# Patient Record
Sex: Female | Born: 1945
Health system: Southern US, Community
[De-identification: ages and names within clinical notes are randomized; demographics above are authoritative.]

## PROBLEM LIST (undated history)

## (undated) DIAGNOSIS — M1990 Unspecified osteoarthritis, unspecified site: Secondary | ICD-10-CM

## (undated) DIAGNOSIS — C4490 Unspecified malignant neoplasm of skin, unspecified: Secondary | ICD-10-CM

## (undated) DIAGNOSIS — Z8719 Personal history of other diseases of the digestive system: Secondary | ICD-10-CM

## (undated) DIAGNOSIS — I2510 Atherosclerotic heart disease of native coronary artery without angina pectoris: Secondary | ICD-10-CM

## (undated) DIAGNOSIS — E785 Hyperlipidemia, unspecified: Secondary | ICD-10-CM

## (undated) DIAGNOSIS — Z720 Tobacco use: Secondary | ICD-10-CM

## (undated) DIAGNOSIS — K8020 Calculus of gallbladder without cholecystitis without obstruction: Secondary | ICD-10-CM

## (undated) DIAGNOSIS — F419 Anxiety disorder, unspecified: Secondary | ICD-10-CM

## (undated) DIAGNOSIS — C801 Malignant (primary) neoplasm, unspecified: Secondary | ICD-10-CM

## (undated) DIAGNOSIS — T7840XA Allergy, unspecified, initial encounter: Secondary | ICD-10-CM

## (undated) DIAGNOSIS — E042 Nontoxic multinodular goiter: Secondary | ICD-10-CM

## (undated) DIAGNOSIS — N183 Chronic kidney disease, stage 3 unspecified: Secondary | ICD-10-CM

## (undated) DIAGNOSIS — K219 Gastro-esophageal reflux disease without esophagitis: Secondary | ICD-10-CM

## (undated) DIAGNOSIS — K449 Diaphragmatic hernia without obstruction or gangrene: Secondary | ICD-10-CM

## (undated) DIAGNOSIS — M419 Scoliosis, unspecified: Secondary | ICD-10-CM

## (undated) DIAGNOSIS — F329 Major depressive disorder, single episode, unspecified: Secondary | ICD-10-CM

## (undated) DIAGNOSIS — M810 Age-related osteoporosis without current pathological fracture: Secondary | ICD-10-CM

## (undated) DIAGNOSIS — I6523 Occlusion and stenosis of bilateral carotid arteries: Secondary | ICD-10-CM

## (undated) DIAGNOSIS — F32A Depression, unspecified: Secondary | ICD-10-CM

## (undated) DIAGNOSIS — C50919 Malignant neoplasm of unspecified site of unspecified female breast: Secondary | ICD-10-CM

## (undated) DIAGNOSIS — Z85828 Personal history of other malignant neoplasm of skin: Secondary | ICD-10-CM

## (undated) DIAGNOSIS — I10 Essential (primary) hypertension: Secondary | ICD-10-CM

## (undated) DIAGNOSIS — Z972 Presence of dental prosthetic device (complete) (partial): Secondary | ICD-10-CM

## (undated) HISTORY — PX: EYE SURGERY: SHX253

## (undated) HISTORY — DX: Malignant (primary) neoplasm, unspecified: C80.1

## (undated) HISTORY — DX: Nontoxic multinodular goiter: E04.2

## (undated) HISTORY — DX: Gastro-esophageal reflux disease without esophagitis: K21.9

## (undated) HISTORY — DX: Unspecified malignant neoplasm of skin, unspecified: C44.90

## (undated) HISTORY — PX: HAND SURGERY: SHX662

## (undated) HISTORY — DX: Chronic kidney disease, stage 3 unspecified: N18.30

## (undated) HISTORY — PX: COLONOSCOPY: SHX174

## (undated) HISTORY — DX: Atherosclerotic heart disease of native coronary artery without angina pectoris: I25.10

## (undated) HISTORY — DX: Age-related osteoporosis without current pathological fracture: M81.0

## (undated) HISTORY — DX: Unspecified osteoarthritis, unspecified site: M19.90

## (undated) HISTORY — DX: Hyperlipidemia, unspecified: E78.5

## (undated) HISTORY — DX: Occlusion and stenosis of bilateral carotid arteries: I65.23

## (undated) HISTORY — DX: Depression, unspecified: F32.A

## (undated) HISTORY — PX: TUBAL LIGATION: SHX77

## (undated) HISTORY — DX: Allergy, unspecified, initial encounter: T78.40XA

## (undated) HISTORY — DX: Personal history of other malignant neoplasm of skin: Z85.828

## (undated) HISTORY — PX: TONSILECTOMY, ADENOIDECTOMY, BILATERAL MYRINGOTOMY AND TUBES: SHX2538

## (undated) HISTORY — DX: Tobacco use: Z72.0

## (undated) HISTORY — DX: Essential (primary) hypertension: I10

## (undated) HISTORY — DX: Major depressive disorder, single episode, unspecified: F32.9

---

## 2002-10-27 ENCOUNTER — Other Ambulatory Visit: Admission: RE | Admit: 2002-10-27 | Discharge: 2002-10-27 | Payer: Self-pay | Admitting: Obstetrics and Gynecology

## 2003-12-03 ENCOUNTER — Ambulatory Visit (HOSPITAL_COMMUNITY): Admission: RE | Admit: 2003-12-03 | Discharge: 2003-12-03 | Payer: Self-pay | Admitting: Endocrinology

## 2004-12-15 ENCOUNTER — Other Ambulatory Visit: Admission: RE | Admit: 2004-12-15 | Discharge: 2004-12-15 | Payer: Self-pay | Admitting: Obstetrics and Gynecology

## 2005-11-21 ENCOUNTER — Ambulatory Visit: Payer: Self-pay

## 2005-11-27 ENCOUNTER — Ambulatory Visit: Payer: Self-pay

## 2007-01-02 ENCOUNTER — Ambulatory Visit (HOSPITAL_COMMUNITY): Admission: RE | Admit: 2007-01-02 | Discharge: 2007-01-02 | Payer: Self-pay | Admitting: Endocrinology

## 2007-01-02 ENCOUNTER — Ambulatory Visit: Payer: Self-pay | Admitting: Vascular Surgery

## 2008-09-06 DIAGNOSIS — Z85828 Personal history of other malignant neoplasm of skin: Secondary | ICD-10-CM | POA: Insufficient documentation

## 2008-09-06 DIAGNOSIS — I10 Essential (primary) hypertension: Secondary | ICD-10-CM | POA: Insufficient documentation

## 2008-09-07 ENCOUNTER — Ambulatory Visit: Payer: Self-pay | Admitting: Critical Care Medicine

## 2008-09-07 DIAGNOSIS — J449 Chronic obstructive pulmonary disease, unspecified: Secondary | ICD-10-CM | POA: Insufficient documentation

## 2008-09-09 DIAGNOSIS — J310 Chronic rhinitis: Secondary | ICD-10-CM | POA: Insufficient documentation

## 2008-09-14 ENCOUNTER — Encounter: Payer: Self-pay | Admitting: Cardiology

## 2008-09-14 ENCOUNTER — Ambulatory Visit: Payer: Self-pay | Admitting: Cardiology

## 2008-09-16 ENCOUNTER — Ambulatory Visit: Payer: Self-pay | Admitting: Cardiology

## 2008-09-16 LAB — CONVERTED CEMR LAB
BUN: 13 mg/dL (ref 6–23)
Basophils Absolute: 0 10*3/uL (ref 0.0–0.1)
Basophils Relative: 0.2 % (ref 0.0–3.0)
CO2: 30 meq/L (ref 19–32)
Calcium: 9.2 mg/dL (ref 8.4–10.5)
Chloride: 106 meq/L (ref 96–112)
Creatinine, Ser: 0.8 mg/dL (ref 0.4–1.2)
Eosinophils Absolute: 0.1 10*3/uL (ref 0.0–0.7)
Eosinophils Relative: 1.3 % (ref 0.0–5.0)
GFR calc non Af Amer: 76.57 mL/min (ref >60)
Glucose, Bld: 99 mg/dL (ref 70–99)
HCT: 42.3 % (ref 36.0–46.0)
Hemoglobin: 14.3 g/dL (ref 12.0–15.0)
INR: 1 (ref 0.8–1.0)
Lymphocytes Relative: 37.1 % (ref 12.0–46.0)
Lymphs Abs: 2.9 10*3/uL (ref 0.7–4.0)
MCHC: 33.8 g/dL (ref 30.0–36.0)
MCV: 96.8 fL (ref 78.0–100.0)
Monocytes Absolute: 0.5 10*3/uL (ref 0.1–1.0)
Monocytes Relative: 5.8 % (ref 3.0–12.0)
Neutro Abs: 4.4 10*3/uL (ref 1.4–7.7)
Neutrophils Relative %: 55.6 % (ref 43.0–77.0)
Platelets: 247 10*3/uL (ref 150.0–400.0)
Potassium: 4.4 meq/L (ref 3.5–5.1)
Prothrombin Time: 11.1 s (ref 10.9–13.3)
RBC: 4.37 M/uL (ref 3.87–5.11)
RDW: 13.6 % (ref 11.5–14.6)
Sodium: 143 meq/L (ref 135–145)
WBC: 7.9 10*3/uL (ref 4.5–10.5)

## 2008-09-20 ENCOUNTER — Ambulatory Visit: Payer: Self-pay | Admitting: Cardiology

## 2008-09-20 ENCOUNTER — Inpatient Hospital Stay (HOSPITAL_BASED_OUTPATIENT_CLINIC_OR_DEPARTMENT_OTHER): Admission: RE | Admit: 2008-09-20 | Discharge: 2008-09-20 | Payer: Self-pay | Admitting: Cardiology

## 2008-09-21 ENCOUNTER — Telehealth: Payer: Self-pay | Admitting: Cardiology

## 2008-09-28 ENCOUNTER — Encounter: Payer: Self-pay | Admitting: Cardiology

## 2008-09-28 ENCOUNTER — Ambulatory Visit: Payer: Self-pay | Admitting: Cardiology

## 2008-09-28 DIAGNOSIS — E785 Hyperlipidemia, unspecified: Secondary | ICD-10-CM | POA: Insufficient documentation

## 2008-09-28 DIAGNOSIS — I2510 Atherosclerotic heart disease of native coronary artery without angina pectoris: Secondary | ICD-10-CM | POA: Insufficient documentation

## 2008-10-07 ENCOUNTER — Ambulatory Visit: Payer: Self-pay | Admitting: Critical Care Medicine

## 2008-11-09 ENCOUNTER — Ambulatory Visit: Payer: Self-pay | Admitting: Cardiology

## 2008-11-16 LAB — CONVERTED CEMR LAB
ALT: 11 units/L (ref 0–35)
AST: 19 units/L (ref 0–37)
Albumin: 3.8 g/dL (ref 3.5–5.2)
Alkaline Phosphatase: 72 units/L (ref 39–117)
Bilirubin, Direct: 0.1 mg/dL (ref 0.0–0.3)
Cholesterol: 132 mg/dL (ref 0–200)
HDL: 47.6 mg/dL (ref >39.00)
LDL Cholesterol: 66 mg/dL (ref 0–99)
Total Bilirubin: 1 mg/dL (ref 0.3–1.2)
Total CHOL/HDL Ratio: 3
Total Protein: 6.9 g/dL (ref 6.0–8.3)
Triglycerides: 91 mg/dL (ref 0.0–149.0)
VLDL: 18.2 mg/dL (ref 0.0–40.0)

## 2009-01-14 ENCOUNTER — Ambulatory Visit: Payer: Self-pay | Admitting: Critical Care Medicine

## 2009-05-31 ENCOUNTER — Encounter: Admission: RE | Admit: 2009-05-31 | Discharge: 2009-05-31 | Payer: Self-pay | Admitting: Endocrinology

## 2009-07-12 ENCOUNTER — Ambulatory Visit: Payer: Self-pay | Admitting: Critical Care Medicine

## 2009-07-12 DIAGNOSIS — S2239XA Fracture of one rib, unspecified side, initial encounter for closed fracture: Secondary | ICD-10-CM | POA: Insufficient documentation

## 2009-10-04 ENCOUNTER — Encounter: Admission: RE | Admit: 2009-10-04 | Discharge: 2009-10-04 | Payer: Self-pay | Admitting: Endocrinology

## 2009-10-04 ENCOUNTER — Ambulatory Visit: Payer: Self-pay | Admitting: Cardiology

## 2009-10-04 DIAGNOSIS — F17200 Nicotine dependence, unspecified, uncomplicated: Secondary | ICD-10-CM | POA: Insufficient documentation

## 2010-04-19 ENCOUNTER — Encounter: Admission: RE | Admit: 2010-04-19 | Discharge: 2010-04-19 | Payer: Self-pay | Admitting: Endocrinology

## 2010-07-11 NOTE — Assessment & Plan Note (Signed)
Summary: Pulmonary OV   Copy to:  Dr. Marcelle Overlie Primary Ryan Palermo/Referring Zai Chmiel:  Dr. Corrin Parker  CC:  3 month followup.  Pt c/o dry cough in the am and drainage at bedtime x 1 wk.  She states that breathing is a little better.  Marland Kitchen  History of Present Illness: Pulmonary OV       This is a 65 year old woman with Golds stage III COPD   January 14, 2009 4:38 PM Doing fair except notes pn drip.  Has a dry cough but no edema in feet.  Overall is at baseline. Pt denies any significant sore throat, nasal congestion or excess secretions, fever, chills, sweats, unintended weight loss, pleurtic or exertional chest pain, orthopnea PND, or leg swelling Pt denies any increase in rescue therapy over baseline, denies waking up needing it or having any early am or nocturnal exacerbations of coughing/wheezing/or dyspnea.  October 07, 2008 3:47 PM We started spiriva at last ov.  Pt had heart cath.  No stents placed.  Now exercising more.  No new issues. less dyspnea.     Preventive Screening-Counseling & Management  Alcohol-Tobacco     Smoking Status: quit > 6 months  Current Medications (verified): 1)  Crestor 20 Mg Tabs (Rosuvastatin Calcium) .... Take One Tablet By Mouth Daily. 2)  Inderal La 80 Mg Xr24h-Cap (Propranolol Hcl) .... 1/2 By Mouth Daily 3)  Maxzide-25 37.5-25 Mg Tabs (Triamterene-Hctz) .... 1/2 Tab Every Other Day 4)  Clonazepam 0.5 Mg Tabs (Clonazepam) .Marland Kitchen.. 1 By Mouth Daily 5)  Prilosec Otc 20 Mg Tbec (Omeprazole Magnesium) .Marland Kitchen.. 1 Once Daily 6)  Spiriva Handihaler 18 Mcg  Caps (Tiotropium Bromide Monohydrate) .... Two Puffs in Handihaler Daily 7)  Lexapro 10 Mg Tabs (Escitalopram Oxalate) .Marland Kitchen.. 1 Tab Once Daily 8)  Amlodipine Besylate 5 Mg Tabs (Amlodipine Besylate) .... 1/2 Tab Once Daily 9)  Multivitamins   Tabs (Multiple Vitamin) .Marland Kitchen.. 1 Tab Once Daily  Allergies (verified): 1)  ! Pcn 2)  ! Sulfa 3)  ! Flagyl 4)  ! Ceclor 5)  ! Naprosyn  Past  History:  Past medical, surgical, family and social histories (including risk factors) reviewed, and no changes noted (except as noted below).  Past Medical History: Reviewed history from 09/24/2008 and no changes required. HYPERTENSION (ICD-401.9) EMPHYSEMA (ICD-492.8) PAINFUL RESPIRATION (ICD-786.52) SKIN CANCER, HX OF (ICD-V10.83) ALLERGIC RHINITIS (ICD-477.9)    Past Surgical History: Reviewed history from 09/07/2008 and no changes required. Tubal Ligation--1974 Tonsillectomy  Family History: Reviewed history from 09/07/2008 and no changes required. Heart Disease--5 brothers and 2 sisters Arthritis--mother Diverticulosis--sister Pancreatic cancer  Social History: Reviewed history from 09/07/2008 and no changes required. Married Patient states former smoker.  retired Merchandiser, retail trucking [employer]  Review of Systems       The patient complains of shortness of breath with activity, non-productive cough, and nasal congestion/difficulty breathing through nose.  The patient denies shortness of breath at rest, productive cough, coughing up blood, chest pain, irregular heartbeats, acid heartburn, indigestion, loss of appetite, weight change, abdominal pain, difficulty swallowing, sore throat, tooth/dental problems, headaches, sneezing, itching, ear ache, anxiety, depression, hand/feet swelling, joint stiffness or pain, rash, change in color of mucus, and fever.    Vital Signs:  Patient profile:   65 year old female Weight:      175.25 pounds O2 Sat:      100 % on Room air Temp:     98.2 degrees F oral Pulse rate:   60 / minute  BP sitting:   122 / 70  (left arm)  Vitals Entered By: Vernie Murders (January 14, 2009 4:16 PM)  O2 Flow:  Room air  Physical Exam  Additional Exam:  Gen: Pleasant, well-nourished, in no distress,  normal affect ENT: No lesions,  mouth clear,  oropharynx clear, no postnasal drip Neck: No JVD, no TMG, no carotid bruits Lungs: No use of accessory muscles,  tender to palpation L chest wall,  distant BS,  prolonged expiratory phase,  no wheeze or rales Cardiovascular: RRR, heart sounds normal, no murmur or gallops, no peripheral edema Abdomen: soft and NT, no HSM,  BS normal Musculoskeletal: No deformities, no cyanosis or clubbing Neuro: alert, non focal Skin: Warm, no lesions or rashes    Impression & Recommendations:  Problem # 1:  EMPHYSEMA (ICD-492.8) Assessment Unchanged Copd Golds stage III, associated chronic rhinitis  plan cont current inhaled medications  trial veramyst two sprays ea nostril daily  no need for supp oxygen at this time   Medications Added to Medication List This Visit: 1)  Prilosec Otc 20 Mg Tbec (Omeprazole magnesium) .Marland Kitchen.. 1 once daily  Complete Medication List: 1)  Crestor 20 Mg Tabs (Rosuvastatin calcium) .... Take one tablet by mouth daily. 2)  Inderal La 80 Mg Xr24h-cap (Propranolol hcl) .... 1/2 by mouth daily 3)  Maxzide-25 37.5-25 Mg Tabs (Triamterene-hctz) .... 1/2 tab every other day 4)  Clonazepam 0.5 Mg Tabs (Clonazepam) .Marland Kitchen.. 1 by mouth daily 5)  Prilosec Otc 20 Mg Tbec (Omeprazole magnesium) .Marland Kitchen.. 1 once daily 6)  Spiriva Handihaler 18 Mcg Caps (Tiotropium bromide monohydrate) .... Two puffs in handihaler daily 7)  Lexapro 10 Mg Tabs (Escitalopram oxalate) .Marland Kitchen.. 1 tab once daily 8)  Amlodipine Besylate 5 Mg Tabs (Amlodipine besylate) .... 1/2 tab once daily 9)  Multivitamins Tabs (Multiple vitamin) .Marland Kitchen.. 1 tab once daily  Other Orders: Est. Patient Level IV (16109)  Patient Instructions: 1)  Return 4 months 2)  Trial veramyst two puff daily each nostril 3)  No change in medications Prescriptions: SPIRIVA HANDIHALER 18 MCG  CAPS (TIOTROPIUM BROMIDE MONOHYDRATE) Two puffs in handihaler daily Brand medically necessary #90 x 4   Entered and Authorized by:   Storm Frisk MD   Signed by:   Storm Frisk MD on 01/14/2009   Method used:   Faxed to ...       [facility]* (mail-order)              ,          Ph: [phone]       Fax: [phone]   RxID:   [id number]   Appended Document: Pulmonary OV fax [place] Pert

## 2010-07-11 NOTE — Assessment & Plan Note (Signed)
Summary: Pulmonary OV   Copy to:  Dr. Marcelle Overlie Primary Provider/Referring Provider:  Dr. Corrin Parker  CC:  1 month follow-up with PFT's and .  Pt still c/o sob with exertion.Marland Kitchen  History of Present Illness: Pulmonary Consultation       This is a 65 year old woman with Golds stage III COPD This pt originally fell out of her bed 7/09 hitting L side of chest wall.  Had severe pain and rib fx seen on xray.  Spirometry then was abn.  No meds given.  Pt then stopped smoking 10/09.  Over time has developed more dyspnea with exertion not at rest.  Worse up hill or steps.  Denies any cough.  Legs are heavy.  Just got over a spell of sinusitis and bronchitis rx with zpak.    Currently:  dyspneic up steps, if plays wiht grandchildren, walking level ground > 49yrds.  Still pain L side of chest if cough, twist or turn.  Cannot sleep with L side down.  No mucous now.    October 07, 2008 3:47 PM We started spiriva at last ov.  Pt had heart cath.  No stents placed.  Now exercising more.  No new issues. less dyspnea.    Current Medications (verified): 1)  Crestor 20 Mg Tabs (Rosuvastatin Calcium) .... Take One Tablet By Mouth Daily. 2)  Inderal La 80 Mg Xr24h-Cap (Propranolol Hcl) .... 1/2 By Mouth Daily 3)  Maxzide-25 37.5-25 Mg Tabs (Triamterene-Hctz) .... 1/2 Tab Every Other Day 4)  Clonazepam 0.5 Mg Tabs (Clonazepam) .Marland Kitchen.. 1 By Mouth Daily 5)  Zegerid 40-1100 Mg Caps (Omeprazole-Sodium Bicarbonate) .Marland Kitchen.. 1 By Mouth Daily 6)  Spiriva Handihaler 18 Mcg  Caps (Tiotropium Bromide Monohydrate) .... Two Puffs in Handihaler Daily 7)  Lexapro 10 Mg Tabs (Escitalopram Oxalate) .Marland Kitchen.. 1 Tab Once Daily 8)  Amlodipine Besylate 5 Mg Tabs (Amlodipine Besylate) .... 1/2 Tab Once Daily 9)  Multivitamins   Tabs (Multiple Vitamin) .Marland Kitchen.. 1 Tab Once Daily  Allergies (verified): 1)  ! Pcn 2)  ! Sulfa 3)  ! Flagyl 4)  ! Ceclor 5)  ! Naprosyn  Past History:  Past Medical History:    Reviewed history  from 09/24/2008 and no changes required:    HYPERTENSION (ICD-401.9)    EMPHYSEMA (ICD-492.8)    PAINFUL RESPIRATION (ICD-786.52)    SKIN CANCER, HX OF (ICD-V10.83)    ALLERGIC RHINITIS (ICD-477.9)       Review of Systems       The patient complains of shortness of breath with activity and shortness of breath at rest.  The patient denies productive cough, non-productive cough, coughing up blood, chest pain, irregular heartbeats, acid heartburn, indigestion, loss of appetite, weight change, abdominal pain, difficulty swallowing, sore throat, tooth/dental problems, headaches, nasal congestion/difficulty breathing through nose, sneezing, itching, ear ache, anxiety, depression, hand/feet swelling, joint stiffness or pain, rash, change in color of mucus, and fever.    Vital Signs:  Patient profile:   65 year old female Height:      62 inches (157.48 cm) Weight:      170 pounds (77.27 kg) BMI:     31.21 O2 Sat:      95 % Temp:     98.0 degrees F (36.67 degrees C) oral Pulse rate:   61 / minute BP sitting:   130 / 80  (left arm) Cuff size:   regular  Vitals Entered By: Michel Bickers CMA (October 07, 2008 3:43 PM)  O2 Sat at  Rest %:  95 O2 Flow:  room air  Physical Exam  Additional Exam:  Gen: Pleasant, well-nourished, in no distress,  normal affect ENT: No lesions,  mouth clear,  oropharynx clear, no postnasal drip Neck: No JVD, no TMG, no carotid bruits Lungs: No use of accessory muscles, tender to palpation L chest wall,  distant BS,  prolonged expiratory phase,  no wheeze or rales Cardiovascular: RRR, heart sounds normal, no murmur or gallops, no peripheral edema Abdomen: soft and NT, no HSM,  BS normal Musculoskeletal: No deformities, no cyanosis or clubbing Neuro: alert, non focal Skin: Warm, no lesions or rashes    Pulmonary Function Test Date: 10/07/2008 Gender: Female  Pre-Spirometry FVC    Value: 2.32 L/min   Pred: 2.74 L/min     % Pred: 85 % FEV1    Value: 1.72 L      Pred: 1.99 L     % Pred: 87 % FEV1/FVC  Value: 74 %     Pred: 73 %    FEF 25-75  Value: 1.26 L/min   Pred: 2.35 L/min     % Pred: 53 %  Post-Spirometry FVC    Value: 2.27 L/min   Pred: 2.74 L/min     % Pred: 83 % FEV1    Value: 1.67 L     Pred: 1.99 L     % Pred: 84 % FEV1/FVC  Value: 73 %     Pred: 73 %    FEF 25-75  Value: 1.18 L/min   Pred: 2.35 L/min     % Pred: 50 %  Lung Volumes TLC    Value: 4.08 L   % Pred: 92 % RV    Value: 1.76 L   % Pred: 103 % DLCO    Value: 15.2 %   % Pred: 74 % DLCO/VA  Value: 4.13 %   % Pred: 113 %  Comments: moderate peripheral airflow obstruction,  mild reduction in DLCO  Impression & Recommendations:  Problem # 1:  EMPHYSEMA (ICD-492.8) Assessment Unchanged Copd Golds stage III  plan cont current inhaled medications  no need for supp oxygen at this time   Complete Medication List: 1)  Crestor 20 Mg Tabs (Rosuvastatin calcium) .... Take one tablet by mouth daily. 2)  Inderal La 80 Mg Xr24h-cap (Propranolol hcl) .... 1/2 by mouth daily 3)  Maxzide-25 37.5-25 Mg Tabs (Triamterene-hctz) .... 1/2 tab every other day 4)  Clonazepam 0.5 Mg Tabs (Clonazepam) .Marland Kitchen.. 1 by mouth daily 5)  Zegerid 40-1100 Mg Caps (Omeprazole-sodium bicarbonate) .Marland Kitchen.. 1 by mouth daily 6)  Spiriva Handihaler 18 Mcg Caps (Tiotropium bromide monohydrate) .... Two puffs in handihaler daily 7)  Lexapro 10 Mg Tabs (Escitalopram oxalate) .Marland Kitchen.. 1 tab once daily 8)  Amlodipine Besylate 5 Mg Tabs (Amlodipine besylate) .... 1/2 tab once daily 9)  Multivitamins Tabs (Multiple vitamin) .Marland Kitchen.. 1 tab once daily  Other Orders: Est. Patient Level III (42595)  Patient Instructions: 1)  No change in medications 2)  Return 4 months  Appended Document: Pulmonary OV fax Corrin Parker and Marcelle Overlie

## 2010-07-11 NOTE — Assessment & Plan Note (Signed)
Summary: Review Paper Chart for Dictation   CC:  Occasion Chest Pain/Pressure: DOE.  Current Medications (verified): 1)  Crestor 5 Mg Tabs (Rosuvastatin Calcium) .Marland Kitchen.. 1 By Mouth Daily 2)  Inderal La 80 Mg Xr24h-Cap (Propranolol Hcl) .... 1/2 By Mouth Daily 3)  Maxzide-25 37.5-25 Mg Tabs (Triamterene-Hctz) .... 1/2 By Mouth Daily 4)  Clonazepam 0.5 Mg Tabs (Clonazepam) .Marland Kitchen.. 1 By Mouth Daily 5)  Zegerid 40-1100 Mg Caps (Omeprazole-Sodium Bicarbonate) .Marland Kitchen.. 1 By Mouth Daily 6)  Spiriva Handihaler 18 Mcg  Caps (Tiotropium Bromide Monohydrate) .... Two Puffs in Handihaler Daily  Allergies: 1)  ! Pcn 2)  ! Sulfa 3)  ! Flagyl 4)  ! Ceclor 5)  ! Naprosyn  Vital Signs:  Patient profile:   65 year old female Height:      61 inches Weight:      172 pounds Pulse rate:   56 / minute BP sitting:   149 / 72  (left arm)  Vitals Entered By: Stanton Kidney, EMT-P (September 14, 2008 4:13 PM)

## 2010-07-11 NOTE — Assessment & Plan Note (Signed)
Summary: Pulmonary Consultation   Copy to:  Dr. Marcelle Overlie Primary Provider/Referring Provider:  Dr. Corrin Parker  CC:  Pulmonary consult for dyspnea/emphysema. and COPD initial evaluation.  History of Present Illness: Pulmonary Consultation       This is a 65 year old woman who presents for COPD initial evaluation.  The patient complains of shortness of breath, chest tightness, chest pain worse with breathing and coughing, and mucous production, but denies wheezing, cough, nocturnal awakening, exercise induced symptoms, and congestion.  Prior evaluation and testing has included Cxr and simple PFT.  The dyspnea is described as with walking one or two blocks, with walking stairs, with walking from the car to building, with walking to the mailbox and back, and during the day.  Associated disease(s) include(s) indigestion, sneezing, nasal congestion, chest pain, and chest tightness.  Oxygen evaluation is described as not on supplemental O2.   This pt originally fell out of her bed 7/09 hitting L side of chest wall.  Had severe pain and rib fx seen on xray.  Spirometry then was abn.  No meds given.  Pt then stopped smoking 10/09.  Over time has developed more dyspnea with exertion not at rest.  Worse up hill or steps.  Denies any cough.  Legs are heavy.  Just got over a spell of sinusitis and bronchitis rx with zpak.    Currently:  dyspneic up steps, if plays wiht grandchildren, walking level ground > 85yrds.  Still pain L side of chest if cough, twist or turn.  Cannot sleep with L side down.  No mucous now.  Not on any inhalers now.  Here for pulm consult.    Preventive Screening-Counseling & Management     Smoking Status: quit > 6 months     Year Started: age 11     Year Quit: 2009     Pack years: 21  Current Medications (verified): 1)  Crestor 5 Mg Tabs (Rosuvastatin Calcium) .Marland Kitchen.. 1 By Mouth Daily 2)  Inderal La 80 Mg Xr24h-Cap (Propranolol Hcl) .... 1/2 By Mouth Daily 3)   Maxzide-25 37.5-25 Mg Tabs (Triamterene-Hctz) .... 1/2 By Mouth Daily 4)  Clonazepam 0.5 Mg Tabs (Clonazepam) .Marland Kitchen.. 1 By Mouth Daily 5)  Zegerid 40-1100 Mg Caps (Omeprazole-Sodium Bicarbonate) .Marland Kitchen.. 1 By Mouth Daily  Allergies (verified): 1)  ! Pcn 2)  ! Sulfa 3)  ! Flagyl 4)  ! Ceclor  Past History:  Past Medical History:    Current Problems:     EMPHYSEMA (ICD-492.8)    SKIN CANCER, HX OF (ICD-V10.83)    HYPERTENSION (ICD-401.9)    Allergic Rhinitis  Past Surgical History:    Tubal Ligation--1974    Tonsillectomy  Family History:    Reviewed history from 09/06/2008 and no changes required:       Heart Disease--5 brothers and 2 sisters       Arthritis--mother       Diverticulosis--sister       Pancreatic cancer  Social History:    Reviewed history from 09/06/2008 and no changes required:       Married       Patient states former smoker.        retired Merchandiser, retail trucking [employer]    Smoking Status:  quit > 6 months    Pack years:  21  Review of Systems       The patient complains of shortness of breath with activity, chest pain, acid heartburn, nasal congestion/difficulty breathing through nose, sneezing, anxiety, and hand/feet  swelling.  The patient denies shortness of breath at rest, productive cough, non-productive cough, coughing up blood, irregular heartbeats, indigestion, loss of appetite, weight change, abdominal pain, difficulty swallowing, sore throat, tooth/dental problems, headaches, itching, ear ache, depression, joint stiffness or pain, rash, change in color of mucus, and fever.        See HPI for Pulmonary, Cardiac, General, and ENT review of systems.  Vital Signs:  Patient profile:   65 year old female Height:      62 inches (157.48 cm) Weight:      173.50 pounds (78.86 kg) BMI:     31.85 O2 Sat:      98 % Temp:     98.1 degrees F (36.72 degrees C) oral Pulse rate:   63 / minute BP sitting:   140 / 80  (left arm) Cuff size:   regular  Vitals Entered By:  Michel Bickers CMA (September 07, 2008 8:50 AM)  O2 Sat at Rest %:  98 O2 Flow:  room air CC: Pulmonary consult for dyspnea/emphysema., COPD initial evaluation Is Patient Diabetic? No   Physical Exam  Additional Exam:  Gen: Pleasant, well-nourished, in no distress,  normal affect ENT: No lesions,  mouth clear,  oropharynx clear, no postnasal drip Neck: No JVD, no TMG, no carotid bruits Lungs: No use of accessory muscles, tender to palpation L chest wall,  distant BS,  prolonged expiratory phase,  no wheeze or rales Cardiovascular: RRR, heart sounds normal, no murmur or gallops, no peripheral edema Abdomen: soft and NT, no HSM,  BS normal Musculoskeletal: No deformities, no cyanosis or clubbing Neuro: alert, non focal Skin: Warm, no lesions or rashes    CXR  Procedure date:  09/07/2008  Findings:        LEFT RIBS - 2 VIEW   Comparison: Chest 09/07/2008.   Findings: There is no rib fracture.  Degenerative disease of the left acromioclavicular joint noted.   IMPRESSION: Negative for fracture.  CHEST - 2 VIEW   Comparison: None   Findings: Heart and mediastinal contours are within normal limits. No focal opacities or effusions.  No acute bony abnormality.   IMPRESSION: No active disease.  Impression & Recommendations:  Problem # 1:  EMPHYSEMA (ICD-492.8) Assessment Deteriorated Severe COPD with primary emphysematous component,  CXR without acute process.  No pfts for eval.    plan: full pfts and 6 minute walk start spiriva  Problem # 2:  PAINFUL RESPIRATION (ZOX-096.04) Assessment: Unchanged Tender chest wall s/p fall,  no overt abn on cxr or rib details, suspect chronic pleurisy/ chest wall inflammation  plan: trial aleve/motrin on schedule  Medications Added to Medication List This Visit: 1)  Zegerid 40-1100 Mg Caps (Omeprazole-sodium bicarbonate) .Marland Kitchen.. 1 by mouth daily 2)  Spiriva Handihaler 18 Mcg Caps (Tiotropium bromide monohydrate) .... Two puffs in  handihaler daily 3)  Neurontin 300 Mg Caps (Gabapentin) .... One by mouth two times a day  Complete Medication List: 1)  Crestor 5 Mg Tabs (Rosuvastatin calcium) .Marland Kitchen.. 1 by mouth daily 2)  Inderal La 80 Mg Xr24h-cap (Propranolol hcl) .... 1/2 by mouth daily 3)  Maxzide-25 37.5-25 Mg Tabs (Triamterene-hctz) .... 1/2 by mouth daily 4)  Clonazepam 0.5 Mg Tabs (Clonazepam) .Marland Kitchen.. 1 by mouth daily 5)  Zegerid 40-1100 Mg Caps (Omeprazole-sodium bicarbonate) .Marland Kitchen.. 1 by mouth daily 6)  Spiriva Handihaler 18 Mcg Caps (Tiotropium bromide monohydrate) .... Two puffs in handihaler daily 7)  Neurontin 300 Mg Caps (Gabapentin) .... One by mouth two times  a day  Other Orders: New Patient Level V [phone]) Prescription Created Electronically [phone]) HFA Instruction [phone]) Pulmonary Referral (Pulmonary) T-Ribs Unilateral 2 Views (71100TC) T-2 View CXR, Same Day (71020.5TC)  Patient Instructions: 1)  Get pulmonary function studies and 6 minute walk test 2)  Chest xray and rib films today 3)  Start Spiriva daily one capsule two puffs daily 4)  Return one month Prescriptions: NEURONTIN 300 MG CAPS (GABAPENTIN) one by mouth two times a day Brand medically necessary #60 x 1   Entered and Authorized by:   Storm Frisk MD   Signed by:   Storm Frisk MD on 09/08/2008   Method used:   Electronically to        [facility]  [address] [phone]* (retail)       [address]       [place], [place]  [address]       Ph: [phone] or [phone]       Fax: [phone]   RxID:   [id number] SPIRIVA HANDIHALER 18 MCG  CAPS (TIOTROPIUM BROMIDE MONOHYDRATE) Two puffs in handihaler daily Brand medically necessary #30 x 6   Entered and Authorized by:   Storm Frisk MD   Signed by:   Storm Frisk MD on 09/07/2008   Method used:   Electronically to        [facility]  [address] [phone]* (retail)       [address]       [place], [place]  [address]       Ph:  [phone] or [phone]       Fax: [phone]   RxID:   [id number]   Appended Document: Pulmonary Consultation fax Marcelle Overlie and Corrin Parker

## 2010-07-11 NOTE — Assessment & Plan Note (Signed)
Summary: Pulmonary OV   Copy to:  Dr. Marcelle Overlie Primary Provider/Referring Provider:  Dr. Corrin Parker  CC:  6 mo follow up.  states she fell approx 1-1/2 mo ago.  having increased SOB with activity since then and still having sharp right sided rib pain when breathing deep or when raising right arm. states she has not been able to use symbicort properly since the fall.Marland Kitchen  History of Present Illness: Pulmonary OV       This is a 65 year old woman with Golds stage III COPD   January 14, 2009 4:38 PM Doing fair except notes pn drip.  Has a dry cough but no edema in feet.  Overall is at baseline. Pt denies any significant sore throat, nasal congestion or excess secretions, fever, chills, sweats, unintended weight loss, pleurtic or exertional chest pain, orthopnea PND, or leg swelling Pt denies any increase in rescue therapy over baseline, denies waking up needing it or having any early am or nocturnal exacerbations of coughing/wheezing/or dyspnea.  July 12, 2009 11:37 AM 12/21:  fell against metal cabinet and fx ribs.  Was in rib splint.  Rib films actually read as neg.   Still an issue in the chest  If takes a deep breath is painful.  If sneeze,  will hurt.   May have had three weeks ago an excess sneeze attack with more pain. If opens door will hurt. Pt notes  any turning of steering wheel to left it hurts. Pain is better but still present .  Hydrocodone helped.  Was ok pulm wise until the injury .  October 07, 2008 3:47 PM We started spiriva at last ov.  Pt had heart cath.  No stents placed.  Now exercising more.  No new issues. less dyspnea.    Preventive Screening-Counseling & Management  Alcohol-Tobacco     Smoking Status: quit > 6 months  Current Medications (verified): 1)  Crestor 20 Mg Tabs (Rosuvastatin Calcium) .... Take One Tablet By Mouth Daily. 2)  Inderal La 80 Mg Xr24h-Cap (Propranolol Hcl) .... 1/2 By Mouth Daily 3)  Maxzide-25 37.5-25 Mg Tabs  (Triamterene-Hctz) .... 1/2 Tab Every Other Day 4)  Clonazepam 0.5 Mg Tabs (Clonazepam) .Marland Kitchen.. 1 By Mouth Two Times A Day 5)  Prilosec Otc 20 Mg Tbec (Omeprazole Magnesium) .Marland Kitchen.. 1 Once Daily 6)  Spiriva Handihaler 18 Mcg  Caps (Tiotropium Bromide Monohydrate) .... Two Puffs in Handihaler Daily 7)  Lexapro 10 Mg Tabs (Escitalopram Oxalate) .Marland Kitchen.. 1 Tab Once Daily 8)  Amlodipine Besylate 5 Mg Tabs (Amlodipine Besylate) .... 1/2 Tab Once Daily 9)  Multivitamins   Tabs (Multiple Vitamin) .Marland Kitchen.. 1 Tab Once Daily  Allergies (verified): 1)  ! Pcn 2)  ! Sulfa 3)  ! Flagyl 4)  ! Ceclor 5)  ! Naprosyn  Past History:  Past medical, surgical, family and social histories (including risk factors) reviewed, and no changes noted (except as noted below).  Past Medical History: Reviewed history from 09/24/2008 and no changes required. HYPERTENSION (ICD-401.9) EMPHYSEMA (ICD-492.8) PAINFUL RESPIRATION (ICD-786.52) SKIN CANCER, HX OF (ICD-V10.83) ALLERGIC RHINITIS (ICD-477.9)    Past Surgical History: Reviewed history from 09/07/2008 and no changes required. Tubal Ligation--1974 Tonsillectomy  Family History: Reviewed history from 09/07/2008 and no changes required. Heart Disease--5 brothers and 2 sisters Arthritis--mother Diverticulosis--sister Pancreatic cancer  Social History: Reviewed history from 09/07/2008 and no changes required. Married Patient states former smoker.  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  The patient denies shortness of breath at rest, productive cough, non-productive cough, coughing up blood, irregular heartbeats, acid heartburn, indigestion, loss of appetite, weight change, abdominal pain, difficulty swallowing, sore throat, tooth/dental problems, headaches, nasal congestion/difficulty breathing through nose, sneezing, itching, ear ache, anxiety, depression, hand/feet  swelling, joint stiffness or pain, rash, change in color of mucus, and fever.    Vital Signs:  Patient profile:   65 year old female Height:      62 inches Weight:      177 pounds BMI:     32.49 O2 Sat:      100 % on Room air Temp:     97.8 degrees F oral Pulse rate:   66 / minute BP sitting:   128 / 72  (left arm) Cuff size:   regular  Vitals Entered By: Gweneth Dimitri RN (July 12, 2009 11:24 AM)  O2 Flow:  Room air CC: 6 mo follow up.  states she fell approx 1-1/2 mo ago.  having increased SOB with activity since then and still having sharp right sided rib pain when breathing deep or when raising right arm. states she has not been able to use symbicort properly since the fall. Comments Medications reviewed with patient Daytime contact number verified with patient. Gweneth Dimitri RN  July 12, 2009 11:26 AM    Physical Exam  Additional Exam:  Gen: Pleasant, well-nourished, in no distress,  normal affect ENT: No lesions,  mouth clear,  oropharynx clear, no postnasal drip Neck: No JVD, no TMG, no carotid bruits Lungs: No use of accessory muscles, tender to palpation R  chest wall,  distant BS,  prolonged expiratory phase,  no wheeze or rales Cardiovascular: RRR, heart sounds normal, no murmur or gallops, no peripheral edema Abdomen: soft and NT, no HSM,  BS normal Musculoskeletal: No deformities, no cyanosis or clubbing Neuro: alert, non focal Skin: Warm, no lesions or rashes    CXR  Procedure date:  07/12/2009  Findings:      Findings: COPD/emphysema.  Mild chronic bronchitic markings.  No acute pulmonary findings.  Thoracic spine unremarkable.   IMPRESSION: COPD  Findings: Fracture anterior aspect of the right seventh rib. Probable remote fracture of the right sixth rib.   IMPRESSION: Recent fracture right 7th rib.  .  Impression & Recommendations:  Problem # 1:  CLOSED FRACTURE OF ONE RIB (ICD-807.01) Assessment Deteriorated New rib fracture  identified in R 7th rib.  Old healing fracture of R 6th rib. plan cont rib splint refill hydrocodone 10days of scheduled ibuprofen 400mg  qid  Problem # 2:  EMPHYSEMA (ICD-492.8) Assessment: Unchanged Stable COPD but will be affected with rib fracture plan cont spiriva daily  Orders: Est. Patient Level IV (81191) Prescription Created Electronically [phone])  Medications Added to Medication List This Visit: 1)  Clonazepam 0.5 Mg Tabs (Clonazepam) .Marland Kitchen.. 1 by mouth two times a day 2)  Motrin Ib 200 Mg Tabs (Ibuprofen) .... Take two three times daily for 10days then stop 3)  Acetaminophen 650 Mg Supp (Acetaminophen) .... Take one 4 times daily for 7 days then stop 4)  Hydrocodone-acetaminophen 10-325 Mg Tabs (Hydrocodone-acetaminophen) .... Take one every 4-6 hours as needed pain  Complete Medication List: 1)  Crestor 20 Mg Tabs (Rosuvastatin calcium) .... Take one tablet by mouth daily. 2)  Inderal La 80 Mg Xr24h-cap (Propranolol hcl) .... 1/2 by mouth daily 3)  Maxzide-25 37.5-25 Mg Tabs (  Triamterene-hctz) .... 1/2 tab every other day 4)  Clonazepam 0.5 Mg Tabs (Clonazepam) .Marland Kitchen.. 1 by mouth two times a day 5)  Prilosec Otc 20 Mg Tbec (Omeprazole magnesium) .Marland Kitchen.. 1 once daily 6)  Spiriva Handihaler 18 Mcg Caps (Tiotropium bromide monohydrate) .... Two puffs in handihaler daily 7)  Lexapro 10 Mg Tabs (Escitalopram oxalate) .Marland Kitchen.. 1 tab once daily 8)  Amlodipine Besylate 5 Mg Tabs (Amlodipine besylate) .... 1/2 tab once daily 9)  Multivitamins Tabs (Multiple vitamin) .Marland Kitchen.. 1 tab once daily 10)  Motrin Ib 200 Mg Tabs (Ibuprofen) .... Take two three times daily for 10days then stop 11)  Hydrocodone-acetaminophen 10-325 Mg Tabs (Hydrocodone-acetaminophen) .... Take one every 4-6 hours as needed pain  Other Orders: T-2 View CXR (71020TC) T-Ribs Unilateral 2 Views (71100TC)  Patient Instructions: 1)  Use Ibuprofent 400mg  three times daily for 10days on schedule 2)  Use  hydrocodone/acetaminophen 10/325 every 4 hours as needed 3)  Refills on Spiriva sent to [facility] 4)  Obtain rib films today, I will call with results 5)  Return 6 months Prescriptions: HYDROCODONE-ACETAMINOPHEN 10-325 MG TABS (HYDROCODONE-ACETAMINOPHEN) take one every 4-6 hours as needed pain  #30 x 1   Entered and Authorized by:   Storm Frisk MD   Signed by:   Storm Frisk MD on 07/12/2009   Method used:   Print then Give to Patient   RxID:   [id number] SPIRIVA HANDIHALER 18 MCG  CAPS (TIOTROPIUM BROMIDE MONOHYDRATE) Two puffs in handihaler daily Brand medically necessary #90 x 4   Entered and Authorized by:   Storm Frisk MD   Signed by:   Storm Frisk MD on 07/12/2009   Method used:   Electronically to        [facility]* (mail-order)             ,          Ph: [phone]       Fax: [phone]   RxID:   [id number]   Appended Document: Pulmonary OV fax Corrin Parker

## 2010-07-11 NOTE — Progress Notes (Signed)
Summary: question re cath  instructions  Phone Note Call from Patient Call back at Home Phone [phone]   Caller: Patient Reason for Call: Talk to Nurse Summary of Call: cath yesterday,wants to know when can she lay on her side Initial call taken by: ammie del villar  Follow-up for Phone Call        Phone Call Completed Pt is okay to sleep on her side. Follow-up by: Julieta Gutting, RN, BSN,  September 21, 2008 12:31 PM

## 2010-07-11 NOTE — Assessment & Plan Note (Signed)
Summary: six min walk  Nurse Visit   Vital Signs:  Patient profile:   65 year old female Pulse rate:   58 / minute BP sitting:   130 / 70    Medications Prior to Update: 1)  Crestor 20 Mg Tabs (Rosuvastatin Calcium) .... Take One Tablet By Mouth Daily. 2)  Inderal La 80 Mg Xr24h-Cap (Propranolol Hcl) .... 1/2 By Mouth Daily 3)  Maxzide-25 37.5-25 Mg Tabs (Triamterene-Hctz) .... 1/2 Tab Every Other Day 4)  Clonazepam 0.5 Mg Tabs (Clonazepam) .Marland Kitchen.. 1 By Mouth Daily 5)  Zegerid 40-1100 Mg Caps (Omeprazole-Sodium Bicarbonate) .Marland Kitchen.. 1 By Mouth Daily 6)  Spiriva Handihaler 18 Mcg  Caps (Tiotropium Bromide Monohydrate) .... Two Puffs in Handihaler Daily 7)  Lexapro 10 Mg Tabs (Escitalopram Oxalate) .Marland Kitchen.. 1 Tab Once Daily 8)  Amlodipine Besylate 5 Mg Tabs (Amlodipine Besylate) .... 1/2 Tab Once Daily 9)  Multivitamins   Tabs (Multiple Vitamin) .Marland Kitchen.. 1 Tab Once Daily  Allergies: 1)  ! Pcn 2)  ! Sulfa 3)  ! Flagyl 4)  ! Ceclor 5)  ! Naprosyn     Orders Added: 1)  Pulmonary Stress (6 min walk) [94620] 2)  Pulmonary Stress (6 min walk) [94620]      Six Minute Walk Test Medications taken before test(dose and time): Inderal La 80 Mg Xr24h-Cap (Propranolol Hcl) .... 1/2 By Mouth Daily @0730  Maxzide-25 37.5-25 Mg Tabs (Triamterene-Hctz) .... 1/2 Tab Every Other Day @0730  Lexapro 10 Mg Tabs (Escitalopram Oxalate) .Marland Kitchen.. 1 Tab Once Daily @1130  Amlodipine Besylate 5 Mg Tabs (Amlodipine Besylate) .... 1/2 Tab Once Daily @0730  Supplemental oxygen during the test: No  Lap counter(place a tick mark inside a square for each lap completed) lap 1 complete  lap 2 complete   lap 3 complete   lap 4 complete  lap 5 complete  lap 6 complete  lap 7 complete    Baseline  BP sitting: 130/ 70 Heart rate: 58 Dyspnea ( Borg scale) 2 Fatigue (Borg scale) 0 SPO2 99  End Of Test  BP sitting: 132/ 72 Heart rate: 69 Dyspnea ( Borg scale) 3 Fatigue (Borg scale) 0 SPO2 99  2 Minutes post  BP  sitting: 132/ 70 Heart rate: 61 SPO2 100  Stopped or paused before six minutes? No Other symptoms at end of exercise: Leg pain  Interpretation: Number of laps  7 X 48 meters =   336 meters+ final partial lap: 30 meters =    366 meters   Total distance walked in six minutes: 366 meters  Tech ID: Tivis Ringer (October 07, 2008 3:55 PM) Tech Comments pt completed test w/ 0 rest breaks and 1 complaint: leg pain which pt states is normal when walking.

## 2010-07-11 NOTE — Miscellaneous (Signed)
Summary: Orders Update pft charges  Clinical Lists Changes  Orders: Added new Service order of Carbon Monoxide diffusing w/capacity (94720) - Signed Added new Service order of Lung Volumes (94240) - Signed Added new Service order of Spirometry (Pre & Post) (94060) - Signed 

## 2010-07-11 NOTE — Assessment & Plan Note (Signed)
Summary: yearly/sl   Visit Type:  Follow-up Referring Provider:  Dr. Marcelle Overlie Primary Provider:  Dr. Corrin Parker  CC:  no cardiac complaints.  History of Present Illness: Brittany Kirk returns today for evaluation and management of her coronary artery disease. She was catheterized in April of 2000 and showed high-grade disease in a trifurcation lesion of the LAD diagonal and ramus. It was felt to be only amenable surgery or medical therapy. Her she done remarkably well on medical therapy. She is having no angina or ischemic symptoms.  She seems to comply with her medications. Unfortunately, she still smokes 3 cigarettes a day.  She denies any symptoms of TIAs or mini strokes. She's had no symptoms of claudication.  Her blood work is checked by her primary care doctor  Dr Dagoberto Ligas.  Clinical Reports Reviewed:  Cardiac Cath:  09/20/2008: Cardiac Cath Findings:   CONCLUSIONS:   1. Preserved left ventricular function.   2. Moderate calcification of the coronary arteries.   3. High-grade ostial ramus intermedius at the trifurcation location       involving the LAD, ramus, and AV circumflex.      DISPOSITION:   1. We will have the patient follow up with Dr. Daleen Squibb in the office.   2. We will initiate medical therapy.  The lesion is not ideal for       percutaneous intervention given its ostial location, and its       relationship to the LAD circumflex interface.  Compression of both       of these vessels is likely with percutaneous intervention.  The       disease does not appear to be extensive enough to recommend the       revascularization surgery at this point.  The initiation of medical       therapy would seem to be the optimal approach.  She is on beta       blockade, and also on cholesterol-lowering agents.  She has stopped       smoking.  Low-dose Imdur would also be considered.               Brittany Kirk. Brittany Kill, MD, [facility]   Electronically Signed    Current  Medications (verified): 1)  Crestor 20 Mg Tabs (Rosuvastatin Calcium) .... Take One Tablet By Mouth Daily. 2)  Inderal La 80 Mg Xr24h-Cap (Propranolol Hcl) .... 1/2 By Mouth Daily 3)  Maxzide-25 37.5-25 Mg Tabs (Triamterene-Hctz) .... 1/2 Tab Every Other Day 4)  Clonazepam 0.5 Mg Tabs (Clonazepam) .Marland Kitchen.. 1 By Mouth in The Morning and 2 At Bedtime 5)  Prilosec Otc 20 Mg Tbec (Omeprazole Magnesium) .Marland Kitchen.. 1 Tab Two Times A Day 6)  Lexapro 10 Mg Tabs (Escitalopram Oxalate) .Marland Kitchen.. 1 Tab Once Daily 7)  Amlodipine Besylate 5 Mg Tabs (Amlodipine Besylate) .... 1/2 Tab Once Daily 8)  Multivitamins   Tabs (Multiple Vitamin) .Marland Kitchen.. 1 Tab Once Daily 9)  Hydrocodone-Acetaminophen 10-325 Mg Tabs (Hydrocodone-Acetaminophen) .... Take One Every 4-6 Hours As Needed Pain  Allergies (verified): 1)  ! Pcn 2)  ! Sulfa 3)  ! Flagyl 4)  ! Ceclor 5)  ! Naprosyn  Past History:  Past Medical History: Last updated: 09/24/2008 HYPERTENSION (ICD-401.9) EMPHYSEMA (ICD-492.8) PAINFUL RESPIRATION (ICD-786.52) SKIN CANCER, HX OF (ICD-V10.83) ALLERGIC RHINITIS (ICD-477.9)    Past Surgical History: Last updated: 09/07/2008 Tubal Ligation--1974 Tonsillectomy  Family History: Last updated: 09/07/2008 Heart Disease--5 brothers and 2 sisters Arthritis--mother Diverticulosis--sister Pancreatic cancer  Social History: Last  updated: 09/07/2008 Married Patient states former smoker.  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   Head:  normocephalic and atraumatic Eyes:  PERRLA/EOM intact;  conjunctiva and lids normal. Mouth:  Teeth, gums and palate normal. Oral mucosa normal. Neck:  Neck supple, no JVD. No masses, thyromegaly or abnormal cervical nodes. Chest Johnanna Bakke:  no deformities or breast masses noted Lungs:  decreased breath sounds throughout Heart:  Non-displaced PMI, chest non-tender; regular rate and rhythm, S1, S2 without murmurs, rubs or gallops. Carotid upstroke normal, no bruit. Normal abdominal aortic size, no bruits. Femorals normal pulses, no bruits. Pedals normal pulses. No edema, no varicosities. Abdomen:  Bowel sounds positive; abdomen soft and non-tender without masses, organomegaly, or hernias noted. No hepatosplenomegaly. Msk:  Back normal, normal gait. Muscle strength and tone normal. Pulses:  diminished but present in both dorsalis pedis and posterior tibial Extremities:  dependent rubor, toes are cool with decreased capillary refill Neurologic:  Alert and oriented x 3. Skin:  Intact without lesions or rashes. Psych:  Normal affect.   EKG  Procedure date:  10/04/2009  Findings:      sinus bradycardia, new T-wave inversion in aVL, otherwise unchanged  Impression & Recommendations:  Problem # 1:  CAD, NATIVE VESSEL (ICD-414.01) Assessment Unchanged  Her updated medication list for this problem includes:    Inderal La 80 Mg Xr24h-cap (Propranolol hcl) .Marland Kitchen... 1/2 by mouth daily    Amlodipine Besylate 5 Mg Tabs (Amlodipine besylate) .Marland Kitchen... 1/2 tab once daily  Orders: EKG w/ Interpretation (93000)  Problem # 2:  HYPERTENSION (ICD-401.9) Assessment: Unchanged  Her updated medication list for this problem includes:    Inderal La 80 Mg Xr24h-cap (Propranolol hcl) .Marland Kitchen... 1/2 by mouth daily    Maxzide-25 37.5-25 Mg Tabs (Triamterene-hctz) .Marland Kitchen... 1/2 tab every other day    Amlodipine Besylate 5 Mg Tabs (Amlodipine besylate) .Marland Kitchen... 1/2 tab once daily  Problem # 3:  HYPERLIPIDEMIA TYPE IIB / III (ICD-272.2)  Her updated medication list for this problem  includes:    Crestor 20 Mg Tabs (Rosuvastatin calcium) .Marland Kitchen... Take one tablet by mouth daily.  Problem # 4:  TOBACCO USER (ICD-305.1) I have advised to quit.  Patient Instructions: 1)  Your physician recommends that you schedule a follow-up appointment in: year with dr Jordy Hewins 2)  Your physician recommends that you continue on your current medications as directed. Please refer to the Current Medication list given to you today.

## 2010-10-24 NOTE — H&P (Signed)
Brittany Kirk, Brittany Kirk              ACCOUNT NO.:  [id number]   MEDICAL RECORD NO.:  [id number]          PATIENT TYPE:  OIB   LOCATION:  1966                         FACILITY:  [facility]   PHYSICIAN:  Arturo Morton. Riley Kill, MD, FACCDATE OF BIRTH:  15-Feb-1946   DATE OF ADMISSION:  09/20/2008  DATE OF DISCHARGE:  09/20/2008                              HISTORY & PHYSICAL   PRIMARY CARDIOLOGIST:  Maisie Fus C. Daleen Squibb, MD, [facility]   PRIMARY CARE PHYSICIAN:  Alfonse Alpers. Dagoberto Ligas, MD   PULMONOLOGIST:  Charlcie Cradle. Delford Field, MD, FCCP   HISTORY OF PRESENT ILLNESS:  This is a 65 year old Caucasian female who  was seen by Dr. Daleen Squibb on September 14, 2008, with complaints of shortness of  breath, chest pain, and evidence of angina-like symptoms.  The patient  had been seen by Dr. Delford Field in March 2010 with complaints of shortness  of breath and dyspnea on exertion with followup PFTs, which revealed  some emphysema.  The patient is complaining of substernal chest pain  radiating to her left armpit, left breast, to left rib.  The patient has  occasional substernal chest discomfort as well at rest, feeling like a  balloon being inflated.  The patient was seen by Dr. Daleen Squibb, who  suggested that the patient have a cardiac catheterization and it was  scheduled for today.  The patient's EKG was completed in the office.  EKG revealing normal sinus rhythm, sinus bradycardia without evidence of  ischemia.  The patient has a strong family history of CAD and the  patient was advised by Dr. Daleen Squibb to proceed with catheterization.   PAST MEDICAL HISTORY:  1. Hypertension.  2. 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   SOCIAL:  The patient has a 30-pack year smoking, but quit in October  2009, occasional alcohol.  The patient is married, with children.   PAST SURGICAL HISTORY:  Tonsillectomy with bilateral tubal ligation and  oral surgery.   FAMILY HISTORY:  Significant for CAD.  Brother at  age 69 died of an MI.  Brother at age 22 died of an MI.  Brother at age 71 died of an MI.  Sister at age 63 died of an MI.  Sister at age 20 died of an MI.  Has 1  brother who had a stent placement.  All deaths were sudden.   CURRENT MEDICATIONS:  1. Crestor 5 mg daily.  2. Inderal 80 mg one-half tablet daily.  3. Maxzide 25/37.5 one-half tablet daily.  4. Clonazepam 0.5 mg daily.  5. Zegerid 40/1100 mg daily.   ALLERGIES:  NAPROSYN, PENICILLIN, SULFA, CECLOR, and FLAGYL.   CURRENT LABORATORIES:  Dated September 14, 2008, sodium 143, potassium 4.4,  chloride 106, CO2 of 30, glucose 99, BUN 13, and creatinine 0.8.  PT  11.1 and INR 1.0.  Hemoglobin 14.3, hematocrit 42.3, white blood cell  7.9, and platelets 247.   PHYSICAL EXAMINATION:  VITAL SIGNS:  Blood pressure 170/74, pulse 60,  respirations 16, O2 sat 100% on 2 L, and weight 172 pounds.  HEENT:  Head is  normocephalic and atraumatic.  Eyes, PERRLA.  Mucous  membranes of mouth pink and moist.  Tongue is midline.  NECK:  Supple without JVD or carotid bruits appreciated.  CARDIOVASCULAR:  Regular rate and rhythm without murmurs, rubs, or  gallops.  LUNGS:  Clear to auscultation without wheezes, rales, or rhonchi.  ABDOMEN:  Soft and nontender.  2+ bowel sounds.  EXTREMITIES:  Without clubbing, cyanosis, or edema.  Femoral pulses are  1+ bilaterally without bruits.  SKIN:  Warm and dry.  NEUROLOGIC:  Intact.   IMPRESSION:  1. Chest pain, rule out coronary artery disease.  2. Hypertension.  3. Lewy body disease.   This is a 65 year old Caucasian female we are seeing for cardiac  catheterization secondary to history of chest pain with strong family  history of coronary artery disease.  The risks and benefits of the  catheterization along with procedure have been discussed with the  patient, who verbalizes understanding and is willing to proceed.  The  patient will have more recommendations after cardiac catheterization per  Dr.  Riley Kill with intervention as necessary based upon test results.       Bettey Mare. Lyman Bishop, NP      Arturo Morton. Riley Kill, MD, [facility]  Electronically Signed    KML/MEDQ  D:  09/20/2008  T:  09/21/2008  Job:  [id number]   cc:   Alfonse Alpers. Dagoberto Ligas, M.D.

## 2010-10-24 NOTE — Cardiovascular Report (Signed)
Brittany Kirk, Brittany Kirk              ACCOUNT NO.:  [id number]   MEDICAL RECORD NO.:  [id number]          PATIENT TYPE:  OIB   LOCATION:  1966                         FACILITY:  [facility]   PHYSICIAN:  Arturo Morton. Riley Kill, MD, FACCDATE OF BIRTH:  1946-03-28   DATE OF PROCEDURE:  DATE OF DISCHARGE:                            CARDIAC CATHETERIZATION   INDICATIONS:  [name] is a 65 year old woman who has been followed  by Dr. Dagoberto Ligas for a long period of time.  She has extensively strong  family history of coronary artery disease.  She has developed some  recent chest discomfort.  The current study was done to assess coronary  anatomy after she saw Dr. Daleen Squibb in consultation.   PROCEDURES:  1. Left heart catheterization.  2. Selective coronary arteriography.  3. Selective left ventriculography.   DESCRIPTION OF PROCEDURE:  The patient was brought to the  catheterization laboratory, prepped, and draped in the usual fashion.  Through an anterior puncture, the right femoral artery was easily  entered and a 4-French sheath was placed.  Views of the left and right  coronary arteries were obtained.  We did perform left coronary  arteriography before and after the administration of intracoronary  nitroglycerin.  Central aortic and left ventricular pressure were then  measured with a pigtail and ventriculography was performed in the RAO  projection.  Following the pressure pullback, the pigtail catheter was  removed.  There were no complications.  She was taken to the holding  area in satisfactory clinical condition.   HEMODYNAMIC DATA:  The initial central aortic pressure was elevated.  It  was 196/71 with a mean of 116.  Intravenous labetalol was then  administered to try to bring the blood pressure under control.  Intracoronary nitroglycerin was also given to better identify the  coronary vessels.  LV pressure was 179/22.  There was no gradient on  pullback across the aortic valve.   ANGIOGRAPHIC DATA:  1. On plain fluoroscopy, there was moderate calcification of the      coronary vessels.  They are relatively small in caliber.  2. Ventriculography was done in the RAO projection.  Overall systolic      function appeared to be well preserved.  No segmental abnormalities      or contraction were identified.  Ejection fraction will be      estimated in the 55 __________.  Aortic leaflets appeared to move      well.  The proximal aortic root appeared to be relatively smooth.  3. As previously noted, there is calcification predominantly noted at      the distal left main trifurcation interface.  4. There does not appear to be significant narrowing of the left main      vessel.  It is at least moderate in caliber.  5. The left anterior descending artery courses to the apex.      Throughout the LAD, there are luminal irregularities.  There is a      30% proximal stenosis overlapping the takeoff of the septal      perforator.  There is probably 30-40% narrowing  more distally best      noted in the RAO caudal views.  This does not appear to be      critical.  6. There is a significant ramus intermedius vessel.  It is moderate in      size.  On the initial shots, there is ostial stenosis of probably      70.  After the administration of intracoronary nitroglycerin and      repeat angiography, this stenosis appears to be more in the range      of about 80-90%, in part related to a maximal expansion of the      normal artery.  The vessel distally bifurcates.  7. The AV circumflex has about 30% narrowing just at this location      where the calcified is, it then provides a moderate-sized marginal      branch that is free of critical narrowing.  There are luminal      irregularities throughout this branch and the AV circumflex also      supplies several smaller marginal branches all of which are without      critical narrowing.  8. The right coronary artery demonstrates some  irregularities with 30-      40% narrowing in the midvessel.  There is mild plaquing near the      crux providing a posterior descending and posterolateral branch and      one of the posterolateral branches comes off as a somewhat of an      acute marginal type branch that goes posteriorly into the      posterolateral segment that is free of critical disease.   CONCLUSIONS:  1. Preserved left ventricular function.  2. Moderate calcification of the coronary arteries.  3. 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   DISPOSITION:  1. We will have the patient follow up with Dr. Daleen Squibb in the office.  2. We will initiate medical therapy.  The lesion is not ideal for      percutaneous intervention given its ostial location, and its      relationship to the LAD circumflex interface.  Compression of both      of these vessels is likely with percutaneous intervention.  The      disease does not appear to be extensive enough to recommend the      revascularization surgery at this point.  The initiation of medical      therapy would seem to be the optimal approach.  She is on beta      blockade, and also on cholesterol-lowering agents.  She has stopped      smoking.  Low-dose Imdur would also be considered.      Arturo Morton. Riley Kill, MD, [facility]  Electronically Signed     TDS/MEDQ  D:  09/20/2008  T:  09/21/2008  Job:  [id number]   cc:   Thomas C. Wall, MD, [facility]  CV Laboratory  Alfonse Alpers. Dagoberto Ligas, M.D.

## 2010-11-09 ENCOUNTER — Other Ambulatory Visit: Payer: Self-pay | Admitting: Family Medicine

## 2010-11-09 DIAGNOSIS — R911 Solitary pulmonary nodule: Secondary | ICD-10-CM

## 2010-11-14 ENCOUNTER — Ambulatory Visit
Admission: RE | Admit: 2010-11-14 | Discharge: 2010-11-14 | Disposition: A | Discharge status: Home / Self Care | Payer: 59 | Source: Ambulatory Visit | Attending: Family Medicine | Admitting: Family Medicine

## 2010-11-14 DIAGNOSIS — R911 Solitary pulmonary nodule: Secondary | ICD-10-CM

## 2010-12-26 ENCOUNTER — Encounter: Payer: Self-pay | Admitting: Family Medicine

## 2011-02-14 ENCOUNTER — Ambulatory Visit
Admission: RE | Admit: 2011-02-14 | Discharge: 2011-02-14 | Disposition: A | Discharge status: Home / Self Care | Payer: 59 | Source: Ambulatory Visit | Attending: Family Medicine | Admitting: Family Medicine

## 2011-02-14 ENCOUNTER — Other Ambulatory Visit: Payer: Self-pay | Admitting: Family Medicine

## 2011-02-14 DIAGNOSIS — W19XXXA Unspecified fall, initial encounter: Secondary | ICD-10-CM

## 2011-03-05 ENCOUNTER — Encounter: Payer: Self-pay | Admitting: *Deleted

## 2011-03-06 ENCOUNTER — Encounter: Payer: Self-pay | Admitting: Critical Care Medicine

## 2011-03-06 ENCOUNTER — Ambulatory Visit (INDEPENDENT_AMBULATORY_CARE_PROVIDER_SITE_OTHER): Payer: Medicare Other | Admitting: Critical Care Medicine

## 2011-03-06 VITALS — BP 132/88 | HR 55 | Temp 98.3°F | Ht 62.0 in | Wt 172.2 lb

## 2011-03-06 DIAGNOSIS — J438 Other emphysema: Secondary | ICD-10-CM

## 2011-03-06 DIAGNOSIS — J449 Chronic obstructive pulmonary disease, unspecified: Secondary | ICD-10-CM

## 2011-03-06 MED ORDER — TIOTROPIUM BROMIDE MONOHYDRATE 18 MCG IN CAPS: 18.0000 ug | ORAL_CAPSULE | Freq: Every day | RESPIRATORY_TRACT | Status: DC

## 2011-03-06 NOTE — Progress Notes (Signed)
Subjective:    Patient ID: Brittany Kirk, female    DOB: September 03, 1945, 65 y.o.   MRN: [id number]  HPI This is a 65 year old woman with Golds stage III COPD  January 14, 2009 4:38 PM  Doing fair except notes pn drip. Has a dry cough but no edema in feet. Overall is at baseline.  Pt denies any significant sore throat, nasal congestion or excess secretions, fever, chills, sweats, unintended weight loss, pleurtic or exertional chest pain, orthopnea PND, or leg swelling  Pt denies any increase in rescue therapy over baseline, denies waking up needing it or having any early am or nocturnal exacerbations of coughing/wheezing/or dyspnea.   October 07, 2008 3:47 PM  We started spiriva at last ov. Pt had heart cath. No stents placed. Now exercising more. No new issues.  less dyspnea.   03/06/2011 Not seen since 2010, recent fall T5 Vert Fx.  Injured face.  02/05/2011. Still with back pain. Hx COPD . Has been to [place] [facility].   Dyspnea is worse with QHS snoring and wheeze. Not active since 4/12.    Past Medical History  Diagnosis Date  . Hyperlipidemia   . Hypertension   . Depression   . Tobacco user   . History of skin cancer   . Emphysema   . Allergic rhinitis   . Cancer   . CAD (coronary artery disease)      Family History  Problem Relation Age of Onset  . Heart disease      5 brothers and 2 sister  . Arthritis Mother   . Diverticulitis Sister      History   Social History  . Marital Status: Married    Spouse Name: N/A    Number of Children: N/A  . Years of Education: N/A   Occupational History  . retired truck Network engineer    Social History Main Topics  . Smoking status: Former Smoker -- 0.5 packs/day for 40 years  . Smokeless tobacco: Never Used  . Alcohol Use: No  . Drug Use: No  . Sexually Active: Not on file   Other Topics Concern  . Not on file   Social History Narrative  . No narrative on file     Allergies  Allergen Reactions  . Cefaclor   . Metronidazole     . Naproxen   . Nsaids   . Penicillins   . Septra (Bactrim)   . Sulfonamide Derivatives      Outpatient Prescriptions Prior to Visit  Medication Sig Dispense Refill  . clonazePAM (KLONOPIN) 0.5 MG tablet Take 0.5 mg by mouth at bedtime as needed. Patient takes 2 at night      . escitalopram (LEXAPRO) 10 MG tablet Take 10 mg by mouth daily.        Marland Kitchen esomeprazole (NEXIUM) 40 MG capsule Take 40 mg by mouth daily before breakfast.        . propranolol (INDERAL) 80 MG tablet Take 80 mg by mouth daily.        . ramipril (ALTACE) 5 MG tablet Take 5 mg by mouth daily.        Marland Kitchen triamterene-hydrochlorothiazide (MAXZIDE-25) 37.5-25 MG per tablet Take 1 tablet by mouth daily.        . rosuvastatin (CRESTOR) 5 MG tablet Take 5 mg by mouth daily.            Review of Systems Constitutional:   No  weight loss, night sweats,  Fevers, chills, fatigue, lassitude.  HEENT:   No headaches,  Difficulty swallowing,  Tooth/dental problems,  Sore throat,                No sneezing, itching, ear ache, nasal congestion, post nasal drip,   CV:  No chest pain,  Orthopnea, PND, swelling in lower extremities, anasarca, dizziness, palpitations  GI  No heartburn, indigestion, abdominal pain, nausea, vomiting, diarrhea, change in bowel habits, loss of appetite  Resp: Notes  shortness of breath with exertion not  at rest.  No excess mucus, no productive cough,  No non-productive cough,  No coughing up of blood.  No change in color of mucus.  No wheezing.  No chest wall deformity  Skin: no rash or lesions.  GU: no dysuria, change in color of urine, no urgency or frequency.  No flank pain.  MS:  No joint pain or swelling.  No decreased range of motion.  No back pain.  Psych:  No change in mood or affect. No depression or anxiety.  No memory loss.     Objective:   Physical Exam Filed Vitals:   03/06/11 1149  BP: 132/88  Pulse: 55  Temp: 98.3 F (36.8 C)  TempSrc: Oral  Height: 5\' 2"  (1.575 m)  Weight:  172 lb 3.2 oz (78.109 kg)  SpO2: 97%    Gen: Pleasant, well-nourished, in no distress,  normal affect  ENT: No lesions,  mouth clear,  oropharynx clear, no postnasal drip  Neck: No JVD, no TMG, no carotid bruits  Lungs: No use of accessory muscles, no dullness to percussion, distant BS  Cardiovascular: RRR, heart sounds normal, no murmur or gallops, no peripheral edema  Abdomen: soft and NT, no HSM,  BS normal  Musculoskeletal: No deformities, no cyanosis or clubbing  Neuro: alert, non focal  Skin: Warm, no lesions or rashes  PFT Conversion 10/07/2008  FVC PREDICT 2.74  FVC  % Predicted 85  FEV1 1.72  FEV1 PREDICT 1.99  FEV % Predicted 87  FEV1/FVC 74.1  FEV1/FVC PRE 73  FeF 25-75 1.26  FeF 25-75 % Predicted 2.35  FEF % EXPEC 53  POST FVC 2.27  FVCPRDPST 2.74  POST FVC%EXP 83  POST FEV1 1.67  FEV1PRDPST 1.99  POSTFEV1%PRD 84  PSTFEV1/FVC 73  FEV1FVCPRDPS 73  PSTFEF25/75% 1.18  PSTFEF25/75P 2.35  FEF2575%EXPS 50  Residual Volume (ml) 1.76  RV % EXPECT 103  DLCO (ml/mmHg sec) 15.2  DLCO % EXPEC 74  DLCO/VA 4.13  DLCO/VA%EXP 113      Assessment & Plan:   EMPHYSEMA Chronic obstructive lung disease with primary emphysematous component. The patient seems to be somewhat worse since being off inhaled medications. Plan Overnight sleep oximetry will be obtained Restart spiriva daily  flu vaccine this week Return 4 months      Updated Medication List Outpatient Encounter Prescriptions as of 03/06/2011  Medication Sig Dispense Refill  . clonazePAM (KLONOPIN) 0.5 MG tablet Take 0.5 mg by mouth at bedtime as needed. Patient takes 2 at night      . escitalopram (LEXAPRO) 10 MG tablet Take 10 mg by mouth daily.        Marland Kitchen esomeprazole (NEXIUM) 40 MG capsule Take 40 mg by mouth daily before breakfast.        . propranolol (INDERAL) 80 MG tablet Take 80 mg by mouth daily.        . ramipril (ALTACE) 5 MG tablet Take 5 mg by mouth daily.        Marland Kitchen  triamterene-hydrochlorothiazide (  MAXZIDE-25) 37.5-25 MG per tablet Take 1 tablet by mouth daily.        Marland Kitchen tiotropium (SPIRIVA HANDIHALER) 18 MCG inhalation capsule Place 1 capsule (18 mcg total) into inhaler and inhale daily.  30 capsule  6  . DISCONTD: rosuvastatin (CRESTOR) 5 MG tablet Take 5 mg by mouth daily.

## 2011-03-06 NOTE — Patient Instructions (Signed)
Overnight sleep oximetry will be obtained Restart spiriva daily Get your flu vaccine this week Return 4 months

## 2011-03-07 NOTE — Assessment & Plan Note (Signed)
Chronic obstructive lung disease with primary emphysematous component. The patient seems to be somewhat worse since being off inhaled medications. Plan Overnight sleep oximetry will be obtained Restart spiriva daily  flu vaccine this week Return 4 months

## 2011-03-13 ENCOUNTER — Telehealth: Payer: Self-pay | Admitting: Critical Care Medicine

## 2011-03-13 DIAGNOSIS — J449 Chronic obstructive pulmonary disease, unspecified: Secondary | ICD-10-CM

## 2011-03-13 MED ORDER — TIOTROPIUM BROMIDE MONOHYDRATE 18 MCG IN CAPS: 18.0000 ug | ORAL_CAPSULE | Freq: Every day | RESPIRATORY_TRACT | Status: DC

## 2011-03-13 NOTE — Telephone Encounter (Signed)
I spoke with pt and she states states her [facility]. Pt states she needs a 90 day supply sent to [facility]. Rx has been resent

## 2011-08-06 DIAGNOSIS — F329 Major depressive disorder, single episode, unspecified: Secondary | ICD-10-CM | POA: Diagnosis not present

## 2011-12-14 DIAGNOSIS — K219 Gastro-esophageal reflux disease without esophagitis: Secondary | ICD-10-CM | POA: Diagnosis not present

## 2011-12-14 DIAGNOSIS — I10 Essential (primary) hypertension: Secondary | ICD-10-CM | POA: Diagnosis not present

## 2012-01-10 DIAGNOSIS — Z113 Encounter for screening for infections with a predominantly sexual mode of transmission: Secondary | ICD-10-CM | POA: Diagnosis not present

## 2012-01-10 DIAGNOSIS — N949 Unspecified condition associated with female genital organs and menstrual cycle: Secondary | ICD-10-CM | POA: Diagnosis not present

## 2012-01-10 DIAGNOSIS — R1031 Right lower quadrant pain: Secondary | ICD-10-CM | POA: Diagnosis not present

## 2012-01-10 DIAGNOSIS — N809 Endometriosis, unspecified: Secondary | ICD-10-CM | POA: Diagnosis not present

## 2012-03-10 DIAGNOSIS — Z23 Encounter for immunization: Secondary | ICD-10-CM | POA: Diagnosis not present

## 2012-03-10 DIAGNOSIS — R110 Nausea: Secondary | ICD-10-CM | POA: Diagnosis not present

## 2012-03-10 DIAGNOSIS — F411 Generalized anxiety disorder: Secondary | ICD-10-CM | POA: Diagnosis not present

## 2012-04-01 DIAGNOSIS — R110 Nausea: Secondary | ICD-10-CM | POA: Diagnosis not present

## 2012-04-07 DIAGNOSIS — Z124 Encounter for screening for malignant neoplasm of cervix: Secondary | ICD-10-CM | POA: Diagnosis not present

## 2012-04-07 DIAGNOSIS — Z1231 Encounter for screening mammogram for malignant neoplasm of breast: Secondary | ICD-10-CM | POA: Diagnosis not present

## 2012-04-07 DIAGNOSIS — M810 Age-related osteoporosis without current pathological fracture: Secondary | ICD-10-CM | POA: Diagnosis not present

## 2012-04-07 DIAGNOSIS — Z13820 Encounter for screening for osteoporosis: Secondary | ICD-10-CM | POA: Diagnosis not present

## 2012-04-10 DIAGNOSIS — Z86010 Personal history of colonic polyps: Secondary | ICD-10-CM | POA: Diagnosis not present

## 2012-04-10 DIAGNOSIS — A048 Other specified bacterial intestinal infections: Secondary | ICD-10-CM | POA: Diagnosis not present

## 2012-04-10 DIAGNOSIS — R110 Nausea: Secondary | ICD-10-CM | POA: Diagnosis not present

## 2012-05-06 DIAGNOSIS — R110 Nausea: Secondary | ICD-10-CM | POA: Diagnosis not present

## 2012-05-29 DIAGNOSIS — K2960 Other gastritis without bleeding: Secondary | ICD-10-CM | POA: Diagnosis not present

## 2012-05-29 DIAGNOSIS — D131 Benign neoplasm of stomach: Secondary | ICD-10-CM | POA: Diagnosis not present

## 2012-05-29 DIAGNOSIS — R110 Nausea: Secondary | ICD-10-CM | POA: Diagnosis not present

## 2012-05-29 DIAGNOSIS — K319 Disease of stomach and duodenum, unspecified: Secondary | ICD-10-CM | POA: Diagnosis not present

## 2012-05-29 DIAGNOSIS — K449 Diaphragmatic hernia without obstruction or gangrene: Secondary | ICD-10-CM | POA: Diagnosis not present

## 2012-05-29 DIAGNOSIS — D1330 Benign neoplasm of unspecified part of small intestine: Secondary | ICD-10-CM | POA: Diagnosis not present

## 2012-05-29 DIAGNOSIS — A048 Other specified bacterial intestinal infections: Secondary | ICD-10-CM | POA: Diagnosis not present

## 2012-05-30 ENCOUNTER — Other Ambulatory Visit (HOSPITAL_COMMUNITY): Payer: Self-pay | Admitting: Gastroenterology

## 2012-05-30 DIAGNOSIS — R110 Nausea: Secondary | ICD-10-CM

## 2012-06-05 ENCOUNTER — Encounter (HOSPITAL_COMMUNITY)
Admission: RE | Admit: 2012-06-05 | Discharge: 2012-06-05 | Disposition: A | Discharge status: Home / Self Care | Payer: Medicare Other | Source: Ambulatory Visit | Attending: Gastroenterology | Admitting: Gastroenterology

## 2012-06-05 DIAGNOSIS — R634 Abnormal weight loss: Secondary | ICD-10-CM | POA: Diagnosis not present

## 2012-06-05 DIAGNOSIS — R110 Nausea: Secondary | ICD-10-CM | POA: Insufficient documentation

## 2012-06-05 MED ORDER — TECHNETIUM TC 99M SULFUR COLLOID
2.0000 | Freq: Once | INTRAVENOUS | Status: AC | PRN
  Administered 2012-06-05: 2 via INTRAVENOUS

## 2012-06-17 ENCOUNTER — Other Ambulatory Visit: Payer: Self-pay | Admitting: Gastroenterology

## 2012-06-17 DIAGNOSIS — R110 Nausea: Secondary | ICD-10-CM

## 2012-06-18 DIAGNOSIS — R110 Nausea: Secondary | ICD-10-CM | POA: Diagnosis not present

## 2012-06-20 ENCOUNTER — Ambulatory Visit
Admission: RE | Admit: 2012-06-20 | Discharge: 2012-06-20 | Disposition: A | Discharge status: Home / Self Care | Payer: Medicare Other | Source: Ambulatory Visit | Attending: Gastroenterology | Admitting: Gastroenterology

## 2012-06-20 DIAGNOSIS — R110 Nausea: Secondary | ICD-10-CM

## 2012-06-20 DIAGNOSIS — K8020 Calculus of gallbladder without cholecystitis without obstruction: Secondary | ICD-10-CM | POA: Diagnosis not present

## 2012-06-20 MED ORDER — IOHEXOL 300 MG/ML  SOLN
100.0000 mL | Freq: Once | INTRAMUSCULAR | Status: AC | PRN
  Administered 2012-06-20: 100 mL via INTRAVENOUS

## 2012-07-03 ENCOUNTER — Ambulatory Visit (INDEPENDENT_AMBULATORY_CARE_PROVIDER_SITE_OTHER): Payer: Medicare Other | Admitting: General Surgery

## 2012-07-03 ENCOUNTER — Encounter (INDEPENDENT_AMBULATORY_CARE_PROVIDER_SITE_OTHER): Payer: Self-pay | Admitting: General Surgery

## 2012-07-03 VITALS — BP 146/82 | HR 64 | Resp 16 | Ht 61.0 in | Wt 165.0 lb

## 2012-07-03 DIAGNOSIS — K8020 Calculus of gallbladder without cholecystitis without obstruction: Secondary | ICD-10-CM | POA: Diagnosis not present

## 2012-07-03 DIAGNOSIS — E875 Hyperkalemia: Secondary | ICD-10-CM | POA: Diagnosis not present

## 2012-07-03 NOTE — Patient Instructions (Signed)

## 2012-07-03 NOTE — Progress Notes (Signed)
Patient ID: Brittany Kirk, female   DOB: 16-Nov-1945, 67 y.o.   MRN: [id number]  Chief Complaint  Patient presents with  . Other    Eval gallbladder    HPI Brittany Kirk is a 67 y.o. female.   HPI 67 yo WF referred by Dr Ewing Schlein Evaluation of nausea, abdominal pain. The patient states that her symptoms began with nausea several months ago. She states that she was more nauseous in the morning. She cannot relate her nauseousness to any particular activity or food. She states that she's also had some intermittent pain on her right side. She describes it as sharp pain. It will last for short time. It does not radiate. It can occur randomly. She does have some baseline heartburn and reflux that she's had for many years. She states that she's probably lost about 11 pounds over the past 6 months because of her nausea. She was tried on Phenergan however she stopped taking it because it made her lethargic. She has had a CT scan, an upper endoscopy, a gastric emptying study. She denies any jaundice. She denies any stool caliber changes. She denies any diarrhea or constipation. She has noticed some occasional bloating. She rarely takes NSAIDs. She denies any acholic stools.  She states that her labs were checked at the beginning of the month and she has to get her labs rechecked because her potassium was elevated. Past Medical History  Diagnosis Date  . Hyperlipidemia   . Hypertension   . Depression   . Tobacco user   . History of skin cancer   . Emphysema   . Allergic rhinitis   . Cancer   . CAD (coronary artery disease)     Past Surgical History  Procedure Date  . Tubal ligation   . Tonsilectomy, adenoidectomy, bilateral myringotomy and tubes     Family History  Problem Relation Age of Onset  . Heart disease      5 brothers and 2 sister  . Arthritis Mother   . Diverticulitis Sister     Social History History  Substance Use Topics  . Smoking status: Current Every Day Smoker -- 0.5  packs/day for 40 years    Types: Cigarettes  . Smokeless tobacco: Never Used  . Alcohol Use: No    Allergies  Allergen Reactions  . Cefaclor   . Metronidazole   . Naproxen   . Nsaids   . Penicillins   . Septra (Bactrim)   . Sulfonamide Derivatives     Current Outpatient Prescriptions  Medication Sig Dispense Refill  . escitalopram (LEXAPRO) 10 MG tablet Take 10 mg by mouth daily.        Marland Kitchen omeprazole (PRILOSEC) 20 MG capsule Take 20 mg by mouth daily.      . propranolol (INDERAL) 80 MG tablet Take 80 mg by mouth daily.        Marland Kitchen tiotropium (SPIRIVA HANDIHALER) 18 MCG inhalation capsule Place 1 capsule (18 mcg total) into inhaler and inhale daily.  90 capsule  3    Review of Systems Review of Systems  Constitutional: Negative for fever, chills and unexpected weight change.  HENT: Negative for hearing loss, congestion, sore throat, trouble swallowing and voice change.   Eyes: Negative for visual disturbance.  Respiratory: Negative for cough, shortness of breath and wheezing.   Cardiovascular: Negative for chest pain, palpitations and leg swelling.       Denies orthopnea, PND, SOB  Gastrointestinal: Positive for nausea and abdominal pain. Negative for  vomiting, diarrhea, constipation, blood in stool, abdominal distention and anal bleeding.  Genitourinary: Negative for hematuria, vaginal bleeding and difficulty urinating.  Musculoskeletal: Negative for arthralgias.  Skin: Negative for rash and wound.  Neurological: Negative for seizures, syncope and headaches.       Denies amaurosis fugax  Hematological: Negative for adenopathy. Does not bruise/bleed easily.  Psychiatric/Behavioral: Negative for confusion. The patient is nervous/anxious.     Blood pressure 146/82, pulse 64, resp. rate 16, height 5\' 1"  (1.549 m), weight 165 lb (74.844 kg).  Physical Exam Physical Exam  Vitals reviewed. Constitutional: She is oriented to person, place, and time. She appears well-developed and  well-nourished. No distress.  HENT:  Head: Normocephalic and atraumatic.  Right Ear: External ear normal.  Left Ear: External ear normal.  Eyes: Conjunctivae normal are normal. No scleral icterus.  Neck: Normal range of motion. Neck supple. No tracheal deviation present. No thyromegaly present.  Cardiovascular: Normal rate, regular rhythm and normal heart sounds.   Pulmonary/Chest: Effort normal and breath sounds normal. No respiratory distress. She has no wheezes.  Abdominal: Soft. Bowel sounds are normal. She exhibits no distension. There is no tenderness. There is no rebound and no guarding.  Musculoskeletal: She exhibits no edema and no tenderness.  Neurological: She is alert and oriented to person, place, and time. She exhibits normal muscle tone.  Skin: Skin is warm and dry. No rash noted. She is not diaphoretic. No erythema. No pallor.  Psychiatric: She has a normal mood and affect. Her behavior is normal. Judgment and thought content normal.    Data Reviewed CT abd pelvis - gallstones Gastric emptying study - normal EGD 05/29/12 - small hiatal hernia, gastritis, gastric polyp - path: benign; mild inflammation, neg h pylori, malignancy Dr Marlane Hatcher note Labs from 06/18/12 - nml cbc; nml cmet except for potassium 5.9  Assessment    Symptomatic cholelithiasis Nausea HTN Tobacco use    Plan    I believe most of the patient's symptoms are consistent with gallbladder disease. Since she has had an upper endoscopy which only revealed some mild inflammation and normal gastric emptying study, I believe her symptoms can be attributed to her gallbladder.   We discussed gallbladder disease. The patient was given Agricultural engineer. We discussed non-operative and operative management. We discussed the signs & symptoms of acute cholecystitis  I discussed laparoscopic cholecystectomy with IOC in detail.  The patient was given educational material as well as diagrams detailing the  procedure.  We discussed the risks and benefits of a laparoscopic cholecystectomy including, but not limited to bleeding, infection, injury to surrounding structures such as the intestine or liver, bile leak, retained gallstones, need to convert to an open procedure, prolonged diarrhea, blood clots such as  DVT, common bile duct injury, anesthesia risks, and possible need for additional procedures.  We discussed the typical post-operative recovery course. I explained that the likelihood of improvement of their symptoms is good. However I did explain that I cannot guarantee that all of her nausea would be ameliorated with a cholecystectomy. The patient has elected to proceed with laparoscopic cholecystectomy  We'll obtain a copy of her labs that she is going to drawn today. I explained that if her potassium is still elevated then we may have to postpone surgery until her hyperkalemia is resolved  Mary Sella. Andrey Campanile, MD, FACS General, Bariatric, & Minimally Invasive Surgery [facility], [place]         [facility] M 07/03/2012, 10:22 AM

## 2012-07-04 ENCOUNTER — Other Ambulatory Visit (HOSPITAL_COMMUNITY): Payer: Self-pay | Admitting: General Surgery

## 2012-07-04 ENCOUNTER — Encounter (HOSPITAL_COMMUNITY): Payer: Self-pay | Admitting: Pharmacy Technician

## 2012-07-04 NOTE — Patient Instructions (Addendum)
20 Brittany Kirk  07/04/2012   Your procedure is scheduled on: 07-08-12  Report to [facility] at 0530 AM.  Call this number if you have problems the morning of surgery [phone]   Remember:   Do not eat food or drink liquids :After Midnight.     Take these medicines the morning of surgery with A SIP OF WATER:   ES CITALOPRAM   PRILOSEC   PROPRANOLOL                                SEE [place] PREPARING FOR SURGERY SHEET   Do not wear jewelry, make-up or nail polish.  Do not wear lotions, powders, or perfumes. You may wear deodorant.   Men may shave face and neck.  Do not bring valuables to the hospital.  Contacts, dentures or bridgework may not be worn into surgery.  Leave suitcase in the car. After surgery it may be brought to your room.  For patients admitted to the hospital, checkout time is 11:00 AM the day of discharge.   Patients discharged the day of surgery will not be allowed to drive home.  Name and phone number of your driver:  Special Instructions: N/A   Please read over the following fact sheets that you were given: MRSA Information.      FAILURE TO FOLLOW THESE INSTRUCTIONS MAY RESULT IN THE CANCELLATION OF YOUR SURGERY. PATIENT SIGNATURE___________________________________________

## 2012-07-07 ENCOUNTER — Ambulatory Visit (HOSPITAL_COMMUNITY)
Admission: RE | Admit: 2012-07-07 | Discharge: 2012-07-07 | Disposition: A | Discharge status: Home / Self Care | Payer: Medicare Other | Source: Ambulatory Visit | Attending: General Surgery | Admitting: General Surgery

## 2012-07-07 ENCOUNTER — Encounter (HOSPITAL_COMMUNITY)
Admission: RE | Admit: 2012-07-07 | Discharge: 2012-07-07 | Disposition: A | Discharge status: Home / Self Care | Payer: Medicare Other | Source: Ambulatory Visit | Attending: General Surgery | Admitting: General Surgery

## 2012-07-07 ENCOUNTER — Encounter (HOSPITAL_COMMUNITY): Payer: Self-pay

## 2012-07-07 DIAGNOSIS — I517 Cardiomegaly: Secondary | ICD-10-CM | POA: Insufficient documentation

## 2012-07-07 DIAGNOSIS — E785 Hyperlipidemia, unspecified: Secondary | ICD-10-CM | POA: Diagnosis not present

## 2012-07-07 DIAGNOSIS — F17200 Nicotine dependence, unspecified, uncomplicated: Secondary | ICD-10-CM | POA: Diagnosis not present

## 2012-07-07 DIAGNOSIS — K8020 Calculus of gallbladder without cholecystitis without obstruction: Secondary | ICD-10-CM | POA: Insufficient documentation

## 2012-07-07 DIAGNOSIS — Z01818 Encounter for other preprocedural examination: Secondary | ICD-10-CM | POA: Diagnosis not present

## 2012-07-07 DIAGNOSIS — K219 Gastro-esophageal reflux disease without esophagitis: Secondary | ICD-10-CM | POA: Insufficient documentation

## 2012-07-07 DIAGNOSIS — Z79899 Other long term (current) drug therapy: Secondary | ICD-10-CM | POA: Insufficient documentation

## 2012-07-07 DIAGNOSIS — J449 Chronic obstructive pulmonary disease, unspecified: Secondary | ICD-10-CM | POA: Insufficient documentation

## 2012-07-07 DIAGNOSIS — Z85828 Personal history of other malignant neoplasm of skin: Secondary | ICD-10-CM | POA: Diagnosis not present

## 2012-07-07 DIAGNOSIS — K8010 Calculus of gallbladder with chronic cholecystitis without obstruction: Secondary | ICD-10-CM | POA: Diagnosis not present

## 2012-07-07 DIAGNOSIS — J4489 Other specified chronic obstructive pulmonary disease: Secondary | ICD-10-CM | POA: Insufficient documentation

## 2012-07-07 DIAGNOSIS — I2510 Atherosclerotic heart disease of native coronary artery without angina pectoris: Secondary | ICD-10-CM | POA: Insufficient documentation

## 2012-07-07 DIAGNOSIS — K449 Diaphragmatic hernia without obstruction or gangrene: Secondary | ICD-10-CM | POA: Insufficient documentation

## 2012-07-07 DIAGNOSIS — Z01810 Encounter for preprocedural cardiovascular examination: Secondary | ICD-10-CM | POA: Insufficient documentation

## 2012-07-07 DIAGNOSIS — R110 Nausea: Secondary | ICD-10-CM | POA: Diagnosis not present

## 2012-07-07 DIAGNOSIS — I10 Essential (primary) hypertension: Secondary | ICD-10-CM | POA: Insufficient documentation

## 2012-07-07 DIAGNOSIS — Z01812 Encounter for preprocedural laboratory examination: Secondary | ICD-10-CM | POA: Insufficient documentation

## 2012-07-07 HISTORY — DX: Personal history of other diseases of the digestive system: Z87.19

## 2012-07-07 HISTORY — DX: Gastro-esophageal reflux disease without esophagitis: K21.9

## 2012-07-07 HISTORY — DX: Calculus of gallbladder without cholecystitis without obstruction: K80.20

## 2012-07-07 LAB — BASIC METABOLIC PANEL
BUN: 16 mg/dL (ref 6–23)
CO2: 30 mEq/L (ref 19–32)
Calcium: 9.1 mg/dL (ref 8.4–10.5)
Chloride: 106 mEq/L (ref 96–112)
Creatinine, Ser: 0.88 mg/dL (ref 0.50–1.10)
GFR calc Af Amer: 76 mL/min — ABNORMAL LOW (ref >90)
GFR calc non Af Amer: 66 mL/min — ABNORMAL LOW (ref >90)
Glucose, Bld: 94 mg/dL (ref 70–99)
Potassium: 4.2 mEq/L (ref 3.5–5.1)
Sodium: 143 mEq/L (ref 135–145)

## 2012-07-07 LAB — CBC
HCT: 44.1 % (ref 36.0–46.0)
Hemoglobin: 14.6 g/dL (ref 12.0–15.0)
MCH: 32.4 pg (ref 26.0–34.0)
MCHC: 33.1 g/dL (ref 30.0–36.0)
MCV: 97.8 fL (ref 78.0–100.0)
Platelets: 216 10*3/uL (ref 150–400)
RBC: 4.51 MIL/uL (ref 3.87–5.11)
RDW: 13.7 % (ref 11.5–15.5)
WBC: 7.8 10*3/uL (ref 4.0–10.5)

## 2012-07-07 LAB — SURGICAL PCR SCREEN
MRSA, PCR: NEGATIVE
Staphylococcus aureus: NEGATIVE

## 2012-07-07 NOTE — Pre-Procedure Instructions (Signed)
PREOP CBC, BMET, EKG, CXR WERE DONE TODAY AT [facility] AS PER ANESTHESIOLOGIST'S GUIDELINES.

## 2012-07-08 ENCOUNTER — Encounter (HOSPITAL_COMMUNITY): Payer: Self-pay | Admitting: *Deleted

## 2012-07-08 ENCOUNTER — Ambulatory Visit (HOSPITAL_COMMUNITY): Payer: Medicare Other

## 2012-07-08 ENCOUNTER — Encounter (HOSPITAL_COMMUNITY)
Admission: RE | Disposition: A | Discharge status: Home / Self Care | Payer: Self-pay | Source: Ambulatory Visit | Attending: General Surgery

## 2012-07-08 ENCOUNTER — Ambulatory Visit (HOSPITAL_COMMUNITY)
Admission: RE | Admit: 2012-07-08 | Discharge: 2012-07-08 | Disposition: A | Discharge status: Home / Self Care | Payer: Medicare Other | Source: Ambulatory Visit | Attending: General Surgery | Admitting: General Surgery

## 2012-07-08 ENCOUNTER — Inpatient Hospital Stay (HOSPITAL_COMMUNITY): Admission: RE | Admit: 2012-07-08 | Payer: Medicare Other | Source: Ambulatory Visit

## 2012-07-08 ENCOUNTER — Encounter (HOSPITAL_COMMUNITY): Payer: Self-pay | Admitting: Anesthesiology

## 2012-07-08 ENCOUNTER — Ambulatory Visit (HOSPITAL_COMMUNITY): Payer: Medicare Other | Admitting: Anesthesiology

## 2012-07-08 DIAGNOSIS — E785 Hyperlipidemia, unspecified: Secondary | ICD-10-CM | POA: Insufficient documentation

## 2012-07-08 DIAGNOSIS — R0602 Shortness of breath: Secondary | ICD-10-CM | POA: Diagnosis not present

## 2012-07-08 DIAGNOSIS — I2510 Atherosclerotic heart disease of native coronary artery without angina pectoris: Secondary | ICD-10-CM | POA: Diagnosis not present

## 2012-07-08 DIAGNOSIS — F17200 Nicotine dependence, unspecified, uncomplicated: Secondary | ICD-10-CM | POA: Insufficient documentation

## 2012-07-08 DIAGNOSIS — I10 Essential (primary) hypertension: Secondary | ICD-10-CM | POA: Diagnosis not present

## 2012-07-08 DIAGNOSIS — Z85828 Personal history of other malignant neoplasm of skin: Secondary | ICD-10-CM | POA: Insufficient documentation

## 2012-07-08 DIAGNOSIS — Z01810 Encounter for preprocedural cardiovascular examination: Secondary | ICD-10-CM | POA: Insufficient documentation

## 2012-07-08 DIAGNOSIS — K8010 Calculus of gallbladder with chronic cholecystitis without obstruction: Secondary | ICD-10-CM | POA: Diagnosis not present

## 2012-07-08 DIAGNOSIS — K8020 Calculus of gallbladder without cholecystitis without obstruction: Secondary | ICD-10-CM | POA: Diagnosis not present

## 2012-07-08 DIAGNOSIS — Z79899 Other long term (current) drug therapy: Secondary | ICD-10-CM | POA: Insufficient documentation

## 2012-07-08 DIAGNOSIS — Z01812 Encounter for preprocedural laboratory examination: Secondary | ICD-10-CM | POA: Insufficient documentation

## 2012-07-08 DIAGNOSIS — K219 Gastro-esophageal reflux disease without esophagitis: Secondary | ICD-10-CM | POA: Diagnosis not present

## 2012-07-08 DIAGNOSIS — R110 Nausea: Secondary | ICD-10-CM | POA: Insufficient documentation

## 2012-07-08 HISTORY — PX: CHOLECYSTECTOMY: SHX55

## 2012-07-08 SURGERY — LAPAROSCOPIC CHOLECYSTECTOMY WITH INTRAOPERATIVE CHOLANGIOGRAM
Anesthesia: General | Site: Abdomen | Wound class: Contaminated

## 2012-07-08 MED ORDER — IOHEXOL 300 MG/ML  SOLN
INTRAMUSCULAR | Status: DC | PRN
  Administered 2012-07-08: 10 mL via INTRAVENOUS

## 2012-07-08 MED ORDER — HYDRALAZINE HCL 20 MG/ML IJ SOLN
5.0000 mg | Freq: Once | INTRAMUSCULAR | Status: AC
  Administered 2012-07-08: 5 mg via INTRAVENOUS

## 2012-07-08 MED ORDER — PROMETHAZINE HCL 25 MG/ML IJ SOLN
INTRAMUSCULAR | Status: AC
  Filled 2012-07-08: qty 1

## 2012-07-08 MED ORDER — FENTANYL CITRATE 0.05 MG/ML IJ SOLN
INTRAMUSCULAR | Status: DC | PRN
  Administered 2012-07-08: 100 ug via INTRAVENOUS
  Administered 2012-07-08: 50 ug via INTRAVENOUS

## 2012-07-08 MED ORDER — MEPERIDINE HCL 50 MG/ML IJ SOLN: 6.2500 mg | INTRAMUSCULAR | Status: DC | PRN

## 2012-07-08 MED ORDER — PROPOFOL 10 MG/ML IV BOLUS
INTRAVENOUS | Status: DC | PRN
  Administered 2012-07-08: 150 mg via INTRAVENOUS

## 2012-07-08 MED ORDER — HYDRALAZINE HCL 20 MG/ML IJ SOLN
INTRAMUSCULAR | Status: DC | PRN
  Administered 2012-07-08: 2 mg via INTRAVENOUS
  Administered 2012-07-08: 4 mg via INTRAVENOUS

## 2012-07-08 MED ORDER — OXYCODONE HCL 5 MG PO TABS: 5.0000 mg | ORAL_TABLET | ORAL | Status: DC | PRN

## 2012-07-08 MED ORDER — CIPROFLOXACIN IN D5W 400 MG/200ML IV SOLN
400.0000 mg | INTRAVENOUS | Status: AC
  Administered 2012-07-08: 400 mg via INTRAVENOUS

## 2012-07-08 MED ORDER — 0.9 % SODIUM CHLORIDE (POUR BTL) OPTIME
TOPICAL | Status: DC | PRN
  Administered 2012-07-08: 1000 mL

## 2012-07-08 MED ORDER — OXYCODONE HCL 5 MG/5ML PO SOLN
5.0000 mg | Freq: Once | ORAL | Status: AC | PRN
  Filled 2012-07-08: qty 5

## 2012-07-08 MED ORDER — BUPIVACAINE-EPINEPHRINE PF 0.25-1:200000 % IJ SOLN
INTRAMUSCULAR | Status: AC
  Filled 2012-07-08: qty 30

## 2012-07-08 MED ORDER — HYDROMORPHONE HCL PF 1 MG/ML IJ SOLN
0.2500 mg | INTRAMUSCULAR | Status: DC | PRN
  Administered 2012-07-08 (×2): 0.5 mg via INTRAVENOUS

## 2012-07-08 MED ORDER — GLYCOPYRROLATE 0.2 MG/ML IJ SOLN
INTRAMUSCULAR | Status: DC | PRN
  Administered 2012-07-08: 0.4 mg via INTRAVENOUS
  Administered 2012-07-08: 0.3 mg via INTRAVENOUS

## 2012-07-08 MED ORDER — BUPIVACAINE-EPINEPHRINE 0.25% -1:200000 IJ SOLN
INTRAMUSCULAR | Status: DC | PRN
  Administered 2012-07-08: 30 mL

## 2012-07-08 MED ORDER — OXYCODONE-ACETAMINOPHEN 5-325 MG PO TABS: 1.0000 | ORAL_TABLET | ORAL | Status: DC | PRN

## 2012-07-08 MED ORDER — HYDROMORPHONE HCL PF 1 MG/ML IJ SOLN
INTRAMUSCULAR | Status: AC
  Filled 2012-07-08: qty 1

## 2012-07-08 MED ORDER — LACTATED RINGERS IV SOLN
INTRAVENOUS | Status: DC | PRN
  Administered 2012-07-08 (×2): via INTRAVENOUS

## 2012-07-08 MED ORDER — ACETAMINOPHEN 10 MG/ML IV SOLN
INTRAVENOUS | Status: DC | PRN
  Administered 2012-07-08: 1000 mg via INTRAVENOUS

## 2012-07-08 MED ORDER — ONDANSETRON HCL 4 MG/2ML IJ SOLN
INTRAMUSCULAR | Status: DC | PRN
  Administered 2012-07-08: 4 mg via INTRAVENOUS

## 2012-07-08 MED ORDER — MIDAZOLAM HCL 5 MG/5ML IJ SOLN
INTRAMUSCULAR | Status: DC | PRN
  Administered 2012-07-08: 2 mg via INTRAVENOUS

## 2012-07-08 MED ORDER — EPHEDRINE SULFATE 50 MG/ML IJ SOLN
INTRAMUSCULAR | Status: DC | PRN
  Administered 2012-07-08: 7.5 mg via INTRAVENOUS

## 2012-07-08 MED ORDER — PROMETHAZINE HCL 25 MG/ML IJ SOLN
6.2500 mg | INTRAMUSCULAR | Status: DC | PRN
  Administered 2012-07-08: 6.25 mg via INTRAVENOUS

## 2012-07-08 MED ORDER — CISATRACURIUM BESYLATE (PF) 10 MG/5ML IV SOLN
INTRAVENOUS | Status: DC | PRN
  Administered 2012-07-08: 9 mg via INTRAVENOUS

## 2012-07-08 MED ORDER — HYDRALAZINE HCL 20 MG/ML IJ SOLN
INTRAMUSCULAR | Status: AC
  Filled 2012-07-08: qty 1

## 2012-07-08 MED ORDER — ACETAMINOPHEN 10 MG/ML IV SOLN: 1000.0000 mg | Freq: Once | INTRAVENOUS | Status: DC | PRN

## 2012-07-08 MED ORDER — IOHEXOL 300 MG/ML  SOLN
INTRAMUSCULAR | Status: AC
  Filled 2012-07-08: qty 1

## 2012-07-08 MED ORDER — NEOSTIGMINE METHYLSULFATE 1 MG/ML IJ SOLN
INTRAMUSCULAR | Status: DC | PRN
  Administered 2012-07-08: 3 mg via INTRAVENOUS

## 2012-07-08 MED ORDER — ACETAMINOPHEN 10 MG/ML IV SOLN
INTRAVENOUS | Status: AC
  Filled 2012-07-08: qty 100

## 2012-07-08 MED ORDER — OXYCODONE HCL 5 MG PO TABS
5.0000 mg | ORAL_TABLET | Freq: Once | ORAL | Status: AC | PRN
  Administered 2012-07-08: 5 mg via ORAL
  Filled 2012-07-08: qty 1

## 2012-07-08 MED ORDER — CIPROFLOXACIN IN D5W 400 MG/200ML IV SOLN
INTRAVENOUS | Status: AC
  Filled 2012-07-08: qty 200

## 2012-07-08 SURGICAL SUPPLY — 43 items
APL SKNCLS STERI-STRIP NONHPOA (GAUZE/BANDAGES/DRESSINGS) ×1
APPLIER CLIP 5 13 M/L LIGAMAX5 (MISCELLANEOUS) ×2
APPLIER CLIP ROT 10 11.4 M/L (STAPLE)
BANDAGE ADHESIVE 1X3 (GAUZE/BANDAGES/DRESSINGS) ×6 IMPLANT
BENZOIN TINCTURE PRP APPL 2/3 (GAUZE/BANDAGES/DRESSINGS) ×2 IMPLANT
CANISTER SUCTION 2500CC (MISCELLANEOUS) ×2 IMPLANT
CHLORAPREP W/TINT 26ML (MISCELLANEOUS) ×2 IMPLANT
CLIP APPLIE 5 13 M/L LIGAMAX5 (MISCELLANEOUS) ×1 IMPLANT
CLIP APPLIE ROT 10 11.4 M/L (STAPLE) IMPLANT
CLOTH BEACON ORANGE TIMEOUT ST (SAFETY) ×2 IMPLANT
COVER MAYO STAND STRL (DRAPES) ×2 IMPLANT
COVER SURGICAL LIGHT HANDLE (MISCELLANEOUS) ×2 IMPLANT
DECANTER SPIKE VIAL GLASS SM (MISCELLANEOUS) ×2 IMPLANT
DERMABOND ADVANCED (GAUZE/BANDAGES/DRESSINGS) ×1
DERMABOND ADVANCED .7 DNX12 (GAUZE/BANDAGES/DRESSINGS) ×1 IMPLANT
DRAPE C-ARM 42X72 X-RAY (DRAPES) ×2 IMPLANT
DRAPE LAPAROSCOPIC ABDOMINAL (DRAPES) ×2 IMPLANT
DRAPE UTILITY XL STRL (DRAPES) ×2 IMPLANT
DRSG TEGADERM 2-3/8X2-3/4 SM (GAUZE/BANDAGES/DRESSINGS) ×2 IMPLANT
ELECT REM PT RETURN 9FT ADLT (ELECTROSURGICAL) ×2
ELECTRODE REM PT RTRN 9FT ADLT (ELECTROSURGICAL) ×1 IMPLANT
GLOVE BIO SURGEON STRL SZ7.5 (GLOVE) ×2 IMPLANT
GLOVE BIOGEL M STRL SZ7.5 (GLOVE) ×2 IMPLANT
GLOVE BIOGEL PI IND STRL 7.0 (GLOVE) ×1 IMPLANT
GLOVE BIOGEL PI INDICATOR 7.0 (GLOVE) ×1
GLOVE INDICATOR 8.0 STRL GRN (GLOVE) ×2 IMPLANT
GOWN STRL NON-REIN LRG LVL3 (GOWN DISPOSABLE) ×2 IMPLANT
GOWN STRL REIN XL XLG (GOWN DISPOSABLE) ×4 IMPLANT
HEMOSTAT SNOW SURGICEL 2X4 (HEMOSTASIS) ×2 IMPLANT
KIT BASIN OR (CUSTOM PROCEDURE TRAY) ×2 IMPLANT
NS IRRIG 1000ML POUR BTL (IV SOLUTION) ×2 IMPLANT
POUCH SPECIMEN RETRIEVAL 10MM (ENDOMECHANICALS) ×2 IMPLANT
SET CHOLANGIOGRAPH MIX (MISCELLANEOUS) ×2 IMPLANT
SET IRRIG TUBING LAPAROSCOPIC (IRRIGATION / IRRIGATOR) ×2 IMPLANT
SOLUTION ANTI FOG 6CC (MISCELLANEOUS) ×2 IMPLANT
STRIP CLOSURE SKIN 1/2X4 (GAUZE/BANDAGES/DRESSINGS) ×2 IMPLANT
SUT MNCRL AB 4-0 PS2 18 (SUTURE) ×2 IMPLANT
SUT VICRYL 0 UR6 27IN ABS (SUTURE) ×2 IMPLANT
TOWEL OR 17X26 10 PK STRL BLUE (TOWEL DISPOSABLE) ×4 IMPLANT
TRAY LAP CHOLE (CUSTOM PROCEDURE TRAY) ×2 IMPLANT
TROCAR BLADELESS OPT 5 75 (ENDOMECHANICALS) ×6 IMPLANT
TROCAR XCEL BLUNT TIP 100MML (ENDOMECHANICALS) ×2 IMPLANT
TUBING INSUFFLATION 10FT LAP (TUBING) ×2 IMPLANT

## 2012-07-08 NOTE — Progress Notes (Signed)
Medication given for elevated blood pressures after talking with Dr. Renold Don.

## 2012-07-08 NOTE — H&P (View-Only) (Signed)
Patient ID: Brittany Kirk, female   DOB: 07/13/1945, 67 y.o.   MRN: [id number]  Chief Complaint  Patient presents with  . Other    Eval gallbladder    HPI Brittany Kirk is a 67 y.o. female.   HPI 67 yo WF referred by Dr Magod Evaluation of nausea, abdominal pain. The patient states that her symptoms began with nausea several months ago. She states that she was more nauseous in the morning. She cannot relate her nauseousness to any particular activity or food. She states that she's also had some intermittent pain on her right side. She describes it as sharp pain. It will last for short time. It does not radiate. It can occur randomly. She does have some baseline heartburn and reflux that she's had for many years. She states that she's probably lost about 11 pounds over the past 6 months because of her nausea. She was tried on Phenergan however she stopped taking it because it made her lethargic. She has had a CT scan, an upper endoscopy, a gastric emptying study. She denies any jaundice. She denies any stool caliber changes. She denies any diarrhea or constipation. She has noticed some occasional bloating. She rarely takes NSAIDs. She denies any acholic stools.  She states that her labs were checked at the beginning of the month and she has to get her labs rechecked because her potassium was elevated. Past Medical History  Diagnosis Date  . Hyperlipidemia   . Hypertension   . Depression   . Tobacco user   . History of skin cancer   . Emphysema   . Allergic rhinitis   . Cancer   . CAD (coronary artery disease)     Past Surgical History  Procedure Date  . Tubal ligation   . Tonsilectomy, adenoidectomy, bilateral myringotomy and tubes     Family History  Problem Relation Age of Onset  . Heart disease      5 brothers and 2 sister  . Arthritis Mother   . Diverticulitis Sister     Social History History  Substance Use Topics  . Smoking status: Current Every Day Smoker -- 0.5  packs/day for 40 years    Types: Cigarettes  . Smokeless tobacco: Never Used  . Alcohol Use: No    Allergies  Allergen Reactions  . Cefaclor   . Metronidazole   . Naproxen   . Nsaids   . Penicillins   . Septra (Bactrim)   . Sulfonamide Derivatives     Current Outpatient Prescriptions  Medication Sig Dispense Refill  . escitalopram (LEXAPRO) 10 MG tablet Take 10 mg by mouth daily.        . omeprazole (PRILOSEC) 20 MG capsule Take 20 mg by mouth daily.      . propranolol (INDERAL) 80 MG tablet Take 80 mg by mouth daily.        . tiotropium (SPIRIVA HANDIHALER) 18 MCG inhalation capsule Place 1 capsule (18 mcg total) into inhaler and inhale daily.  90 capsule  3    Review of Systems Review of Systems  Constitutional: Negative for fever, chills and unexpected weight change.  HENT: Negative for hearing loss, congestion, sore throat, trouble swallowing and voice change.   Eyes: Negative for visual disturbance.  Respiratory: Negative for cough, shortness of breath and wheezing.   Cardiovascular: Negative for chest pain, palpitations and leg swelling.       Denies orthopnea, PND, SOB  Gastrointestinal: Positive for nausea and abdominal pain. Negative for   vomiting, diarrhea, constipation, blood in stool, abdominal distention and anal bleeding.  Genitourinary: Negative for hematuria, vaginal bleeding and difficulty urinating.  Musculoskeletal: Negative for arthralgias.  Skin: Negative for rash and wound.  Neurological: Negative for seizures, syncope and headaches.       Denies amaurosis fugax  Hematological: Negative for adenopathy. Does not bruise/bleed easily.  Psychiatric/Behavioral: Negative for confusion. The patient is nervous/anxious.     Blood pressure 146/82, pulse 64, resp. rate 16, height 5' 1" (1.549 m), weight 165 lb (74.844 kg).  Physical Exam Physical Exam  Vitals reviewed. Constitutional: She is oriented to person, place, and time. She appears well-developed and  well-nourished. No distress.  HENT:  Head: Normocephalic and atraumatic.  Right Ear: External ear normal.  Left Ear: External ear normal.  Eyes: Conjunctivae normal are normal. No scleral icterus.  Neck: Normal range of motion. Neck supple. No tracheal deviation present. No thyromegaly present.  Cardiovascular: Normal rate, regular rhythm and normal heart sounds.   Pulmonary/Chest: Effort normal and breath sounds normal. No respiratory distress. She has no wheezes.  Abdominal: Soft. Bowel sounds are normal. She exhibits no distension. There is no tenderness. There is no rebound and no guarding.  Musculoskeletal: She exhibits no edema and no tenderness.  Neurological: She is alert and oriented to person, place, and time. She exhibits normal muscle tone.  Skin: Skin is warm and dry. No rash noted. She is not diaphoretic. No erythema. No pallor.  Psychiatric: She has a normal mood and affect. Her behavior is normal. Judgment and thought content normal.    Data Reviewed CT abd pelvis - gallstones Gastric emptying study - normal EGD 05/29/12 - small hiatal hernia, gastritis, gastric polyp - path: benign; mild inflammation, neg h pylori, malignancy Dr Magod's note Labs from 06/18/12 - nml cbc; nml cmet except for potassium 5.9  Assessment    Symptomatic cholelithiasis Nausea HTN Tobacco use    Plan    I believe most of the patient's symptoms are consistent with gallbladder disease. Since she has had an upper endoscopy which only revealed some mild inflammation and normal gastric emptying study, I believe her symptoms can be attributed to her gallbladder.   We discussed gallbladder disease. The patient was given educational material. We discussed non-operative and operative management. We discussed the signs & symptoms of acute cholecystitis  I discussed laparoscopic cholecystectomy with IOC in detail.  The patient was given educational material as well as diagrams detailing the  procedure.  We discussed the risks and benefits of a laparoscopic cholecystectomy including, but not limited to bleeding, infection, injury to surrounding structures such as the intestine or liver, bile leak, retained gallstones, need to convert to an open procedure, prolonged diarrhea, blood clots such as  DVT, common bile duct injury, anesthesia risks, and possible need for additional procedures.  We discussed the typical post-operative recovery course. I explained that the likelihood of improvement of their symptoms is good. However I did explain that I cannot guarantee that all of her nausea would be ameliorated with a cholecystectomy. The patient has elected to proceed with laparoscopic cholecystectomy  We'll obtain a copy of her labs that she is going to drawn today. I explained that if her potassium is still elevated then we may have to postpone surgery until her hyperkalemia is resolved  Jeslynn Hollander M. Latosha Gaylord, MD, FACS General, Bariatric, & Minimally Invasive Surgery [facility]         Kylie Gros M 07/03/2012, 10:22 AM    

## 2012-07-08 NOTE — Progress Notes (Signed)
Blood pressure now 141/62.

## 2012-07-08 NOTE — Interval H&P Note (Signed)
History and Physical Interval Note:  07/08/2012 7:20 AM  Brittany Kirk  has presented today for surgery, with the diagnosis of symptomatic cholelithiasis  The various methods of treatment have been discussed with the patient and family. After consideration of risks, benefits and other options for treatment, the patient has consented to  Procedure(s) (LRB) with comments: LAPAROSCOPIC CHOLECYSTECTOMY WITH INTRAOPERATIVE CHOLANGIOGRAM (N/A) - Laparoscopic Cholecystectomy with Intraoperative Cholangiogram as a surgical intervention .  The patient's history has been reviewed, patient examined, no change in status, stable for surgery.  I have reviewed the patient's chart and labs.  Questions were answered to the patient's satisfaction.    Mary Sella. Andrey Campanile, MD, FACS General, Bariatric, & Minimally Invasive Surgery [facility], [place]   [facility] M

## 2012-07-08 NOTE — Op Note (Signed)
Laparoscopic Cholecystectomy with IOC Procedure Note  Indications: This patient presents with symptomatic gallbladder disease and will undergo laparoscopic cholecystectomy.  Pre-operative Diagnosis: nausea; symptomatic cholelithiasis  Post-operative Diagnosis: Same  Surgeon: Atilano Ina   Assistants: none  Anesthesia: General endotracheal anesthesia  ASA Class: 3  Procedure Details  The patient was seen again in the Holding Room. The risks, benefits, complications, treatment options, and expected outcomes were discussed with the patient. The possibilities of reaction to medication, pulmonary aspiration, perforation of viscus, bleeding, recurrent infection, finding a normal gallbladder, the need for additional procedures, failure to diagnose a condition, the possible need to convert to an open procedure, and creating a complication requiring transfusion or operation were discussed with the patient. The likelihood of improving the patient's symptoms with return to their baseline status is good.  The patient and/or family concurred with the proposed plan, giving informed consent. The site of surgery properly noted. The patient was taken to Operating Room, identified as Brittany Kirk and the procedure verified as Laparoscopic Cholecystectomy with Intraoperative Cholangiogram. A Time Out was held and the above information confirmed.  Prior to the induction of general anesthesia, antibiotic prophylaxis was administered. General endotracheal anesthesia was then administered and tolerated well. After the induction, the abdomen was prepped with Chloraprep and draped in the sterile fashion. The patient was positioned in the supine position.  Local anesthetic agent was injected into the skin near the umbilicus and an incision made. We dissected down to the abdominal fascia with blunt dissection.  The fascia was incised vertically and we entered the peritoneal cavity bluntly.  A pursestring suture of  0-Vicryl was placed around the fascial opening.  The Hasson cannula was inserted and secured with the stay suture.  Pneumoperitoneum was then created with CO2 and tolerated well without any adverse changes in the patient's vital signs. An 5-mm port was placed in the subxiphoid position.  Two 5-mm ports were placed in the right upper quadrant. All skin incisions were infiltrated with a local anesthetic agent before making the incision and placing the trocars.   We positioned the patient in reverse Trendelenburg, tilted slightly to the patient's left.  The gallbladder was identified, the fundus grasped and retracted cephalad. Adhesions were lysed bluntly and with the electrocautery where indicated, taking care not to injure any adjacent organs or viscus. The infundibulum was grasped and retracted laterally, exposing the peritoneum overlying the triangle of Calot. This was then divided and exposed in a blunt fashion. A critical view of the cystic duct and cystic artery was obtained.  The cystic duct was clearly identified and bluntly dissected circumferentially. The cystic duct was ligated with a clip distally.   An incision was made in the cystic duct and the [facility] cholangiogram catheter introduced. The catheter was secured using a clip. A cholangiogram was then obtained which showed good visualization of the distal and proximal biliary tree with no sign of filling defects or obstruction.  Contrast flowed easily into the duodenum. The catheter was then removed.   The cystic duct was then ligated with clips and divided. The cystic artery was identified, dissected free, ligated with clips and divided as well.   The gallbladder was dissected from the liver bed in retrograde fashion with the electrocautery. The gallbladder was removed and placed in an Endocatch sac.  The gallbladder and Endocatch sac were then removed through the umbilical port site. The liver bed was irrigated and inspected. Hemostasis was achieved  with the electrocautery. Copious irrigation was  utilized and was repeatedly aspirated until clear.  The pursestring suture was used to close the umbilical fascia.    We again inspected the right upper quadrant for hemostasis.  The umbilical closure was inspected and there was no air leak and nothing trapped within the closure. Pneumoperitoneum was released as we removed the trocars.  4-0 Monocryl was used to close the skin.   Dermabond was applied. The patient was then extubated and brought to the recovery room in stable condition. Instrument, sponge, and needle counts were correct at closure and at the conclusion of the case.   Findings: Cholelithiasis; +critical view  Estimated Blood Loss: Minimal         Drains: none         Specimens: Gallbladder           Complications: None; patient tolerated the procedure well.         Disposition: PACU - hemodynamically stable.         Condition: stable  Brittany Kirk. Andrey Campanile, MD, FACS General, Bariatric, & Minimally Invasive Surgery [facility], [place]'

## 2012-07-08 NOTE — Anesthesia Postprocedure Evaluation (Signed)
Anesthesia Post Note  Patient: Brittany Kirk  Procedure(s) Performed: Procedure(s) (LRB): LAPAROSCOPIC CHOLECYSTECTOMY WITH INTRAOPERATIVE CHOLANGIOGRAM (N/A)  Anesthesia type: General  Patient location: PACU  Post pain: Pain level controlled  Post assessment: Post-op Vital signs reviewed  Last Vitals: BP 141/62  Pulse 61  Temp 36.4 C  Resp 14  SpO2 92%  Post vital signs: Reviewed  Level of consciousness: sedated  Complications: No apparent anesthesia complications

## 2012-07-08 NOTE — Transfer of Care (Signed)
Immediate Anesthesia Transfer of Care Note  Patient: Brittany Kirk  Procedure(s) Performed: Procedure(s) (LRB) with comments: LAPAROSCOPIC CHOLECYSTECTOMY WITH INTRAOPERATIVE CHOLANGIOGRAM (N/A) - Laparoscopic Cholecystectomy with Intraoperative Cholangiogram  Patient Location: PACU  Anesthesia Type:General  Level of Consciousness: awake, sedated and patient cooperative  Airway & Oxygen Therapy: Patient Spontanous Breathing and Patient connected to face mask oxygen  Post-op Assessment: Report given to PACU RN and Post -op Vital signs reviewed and stable  Post vital signs: Reviewed and stable  Complications: No apparent anesthesia complications

## 2012-07-08 NOTE — Anesthesia Preprocedure Evaluation (Addendum)
Anesthesia Evaluation  Patient identified by MRN, date of birth, ID band Patient awake    Reviewed: Allergy & Precautions, H&P , NPO status , Patient's Chart, lab work & pertinent test results, reviewed documented beta blocker date and time   Airway Mallampati: I TM Distance: >3 FB Neck ROM: Full    Dental  (+) Dental Advisory Given, Edentulous Upper and Partial Lower   Pulmonary shortness of breath and with exertion, COPD COPD inhaler, Current Smoker,  breath sounds clear to auscultation  Pulmonary exam normal       Cardiovascular hypertension, Pt. on medications and Pt. on home beta blockers + CAD Rhythm:Regular Rate:Normal     Neuro/Psych PSYCHIATRIC DISORDERS Depression negative neurological ROS     GI/Hepatic Neg liver ROS, hiatal hernia, GERD-  Medicated,  Endo/Other  negative endocrine ROS  Renal/GU negative Renal ROS     Musculoskeletal negative musculoskeletal ROS (+)   Abdominal (+) + obese,   Peds  Hematology negative hematology ROS (+)   Anesthesia Other Findings   Reproductive/Obstetrics                          Anesthesia Physical Anesthesia Plan  ASA: III  Anesthesia Plan: General   Post-op Pain Management:    Induction: Intravenous  Airway Management Planned: Oral ETT  Additional Equipment:   Intra-op Plan:   Post-operative Plan: Extubation in OR  Informed Consent: I have reviewed the patients History and Physical, chart, labs and discussed the procedure including the risks, benefits and alternatives for the proposed anesthesia with the patient or authorized representative who has indicated his/her understanding and acceptance.   Dental advisory given  Plan Discussed with: CRNA  Anesthesia Plan Comments:         Anesthesia Quick Evaluation

## 2012-07-09 ENCOUNTER — Encounter (HOSPITAL_COMMUNITY): Payer: Self-pay | Admitting: General Surgery

## 2012-07-18 ENCOUNTER — Encounter (INDEPENDENT_AMBULATORY_CARE_PROVIDER_SITE_OTHER): Payer: Self-pay

## 2012-07-23 ENCOUNTER — Encounter (INDEPENDENT_AMBULATORY_CARE_PROVIDER_SITE_OTHER): Payer: Self-pay

## 2012-07-24 ENCOUNTER — Encounter (INDEPENDENT_AMBULATORY_CARE_PROVIDER_SITE_OTHER): Payer: Medicare Other | Admitting: General Surgery

## 2012-07-25 ENCOUNTER — Encounter (INDEPENDENT_AMBULATORY_CARE_PROVIDER_SITE_OTHER): Payer: Self-pay

## 2012-07-26 ENCOUNTER — Other Ambulatory Visit: Payer: Self-pay

## 2012-07-29 ENCOUNTER — Encounter (INDEPENDENT_AMBULATORY_CARE_PROVIDER_SITE_OTHER): Payer: Self-pay | Admitting: General Surgery

## 2012-07-29 ENCOUNTER — Ambulatory Visit (INDEPENDENT_AMBULATORY_CARE_PROVIDER_SITE_OTHER): Payer: Medicare Other | Admitting: General Surgery

## 2012-07-29 VITALS — BP 110/58 | HR 56 | Temp 98.4°F | Resp 16 | Ht 61.0 in | Wt 163.6 lb

## 2012-07-29 DIAGNOSIS — Z09 Encounter for follow-up examination after completed treatment for conditions other than malignant neoplasm: Secondary | ICD-10-CM

## 2012-07-29 NOTE — Progress Notes (Signed)
Subjective:     Patient ID: Brittany Kirk, female   DOB: [birth year], 67 y.o.   MRN: [id number]  HPI 67 year old Caucasian female comes in for followup after going laparoscopic cholecystectomy with cholangiogram on January 31. She states that she is doing well. She has some mild soreness intermittently in her umbilicus. She also has an occasional loose and watery stools mainly in the morning. It is not related to any particular types of food. She denies any fever, chills, nausea, vomiting, or constipation. She states that she is not smoking. She states that she has not had a cigarette since 2 days before surgery. She states the symptoms she was having preoperatively have resolved.  Review of Systems     Objective:   Physical Exam BP 110/58  Pulse 56  Temp(Src) 98.4 F (36.9 C) (Temporal)  Resp 16  Ht 5\' 1"  (1.549 m)  Wt 163 lb 9.6 oz (74.208 kg)  BMI 30.93 kg/m2  Gen: alert, NAD, non-toxic appearing Pupils: equal, no scleral icterus Abd: soft, nontender, nondistended. Well-healed trocar sites. No cellulitis. No incisional hernia Ext: no edema, Skin: no rash, no jaundice     Assessment:     Status post laparoscopic cholecystectomy with cholangiogram for chronic cholecystitis and cholelithiasis     Plan:     Overall I think she is doing well. We reviewed her pathology report showed chronic cholecystitis and cholelithiasis. She was given a copy of her pathology report. With respect to the intermittent loose and watery stools I encouraged her to keep an eye on it for now. I think we should give it a little bit more time before consideration of cholestyramine. I encouraged her to increase the fiber in her diet. Congratulated her on her smoking cessation. She was instructed to call the office if she had ongoing intermittent loose watery stool after another 4 weeks. Otherwise f/u PRN  Mary Sella. Andrey Campanile, MD, FACS General, Bariatric, & Minimally Invasive Surgery [facility],  [place]

## 2012-07-29 NOTE — Patient Instructions (Signed)
Keep up the good work with the smoking

## 2012-09-01 ENCOUNTER — Encounter: Payer: Self-pay | Admitting: Physician Assistant

## 2012-09-01 ENCOUNTER — Ambulatory Visit (INDEPENDENT_AMBULATORY_CARE_PROVIDER_SITE_OTHER): Payer: Medicare Other | Admitting: Physician Assistant

## 2012-09-01 VITALS — BP 138/72 | HR 52 | Temp 98.6°F | Resp 18 | Ht 60.0 in | Wt 163.0 lb

## 2012-09-01 DIAGNOSIS — J322 Chronic ethmoidal sinusitis: Secondary | ICD-10-CM | POA: Diagnosis not present

## 2012-09-01 MED ORDER — AZITHROMYCIN 250 MG PO TABS: ORAL_TABLET | ORAL | Status: DC

## 2012-09-01 MED ORDER — PREDNISONE 20 MG PO TABS: ORAL_TABLET | ORAL | Status: DC

## 2012-09-01 NOTE — Progress Notes (Signed)
Patient ID: Brittany Kirk MRN: [id number], DOB: 11-19-1945, 67 y.o. Date of Encounter: 09/01/2012, 2:06 PM    Chief Complaint:  Chief Complaint  Patient presents with  . c/o alot sinus drainage, frontal facial pain, discolored dra     HPI: 66 y.o. year old female states that she started feeling sick 5 days ago. At that time, developed sneezing. Then, develpoed thick dark mucus. Has pain in both cheeks and "tooth ache even though I dont have any teeth!" Has drainage down throat causing irritation but no real sore throat. Checked temp twice-got 99.7 and 99.2. No chest congestion.      Home Meds: Current Outpatient Prescriptions on File Prior to Visit  Medication Sig Dispense Refill  . escitalopram (LEXAPRO) 10 MG tablet Take 10 mg by mouth daily with lunch.       Marland Kitchen omeprazole (PRILOSEC OTC) 20 MG tablet Take 20 mg by mouth daily.      . propranolol (INDERAL) 80 MG tablet Take 80 mg by mouth daily before breakfast.       . zinc gluconate 50 MG tablet Take 50 mg by mouth daily.       No current facility-administered medications on file prior to visit.    Allergies:  Allergies  Allergen Reactions  . Cefaclor Itching, Swelling and Rash  . Metronidazole Itching, Swelling and Rash  . Naproxen Itching, Swelling and Rash  . Nsaids Itching, Swelling and Rash  . Penicillins Itching, Swelling and Rash  . Septra (Bactrim) Itching, Swelling and Rash  . Sulfonamide Derivatives Itching, Swelling and Rash      Review of Systems: Constitutional: negative for chills, fever, night sweats, weight changes, or fatigue  HEENT: negative for vision changes, hearing loss, congestion, rhinorrhea, ST, epistaxis. See HPI  Cardiovascular: negative for chest pain or palpitations Respiratory: negative for hemoptysis, wheezing, shortness of breath, or cough Abdominal: negative for abdominal pain, nausea, vomiting, diarrhea, or constipation Dermatological: negative for rash Neurologic: negative for  headache, dizziness, or syncope    Physical Exam: Blood pressure 138/72, pulse 52, temperature 98.6 F (37 C), resp. rate 18, height 5' (1.524 m), weight 163 lb (73.936 kg)., Body mass index is 31.83 kg/(m^2). General: Well developed, well nourished,WF. in no acute distress. HEENT: Normocephalic, atraumatic, eyes without discharge, sclera non-icteric, nares are without discharge. Bilateral auditory canals clear, TM's bilaterally have areas of opaque white scar.  Oral cavity moist, posterior pharynx without exudate, erythema, peritonsillar abscess. Bilateral maxillary sinuses very tender with percussion.   Neck: Supple. No thyromegaly. Full ROM. No lymphadenopathy. Lungs: Clear bilaterally to auscultation without wheezes, rales, or rhonchi. Breathing is unlabored. Heart: RRR with S1 S2. No murmurs, rubs, or gallops appreciated. Msk:  Strength and tone normal for age. Extremities/Skin: Warm and dry. No clubbing or cyanosis. No edema. No rashes or suspicious lesions. Neuro: Alert and oriented X 3. Moves all extremities spontaneously. Gait is normal. CNII-XII grossly in tact. Psych:  Responds to questions appropriately with a normal affect.    ASSESSMENT AND PLAN:  66 y.o. year old female with  1. Ethmoid sinusitis Allery to Penicillins - azithromycin (ZITHROMAX) 250 MG tablet; 2 daily on day one then 1 daily on days 2-5  Dispense: 6 tablet; Refill: 0 - predniSONE (DELTASONE) 20 MG tablet; Take 3 daily for 2 days, then 2 daily for 2 days, then 1 daily for 2 days.  Dispense: 12 tablet; Refill: 0 If symptoms do not resolve, f/u.  [address] [place], [place], [place] 09/01/2012 2:06  PM   

## 2012-09-19 DIAGNOSIS — L565 Disseminated superficial actinic porokeratosis (DSAP): Secondary | ICD-10-CM | POA: Diagnosis not present

## 2012-09-19 DIAGNOSIS — D045 Carcinoma in situ of skin of trunk: Secondary | ICD-10-CM | POA: Diagnosis not present

## 2012-09-19 DIAGNOSIS — Z85828 Personal history of other malignant neoplasm of skin: Secondary | ICD-10-CM | POA: Diagnosis not present

## 2012-09-19 DIAGNOSIS — L570 Actinic keratosis: Secondary | ICD-10-CM | POA: Diagnosis not present

## 2012-09-19 DIAGNOSIS — L723 Sebaceous cyst: Secondary | ICD-10-CM | POA: Diagnosis not present

## 2012-09-19 DIAGNOSIS — F411 Generalized anxiety disorder: Secondary | ICD-10-CM | POA: Diagnosis not present

## 2012-09-19 DIAGNOSIS — F4321 Adjustment disorder with depressed mood: Secondary | ICD-10-CM | POA: Diagnosis not present

## 2012-09-19 DIAGNOSIS — L821 Other seborrheic keratosis: Secondary | ICD-10-CM | POA: Diagnosis not present

## 2012-10-01 DIAGNOSIS — L723 Sebaceous cyst: Secondary | ICD-10-CM | POA: Diagnosis not present

## 2012-10-01 DIAGNOSIS — D045 Carcinoma in situ of skin of trunk: Secondary | ICD-10-CM | POA: Diagnosis not present

## 2012-10-02 ENCOUNTER — Encounter: Payer: Self-pay | Admitting: Family Medicine

## 2012-10-02 ENCOUNTER — Ambulatory Visit (INDEPENDENT_AMBULATORY_CARE_PROVIDER_SITE_OTHER): Payer: Medicare Other | Admitting: Family Medicine

## 2012-10-02 VITALS — BP 166/80 | HR 48 | Resp 16 | Wt 161.0 lb

## 2012-10-02 DIAGNOSIS — I10 Essential (primary) hypertension: Secondary | ICD-10-CM

## 2012-10-02 DIAGNOSIS — I498 Other specified cardiac arrhythmias: Secondary | ICD-10-CM | POA: Diagnosis not present

## 2012-10-02 DIAGNOSIS — R001 Bradycardia, unspecified: Secondary | ICD-10-CM

## 2012-10-02 MED ORDER — LOSARTAN POTASSIUM 50 MG PO TABS: 50.0000 mg | ORAL_TABLET | Freq: Every day | ORAL | Status: DC

## 2012-10-02 NOTE — Progress Notes (Signed)
Subjective:    Patient ID: Brittany Kirk, female    DOB: 07-28-1945, 67 y.o.   MRN: [id number]  HPI  Patient reports fatigue, dizziness, lightheadedness, and elevated blood pressures. At home she's been checking blood pressures were 160-180/80 to 90.  However, her heart rate has been extremely low 40s to 50s.  She denies any chest pain, shortness of breath, or dyspnea on exertion. She does report unusual paresthesias in her arms chest and legs at night when she lies down. She also is having increasing anxiety. Past Medical History  Diagnosis Date  . Hyperlipidemia   . Hypertension   . Depression   . Tobacco user   . History of skin cancer   . Allergic rhinitis   . Cancer   . CAD (coronary artery disease)   . Emphysema     no longer uses inhalers  . Shortness of breath     sob if walks up flight of stairs  . Gallstones     nausea, pain upper right abdomen  . H/O hiatal hernia   . GERD (gastroesophageal reflux disease)   . Complication of anesthesia     sore throat after a surgery   Current Outpatient Prescriptions on File Prior to Visit  Medication Sig Dispense Refill  . Calcium-Vitamin D-Vitamin K (CALCIUM SOFT CHEWS PO) Take 1 each by mouth daily. Takes 1-2 daily when remembers      . cyanocobalamin 2000 MCG tablet Take 2,000 mcg by mouth daily. B12 slow release tablet daily      . omeprazole (PRILOSEC OTC) 20 MG tablet Take 20 mg by mouth daily.      . propranolol (INDERAL) 80 MG tablet Take 80 mg by mouth daily before breakfast.       . azithromycin (ZITHROMAX) 250 MG tablet 2 daily on day one then 1 daily on days 2-5  6 tablet  0  . predniSONE (DELTASONE) 20 MG tablet Take 3 daily for 2 days, then 2 daily for 2 days, then 1 daily for 2 days.  12 tablet  0  . zinc gluconate 50 MG tablet Take 50 mg by mouth daily.       No current facility-administered medications on file prior to visit.   History   Social History  . Marital Status: Married    Spouse Name: N/A   Number of Children: N/A  . Years of Education: N/A   Occupational History  . retired truck Network engineer    Social History Main Topics  . Smoking status: Former Smoker -- 0.50 packs/day for 40 years    Types: Cigarettes  . Smokeless tobacco: Never Used  . Alcohol Use: No  . Drug Use: No  . Sexually Active: Not on file   Other Topics Concern  . Not on file   Social History Narrative  . No narrative on file     Review of Systems     Objective:   Physical Exam  Constitutional: She appears well-developed and well-nourished.  HENT:  Head: Normocephalic.  Eyes: Conjunctivae are normal. Pupils are equal, round, and reactive to light.  Neck: Normal range of motion. Neck supple. No JVD present. No thyromegaly present.  Cardiovascular: Regular rhythm.  Bradycardia present.   Pulmonary/Chest: Effort normal and breath sounds normal. No respiratory distress. She has no wheezes. She has no rales. She exhibits no tenderness.  Lymphadenopathy:    She has no cervical adenopathy.          Assessment & Plan:  1. Bradycardia Past patient to discontinue her Propranolol. She was taking this for essential tremor, but she is getting symptomatic bradycardia due to the medicine.  2. HTN (hypertension) Past patient to start losartan 50 mg by mouth daily. I will recheck her blood pressure and her heart rate here in one week.  She is returns immediately if symptoms worsen.

## 2012-10-08 ENCOUNTER — Ambulatory Visit: Payer: Medicare Other | Admitting: Physician Assistant

## 2012-10-09 ENCOUNTER — Encounter: Payer: Self-pay | Admitting: Family Medicine

## 2012-10-09 ENCOUNTER — Ambulatory Visit (INDEPENDENT_AMBULATORY_CARE_PROVIDER_SITE_OTHER): Payer: Medicare Other | Admitting: Family Medicine

## 2012-10-09 VITALS — BP 120/60 | HR 76 | Temp 98.6°F | Resp 16 | Wt 159.0 lb

## 2012-10-09 DIAGNOSIS — I498 Other specified cardiac arrhythmias: Secondary | ICD-10-CM

## 2012-10-09 DIAGNOSIS — F411 Generalized anxiety disorder: Secondary | ICD-10-CM | POA: Diagnosis not present

## 2012-10-09 DIAGNOSIS — J019 Acute sinusitis, unspecified: Secondary | ICD-10-CM

## 2012-10-09 DIAGNOSIS — I10 Essential (primary) hypertension: Secondary | ICD-10-CM | POA: Diagnosis not present

## 2012-10-09 DIAGNOSIS — R001 Bradycardia, unspecified: Secondary | ICD-10-CM

## 2012-10-09 MED ORDER — AZITHROMYCIN 250 MG PO TABS: ORAL_TABLET | ORAL | Status: DC

## 2012-10-09 NOTE — Progress Notes (Signed)
Subjective:    Patient ID: Brittany Kirk, female    DOB: March 30, 1946, 67 y.o.   MRN: [id number]  HPI  10/02/12 Patient reports fatigue, dizziness, lightheadedness, and elevated blood pressures. At home she's been checking blood pressures were 160-180/80 to 90.  However, her heart rate has been extremely low 40s to 50s.  She denies any chest pain, shortness of breath, or dyspnea on exertion. She does report unusual paresthesias in her arms chest and legs at night when she lies down. She also is having increasing anxiety.  Therefore, at last office visit, we held her propranolol due to the bradycardia and started low side and to control her blood pressure.   10/09/12 Since making these changes her heart rates are ranging 60-70. She feels much better. Her blood pressure is stable 07/11/1938 over 80s. She denies chest pain or shortness of breath.  She does feel more tremulous since stopping the propranolol. She also feels a little more anxious. She may also be having a little more trouble with insomnia. She also has been having symptoms of a sinus infection. She has low-grade fever. She has bilateral maxillary sinus pain. Chest purulent postnasal drip. She has a productive cough. Past Medical History  Diagnosis Date  . Hyperlipidemia   . Hypertension   . Depression   . Tobacco user   . History of skin cancer   . Allergic rhinitis   . Cancer   . CAD (coronary artery disease)   . Emphysema     no longer uses inhalers  . Shortness of breath     sob if walks up flight of stairs  . Gallstones     nausea, pain upper right abdomen  . H/O hiatal hernia   . GERD (gastroesophageal reflux disease)   . Complication of anesthesia     sore throat after a surgery   Current Outpatient Prescriptions on File Prior to Visit  Medication Sig Dispense Refill  . Calcium-Vitamin D-Vitamin K (CALCIUM SOFT CHEWS PO) Take 1 each by mouth daily. Takes 1-2 daily when remembers      . cyanocobalamin 2000 MCG tablet  Take 2,000 mcg by mouth daily. B12 slow release tablet daily      . escitalopram (LEXAPRO) 20 MG tablet Take 20 mg by mouth daily.      Marland Kitchen losartan (COZAAR) 50 MG tablet Take 1 tablet (50 mg total) by mouth daily.  30 tablet  3  . omeprazole (PRILOSEC OTC) 20 MG tablet Take 20 mg by mouth daily.      Marland Kitchen zinc gluconate 50 MG tablet Take 50 mg by mouth daily.       No current facility-administered medications on file prior to visit.   History   Social History  . Marital Status: Married    Spouse Name: N/A    Number of Children: N/A  . Years of Education: N/A   Occupational History  . retired truck Network engineer    Social History Main Topics  . Smoking status: Former Smoker -- 0.50 packs/day for 40 years    Types: Cigarettes  . Smokeless tobacco: Never Used  . Alcohol Use: No  . Drug Use: No  . Sexually Active: Not on file   Other Topics Concern  . Not on file   Social History Narrative  . No narrative on file     Review of Systems      Objective:   Physical Exam  Constitutional: She appears well-developed and well-nourished.  HENT:  Head: Normocephalic.  Eyes: Conjunctivae are normal. Pupils are equal, round, and reactive to light.  Neck: Normal range of motion. Neck supple. No JVD present. No thyromegaly present.  Cardiovascular: Regular rhythm.  Bradycardia present.   Pulmonary/Chest: Effort normal and breath sounds normal. No respiratory distress. She has no wheezes. She has no rales. She exhibits no tenderness.  Lymphadenopathy:    She has no cervical adenopathy.    Bradycardias no longer present. However now today on exam she is having rhinorrhea. She also has bimaxillary sinus tenderness palpation. Chest decreased breath sounds. There is faint expiratory wheezes.      Assessment & Plan:  1. Acute rhinosinusitis - azithromycin (ZITHROMAX) 250 MG tablet; 2 tabs on day 1 1 tab on day 2-5  Dispense: 6 tablet; Refill: 0  2. Bradycardia Now resolved, continue  to hold the propranolol.  3. Anxiety state, unspecified Asked the patient to give some time to let her body adjust to changes. She continues to feel anxious and experienced the essential tremor, we could consider starting a low-dose of Klonopin for both.  4. HTN (hypertension) Adequately controlled continue losartan. Followup in one week if no better

## 2012-10-22 ENCOUNTER — Other Ambulatory Visit: Payer: Self-pay | Admitting: Gastroenterology

## 2012-10-22 DIAGNOSIS — Z86010 Personal history of colonic polyps: Secondary | ICD-10-CM | POA: Diagnosis not present

## 2012-10-22 DIAGNOSIS — Z09 Encounter for follow-up examination after completed treatment for conditions other than malignant neoplasm: Secondary | ICD-10-CM | POA: Diagnosis not present

## 2012-10-22 DIAGNOSIS — D126 Benign neoplasm of colon, unspecified: Secondary | ICD-10-CM | POA: Diagnosis not present

## 2012-10-27 DIAGNOSIS — I498 Other specified cardiac arrhythmias: Secondary | ICD-10-CM | POA: Diagnosis not present

## 2012-10-27 DIAGNOSIS — F4321 Adjustment disorder with depressed mood: Secondary | ICD-10-CM | POA: Diagnosis not present

## 2012-10-27 DIAGNOSIS — R209 Unspecified disturbances of skin sensation: Secondary | ICD-10-CM | POA: Diagnosis not present

## 2012-10-28 ENCOUNTER — Encounter: Payer: Self-pay | Admitting: Physician Assistant

## 2012-11-27 ENCOUNTER — Ambulatory Visit (INDEPENDENT_AMBULATORY_CARE_PROVIDER_SITE_OTHER): Payer: Medicare Other | Admitting: Diagnostic Neuroimaging

## 2012-11-27 ENCOUNTER — Encounter: Payer: Self-pay | Admitting: Diagnostic Neuroimaging

## 2012-11-27 VITALS — BP 137/59 | HR 64 | Ht 61.0 in | Wt 162.0 lb

## 2012-11-27 DIAGNOSIS — G250 Essential tremor: Secondary | ICD-10-CM

## 2012-11-27 DIAGNOSIS — R200 Anesthesia of skin: Secondary | ICD-10-CM

## 2012-11-27 DIAGNOSIS — R209 Unspecified disturbances of skin sensation: Secondary | ICD-10-CM | POA: Diagnosis not present

## 2012-11-27 DIAGNOSIS — R42 Dizziness and giddiness: Secondary | ICD-10-CM | POA: Diagnosis not present

## 2012-11-27 DIAGNOSIS — G252 Other specified forms of tremor: Secondary | ICD-10-CM | POA: Diagnosis not present

## 2012-11-27 DIAGNOSIS — Z79899 Other long term (current) drug therapy: Secondary | ICD-10-CM | POA: Diagnosis not present

## 2012-11-27 DIAGNOSIS — Z5189 Encounter for other specified aftercare: Secondary | ICD-10-CM | POA: Diagnosis not present

## 2012-11-27 DIAGNOSIS — R6889 Other general symptoms and signs: Secondary | ICD-10-CM | POA: Diagnosis not present

## 2012-11-27 NOTE — Progress Notes (Signed)
[facility]  PATIENT: Brittany Kirk DOB: 24-Feb-1946  REFERRING CLINICIAN: Grewal HISTORY FROM: patient  REASON FOR VISIT: new consult   HISTORICAL  CHIEF COMPLAINT:  Chief Complaint  Patient presents with  . Neurologic Problem    NP#6    HISTORY OF PRESENT ILLNESS:   67 year old right-handed female here for evaluation of numbness and tingling, balance difficulty dizziness.  Since jittery 2014, when patient quit smoking, patient has had increasing problems with dizziness, balance, tingling sensation in head and scalp, including an episode of tingling and burning sensation in her chest and arms. Symptoms fluctuate. Dizziness seems to occur when she tilts her head or to the side. Her balance is getting worse over time.  Patient also has remote history of REM behavior sleep disorder, with violent themes, acting them out in her sleep, previously treated with Klonopin. This is possibly 5 or 6 years ago. She is no longer and Klonopin.  Patient also has history of postural tremor in her hands and head, diagnosed with essential tremor. She was treated with propranolol the past, this had to be stopped the to bradycardia. She has not tried primidone.  Patient doesn't endorse some anxiety-type symptoms, currently on Lexapro.  REVIEW OF SYSTEMS: Full 14 system review of systems performed and notable only for fatigue swelling and leg spinning sensation allergy feeling cold easy bruising headache weakness slurred speech dizziness tremor restless legs anxiety.  ALLERGIES: Allergies  Allergen Reactions  . Cefaclor Itching, Swelling and Rash  . Metronidazole Itching, Swelling and Rash  . Naproxen Itching, Swelling and Rash  . Nsaids Itching, Swelling and Rash  . Penicillins Itching, Swelling and Rash  . Septra (Bactrim) Itching, Swelling and Rash  . Sulfonamide Derivatives Itching, Swelling and Rash    HOME MEDICATIONS: Outpatient Prescriptions Prior to Visit    Medication Sig Dispense Refill  . Calcium-Vitamin D-Vitamin K (CALCIUM SOFT CHEWS PO) Take 1 each by mouth daily. Takes 1-2 daily when remembers      . cyanocobalamin 2000 MCG tablet Take 2,000 mcg by mouth daily. B12 slow release tablet daily      . escitalopram (LEXAPRO) 20 MG tablet Take 20 mg by mouth daily.      Marland Kitchen losartan (COZAAR) 50 MG tablet Take 1 tablet (50 mg total) by mouth daily.  30 tablet  3  . omeprazole (PRILOSEC OTC) 20 MG tablet Take 20 mg by mouth daily.      Marland Kitchen zinc gluconate 50 MG tablet Take 50 mg by mouth daily.      Marland Kitchen azithromycin (ZITHROMAX) 250 MG tablet 2 tabs on day 1 1 tab on day 2-5  6 tablet  0   No facility-administered medications prior to visit.    PAST MEDICAL HISTORY: Past Medical History  Diagnosis Date  . Hyperlipidemia   . Hypertension   . Depression   . Tobacco user   . History of skin cancer   . Allergic rhinitis   . Cancer   . CAD (coronary artery disease)   . Emphysema     no longer uses inhalers  . Shortness of breath     sob if walks up flight of stairs  . Gallstones     nausea, pain upper right abdomen  . H/O hiatal hernia   . GERD (gastroesophageal reflux disease)   . Complication of anesthesia     sore throat after a surgery    PAST SURGICAL HISTORY: Past Surgical History  Procedure Laterality Date  . Tubal  ligation    . Tonsilectomy, adenoidectomy, bilateral myringotomy and tubes    . Cholecystectomy  07/08/2012    Procedure: LAPAROSCOPIC CHOLECYSTECTOMY WITH INTRAOPERATIVE CHOLANGIOGRAM;  Surgeon: Atilano Ina, MD,FACS;  Location: [facility];  Service: General;  Laterality: N/A;  Laparoscopic Cholecystectomy with Intraoperative Cholangiogram    FAMILY HISTORY: Family History  Problem Relation Age of Onset  . Heart disease      5 brothers and 2 sister  . Arthritis Mother   . Diverticulitis Sister     SOCIAL HISTORY:  History   Social History  . Marital Status: Married    Spouse Name: N/A    Number of Children:  N/A  . Years of Education: N/A   Occupational History  . retired truck Network engineer    Social History Main Topics  . Smoking status: Former Smoker -- 0.50 packs/day for 40 years    Types: Cigarettes  . Smokeless tobacco: Never Used  . Alcohol Use: No  . Drug Use: No  . Sexually Active: Not on file   Other Topics Concern  . Not on file   Social History Narrative  . No narrative on file     PHYSICAL EXAM  Filed Vitals:   11/27/12 1411  BP: 137/59  Pulse: 64  Height: 5\' 1"  (1.549 m)  Weight: 162 lb (73.483 kg)    Not recorded    Body mass index is 30.63 kg/(m^2).  GENERAL EXAM: Patient is in no distress  CARDIOVASCULAR: Regular rate and rhythm, no murmurs, no carotid bruits  NEUROLOGIC: MENTAL STATUS: awake, alert, language fluent, comprehension intact, naming intact CRANIAL NERVE: no papilledema on fundoscopic exam, pupils equal and reactive to light, visual fields full to confrontation, extraocular muscles intact, no nystagmus, facial sensation and strength symmetric, uvula midline, shoulder shrug symmetric, tongue midline. MOTOR: POSTURAL TREMOR OF HEAD AND HANDS. normal bulk and tone, full strength in the BUE, BLE SENSORY: VIBRATION 7 SEC AT TOES. COORDINATION: finger-nose-finger, fine finger movements normal REFLEXES: BUE TRACE, BLE ABSENT. GAIT/STATION: narrow based gait; DIFF WITH TOE, HEEL AND TANDEM GAIT. Romberg is negative   DIAGNOSTIC DATA (LABS, IMAGING, TESTING) - I reviewed patient records, labs, notes, testing and imaging myself where available.  Lab Results  Component Value Date   WBC 7.8 07/07/2012   HGB 14.6 07/07/2012   HCT 44.1 07/07/2012   MCV 97.8 07/07/2012   PLT 216 07/07/2012      Component Value Date/Time   NA 143 07/07/2012 0930   K 4.2 07/07/2012 0930   CL 106 07/07/2012 0930   CO2 30 07/07/2012 0930   GLUCOSE 94 07/07/2012 0930   BUN 16 07/07/2012 0930   CREATININE 0.88 07/07/2012 0930   CALCIUM 9.1 07/07/2012 0930   PROT 6.9  11/09/2008 0859   ALBUMIN 3.8 11/09/2008 0859   AST 19 11/09/2008 0859   ALT 11 11/09/2008 0859   ALKPHOS 72 11/09/2008 0859   BILITOT 1.0 11/09/2008 0859   GFRNONAA 66* 07/07/2012 0930   GFRAA 76* 07/07/2012 0930   Lab Results  Component Value Date   CHOL 132 11/09/2008   HDL 47.60 11/09/2008   LDLCALC 66 11/09/2008   TRIG 91.0 11/09/2008   CHOLHDL 3 11/09/2008   No results found for this basename: HGBA1C   No results found for this basename: VITAMINB12   No results found for this basename: TSH    12/04/03 MRI brain - No acute infarct or abnormal intracranial enhancing lesions. Minimal nonspecific white matter-type changes.   ASSESSMENT AND  PLAN  67 y.o. year old female  has a past medical history of Hyperlipidemia; Hypertension; Depression; Tobacco user; History of skin cancer; Allergic rhinitis; Cancer; CAD (coronary artery disease); Emphysema; Shortness of breath; Gallstones; H/O hiatal hernia; GERD (gastroesophageal reflux disease); and Complication of anesthesia. here with numbness, postural dizziness, balance difficulty, areflexia in the lower extremities, postural tremor, anxiety. I will check blood testing for causes for neuropathy. Regarding tremor, symptoms are mild and not bothering her that much. We will hold off on additional medications for tremor control at this time. Agree with treatment for anxiety as currently being managed.   Orders Placed This Encounter  Procedures  . Vitamin B12  . TSH  . Hemoglobin A1c    Suanne Marker, MD 11/27/2012, 3:20 PM Certified in Neurology, Neurophysiology and Neuroimaging  [facility] [address] [place], [place] [address] [phone]

## 2012-11-27 NOTE — Patient Instructions (Signed)
I will check lab testing and refer to PT.

## 2012-11-28 LAB — HEMOGLOBIN A1C
Est. average glucose Bld gHb Est-mCnc: 114 mg/dL
Hgb A1c MFr Bld: 5.6 % (ref 4.8–5.6)

## 2012-11-28 LAB — VITAMIN B12: Vitamin B-12: 1381 pg/mL — ABNORMAL HIGH (ref 211–946)

## 2012-11-28 LAB — TSH: TSH: 0.745 u[IU]/mL (ref 0.450–4.500)

## 2012-12-01 ENCOUNTER — Telehealth: Payer: Self-pay

## 2012-12-01 NOTE — Telephone Encounter (Signed)
Spoke to pt, gave normal lab results.

## 2012-12-08 ENCOUNTER — Encounter: Payer: Self-pay | Admitting: Physician Assistant

## 2012-12-08 ENCOUNTER — Ambulatory Visit (INDEPENDENT_AMBULATORY_CARE_PROVIDER_SITE_OTHER): Payer: Medicare Other | Admitting: Physician Assistant

## 2012-12-08 VITALS — BP 110/68 | HR 72 | Temp 98.3°F | Resp 16 | Wt 162.0 lb

## 2012-12-08 DIAGNOSIS — H6691 Otitis media, unspecified, right ear: Secondary | ICD-10-CM

## 2012-12-08 DIAGNOSIS — H6690 Otitis media, unspecified, unspecified ear: Secondary | ICD-10-CM

## 2012-12-08 MED ORDER — AZITHROMYCIN 250 MG PO TABS: ORAL_TABLET | ORAL | Status: DC

## 2012-12-08 NOTE — Progress Notes (Signed)
Patient ID: Brittany Kirk MRN: [id number], DOB: 15-Sep-1945, 66 y.o. Date of Encounter: 12/08/2012, 12:28 PM    Chief Complaint:  Chief Complaint  Patient presents with  . Otalgia    Right     HPI: 67 y.o. year old white female here for evaluation of her ear. Says right ear "feels like there's fluid behind the ear." Has felt this for well over one week and for past one week has now also developed pain in the ear.  Report that "she had a lot of ear infections as a child and always has problems with her ears." Has had no nasal mucus. No sore throat, no chest congestion, no fever/chills.   Home Meds: See attached medication section for any medications that were entered at today's visit. The computer does not put those onto this list.The following list is a list of meds entered prior to today's visit.   Current Outpatient Prescriptions on File Prior to Visit  Medication Sig Dispense Refill  . Calcium-Vitamin D-Vitamin K (CALCIUM SOFT CHEWS PO) Take 1 each by mouth daily. Takes 1-2 daily when remembers      . cyanocobalamin 2000 MCG tablet Take 2,000 mcg by mouth daily. B12 slow release tablet daily      . escitalopram (LEXAPRO) 20 MG tablet Take 20 mg by mouth daily.      Marland Kitchen losartan (COZAAR) 50 MG tablet Take 1 tablet (50 mg total) by mouth daily.  30 tablet  3  . omeprazole (PRILOSEC OTC) 20 MG tablet Take 20 mg by mouth daily.      Marland Kitchen VITAMIN E PO Take 3,000 mcg by mouth daily.       No current facility-administered medications on file prior to visit.    Allergies:  Allergies  Allergen Reactions  . Cefaclor Itching, Swelling and Rash  . Metronidazole Itching, Swelling and Rash  . Naproxen Itching, Swelling and Rash  . Nsaids Itching, Swelling and Rash  . Penicillins Itching, Swelling and Rash  . Septra (Bactrim) Itching, Swelling and Rash  . Sulfonamide Derivatives Itching, Swelling and Rash      Review of Systems: See HPI for pertinent ROS. All other ROS negative.     Physical Exam: Blood pressure 110/68, pulse 72, temperature 98.3 F (36.8 C), temperature source Oral, resp. rate 16, weight 162 lb (73.483 kg)., Body mass index is 30.63 kg/(m^2). General: WNWD WF.  Appears in no acute distress. HEENT: Normocephalic, atraumatic, eyes without discharge, sclera non-icteric, nares are without discharge. Bilateral auditory canals clear, TM's are without perforation. Both TMs have a lot of opaque white scar which makes exam difficult. I am unable to see any erythema or effusion.  Oral cavity moist, posterior pharynx without exudate, erythema, peritonsillar abscess, or post nasal drip. No tenderness with percussion of sinuses. Neck: Supple. No thyromegaly. No lymphadenopathy. Lungs: Clear bilaterally to auscultation without wheezes, rales, or rhonchi. Breathing is unlabored. Heart: Regular rhythm. No murmurs, rubs, or gallops. Msk:  Strength and tone normal for age. Neuro: Alert and oriented X 3. Moves all extremities spontaneously. Gait is normal. CNII-XII grossly in tact. Psych:  Responds to questions appropriately with a normal affect.     ASSESSMENT AND PLAN:  67 y.o. year old female with  1. Right otitis media She has h/o allergic reactions to Penicillins. She says sh know she cannot take Augmentin.  Also allergic to Sulfa drugs. Also allergic to Cephalosporins.  So, cannot use Augmentin or Omnicef. She says she knows she can take -  mycins. Wil use this. If symptoms do not resolve, f/u. ( could use Levaquin if have to) - azithromycin (ZITHROMAX) 250 MG tablet; Day 1: Take 2 daily.  Days 2-5; Take 1 daily  Dispense: 6 tablet; Refill: 0   Signed, [address] [place], [place], [facility] 12/08/2012 12:28 PM

## 2012-12-11 ENCOUNTER — Telehealth: Payer: Self-pay | Admitting: Family Medicine

## 2012-12-11 MED ORDER — PROPRANOLOL HCL 80 MG PO TABS: 80.0000 mg | ORAL_TABLET | Freq: Every day | ORAL | Status: DC

## 2012-12-11 NOTE — Telephone Encounter (Signed)
Rx Refilled  

## 2012-12-29 ENCOUNTER — Ambulatory Visit
Payer: Medicare Other | Attending: Diagnostic Neuroimaging | Admitting: Rehabilitative and Restorative Service Providers"

## 2012-12-29 DIAGNOSIS — R269 Unspecified abnormalities of gait and mobility: Secondary | ICD-10-CM | POA: Diagnosis not present

## 2012-12-29 DIAGNOSIS — IMO0001 Reserved for inherently not codable concepts without codable children: Secondary | ICD-10-CM | POA: Insufficient documentation

## 2013-01-05 ENCOUNTER — Ambulatory Visit (INDEPENDENT_AMBULATORY_CARE_PROVIDER_SITE_OTHER): Payer: Medicare Other | Admitting: Family Medicine

## 2013-01-05 ENCOUNTER — Encounter: Payer: Self-pay | Admitting: Family Medicine

## 2013-01-05 VITALS — BP 132/78 | HR 76 | Temp 98.0°F | Resp 18 | Wt 164.0 lb

## 2013-01-05 DIAGNOSIS — R0989 Other specified symptoms and signs involving the circulatory and respiratory systems: Secondary | ICD-10-CM

## 2013-01-05 DIAGNOSIS — H8110 Benign paroxysmal vertigo, unspecified ear: Secondary | ICD-10-CM

## 2013-01-05 NOTE — Progress Notes (Signed)
Subjective:    Patient ID: Brittany Kirk, female    DOB: [birth year], 67 y.o.   MRN: [id number]  HPI Patient presents with 6 months of episodic dizziness. She describes vertigo-like symptoms whenever she turns her head to the right or to the left looks up or looks down. His symptoms do not occur every time that she does this, however the dizziness is always associated with movement of the head.  She denies any syncope or presyncope. She denies any tinnitus. She denies any hearing loss. She denies any ear pain or sinus pain.  She denies any fevers, chills, headaches, or vision changes. Past Medical History  Diagnosis Date  . Hyperlipidemia   . Hypertension   . Depression   . Tobacco user   . History of skin cancer   . Allergic rhinitis   . Cancer   . CAD (coronary artery disease)   . Emphysema     no longer uses inhalers  . Shortness of breath     sob if walks up flight of stairs  . Gallstones     nausea, pain upper right abdomen  . H/O hiatal hernia   . GERD (gastroesophageal reflux disease)   . Complication of anesthesia     sore throat after a surgery   Current Outpatient Prescriptions on File Prior to Visit  Medication Sig Dispense Refill  . Calcium-Vitamin D-Vitamin K (CALCIUM SOFT CHEWS PO) Take 1 each by mouth daily. Takes 1-2 daily when remembers      . Cholecalciferol (VITAMIN D3) 5000 UNITS CAPS Take 5,000 Units by mouth daily.      . cyanocobalamin 2000 MCG tablet Take 2,000 mcg by mouth daily. B12 slow release tablet daily      . escitalopram (LEXAPRO) 20 MG tablet Take 20 mg by mouth daily.      Marland Kitchen losartan (COZAAR) 50 MG tablet Take 1 tablet (50 mg total) by mouth daily.  30 tablet  3  . omeprazole (PRILOSEC OTC) 20 MG tablet Take 20 mg by mouth daily.      Marland Kitchen VITAMIN E PO Take 3,000 mcg by mouth daily.       No current facility-administered medications on file prior to visit.   Allergies  Allergen Reactions  . Cefaclor Itching, Swelling and Rash  . Metronidazole  Itching, Swelling and Rash  . Naproxen Itching, Swelling and Rash  . Nsaids Itching, Swelling and Rash  . Penicillins Itching, Swelling and Rash  . Septra (Bactrim) Itching, Swelling and Rash  . Sulfonamide Derivatives Itching, Swelling and Rash   History   Social History  . Marital Status: Married    Spouse Name: N/A    Number of Children: N/A  . Years of Education: N/A   Occupational History  . retired truck Network engineer    Social History Main Topics  . Smoking status: Former Smoker -- 0.50 packs/day for 40 years    Types: Cigarettes  . Smokeless tobacco: Never Used  . Alcohol Use: No  . Drug Use: No  . Sexually Active: Not on file   Other Topics Concern  . Not on file   Social History Narrative  . No narrative on file      Review of Systems  All other systems reviewed and are negative.       Objective:   Physical Exam  Constitutional: She is oriented to person, place, and time.  Neck: Neck supple. No JVD present.  Cardiovascular: Normal rate, regular rhythm, normal heart sounds  and intact distal pulses.  Exam reveals no gallop.   No murmur heard. Pulmonary/Chest: Effort normal and breath sounds normal. No respiratory distress. She has no wheezes. She has no rales.  Lymphadenopathy:    She has no cervical adenopathy.  Neurological: She is alert and oriented to person, place, and time. She has normal reflexes. She displays normal reflexes. No cranial nerve deficit. She exhibits normal muscle tone. Coordination normal.   patient has a right carotid bruit. She also has a positive Dix-Hallpike test to the right.        Assessment & Plan:  1. BPPV (benign paroxysmal positional vertigo) Spent 10 minutes explaining the diagnosis. I gave the patient a handout on Epley maneuvers to perform at home. If the symptoms worsen she can use meclizine as needed.  Anticipate self limited resolution  2. Carotid bruit Incidental finding on today's exam. Schedule patient  for carotid Dopplers. - US Carotid Duplex Bilateral; Future

## 2013-01-08 ENCOUNTER — Ambulatory Visit
Admission: RE | Admit: 2013-01-08 | Discharge: 2013-01-08 | Disposition: A | Discharge status: Home / Self Care | Payer: Medicare Other | Source: Ambulatory Visit | Attending: Family Medicine | Admitting: Family Medicine

## 2013-01-08 DIAGNOSIS — R0989 Other specified symptoms and signs involving the circulatory and respiratory systems: Secondary | ICD-10-CM

## 2013-01-08 DIAGNOSIS — I658 Occlusion and stenosis of other precerebral arteries: Secondary | ICD-10-CM | POA: Diagnosis not present

## 2013-01-09 ENCOUNTER — Encounter: Payer: Self-pay | Admitting: Family Medicine

## 2013-01-09 ENCOUNTER — Ambulatory Visit
Payer: Medicare Other | Attending: Diagnostic Neuroimaging | Admitting: Rehabilitative and Restorative Service Providers"

## 2013-01-09 DIAGNOSIS — R269 Unspecified abnormalities of gait and mobility: Secondary | ICD-10-CM | POA: Diagnosis not present

## 2013-01-09 DIAGNOSIS — IMO0001 Reserved for inherently not codable concepts without codable children: Secondary | ICD-10-CM | POA: Diagnosis not present

## 2013-01-12 ENCOUNTER — Ambulatory Visit: Payer: Medicare Other | Admitting: Rehabilitative and Restorative Service Providers"

## 2013-01-12 DIAGNOSIS — IMO0001 Reserved for inherently not codable concepts without codable children: Secondary | ICD-10-CM | POA: Diagnosis not present

## 2013-01-12 DIAGNOSIS — R269 Unspecified abnormalities of gait and mobility: Secondary | ICD-10-CM | POA: Diagnosis not present

## 2013-01-14 ENCOUNTER — Other Ambulatory Visit: Payer: Self-pay

## 2013-01-19 ENCOUNTER — Ambulatory Visit: Payer: Medicare Other | Admitting: Physical Therapy

## 2013-01-19 DIAGNOSIS — R269 Unspecified abnormalities of gait and mobility: Secondary | ICD-10-CM | POA: Diagnosis not present

## 2013-01-19 DIAGNOSIS — IMO0001 Reserved for inherently not codable concepts without codable children: Secondary | ICD-10-CM | POA: Diagnosis not present

## 2013-01-22 ENCOUNTER — Other Ambulatory Visit: Payer: Medicare Other

## 2013-01-22 ENCOUNTER — Other Ambulatory Visit: Payer: Self-pay | Admitting: Family Medicine

## 2013-01-22 DIAGNOSIS — E785 Hyperlipidemia, unspecified: Secondary | ICD-10-CM

## 2013-01-22 DIAGNOSIS — I2510 Atherosclerotic heart disease of native coronary artery without angina pectoris: Secondary | ICD-10-CM

## 2013-01-22 DIAGNOSIS — I25810 Atherosclerosis of coronary artery bypass graft(s) without angina pectoris: Secondary | ICD-10-CM

## 2013-01-22 LAB — COMPREHENSIVE METABOLIC PANEL
ALT: 12 U/L (ref 0–35)
AST: 15 U/L (ref 0–37)
Albumin: 3.9 g/dL (ref 3.5–5.2)
Alkaline Phosphatase: 76 U/L (ref 39–117)
BUN: 18 mg/dL (ref 6–23)
CO2: 28 mEq/L (ref 19–32)
Calcium: 8.9 mg/dL (ref 8.4–10.5)
Chloride: 103 mEq/L (ref 96–112)
Creat: 0.94 mg/dL (ref 0.50–1.10)
Glucose, Bld: 94 mg/dL (ref 70–99)
Potassium: 4.3 mEq/L (ref 3.5–5.3)
Sodium: 140 mEq/L (ref 135–145)
Total Bilirubin: 0.7 mg/dL (ref 0.3–1.2)
Total Protein: 6.4 g/dL (ref 6.0–8.3)

## 2013-01-22 LAB — CBC WITH DIFFERENTIAL/PLATELET
Basophils Absolute: 0 10*3/uL (ref 0.0–0.1)
Basophils Relative: 0 % (ref 0–1)
Eosinophils Absolute: 0 10*3/uL (ref 0.0–0.7)
Eosinophils Relative: 1 % (ref 0–5)
HCT: 37.6 % (ref 36.0–46.0)
Hemoglobin: 12.8 g/dL (ref 12.0–15.0)
Lymphocytes Relative: 31 % (ref 12–46)
Lymphs Abs: 2.1 10*3/uL (ref 0.7–4.0)
MCH: 31.8 pg (ref 26.0–34.0)
MCHC: 34 g/dL (ref 30.0–36.0)
MCV: 93.3 fL (ref 78.0–100.0)
Monocytes Absolute: 0.3 10*3/uL (ref 0.1–1.0)
Monocytes Relative: 5 % (ref 3–12)
Neutro Abs: 4.3 10*3/uL (ref 1.7–7.7)
Neutrophils Relative %: 63 % (ref 43–77)
Platelets: 257 10*3/uL (ref 150–400)
RBC: 4.03 MIL/uL (ref 3.87–5.11)
RDW: 14.8 % (ref 11.5–15.5)
WBC: 6.7 10*3/uL (ref 4.0–10.5)

## 2013-01-22 LAB — LIPID PANEL
Cholesterol: 196 mg/dL (ref 0–200)
HDL: 56 mg/dL (ref >39)
LDL Cholesterol: 117 mg/dL — ABNORMAL HIGH (ref 0–99)
Total CHOL/HDL Ratio: 3.5 Ratio
Triglycerides: 115 mg/dL (ref <150)
VLDL: 23 mg/dL (ref 0–40)

## 2013-01-23 ENCOUNTER — Other Ambulatory Visit: Payer: Self-pay | Admitting: Family Medicine

## 2013-01-23 MED ORDER — ATORVASTATIN CALCIUM 20 MG PO TABS: 20.0000 mg | ORAL_TABLET | Freq: Every day | ORAL | Status: DC

## 2013-01-23 NOTE — Telephone Encounter (Signed)
Med sent to pharm 

## 2013-01-26 ENCOUNTER — Ambulatory Visit: Payer: Medicare Other | Admitting: Rehabilitative and Restorative Service Providers"

## 2013-01-26 DIAGNOSIS — R269 Unspecified abnormalities of gait and mobility: Secondary | ICD-10-CM | POA: Diagnosis not present

## 2013-01-26 DIAGNOSIS — IMO0001 Reserved for inherently not codable concepts without codable children: Secondary | ICD-10-CM | POA: Diagnosis not present

## 2013-01-28 ENCOUNTER — Other Ambulatory Visit: Payer: Self-pay | Admitting: Family Medicine

## 2013-02-02 ENCOUNTER — Encounter: Payer: Medicare Other | Admitting: Rehabilitative and Restorative Service Providers"

## 2013-02-23 DIAGNOSIS — L570 Actinic keratosis: Secondary | ICD-10-CM | POA: Diagnosis not present

## 2013-02-23 DIAGNOSIS — L821 Other seborrheic keratosis: Secondary | ICD-10-CM | POA: Diagnosis not present

## 2013-02-23 DIAGNOSIS — Z85828 Personal history of other malignant neoplasm of skin: Secondary | ICD-10-CM | POA: Diagnosis not present

## 2013-02-23 DIAGNOSIS — D239 Other benign neoplasm of skin, unspecified: Secondary | ICD-10-CM | POA: Diagnosis not present

## 2013-03-30 ENCOUNTER — Ambulatory Visit: Payer: Medicare Other | Admitting: Diagnostic Neuroimaging

## 2013-04-16 ENCOUNTER — Other Ambulatory Visit: Payer: Self-pay

## 2013-04-30 ENCOUNTER — Telehealth: Payer: Self-pay | Admitting: Diagnostic Neuroimaging

## 2013-04-30 NOTE — Telephone Encounter (Signed)
Called [facility] to give them a Dx code for the patient. The code that was given per Andrey Campanile, [place] was V58.69. The person that I spoke with name was Pane and she stated that one of the codes that they had did go through which was V58.89. The representative Pane entered both Dx codes. I advise Pane if there were any other questions or concerns to call the office.

## 2013-05-04 DIAGNOSIS — Z23 Encounter for immunization: Secondary | ICD-10-CM | POA: Diagnosis not present

## 2013-05-27 ENCOUNTER — Other Ambulatory Visit: Payer: Self-pay | Admitting: Family Medicine

## 2013-06-11 DIAGNOSIS — C50919 Malignant neoplasm of unspecified site of unspecified female breast: Secondary | ICD-10-CM

## 2013-06-11 HISTORY — DX: Malignant neoplasm of unspecified site of unspecified female breast: C50.919

## 2013-06-11 HISTORY — PX: BREAST LUMPECTOMY: SHX2

## 2013-06-19 ENCOUNTER — Other Ambulatory Visit: Payer: Self-pay | Admitting: Dermatology

## 2013-06-19 DIAGNOSIS — D1801 Hemangioma of skin and subcutaneous tissue: Secondary | ICD-10-CM | POA: Diagnosis not present

## 2013-06-19 DIAGNOSIS — L739 Follicular disorder, unspecified: Secondary | ICD-10-CM | POA: Diagnosis not present

## 2013-06-19 DIAGNOSIS — L821 Other seborrheic keratosis: Secondary | ICD-10-CM | POA: Diagnosis not present

## 2013-06-19 DIAGNOSIS — L570 Actinic keratosis: Secondary | ICD-10-CM | POA: Diagnosis not present

## 2013-06-19 DIAGNOSIS — L820 Inflamed seborrheic keratosis: Secondary | ICD-10-CM | POA: Diagnosis not present

## 2013-06-19 DIAGNOSIS — D485 Neoplasm of uncertain behavior of skin: Secondary | ICD-10-CM | POA: Diagnosis not present

## 2013-06-30 ENCOUNTER — Telehealth: Payer: Self-pay | Admitting: Family Medicine

## 2013-06-30 ENCOUNTER — Encounter: Payer: Self-pay | Admitting: Family Medicine

## 2013-06-30 MED ORDER — LOSARTAN POTASSIUM 50 MG PO TABS: ORAL_TABLET | ORAL | Status: DC

## 2013-06-30 NOTE — Telephone Encounter (Signed)
Refilled med and pt will NTBS before further refills - will send letter.

## 2013-06-30 NOTE — Telephone Encounter (Signed)
Pt is needing a refill on her Losartan 50 Mg she is wanting a 90 day supply Pharmacy is [facility]  Call back number is [phone]

## 2013-07-09 ENCOUNTER — Other Ambulatory Visit: Payer: Medicare Other

## 2013-07-09 DIAGNOSIS — E782 Mixed hyperlipidemia: Secondary | ICD-10-CM | POA: Diagnosis not present

## 2013-07-09 DIAGNOSIS — I10 Essential (primary) hypertension: Secondary | ICD-10-CM

## 2013-07-09 DIAGNOSIS — Z79899 Other long term (current) drug therapy: Secondary | ICD-10-CM | POA: Diagnosis not present

## 2013-07-09 LAB — COMPREHENSIVE METABOLIC PANEL
ALT: 12 U/L (ref 0–35)
AST: 17 U/L (ref 0–37)
Albumin: 3.9 g/dL (ref 3.5–5.2)
Alkaline Phosphatase: 76 U/L (ref 39–117)
BUN: 21 mg/dL (ref 6–23)
CO2: 25 mEq/L (ref 19–32)
Calcium: 9 mg/dL (ref 8.4–10.5)
Chloride: 105 mEq/L (ref 96–112)
Creat: 0.9 mg/dL (ref 0.50–1.10)
Glucose, Bld: 99 mg/dL (ref 70–99)
Potassium: 4.4 mEq/L (ref 3.5–5.3)
Sodium: 139 mEq/L (ref 135–145)
Total Bilirubin: 0.6 mg/dL (ref 0.2–1.2)
Total Protein: 6.4 g/dL (ref 6.0–8.3)

## 2013-07-09 LAB — CBC WITH DIFFERENTIAL/PLATELET
Basophils Absolute: 0 10*3/uL (ref 0.0–0.1)
Basophils Relative: 0 % (ref 0–1)
Eosinophils Absolute: 0 10*3/uL (ref 0.0–0.7)
Eosinophils Relative: 1 % (ref 0–5)
HCT: 38.3 % (ref 36.0–46.0)
Hemoglobin: 13 g/dL (ref 12.0–15.0)
Lymphocytes Relative: 37 % (ref 12–46)
Lymphs Abs: 2.4 10*3/uL (ref 0.7–4.0)
MCH: 31.7 pg (ref 26.0–34.0)
MCHC: 33.9 g/dL (ref 30.0–36.0)
MCV: 93.4 fL (ref 78.0–100.0)
Monocytes Absolute: 0.4 10*3/uL (ref 0.1–1.0)
Monocytes Relative: 6 % (ref 3–12)
Neutro Abs: 3.5 10*3/uL (ref 1.7–7.7)
Neutrophils Relative %: 56 % (ref 43–77)
Platelets: 259 10*3/uL (ref 150–400)
RBC: 4.1 MIL/uL (ref 3.87–5.11)
RDW: 14.1 % (ref 11.5–15.5)
WBC: 6.3 10*3/uL (ref 4.0–10.5)

## 2013-07-09 LAB — LIPID PANEL
Cholesterol: 141 mg/dL (ref 0–200)
HDL: 51 mg/dL (ref >39)
LDL Cholesterol: 72 mg/dL (ref 0–99)
Total CHOL/HDL Ratio: 2.8 Ratio
Triglycerides: 90 mg/dL (ref <150)
VLDL: 18 mg/dL (ref 0–40)

## 2013-07-14 ENCOUNTER — Ambulatory Visit (INDEPENDENT_AMBULATORY_CARE_PROVIDER_SITE_OTHER): Payer: Medicare Other | Admitting: Family Medicine

## 2013-07-14 ENCOUNTER — Encounter: Payer: Self-pay | Admitting: Family Medicine

## 2013-07-14 ENCOUNTER — Other Ambulatory Visit: Payer: Self-pay | Admitting: Family Medicine

## 2013-07-14 VITALS — BP 110/66 | HR 76 | Temp 97.3°F | Resp 18 | Ht 61.0 in | Wt 167.0 lb

## 2013-07-14 DIAGNOSIS — Z0000 Encounter for general adult medical examination without abnormal findings: Secondary | ICD-10-CM | POA: Diagnosis not present

## 2013-07-14 DIAGNOSIS — Z23 Encounter for immunization: Secondary | ICD-10-CM | POA: Diagnosis not present

## 2013-07-14 MED ORDER — LOSARTAN POTASSIUM 50 MG PO TABS: ORAL_TABLET | ORAL | Status: DC

## 2013-07-14 NOTE — Addendum Note (Signed)
Addended by: Shary Decamp B on: 07/14/2013 03:54 PM   Modules accepted: Orders

## 2013-07-14 NOTE — Progress Notes (Signed)
Subjective:    Patient ID: Brittany Kirk, female    DOB: 01-24-1946, 68 y.o.   MRN: [id number]  HPI Patient seen today for complete physical exam. She has had her flu shot. She is due for Zostavax as well as Pneumovax 23. Defer Prevnar 13 until next year. Her last bone density test was 3 years ago. Her mammograms performed last year. Perhaps her was performed last year. All these are up to date. Her colonoscopy is also up-to-date. Her most recent lab work is listed below: Lab on 07/09/2013  Component Date Value Range Status  . WBC 07/09/2013 6.3  4.0 - 10.5 K/uL Final  . RBC 07/09/2013 4.10  3.87 - 5.11 MIL/uL Final  . Hemoglobin 07/09/2013 13.0  12.0 - 15.0 g/dL Final  . HCT 07/09/2013 38.3  36.0 - 46.0 % Final  . MCV 07/09/2013 93.4  78.0 - 100.0 fL Final  . MCH 07/09/2013 31.7  26.0 - 34.0 pg Final  . MCHC 07/09/2013 33.9  30.0 - 36.0 g/dL Final  . RDW 07/09/2013 14.1  11.5 - 15.5 % Final  . Platelets 07/09/2013 259  150 - 400 K/uL Final  . Neutrophils Relative % 07/09/2013 56  43 - 77 % Final  . Neutro Abs 07/09/2013 3.5  1.7 - 7.7 K/uL Final  . Lymphocytes Relative 07/09/2013 37  12 - 46 % Final  . Lymphs Abs 07/09/2013 2.4  0.7 - 4.0 K/uL Final  . Monocytes Relative 07/09/2013 6  3 - 12 % Final  . Monocytes Absolute 07/09/2013 0.4  0.1 - 1.0 K/uL Final  . Eosinophils Relative 07/09/2013 1  0 - 5 % Final  . Eosinophils Absolute 07/09/2013 0.0  0.0 - 0.7 K/uL Final  . Basophils Relative 07/09/2013 0  0 - 1 % Final  . Basophils Absolute 07/09/2013 0.0  0.0 - 0.1 K/uL Final  . Smear Review 07/09/2013 Criteria for review not met   Final  . Sodium 07/09/2013 139  135 - 145 mEq/L Final  . Potassium 07/09/2013 4.4  3.5 - 5.3 mEq/L Final  . Chloride 07/09/2013 105  96 - 112 mEq/L Final  . CO2 07/09/2013 25  19 - 32 mEq/L Final  . Glucose, Bld 07/09/2013 99  70 - 99 mg/dL Final  . BUN 07/09/2013 21  6 - 23 mg/dL Final  . Creat 07/09/2013 0.90  0.50 - 1.10 mg/dL Final  . Total  Bilirubin 07/09/2013 0.6  0.2 - 1.2 mg/dL Final   ** Please note change in reference range(s). **  . Alkaline Phosphatase 07/09/2013 76  39 - 117 U/L Final  . AST 07/09/2013 17  0 - 37 U/L Final  . ALT 07/09/2013 12  0 - 35 U/L Final  . Total Protein 07/09/2013 6.4  6.0 - 8.3 g/dL Final  . Albumin 07/09/2013 3.9  3.5 - 5.2 g/dL Final  . Calcium 07/09/2013 9.0  8.4 - 10.5 mg/dL Final  . Cholesterol 07/09/2013 141  0 - 200 mg/dL Final   Comment: ATP III Classification:                                < 200        mg/dL        Desirable                               200 -  239     mg/dL        Borderline High                               >= 240        mg/dL        High                             . Triglycerides 07/09/2013 90  <150 mg/dL Final  . HDL 07/09/2013 51  >39 mg/dL Final  . Total CHOL/HDL Ratio 07/09/2013 2.8   Final  . VLDL 07/09/2013 18  0 - 40 mg/dL Final  . LDL Cholesterol 07/09/2013 72  0 - 99 mg/dL Final   Comment:                            Total Cholesterol/HDL Ratio:CHD Risk                                                 Coronary Heart Disease Risk Table                                                                 Men       Women                                   1/2 Average Risk              3.4        3.3                                       Average Risk              5.0        4.4                                    2X Average Risk              9.6        7.1                                    3X Average Risk             23.4       11.0                          Use the calculated Patient Ratio above and the CHD Risk table  to determine the patient's CHD Risk.                          ATP III Classification (LDL):                                < 100        mg/dL         Optimal                               100 - 129     mg/dL         Near or Above Optimal                               130 - 159     mg/dL         Borderline High                                160 - 189     mg/dL         High                                > 190        mg/dL         Very High                              Past Medical History  Diagnosis Date  . Hyperlipidemia   . Hypertension   . Depression   . Tobacco user   . History of skin cancer   . Allergic rhinitis   . Cancer   . CAD (coronary artery disease)   . Emphysema     no longer uses inhalers  . Shortness of breath     sob if walks up flight of stairs  . Gallstones     nausea, pain upper right abdomen  . H/O hiatal hernia   . GERD (gastroesophageal reflux disease)   . Complication of anesthesia     sore throat after a surgery  . Asymptomatic bilateral carotid artery stenosis    Past Surgical History  Procedure Laterality Date  . Tubal ligation    . Tonsilectomy, adenoidectomy, bilateral myringotomy and tubes    . Cholecystectomy  07/08/2012    Procedure: LAPAROSCOPIC CHOLECYSTECTOMY WITH INTRAOPERATIVE CHOLANGIOGRAM;  Surgeon: Gayland Curry, MD,FACS;  Location: [facility];  Service: General;  Laterality: N/A;  Laparoscopic Cholecystectomy with Intraoperative Cholangiogram   Current Outpatient Prescriptions on File Prior to Visit  Medication Sig Dispense Refill  . atorvastatin (LIPITOR) 20 MG tablet Take 1 tablet (20 mg total) by mouth daily.  90 tablet  1  . Calcium-Vitamin D-Vitamin K (CALCIUM SOFT CHEWS PO) Take 1 each by mouth daily. Takes 1-2 daily when remembers      . Cholecalciferol (VITAMIN D3) 5000 UNITS CAPS Take 5,000 Units by mouth daily.      . cyanocobalamin 2000 MCG tablet Take 2,000 mcg by mouth daily. B12 slow release tablet daily      . escitalopram (LEXAPRO) 20 MG tablet Take 20  mg by mouth daily.      Marland Kitchen VITAMIN E PO Take 3,000 mcg by mouth daily.       No current facility-administered medications on file prior to visit.   Allergies  Allergen Reactions  . Cefaclor Itching, Swelling and Rash  . Metronidazole Itching, Swelling and Rash  . Naproxen Itching,  Swelling and Rash  . Nsaids Itching, Swelling and Rash  . Penicillins Itching, Swelling and Rash  . Septra [Bactrim] Itching, Swelling and Rash  . Sulfonamide Derivatives Itching, Swelling and Rash   History   Social History  . Marital Status: Married    Spouse Name: N/A    Number of Children: N/A  . Years of Education: N/A   Occupational History  . 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 Smoking status: Former Smoker -- 0.50 packs/day for 40 years    Types: Cigarettes  . Smokeless tobacco: Never Used  . Alcohol Use: No  . Drug Use: No  . Sexual Activity: Not on file   Other Topics Concern  . Not on file   Social History Narrative  . No narrative on file      Review of Systems  All other systems reviewed and are negative.       Objective:   Physical Exam  Vitals reviewed. Constitutional: She is oriented to person, place, and time. She appears well-developed and well-nourished. No distress.  HENT:  Head: Normocephalic and atraumatic.  Right Ear: External ear normal.  Left Ear: External ear normal.  Nose: Nose normal.  Mouth/Throat: Oropharynx is clear and moist. No oropharyngeal exudate.  Eyes: Conjunctivae and EOM are normal. Pupils are equal, round, and reactive to light. Right eye exhibits no discharge. Left eye exhibits no discharge. No scleral icterus.  Neck: Normal range of motion. Neck supple. No JVD present. No tracheal deviation present. No thyromegaly present.  Cardiovascular: Normal rate, regular rhythm, normal heart sounds and intact distal pulses.  Exam reveals no gallop and no friction rub.   No murmur heard. Pulmonary/Chest: Effort normal and breath sounds normal. No stridor. No respiratory distress. She has no wheezes. She has no rales. She exhibits no tenderness.  Abdominal: Soft. Bowel sounds are normal. She exhibits no distension and no mass. There is no tenderness. There is no rebound and no guarding.  Musculoskeletal:  Normal range of motion. She exhibits no edema and no tenderness.  Lymphadenopathy:    She has no cervical adenopathy.  Neurological: She is alert and oriented to person, place, and time. She displays normal reflexes. No cranial nerve deficit. She exhibits normal muscle tone. Coordination normal.  Skin: Skin is warm. No rash noted. She is not diaphoretic. No erythema. No pallor.  Psychiatric: She has a normal mood and affect. Her behavior is normal. Judgment and thought content normal.          Assessment & Plan:  1. Routine general medical examination at a health care facility Patient received Pneumovax 23 today in the office. I gave her a prescription for Zostavax and she'll get that her local pharmacy. I will give the patient Prevnar 13 next year. Her lab work is excellent. We will temporarily discontinue Lipitor and recheck a CMP and a fasting lipid panel in 3 months. Her goal LDL is less than 130. If she remains below goal off the Lipitor we can discontinue the medication permanently. The remainder of her preventive care is up to date including her immunizations and cancer screenings.

## 2013-08-24 ENCOUNTER — Telehealth: Payer: Self-pay | Admitting: Family Medicine

## 2013-08-24 NOTE — Telephone Encounter (Signed)
Call back number is [phone] Pt thinks she has a sinus infection and she is wanting to know if she could have something called in. She had fever of 99.2 this morning and she took something for that, and she has blew out some thick yellow mucus

## 2013-08-25 NOTE — Telephone Encounter (Signed)
Pt went to [facility] and was told to take Allegra to help dry up her sinuses but nothing else was given. Informed pt of other medications for different symptoms as per [facility]'s note and pt will f/u if she gets worse.

## 2013-08-25 NOTE — Telephone Encounter (Signed)
Recommend tincture of time, 90% of sinus infections are viral and take 7 days to resolve with or without abx.  Unless symptoms worsen, I would treat with tylenol and motrin for fever and pain, nasal saline qid, and coricedan for congestion.  Notify me if syptoms have been present for more than 7 days, phone note gives no duration.  Also, patient needs to notify me if symptoms worsen.

## 2013-08-27 DIAGNOSIS — R0982 Postnasal drip: Secondary | ICD-10-CM | POA: Diagnosis not present

## 2013-08-27 DIAGNOSIS — J019 Acute sinusitis, unspecified: Secondary | ICD-10-CM | POA: Diagnosis not present

## 2013-12-07 ENCOUNTER — Ambulatory Visit (INDEPENDENT_AMBULATORY_CARE_PROVIDER_SITE_OTHER): Payer: Medicare Other | Admitting: Physician Assistant

## 2013-12-07 ENCOUNTER — Encounter: Payer: Self-pay | Admitting: Physician Assistant

## 2013-12-07 VITALS — BP 134/70 | HR 60 | Temp 98.4°F | Resp 18 | Wt 166.0 lb

## 2013-12-07 DIAGNOSIS — J0100 Acute maxillary sinusitis, unspecified: Secondary | ICD-10-CM | POA: Diagnosis not present

## 2013-12-07 DIAGNOSIS — B9689 Other specified bacterial agents as the cause of diseases classified elsewhere: Secondary | ICD-10-CM | POA: Diagnosis not present

## 2013-12-07 DIAGNOSIS — A499 Bacterial infection, unspecified: Secondary | ICD-10-CM | POA: Diagnosis not present

## 2013-12-07 DIAGNOSIS — J988 Other specified respiratory disorders: Secondary | ICD-10-CM

## 2013-12-07 MED ORDER — AZITHROMYCIN 250 MG PO TABS: ORAL_TABLET | ORAL | Status: DC

## 2013-12-07 NOTE — Progress Notes (Signed)
Patient ID: FRUMA AFRICA MRN: [id number], DOB: 1945/12/22, 68 y.o. Date of Encounter: 12/07/2013, 5:13 PM    Chief Complaint:  Chief Complaint  Patient presents with  . c/o sinus inf x 1 week    pink nasal drainage, facial pain, now going into chest, green secretions     HPI: 68 y.o. year old white female says that she's been sick for over a week. Has been blowing thick yellow-green mucus from her nose. Has sinus pain especially in the right cheek and also has had some earache in the right ear. Just today started to develop some cough and is concerned that it is "moving into her chest". Has had no known fevers or chills.     Home Meds:   Outpatient Prescriptions Prior to Visit  Medication Sig Dispense Refill  . atorvastatin (LIPITOR) 20 MG tablet Take 1 tablet (20 mg total) by mouth daily.  90 tablet  1  . Calcium-Vitamin D-Vitamin K (CALCIUM SOFT CHEWS PO) Take 1 each by mouth daily. Takes 1-2 daily when remembers      . Cholecalciferol (VITAMIN D3) 5000 UNITS CAPS Take 5,000 Units by mouth daily.      . Coenzyme Q10 (CO Q 10) 100 MG CAPS Take by mouth.      . cyanocobalamin 2000 MCG tablet Take 2,000 mcg by mouth daily. B12 slow release tablet daily      . escitalopram (LEXAPRO) 20 MG tablet Take 20 mg by mouth daily.      Marland Kitchen losartan (COZAAR) 50 MG tablet TAKE 1 TABLET (50 MG TOTAL) BY MOUTH DAILY.  90 tablet  3  . VITAMIN E PO Take 3,000 mcg by mouth daily.      Marland Kitchen losartan (COZAAR) 50 MG tablet TAKE 1 TABLET (50 MG TOTAL) BY MOUTH DAILY.  30 tablet  11   No facility-administered medications prior to visit.    Allergies:  Allergies  Allergen Reactions  . Cefaclor Itching, Swelling and Rash  . Metronidazole Itching, Swelling and Rash  . Naproxen Itching, Swelling and Rash  . Nsaids Itching, Swelling and Rash  . Penicillins Itching, Swelling and Rash  . Septra [Bactrim] Itching, Swelling and Rash  . Sulfonamide Derivatives Itching, Swelling and Rash      Review of  Systems: See HPI for pertinent ROS. All other ROS negative.    Physical Exam: Blood pressure 134/70, pulse 60, temperature 98.4 F (36.9 C), temperature source Oral, resp. rate 18, weight 166 lb (75.297 kg)., Body mass index is 31.38 kg/(m^2). General: WNWD WF.  Appears in no acute distress. HEENT: Normocephalic, atraumatic, eyes without discharge, sclera non-icteric, nares are without discharge. Bilateral auditory canals clear. Right TM dull compared to Left TM. Oral cavity moist, posterior pharynx without exudate, erythema, peritonsillar abscess. Significant tenderness with percussion of right maxillary sinus. Minimal tenderness with percussion of left maxillary sinus.  Neck: Supple. No thyromegaly. No lymphadenopathy. Lungs: Clear bilaterally to auscultation without wheezes, rales, or rhonchi. Breathing is unlabored. Heart: Regular rhythm. No murmurs, rubs, or gallops. Msk:  Strength and tone normal for age. Extremities/Skin: Warm and dry. Neuro: Alert and oriented X 3. Moves all extremities spontaneously. Gait is normal. CNII-XII grossly in tact. Psych:  Responds to questions appropriately with a normal affect.     ASSESSMENT AND PLAN:  68 y.o. year old female with  1. Acute maxillary sinusitis, recurrence not specified - azithromycin (ZITHROMAX) 250 MG tablet; Day 1: Take 2 daily.  Days 2-5: Take 1 daily.  Dispense: 6 tablet; Refill: 0  2. Bacterial respiratory infection - azithromycin (ZITHROMAX) 250 MG tablet; Day 1: Take 2 daily.  Days 2-5: Take 1 daily.  Dispense: 6 tablet; Refill: 0  She is allergic to multiple antibiotics including penicillin, sulfa, Cefaclor. Says that she cannot take amoxicillin or Augmentin. Says she does tolerate azithromycin well and this usually works well for her. She is to complete the azithromycin as directed. Followup if symptoms do not resolve within one week after completion of antibiotics. May continue over-the-counter indications as needed for  decongestant and cough suppressant.  [address] [place], [place], [facility] 12/07/2013 5:13 PM

## 2014-01-07 DIAGNOSIS — Z01419 Encounter for gynecological examination (general) (routine) without abnormal findings: Secondary | ICD-10-CM | POA: Diagnosis not present

## 2014-01-07 DIAGNOSIS — Z1231 Encounter for screening mammogram for malignant neoplasm of breast: Secondary | ICD-10-CM | POA: Diagnosis not present

## 2014-01-07 DIAGNOSIS — N760 Acute vaginitis: Secondary | ICD-10-CM | POA: Diagnosis not present

## 2014-01-14 ENCOUNTER — Other Ambulatory Visit: Payer: Self-pay | Admitting: Obstetrics and Gynecology

## 2014-01-14 DIAGNOSIS — R928 Other abnormal and inconclusive findings on diagnostic imaging of breast: Secondary | ICD-10-CM

## 2014-01-19 ENCOUNTER — Ambulatory Visit
Admission: RE | Admit: 2014-01-19 | Discharge: 2014-01-19 | Disposition: A | Discharge status: Home / Self Care | Payer: Medicare Other | Source: Ambulatory Visit | Attending: Obstetrics and Gynecology | Admitting: Obstetrics and Gynecology

## 2014-01-19 ENCOUNTER — Other Ambulatory Visit: Payer: Self-pay | Admitting: Obstetrics and Gynecology

## 2014-01-19 DIAGNOSIS — R928 Other abnormal and inconclusive findings on diagnostic imaging of breast: Secondary | ICD-10-CM

## 2014-01-19 DIAGNOSIS — N6459 Other signs and symptoms in breast: Secondary | ICD-10-CM | POA: Diagnosis not present

## 2014-01-25 ENCOUNTER — Other Ambulatory Visit: Payer: Self-pay | Admitting: Obstetrics and Gynecology

## 2014-01-25 DIAGNOSIS — R928 Other abnormal and inconclusive findings on diagnostic imaging of breast: Secondary | ICD-10-CM

## 2014-01-26 ENCOUNTER — Ambulatory Visit
Admission: RE | Admit: 2014-01-26 | Discharge: 2014-01-26 | Disposition: A | Discharge status: Home / Self Care | Payer: Medicare Other | Source: Ambulatory Visit | Attending: Obstetrics and Gynecology | Admitting: Obstetrics and Gynecology

## 2014-01-26 DIAGNOSIS — R928 Other abnormal and inconclusive findings on diagnostic imaging of breast: Secondary | ICD-10-CM

## 2014-01-26 DIAGNOSIS — N63 Unspecified lump in unspecified breast: Secondary | ICD-10-CM | POA: Diagnosis not present

## 2014-01-26 DIAGNOSIS — C50919 Malignant neoplasm of unspecified site of unspecified female breast: Secondary | ICD-10-CM | POA: Diagnosis not present

## 2014-02-03 ENCOUNTER — Encounter (INDEPENDENT_AMBULATORY_CARE_PROVIDER_SITE_OTHER): Payer: Self-pay | Admitting: General Surgery

## 2014-02-03 ENCOUNTER — Ambulatory Visit (INDEPENDENT_AMBULATORY_CARE_PROVIDER_SITE_OTHER): Payer: Medicare Other | Admitting: General Surgery

## 2014-02-03 VITALS — BP 156/80 | HR 67 | Temp 98.1°F | Ht 61.0 in | Wt 167.0 lb

## 2014-02-03 DIAGNOSIS — C50919 Malignant neoplasm of unspecified site of unspecified female breast: Secondary | ICD-10-CM

## 2014-02-03 DIAGNOSIS — C50911 Malignant neoplasm of unspecified site of right female breast: Secondary | ICD-10-CM

## 2014-02-03 NOTE — Patient Instructions (Addendum)
You have invasive right breast cancer.  The planned procedure if radioactive seed localized (RSL) right breast lumpectomy and right axillary sentinel lymph node biopsy.  The office will call you to schedule this.    [place] Office Phone Number [phone]  BREAST BIOPSY/ PARTIAL MASTECTOMY: POST OP INSTRUCTIONS  Always review your discharge instruction sheet given to you by the facility where your surgery was performed.  IF YOU HAVE DISABILITY OR FAMILY LEAVE FORMS, YOU MUST BRING THEM TO THE OFFICE FOR PROCESSING.  DO NOT GIVE THEM TO YOUR DOCTOR.  1. A prescription for pain medication may be given to you upon discharge.  Take your pain medication as prescribed, if needed.  If narcotic pain medicine is not needed, then you may take acetaminophen (Tylenol) or ibuprofen (Advil) as needed. 2. Take your usually prescribed medications unless otherwise directed 3. If you need a refill on your pain medication, please contact your pharmacy.  They will contact our office to request authorization.  Prescriptions will not be filled after 5pm or on week-ends. 4. You should eat very light the first 24 hours after surgery, such as soup, crackers, pudding, etc.  Resume your normal diet the day after surgery. 5. Most patients will experience some swelling and bruising in the breast.  Ice packs and a good support bra will help.  Swelling and bruising can take several days to resolve.  6. It is common to experience some constipation if taking pain medication after surgery.  Increasing fluid intake and taking a stool softener will usually help or prevent this problem from occurring.  A mild laxative (Milk of Magnesia or Miralax) should be taken according to package directions if there are no bowel movements after 48 hours. 7. Unless discharge instructions indicate otherwise, you may remove your bandages 24-48 hours after surgery, and you may shower at that time.  You may have steri-strips  (small skin tapes) in place directly over the incision.  These strips should be left on the skin for 7-10 days.  If your surgeon used skin glue on the incision, you may shower in 24 hours.  The glue will flake off over the next 2-3 weeks.  Any sutures or staples will be removed at the office during your follow-up visit. 8. ACTIVITIES:  You may resume regular daily activities (gradually increasing) beginning the next day.  Wearing a good support bra or sports bra minimizes pain and swelling.  You may have sexual intercourse when it is comfortable. a. You may drive when you no longer are taking prescription pain medication, you can comfortably wear a seatbelt, and you can safely maneuver your car and apply brakes. b. RETURN TO WORK:  ______________________________________________________________________________________ 9. You should see your doctor in the office for a follow-up appointment approximately two weeks after your surgery.  Your doctor's nurse will typically make your follow-up appointment when she calls you with your pathology report.  Expect your pathology report 2-3 business days after your surgery.  You may call to check if you do not hear from [place] after three days. 10. OTHER INSTRUCTIONS: _______________________________________________________________________________________________ _____________________________________________________________________________________________________________________________________ _____________________________________________________________________________________________________________________________________ _____________________________________________________________________________________________________________________________________  WHEN TO CALL YOUR DOCTOR: 1. Fever over 101.0 2. Nausea and/or vomiting. 3. Extreme swelling or bruising. 4. Continued bleeding from incision. 5. Increased pain, redness, or drainage from the incision.  The clinic  staff is available to answer your questions during regular business hours.  Please don't hesitate to call and ask to speak to one of the nurses for clinical concerns.  If you have a medical emergency, go to the nearest emergency room or call 911.  A surgeon from [facility] is always on call at the hospital.  For further questions, please visit [email or web address]

## 2014-02-03 NOTE — Progress Notes (Signed)
Patient ID: Brittany Kirk, female   DOB: [birth year], 68 y.o.   MRN: [id number]  Chief Complaint  Patient presents with  . New Evaluation    Breast Cancer    HPI Brittany Kirk is a 68 y.o. female.   HPI  She is referred by Dr. Joneen Caraway because of a new diagnosis of invasive right breast cancer. She had a screening detected abnormality on her mammogram on the right side at the 10:00 position. Image guided biopsy demonstrated invasive ductal carcinoma.  The lesion was 7.5 mm in size. No enlarged right axillary lymph nodes were noted. The cancer is ER/PR positive, HER-2/neu negative, proliferation rate is 12%.  She has a first cousin with breast cancer. Her situation was discussed at the multidisciplinary breast cancer, this morning and she is felt to be a good candidate for breast conservation therapy. It was also felt that an  Oncotype should be sent.  Past Medical History  Diagnosis Date  . Hyperlipidemia   . Hypertension   . Depression   . Tobacco user   . History of skin cancer   . Allergic rhinitis   . Cancer   . CAD (coronary artery disease)   . Emphysema     no longer uses inhalers  . Shortness of breath     sob if walks up flight of stairs  . Gallstones     nausea, pain upper right abdomen  . H/O hiatal hernia   . GERD (gastroesophageal reflux disease)   . Complication of anesthesia     sore throat after a surgery  . Asymptomatic bilateral carotid artery stenosis     Past Surgical History  Procedure Laterality Date  . Tubal ligation    . Tonsilectomy, adenoidectomy, bilateral myringotomy and tubes    . Cholecystectomy  07/08/2012    Procedure: LAPAROSCOPIC CHOLECYSTECTOMY WITH INTRAOPERATIVE CHOLANGIOGRAM;  Surgeon: Gayland Curry, MD,FACS;  Location: [facility];  Service: General;  Laterality: N/A;  Laparoscopic Cholecystectomy with Intraoperative Cholangiogram    Family History  Problem Relation Age of Onset  . Heart disease      5 brothers and 2 sister  . Arthritis  Mother   . Diverticulitis Sister     Social History History  Substance Use Topics  . Smoking status: Former Smoker -- 0.50 packs/day for 40 years    Types: Cigarettes  . Smokeless tobacco: Never Used  . Alcohol Use: No    Allergies  Allergen Reactions  . Latex   . Cefaclor Itching, Swelling and Rash  . Metronidazole Itching, Swelling and Rash  . Naproxen Itching, Swelling and Rash  . Nsaids Itching, Swelling and Rash  . Penicillins Itching, Swelling and Rash  . Septra [Bactrim] Itching, Swelling and Rash  . Sulfonamide Derivatives Itching, Swelling and Rash    Current Outpatient Prescriptions  Medication Sig Dispense Refill  . aspirin 81 MG tablet Take 81 mg by mouth daily.      . Calcium-Vitamin D-Vitamin K (CALCIUM SOFT CHEWS PO) Take 1 each by mouth daily. Takes 1-2 daily when remembers      . Cholecalciferol (VITAMIN D3) 5000 UNITS CAPS Take 5,000 Units by mouth daily.      . Coenzyme Q10 (CO Q 10) 100 MG CAPS Take by mouth.      . cyanocobalamin 2000 MCG tablet Take 2,000 mcg by mouth daily. B12 slow release tablet daily      . escitalopram (LEXAPRO) 20 MG tablet Take 20 mg by mouth daily.      Marland Kitchen  losartan (COZAAR) 50 MG tablet TAKE 1 TABLET (50 MG TOTAL) BY MOUTH DAILY.  90 tablet  3  . Multiple Vitamin (MULTIVITAMIN) tablet Take 1 tablet by mouth daily.      Marland Kitchen atorvastatin (LIPITOR) 20 MG tablet Take 1 tablet (20 mg total) by mouth daily.  90 tablet  1  . azithromycin (ZITHROMAX) 250 MG tablet Day 1: Take 2 daily.  Days 2-5: Take 1 daily.  6 tablet  0  . VITAMIN E PO Take 3,000 mcg by mouth daily.       No current facility-administered medications for this visit.    Review of Systems Review of Systems  Constitutional: Negative.   HENT: Negative.   Respiratory: Negative.   Cardiovascular: Negative.   Gastrointestinal: Negative.   Genitourinary: Negative.   Neurological: Negative.   Hematological: Bruises/bleeds easily.    Blood pressure 156/80, pulse 67,  temperature 98.1 F (36.7 C), temperature source Oral, height _0  (1.549 m), weight 167 lb (75.751 kg).  Physical Exam Physical Exam  Constitutional: She appears well-developed and well-nourished. No distress.  HENT:  Head: Normocephalic and atraumatic.  Eyes: EOM are normal. No scleral icterus.  Cardiovascular: Normal rate and regular rhythm.   Pulmonary/Chest: Effort normal and breath sounds normal.  Right breast demonstrates an ecchymosis in the upper-outer quadrant but no dominant masses. Left breast demonstrates no dominant masses or suspicious skin changes.  Abdominal: Soft. She exhibits no mass. There is no tenderness.  Musculoskeletal:  No axillary or supraclavicular adenopathy.  Deep into rubor is noted in the feet  Lymphadenopathy:    She has no cervical adenopathy.  Neurological: She is alert.  Skin: Skin is warm and dry.  Psychiatric: She has a normal mood and affect. Her behavior is normal.    Data Reviewed Mammogram, ultrasound, pathology report.  Assessment    Invasive right breast cancer. She is a good candidate for breast conservation therapy. We discussed this versus mastectomy. We discussed the possible need for radiation and endocrine therapy postoperatively. She was light to proceed with breast conservation therapy.     Plan    Right breast partial mastectomy after radioactive seed localization, right axilla sentinel lymph node biopsy.I have explained the procedure, risks, and aftercare.  The risks include but are not limited to bleeding, infection, wound problems, seroma formation, anesthesia, nerve injury, lymphedema, need for reexcision or removal of more lymph nodes at a later time.  She seems to understand and agrees with the plan.        Lucyle Alumbaugh J 02/03/2014, 12:43 PM

## 2014-02-10 ENCOUNTER — Other Ambulatory Visit (INDEPENDENT_AMBULATORY_CARE_PROVIDER_SITE_OTHER): Payer: Self-pay | Admitting: General Surgery

## 2014-02-10 DIAGNOSIS — C50911 Malignant neoplasm of unspecified site of right female breast: Secondary | ICD-10-CM

## 2014-02-12 ENCOUNTER — Encounter (HOSPITAL_BASED_OUTPATIENT_CLINIC_OR_DEPARTMENT_OTHER): Payer: Self-pay | Admitting: *Deleted

## 2014-02-12 NOTE — Progress Notes (Signed)
To come in for labs after seeds done-

## 2014-02-18 ENCOUNTER — Ambulatory Visit
Admission: RE | Admit: 2014-02-18 | Discharge: 2014-02-18 | Disposition: A | Discharge status: Home / Self Care | Payer: Medicare Other | Source: Ambulatory Visit | Attending: General Surgery | Admitting: General Surgery

## 2014-02-18 ENCOUNTER — Encounter (HOSPITAL_BASED_OUTPATIENT_CLINIC_OR_DEPARTMENT_OTHER)
Admission: RE | Admit: 2014-02-18 | Discharge: 2014-02-18 | Disposition: A | Discharge status: Home / Self Care | Payer: Medicare Other | Source: Ambulatory Visit | Attending: General Surgery | Admitting: General Surgery

## 2014-02-18 DIAGNOSIS — J438 Other emphysema: Secondary | ICD-10-CM | POA: Diagnosis not present

## 2014-02-18 DIAGNOSIS — Z882 Allergy status to sulfonamides status: Secondary | ICD-10-CM | POA: Diagnosis not present

## 2014-02-18 DIAGNOSIS — Z9010 Acquired absence of unspecified breast and nipple: Secondary | ICD-10-CM | POA: Diagnosis not present

## 2014-02-18 DIAGNOSIS — Z888 Allergy status to other drugs, medicaments and biological substances status: Secondary | ICD-10-CM | POA: Diagnosis not present

## 2014-02-18 DIAGNOSIS — Z886 Allergy status to analgesic agent status: Secondary | ICD-10-CM | POA: Diagnosis not present

## 2014-02-18 DIAGNOSIS — I2510 Atherosclerotic heart disease of native coronary artery without angina pectoris: Secondary | ICD-10-CM | POA: Diagnosis not present

## 2014-02-18 DIAGNOSIS — F3289 Other specified depressive episodes: Secondary | ICD-10-CM | POA: Diagnosis not present

## 2014-02-18 DIAGNOSIS — Z91040 Latex allergy status: Secondary | ICD-10-CM | POA: Diagnosis not present

## 2014-02-18 DIAGNOSIS — C50911 Malignant neoplasm of unspecified site of right female breast: Secondary | ICD-10-CM

## 2014-02-18 DIAGNOSIS — Z880 Allergy status to penicillin: Secondary | ICD-10-CM | POA: Diagnosis not present

## 2014-02-18 DIAGNOSIS — F329 Major depressive disorder, single episode, unspecified: Secondary | ICD-10-CM | POA: Diagnosis not present

## 2014-02-18 DIAGNOSIS — C50919 Malignant neoplasm of unspecified site of unspecified female breast: Secondary | ICD-10-CM | POA: Diagnosis not present

## 2014-02-18 DIAGNOSIS — I10 Essential (primary) hypertension: Secondary | ICD-10-CM | POA: Diagnosis not present

## 2014-02-18 DIAGNOSIS — Z7982 Long term (current) use of aspirin: Secondary | ICD-10-CM | POA: Diagnosis not present

## 2014-02-18 DIAGNOSIS — Z87891 Personal history of nicotine dependence: Secondary | ICD-10-CM | POA: Diagnosis not present

## 2014-02-18 DIAGNOSIS — Z79899 Other long term (current) drug therapy: Secondary | ICD-10-CM | POA: Diagnosis not present

## 2014-02-18 DIAGNOSIS — Z01818 Encounter for other preprocedural examination: Secondary | ICD-10-CM | POA: Diagnosis not present

## 2014-02-18 DIAGNOSIS — E785 Hyperlipidemia, unspecified: Secondary | ICD-10-CM | POA: Diagnosis not present

## 2014-02-18 LAB — COMPREHENSIVE METABOLIC PANEL
ALT: 10 U/L (ref 0–35)
AST: 15 U/L (ref 0–37)
Albumin: 3.3 g/dL — ABNORMAL LOW (ref 3.5–5.2)
Alkaline Phosphatase: 73 U/L (ref 39–117)
Anion gap: 12 (ref 5–15)
BUN: 21 mg/dL (ref 6–23)
CO2: 25 mEq/L (ref 19–32)
Calcium: 8.9 mg/dL (ref 8.4–10.5)
Chloride: 106 mEq/L (ref 96–112)
Creatinine, Ser: 0.98 mg/dL (ref 0.50–1.10)
GFR calc Af Amer: 66 mL/min — ABNORMAL LOW (ref >90)
GFR calc non Af Amer: 57 mL/min — ABNORMAL LOW (ref >90)
Glucose, Bld: 79 mg/dL (ref 70–99)
Potassium: 4.5 mEq/L (ref 3.7–5.3)
Sodium: 143 mEq/L (ref 137–147)
Total Bilirubin: 0.4 mg/dL (ref 0.3–1.2)
Total Protein: 6.6 g/dL (ref 6.0–8.3)

## 2014-02-18 LAB — CBC WITH DIFFERENTIAL/PLATELET
Basophils Absolute: 0 10*3/uL (ref 0.0–0.1)
Basophils Relative: 0 % (ref 0–1)
Eosinophils Absolute: 0.1 10*3/uL (ref 0.0–0.7)
Eosinophils Relative: 1 % (ref 0–5)
HCT: 38.4 % (ref 36.0–46.0)
Hemoglobin: 12.6 g/dL (ref 12.0–15.0)
Lymphocytes Relative: 33 % (ref 12–46)
Lymphs Abs: 2.2 10*3/uL (ref 0.7–4.0)
MCH: 32.3 pg (ref 26.0–34.0)
MCHC: 32.8 g/dL (ref 30.0–36.0)
MCV: 98.5 fL (ref 78.0–100.0)
Monocytes Absolute: 0.5 10*3/uL (ref 0.1–1.0)
Monocytes Relative: 8 % (ref 3–12)
Neutro Abs: 4 10*3/uL (ref 1.7–7.7)
Neutrophils Relative %: 58 % (ref 43–77)
Platelets: 211 10*3/uL (ref 150–400)
RBC: 3.9 MIL/uL (ref 3.87–5.11)
RDW: 14 % (ref 11.5–15.5)
WBC: 6.8 10*3/uL (ref 4.0–10.5)

## 2014-02-18 LAB — PROTIME-INR
INR: 1.08 (ref 0.00–1.49)
Prothrombin Time: 14 seconds (ref 11.6–15.2)

## 2014-02-19 ENCOUNTER — Ambulatory Visit (HOSPITAL_BASED_OUTPATIENT_CLINIC_OR_DEPARTMENT_OTHER): Payer: Medicare Other | Admitting: Anesthesiology

## 2014-02-19 ENCOUNTER — Ambulatory Visit (HOSPITAL_COMMUNITY)
Admission: RE | Admit: 2014-02-19 | Discharge: 2014-02-19 | Disposition: A | Discharge status: Home / Self Care | Payer: Medicare Other | Source: Ambulatory Visit | Attending: General Surgery | Admitting: General Surgery

## 2014-02-19 ENCOUNTER — Encounter (HOSPITAL_BASED_OUTPATIENT_CLINIC_OR_DEPARTMENT_OTHER): Payer: Medicare Other | Admitting: Anesthesiology

## 2014-02-19 ENCOUNTER — Encounter (HOSPITAL_BASED_OUTPATIENT_CLINIC_OR_DEPARTMENT_OTHER): Payer: Self-pay | Admitting: *Deleted

## 2014-02-19 ENCOUNTER — Ambulatory Visit (HOSPITAL_BASED_OUTPATIENT_CLINIC_OR_DEPARTMENT_OTHER)
Admission: RE | Admit: 2014-02-19 | Discharge: 2014-02-20 | Disposition: A | Discharge status: Home / Self Care | Payer: Medicare Other | Source: Ambulatory Visit | Attending: General Surgery | Admitting: General Surgery

## 2014-02-19 ENCOUNTER — Encounter (HOSPITAL_BASED_OUTPATIENT_CLINIC_OR_DEPARTMENT_OTHER)
Admission: RE | Disposition: A | Discharge status: Home / Self Care | Payer: Self-pay | Source: Ambulatory Visit | Attending: General Surgery

## 2014-02-19 ENCOUNTER — Ambulatory Visit
Admission: RE | Admit: 2014-02-19 | Discharge: 2014-02-19 | Disposition: A | Discharge status: Home / Self Care | Payer: Medicare Other | Source: Ambulatory Visit | Attending: General Surgery | Admitting: General Surgery

## 2014-02-19 ENCOUNTER — Other Ambulatory Visit (INDEPENDENT_AMBULATORY_CARE_PROVIDER_SITE_OTHER): Payer: Self-pay | Admitting: General Surgery

## 2014-02-19 DIAGNOSIS — Z91040 Latex allergy status: Secondary | ICD-10-CM | POA: Insufficient documentation

## 2014-02-19 DIAGNOSIS — Z888 Allergy status to other drugs, medicaments and biological substances status: Secondary | ICD-10-CM | POA: Insufficient documentation

## 2014-02-19 DIAGNOSIS — Z79899 Other long term (current) drug therapy: Secondary | ICD-10-CM | POA: Insufficient documentation

## 2014-02-19 DIAGNOSIS — Z7982 Long term (current) use of aspirin: Secondary | ICD-10-CM | POA: Insufficient documentation

## 2014-02-19 DIAGNOSIS — C50911 Malignant neoplasm of unspecified site of right female breast: Secondary | ICD-10-CM

## 2014-02-19 DIAGNOSIS — E785 Hyperlipidemia, unspecified: Secondary | ICD-10-CM | POA: Insufficient documentation

## 2014-02-19 DIAGNOSIS — Z9010 Acquired absence of unspecified breast and nipple: Secondary | ICD-10-CM | POA: Insufficient documentation

## 2014-02-19 DIAGNOSIS — I2510 Atherosclerotic heart disease of native coronary artery without angina pectoris: Secondary | ICD-10-CM | POA: Diagnosis not present

## 2014-02-19 DIAGNOSIS — F329 Major depressive disorder, single episode, unspecified: Secondary | ICD-10-CM | POA: Insufficient documentation

## 2014-02-19 DIAGNOSIS — C50919 Malignant neoplasm of unspecified site of unspecified female breast: Secondary | ICD-10-CM | POA: Diagnosis not present

## 2014-02-19 DIAGNOSIS — J438 Other emphysema: Secondary | ICD-10-CM | POA: Insufficient documentation

## 2014-02-19 DIAGNOSIS — I10 Essential (primary) hypertension: Secondary | ICD-10-CM | POA: Insufficient documentation

## 2014-02-19 DIAGNOSIS — Z882 Allergy status to sulfonamides status: Secondary | ICD-10-CM | POA: Insufficient documentation

## 2014-02-19 DIAGNOSIS — Z01818 Encounter for other preprocedural examination: Secondary | ICD-10-CM | POA: Diagnosis not present

## 2014-02-19 DIAGNOSIS — Z87891 Personal history of nicotine dependence: Secondary | ICD-10-CM | POA: Insufficient documentation

## 2014-02-19 DIAGNOSIS — Z886 Allergy status to analgesic agent status: Secondary | ICD-10-CM | POA: Insufficient documentation

## 2014-02-19 DIAGNOSIS — F3289 Other specified depressive episodes: Secondary | ICD-10-CM | POA: Insufficient documentation

## 2014-02-19 DIAGNOSIS — Z880 Allergy status to penicillin: Secondary | ICD-10-CM | POA: Insufficient documentation

## 2014-02-19 HISTORY — DX: Presence of dental prosthetic device (complete) (partial): Z97.2

## 2014-02-19 SURGERY — RADIOACTIVE SEED GUIDED PARTIAL MASTECTOMY WITH AXILLARY SENTINEL LYMPH NODE BIOPSY
Anesthesia: General | Site: Breast | Laterality: Right

## 2014-02-19 MED ORDER — SODIUM CHLORIDE 0.9 % IJ SOLN
INTRAMUSCULAR | Status: DC | PRN
  Administered 2014-02-19: 12:00:00 via INTRAMUSCULAR

## 2014-02-19 MED ORDER — OXYCODONE HCL 5 MG PO TABS: 5.0000 mg | ORAL_TABLET | ORAL | Status: DC | PRN

## 2014-02-19 MED ORDER — FENTANYL CITRATE 0.05 MG/ML IJ SOLN
50.0000 ug | INTRAMUSCULAR | Status: DC | PRN
  Administered 2014-02-19: 100 ug via INTRAVENOUS

## 2014-02-19 MED ORDER — MIDAZOLAM HCL 5 MG/5ML IJ SOLN
INTRAMUSCULAR | Status: DC | PRN
  Administered 2014-02-19: 1 mg via INTRAVENOUS

## 2014-02-19 MED ORDER — MIDAZOLAM HCL 2 MG/2ML IJ SOLN
INTRAMUSCULAR | Status: AC
  Filled 2014-02-19: qty 2

## 2014-02-19 MED ORDER — HYDROMORPHONE HCL PF 1 MG/ML IJ SOLN
0.2500 mg | INTRAMUSCULAR | Status: DC | PRN
  Administered 2014-02-19: 0.25 mg via INTRAVENOUS
  Administered 2014-02-19: 0.5 mg via INTRAVENOUS
  Administered 2014-02-19: 0.25 mg via INTRAVENOUS

## 2014-02-19 MED ORDER — FENTANYL CITRATE 0.05 MG/ML IJ SOLN
INTRAMUSCULAR | Status: DC | PRN
Start: 2014-02-19 — End: 2014-02-19
  Administered 2014-02-19 (×3): 25 ug via INTRAVENOUS
  Administered 2014-02-19: 50 ug via INTRAVENOUS
  Administered 2014-02-19: 25 ug via INTRAVENOUS

## 2014-02-19 MED ORDER — ONDANSETRON HCL 4 MG/2ML IJ SOLN: 4.0000 mg | INTRAMUSCULAR | Status: DC | PRN

## 2014-02-19 MED ORDER — FENTANYL CITRATE 0.05 MG/ML IJ SOLN
INTRAMUSCULAR | Status: AC
  Filled 2014-02-19: qty 2

## 2014-02-19 MED ORDER — FENTANYL CITRATE 0.05 MG/ML IJ SOLN
INTRAMUSCULAR | Status: AC
  Filled 2014-02-19: qty 6

## 2014-02-19 MED ORDER — DEXTROSE IN LACTATED RINGERS 5 % IV SOLN
INTRAVENOUS | Status: DC
  Administered 2014-02-19 – 2014-02-20 (×2): via INTRAVENOUS

## 2014-02-19 MED ORDER — PROMETHAZINE HCL 25 MG/ML IJ SOLN
INTRAMUSCULAR | Status: AC
  Filled 2014-02-19: qty 1

## 2014-02-19 MED ORDER — LACTATED RINGERS IV SOLN
INTRAVENOUS | Status: DC | PRN
  Administered 2014-02-19: 12:00:00 via INTRAVENOUS

## 2014-02-19 MED ORDER — GLYCOPYRROLATE 0.2 MG/ML IJ SOLN
INTRAMUSCULAR | Status: DC | PRN
  Administered 2014-02-19: 0.2 mg via INTRAVENOUS

## 2014-02-19 MED ORDER — MIDAZOLAM HCL 2 MG/2ML IJ SOLN: 1.0000 mg | INTRAMUSCULAR | Status: DC | PRN

## 2014-02-19 MED ORDER — LIDOCAINE HCL (CARDIAC) 20 MG/ML IV SOLN
INTRAVENOUS | Status: DC | PRN
  Administered 2014-02-19: 40 mg via INTRAVENOUS

## 2014-02-19 MED ORDER — PROMETHAZINE HCL 25 MG/ML IJ SOLN
6.2500 mg | Freq: Two times a day (BID) | INTRAMUSCULAR | Status: DC | PRN
  Administered 2014-02-19: 6.25 mg via INTRAVENOUS

## 2014-02-19 MED ORDER — SODIUM CHLORIDE 0.9 % IJ SOLN
INTRAMUSCULAR | Status: AC
  Filled 2014-02-19: qty 10

## 2014-02-19 MED ORDER — HYDROMORPHONE HCL PF 1 MG/ML IJ SOLN
INTRAMUSCULAR | Status: AC
  Filled 2014-02-19: qty 1

## 2014-02-19 MED ORDER — VANCOMYCIN HCL IN DEXTROSE 1-5 GM/200ML-% IV SOLN
INTRAVENOUS | Status: AC
  Filled 2014-02-19: qty 200

## 2014-02-19 MED ORDER — PROPOFOL 10 MG/ML IV BOLUS
INTRAVENOUS | Status: DC | PRN
  Administered 2014-02-19: 150 mg via INTRAVENOUS

## 2014-02-19 MED ORDER — ONDANSETRON HCL 4 MG PO TABS: 4.0000 mg | ORAL_TABLET | Freq: Four times a day (QID) | ORAL | Status: DC | PRN

## 2014-02-19 MED ORDER — BUPIVACAINE HCL (PF) 0.5 % IJ SOLN
INTRAMUSCULAR | Status: DC | PRN
  Administered 2014-02-19: 15 mL

## 2014-02-19 MED ORDER — VANCOMYCIN HCL IN DEXTROSE 1-5 GM/200ML-% IV SOLN
1000.0000 mg | INTRAVENOUS | Status: AC
  Administered 2014-02-19: 1000 mg via INTRAVENOUS

## 2014-02-19 MED ORDER — LACTATED RINGERS IV SOLN
INTRAVENOUS | Status: DC
Start: 2014-02-19 — End: 2014-02-20
  Administered 2014-02-19: 14:00:00 via INTRAVENOUS

## 2014-02-19 MED ORDER — BUPIVACAINE HCL (PF) 0.5 % IJ SOLN
INTRAMUSCULAR | Status: AC
  Filled 2014-02-19: qty 30

## 2014-02-19 MED ORDER — DEXAMETHASONE SODIUM PHOSPHATE 4 MG/ML IJ SOLN
INTRAMUSCULAR | Status: DC | PRN
  Administered 2014-02-19: 8 mg via INTRAVENOUS

## 2014-02-19 MED ORDER — OXYCODONE-ACETAMINOPHEN 5-325 MG PO TABS
1.0000 | ORAL_TABLET | ORAL | Status: DC | PRN
  Administered 2014-02-19 – 2014-02-20 (×4): 1 via ORAL
  Filled 2014-02-19 (×4): qty 1

## 2014-02-19 MED ORDER — MORPHINE SULFATE 2 MG/ML IJ SOLN: 2.0000 mg | INTRAMUSCULAR | Status: DC | PRN

## 2014-02-19 MED ORDER — METHYLENE BLUE 1 % INJ SOLN
INTRAMUSCULAR | Status: AC
  Filled 2014-02-19: qty 10

## 2014-02-19 MED ORDER — EPHEDRINE SULFATE 50 MG/ML IJ SOLN
INTRAMUSCULAR | Status: DC | PRN
  Administered 2014-02-19: 10 mg via INTRAVENOUS

## 2014-02-19 MED ORDER — TECHNETIUM TC 99M SULFUR COLLOID FILTERED
1.0000 | Freq: Once | INTRAVENOUS | Status: AC | PRN
  Administered 2014-02-19: 1 via INTRADERMAL

## 2014-02-19 SURGICAL SUPPLY — 55 items
APPLIER CLIP 9.375 MED OPEN (MISCELLANEOUS) ×2
BENZOIN TINCTURE PRP APPL 2/3 (GAUZE/BANDAGES/DRESSINGS) ×2 IMPLANT
BINDER BREAST LRG (GAUZE/BANDAGES/DRESSINGS) IMPLANT
BINDER BREAST MEDIUM (GAUZE/BANDAGES/DRESSINGS) IMPLANT
BINDER BREAST XLRG (GAUZE/BANDAGES/DRESSINGS) ×2 IMPLANT
BINDER BREAST XXLRG (GAUZE/BANDAGES/DRESSINGS) IMPLANT
BLADE SURG 15 STRL LF DISP TIS (BLADE) ×2 IMPLANT
BLADE SURG 15 STRL SS (BLADE) ×2
CANISTER SUC SOCK COL 7IN (MISCELLANEOUS) IMPLANT
CANISTER SUCT 1200ML W/VALVE (MISCELLANEOUS) ×2 IMPLANT
CHLORAPREP W/TINT 26ML (MISCELLANEOUS) ×2 IMPLANT
CLIP APPLIE 9.375 MED OPEN (MISCELLANEOUS) ×1 IMPLANT
COVER MAYO STAND STRL (DRAPES) ×2 IMPLANT
COVER PROBE W GEL 5X96 (DRAPES) ×2 IMPLANT
COVER TABLE BACK 60X90 (DRAPES) ×2 IMPLANT
DECANTER SPIKE VIAL GLASS SM (MISCELLANEOUS) IMPLANT
DEVICE DUBIN W/COMP PLATE 8390 (MISCELLANEOUS) ×2 IMPLANT
DRAIN CHANNEL 19F RND (DRAIN) ×2 IMPLANT
DRAPE PED LAPAROTOMY (DRAPES) ×2 IMPLANT
DRAPE UTILITY XL STRL (DRAPES) ×2 IMPLANT
ELECT COATED BLADE 2.86 ST (ELECTRODE) ×2 IMPLANT
ELECT REM PT RETURN 9FT ADLT (ELECTROSURGICAL) ×2
ELECTRODE REM PT RTRN 9FT ADLT (ELECTROSURGICAL) ×1 IMPLANT
EVACUATOR SILICONE 100CC (DRAIN) ×2 IMPLANT
GAUZE SPONGE 4X4 12PLY STRL (GAUZE/BANDAGES/DRESSINGS) ×2 IMPLANT
GLOVE BIOGEL PI IND STRL 6.5 (GLOVE) ×1 IMPLANT
GLOVE BIOGEL PI IND STRL 7.0 (GLOVE) ×1 IMPLANT
GLOVE BIOGEL PI IND STRL 8.5 (GLOVE) ×1 IMPLANT
GLOVE BIOGEL PI INDICATOR 6.5 (GLOVE) ×1
GLOVE BIOGEL PI INDICATOR 7.0 (GLOVE) ×1
GLOVE BIOGEL PI INDICATOR 8.5 (GLOVE) ×1
GLOVE ECLIPSE 8.0 STRL XLNG CF (GLOVE) IMPLANT
GLOVE SURG SS PI 7.0 STRL IVOR (GLOVE) ×2 IMPLANT
GLOVE SURG SS PI 8.0 STRL IVOR (GLOVE) ×2 IMPLANT
GOWN STRL REUS W/ TWL LRG LVL3 (GOWN DISPOSABLE) ×3 IMPLANT
GOWN STRL REUS W/TWL LRG LVL3 (GOWN DISPOSABLE) ×3
KIT MARKER MARGIN INK (KITS) IMPLANT
NEEDLE HYPO 25X1 1.5 SAFETY (NEEDLE) ×4 IMPLANT
NS IRRIG 1000ML POUR BTL (IV SOLUTION) ×2 IMPLANT
PACK BASIN DAY SURGERY FS (CUSTOM PROCEDURE TRAY) ×2 IMPLANT
PENCIL BUTTON HOLSTER BLD 10FT (ELECTRODE) ×2 IMPLANT
SLEEVE SCD COMPRESS KNEE MED (MISCELLANEOUS) ×2 IMPLANT
SLEEVE SURGEON STRL (DRAPES) ×2 IMPLANT
SPONGE GAUZE 4X4 12PLY STER LF (GAUZE/BANDAGES/DRESSINGS) ×2 IMPLANT
SPONGE LAP 4X18 X RAY DECT (DISPOSABLE) ×4 IMPLANT
STRIP CLOSURE SKIN 1/2X4 (GAUZE/BANDAGES/DRESSINGS) ×2 IMPLANT
SUT ETHILON 3 0 PS 1 (SUTURE) ×2 IMPLANT
SUT MON AB 4-0 PC3 18 (SUTURE) ×2 IMPLANT
SUT SILK 2 0 FS (SUTURE) ×2 IMPLANT
SUT VICRYL 3-0 CR8 SH (SUTURE) ×4 IMPLANT
SYR CONTROL 10ML LL (SYRINGE) ×4 IMPLANT
TOWEL OR 17X24 6PK STRL BLUE (TOWEL DISPOSABLE) ×2 IMPLANT
TOWEL OR NON WOVEN STRL DISP B (DISPOSABLE) ×2 IMPLANT
TUBE CONNECTING 20X1/4 (TUBING) ×2 IMPLANT
YANKAUER SUCT BULB TIP NO VENT (SUCTIONS) ×2 IMPLANT

## 2014-02-19 NOTE — Progress Notes (Signed)
Dr. Ola Spurr notified of oxygen sat at 91-92 on room air.  Patient placed on 2 liters for transfer and admittance to [facility]

## 2014-02-19 NOTE — Discharge Instructions (Addendum)
Post Anesthesia Home Care Instructions  Activity: Get plenty of rest for the remainder of the day. A responsible adult should stay with you for 24 hours following the procedure.  For the next 24 hours, DO NOT: -Drive a car -Paediatric nurse -Drink alcoholic beverages -Take any medication unless instructed by your physician -Make any legal decisions or sign important papers.  Meals: Start with liquid foods such as gelatin or soup. Progress to regular foods as tolerated. Avoid greasy, spicy, heavy foods. If nausea and/or vomiting occur, drink only clear liquids until the nausea and/or vomiting subsides. Call your physician if vomiting continues.  Special Instructions/Symptoms: Your throat may feel dry or sore from the anesthesia or the breathing tube placed in your throat during surgery. If this causes discomfort, gargle with warm salt water. The discomfort should disappear within 24 hours.   [place] Office Phone Number [phone]  BREAST BIOPSY/ PARTIAL MASTECTOMY: POST OP INSTRUCTIONS  Always review your discharge instruction sheet given to you by the facility where your surgery was performed.  IF YOU HAVE DISABILITY OR FAMILY LEAVE FORMS, YOU MUST BRING THEM TO THE OFFICE FOR PROCESSING.  DO NOT GIVE THEM TO YOUR DOCTOR.  1. A prescription for pain medication may be given to you upon discharge.  Take your pain medication as prescribed, if needed.  If narcotic pain medicine is not needed, then you may take acetaminophen (Tylenol) or ibuprofen (Advil) as needed. 2. Take your usually prescribed medications unless otherwise directed 3. If you need a refill on your pain medication, please contact your pharmacy.  They will contact our office to request authorization.  Prescriptions will not be filled after 5pm or on week-ends. 4. You should eat very light the first 24 hours after surgery, such as soup, crackers, pudding, etc.  Resume your normal diet the day after  surgery. 5. Most patients will experience some swelling and bruising in the breast.  Ice packs and a good support bra will help.  Swelling and bruising can take several days to resolve.  6. It is common to experience some constipation if taking pain medication after surgery.  Increasing fluid intake and taking a stool softener will usually help or prevent this problem from occurring.  A mild laxative (Milk of Magnesia or Miralax) should be taken according to package directions if there are no bowel movements after 48 hours. 7. Keep the breast binder on and dry.  If your surgeon used skin glue on the incision, you may shower in 24 hours.  The glue will flake off over the next 2-3 weeks.  Any sutures or staples will be removed at the office during your follow-up visit. a. ACTIVITIES:  No heavy lifting or straining with right arm until released by M.D. You may drive when you no longer are taking prescription pain medication, the drain is out, you can comfortably wear a seatbelt, and you can safely maneuver your car and apply brakes. b. RETURN TO WORK:  ______________________________________________________________________________________ 8. You should see your doctor in the office for a follow-up appointment Wednesday, September 16 at 8:45 am. 9. OTHER INSTRUCTIONS: ___Empty drain and record output twice a day.__Avoid needle sticks and blood pressure checks in the right arm._________________________________________________________________________________________ _____________________________________________________________________________________________________________________________________ _____________________________________________________________________________________________________________________________________ _____________________________________________________________________________________________________________________________________  WHEN TO CALL YOUR DOCTOR: 1. Fever over  101.0 2. Nausea and/or vomiting. 3. Extreme swelling or bruising. 4. Continued bleeding from incision. 5. Increased pain, redness, or drainage from the incision.  The clinic staff is available to answer your questions during regular  business hours.  Please dont hesitate to call and ask to speak to one of the nurses for clinical concerns.  If you have a medical emergency, go to the nearest emergency room or call 911.  A surgeon from [facility] is always on call at the hospital.  For further questions, please visit [email or web address]   About my Jackson-Pratt Bulb Drain  What is a Jackson-Pratt bulb? A Jackson-Pratt is a soft, round device used to collect drainage. It is connected to a long, thin drainage catheter, which is held in place by one or two small stiches near your surgical incision site. When the bulb is squeezed, it forms a vacuum, forcing the drainage to empty into the bulb.  Emptying the Jackson-Pratt bulb- To empty the bulb: 1. Release the plug on the top of the bulb. 2. Pour the bulb's contents into a measuring container which your nurse will provide. 3. Record the time emptied and amount of drainage. Empty the drain(s) as often as your     doctor or nurse recommends.  Date                  Time                    Amount (Drain 1)                 Amount (Drain 2)  _____________________________________________________________________  _____________________________________________________________________  _____________________________________________________________________  _____________________________________________________________________  _____________________________________________________________________  _____________________________________________________________________  _____________________________________________________________________  _____________________________________________________________________  Squeezing the  Jackson-Pratt Bulb- To squeeze the bulb: 1. Make sure the plug at the top of the bulb is open. 2. Squeeze the bulb tightly in your fist. You will hear air squeezing from the bulb. 3. Replace the plug while the bulb is squeezed. 4. Use a safety pin to attach the bulb to your clothing. This will keep the catheter from     pulling at the bulb insertion site.  When to call your doctor- Call your doctor if:  Drain site becomes red, swollen or hot.  You have a fever greater than 101 degrees F.  There is oozing at the drain site.  Drain falls out (apply a guaze bandage over the drain hole and secure it with tape).  Drainage increases daily not related to activity patterns. (You will usually have more drainage when you are active than when you are resting.)  Drainage has a bad odor.

## 2014-02-19 NOTE — H&P (View-Only) (Signed)
Patient ID: Brittany Kirk, female   DOB: 01/28/1946, 68 y.o.   MRN: [id number]  Chief Complaint  Patient presents with  . New Evaluation    Breast Cancer    HPI Brittany Kirk is a 68 y.o. female.   HPI  She is referred by Dr. Reid because of a new diagnosis of invasive right breast cancer. She had a screening detected abnormality on her mammogram on the right side at the 10:00 position. Image guided biopsy demonstrated invasive ductal carcinoma.  The lesion was 7.5 mm in size. No enlarged right axillary lymph nodes were noted. The cancer is ER/PR positive, HER-2/neu negative, proliferation rate is 12%.  She has a first cousin with breast cancer. Her situation was discussed at the multidisciplinary breast cancer, this morning and she is felt to be a good candidate for breast conservation therapy. It was also felt that an  Oncotype should be sent.  Past Medical History  Diagnosis Date  . Hyperlipidemia   . Hypertension   . Depression   . Tobacco user   . History of skin cancer   . Allergic rhinitis   . Cancer   . CAD (coronary artery disease)   . Emphysema     no longer uses inhalers  . Shortness of breath     sob if walks up flight of stairs  . Gallstones     nausea, pain upper right abdomen  . H/O hiatal hernia   . GERD (gastroesophageal reflux disease)   . Complication of anesthesia     sore throat after a surgery  . Asymptomatic bilateral carotid artery stenosis     Past Surgical History  Procedure Laterality Date  . Tubal ligation    . Tonsilectomy, adenoidectomy, bilateral myringotomy and tubes    . Cholecystectomy  07/08/2012    Procedure: LAPAROSCOPIC CHOLECYSTECTOMY WITH INTRAOPERATIVE CHOLANGIOGRAM;  Surgeon: Eric M Wilson, MD,FACS;  Location: [facility];  Service: General;  Laterality: N/A;  Laparoscopic Cholecystectomy with Intraoperative Cholangiogram    Family History  Problem Relation Age of Onset  . Heart disease      5 brothers and 2 sister  . Arthritis  Mother   . Diverticulitis Sister     Social History History  Substance Use Topics  . Smoking status: Former Smoker -- 0.50 packs/day for 40 years    Types: Cigarettes  . Smokeless tobacco: Never Used  . Alcohol Use: No    Allergies  Allergen Reactions  . Latex   . Cefaclor Itching, Swelling and Rash  . Metronidazole Itching, Swelling and Rash  . Naproxen Itching, Swelling and Rash  . Nsaids Itching, Swelling and Rash  . Penicillins Itching, Swelling and Rash  . Septra [Bactrim] Itching, Swelling and Rash  . Sulfonamide Derivatives Itching, Swelling and Rash    Current Outpatient Prescriptions  Medication Sig Dispense Refill  . aspirin 81 MG tablet Take 81 mg by mouth daily.      . Calcium-Vitamin D-Vitamin K (CALCIUM SOFT CHEWS PO) Take 1 each by mouth daily. Takes 1-2 daily when remembers      . Cholecalciferol (VITAMIN D3) 5000 UNITS CAPS Take 5,000 Units by mouth daily.      . Coenzyme Q10 (CO Q 10) 100 MG CAPS Take by mouth.      . cyanocobalamin 2000 MCG tablet Take 2,000 mcg by mouth daily. B12 slow release tablet daily      . escitalopram (LEXAPRO) 20 MG tablet Take 20 mg by mouth daily.      .   losartan (COZAAR) 50 MG tablet TAKE 1 TABLET (50 MG TOTAL) BY MOUTH DAILY.  90 tablet  3  . Multiple Vitamin (MULTIVITAMIN) tablet Take 1 tablet by mouth daily.      . atorvastatin (LIPITOR) 20 MG tablet Take 1 tablet (20 mg total) by mouth daily.  90 tablet  1  . azithromycin (ZITHROMAX) 250 MG tablet Day 1: Take 2 daily.  Days 2-5: Take 1 daily.  6 tablet  0  . VITAMIN E PO Take 3,000 mcg by mouth daily.       No current facility-administered medications for this visit.    Review of Systems Review of Systems  Constitutional: Negative.   HENT: Negative.   Respiratory: Negative.   Cardiovascular: Negative.   Gastrointestinal: Negative.   Genitourinary: Negative.   Neurological: Negative.   Hematological: Bruises/bleeds easily.    Blood pressure 156/80, pulse 67,  temperature 98.1 F (36.7 C), temperature source Oral, height 5' 1" (1.549 m), weight 167 lb (75.751 kg).  Physical Exam Physical Exam  Constitutional: She appears well-developed and well-nourished. No distress.  HENT:  Head: Normocephalic and atraumatic.  Eyes: EOM are normal. No scleral icterus.  Cardiovascular: Normal rate and regular rhythm.   Pulmonary/Chest: Effort normal and breath sounds normal.  Right breast demonstrates an ecchymosis in the upper-outer quadrant but no dominant masses. Left breast demonstrates no dominant masses or suspicious skin changes.  Abdominal: Soft. She exhibits no mass. There is no tenderness.  Musculoskeletal:  No axillary or supraclavicular adenopathy.  Deep into rubor is noted in the feet  Lymphadenopathy:    She has no cervical adenopathy.  Neurological: She is alert.  Skin: Skin is warm and dry.  Psychiatric: She has a normal mood and affect. Her behavior is normal.    Data Reviewed Mammogram, ultrasound, pathology report.  Assessment    Invasive right breast cancer. She is a good candidate for breast conservation therapy. We discussed this versus mastectomy. We discussed the possible need for radiation and endocrine therapy postoperatively. She was light to proceed with breast conservation therapy.     Plan    Right breast partial mastectomy after radioactive seed localization, right axilla sentinel lymph node biopsy.I have explained the procedure, risks, and aftercare.  The risks include but are not limited to bleeding, infection, wound problems, seroma formation, anesthesia, nerve injury, lymphedema, need for reexcision or removal of more lymph nodes at a later time.  She seems to understand and agrees with the plan.        Brittany Kirk 02/03/2014, 12:43 PM    

## 2014-02-19 NOTE — Interval H&P Note (Signed)
History and Physical Interval Note:  02/19/2014 11:27 AM  Brittany Kirk  has presented today for surgery, with the diagnosis of right breast cancer  The various methods of treatment have been discussed with the patient and family. After consideration of risks, benefits and other options for treatment, the patient has consented to  Procedure(s):  LOCALIZATION RIGHT BREAST LUMPECTOMY AND RIGHT AXILLARY SENTINEL LYMPH NODE BIOPSY (Right) as a surgical intervention .  The patient's history has been reviewed, patient examined, no change in status, stable for surgery.  I have reviewed the patient's chart and labs.  Questions were answered to the patient's satisfaction.     Princess Karnes Lenna Sciara

## 2014-02-19 NOTE — Transfer of Care (Signed)
Immediate Anesthesia Transfer of Care Note  Patient: Brittany Kirk  Procedure(s) Performed: Procedure(s) with comments:  LOCALIZATION RIGHT BREAST LUMPECTOMY AND RIGHT AXILLARY SENTINEL LYMPH NODE BIOPSY (Right) - Right breast partial mastectomy with radio active seed localization and axillary node dissection  Patient Location: PACU  Anesthesia Type:General  Level of Consciousness: awake, sedated and patient cooperative  Airway & Oxygen Therapy: Patient Spontanous Breathing and Patient connected to face mask oxygen  Post-op Assessment: Report given to PACU RN and Post -op Vital signs reviewed and stable  Post vital signs: Reviewed and stable  Complications: No apparent anesthesia complications

## 2014-02-19 NOTE — Anesthesia Postprocedure Evaluation (Signed)
  Anesthesia Post-op Note  Patient: Brittany Kirk  Procedure(s) Performed: Procedure(s) with comments:  LOCALIZATION RIGHT BREAST LUMPECTOMY AND RIGHT AXILLARY SENTINEL LYMPH NODE BIOPSY (Right) - Right breast partial mastectomy with radio active seed localization and axillary node dissection  Patient Location: PACU  Anesthesia Type:General  Level of Consciousness: awake and alert   Airway and Oxygen Therapy: Patient Spontanous Breathing  Post-op Pain: moderate  Post-op Assessment: Post-op Vital signs reviewed, Patient's Cardiovascular Status Stable and Respiratory Function Stable  Post-op Vital Signs: Reviewed  Filed Vitals:   02/19/14 1500  BP:   Pulse:   Temp: 36.1 C  Resp:     Complications: No apparent anesthesia complications

## 2014-02-19 NOTE — Anesthesia Preprocedure Evaluation (Signed)
Anesthesia Evaluation  Patient identified by MRN, date of birth, ID band Patient awake    Reviewed: Allergy & Precautions, H&P , NPO status , Patient's Chart, lab work & pertinent test results  Airway Mallampati: II TM Distance: >3 FB Neck ROM: Full    Dental no notable dental hx. (+) Upper Dentures, Dental Advisory Given   Pulmonary neg pulmonary ROS, COPDformer smoker,  breath sounds clear to auscultation  Pulmonary exam normal       Cardiovascular hypertension, Pt. on medications + CAD and + Peripheral Vascular Disease negative cardio ROS  Rhythm:Regular Rate:Normal     Neuro/Psych Depression negative neurological ROS     GI/Hepatic Neg liver ROS, GERD-  Medicated and Controlled,  Endo/Other  negative endocrine ROS  Renal/GU negative Renal ROS  negative genitourinary   Musculoskeletal   Abdominal   Peds  Hematology negative hematology ROS (+)   Anesthesia Other Findings   Reproductive/Obstetrics negative OB ROS                           Anesthesia Physical Anesthesia Plan  ASA: III  Anesthesia Plan: General   Post-op Pain Management:    Induction: Intravenous  Airway Management Planned: LMA  Additional Equipment:   Intra-op Plan:   Post-operative Plan: Extubation in OR  Informed Consent: I have reviewed the patients History and Physical, chart, labs and discussed the procedure including the risks, benefits and alternatives for the proposed anesthesia with the patient or authorized representative who has indicated his/her understanding and acceptance.   Dental advisory given  Plan Discussed with: CRNA  Anesthesia Plan Comments: (Pt declines PECS block.)        Anesthesia Quick Evaluation

## 2014-02-19 NOTE — Op Note (Signed)
Operative Note  Brittany Kirk female 68 y.o. 02/19/2014  PREOPERATIVE DX:  Invasive right breast cancer  POSTOPERATIVE DX:  Same  PROCEDURE: 1.  Right axillary lymphatic mapping. 2. Injection of blue dye into the right breast. 3. Right partial mastectomy after radioactive seed localization. 4. Right axillary lymph node dissection.         Surgeon: [name]   Assistants: none  Anesthesia: General LMA anesthesia  Indications: this is a 68 year old female who is recently been diagnosed with invasive right breast cancer at the 10:00 position. She now presents for the above procedures. She had a radioactive seed placed for localization in the upper-outer quadrant. She had successful injection of technetium radioactive fluid in the holding area in the breast.    Procedure Detail:  She was seen in the holding area. Using a neoprobe, I verified the C. Position at the 10:00 position of the right breast. The right breast was marked my initials. She was then brought to the operating room, placed upon the operating table and a general anesthetic was given. I then performed right axillary lymphatic mapping. There were no counts noted in the axilla. Subsequently, I injected 1.5 cc of methylene blue in each quadrant of the nipple areolar complex. This area was then massaged. The right breast and axillary areas were then sterilely prepped and draped.  Using the neoprobe, the radioactive seed was localized in the upper outer quadrant a curvilinear incision was made through the skin and subcutaneous tissues. Flaps were raised in all directions. The anterior aspect of the specimen was marked with a silk suture. Using the neoprobe I then performed a partial mastectomy, using a neoprobe to help with my margins in all directions. Once this had been performed, the medial aspect the specimen was marked with 2 sutures. A specimen mammogram was performed. The radioactive seed and clip as well as the  lesion were located in the middle of the specimen. The specimen was sent to pathology and the seed was retrieved. I performed a shave margins of the lateral, medial, superior, and inferior margin areas and marked them with sutures similar to what I had done with the partial mastectomy specimen. Following this, I injected Marcaine solution into the wound for local anesthetic effect.  Bleeding was controlled with electrocautery. Once hemostasis was adequate, the subcutaneous tissue was approximated with interrupted 3-0 Vicryl suture. The skin is close with 4 Monocryl subcuticular stitch.  I then approached the right axilla. Using the neoprobe there are still. I made a transverse incision in the lower axillary area and divided the subcutaneous tissues to enter the axillary content area. I used the neoprobe and again 0 counts were noted. I did not find any blue lymph nodes. Thus I decided to proceed with a right axillary lymph node dissection. I identified the right axillary vein. I identified the long thoracic nerve and the thoracodorsal nerve. I dissected the lymphatic contents inferior to the axillary vein in between these two nerves as well as underneath the pectoralis muscle for a level II dissection.  Small vessels and lymphatics were clipped. Once this tissue was removed I marked it as right axillary contents and sent it to pathology. Both nerves were tested and were functional. Bleeding was controlled electrocautery. A stab incision was made in the inferior aspect of the wound. A 19 Blake drain was introduced into the wound and sewn to the skin with 3-0 nylon suture. The subcutaneous tissue of the wound was closed with interrupted 3-0  Vicryl sutures. The skin is close with 4-0 Monocryl subcuticular stitch. Steri-Strips and sterile dressings were applied to both wounds. A breast binder was applied.  She tolerated the procedures without any apparent complications he was taken to recovery in satisfactory  condition.   Estimated Blood Loss:  200 mL         Drains: #19 Blake drain  Blood Given: none          Specimens: right partial mastectomy. Shave margins. Right axillary contents.        Complications:  * No complications entered in OR log *         Disposition: PACU - hemodynamically stable.         Condition: stable

## 2014-02-19 NOTE — Anesthesia Procedure Notes (Addendum)
Procedure Name: LMA Insertion Date/Time: 02/19/2014 11:49 AM Performed by: Lyndee Leo Pre-anesthesia Checklist: Patient identified, Emergency Drugs available, Suction available and Patient being monitored Patient Re-evaluated:Patient Re-evaluated prior to inductionOxygen Delivery Method: Circle System Utilized Preoxygenation: Pre-oxygenation with 100% oxygen Intubation Type: IV induction Ventilation: Mask ventilation without difficulty LMA: LMA inserted LMA Size: 4.0 Number of attempts: 1 Airway Equipment and Method: bite block Placement Confirmation: positive ETCO2 Tube secured with: Tape Dental Injury: Teeth and Oropharynx as per pre-operative assessment

## 2014-02-20 DIAGNOSIS — C50919 Malignant neoplasm of unspecified site of unspecified female breast: Secondary | ICD-10-CM | POA: Diagnosis not present

## 2014-02-20 NOTE — Discharge Summary (Signed)
Physician Discharge Summary  Patient ID: Brittany Kirk MRN: [id number] DOB/AGE: [birth year] 68 y.o.  Admit date: 02/19/2014 Discharge date: 02/20/2014  Admission Diagnoses:  Invasive right breast cancer  Discharge Diagnoses:  Principal Problem:   Breast cancer-right s/p right partial mastectomy and right axillary LND 02/19/14   Discharged Condition: good  Hospital Course: She did well overnight in recovery care and was able to be discharged on POD #1.  Consults: None  Significant Diagnostic Studies: none  Treatments: surgery: As above  Discharge Exam: Blood pressure 145/68, pulse 75, temperature 97.5 F (36.4 C), temperature source Oral, resp. rate 16, height 5\' 1"  (1.549 m), weight 169 lb 12.8 oz (77.021 kg), SpO2 97.00%.   Disposition: 01-Home or Self Care.  Follow up in office on 02/24/14.     Medication List         aspirin 81 MG tablet  Take 81 mg by mouth daily.     CALCIUM SOFT CHEWS PO  Take 1 each by mouth daily. Takes 1-2 daily when remembers     Co Q 10 100 MG Caps  Take by mouth.     cyanocobalamin 2000 MCG tablet  Take 2,000 mcg by mouth daily. B12 slow release tablet daily     escitalopram 20 MG tablet  Commonly known as:  LEXAPRO  Take 20 mg by mouth daily.     esomeprazole 40 MG capsule  Commonly known as:  NEXIUM  Take 40 mg by mouth daily at 12 noon.     losartan 50 MG tablet  Commonly known as:  COZAAR  TAKE 1 TABLET (50 MG TOTAL) BY MOUTH DAILY.     midazolam 2 MG/2ML Soln injection  Commonly known as:  VERSED  Inject 1-2 mLs (1-2 mg total) into the vein as needed for sedation.     multivitamin tablet  Take 1 tablet by mouth daily.     oxyCODONE 5 MG immediate release tablet  Commonly known as:  Oxy IR/ROXICODONE  Take 1-2 tablets (5-10 mg total) by mouth every 4 (four) hours as needed for moderate pain, severe pain or breakthrough pain.     Vitamin D3 5000 UNITS Caps  Take 5,000 Units by mouth daily.     VITAMIN E PO  Take  3,000 mcg by mouth daily.         Signed: [name] 02/20/2014, 7:27 AM

## 2014-02-20 NOTE — Progress Notes (Signed)
1 Day Post-Op  Subjective: A little sore.  Objective: Vital signs in last 24 hours: Temp:  [97 F (36.1 C)-98.5 F (36.9 C)] 97.5 F (36.4 C) (09/12 0358) Pulse Rate:  [60-85] 75 (09/12 0700) Resp:  [12-20] 16 (09/12 0700) BP: (133-170)/(50-85) 145/68 mmHg (09/12 0358) SpO2:  [94 %-100 %] 97 % (09/12 0700) Weight:  [169 lb 12.8 oz (77.021 kg)] 169 lb 12.8 oz (77.021 kg) (09/11 1016)    Intake/Output from previous day: 09/11 0701 - 09/12 0700 In: 2832 [P.O.:1132; I.V.:1700] Out: 2672.5 [Urine:2600; Drains:72.5] Intake/Output this shift:    PE: General- In NAD Right breast-bandage on and dry, no significant swelling, thin bloody drain output  Lab Results:   Recent Labs  02/18/14 1130  WBC 6.8  HGB 12.6  HCT 38.4  PLT 211   BMET  Recent Labs  02/18/14 1130  NA 143  K 4.5  CL 106  CO2 25  GLUCOSE 79  BUN 21  CREATININE 0.98  CALCIUM 8.9   PT/INR  Recent Labs  02/18/14 1130  LABPROT 14.0  INR 1.08   Comprehensive Metabolic Panel:    Component Value Date/Time   NA 143 02/18/2014 1130   NA 139 07/09/2013 0948   K 4.5 02/18/2014 1130   K 4.4 07/09/2013 0948   CL 106 02/18/2014 1130   CL 105 07/09/2013 0948   CO2 25 02/18/2014 1130   CO2 25 07/09/2013 0948   BUN 21 02/18/2014 1130   BUN 21 07/09/2013 0948   CREATININE 0.98 02/18/2014 1130   CREATININE 0.90 07/09/2013 0948   CREATININE 0.94 01/22/2013 1044   CREATININE 0.88 07/07/2012 0930   GLUCOSE 79 02/18/2014 1130   GLUCOSE 99 07/09/2013 0948   CALCIUM 8.9 02/18/2014 1130   CALCIUM 9.0 07/09/2013 0948   AST 15 02/18/2014 1130   AST 17 07/09/2013 0948   ALT 10 02/18/2014 1130   ALT 12 07/09/2013 0948   ALKPHOS 73 02/18/2014 1130   ALKPHOS 76 07/09/2013 0948   BILITOT 0.4 02/18/2014 1130   BILITOT 0.6 07/09/2013 0948   PROT 6.6 02/18/2014 1130   PROT 6.4 07/09/2013 0948   ALBUMIN 3.3* 02/18/2014 1130   ALBUMIN 3.9 07/09/2013 0948     Studies/Results: Nm Sentinel Node Inj-no Rpt (breast)  02/19/2014    CLINICAL DATA: invasive right breast cancer   Sulfur colloid was injected intradermally by the nuclear medicine  technologist for breast cancer sentinel node localization.    Mm Breast Surgical Specimen  02/19/2014   CLINICAL DATA:  Status post lumpectomy  EXAM: SPECIMEN RADIOGRAPH OF THE RIGHT BREAST  COMPARISON:  Previous exam(s)  FINDINGS: Status post excision of the right breast. The radioactive seed and biopsy marker clip are present, completely intact, and are marked for pathology.  IMPRESSION: Specimen radiograph of the right breast.   Electronically Signed   By: Skipper Cliche M.D.   On: 02/19/2014 12:51   Mm Rt Plc Breast Loc Dev   1st Lesion  Inc Mammo Guide  02/18/2014   CLINICAL DATA:  68 year old female with grade 1 invasive ductal carcinoma in the right breast. Preoperative radioactive seed localization.  EXAM: MAMMOGRAPHIC GUIDED RADIOACTIVE SEED LOCALIZATION OF THE RIGHT BREAST  COMPARISON:  Previous exam(s)  FINDINGS: Patient presents for radioactive seed localization prior to right breast partial mastectomy. I met with the patient and we discussed the procedure of seed localization including benefits and alternatives. We discussed the high likelihood of a successful procedure. We discussed the risks of the procedure  including infection, bleeding, tissue injury and further surgery. We discussed the low dose of radioactivity involved in the procedure. Informed, written consent was given.  The usual time-out protocol was performed immediately prior to the procedure.  Using mammographic guidance, sterile technique, 2% lidocaine and an I-125 radioactive seed, the heart shaped biopsy marking clip in the right breast at 10 o'clock was localized using a lateral approach. The follow-up mammogram images confirm the seed in the expected location and are marked for Dr. Zella Richer.  Follow-up survey of the patient confirms presence of radioactive seed.  Order number of I-125 seed:  [id number].  Dose of  I-125 seed:  0.229 mCi.  The patient tolerated the procedure well and was released from the [facility]. She was given instructions regarding seed removal.  IMPRESSION: Radioactive seed localization right breast. No apparent complications.   Electronically Signed   By: Everlean Alstrom M.D.   On: 02/18/2014 09:54    Anti-infectives: Anti-infectives   Start     Dose/Rate Route Frequency Ordered Stop   02/19/14 0959  vancomycin (VANCOCIN) IVPB 1000 mg/200 mL premix     1,000 mg 200 mL/hr over 60 Minutes Intravenous On call to O.R. 02/19/14 0959 02/19/14 1209      Assessment Principal Problem:   Breast cancer-right s/p right partial mastectomy and right axillary LND 02/19/14:  Did well overnight; conduct of operation discussed with her.    LOS: 1 day   Plan: Discharge.  Instructions given to her.   Brittany Kirk 02/20/2014

## 2014-02-24 ENCOUNTER — Other Ambulatory Visit (INDEPENDENT_AMBULATORY_CARE_PROVIDER_SITE_OTHER): Payer: Self-pay | Admitting: General Surgery

## 2014-02-24 ENCOUNTER — Other Ambulatory Visit: Payer: Self-pay | Admitting: Oncology

## 2014-02-24 DIAGNOSIS — C50911 Malignant neoplasm of unspecified site of right female breast: Secondary | ICD-10-CM

## 2014-02-26 ENCOUNTER — Other Ambulatory Visit (INDEPENDENT_AMBULATORY_CARE_PROVIDER_SITE_OTHER): Payer: Self-pay | Admitting: *Deleted

## 2014-02-26 DIAGNOSIS — C50911 Malignant neoplasm of unspecified site of right female breast: Secondary | ICD-10-CM

## 2014-03-01 NOTE — Progress Notes (Signed)
Location of Breast Cancer:Right Breast 10 o'clock position Upper outer quadrant  Histology per Pathology Report: Diagnosis: 01/26/14 Breast, right, needle core biopsy, 10 o'clock - INVASIVE DUCTAL CARCINOMA.- SEE COMMENT.Microscopic Comment  :The carcinoma is grade I.  Receptor Status: ER(pos), PR (pos), Her2-neu (neg.KI-67 12% proliferation rate)  Did patient present with symptoms (if so, please note symptoms) or was this found on screening mammography?:mammogram screening,  Past/Anticipated interventions by surgeon, if ZOX:[id number] : 02/19/14: 1. Breast, lumpectomy, right with radioactive seed- INVASIVE DUCTAL CARCINOMA, SEE COMMENT.- INVASIVE TUMOR IS 0.4 CM FROM NEAREST MARGIN (ANTERIOR)- PREVIOUS BIOPSY SITE. - SEE TUMOR SYNOPTIC TEMPLATE BELOW.2. Breast, excision, right medial margin- BENIGN BREAST TISSUE, SEE COMMENT.- SURGICAL MARGINS, NEGATIVE FOR ATYPIA OR MALIGNANCY.- NEGATIVE FOR ATYPIA OR MALIGNANCY.3. Breast, excision, right inferior margin- BENIGN BREAST TISSUE, SEE COMMENT.- SURGICAL MARGINS, NEGATIVE FOR ATYPIA OR MALIGNANCY.1 of 5 FINAL for ELLEIGH, CASSETTA K [phone]) Diagnosis(continued) - NEGATIVE FOR ATYPIA OR MALIGNANCY.4. Breast, excision, right lateral margin- BENIGN BREAST TISSUE, SEE COMMENT.- SURGICAL MARGINS, NEGATIVE FOR ATYPIA OR MALIGNANCY.- NEGATIVE FOR ATYPIA OR MALIGNANCY.5. Breast, excision, right superior margin- BENIGN BREAST TISSUE, SEE COMMENT.- SURGICAL MARGINS, NEGATIVE FOR ATYPIA OR MALIGNANCY.- NEGATIVE FOR ATYPIA OR MALIGNANCY.6. Lymph nodes, regional resection, right axillary- NINETEEN LYMPH NODES, NEGATIVE FOR TUMOR (0/19).- SEE COMMENT. DR. [name]   Past/Anticipated interventions by medical oncology, if any: Chemotherapy. No appointment scheduled.  Lymphedema issues, if NFA:OZHYQMVH edema  Pain issues, if any: Yes right axilla.  SAFETY ISSUES:  Prior radiation? No  Pacemaker/ICD?No  Possible current pregnancy?No.post  menopausal  Is the patient on methotrexate? no  Current Complaints / other details:Married.menses age 85, first live birth age 39.One child only.Drain tube removed 03/26/14.Scheduled for physical therapy on 03/08/14.    Arlyss Repress, RN 03/01/2014,11:10 AM

## 2014-03-03 ENCOUNTER — Ambulatory Visit: Payer: Medicare Other | Admitting: Radiation Oncology

## 2014-03-04 ENCOUNTER — Ambulatory Visit
Admission: RE | Admit: 2014-03-04 | Discharge: 2014-03-04 | Disposition: A | Discharge status: Home / Self Care | Payer: Medicare Other | Source: Ambulatory Visit | Attending: Radiation Oncology | Admitting: Radiation Oncology

## 2014-03-04 ENCOUNTER — Telehealth: Payer: Self-pay | Admitting: Oncology

## 2014-03-04 ENCOUNTER — Encounter: Payer: Self-pay | Admitting: Radiation Oncology

## 2014-03-04 ENCOUNTER — Other Ambulatory Visit: Payer: Self-pay | Admitting: Oncology

## 2014-03-04 ENCOUNTER — Encounter: Payer: Self-pay | Admitting: *Deleted

## 2014-03-04 VITALS — BP 133/57 | HR 65 | Temp 97.9°F | Wt 172.9 lb

## 2014-03-04 DIAGNOSIS — Z7982 Long term (current) use of aspirin: Secondary | ICD-10-CM | POA: Insufficient documentation

## 2014-03-04 DIAGNOSIS — Z9089 Acquired absence of other organs: Secondary | ICD-10-CM | POA: Diagnosis not present

## 2014-03-04 DIAGNOSIS — F329 Major depressive disorder, single episode, unspecified: Secondary | ICD-10-CM | POA: Insufficient documentation

## 2014-03-04 DIAGNOSIS — I10 Essential (primary) hypertension: Secondary | ICD-10-CM | POA: Diagnosis not present

## 2014-03-04 DIAGNOSIS — C50419 Malignant neoplasm of upper-outer quadrant of unspecified female breast: Secondary | ICD-10-CM | POA: Diagnosis not present

## 2014-03-04 DIAGNOSIS — C50411 Malignant neoplasm of upper-outer quadrant of right female breast: Secondary | ICD-10-CM

## 2014-03-04 DIAGNOSIS — F3289 Other specified depressive episodes: Secondary | ICD-10-CM | POA: Diagnosis not present

## 2014-03-04 DIAGNOSIS — Z79899 Other long term (current) drug therapy: Secondary | ICD-10-CM | POA: Insufficient documentation

## 2014-03-04 DIAGNOSIS — Z510 Encounter for antineoplastic radiation therapy: Secondary | ICD-10-CM | POA: Diagnosis not present

## 2014-03-04 DIAGNOSIS — Z170 Estrogen receptor positive status [ER+]: Secondary | ICD-10-CM | POA: Diagnosis not present

## 2014-03-04 DIAGNOSIS — Z87891 Personal history of nicotine dependence: Secondary | ICD-10-CM | POA: Insufficient documentation

## 2014-03-04 DIAGNOSIS — K219 Gastro-esophageal reflux disease without esophagitis: Secondary | ICD-10-CM | POA: Diagnosis not present

## 2014-03-04 NOTE — Progress Notes (Signed)
Please see the Nurse Progress Note in the MD Initial Consult Encounter for this patient. 

## 2014-03-04 NOTE — Telephone Encounter (Signed)
per pof to sch pt appt-cld & left message for pt for time & date

## 2014-03-04 NOTE — Progress Notes (Unsigned)
Ordered oncotype per Dr. Pablo Ledger.  Faxed requisition to pathology and confirmed receipt.

## 2014-03-05 ENCOUNTER — Telehealth: Payer: Self-pay | Admitting: *Deleted

## 2014-03-05 NOTE — Progress Notes (Addendum)
Radiation Oncology         [phone] ________________________________  Initial outpatient Consultation - Date: 03/04/2014   Name: Brittany Kirk MRN: [id number]   DOB: 05/10/1946  REFERRING PHYSICIAN: Rosenbower, Todd, MD  DIAGNOSIS:    ICD-9-CM ICD-10-CM  1. Malignant neoplasm of upper-outer quadrant of female breast, right 174.4 C50.411    HISTORY OF PRESENT ILLNESS::Brittany Kirk is a 68 y.o. female  Who presented for a screening mammogram and was found to have an abnormality in the right breast. Ultrasound biopsy showed an invasive ductal carcinoma which was 7 mm in size. Her left breast was negative. She elected for breast conservation and had a lumpectomy on 9/11showing a 1.4 cm tumor. Margins were negative. Her SLN did not map so she had a axillary node dissection which revealed 0/19 lymph nodes. Her tumor was strongly ER and PR positive. An oncotype has been sent. She would like to see Dr. Magrinat in medical oncology. She is seeing PT tomorrow for evaluation as she has limited motion of her right arm. 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 She is unsure if she will need to stay with him full time or if they will be able to find someone else. If she needs to stay with him, she might want her radiation at [facility].   She is GxP1 with her first child at 20. She is post menopausal and not on hormone replacement therapy. She had menses at 9. She is accompanied by her husband.   PREVIOUS RADIATION THERAPY: No  PAST MEDICAL HISTORY:  has a past medical history of Hyperlipidemia; Hypertension; Depression; Tobacco user; History of skin cancer; Allergic rhinitis; Cancer; CAD (coronary artery disease); Emphysema; Shortness of breath; Gallstones; H/O hiatal hernia; GERD (gastroesophageal reflux disease); Complication of anesthesia; Asymptomatic bilateral carotid artery stenosis; and Wears  dentures.    PAST SURGICAL HISTORY: Past Surgical History  Procedure Laterality Date  . Tubal ligation    . Tonsilectomy, adenoidectomy, bilateral myringotomy and tubes    . Cholecystectomy  07/08/2012    Procedure: LAPAROSCOPIC CHOLECYSTECTOMY WITH INTRAOPERATIVE CHOLANGIOGRAM;  Surgeon: Eric M Wilson, MD,FACS;  Location: [facility];  Service: General;  Laterality: N/A;  Laparoscopic Cholecystectomy with Intraoperative Cholangiogram  . Colonoscopy      FAMILY HISTORY:  Family History  Problem Relation Age of Onset  . Heart disease      5 brothers and 2 sister  . Arthritis Mother   . Diverticulitis Sister     SOCIAL HISTORY:  History  Substance Use Topics  . Smoking status: Former Smoker -- 0.50 packs/day for 40 years    Types: Cigarettes    Quit date: 02/12/2013  . Smokeless tobacco: Never Used  . Alcohol Use: No    ALLERGIES: Latex; Cefaclor; Metronidazole; Naproxen; Nsaids; Penicillins; Septra; and Sulfonamide derivatives  MEDICATIONS:  Current Outpatient Prescriptions  Medication Sig Dispense Refill  . escitalopram (LEXAPRO) 20 MG tablet Take 20 mg by mouth daily.      . esomeprazole (NEXIUM) 40 MG capsule Take 40 mg by mouth daily at 12 noon.      . losartan (COZAAR) 50 MG tablet TAKE 1 TABLET (50 MG TOTAL) BY MOUTH DAILY.  90 tablet  3  . Multiple Vitamin (MULTIVITAMIN) tablet Take 1 tablet by mouth daily.      . oxyCODONE (OXY IR/ROXICODONE) 5 MG immediate release tablet Take 1-2 tablets (  5-10 mg total) by mouth every 4 (four) hours as needed for moderate pain, severe pain or breakthrough pain.  40 tablet  0  . aspirin 81 MG tablet Take 81 mg by mouth daily.      . Calcium-Vitamin D-Vitamin K (CALCIUM SOFT CHEWS PO) Take 1 each by mouth daily. Takes 1-2 daily when remembers      . Cholecalciferol (VITAMIN D3) 5000 UNITS CAPS Take 5,000 Units by mouth daily.      . Coenzyme Q10 (CO Q 10) 100 MG CAPS Take by mouth.      . cyanocobalamin 2000 MCG tablet Take 2,000 mcg by  mouth daily. B12 slow release tablet daily      . midazolam (VERSED) 2 MG/2ML SOLN injection Inject 1-2 mLs (1-2 mg total) into the vein as needed for sedation.  1 mL  0  . VITAMIN E PO Take 3,000 mcg by mouth daily.       No current facility-administered medications for this encounter.    REVIEW OF SYSTEMS:  A 15 point review of systems is documented in the electronic medical record. This was obtained by the nursing staff. However, I reviewed this with the patient to discuss relevant findings and make appropriate changes.  Pertinent items are noted in HPI.  PHYSICAL EXAM:  Filed Vitals:   03/04/14 1037  BP: 133/57  Pulse: 65  Temp: 97.9 F (36.6 C)  .172 lb 14.4 oz (78.427 kg). Thin female. Limited range of motion of the right arm. No drains in place. Selling and some pink along her axillary incision. Palpable seroma and pain when examining her right breast. No abnormalities of the left breast or axilla. Alert and oriented. Appears younger than her stated age.   LABORATORY DATA:  Lab Results  Component Value Date   WBC 6.8 02/18/2014   HGB 12.6 02/18/2014   HCT 38.4 02/18/2014   MCV 98.5 02/18/2014   PLT 211 02/18/2014   Lab Results  Component Value Date   NA 143 02/18/2014   K 4.5 02/18/2014   CL 106 02/18/2014   CO2 25 02/18/2014   Lab Results  Component Value Date   ALT 10 02/18/2014   AST 15 02/18/2014   ALKPHOS 73 02/18/2014   BILITOT 0.4 02/18/2014     RADIOGRAPHY: Nm Sentinel Node Inj-no Rpt (breast)  02/19/2014   CLINICAL DATA: invasive right breast cancer   Sulfur colloid was injected intradermally by the nuclear medicine  technologist for breast cancer sentinel node localization.    Mm Breast Surgical Specimen  02/19/2014   CLINICAL DATA:  Status post lumpectomy  EXAM: SPECIMEN RADIOGRAPH OF THE RIGHT BREAST  COMPARISON:  Previous exam(s)  FINDINGS: Status post excision of the right breast. The radioactive seed and biopsy marker clip are present, completely intact, and are  marked for pathology.  IMPRESSION: Specimen radiograph of the right breast.   Electronically Signed   By: Raymond  Rubner M.D.   On: 02/19/2014 12:51   Mm Rt Plc Breast Loc Dev   1st Lesion  Inc Mammo Guide  02/18/2014   CLINICAL DATA:  68-year-old female with grade 1 invasive ductal carcinoma in the right breast. Preoperative radioactive seed localization.  EXAM: MAMMOGRAPHIC GUIDED RADIOACTIVE SEED LOCALIZATION OF THE RIGHT BREAST  COMPARISON:  Previous exam(s)  FINDINGS: Patient presents for radioactive seed localization prior to right breast partial mastectomy. I met with the patient and we discussed the procedure of seed localization including benefits and alternatives. We discussed the high likelihood   of a successful procedure. We discussed the risks of the procedure including infection, bleeding, tissue injury and further surgery. We discussed the low dose of radioactivity involved in the procedure. Informed, written consent was given.  The usual time-out protocol was performed immediately prior to the procedure.  Using mammographic guidance, sterile technique, 2% lidocaine and an I-125 radioactive seed, the heart shaped biopsy marking clip in the right breast at 10 o'clock was localized using a lateral approach. The follow-up mammogram images confirm the seed in the expected location and are marked for Dr. Rosenbower.  Follow-up survey of the patient confirms presence of radioactive seed.  Order number of I-125 seed:  [id number].  Dose of I-125 seed:  0.229 mCi.  The patient tolerated the procedure well and was released from the [facility]. She was given instructions regarding seed removal.  IMPRESSION: Radioactive seed localization right breast. No apparent complications.   Electronically Signed   By: Jennifer  Jarosz M.D.   On: 02/18/2014 09:54      IMPRESSION: T1N0 Invasive Ductal Carcinoma of the right breast  PLAN: I spoke to the patient today regarding her diagnosis and options for treatment.  We discussed the equivalence in terms of survival and local failure between mastectomy and breast conservation. We discussed the role of radiation in decreasing local failures in patients who undergo lumpectomy. We discussed the process of simulation and the placement tattoos. We discussed 4-6 weeks of treatment as an outpatient. We discussed the possibility of asymptomatic lung damage. We discussed the low likelihood of secondary malignancies. We discussed the possible side effects including but not limited to skin redness, fatigue, permanent skin darkening, and breast swelling. We discussed the process of simulation and the placement of tattoos. I will see her back after her Oncotype score. I did clarify with her that if she needed chemotherapy this would be performed prior to radiation.  We will get her an appointment with Dr. Magrinat whom she has requested.   She will follow up with PT for help mobilizing her arm.   She will let me know what she thinks about radiation here vs. [facility]. We can certainly make that referral and she would be able to care for her grandson if she underwent radiation there.   I spent 40 minutes face to face with the patient and more than 50% of that time was spent in counseling and/or coordination of care. ------------------------------------------------  Stacy Wentworth, MD  

## 2014-03-05 NOTE — Telephone Encounter (Signed)
Called pt and confirmed 03/22/14 appt w/ her.  Changed est appt to NP appt.  Mailed before appt letter, welcoming packet & intake form to pt.  Emailed Anderson Malta, Jearld Fenton, Drs. Rosenbower and Pablo Ledger to make them aware.

## 2014-03-08 ENCOUNTER — Ambulatory Visit: Payer: Medicare Other | Attending: General Surgery | Admitting: Physical Therapy

## 2014-03-08 DIAGNOSIS — IMO0001 Reserved for inherently not codable concepts without codable children: Secondary | ICD-10-CM | POA: Diagnosis not present

## 2014-03-08 DIAGNOSIS — I890 Lymphedema, not elsewhere classified: Secondary | ICD-10-CM | POA: Diagnosis not present

## 2014-03-10 DIAGNOSIS — C50919 Malignant neoplasm of unspecified site of unspecified female breast: Secondary | ICD-10-CM | POA: Diagnosis not present

## 2014-03-11 ENCOUNTER — Encounter (HOSPITAL_COMMUNITY): Payer: Self-pay

## 2014-03-15 ENCOUNTER — Encounter: Payer: Self-pay | Admitting: *Deleted

## 2014-03-15 ENCOUNTER — Ambulatory Visit: Payer: Medicare Other | Attending: General Surgery | Admitting: Physical Therapy

## 2014-03-15 DIAGNOSIS — I890 Lymphedema, not elsewhere classified: Secondary | ICD-10-CM | POA: Diagnosis not present

## 2014-03-15 DIAGNOSIS — Z5189 Encounter for other specified aftercare: Secondary | ICD-10-CM | POA: Insufficient documentation

## 2014-03-15 NOTE — Progress Notes (Signed)
Received Oncotype Dx results of 18.  Faxed a copy to Dr. Pablo Ledger and took a copy to HIM to scan.

## 2014-03-17 ENCOUNTER — Other Ambulatory Visit: Payer: Self-pay | Admitting: Oncology

## 2014-03-18 ENCOUNTER — Ambulatory Visit: Payer: Medicare Other | Admitting: Physical Therapy

## 2014-03-18 DIAGNOSIS — Z5189 Encounter for other specified aftercare: Secondary | ICD-10-CM | POA: Diagnosis not present

## 2014-03-19 ENCOUNTER — Other Ambulatory Visit: Payer: Self-pay | Admitting: *Deleted

## 2014-03-19 DIAGNOSIS — C50919 Malignant neoplasm of unspecified site of unspecified female breast: Secondary | ICD-10-CM

## 2014-03-22 ENCOUNTER — Other Ambulatory Visit (HOSPITAL_BASED_OUTPATIENT_CLINIC_OR_DEPARTMENT_OTHER): Payer: Medicare Other

## 2014-03-22 ENCOUNTER — Ambulatory Visit (HOSPITAL_BASED_OUTPATIENT_CLINIC_OR_DEPARTMENT_OTHER): Payer: Medicare Other | Admitting: Oncology

## 2014-03-22 ENCOUNTER — Ambulatory Visit: Payer: Medicare Other

## 2014-03-22 ENCOUNTER — Encounter: Payer: Self-pay | Admitting: Oncology

## 2014-03-22 ENCOUNTER — Ambulatory Visit: Payer: Medicare Other | Admitting: Physical Therapy

## 2014-03-22 VITALS — BP 131/43 | HR 66 | Temp 98.6°F | Resp 20 | Ht 61.0 in | Wt 167.2 lb

## 2014-03-22 DIAGNOSIS — C50919 Malignant neoplasm of unspecified site of unspecified female breast: Secondary | ICD-10-CM

## 2014-03-22 DIAGNOSIS — C50411 Malignant neoplasm of upper-outer quadrant of right female breast: Secondary | ICD-10-CM

## 2014-03-22 DIAGNOSIS — Z170 Estrogen receptor positive status [ER+]: Secondary | ICD-10-CM

## 2014-03-22 DIAGNOSIS — Z5189 Encounter for other specified aftercare: Secondary | ICD-10-CM | POA: Diagnosis not present

## 2014-03-22 DIAGNOSIS — C50911 Malignant neoplasm of unspecified site of right female breast: Secondary | ICD-10-CM

## 2014-03-22 LAB — COMPREHENSIVE METABOLIC PANEL (CC13)
ALT: 12 U/L (ref 0–55)
AST: 15 U/L (ref 5–34)
Albumin: 3.5 g/dL (ref 3.5–5.0)
Alkaline Phosphatase: 79 U/L (ref 40–150)
Anion Gap: 9 mEq/L (ref 3–11)
BUN: 28 mg/dL — ABNORMAL HIGH (ref 7.0–26.0)
CO2: 24 mEq/L (ref 22–29)
Calcium: 9.1 mg/dL (ref 8.4–10.4)
Chloride: 107 mEq/L (ref 98–109)
Creatinine: 1.1 mg/dL (ref 0.6–1.1)
Glucose: 91 mg/dl (ref 70–140)
Potassium: 4.2 mEq/L (ref 3.5–5.1)
Sodium: 140 mEq/L (ref 136–145)
Total Bilirubin: 0.33 mg/dL (ref 0.20–1.20)
Total Protein: 6.8 g/dL (ref 6.4–8.3)

## 2014-03-22 LAB — CBC WITH DIFFERENTIAL/PLATELET
BASO%: 0.6 % (ref 0.0–2.0)
Basophils Absolute: 0.1 10*3/uL (ref 0.0–0.1)
EOS%: 2 % (ref 0.0–7.0)
Eosinophils Absolute: 0.2 10*3/uL (ref 0.0–0.5)
HCT: 37.6 % (ref 34.8–46.6)
HGB: 12.1 g/dL (ref 11.6–15.9)
LYMPH%: 35.5 % (ref 14.0–49.7)
MCH: 31.1 pg (ref 25.1–34.0)
MCHC: 32.2 g/dL (ref 31.5–36.0)
MCV: 96.8 fL (ref 79.5–101.0)
MONO#: 0.6 10*3/uL (ref 0.1–0.9)
MONO%: 6.2 % (ref 0.0–14.0)
NEUT#: 5.2 10*3/uL (ref 1.5–6.5)
NEUT%: 55.7 % (ref 38.4–76.8)
Platelets: 221 10*3/uL (ref 145–400)
RBC: 3.89 10*6/uL (ref 3.70–5.45)
RDW: 14 % (ref 11.2–14.5)
WBC: 9.3 10*3/uL (ref 3.9–10.3)
lymph#: 3.3 10*3/uL (ref 0.9–3.3)

## 2014-03-22 NOTE — Progress Notes (Signed)
[place]  [phone] Fax:([phone]     ID: Joice Nazario Lasater DOB: [birth year]  MR#: [id number]  NIO#:[id number]  Patient Care Team: Susy Frizzle, MD as PCP - General (Family Medicine) Elsie Stain, MD (Pulmonary Disease) Chauncey Cruel, MD as Consulting Physician (Oncology) Thea Silversmith, MD as Consulting Physician (Radiation Oncology) Cyril Mourning, MD as Consulting Physician (Obstetrics and Gynecology) OTHER MD:  CHIEF COMPLAINT: Early stage estrogen receptor positive breast cancer  CURRENT TREATMENT: Awaiting adjuvant radiation and then adjuvant antiestrogen   BREAST CANCER HISTORY: ON underwent routine screening mammography August of 2015 showing a possible mass in the right breast. On 01/19/2014 at the breast center she underwent right diagnostic mammography and ultrasonography. The breast density was category B. The mass appeared spiculated on spot compression views and an ultrasound it was nearly isoechoic. It was located at the 10:00 position 4 cm from the nipple and measured 7.5 mm by ultrasonography. Ultrasound of the right axilla was negative.  On 01/26/2014 the patient underwent biopsy of the mass in question and this showed ([id number]) and invasive ductal carcinoma, grade 1, estrogen receptor 100% positive, progesterone receptor 60% positive, both with strong staining intensity, with an MIB-1 of 12%, and no HER-2 modification, the signals ratio being 1.38 and the number per cell 3.30.  On 02/19/2014 the patient underwent right lumpectomy. Sentinel lymph node sampling was attempted but failed and accordingly the patient received a full axillary lymph node dissection. The final pathology from this procedure (SZ [id number]. [id number]) showed an invasive ductal carcinoma, 1.4 cm, grade 1, with negative margins, and all 19 lymph nodes clear.  Her subsequent history is as detailed below  INTERVAL HISTORY: Sommer was evaluated in the breast  clinic 03/22/2014 accompanied by her husband Otis. Before her visit here an Oncotype DX was requested. He showed a recurrent score of 18 predicting an 11% risk of outside the breast recurrence if the patient's only systemic treatment was tamoxifen for 5 years  REVIEW OF SYSTEMS: There were no specific symptoms leading to the original mammogram, which was routinely scheduled. Chondra did well with her surgery, without unusual pain, fever, or bleeding. She still has some soreness of course. Otherwise she denies unusual headaches, visual changes, nausea, vomiting, stiff neck, dizziness, or gait imbalance. There has been no cough, phlegm production, or pleurisy, no chest pain or pressure, and no change in bowel or bladder habits. The patient denies pruritus, rash, unexplained fatigue or unexplained weight loss. She does not exercise regularly. A detailed review of systems was otherwise entirely negative.  PAST MEDICAL HISTORY: Past Medical History  Diagnosis Date  . Hyperlipidemia   . Hypertension   . Depression   . Tobacco user   . History of skin cancer   . Allergic rhinitis   . Cancer   . CAD (coronary artery disease)   . Emphysema     no longer uses inhalers  . Shortness of breath     sob if walks up flight of stairs  . Gallstones     nausea, pain upper right abdomen  . H/O hiatal hernia   . GERD (gastroesophageal reflux disease)   . Complication of anesthesia     sore throat after a surgery  . Asymptomatic bilateral carotid artery stenosis   . Wears dentures     top    PAST SURGICAL HISTORY: Past Surgical History  Procedure Laterality Date  . Tubal ligation    . Tonsilectomy, adenoidectomy, bilateral myringotomy  and tubes    . Cholecystectomy  07/08/2012    Procedure: LAPAROSCOPIC CHOLECYSTECTOMY WITH INTRAOPERATIVE CHOLANGIOGRAM;  Surgeon: Gayland Curry, MD,FACS;  Location: [facility];  Service: General;  Laterality: N/A;  Laparoscopic Cholecystectomy with Intraoperative  Cholangiogram  . Colonoscopy      FAMILY HISTORY Family History  Problem Relation Age of Onset  . Heart disease      5 brothers and 2 sister  . Arthritis Mother   . Diverticulitis Sister    the patient's father died at the age of 54. The patient's mother died at the age of 93 from pancreatic cancer which had been diagnosed a year earlier. The patient had 6 brothers, 2 sisters. There is no history of breast or ovarian cancer in the family.  GYNECOLOGIC HISTORY:  No LMP recorded. Patient is postmenopausal. Menarche age 10, first live birth age 68. The patient is GX P1. She went through menopause at approximately age 68. She did not take hormone replacement.   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 She has a son from her first marriage, [name] "really" [name], who works in [place] as a Dealer. The patient has 2 biological grandchildren, including a 55 year old grandson who is undergoing bone marrow transplant at [facility] for aplastic anemia 03/25/2014. Kabao's current, second husband, Dorena Bodo"), worked in Arboriculturist. He is now retired. He also has 2 grandchildren from an earlier marriage. He is a Airline pilot. The patient attends a local [employer].    ADVANCED DIRECTIVES: Not in place   HEALTH MAINTENANCE: History  Substance Use Topics  . Smoking status: Former Smoker -- 0.50 packs/day for 40 years    Types: Cigarettes    Quit date: 02/12/2013  . Smokeless tobacco: Never Used  . Alcohol Use: No     Colonoscopy:  PAP:  Bone density: At Dr. Christen Butter; osteopenia  Lipid panel:  Allergies  Allergen Reactions  . Latex   . Cefaclor Itching, Swelling and Rash  . Metronidazole Itching, Swelling and Rash  . Naproxen Itching, Swelling and Rash  . Nsaids Itching, Swelling and Rash    Can take advil  . Penicillins Itching, Swelling and Rash  . Septra [Bactrim] Itching, Swelling  and Rash  . Sulfonamide Derivatives Itching, Swelling and Rash    Current Outpatient Prescriptions  Medication Sig Dispense Refill  . aspirin 81 MG tablet Take 81 mg by mouth daily.      . Calcium-Vitamin D-Vitamin K (CALCIUM SOFT CHEWS PO) Take 1 each by mouth daily. Takes 1-2 daily when remembers      . Cholecalciferol (VITAMIN D3) 5000 UNITS CAPS Take 5,000 Units by mouth daily.      . Coenzyme Q10 (CO Q 10) 100 MG CAPS Take by mouth.      . cyanocobalamin 2000 MCG tablet Take 2,000 mcg by mouth daily. B12 slow release tablet daily      . escitalopram (LEXAPRO) 20 MG tablet Take 20 mg by mouth daily.      Marland Kitchen esomeprazole (NEXIUM) 40 MG capsule Take 40 mg by mouth daily at 12 noon.      Marland Kitchen losartan (COZAAR) 50 MG tablet TAKE 1 TABLET (50 MG TOTAL) BY MOUTH DAILY.  90 tablet  3  . Multiple Vitamin (MULTIVITAMIN) tablet Take 1 tablet by mouth daily.      Marland Kitchen oxyCODONE (OXY IR/ROXICODONE) 5 MG immediate release tablet Take 1-2 tablets (5-10 mg total) by  mouth every 4 (four) hours as needed for moderate pain, severe pain or breakthrough pain.  40 tablet  0   No current facility-administered medications for this visit.    OBJECTIVE: Middle-aged white woman who appears stated age 55 Vitals:   03/22/14 1557  BP: 131/43  Pulse: 66  Temp: 98.6 F (37 C)  Resp: 20     Body mass index is 31.61 kg/(m^2).    ECOG FS:1 - Symptomatic but completely ambulatory  Ocular: Sclerae unicteric, pupils equal and round Ear-nose-throat: Oropharynx clear, full upper plate, lower bridge Lymphatic: No cervical or supraclavicular adenopathy Lungs no rales or rhonchi Heart regular rate and rhythm Abd soft, nontender, positive bowel sounds MSK no focal spinal tenderness, no joint edema Neuro: non-focal, well-oriented, appropriate affect Breasts: The right breast is status post recent surgery. The incisions are healing nicely. There is no swelling, erythema, or dehiscence. The right axilla is benign. Left  breast is unremarkable.   LAB RESULTS:  CMP     Component Value Date/Time   NA 140 03/22/2014 1551   NA 143 02/18/2014 1130   K 4.2 03/22/2014 1551   K 4.5 02/18/2014 1130   CL 106 02/18/2014 1130   CO2 24 03/22/2014 1551   CO2 25 02/18/2014 1130   GLUCOSE 91 03/22/2014 1551   GLUCOSE 79 02/18/2014 1130   BUN 28.0* 03/22/2014 1551   BUN 21 02/18/2014 1130   CREATININE 1.1 03/22/2014 1551   CREATININE 0.98 02/18/2014 1130   CREATININE 0.90 07/09/2013 0948   CALCIUM 9.1 03/22/2014 1551   CALCIUM 8.9 02/18/2014 1130   PROT 6.8 03/22/2014 1551   PROT 6.6 02/18/2014 1130   ALBUMIN 3.5 03/22/2014 1551   ALBUMIN 3.3* 02/18/2014 1130   AST 15 03/22/2014 1551   AST 15 02/18/2014 1130   ALT 12 03/22/2014 1551   ALT 10 02/18/2014 1130   ALKPHOS 79 03/22/2014 1551   ALKPHOS 73 02/18/2014 1130   BILITOT 0.33 03/22/2014 1551   BILITOT 0.4 02/18/2014 1130   GFRNONAA 57* 02/18/2014 1130   GFRAA 66* 02/18/2014 1130    I No results found for this basename: SPEP,  UPEP,   kappa and lambda light chains    Lab Results  Component Value Date   WBC 9.3 03/22/2014   NEUTROABS 5.2 03/22/2014   HGB 12.1 03/22/2014   HCT 37.6 03/22/2014   MCV 96.8 03/22/2014   PLT 221 03/22/2014      Chemistry      Component Value Date/Time   NA 140 03/22/2014 1551   NA 143 02/18/2014 1130   K 4.2 03/22/2014 1551   K 4.5 02/18/2014 1130   CL 106 02/18/2014 1130   CO2 24 03/22/2014 1551   CO2 25 02/18/2014 1130   BUN 28.0* 03/22/2014 1551   BUN 21 02/18/2014 1130   CREATININE 1.1 03/22/2014 1551   CREATININE 0.98 02/18/2014 1130   CREATININE 0.90 07/09/2013 0948      Component Value Date/Time   CALCIUM 9.1 03/22/2014 1551   CALCIUM 8.9 02/18/2014 1130   ALKPHOS 79 03/22/2014 1551   ALKPHOS 73 02/18/2014 1130   AST 15 03/22/2014 1551   AST 15 02/18/2014 1130   ALT 12 03/22/2014 1551   ALT 10 02/18/2014 1130   BILITOT 0.33 03/22/2014 1551   BILITOT 0.4 02/18/2014 1130       No results found for this  basename: LABCA2    No components found with this basename: LABCA125    No results found for  this basename: INR,  in the last 168 hours  Urinalysis No results found for this basename: colorurine,  appearanceur,  labspec,  phurine,  glucoseu,  hgbur,  bilirubinur,  ketonesur,  proteinur,  urobilinogen,  nitrite,  leukocytesur    STUDIES: As discussed above  ASSESSMENT: 68 y.o. [place] woman  Status post right upper outer quadrant lumpectomy and axillary lymph node dissection 02/19/2014 for a pT1c pN0, stage IA invasive ductal carcinoma, grade 1, estrogen and progesterone receptor positive, HER-2 not amplified, with an MIB-1 of 12%  (1) Oncotype score of 18 predicts a risk of outside the breast recurrence of 11% if the patient's only systemic treatment is tamoxifen for 5 years. It also suggests no benefit from chemotherapy  (2) adjuvant radiation pending  (3) antiestrogens to follow radiation  PLAN: We spent the better part of today's hour-long appointment discussing the biology of breast cancer in general, and the specifics of the patient's tumor in particular. Lolita understands that lumpectomy plus radiation is equivalent to mastectomy in terms of overall survival. Accordingly to complete the local treatment for her breast cancer she will need radiation therapy.  Of the 3 options for systemic treatment, namely an anti-estrogens, anti-HER-2, and chemotherapy, clearly anti-HER-2 therapy would not be helpful. The chemotherapy question was more complex and we discussed that at length today.  In general, Fredrika understands that aggressive estrogen receptor-negative tumors are the ones of benefit the most from chemotherapy. By contrast, luminal -A like tumors tend to do well with chemotherapy and in fact tend to give her a little risk reduction from it. To help [place] with this decision we send an Oncotype. Her score falls right at the break line between the low risk an intermediate risk  category.  One way to retest is that she would get no benefit from chemotherapy, but since we could just as valid Truman Hayward read it as falling in the intermediate risk category, we'll so discuss the fact that in general chemotherapy lowers risk by one third. Her actual risk of recurrence, assuming she takes 5 years of another aromatase inhibitor for example will be closer to 8% and 11%. Accordingly her benefit from chemotherapy would be, if she received for example cyclophosphamide, doxorubicin and paclitaxel, there is reduction of about 3%.  What this means in concrete terms is a fat 100 women like her at all 100 received chemotherapy, 97 would get only side effects and cost. Only 3 would benefit.  Loida is a very good understanding of these numbers. She is very comfortable with foregoing the chemotherapy option, and I am in favor of the decision as well.  Accordingly her next step is radiation. She will need to help care for her grandson who is undergoing bone marrow transplantation at [facility] later this week period and likely her radiation will not start until mid-November. Accordingly I will see her again sometime in January, when she has finished and recovered from her radiation treatments, and we can discuss aromatase inhibitors at that time.    Tempest  has a good understanding of the overall plan. She agrees with it. She knows the goal of treatment in her case is cure. She will call with any problems that may develop before her next visit here.  Chauncey Cruel, MD   03/22/2014 5:31 PM

## 2014-03-22 NOTE — Progress Notes (Signed)
Checked in new patient with no financial issues prior to seeing the dr. She has appt card and has not been out of the country. °

## 2014-03-23 ENCOUNTER — Telehealth: Payer: Self-pay | Admitting: Oncology

## 2014-03-23 NOTE — Telephone Encounter (Signed)
sw pt regarding to 11.4 appt with Dr. Pablo Ledger @ 9am...also advised on Jan 2016 appt...pt ok and aware

## 2014-03-25 ENCOUNTER — Ambulatory Visit: Payer: Medicare Other | Admitting: Physical Therapy

## 2014-03-25 DIAGNOSIS — Z5189 Encounter for other specified aftercare: Secondary | ICD-10-CM | POA: Diagnosis not present

## 2014-03-29 ENCOUNTER — Ambulatory Visit: Payer: Medicare Other | Admitting: Physical Therapy

## 2014-03-29 DIAGNOSIS — Z5189 Encounter for other specified aftercare: Secondary | ICD-10-CM | POA: Diagnosis not present

## 2014-03-31 ENCOUNTER — Ambulatory Visit (INDEPENDENT_AMBULATORY_CARE_PROVIDER_SITE_OTHER): Payer: Medicare Other | Admitting: Physician Assistant

## 2014-03-31 ENCOUNTER — Encounter: Payer: Self-pay | Admitting: Physician Assistant

## 2014-03-31 VITALS — BP 124/70 | HR 64 | Temp 98.6°F | Resp 18 | Wt 170.0 lb

## 2014-03-31 DIAGNOSIS — J320 Chronic maxillary sinusitis: Secondary | ICD-10-CM

## 2014-03-31 DIAGNOSIS — J988 Other specified respiratory disorders: Secondary | ICD-10-CM | POA: Diagnosis not present

## 2014-03-31 DIAGNOSIS — B9689 Other specified bacterial agents as the cause of diseases classified elsewhere: Secondary | ICD-10-CM

## 2014-03-31 MED ORDER — AZITHROMYCIN 250 MG PO TABS: ORAL_TABLET | ORAL | Status: DC

## 2014-03-31 NOTE — Progress Notes (Signed)
Patient ID: Brittany Kirk MRN: [id number], DOB: [birth year], 68 y.o. Date of Encounter: 03/31/2014, 11:36 AM    Chief Complaint:  Chief Complaint  Patient presents with  . sick x 3 days    rt earache, congestion, swollen glands, facial pain     HPI: 68 y.o. year old white female the above complaints. Also says that she's having a lot of drainage down her throat and she is concerned because usually when that happens it goes into bronchitis. Says that she has had some low-grade fever. Says that when she is blowing out of her nose is thick yellow mucus. So far she does not feel any chest congestion or deep cough.     Home Meds:   Outpatient Prescriptions Prior to Visit  Medication Sig Dispense Refill  . aspirin 81 MG tablet Take 81 mg by mouth daily.      . Calcium-Vitamin D-Vitamin K (CALCIUM SOFT CHEWS PO) Take 1 each by mouth daily. Takes 1-2 daily when remembers      . Cholecalciferol (VITAMIN D3) 5000 UNITS CAPS Take 5,000 Units by mouth daily.      . Coenzyme Q10 (CO Q 10) 100 MG CAPS Take by mouth.      . cyanocobalamin 2000 MCG tablet Take 2,000 mcg by mouth daily. B12 slow release tablet daily      . escitalopram (LEXAPRO) 20 MG tablet Take 20 mg by mouth daily.      Marland Kitchen esomeprazole (NEXIUM) 40 MG capsule Take 40 mg by mouth daily at 12 noon.      Marland Kitchen losartan (COZAAR) 50 MG tablet TAKE 1 TABLET (50 MG TOTAL) BY MOUTH DAILY.  90 tablet  3  . Multiple Vitamin (MULTIVITAMIN) tablet Take 1 tablet by mouth daily.      Marland Kitchen oxyCODONE (OXY IR/ROXICODONE) 5 MG immediate release tablet Take 1-2 tablets (5-10 mg total) by mouth every 4 (four) hours as needed for moderate pain, severe pain or breakthrough pain.  40 tablet  0   No facility-administered medications prior to visit.    Allergies:  Allergies  Allergen Reactions  . Latex   . Cefaclor Itching, Swelling and Rash  . Metronidazole Itching, Swelling and Rash  . Naproxen Itching, Swelling and Rash  . Nsaids Itching, Swelling  and Rash    Can take advil  . Penicillins Itching, Swelling and Rash  . Septra [Bactrim] Itching, Swelling and Rash  . Sulfonamide Derivatives Itching, Swelling and Rash      Review of Systems: See HPI for pertinent ROS. All other ROS negative.    Physical Exam: Blood pressure 124/70, pulse 64, temperature 98.6 F (37 C), temperature source Oral, resp. rate 18, weight 170 lb (77.111 kg)., Body mass index is 32.14 kg/(m^2). General: WNWD WF.  Appears in no acute distress. HEENT: Normocephalic, atraumatic, eyes without discharge, sclera non-icteric. Bilateral auditory canals clear. Bilateral TMs have a lot of white scar. Right TM has a small perforation which patient states is chronic.  Oral cavity moist, posterior pharynx without exudate, erythema, peritonsillar abscess.  Bilateral maxillary sinuses are very tender with percussion. Minimal tenderness with percussion of frontal sinuses. Neck: Supple. No thyromegaly. She reports positive tenderness with palpation of right cervical lymph nodes. However I palpate no enlarged nodes. Lungs: Clear bilaterally to auscultation without wheezes, rales, or rhonchi. Breathing is unlabored. Heart: Regular rhythm. No murmurs, rubs, or gallops. Msk:  Strength and tone normal for age. Extremities/Skin: Warm and dry. Neuro: Alert and oriented X 3.  Moves all extremities spontaneously. Gait is normal. CNII-XII grossly in tact. Psych:  Responds to questions appropriately with a normal affect.     ASSESSMENT AND PLAN:  68 y.o. year old female with  1. Maxillary sinusitis, unspecified chronicity She has penicillin allergy and cannot take Augmentin either. Also sulfa allergy. She states that she has taken azithromycin. As well I gave her some samples of Norel AD. I reviewed her blood pressure is well controlled to this should be okay. She is to take these as directed as needed for help with congestion. She is to follow up if symptoms worsen significantly or  do not resolve within one week after completion of antibiotic. - azithromycin (ZITHROMAX) 250 MG tablet; Day 1: Take 2 daily.  Days 2-5: Take 1 daily.  Dispense: 6 tablet; Refill: 0  2. Bacterial respiratory infection - azithromycin (ZITHROMAX) 250 MG tablet; Day 1: Take 2 daily.  Days 2-5: Take 1 daily.  Dispense: 6 tablet; Refill: 0   Signed, [address] [place], [place], [facility] 03/31/2014 11:36 AM

## 2014-04-01 ENCOUNTER — Ambulatory Visit: Payer: Medicare Other | Admitting: Physical Therapy

## 2014-04-05 ENCOUNTER — Ambulatory Visit: Payer: Medicare Other | Admitting: Physical Therapy

## 2014-04-05 DIAGNOSIS — Z5189 Encounter for other specified aftercare: Secondary | ICD-10-CM | POA: Diagnosis not present

## 2014-04-08 ENCOUNTER — Ambulatory Visit: Payer: Medicare Other | Admitting: Physical Therapy

## 2014-04-08 DIAGNOSIS — Z5189 Encounter for other specified aftercare: Secondary | ICD-10-CM | POA: Diagnosis not present

## 2014-04-12 ENCOUNTER — Ambulatory Visit: Payer: Medicare Other | Attending: General Surgery | Admitting: Physical Therapy

## 2014-04-12 ENCOUNTER — Encounter: Payer: Self-pay | Admitting: Physical Therapy

## 2014-04-12 DIAGNOSIS — R29898 Other symptoms and signs involving the musculoskeletal system: Secondary | ICD-10-CM

## 2014-04-12 DIAGNOSIS — I890 Lymphedema, not elsewhere classified: Secondary | ICD-10-CM | POA: Insufficient documentation

## 2014-04-12 DIAGNOSIS — Z5189 Encounter for other specified aftercare: Secondary | ICD-10-CM | POA: Insufficient documentation

## 2014-04-12 NOTE — Therapy (Signed)
Physical Therapy Treatment  Patient Details  Name: Brittany Kirk MRN: [id number] Date of Birth: 02-13-1946  Encounter Date: 05-08-14      PT End of Session - 05-08-14 1539    Visit Number 9   PT Start Time 0867   PT Stop Time 1515   PT Time Calculation (min) 40 min   Activity Tolerance Patient tolerated treatment well      Past Medical History  Diagnosis Date  . Hyperlipidemia   . Hypertension   . Depression   . Tobacco user   . History of skin cancer   . Allergic rhinitis   . Cancer   . CAD (coronary artery disease)   . Emphysema     no longer uses inhalers  . Shortness of breath     sob if walks up flight of stairs  . Gallstones     nausea, pain upper right abdomen  . H/O hiatal hernia   . GERD (gastroesophageal reflux disease)   . Complication of anesthesia     sore throat after a surgery  . Asymptomatic bilateral carotid artery stenosis   . Wears dentures     top    Past Surgical History  Procedure Laterality Date  . Tubal ligation    . Tonsilectomy, adenoidectomy, bilateral myringotomy and tubes    . Cholecystectomy  07/08/2012    Procedure: LAPAROSCOPIC CHOLECYSTECTOMY WITH INTRAOPERATIVE CHOLANGIOGRAM;  Surgeon: Gayland Curry, MD,FACS;  Location: [facility];  Service: General;  Laterality: N/A;  Laparoscopic Cholecystectomy with Intraoperative Cholangiogram  . Colonoscopy      There were no vitals taken for this visit.  Visit Diagnosis:  Lymphedema  Shoulder weakness        [facility] PT Assessment - May 08, 2014 1700    Precautions Other (comment)  cancer precautions          Adult PT Treatment/Exercise - 2014/05/08 0700    Other Standing Exercises Strength ABC packet    reviewed, demonstrated and practiced all stretches and exercises.  Pt acknowledged the concept of lypmphatic  load and monitoring her arm/chest/back for signs of increased swelling.      Education - 2014-05-08 1539    Education provided Yes   Education Details Patient   Methods  Explanation;Demonstration;Verbal cues;Handout   Comprehension Returned demonstration;Verbalized understanding              Plan - 2014/05/08 1541    Clinical Impression Statement Edema is under control , ROM is improved and [name] has shown a good understanding of strength porgram.  Her husband has exerperience with exercise training and will be able to assist in home exercise program.  She feels that she is ready to discontinue coming to PT.    PT Frequency Min 2X/week   PT Duration 4 weeks   PT Treatment/Interventions Therapeutic exercise;Patient/family education;Manual techniques          G-Codes - 08-May-2014 1555    Functional Assessment Tool Used clinical judgement   Functional Limitation Other PT primary   Other PT Primary Goal Status (Y1950) At least 20 percent but less than 40 percent impaired, limited or restricted   Other PT Primary Discharge Status (D3267) At least 20 percent but less than 40 percent impaired, limited or restricted      Problem List Patient Active Problem List   Diagnosis Date Noted  . Breast cancer of upper-outer quadrant of right female breast 03/22/2014  . Breast cancer-right s/p right partial mastectomy and right axillary LND 02/19/14 02/03/2014  .  Dizziness and giddiness 11/27/2012  . Essential tremor 11/27/2012  . Numbness 11/27/2012  . TOBACCO USER 10/04/2009  . CLOSED FRACTURE OF ONE RIB 07/12/2009  . HYPERLIPIDEMIA TYPE IIB / III 09/28/2008  . CAD, NATIVE VESSEL 09/28/2008  . ALLERGIC RHINITIS 09/09/2008  . EMPHYSEMA 09/07/2008  . HYPERTENSION 09/06/2008  . SKIN CANCER, HX OF 09/06/2008            LYMPHEDEMA/ONCOLOGY QUESTIONNAIRE - 04/12/14 1500    10 cm Proximal to Olecranon Process 34.2  12 cm proximal to olecranon   Olecranon Process 27.5   10 cm Proximal to Ulnar Styloid Process 26.2  12 cm proximal to ulnar styloid   Just Proximal to Ulnar Styloid Process 17   Across Hand at [employer] 17.5   At [place] of 2nd  Digit 6.0            Long Term Clinic Goals - 04/12/14 1550    Title verbalize good understanding of the maintenance phase of treatment including manual lymph drainage use of compression and lymphedema risk reduction practices   Time 4   Period Weeks   Status Achieved   Title reduce edema by 1.0 cm at 12cm proximal to the ulnar styloid   Time 4   Period Weeks   Status Partially Met   Title be independent with a home exercise program   Time 4   Period Weeks   Status Achieved   Title report overall pain decreased >/= 50 % to tolerate daily tasks with less pain   Time 4   Period Weeks   Status Achieved   Title increase right shoulder flexion range of motion to >/= 130 degrees fo rincreased functional use of arm   Time 4   Period Weeks   Status Achieved   Title increase right shoulder abduction range of motion to >/= 130 degrees for increased functional use of arm   Time 4   Period Weeks   Status Achieved   Additional Goals Yes     PHYSICAL THERAPY DISCHARGE SUMMARY  Visits from Start of Care: 9  Current functional level related to goals / functional outcomes: Brittany Kirk is independent in self management of her edema, compression  Garments and knows how to progress her exercise porgram   Remaining deficits: Some edema in right forearm   Education / Equipment: Self manual lymph drainage, progressive exercise program. Plan: Patient agrees to discharge.  Patient goals were partially met. Patient is being discharged due to not returning since the last visit.  ?she feels she continue management on her own at home???         [name] 04/12/2014, 6:00 PM

## 2014-04-12 NOTE — Patient Instructions (Signed)
Strength ABC packet and exercise log.  Reviewed link to [email or web address]  Lymphedema education session that patient already has.  Reinforced that need to start low and progress slow and closely monitor right arm response to increase to weight training.

## 2014-04-14 ENCOUNTER — Ambulatory Visit
Admission: RE | Admit: 2014-04-14 | Discharge: 2014-04-14 | Disposition: A | Discharge status: Home / Self Care | Payer: Medicare Other | Source: Ambulatory Visit | Attending: Radiation Oncology | Admitting: Radiation Oncology

## 2014-04-14 ENCOUNTER — Encounter: Payer: Self-pay | Admitting: Radiation Oncology

## 2014-04-14 VITALS — BP 134/64 | HR 61 | Temp 98.4°F | Resp 16 | Wt 167.9 lb

## 2014-04-14 DIAGNOSIS — C50411 Malignant neoplasm of upper-outer quadrant of right female breast: Secondary | ICD-10-CM | POA: Insufficient documentation

## 2014-04-14 DIAGNOSIS — C50419 Malignant neoplasm of upper-outer quadrant of unspecified female breast: Secondary | ICD-10-CM | POA: Diagnosis not present

## 2014-04-14 DIAGNOSIS — Z510 Encounter for antineoplastic radiation therapy: Secondary | ICD-10-CM | POA: Diagnosis not present

## 2014-04-14 NOTE — Progress Notes (Signed)
Please see the Nurse Progress Note in the MD Initial Consult Encounter for this patient. 

## 2014-04-14 NOTE — Progress Notes (Signed)
Patient here as [facility] to review oncotype score of 18.Discussed treatment options with Dr.Magrinat and will forego chemotherapy and just do radiation and anti-estrogen therapy.

## 2014-04-15 ENCOUNTER — Encounter: Payer: Self-pay | Admitting: Radiation Oncology

## 2014-04-15 NOTE — Progress Notes (Signed)
   Department of Radiation Oncology  Phone:  [phone] Fax:        [phone]   Name: ZAIYAH SOTTILE MRN: [id number]  DOB: Apr 04, 1946  Date: 04/14/2014  Follow Up Visit Note  Diagnosis: Breast cancer-right s/p right partial mastectomy and right axillary LND 02/19/14   Staging form: Breast, AJCC 7th Edition     Clinical: Stage IA (T1c, N0, cM0) - Signed by Chauncey Cruel, MD on 03/22/2014  Interval History: Leoma presents today for routine followup.  Her grandson is doing well and will be released December 31. She would like to start radiation after that.   Physical Exam:  Filed Vitals:   04/14/14 0910  BP: 134/64  Pulse: 61  Temp: 98.4 F (36.9 C)  TempSrc: Oral  Resp: 16  Weight: 167 lb 14.4 oz (76.159 kg)   Pleasant. No distress.  IMPRESSION: Xoey is a 68 y.o. female s/p lumpectomy  PLAN:  I spoke to the patient today regarding her diagnosis and options for treatment.  We discussed the role of radiation in decreasing local failures in patients who undergo lumpectomy. We discussed the process of simulation and the placement tattoos. We discussed 4 weeks of treatment as an outpatient. We discussed the possibility of asymptomatic lung damage. We discussed the low likelihood of secondary malignancies. We discussed the possible side effects including but not limited to skin redness, fatigue, permanent skin darkening, and breast swelling.   We discussed the randomized trials looking at elderly women and the lack of survival benefit between AI vs. RT plus AI. She would like to take an antiestrogen for 3 months while her grandson is recovering. If she tolerates that well, she will likely continue on antiestrogen treatment. If she decides she would like radiation, she will call me in December.  Her last bone density was 2 years ago at [facility]. I will contact Dr. Virgie Dad office and ask him to call in a prescription for antiestrogen.     Thea Silversmith, MD

## 2014-04-18 ENCOUNTER — Other Ambulatory Visit: Payer: Self-pay | Admitting: Oncology

## 2014-04-18 MED ORDER — ANASTROZOLE 1 MG PO TABS: 1.0000 mg | ORAL_TABLET | Freq: Every day | ORAL | Status: DC

## 2014-04-18 NOTE — Progress Notes (Unsigned)
I reviewed Dr. Unknown Jim note, which indicates the patient would prefer to take anti-estrogens and forego radiation as opposed to doing both anti-estrogens and radiation. I contacted Brittany Kirk and she is willing to start anastrozole at present. She has a good understanding of the possible toxicities, side effects and complications and also possible cost issues. She will let me know if any other problems develop before her return visit here in January. At that point she can make a final decision regarding whether or not to pursue radiation.

## 2014-04-21 ENCOUNTER — Encounter (INDEPENDENT_AMBULATORY_CARE_PROVIDER_SITE_OTHER): Payer: Self-pay | Admitting: General Surgery

## 2014-04-21 NOTE — Progress Notes (Signed)
Patient ID: Brittany Kirk, female   DOB: Jul 11, 1945, 68 y.o.   MRN: [id number]  Brittany Kirk 04/21/2014 11:03 AM Location: [facility] Patient #: [id number] DOB: April 04, 1946 Married / Language: English / Race: White Female History of Present Illness Brittany Hollingshead MD; 04/21/2014 11:24 AM) Patient words: post Op br check.  The patient is a 68 year old female    Note:Procedure: 1. Right axillary lymphatic mapping. 2. Injection of blue dye into the right breast. 3. Right partial mastectomy after radioactive seed localization. 4. Right axillary lymph node dissection  Date: 02/19/14  Pathology: T1cN0 ER/PR positive  History: She is here for another postop visit. She has some intermittent incisional soreness. No right arm swelling. She has been started on Arimidex.  Vitals Brittany Kirk CMA; 04/21/2014 11:04 AM) 04/21/2014 11:04 AM Weight: 164 lb Height: 61in Body Surface Area: 1.79 m Body Mass Index: 30.99 kg/m Temp.: 98.41F  Pulse: 60 (Regular)  BP: 120/58 (Sitting, Left Arm, Standard)     Physical Exam Brittany Hollingshead MD; 04/21/2014 11:24 AM)  The physical exam findings are as follows: Note:General- Is in NAD.  Breasts-Right sided wound is clean and intact.  Extr-Right axillary wound is clean and intact with no significant swelling.    Assessment & Plan MALIGNANT NEOPLASM OF UPPER-OUTER QUADRANT OF RIGHT FEMALE BREAST (174.4  C50.411) Impression: Wounds healing well. No lymphedema.  Plan: RTC in 5 months.  Brittany Kirk

## 2014-06-14 ENCOUNTER — Ambulatory Visit (INDEPENDENT_AMBULATORY_CARE_PROVIDER_SITE_OTHER): Payer: Medicare Other | Admitting: *Deleted

## 2014-06-14 DIAGNOSIS — Z23 Encounter for immunization: Secondary | ICD-10-CM | POA: Diagnosis not present

## 2014-06-17 ENCOUNTER — Telehealth: Payer: Self-pay | Admitting: Oncology

## 2014-06-17 ENCOUNTER — Other Ambulatory Visit: Payer: Medicare Other

## 2014-06-17 NOTE — Telephone Encounter (Signed)
lab r/s from 1/7 to 2/29 and fu r/s from 1/14 to 3/7 per pt. pt has new appt d/t.

## 2014-06-24 ENCOUNTER — Ambulatory Visit: Payer: Medicare Other | Admitting: Oncology

## 2014-07-19 ENCOUNTER — Other Ambulatory Visit: Payer: Self-pay | Admitting: Dermatology

## 2014-07-19 DIAGNOSIS — D225 Melanocytic nevi of trunk: Secondary | ICD-10-CM | POA: Diagnosis not present

## 2014-07-19 DIAGNOSIS — D2372 Other benign neoplasm of skin of left lower limb, including hip: Secondary | ICD-10-CM | POA: Diagnosis not present

## 2014-07-19 DIAGNOSIS — D485 Neoplasm of uncertain behavior of skin: Secondary | ICD-10-CM | POA: Diagnosis not present

## 2014-07-19 DIAGNOSIS — C44319 Basal cell carcinoma of skin of other parts of face: Secondary | ICD-10-CM | POA: Diagnosis not present

## 2014-07-19 DIAGNOSIS — L821 Other seborrheic keratosis: Secondary | ICD-10-CM | POA: Diagnosis not present

## 2014-07-19 DIAGNOSIS — D0472 Carcinoma in situ of skin of left lower limb, including hip: Secondary | ICD-10-CM | POA: Diagnosis not present

## 2014-07-19 DIAGNOSIS — D1801 Hemangioma of skin and subcutaneous tissue: Secondary | ICD-10-CM | POA: Diagnosis not present

## 2014-07-19 DIAGNOSIS — L814 Other melanin hyperpigmentation: Secondary | ICD-10-CM | POA: Diagnosis not present

## 2014-07-19 DIAGNOSIS — L720 Epidermal cyst: Secondary | ICD-10-CM | POA: Diagnosis not present

## 2014-07-22 ENCOUNTER — Other Ambulatory Visit: Payer: Self-pay | Admitting: Family Medicine

## 2014-08-02 DIAGNOSIS — R0789 Other chest pain: Secondary | ICD-10-CM | POA: Diagnosis not present

## 2014-08-04 DIAGNOSIS — C44319 Basal cell carcinoma of skin of other parts of face: Secondary | ICD-10-CM | POA: Diagnosis not present

## 2014-08-04 DIAGNOSIS — Z85828 Personal history of other malignant neoplasm of skin: Secondary | ICD-10-CM | POA: Diagnosis not present

## 2014-08-09 ENCOUNTER — Other Ambulatory Visit (HOSPITAL_BASED_OUTPATIENT_CLINIC_OR_DEPARTMENT_OTHER): Payer: Medicare Other

## 2014-08-09 DIAGNOSIS — C50911 Malignant neoplasm of unspecified site of right female breast: Secondary | ICD-10-CM

## 2014-08-09 DIAGNOSIS — C50411 Malignant neoplasm of upper-outer quadrant of right female breast: Secondary | ICD-10-CM | POA: Diagnosis not present

## 2014-08-09 LAB — COMPREHENSIVE METABOLIC PANEL (CC13)
ALT: 10 U/L (ref 0–55)
AST: 13 U/L (ref 5–34)
Albumin: 3.4 g/dL — ABNORMAL LOW (ref 3.5–5.0)
Alkaline Phosphatase: 82 U/L (ref 40–150)
Anion Gap: 7 mEq/L (ref 3–11)
BUN: 30.4 mg/dL — ABNORMAL HIGH (ref 7.0–26.0)
CO2: 26 mEq/L (ref 22–29)
Calcium: 9.1 mg/dL (ref 8.4–10.4)
Chloride: 107 mEq/L (ref 98–109)
Creatinine: 1.1 mg/dL (ref 0.6–1.1)
EGFR: 51 mL/min/{1.73_m2} — ABNORMAL LOW (ref >90)
Glucose: 112 mg/dl (ref 70–140)
Potassium: 4 mEq/L (ref 3.5–5.1)
Sodium: 140 mEq/L (ref 136–145)
Total Bilirubin: 0.39 mg/dL (ref 0.20–1.20)
Total Protein: 6.4 g/dL (ref 6.4–8.3)

## 2014-08-09 LAB — CBC WITH DIFFERENTIAL/PLATELET
BASO%: 0.3 % (ref 0.0–2.0)
Basophils Absolute: 0 10*3/uL (ref 0.0–0.1)
EOS%: 0.6 % (ref 0.0–7.0)
Eosinophils Absolute: 0.1 10*3/uL (ref 0.0–0.5)
HCT: 40.4 % (ref 34.8–46.6)
HGB: 13.4 g/dL (ref 11.6–15.9)
LYMPH%: 31.2 % (ref 14.0–49.7)
MCH: 32.4 pg (ref 25.1–34.0)
MCHC: 33.2 g/dL (ref 31.5–36.0)
MCV: 97.8 fL (ref 79.5–101.0)
MONO#: 0.6 10*3/uL (ref 0.1–0.9)
MONO%: 5.4 % (ref 0.0–14.0)
NEUT#: 6.8 10*3/uL — ABNORMAL HIGH (ref 1.5–6.5)
NEUT%: 62.5 % (ref 38.4–76.8)
Platelets: 290 10*3/uL (ref 145–400)
RBC: 4.13 10*6/uL (ref 3.70–5.45)
RDW: 14.1 % (ref 11.2–14.5)
WBC: 10.9 10*3/uL — ABNORMAL HIGH (ref 3.9–10.3)
lymph#: 3.4 10*3/uL — ABNORMAL HIGH (ref 0.9–3.3)

## 2014-08-11 DIAGNOSIS — Z85828 Personal history of other malignant neoplasm of skin: Secondary | ICD-10-CM | POA: Diagnosis not present

## 2014-08-11 DIAGNOSIS — D0472 Carcinoma in situ of skin of left lower limb, including hip: Secondary | ICD-10-CM | POA: Diagnosis not present

## 2014-08-16 ENCOUNTER — Telehealth: Payer: Self-pay | Admitting: Oncology

## 2014-08-16 ENCOUNTER — Ambulatory Visit (HOSPITAL_BASED_OUTPATIENT_CLINIC_OR_DEPARTMENT_OTHER): Payer: Medicare Other | Admitting: Oncology

## 2014-08-16 VITALS — BP 125/58 | HR 70 | Temp 98.1°F | Resp 18 | Ht 61.0 in | Wt 163.0 lb

## 2014-08-16 DIAGNOSIS — R110 Nausea: Secondary | ICD-10-CM

## 2014-08-16 DIAGNOSIS — C50411 Malignant neoplasm of upper-outer quadrant of right female breast: Secondary | ICD-10-CM

## 2014-08-16 DIAGNOSIS — R200 Anesthesia of skin: Secondary | ICD-10-CM

## 2014-08-16 DIAGNOSIS — N183 Chronic kidney disease, stage 3 unspecified: Secondary | ICD-10-CM

## 2014-08-16 DIAGNOSIS — I129 Hypertensive chronic kidney disease with stage 1 through stage 4 chronic kidney disease, or unspecified chronic kidney disease: Secondary | ICD-10-CM | POA: Insufficient documentation

## 2014-08-16 DIAGNOSIS — E782 Mixed hyperlipidemia: Secondary | ICD-10-CM

## 2014-08-16 DIAGNOSIS — J438 Other emphysema: Secondary | ICD-10-CM

## 2014-08-16 DIAGNOSIS — N182 Chronic kidney disease, stage 2 (mild): Secondary | ICD-10-CM | POA: Insufficient documentation

## 2014-08-16 DIAGNOSIS — Z170 Estrogen receptor positive status [ER+]: Secondary | ICD-10-CM

## 2014-08-16 DIAGNOSIS — R42 Dizziness and giddiness: Secondary | ICD-10-CM

## 2014-08-16 DIAGNOSIS — C50919 Malignant neoplasm of unspecified site of unspecified female breast: Secondary | ICD-10-CM

## 2014-08-16 NOTE — Telephone Encounter (Signed)
appts made and avs printed for pt  Brittany Kirk °

## 2014-08-16 NOTE — Progress Notes (Signed)
[name]  [phone] [phone]     ID: Brittany Kirk DOB: April 11, 1946  MR#: [id number]  ZHY#:[id number]  Patient Care Team: Susy Frizzle, MD as PCP - General (Family Medicine) Elsie Stain, MD (Pulmonary Disease) Chauncey Cruel, MD as Consulting Physician (Oncology) Thea Silversmith, MD as Consulting Physician (Radiation Oncology) Dian Queen, MD as Consulting Physician (Obstetrics and Gynecology) OTHER MD: Griselda Miner M.D.  CHIEF COMPLAINT: Early stage estrogen receptor positive breast cancer  CURRENT TREATMENT: A anastrozole   BREAST CANCER HISTORY: From the earlier intake note:  Mel underwent routine screening mammography August of 2015 showing a possible mass in the right breast. On 01/19/2014 at the breast center she underwent right diagnostic mammography and ultrasonography. The breast density was category B. The mass appeared spiculated on spot compression views and an ultrasound it was nearly isoechoic. It was located at the 10:00 position 4 cm from the nipple and measured 7.5 mm by ultrasonography. Ultrasound of the right axilla was negative.  On 01/26/2014 the patient underwent biopsy of the mass in question and this showed ([id number]) and invasive ductal carcinoma, grade 1, estrogen receptor 100% positive, progesterone receptor 60% positive, both with strong staining intensity, with an MIB-1 of 12%, and no HER-2 modification, the signals ratio being 1.38 and the number per cell 3.30.  On 02/19/2014 the patient underwent right lumpectomy. Sentinel lymph node sampling was attempted but failed and accordingly the patient received a full axillary lymph node dissection. The final pathology from this procedure ([id number]) showed an invasive ductal carcinoma, 1.4 cm, grade 1, with negative margins, and all 19 lymph nodes clear.  Her subsequent history is as detailed below  INTERVAL HISTORY: [name] returns today  for follow-up of her early stage breast cancer. After her last visit with me she met with Dr. Pablo Ledger, who discussed the possible benefits, toxicities and side effects of radiation and also that possibility of avoiding radiation if the patient took anti-estrogens. The patient opted for that option, and started anastrozole September 8, with the understanding that if she did not tolerate it well she would contact Dr. Pablo Ledger and reconsider the radiation decision. However Macon seems to be doing quite well with anastrozole. She has essentially no hot flashes. Vaginal dryness is present but not worse than before. She has not developed the arthralgias/myalgias that some patients can experience on this drug. Incidentally she obtains it "at a cheaper price". Accordingly she feels comfortable with foregoing radiation and with continuing anastrozole for a total of 5 years  Incidentally her grandson made it through his knowledge and a transplant, but a Mustang which she is driving around town, and is heading for [employer] .  REVIEW OF SYSTEMS: [name] tells me she has had problems with headaches nausea and dizziness. The headaches are intermittent. There are not accompanied by visual changes or or [place]. She has had persistent nausea lasting 3-7 days at a time and then clearing. She thought maybe it was not products and eliminated all milk products for about a week, but the problem persisted. She lay vomited one time, after eating out at country kitchen. Recall she is status post cholecystectomy. In addition to this sometimes she will sit in front of her computer and feel quite dizzy. She doesn't have any other visual changes. She has had a couple of squamous cell skin carcinomas removed by Dr. Sarajane Jews. She is generally not short of breath though she does get somewhat short of breath  when she climbs stairs. The pain in her right left rib cage is very focal, at the axillary line beneath the breast. Recall her  surgery was on the right side. A detailed review of systems today was otherwise noncontributory  PAST MEDICAL HISTORY: Past Medical History  Diagnosis Date  . Hyperlipidemia   . Hypertension   . Depression   . Tobacco user   . History of skin cancer   . Allergic rhinitis   . Cancer   . CAD (coronary artery disease)   . Emphysema     no longer uses inhalers  . Shortness of breath     sob if walks up flight of stairs  . Gallstones     nausea, pain upper right abdomen  . H/O hiatal hernia   . GERD (gastroesophageal reflux disease)   . Complication of anesthesia     sore throat after a surgery  . Asymptomatic bilateral carotid artery stenosis   . Wears dentures     top    PAST SURGICAL HISTORY: Past Surgical History  Procedure Laterality Date  . Tubal ligation    . Tonsilectomy, adenoidectomy, bilateral myringotomy and tubes    . Cholecystectomy  07/08/2012    Procedure: LAPAROSCOPIC CHOLECYSTECTOMY WITH INTRAOPERATIVE CHOLANGIOGRAM;  Surgeon: Gayland Curry, MD,FACS;  Location: [facility];  Service: General;  Laterality: N/A;  Laparoscopic Cholecystectomy with Intraoperative Cholangiogram  . Colonoscopy      FAMILY HISTORY Family History  Problem Relation Age of Onset  . Heart disease      5 brothers and 2 sister  . Arthritis Mother   . Diverticulitis Sister    the patient's father died at the age of 52. The patient's mother died at the age of 38 from pancreatic cancer which had been diagnosed a year earlier. The patient had 6 brothers, 2 sisters. There is no history of breast or ovarian cancer in the family.  GYNECOLOGIC HISTORY:  No LMP recorded. Patient is postmenopausal. Menarche age 20, first live birth age 20. The patient is GX P1. She went through menopause at approximately age 69. She did not take hormone replacement.   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 She has a son from her first  marriage, Brittany Kirk "really" Brittany Kirk, who works in [place] as a Dealer. The patient has 2 biological grandchildren, including a 65 year old grandson who is undergoing bone marrow transplant at [facility] for aplastic anemia 03/25/2014. Laquia's current, second husband, Dorena Bodo"), worked in Arboriculturist. He is now retired. He also has 2 grandchildren from an earlier marriage. He is a Airline pilot. The patient attends a local [employer].    ADVANCED DIRECTIVES: Not in place   HEALTH MAINTENANCE: History  Substance Use Topics  . Smoking status: Former Smoker -- 0.50 packs/day for 40 years    Types: Cigarettes    Quit date: 02/12/2013  . Smokeless tobacco: Never Used  . Alcohol Use: No     Colonoscopy:  PAP:  Bone density: At Dr. Christen Butter; osteopenia  Lipid panel:  Allergies  Allergen Reactions  . Latex   . Cefaclor Itching, Swelling and Rash  . Metronidazole Itching, Swelling and Rash  . Naproxen Itching, Swelling and Rash  . Nsaids Itching, Swelling and Rash    Can take advil  . Penicillins Itching, Swelling and Rash  . Septra [Bactrim] Itching, Swelling and Rash  . Sulfonamide Derivatives Itching, Swelling and Rash  Current Outpatient Prescriptions  Medication Sig Dispense Refill  . anastrozole (ARIMIDEX) 1 MG tablet Take 1 tablet (1 mg total) by mouth daily. 90 tablet 4  . aspirin 81 MG tablet Take 81 mg by mouth daily.    . Calcium-Vitamin D-Vitamin K (CALCIUM SOFT CHEWS PO) Take 1 each by mouth daily. Takes 1-2 daily when remembers    . Cholecalciferol (VITAMIN D3) 5000 UNITS CAPS Take 5,000 Units by mouth daily.    . Coenzyme Q10 (CO Q 10) 100 MG CAPS Take by mouth.    . cyanocobalamin 2000 MCG tablet Take 2,000 mcg by mouth daily. B12 slow release tablet daily    . escitalopram (LEXAPRO) 20 MG tablet Take 20 mg by mouth daily.    Marland Kitchen esomeprazole (NEXIUM) 40 MG capsule Take 40 mg by mouth daily at 12 noon.    Marland Kitchen losartan (COZAAR) 50 MG tablet TAKE 1  TABLET EVERY DAY 90 tablet 3  . Multiple Vitamin (MULTIVITAMIN) tablet Take 1 tablet by mouth daily.    Marland Kitchen oxyCODONE (OXY IR/ROXICODONE) 5 MG immediate release tablet Take 1-2 tablets (5-10 mg total) by mouth every 4 (four) hours as needed for moderate pain, severe pain or breakthrough pain. 40 tablet 0   No current facility-administered medications for this visit.    OBJECTIVE: Middle-aged white woman in no acute distress Filed Vitals:   08/16/14 1434  BP: 125/58  Pulse: 70  Temp: 98.1 F (36.7 C)  Resp: 18     Body mass index is 30.81 kg/(m^2).    ECOG FS:1 - Symptomatic but completely ambulatory  Sclerae unicteric, EOMs intact, pupils round and equal Oropharynx clear, full upper plate No cervical or supraclavicular adenopathy Lungs no rales or rhonchi Heart regular rate and rhythm Abd soft, nontender, positive bowel sounds, no masses palpated MSK there is focal tenderness to moderate palpation in the rib cage around the sixth or seventh rib on the left, at the axillary line; there is no focal spinal tenderness and no upper extremity lymphedema Neuro: non-focal, well-oriented, appropriate affect Breasts: The right breast is status post lumpectomy. There is no swelling, erythema, or dehiscence. The right axilla is benign. Left breast is unremarkable. Skin: Bandages right 4 head left calf from recent excisional biopsies  LAB RESULTS:  CMP     Component Value Date/Time   NA 140 08/09/2014 1359   NA 143 02/18/2014 1130   K 4.0 08/09/2014 1359   K 4.5 02/18/2014 1130   CL 106 02/18/2014 1130   CO2 26 08/09/2014 1359   CO2 25 02/18/2014 1130   GLUCOSE 112 08/09/2014 1359   GLUCOSE 79 02/18/2014 1130   BUN 30.4* 08/09/2014 1359   BUN 21 02/18/2014 1130   CREATININE 1.1 08/09/2014 1359   CREATININE 0.98 02/18/2014 1130   CREATININE 0.90 07/09/2013 0948   CALCIUM 9.1 08/09/2014 1359   CALCIUM 8.9 02/18/2014 1130   PROT 6.4 08/09/2014 1359   PROT 6.6 02/18/2014 1130    ALBUMIN 3.4* 08/09/2014 1359   ALBUMIN 3.3* 02/18/2014 1130   AST 13 08/09/2014 1359   AST 15 02/18/2014 1130   ALT 10 08/09/2014 1359   ALT 10 02/18/2014 1130   ALKPHOS 82 08/09/2014 1359   ALKPHOS 73 02/18/2014 1130   BILITOT 0.39 08/09/2014 1359   BILITOT 0.4 02/18/2014 1130   GFRNONAA 57* 02/18/2014 1130   GFRAA 66* 02/18/2014 1130    I No results found for: SPEP  Lab Results  Component Value Date   WBC 10.9* 08/09/2014  NEUTROABS 6.8* 08/09/2014   HGB 13.4 08/09/2014   HCT 40.4 08/09/2014   MCV 97.8 08/09/2014   PLT 290 08/09/2014      Chemistry      Component Value Date/Time   NA 140 08/09/2014 1359   NA 143 02/18/2014 1130   K 4.0 08/09/2014 1359   K 4.5 02/18/2014 1130   CL 106 02/18/2014 1130   CO2 26 08/09/2014 1359   CO2 25 02/18/2014 1130   BUN 30.4* 08/09/2014 1359   BUN 21 02/18/2014 1130   CREATININE 1.1 08/09/2014 1359   CREATININE 0.98 02/18/2014 1130   CREATININE 0.90 07/09/2013 0948      Component Value Date/Time   CALCIUM 9.1 08/09/2014 1359   CALCIUM 8.9 02/18/2014 1130   ALKPHOS 82 08/09/2014 1359   ALKPHOS 73 02/18/2014 1130   AST 13 08/09/2014 1359   AST 15 02/18/2014 1130   ALT 10 08/09/2014 1359   ALT 10 02/18/2014 1130   BILITOT 0.39 08/09/2014 1359   BILITOT 0.4 02/18/2014 1130       No results found for: LABCA2  No components found for: [id number]  No results for input(s): INR in the last 168 hours.  Urinalysis No results found for: COLORURINE  STUDIES: Recent chest x-ray obtained at an urgent care facility "normal" per patient report  PATH: Patient: SABIRIN, BARAY Collected: 07/19/2014 Client: [facility] Accession: [id number] Received: 07/20/2014 AMY Martinique, MD DOB: 09-16-1945 Age: 22 Gender: F Reported: 07/21/2014 [place]. Patient Ph: [phone] MRN #: [id number] [place],  [id number] Client Acc#: [id number] Chart #: [phone] Phone: Fax: CC: [facility] INTERNAL CC CC: [name] REPORT OF  DERMATOPATHOLOGY FINAL DIAGNOSIS and MICROSCOPIC DESCRIPTION Diagnosis 1. Skin Biopsy-(P), (A) midline superior right forehead, shave SUPERFICIAL AND NODULAR BASAL CELL CARCINOMA 2. Skin Biopsy-(P), (B) left superior lateral posterior thigh, shave CLEAR CELL ACANTHOMA 3. Skin Biopsy-(P), (A) left mid lateral posterior tibial, shave SQUAMOUS CELL CARCINOMA IN SITU Microscopic Description 1. There are aggregates of atypical basaloid cells arising from the epidermis and maintaining continually with epidermis with dermal nodular aggregates. There is palisading of nuclei at the periphery of these aggregates. 2. There is an acanthotic epidermis with parakeratosis. The proliferating squamous epithelial cells have abundant pale staining cytoplasm with no appreciable atypia. There are scattered neutrophils in the epidermis and a few lymphocytes in the papillary dermis. This histology correlates with clear cell acanthoma. 3. There is a proliferation of atypical keratinocytes which show slow surface maturation and, in areas, there is full thickness epidermal atypia. The changes are those of squamous cell carcinoma in situ. Thresa Ross MD Ph.D. Dermatopathologist, Electronic Signature (Case signed 07/21/2014)     ASSESSMENT: 69 y.o. [place] woman  Status post right upper outer quadrant lumpectomy and axillary lymph node dissection 02/19/2014 for a pT1c pN0, stage IA invasive ductal carcinoma, grade 1, estrogen and progesterone receptor positive, HER-2 not amplified, with an MIB-1 of 12%  (1) Oncotype score of 18 predicts a risk of outside the breast recurrence of 11% if the patient's only systemic treatment is tamoxifen for 5 years. It also suggests no benefit from chemotherapy  (2) the patient opted against adjuvant radiation  (3) anastrozole started 04/19/2015  PLAN: Megumi is tolerating anastrozole well. She is obtaining it at a good price. The plan is to continue that for 5 years.  We will obtain her most recent bone density and review those results at the next visit.  I don't have a simple explanation for her persistent  nausea. We considered gallbladder disease but she is status post cholecystectomy. She has a small hiatal hernia. Again I doubt that would be the cause. She has had some dizziness which is not easily explained, and some headaches. I am not convinced that we are going to find metastases to the brain but I think we have to Loc and I have set her up for a brain MRI within the next week to rule out possibility out.  Similarly with the pain in her left rib cage. This is focal by exam and it is worrisome for bone metastases. Of course it could be equally well do to trauma, but she recalls no trauma to that area. We are going to obtain a bone scan to evaluate that further.  She will call for those results. I expect that they will be benign, in which case she will discuss the nausea and bony discomfort further with her primary care physician.  She has an appointment with Dr. Clarise Cruz in 3 months. Barring new problems she will see me again in 6 months.  Megen  has a good understanding of the overall plan. She agrees with it. She knows the goal of treatment in her case is cure. She will call with any problems that may develop before her next visit here.  Chauncey Cruel, MD   08/16/2014 2:46 PM

## 2014-08-25 ENCOUNTER — Other Ambulatory Visit: Payer: Self-pay | Admitting: Oncology

## 2014-08-25 ENCOUNTER — Telehealth: Payer: Self-pay | Admitting: Oncology

## 2014-08-25 DIAGNOSIS — M810 Age-related osteoporosis without current pathological fracture: Secondary | ICD-10-CM

## 2014-08-25 NOTE — Telephone Encounter (Signed)
S/w pt and she is aware of her bone density appt

## 2014-08-25 NOTE — Progress Notes (Unsigned)
Brittany Kirk's bone density [facility]07/[facility] [place] showed osteoporosis with a T score of -3.1 at the spine. The patient is on vitamin D and calcium supplementation and has been asked to maintain a walking program. We will repeat a bone density scan before her September visit.

## 2014-09-01 ENCOUNTER — Ambulatory Visit (HOSPITAL_COMMUNITY)
Admission: RE | Admit: 2014-09-01 | Discharge: 2014-09-01 | Disposition: A | Discharge status: Home / Self Care | Payer: Medicare Other | Source: Ambulatory Visit | Attending: Oncology | Admitting: Oncology

## 2014-09-01 ENCOUNTER — Encounter (HOSPITAL_COMMUNITY)
Admission: RE | Admit: 2014-09-01 | Discharge: 2014-09-01 | Disposition: A | Discharge status: Home / Self Care | Payer: Medicare Other | Source: Ambulatory Visit | Attending: Oncology | Admitting: Oncology

## 2014-09-01 DIAGNOSIS — R42 Dizziness and giddiness: Secondary | ICD-10-CM | POA: Insufficient documentation

## 2014-09-01 DIAGNOSIS — C50919 Malignant neoplasm of unspecified site of unspecified female breast: Secondary | ICD-10-CM

## 2014-09-01 DIAGNOSIS — J438 Other emphysema: Secondary | ICD-10-CM

## 2014-09-01 DIAGNOSIS — N183 Chronic kidney disease, stage 3 unspecified: Secondary | ICD-10-CM

## 2014-09-01 DIAGNOSIS — C50411 Malignant neoplasm of upper-outer quadrant of right female breast: Secondary | ICD-10-CM

## 2014-09-01 DIAGNOSIS — R110 Nausea: Secondary | ICD-10-CM | POA: Insufficient documentation

## 2014-09-01 DIAGNOSIS — E782 Mixed hyperlipidemia: Secondary | ICD-10-CM

## 2014-09-01 DIAGNOSIS — M25552 Pain in left hip: Secondary | ICD-10-CM | POA: Diagnosis not present

## 2014-09-01 DIAGNOSIS — Z853 Personal history of malignant neoplasm of breast: Secondary | ICD-10-CM | POA: Insufficient documentation

## 2014-09-01 DIAGNOSIS — R200 Anesthesia of skin: Secondary | ICD-10-CM

## 2014-09-01 DIAGNOSIS — R0781 Pleurodynia: Secondary | ICD-10-CM | POA: Diagnosis not present

## 2014-09-01 DIAGNOSIS — R51 Headache: Secondary | ICD-10-CM | POA: Insufficient documentation

## 2014-09-01 MED ORDER — GADOBENATE DIMEGLUMINE 529 MG/ML IV SOLN
15.0000 mL | Freq: Once | INTRAVENOUS | Status: AC | PRN
  Administered 2014-09-01: 15 mL via INTRAVENOUS

## 2014-09-01 MED ORDER — TECHNETIUM TC 99M MEDRONATE IV KIT
25.2000 | PACK | Freq: Once | INTRAVENOUS | Status: AC | PRN
  Administered 2014-09-01: 25.2 via INTRAVENOUS

## 2014-09-02 ENCOUNTER — Encounter: Payer: Self-pay | Admitting: Physician Assistant

## 2014-09-02 ENCOUNTER — Ambulatory Visit (INDEPENDENT_AMBULATORY_CARE_PROVIDER_SITE_OTHER): Payer: Medicare Other | Admitting: Physician Assistant

## 2014-09-02 VITALS — BP 142/74 | HR 72 | Temp 98.4°F | Resp 18 | Wt 159.0 lb

## 2014-09-02 DIAGNOSIS — B9689 Other specified bacterial agents as the cause of diseases classified elsewhere: Principal | ICD-10-CM

## 2014-09-02 DIAGNOSIS — J988 Other specified respiratory disorders: Secondary | ICD-10-CM

## 2014-09-02 MED ORDER — CHLORPHEN-PE-ACETAMINOPHEN 4-10-325 MG PO TABS: 1.0000 | ORAL_TABLET | Freq: Four times a day (QID) | ORAL | Status: DC | PRN

## 2014-09-02 MED ORDER — AZITHROMYCIN 250 MG PO TABS: ORAL_TABLET | ORAL | Status: DC

## 2014-09-02 NOTE — Progress Notes (Signed)
Patient ID: Brittany Kirk MRN: [id number], DOB: 1946-03-16, 69 y.o. Date of Encounter: 09/02/2014, 5:01 PM    Chief Complaint:  Chief Complaint  Patient presents with  . sick x 1 week    sinus infection, pressure, pain, discolored secretions     HPI: 69 y.o. year old white female presents with above symptoms. She is getting very dark very thick yellow green mucus from her nose as well as phlegm from her chest. Feels pressure around her eyeballs. No sore throat or ear pain. No fevers or chills.     Home Meds:   Outpatient Prescriptions Prior to Visit  Medication Sig Dispense Refill  . anastrozole (ARIMIDEX) 1 MG tablet Take 1 tablet (1 mg total) by mouth daily. 90 tablet 4  . aspirin 81 MG tablet Take 81 mg by mouth daily.    . Calcium-Vitamin D-Vitamin K (CALCIUM SOFT CHEWS PO) Take 1 each by mouth daily. Takes 1-2 daily when remembers    . Cholecalciferol (VITAMIN D3) 5000 UNITS CAPS Take 5,000 Units by mouth daily.    . Coenzyme Q10 (CO Q 10) 100 MG CAPS Take by mouth.    . cyanocobalamin 2000 MCG tablet Take 2,000 mcg by mouth daily. B12 slow release tablet daily    . escitalopram (LEXAPRO) 20 MG tablet Take 20 mg by mouth daily.    Marland Kitchen esomeprazole (NEXIUM) 40 MG capsule Take 40 mg by mouth daily at 12 noon.    Marland Kitchen losartan (COZAAR) 50 MG tablet TAKE 1 TABLET EVERY DAY 90 tablet 3  . Multiple Vitamin (MULTIVITAMIN) tablet Take 1 tablet by mouth daily.    Marland Kitchen oxyCODONE (OXY IR/ROXICODONE) 5 MG immediate release tablet Take 1-2 tablets (5-10 mg total) by mouth every 4 (four) hours as needed for moderate pain, severe pain or breakthrough pain. 40 tablet 0   No facility-administered medications prior to visit.    Allergies:  Allergies  Allergen Reactions  . Latex   . Cefaclor Itching, Swelling and Rash  . Metronidazole Itching, Swelling and Rash  . Naproxen Itching, Swelling and Rash  . Nsaids Itching, Swelling and Rash    Can take advil  . Penicillins Itching, Swelling  and Rash  . Septra [Bactrim] Itching, Swelling and Rash  . Sulfonamide Derivatives Itching, Swelling and Rash      Review of Systems: See HPI for pertinent ROS. All other ROS negative.    Physical Exam: Blood pressure 142/74, pulse 72, temperature 98.4 F (36.9 C), temperature source Oral, resp. rate 18, weight 159 lb (72.122 kg)., Body mass index is 30.06 kg/(m^2). General:  WNWD WF. Appears in no acute distress. HEENT: Normocephalic, atraumatic, eyes without discharge, sclera non-icteric, nares are without discharge. Bilateral auditory canals clear, TM's are without perforation, pearly grey and translucent with reflective cone of light bilaterally. Oral cavity moist, posterior pharynx without exudate, erythema, peritonsillar abscess. Positive tenderness with percussion of frontal and maxillary sinuses bilaterally.  Neck: Supple. No thyromegaly. No lymphadenopathy. Lungs: Clear bilaterally to auscultation without wheezes, rales, or rhonchi. Breathing is unlabored. Heart: Regular rhythm. No murmurs, rubs, or gallops. Msk:  Strength and tone normal for age. Extremities/Skin: Warm and dry.  Neuro: Alert and oriented X 3. Moves all extremities spontaneously. Gait is normal. CNII-XII grossly in tact. Psych:  Responds to questions appropriately with a normal affect.     ASSESSMENT AND PLAN:  69 y.o. year old female with  1. Bacterial respiratory infection She brought in an old package of Norel AD from  the past that I had given her. She is requesting a prescription for this. She says that this worked so much better than anything over-the-counter. She is allergic to multiple medications but says that she can tolerate azithromycin and doxycycline. Also discussed using prednisone for relief of sinus pressure and sinus pain. She says that she was recently given some prednisone for another problem but did not ever use it. Discussed possibly using that if she feels this is necessary depending on  mouth of sinus pain she is having. Follow-up if symptoms do not resolve after completion of antibiotic. - azithromycin (ZITHROMAX) 250 MG tablet; Day 1: Take 2 daily.  Days 2-5: Take 1 daily.  Dispense: 6 tablet; Refill: 0 - Chlorphen-PE-Acetaminophen (NOREL AD) 4-10-325 MG TABS; Take 1 tablet by mouth 4 (four) times daily as needed.  Dispense: 30 tablet; Refill: 2   Signed, [address] [place], [place], [facility] 09/02/2014 5:01 PM

## 2014-09-03 ENCOUNTER — Telehealth: Payer: Self-pay | Admitting: *Deleted

## 2014-09-03 NOTE — Telephone Encounter (Signed)
Per Dr. Jana Hakim, patient notified that bone scan and brain MRI show no evidence of cancer. Patient very appreciative of call.

## 2014-10-06 ENCOUNTER — Ambulatory Visit (INDEPENDENT_AMBULATORY_CARE_PROVIDER_SITE_OTHER): Payer: Medicare Other | Admitting: Family Medicine

## 2014-10-06 ENCOUNTER — Encounter: Payer: Self-pay | Admitting: Family Medicine

## 2014-10-06 VITALS — BP 124/68 | HR 76 | Temp 98.2°F | Resp 16 | Ht 61.0 in | Wt 165.0 lb

## 2014-10-06 DIAGNOSIS — M5136 Other intervertebral disc degeneration, lumbar region: Secondary | ICD-10-CM | POA: Diagnosis not present

## 2014-10-06 DIAGNOSIS — D72829 Elevated white blood cell count, unspecified: Secondary | ICD-10-CM | POA: Diagnosis not present

## 2014-10-06 LAB — CBC WITH DIFFERENTIAL/PLATELET
Basophils Absolute: 0 10*3/uL (ref 0.0–0.1)
Basophils Relative: 0 % (ref 0–1)
Eosinophils Absolute: 0.1 10*3/uL (ref 0.0–0.7)
Eosinophils Relative: 1 % (ref 0–5)
HCT: 37.3 % (ref 36.0–46.0)
Hemoglobin: 12.5 g/dL (ref 12.0–15.0)
Lymphocytes Relative: 37 % (ref 12–46)
Lymphs Abs: 3 10*3/uL (ref 0.7–4.0)
MCH: 31.9 pg (ref 26.0–34.0)
MCHC: 33.5 g/dL (ref 30.0–36.0)
MCV: 95.2 fL (ref 78.0–100.0)
MPV: 9.9 fL (ref 8.6–12.4)
Monocytes Absolute: 0.4 10*3/uL (ref 0.1–1.0)
Monocytes Relative: 5 % (ref 3–12)
Neutro Abs: 4.7 10*3/uL (ref 1.7–7.7)
Neutrophils Relative %: 57 % (ref 43–77)
Platelets: 276 10*3/uL (ref 150–400)
RBC: 3.92 MIL/uL (ref 3.87–5.11)
RDW: 14.3 % (ref 11.5–15.5)
WBC: 8.2 10*3/uL (ref 4.0–10.5)

## 2014-10-06 LAB — URINALYSIS, ROUTINE W REFLEX MICROSCOPIC
Bilirubin Urine: NEGATIVE
Glucose, UA: NEGATIVE mg/dL
Ketones, ur: NEGATIVE mg/dL
Leukocytes, UA: NEGATIVE
Nitrite: NEGATIVE
Protein, ur: NEGATIVE mg/dL
Specific Gravity, Urine: >1.03 — ABNORMAL HIGH (ref 1.005–1.030)
Urobilinogen, UA: 0.2 mg/dL (ref 0.0–1.0)
pH: 5.5 (ref 5.0–8.0)

## 2014-10-06 LAB — URINALYSIS, MICROSCOPIC ONLY: Crystals: NONE SEEN

## 2014-10-06 MED ORDER — HYDROCODONE-ACETAMINOPHEN 5-325 MG PO TABS: 1.0000 | ORAL_TABLET | Freq: Four times a day (QID) | ORAL | Status: DC | PRN

## 2014-10-06 NOTE — Progress Notes (Signed)
Patient ID: Brittany Kirk, female   DOB: August 03, 1945, 69 y.o.   MRN: [id number]   Subjective:    Patient ID: Brittany Kirk, female    DOB: 12/20/1945, 69 y.o.   MRN: [id number]  Patient presents for Low Back Pain  aging here with bilateral low back pain for the past month. She is history of breast cancer which is currently in remission. She denies any dysuria no change in her bowels. Her pain does not radiate. She is tried taking a little ibuprofen  Though her allergy list states that she cannot take any NSAIDs she also states that she tried Tylenol with no improvement. No new paresthesias in her lower extremities. She is concerned because of her history of cancer. She had a bone scan done as well as an MRI of the brain done in March which was negative for any recurrence of cancer. She did have a CBC done in February which showed a slightly elevated leukocytosis and she's been very worried about this especially when her back pain started. On review of her chart she had x-rays from quite a few years ago which showed some degenerative disc disease in her lumbar spine. She has not had any recent falls.    Review Of Systems:  GEN- denies fatigue, fever, weight loss,weakness, recent illness HEENT- denies eye drainage, change in vision, nasal discharge, CVS- denies chest pain, palpitations RESP- denies SOB, cough, wheeze ABD- denies N/V, change in stools, abd pain GU- denies dysuria, hematuria, dribbling, incontinence MSK- + joint pain, muscle aches, injury Neuro- denies headache, dizziness, syncope, seizure activity       Objective:    BP 124/68 mmHg  Pulse 76  Temp(Src) 98.2 F (36.8 C) (Oral)  Resp 16  Ht 5\' 1"  (1.549 m)  Wt 165 lb (74.844 kg)  BMI 31.19 kg/m2 GEN- NAD, alert and oriented x3 CVS- RRR, no murmur RESP-CTAB ABD-NABS,soft,NT,ND, no CVA tenderness MSK- TTP lumbar spine and paraspinals, no spasm noted, fair ROM spine, Fair ROM HIPS/KNEES, neg SLR, motor equal bilat, non  antalgic gait EXT- No edema Pulses- Radial 2+, DP- 1+ ( purplish hue chronic to toes)        Assessment & Plan:      Problem List Items Addressed This Visit    None    Visit Diagnoses    DDD (degenerative disc disease), lumbar    -  Primary    Most likley DDD causing pain, obtain xray, r/o compression fracture as well, no red flags, given norco, she had oxycodone at home but threw it away, heat to bac    Relevant Medications    HYDROcodone-acetaminophen (NORCO) 5-325 MG per tablet    Other Relevant Orders    Urinalysis, Routine w reflex microscopic (Completed)    Leukocytosis        recheck CBC at pt request, dount contributing to back pain, with neg bone scan, UA also unremarkable in clinic    Relevant Orders    CBC with Differential/Platelet    Basic metabolic panel       Note: This dictation was prepared with Dragon dictation along with smaller phrase technology. Any transcriptional errors that result from this process are unintentional.

## 2014-10-06 NOTE — Patient Instructions (Signed)
Get xrays done on Friday evening  Take pain medication as prescribed  Try the heating pad to lower back

## 2014-10-07 LAB — BASIC METABOLIC PANEL
BUN: 23 mg/dL (ref 6–23)
CO2: 26 mEq/L (ref 19–32)
Calcium: 9.1 mg/dL (ref 8.4–10.5)
Chloride: 104 mEq/L (ref 96–112)
Creat: 0.92 mg/dL (ref 0.50–1.10)
Glucose, Bld: 105 mg/dL — ABNORMAL HIGH (ref 70–99)
Potassium: 4.1 mEq/L (ref 3.5–5.3)
Sodium: 140 mEq/L (ref 135–145)

## 2014-10-08 ENCOUNTER — Ambulatory Visit
Admission: RE | Admit: 2014-10-08 | Discharge: 2014-10-08 | Disposition: A | Discharge status: Home / Self Care | Payer: Medicare Other | Source: Ambulatory Visit | Attending: Family Medicine | Admitting: Family Medicine

## 2014-10-08 ENCOUNTER — Other Ambulatory Visit: Payer: Self-pay | Admitting: *Deleted

## 2014-10-08 DIAGNOSIS — M5136 Other intervertebral disc degeneration, lumbar region: Secondary | ICD-10-CM | POA: Diagnosis not present

## 2014-10-08 DIAGNOSIS — M545 Low back pain, unspecified: Secondary | ICD-10-CM

## 2014-10-08 DIAGNOSIS — M47816 Spondylosis without myelopathy or radiculopathy, lumbar region: Secondary | ICD-10-CM | POA: Diagnosis not present

## 2014-10-18 DIAGNOSIS — C50411 Malignant neoplasm of upper-outer quadrant of right female breast: Secondary | ICD-10-CM | POA: Diagnosis not present

## 2014-12-10 ENCOUNTER — Telehealth: Payer: Self-pay | Admitting: Family Medicine

## 2014-12-10 MED ORDER — HYDROCODONE-ACETAMINOPHEN 5-325 MG PO TABS: 1.0000 | ORAL_TABLET | Freq: Four times a day (QID) | ORAL | Status: DC | PRN

## 2014-12-10 NOTE — Telephone Encounter (Signed)
Okay to refill? 

## 2014-12-10 NOTE — Telephone Encounter (Signed)
[phone] Or [phone] PT is needing a refill on HYDROcodone-acetaminophen (NORCO) 5-325 MG per tablet

## 2014-12-10 NOTE — Telephone Encounter (Signed)
LRF 10/06/14 #30  LOV 10/06/14  OK refill?

## 2014-12-10 NOTE — Telephone Encounter (Signed)
rx ready and pt aware 

## 2014-12-20 ENCOUNTER — Other Ambulatory Visit: Payer: Self-pay

## 2014-12-20 ENCOUNTER — Other Ambulatory Visit: Payer: Self-pay | Admitting: Obstetrics and Gynecology

## 2014-12-20 DIAGNOSIS — Z853 Personal history of malignant neoplasm of breast: Secondary | ICD-10-CM

## 2015-01-28 ENCOUNTER — Other Ambulatory Visit: Payer: Self-pay | Admitting: Obstetrics and Gynecology

## 2015-01-28 ENCOUNTER — Other Ambulatory Visit: Payer: Self-pay | Admitting: *Deleted

## 2015-01-28 ENCOUNTER — Other Ambulatory Visit: Payer: Self-pay | Admitting: Oncology

## 2015-01-28 DIAGNOSIS — Z853 Personal history of malignant neoplasm of breast: Secondary | ICD-10-CM

## 2015-01-28 DIAGNOSIS — C50919 Malignant neoplasm of unspecified site of unspecified female breast: Secondary | ICD-10-CM

## 2015-01-31 ENCOUNTER — Telehealth: Payer: Self-pay | Admitting: *Deleted

## 2015-01-31 ENCOUNTER — Other Ambulatory Visit: Payer: Self-pay | Admitting: Oncology

## 2015-01-31 ENCOUNTER — Other Ambulatory Visit: Payer: Self-pay | Admitting: *Deleted

## 2015-01-31 NOTE — Telephone Encounter (Signed)
DR.MAGRINAT NEEDS TO COSIGN ORDER BEFORE PT.'S APPOINTMENT AT 1:50PM TODAY. NOTIFIED DR.MAGRINAT'S NURSE, VAL DODD,RN.

## 2015-02-03 ENCOUNTER — Ambulatory Visit
Admission: RE | Admit: 2015-02-03 | Discharge: 2015-02-03 | Disposition: A | Discharge status: Home / Self Care | Payer: Medicare Other | Source: Ambulatory Visit | Attending: Oncology | Admitting: Oncology

## 2015-02-03 DIAGNOSIS — C50919 Malignant neoplasm of unspecified site of unspecified female breast: Secondary | ICD-10-CM

## 2015-02-03 DIAGNOSIS — N649 Disorder of breast, unspecified: Secondary | ICD-10-CM | POA: Diagnosis not present

## 2015-02-07 ENCOUNTER — Ambulatory Visit
Admission: RE | Admit: 2015-02-07 | Discharge: 2015-02-07 | Disposition: A | Discharge status: Home / Self Care | Payer: Medicare Other | Source: Ambulatory Visit | Attending: Oncology | Admitting: Oncology

## 2015-02-07 DIAGNOSIS — M810 Age-related osteoporosis without current pathological fracture: Secondary | ICD-10-CM

## 2015-02-07 DIAGNOSIS — Z780 Asymptomatic menopausal state: Secondary | ICD-10-CM | POA: Diagnosis not present

## 2015-02-09 ENCOUNTER — Telehealth: Payer: Self-pay | Admitting: Family Medicine

## 2015-02-09 MED ORDER — PROMETHAZINE HCL 25 MG PO TABS: 25.0000 mg | ORAL_TABLET | Freq: Four times a day (QID) | ORAL | Status: DC | PRN

## 2015-02-09 NOTE — Telephone Encounter (Signed)
Pt call with new Rx and recommendations.  If still not better will need to see.

## 2015-02-09 NOTE — Telephone Encounter (Signed)
Phenergan 25 mg 1 by mouth every 4-6 hours PRN nausea #30+0. Caution her that this may cause drowsiness and do not take prior to driving etc.

## 2015-02-09 NOTE — Telephone Encounter (Signed)
Pt having nausea and diarrhea.  Has some Kaopectate for diarrhea but is asking for something for the nausea.  Doesn't fell she can make it to office with the watery diarrhea.

## 2015-02-15 ENCOUNTER — Other Ambulatory Visit (HOSPITAL_BASED_OUTPATIENT_CLINIC_OR_DEPARTMENT_OTHER): Payer: Medicare Other

## 2015-02-15 DIAGNOSIS — C50411 Malignant neoplasm of upper-outer quadrant of right female breast: Secondary | ICD-10-CM

## 2015-02-15 DIAGNOSIS — C50911 Malignant neoplasm of unspecified site of right female breast: Secondary | ICD-10-CM

## 2015-02-15 LAB — COMPREHENSIVE METABOLIC PANEL (CC13)
ALT: 9 U/L (ref 0–55)
AST: 14 U/L (ref 5–34)
Albumin: 3.4 g/dL — ABNORMAL LOW (ref 3.5–5.0)
Alkaline Phosphatase: 82 U/L (ref 40–150)
Anion Gap: 7 mEq/L (ref 3–11)
BUN: 21.4 mg/dL (ref 7.0–26.0)
CO2: 24 mEq/L (ref 22–29)
Calcium: 8.7 mg/dL (ref 8.4–10.4)
Chloride: 108 mEq/L (ref 98–109)
Creatinine: 1.1 mg/dL (ref 0.6–1.1)
EGFR: 52 mL/min/{1.73_m2} — ABNORMAL LOW (ref >90)
Glucose: 105 mg/dl (ref 70–140)
Potassium: 4.3 mEq/L (ref 3.5–5.1)
Sodium: 139 mEq/L (ref 136–145)
Total Bilirubin: 0.3 mg/dL (ref 0.20–1.20)
Total Protein: 6.4 g/dL (ref 6.4–8.3)

## 2015-02-15 LAB — CBC WITH DIFFERENTIAL/PLATELET
BASO%: 0.6 % (ref 0.0–2.0)
Basophils Absolute: 0.1 10*3/uL (ref 0.0–0.1)
EOS%: 1.4 % (ref 0.0–7.0)
Eosinophils Absolute: 0.1 10*3/uL (ref 0.0–0.5)
HCT: 38.2 % (ref 34.8–46.6)
HGB: 12.7 g/dL (ref 11.6–15.9)
LYMPH%: 35.3 % (ref 14.0–49.7)
MCH: 32 pg (ref 25.1–34.0)
MCHC: 33.3 g/dL (ref 31.5–36.0)
MCV: 96 fL (ref 79.5–101.0)
MONO#: 0.6 10*3/uL (ref 0.1–0.9)
MONO%: 5.7 % (ref 0.0–14.0)
NEUT#: 5.5 10*3/uL (ref 1.5–6.5)
NEUT%: 57 % (ref 38.4–76.8)
Platelets: 261 10*3/uL (ref 145–400)
RBC: 3.98 10*6/uL (ref 3.70–5.45)
RDW: 14.3 % (ref 11.2–14.5)
WBC: 9.7 10*3/uL (ref 3.9–10.3)
lymph#: 3.4 10*3/uL — ABNORMAL HIGH (ref 0.9–3.3)

## 2015-02-22 ENCOUNTER — Telehealth: Payer: Self-pay | Admitting: Oncology

## 2015-02-22 ENCOUNTER — Ambulatory Visit (HOSPITAL_BASED_OUTPATIENT_CLINIC_OR_DEPARTMENT_OTHER): Payer: Medicare Other | Admitting: Oncology

## 2015-02-22 ENCOUNTER — Ambulatory Visit (INDEPENDENT_AMBULATORY_CARE_PROVIDER_SITE_OTHER): Payer: Medicare Other | Admitting: Family Medicine

## 2015-02-22 ENCOUNTER — Other Ambulatory Visit: Payer: Self-pay | Admitting: Family Medicine

## 2015-02-22 VITALS — BP 132/53 | HR 62 | Temp 98.2°F | Resp 18 | Ht 61.0 in | Wt 162.0 lb

## 2015-02-22 DIAGNOSIS — N183 Chronic kidney disease, stage 3 unspecified: Secondary | ICD-10-CM

## 2015-02-22 DIAGNOSIS — R030 Elevated blood-pressure reading, without diagnosis of hypertension: Secondary | ICD-10-CM

## 2015-02-22 DIAGNOSIS — R42 Dizziness and giddiness: Secondary | ICD-10-CM

## 2015-02-22 DIAGNOSIS — Z170 Estrogen receptor positive status [ER+]: Secondary | ICD-10-CM

## 2015-02-22 DIAGNOSIS — Z23 Encounter for immunization: Secondary | ICD-10-CM

## 2015-02-22 DIAGNOSIS — Z79811 Long term (current) use of aromatase inhibitors: Secondary | ICD-10-CM | POA: Diagnosis not present

## 2015-02-22 DIAGNOSIS — C50919 Malignant neoplasm of unspecified site of unspecified female breast: Secondary | ICD-10-CM

## 2015-02-22 DIAGNOSIS — C50411 Malignant neoplasm of upper-outer quadrant of right female breast: Secondary | ICD-10-CM | POA: Diagnosis not present

## 2015-02-22 DIAGNOSIS — M810 Age-related osteoporosis without current pathological fracture: Secondary | ICD-10-CM | POA: Diagnosis not present

## 2015-02-22 MED ORDER — ANASTROZOLE 1 MG PO TABS: 1.0000 mg | ORAL_TABLET | Freq: Every day | ORAL | Status: DC

## 2015-02-22 MED ORDER — HYDROCODONE-ACETAMINOPHEN 5-325 MG PO TABS: 1.0000 | ORAL_TABLET | Freq: Four times a day (QID) | ORAL | Status: DC | PRN

## 2015-02-22 NOTE — Telephone Encounter (Signed)
LRF 12/10/14  OK refill?  Provider OK, Rx signed and given to patient

## 2015-02-22 NOTE — Telephone Encounter (Signed)
Appointments made  And avs printed for patient °

## 2015-02-22 NOTE — Progress Notes (Signed)
[facility]  [phone] [phone]     ID: Birtha Hatler Stormes DOB: 06-07-1946  MR#: [id number]  EGB#:[id number]  Patient Care Team: Susy Frizzle, MD as PCP - General (Family Medicine) Elsie Stain, MD (Pulmonary Disease) Chauncey Cruel, MD as Consulting Physician (Oncology) Thea Silversmith, MD as Consulting Physician (Radiation Oncology) Dian Queen, MD as Consulting Physician (Obstetrics and Gynecology) OTHER MD: Griselda Miner M.D.  CHIEF COMPLAINT: Early stage estrogen receptor positive breast cancer  CURRENT TREATMENT: anastrozole   BREAST CANCER HISTORY: From the earlier intake note:  Avilene underwent routine screening mammography August of 2015 showing a possible mass in the right breast. On 01/19/2014 at the breast center she underwent right diagnostic mammography and ultrasonography. The breast density was category B. The mass appeared spiculated on spot compression views and an ultrasound it was nearly isoechoic. It was located at the 10:00 position 4 cm from the nipple and measured 7.5 mm by ultrasonography. Ultrasound of the right axilla was negative.  On 01/26/2014 the patient underwent biopsy of the mass in question and this showed ([id number]) and invasive ductal carcinoma, grade 1, estrogen receptor 100% positive, progesterone receptor 60% positive, both with strong staining intensity, with an MIB-1 of 12%, and no HER-2 modification, the signals ratio being 1.38 and the number per cell 3.30.  On 02/19/2014 the patient underwent right lumpectomy. Sentinel lymph node sampling was attempted but failed and accordingly the patient received a full axillary lymph node dissection. The final pathology from this procedure ([id number]) showed an invasive ductal carcinoma, 1.4 cm, grade 1, with negative margins, and all 19 lymph nodes clear.  Her subsequent history is as detailed below  INTERVAL HISTORY: Leola returns today for  follow-up of her early stage breast cancer. She continues on anastrozole, with good tolerance. She obtains it at a very good price. She denies significant problems with hot flashes, vaginal dryness, or arthralgias/myalgias.  REVIEW OF SYSTEMS: Abri will sit at her computer for an hour or 2 and then when she stands for a few seconds she feels "tingly all over", not quite lightheaded and certainly not like she is going to pass out. This doesn't happen all the time but it happens "a lot". Aside from these issues she has problems with her dentures. She gets short of breath when walking up stairs but gets to the top without stopping. She has some reflux issues. She has back pain but as stated no significant arthritis pain. She expressed a great deal of concern about today's blood pressure. A detailed review of systems today was otherwise stable  PAST MEDICAL HISTORY: Past Medical History  Diagnosis Date  . Hyperlipidemia   . Hypertension   . Depression   . Tobacco user   . History of skin cancer   . Allergic rhinitis   . Cancer   . CAD (coronary artery disease)   . Emphysema     no longer uses inhalers  . Shortness of breath     sob if walks up flight of stairs  . Gallstones     nausea, pain upper right abdomen  . H/O hiatal hernia   . GERD (gastroesophageal reflux disease)   . Complication of anesthesia     sore throat after a surgery  . Asymptomatic bilateral carotid artery stenosis   . Wears dentures     top    PAST SURGICAL HISTORY: Past Surgical History  Procedure Laterality Date  . Tubal ligation    .  Tonsilectomy, adenoidectomy, bilateral myringotomy and tubes    . Cholecystectomy  07/08/2012    Procedure: LAPAROSCOPIC CHOLECYSTECTOMY WITH INTRAOPERATIVE CHOLANGIOGRAM;  Surgeon: Gayland Curry, MD,FACS;  Location: [facility];  Service: General;  Laterality: N/A;  Laparoscopic Cholecystectomy with Intraoperative Cholangiogram  . Colonoscopy      FAMILY HISTORY Family History    Problem Relation Age of Onset  . Heart disease      5 brothers and 2 sister  . Arthritis Mother   . Diverticulitis Sister    the patient's father died at the age of 29. The patient's mother died at the age of 77 from pancreatic cancer which had been diagnosed a year earlier. The patient had 6 brothers, 2 sisters. There is no history of breast or ovarian cancer in the family.  GYNECOLOGIC HISTORY:  No LMP recorded. Patient is postmenopausal. Menarche age 33, first live birth age 28. The patient is GX P1. She went through menopause at approximately age 55. She did not take hormone replacement.   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 She has a son from her first marriage, [name] "really" [name], who works in [place] as a Dealer. The patient has 2 biological grandchildren, including a 31 year old grandson who is undergoing bone marrow transplant at [facility] for aplastic anemia 03/25/2014. Delilah's current, second husband, Dorena Bodo"), worked in Arboriculturist. He is now retired. He also has 2 grandchildren from an earlier marriage. He is a Airline pilot. The patient attends a local [employer].    ADVANCED DIRECTIVES: Not in place   HEALTH MAINTENANCE: Social History  Substance Use Topics  . Smoking status: Former Smoker -- 0.50 packs/day for 40 years    Types: Cigarettes    Quit date: 02/12/2013  . Smokeless tobacco: Never Used  . Alcohol Use: No     Colonoscopy:  PAP:  Bone density: At Dr. Christen Butter; osteopenia  Lipid panel:  Allergies  Allergen Reactions  . Latex   . Cefaclor Itching, Swelling and Rash  . Metronidazole Itching, Swelling and Rash  . Naproxen Itching, Swelling and Rash  . Nsaids Itching, Swelling and Rash    Can take advil  . Penicillins Itching, Swelling and Rash  . Septra [Bactrim] Itching, Swelling and Rash  . Sulfonamide Derivatives Itching, Swelling and  Rash    Current Outpatient Prescriptions  Medication Sig Dispense Refill  . anastrozole (ARIMIDEX) 1 MG tablet Take 1 tablet (1 mg total) by mouth daily. 90 tablet 4  . aspirin 81 MG tablet Take 81 mg by mouth daily.    . Calcium-Vitamin D-Vitamin K (CALCIUM SOFT CHEWS PO) Take 1 each by mouth daily. Takes 1-2 daily when remembers    . Chlorphen-PE-Acetaminophen (NOREL AD) 4-10-325 MG TABS Take 1 tablet by mouth 4 (four) times daily as needed. 30 tablet 2  . Cholecalciferol (VITAMIN D3) 5000 UNITS CAPS Take 5,000 Units by mouth daily.    . Coenzyme Q10 (CO Q 10) 100 MG CAPS Take by mouth.    . cyanocobalamin 2000 MCG tablet Take 2,000 mcg by mouth daily. B12 slow release tablet daily    . escitalopram (LEXAPRO) 20 MG tablet Take 20 mg by mouth daily.    Marland Kitchen esomeprazole (NEXIUM) 40 MG capsule Take 40 mg by mouth daily at 12 noon.    Marland Kitchen HYDROcodone-acetaminophen (NORCO) 5-325 MG per tablet Take 1 tablet by mouth every 6 (six) hours as needed for moderate  pain. 30 tablet 0  . losartan (COZAAR) 50 MG tablet TAKE 1 TABLET EVERY DAY 90 tablet 3  . Multiple Vitamin (MULTIVITAMIN) tablet Take 1 tablet by mouth daily.    . promethazine (PHENERGAN) 25 MG tablet Take 1 tablet (25 mg total) by mouth every 6 (six) hours as needed for nausea or vomiting. 30 tablet 0   No current facility-administered medications for this visit.    OBJECTIVE: Middle-aged white woman who appears stated age 8 Vitals:   02/22/15 1434  BP: 132/53  Pulse: 62  Temp: 98.2 F (36.8 C)  Resp: 18     Body mass index is 30.63 kg/(m^2).    ECOG FS:1 - Symptomatic but completely ambulatory  Sclerae unicteric, pupils round and equal Oropharynx clear and moist-- no thrush or other lesions No cervical or supraclavicular adenopathy Lungs no rales or rhonchi Heart regular rate and rhythm Abd soft, obese, nontender, positive bowel sounds MSK no focal spinal tenderness, no upper extremity lymphedema Neuro: Entirely nonfocal,  the patient is well oriented, with appropriate/anxious affect Breasts: The right breast is status post lumpectomy. There is no evidence of local recurrence. The right axilla is benign. The left breast is unremarkable.    LAB RESULTS:  CMP     Component Value Date/Time   NA 139 02/15/2015 1355   NA 140 10/06/2014 1614   K 4.3 02/15/2015 1355   K 4.1 10/06/2014 1614   CL 104 10/06/2014 1614   CO2 24 02/15/2015 1355   CO2 26 10/06/2014 1614   GLUCOSE 105 02/15/2015 1355   GLUCOSE 105* 10/06/2014 1614   BUN 21.4 02/15/2015 1355   BUN 23 10/06/2014 1614   CREATININE 1.1 02/15/2015 1355   CREATININE 0.92 10/06/2014 1614   CREATININE 0.98 02/18/2014 1130   CALCIUM 8.7 02/15/2015 1355   CALCIUM 9.1 10/06/2014 1614   PROT 6.4 02/15/2015 1355   PROT 6.6 02/18/2014 1130   ALBUMIN 3.4* 02/15/2015 1355   ALBUMIN 3.3* 02/18/2014 1130   AST 14 02/15/2015 1355   AST 15 02/18/2014 1130   ALT 9 02/15/2015 1355   ALT 10 02/18/2014 1130   ALKPHOS 82 02/15/2015 1355   ALKPHOS 73 02/18/2014 1130   BILITOT 0.30 02/15/2015 1355   BILITOT 0.4 02/18/2014 1130   GFRNONAA 57* 02/18/2014 1130   GFRAA 66* 02/18/2014 1130    I No results found for: SPEP  Lab Results  Component Value Date   WBC 9.7 02/15/2015   NEUTROABS 5.5 02/15/2015   HGB 12.7 02/15/2015   HCT 38.2 02/15/2015   MCV 96.0 02/15/2015   PLT 261 02/15/2015      Chemistry      Component Value Date/Time   NA 139 02/15/2015 1355   NA 140 10/06/2014 1614   K 4.3 02/15/2015 1355   K 4.1 10/06/2014 1614   CL 104 10/06/2014 1614   CO2 24 02/15/2015 1355   CO2 26 10/06/2014 1614   BUN 21.4 02/15/2015 1355   BUN 23 10/06/2014 1614   CREATININE 1.1 02/15/2015 1355   CREATININE 0.92 10/06/2014 1614   CREATININE 0.98 02/18/2014 1130      Component Value Date/Time   CALCIUM 8.7 02/15/2015 1355   CALCIUM 9.1 10/06/2014 1614   ALKPHOS 82 02/15/2015 1355   ALKPHOS 73 02/18/2014 1130   AST 14 02/15/2015 1355   AST 15  02/18/2014 1130   ALT 9 02/15/2015 1355   ALT 10 02/18/2014 1130   BILITOT 0.30 02/15/2015 1355   BILITOT 0.4 02/18/2014  1130       No results found for: LABCA2  No components found for: LABCA125  No results for input(s): INR in the last 168 hours.  Urinalysis    Component Value Date/Time   COLORURINE YELLOW 10/06/2014 1614    STUDIES: Dg Bone Density  02/07/2015   CLINICAL DATA:  69 year old postmenopausal Caucasian female. No personal fragility fracture history. Personal history of breast cancer.  EXAM: DUAL X-RAY ABSORPTIOMETRY (DXA) FOR BONE MINERAL DENSITY  FINDINGS: AP LUMBAR SPINE L1 through L4  Bone Mineral Density (BMD):  0.700 g/cm2  Young Adult T-Score:  -3.2  Z-Score:  -1.0  Left FEMUR neck  Bone Mineral Density (BMD):  0.619 g/cm2  Young Adult T-Score: -2.1  Z-Score:  -0.2  ASSESSMENT: Patient's diagnostic category is osteoporosis by WHO Criteria.  FRACTURE RISK: Increased  FRAX: World Health Organization FRAX assessment of absolute fracture risk is not calculated for this patient because the patient has osteoporosis.  COMPARISON: None  Effective therapies are available in the form of bisphosphonates, selective estrogen receptor modulators, biologic agents, and hormone replacement therapy (for women). All patients should ensure an adequate intake of dietary calcium (1200 mg daily) and vitamin D (800 IU daily) unless contraindicated.  All treatment decisions require clinical judgment and consideration of individual patient factors, including patient preferences, co-morbidities, previous drug use, risk factors not captured in the FRAX model (e.g., frailty, falls, vitamin D deficiency, increased bone turnover, interval significant decline in bone density) and possible under- or over-estimation of fracture risk by FRAX.  The National Osteoporosis Foundation recommends that FDA-approved medical therapies be considered in postmenopausal women and men age 21 or older with a:  1. Hip or  vertebral (clinical or morphometric) fracture.  2. T-score of -2.5 or lower at the spine or hip.  3. Ten-year fracture probability by FRAX of 3% or greater for hip fracture or 20% or greater for major osteoporotic fracture.  People with diagnosed cases of osteoporosis or at high risk for fracture should have regular bone mineral density tests. For patients eligible for Medicare, routine testing is allowed once every 2 years. The testing frequency can be increased to one year for patients who have rapidly progressing disease, those who are receiving or discontinuing medical therapy to restore bone mass, or have additional risk factors.  World [facility]) Criteria:  Normal: T-scores from +1.0 to -1.0  Low Bone Mass (Osteopenia): T-scores between -1.0 and -2.5  Osteoporosis: T-scores -2.5 and below  Comparison to Reference Population:  T-score is the key measure used in the diagnosis of osteoporosis and relative risk determination for fracture. It provides a value for bone mass relative to the mean bone mass of a young adult reference population expressed in terms of standard deviation (SD).  Z-score is the age-matched score showing the patient's values compared to a population matched for age, sex, and race. This is also expressed in terms of standard deviation. The patient may have values that compare favorably to the age-matched values and still be at increased risk for fracture.   Electronically Signed   By: Altamese Cabal M.D.   On: 02/07/2015 16:18   Mm Diag Breast Tomo Bilateral  02/03/2015   CLINICAL DATA:  History of right lumpectomy for breast cancer 2015. Initial post lumpectomy exam. The patient states she feels mild skin thickening in the region of the lumpectomy site.  EXAM: DIGITAL DIAGNOSTIC bilateral MAMMOGRAM WITH 3D TOMOSYNTHESIS AND CAD  COMPARISON:  Previous exam(s).  ACR Breast Density  Category a: The breast tissue is almost entirely fatty.  FINDINGS: Right lumpectomy changes  are noted. Anterior to the lumpectomy site, the breast parenchyma is entirely fatty and no underlying intramammary abnormality is identified there or elsewhere in either breast.  Mammographic images were processed with CAD.  IMPRESSION: Right lumpectomy changes, no evidence for malignancy in either side.  RECOMMENDATION: Diagnostic mammogram is suggested in 1 year. (Code:DM-B-01Y)  I have discussed the findings and recommendations with the patient. Results were also provided in writing at the conclusion of the visit. If applicable, a reminder letter will be sent to the patient regarding the next appointment.  BI-RADS CATEGORY  2: Benign.   Electronically Signed   By: Conchita Paris M.D.   On: 02/03/2015 13:47    ASSESSMENT: 69 y.o. [place] woman  Status post right upper outer quadrant lumpectomy and axillary lymph node dissection 02/19/2014 for a pT1c pN0, stage IA invasive ductal carcinoma, grade 1, estrogen and progesterone receptor positive, HER-2 not amplified, with an MIB-1 of 12%  (1) Oncotype score of 18 predicts a risk of outside the breast recurrence of 11% if the patient's only systemic treatment is tamoxifen for 5 years. It also suggests no benefit from chemotherapy  (2) the patient opted against adjuvant radiation  (3) anastrozole started 04/18/2014  (a) DEXA scan at [facility] August 20 03/01/2015 showed a T score of -3.2 (osteoporosis)  PLAN: Maeghan is now a year out from her definitive surgery with no evidence of disease recurrence. She is tolerating anastrozole generally well. The plan is to continue that for a total of 5 years, at which point she will be released from follow-up.  We discussed the bone density issues at length. She aleady has osteoporosis and of course anastrozole can make that worse. We discussed pharmacologic agents that will help turn that around, and specifically suggested denosumab/ Prolia. We discussed the possible toxicities, side effects and  complications. At this point she wants to "think about it". In the meantime she will continue vitamin D supplementation and try to increase her weight-bearing exercises, primarily walking.  She was very concerned about her blood pressure which was 132/53. We discussed the fact that as we become older the arteries become less flexible and therefore the systolic blood pressure is hard to control without dropping the diastolic significantly. I do not want to make any change in her blood pressure medication. She can discuss that further with her primary care physician. I did suggest since sometimes when she stands up after sitting for a long time she feels slightly lightheaded, that she consider compression stockings.  Recall that we obtained a brain MRI in March 2016 to assess the symptoms and there was no evidence of cancer.  At this point I am comfortable seeing her on a yearly basis. We will repeat lab work the same day. She knows to call for any problems that may develop before then.   Chauncey Cruel, MD   02/22/2015 3:15 PM

## 2015-02-23 MED ORDER — ANASTROZOLE 1 MG PO TABS: 1.0000 mg | ORAL_TABLET | Freq: Every day | ORAL | Status: DC

## 2015-04-11 ENCOUNTER — Ambulatory Visit (INDEPENDENT_AMBULATORY_CARE_PROVIDER_SITE_OTHER): Payer: Medicare Other | Admitting: Physician Assistant

## 2015-04-11 ENCOUNTER — Encounter: Payer: Self-pay | Admitting: Physician Assistant

## 2015-04-11 VITALS — BP 134/70 | HR 72 | Temp 98.1°F | Resp 18 | Wt 159.0 lb

## 2015-04-11 DIAGNOSIS — M26621 Arthralgia of right temporomandibular joint: Secondary | ICD-10-CM

## 2015-04-11 NOTE — Progress Notes (Signed)
Patient ID: TINY CHAUDHARY MRN: [id number], DOB: Apr 09, 1946, 69 y.o. Date of Encounter: 04/11/2015, 11:44 AM    Chief Complaint:  Chief Complaint  Patient presents with  . rt ear infection    x 1 week, now pain going down neck     HPI: 69 y.o. year old white female states that she has been having pain in her right ear for the past 1 week. Pain radiates down her right side of her neck.  States that this morning she blew a little bit of yellow drainage from her nose but this was the only time she has seen this. She has had no cough. Has had no fevers. No sore throat.     Home Meds:   Outpatient Prescriptions Prior to Visit  Medication Sig Dispense Refill  . anastrozole (ARIMIDEX) 1 MG tablet Take 1 tablet (1 mg total) by mouth daily. 90 tablet 4  . aspirin 81 MG tablet Take 81 mg by mouth daily.    . Cholecalciferol (VITAMIN D3) 5000 UNITS CAPS Take 5,000 Units by mouth daily.    . Coenzyme Q10 (CO Q 10) 100 MG CAPS Take by mouth.    . cyanocobalamin 2000 MCG tablet Take 2,000 mcg by mouth daily. B12 slow release tablet daily    . esomeprazole (NEXIUM) 40 MG capsule Take 40 mg by mouth daily at 12 noon.    Marland Kitchen HYDROcodone-acetaminophen (NORCO) 5-325 MG per tablet Take 1 tablet by mouth every 6 (six) hours as needed for moderate pain. 30 tablet 0  . losartan (COZAAR) 50 MG tablet TAKE 1 TABLET EVERY DAY 90 tablet 3  . Multiple Vitamin (MULTIVITAMIN) tablet Take 1 tablet by mouth daily.    . NOREL AD 4-10-325 MG TABS TAKE 1 TABLET BY MOUTH 4 (FOUR) TIMES DAILY AS NEEDED.  2  . escitalopram (LEXAPRO) 20 MG tablet Take 20 mg by mouth daily.     No facility-administered medications prior to visit.    Allergies:  Allergies  Allergen Reactions  . Latex   . Cefaclor Itching, Swelling and Rash  . Metronidazole Itching, Swelling and Rash  . Naproxen Itching, Swelling and Rash  . Nsaids Itching, Swelling and Rash    Can take advil  . Penicillins Itching, Swelling and Rash  .  Septra [Bactrim] Itching, Swelling and Rash  . Sulfonamide Derivatives Itching, Swelling and Rash      Review of Systems: See HPI for pertinent ROS. All other ROS negative.    Physical Exam: Blood pressure 134/70, pulse 72, temperature 98.1 F (36.7 C), temperature source Oral, resp. rate 18, weight 159 lb (72.122 kg)., Body mass index is 30.06 kg/(m^2). General:  WNWD WF. Appears in no acute distress. HEENT: Normocephalic, atraumatic, eyes without discharge, sclera non-icteric, nares are without discharge. Bilateral auditory canals clear. Bilateral TMs with scar. However, right TM has similar appearance as left TM and has no erythema, no dullness, no fluid.  Oral cavity moist, posterior pharynx without exudate, erythema, peritonsillar abscess. Severe tenderness with palpation of the right temporo-mandibular joint. No tenderness with palpation of the left temporomandibular joint.  Neck: Supple. No thyromegaly. No lymphadenopathy. No tenderness with palpation of the neck, including the right side of the neck. Lungs: Clear bilaterally to auscultation without wheezes, rales, or rhonchi. Breathing is unlabored. Heart: Regular rhythm. No murmurs, rubs, or gallops. Msk:  Strength and tone normal for age. Extremities/Skin: Warm and dry. Neuro: Alert and oriented X 3. Moves all extremities spontaneously. Gait is normal. CNII-XII  grossly in tact. Psych:  Responds to questions appropriately with a normal affect.     ASSESSMENT AND PLAN:  69 y.o. year old female with  1. Arthralgia of right temporomandibular joint I wrote down the following instructions for her to follow: Eat soft foods. Avoid hard crunchy foods. Avoid taking large bites of food or opening the mouth wide--- such as large sandwiches or burgers. Take an over-the-counter anti-inflammatory on a routine basis for 1-2 weeks. If symptoms do not resolve then she will need to follow-up with her dentist and possibly a TMJ  specialist.   Signed, Karis Juba, [place], [facility] 04/11/2015 11:44 AM

## 2015-05-04 ENCOUNTER — Telehealth: Payer: Self-pay | Admitting: Family Medicine

## 2015-05-04 MED ORDER — PROMETHAZINE HCL 25 MG PO TABS: 25.0000 mg | ORAL_TABLET | Freq: Three times a day (TID) | ORAL | Status: DC | PRN

## 2015-05-04 NOTE — Telephone Encounter (Signed)
Phenergan 25 q 8 hrs prn vomiting (30)

## 2015-05-04 NOTE — Telephone Encounter (Signed)
PATIENT IS CALLING TO SAY THAT SHE HAS A STOMACH VIRUS, SHE HAS TAKEN SOMETHING FOR THE DIARRHEA AND IT IS BETTER WOULD LIKE TO KNOW IF SOMETHING CAN BE CALLED IN FOR NAUSEA IF POSSIBLE  [facility]   [phone] (H)

## 2015-05-04 NOTE — Telephone Encounter (Signed)
Left pt message about RX

## 2015-05-11 DIAGNOSIS — R634 Abnormal weight loss: Secondary | ICD-10-CM | POA: Diagnosis not present

## 2015-05-11 DIAGNOSIS — R110 Nausea: Secondary | ICD-10-CM | POA: Diagnosis not present

## 2015-05-27 ENCOUNTER — Telehealth: Payer: Self-pay | Admitting: Family Medicine

## 2015-05-27 NOTE — Telephone Encounter (Signed)
Patient would like rx for her hydrocodone  [phone]

## 2015-05-27 NOTE — Telephone Encounter (Signed)
ok 

## 2015-05-27 NOTE — Telephone Encounter (Signed)
?   OK to Refill  

## 2015-05-30 MED ORDER — HYDROCODONE-ACETAMINOPHEN 5-325 MG PO TABS: 1.0000 | ORAL_TABLET | Freq: Four times a day (QID) | ORAL | Status: DC | PRN

## 2015-05-30 NOTE — Telephone Encounter (Signed)
RX printed, left up front and patient aware to pick up via vm 

## 2015-05-30 NOTE — Telephone Encounter (Signed)
RX printed, left up front and patient aware to pick up  

## 2015-06-25 ENCOUNTER — Other Ambulatory Visit: Payer: Self-pay | Admitting: Family Medicine

## 2015-07-19 DIAGNOSIS — Z6829 Body mass index (BMI) 29.0-29.9, adult: Secondary | ICD-10-CM | POA: Diagnosis not present

## 2015-07-19 DIAGNOSIS — Z124 Encounter for screening for malignant neoplasm of cervix: Secondary | ICD-10-CM | POA: Diagnosis not present

## 2015-08-02 ENCOUNTER — Ambulatory Visit (INDEPENDENT_AMBULATORY_CARE_PROVIDER_SITE_OTHER): Payer: Medicare Other | Admitting: Family Medicine

## 2015-08-02 ENCOUNTER — Encounter: Payer: Self-pay | Admitting: Family Medicine

## 2015-08-02 VITALS — BP 132/64 | HR 78 | Temp 98.0°F | Resp 16 | Ht 61.0 in | Wt 156.0 lb

## 2015-08-02 DIAGNOSIS — J019 Acute sinusitis, unspecified: Secondary | ICD-10-CM

## 2015-08-02 MED ORDER — AZITHROMYCIN 250 MG PO TABS: ORAL_TABLET | ORAL | Status: DC

## 2015-08-02 NOTE — Progress Notes (Signed)
Subjective:    Patient ID: [name], female    DOB: 11-20-1945, 70 y.o.   MRN: [id number]  HPI  reports 2 weeks of head congestion, sinus congestion, rhinorrhea, postnasal drip. Her jaw and teeth 8. Her cheekbones 8. She has a dull constant headache. She is tried Zyrtec and decongestants without any relief. She reports subjective fevers at home. Past Medical History  Diagnosis Date  . Hyperlipidemia   . Hypertension   . Depression   . Tobacco user   . History of skin cancer   . Allergic rhinitis   . Cancer ([place])   . CAD (coronary artery disease)   . Emphysema     no longer uses inhalers  . Shortness of breath     sob if walks up flight of stairs  . Gallstones     nausea, pain upper right abdomen  . H/O hiatal hernia   . GERD (gastroesophageal reflux disease)   . Complication of anesthesia     sore throat after a surgery  . Asymptomatic bilateral carotid artery stenosis   . Wears dentures     top   Past Surgical History  Procedure Laterality Date  . Tubal ligation    . Tonsilectomy, adenoidectomy, bilateral myringotomy and tubes    . Cholecystectomy  07/08/2012    Procedure: LAPAROSCOPIC CHOLECYSTECTOMY WITH INTRAOPERATIVE CHOLANGIOGRAM;  Surgeon: Gayland Curry, MD,FACS;  Location: [facility];  Service: General;  Laterality: N/A;  Laparoscopic Cholecystectomy with Intraoperative Cholangiogram  . Colonoscopy     Current Outpatient Prescriptions on File Prior to Visit  Medication Sig Dispense Refill  . anastrozole (ARIMIDEX) 1 MG tablet Take 1 tablet (1 mg total) by mouth daily. 90 tablet 4  . aspirin 81 MG tablet Take 81 mg by mouth daily.    . Cholecalciferol (VITAMIN D3) 5000 UNITS CAPS Take 5,000 Units by mouth daily.    . Coenzyme Q10 (CO Q 10) 100 MG CAPS Take by mouth.    . cyanocobalamin 2000 MCG tablet Take 2,000 mcg by mouth daily. B12 slow release tablet daily    . escitalopram (LEXAPRO) 20 MG tablet Take 20 mg by mouth daily.    Marland Kitchen esomeprazole (NEXIUM) 40 MG  capsule Take 40 mg by mouth daily at 12 noon.    Marland Kitchen HYDROcodone-acetaminophen (NORCO) 5-325 MG tablet Take 1 tablet by mouth every 6 (six) hours as needed for moderate pain. 30 tablet 0  . losartan (COZAAR) 50 MG tablet TAKE 1 TABLET EVERY DAY 90 tablet 3  . Multiple Vitamin (MULTIVITAMIN) tablet Take 1 tablet by mouth daily.    . NOREL AD 4-10-325 MG TABS TAKE 1 TABLET BY MOUTH 4 (FOUR) TIMES DAILY AS NEEDED.  2  . promethazine (PHENERGAN) 25 MG tablet Take 1 tablet (25 mg total) by mouth every 8 (eight) hours as needed for nausea or vomiting. 30 tablet 0   No current facility-administered medications on file prior to visit.   Allergies  Allergen Reactions  . Latex   . Cefaclor Itching, Swelling and Rash  . Metronidazole Itching, Swelling and Rash  . Naproxen Itching, Swelling and Rash  . Nsaids Itching, Swelling and Rash    Can take advil  . Penicillins Itching, Swelling and Rash  . Septra [Bactrim] Itching, Swelling and Rash  . Sulfonamide Derivatives Itching, Swelling and Rash   Social History   Social History  . Marital Status: Married    Spouse Name: N/A  . Number of Children: 1  . Years  of Education: N/A   Occupational History  . 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     Social History Main Topics  . Smoking status: Former Smoker -- 0.50 packs/day for 40 years    Types: Cigarettes    Quit date: 02/12/2013  . Smokeless tobacco: Never Used  . Alcohol Use: No  . Drug Use: No  . Sexual Activity: Not on file   Other Topics Concern  . Not on file   Social History Narrative     Review of Systems  All other systems reviewed and are negative.      Objective:   Physical Exam  Constitutional: She appears well-developed and well-nourished.  HENT:  Head: Normocephalic and atraumatic.  Right Ear: External ear normal.  Left Ear: External ear normal.  Nose: Mucosal edema and rhinorrhea present. Right sinus exhibits maxillary sinus tenderness and frontal sinus tenderness.  Left sinus exhibits maxillary sinus tenderness and frontal sinus tenderness.  Mouth/Throat: Oropharynx is clear and moist. No oropharyngeal exudate.  Eyes: Conjunctivae and EOM are normal. Pupils are equal, round, and reactive to light.  Neck: Neck supple.  Cardiovascular: Normal rate, regular rhythm and normal heart sounds.   No murmur heard. Pulmonary/Chest: Effort normal and breath sounds normal. No respiratory distress. She has no wheezes. She has no rales. She exhibits no tenderness.  Abdominal: Soft. Bowel sounds are normal.  Lymphadenopathy:    She has no cervical adenopathy.  Vitals reviewed.         Assessment & Plan:  Acute rhinosinusitis - Plan: azithromycin (ZITHROMAX) 250 MG tablet   dear allergy profile, we will try treating the patient with a Z-Pak. Also recommended nasal saline 4 times a day for the next week.

## 2015-08-15 DIAGNOSIS — Z853 Personal history of malignant neoplasm of breast: Secondary | ICD-10-CM | POA: Diagnosis not present

## 2015-08-15 DIAGNOSIS — I890 Lymphedema, not elsewhere classified: Secondary | ICD-10-CM | POA: Diagnosis not present

## 2015-08-18 ENCOUNTER — Encounter: Payer: Self-pay | Admitting: Family Medicine

## 2015-08-18 ENCOUNTER — Ambulatory Visit (INDEPENDENT_AMBULATORY_CARE_PROVIDER_SITE_OTHER): Payer: Medicare Other | Admitting: Family Medicine

## 2015-08-18 VITALS — BP 130/68 | HR 80 | Temp 98.1°F | Resp 16 | Wt 159.0 lb

## 2015-08-18 DIAGNOSIS — H6504 Acute serous otitis media, recurrent, right ear: Secondary | ICD-10-CM | POA: Diagnosis not present

## 2015-08-18 MED ORDER — NEOMYCIN-POLYMYXIN-HC 3.5-10000-1 OT SOLN: 3.0000 [drp] | Freq: Four times a day (QID) | OTIC | Status: DC

## 2015-08-18 MED ORDER — LEVOFLOXACIN 500 MG PO TABS: 500.0000 mg | ORAL_TABLET | Freq: Every day | ORAL | Status: DC

## 2015-08-18 NOTE — Progress Notes (Signed)
Subjective:    Patient ID: Brittany Kirk, female    DOB: January 16, 1946, 70 y.o.   MRN: [id number]  HPI 08/02/15  reports 2 weeks of head congestion, sinus congestion, rhinorrhea, postnasal drip. Her jaw and teeth hurt. Her cheekbones hurt. She has a dull constant headache. She has tried Zyrtec and decongestants without any relief. She reports subjective fevers at home.  At that time, my plan was: Due to her allergy profile, we will try treating the patient with a Z-Pak. Also recommended nasal saline 4 times a day for the next week.  08/18/15 Symptoms are no better. In fact there worse. She is now hurting in her right ear. She also complains of pain in her right throat. When I examine oropharynx is no erythema in the throat. There is no peritonsillar abscess. There is no lymphadenopathy in the neck. However she does have a purulent effusion in her right ear in the right tympanic membrane is erythematous and bulging. Past Medical History  Diagnosis Date  . Hyperlipidemia   . Hypertension   . Depression   . Tobacco user   . History of skin cancer   . Allergic rhinitis   . Cancer ([place])   . CAD (coronary artery disease)   . Emphysema     no longer uses inhalers  . Shortness of breath     sob if walks up flight of stairs  . Gallstones     nausea, pain upper right abdomen  . H/O hiatal hernia   . GERD (gastroesophageal reflux disease)   . Complication of anesthesia     sore throat after a surgery  . Asymptomatic bilateral carotid artery stenosis   . Wears dentures     top   Past Surgical History  Procedure Laterality Date  . Tubal ligation    . Tonsilectomy, adenoidectomy, bilateral myringotomy and tubes    . Cholecystectomy  07/08/2012    Procedure: LAPAROSCOPIC CHOLECYSTECTOMY WITH INTRAOPERATIVE CHOLANGIOGRAM;  Surgeon: Gayland Curry, MD,FACS;  Location: [facility];  Service: General;  Laterality: N/A;  Laparoscopic Cholecystectomy with Intraoperative Cholangiogram  . Colonoscopy      Current Outpatient Prescriptions on File Prior to Visit  Medication Sig Dispense Refill  . anastrozole (ARIMIDEX) 1 MG tablet Take 1 tablet (1 mg total) by mouth daily. 90 tablet 4  . aspirin 81 MG tablet Take 81 mg by mouth daily.    Marland Kitchen azithromycin (ZITHROMAX) 250 MG tablet 2 tabs poqday1, 1 tab poqday 2-5 6 tablet 0  . Cholecalciferol (VITAMIN D3) 5000 UNITS CAPS Take 5,000 Units by mouth daily.    . cyanocobalamin 2000 MCG tablet Take 2,000 mcg by mouth daily. B12 slow release tablet daily    . diazepam (VALIUM) 5 MG tablet TAKE 1 TABLET BY MOUTH AT BEDTIME AS NEEDED FOR ANXIETY  3  . escitalopram (LEXAPRO) 20 MG tablet Take 20 mg by mouth daily.    Marland Kitchen esomeprazole (NEXIUM) 40 MG capsule Take 40 mg by mouth daily at 12 noon.    Marland Kitchen HYDROcodone-acetaminophen (NORCO) 5-325 MG tablet Take 1 tablet by mouth every 6 (six) hours as needed for moderate pain. 30 tablet 0  . losartan (COZAAR) 50 MG tablet TAKE 1 TABLET EVERY DAY 90 tablet 3  . Multiple Vitamin (MULTIVITAMIN) tablet Take 1 tablet by mouth daily.    Renda Rolls AD 4-10-325 MG TABS Reported on 08/02/2015  2   No current facility-administered medications on file prior to visit.   Allergies  Allergen Reactions  .  Latex   . Cefaclor Itching, Swelling and Rash  . Metronidazole Itching, Swelling and Rash  . Naproxen Itching, Swelling and Rash  . Nsaids Itching, Swelling and Rash    Can take advil  . Penicillins Itching, Swelling and Rash  . Septra [Bactrim] Itching, Swelling and Rash  . Sulfonamide Derivatives Itching, Swelling and Rash   Social History   Social History  . Marital Status: Married    Spouse Name: N/A  . Number of Children: 1  . Years of Education: N/A   Occupational History  . 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     Social History Main Topics  . Smoking status: Former Smoker -- 0.50 packs/day for 40 years    Types: Cigarettes    Quit date: 02/12/2013  . Smokeless tobacco: Never Used  . Alcohol Use: No  . Drug  Use: No  . Sexual Activity: Not on file   Other Topics Concern  . Not on file   Social History Narrative     Review of Systems  All other systems reviewed and are negative.      Objective:   Physical Exam  Constitutional: She appears well-developed and well-nourished.  HENT:  Head: Normocephalic and atraumatic.  Right Ear: External ear normal.  Left Ear: External ear normal.  Nose: Mucosal edema and rhinorrhea present. Right sinus exhibits maxillary sinus tenderness and frontal sinus tenderness. Left sinus exhibits maxillary sinus tenderness and frontal sinus tenderness.  Mouth/Throat: Oropharynx is clear and moist. No oropharyngeal exudate.  Eyes: Conjunctivae and EOM are normal. Pupils are equal, round, and reactive to light.  Neck: Neck supple.  Cardiovascular: Normal rate, regular rhythm and normal heart sounds.   No murmur heard. Pulmonary/Chest: Effort normal and breath sounds normal. No respiratory distress. She has no wheezes. She has no rales. She exhibits no tenderness.  Abdominal: Soft. Bowel sounds are normal.  Lymphadenopathy:    She has no cervical adenopathy.  Vitals reviewed.  Right tympanic membrane is bulging with purulent effusion and erythema.       Assessment & Plan:  Recurrent acute serous otitis media of right ear - Plan: levofloxacin (LEVAQUIN) 500 MG tablet, neomycin-polymyxin-hydrocortisone (CORTISPORIN) otic solution  Begin Levaquin 500 mg by mouth daily for 7 days for sinusitis along with right otitis media. I will also give the patient Cortisporin otic. Hopefully this will provide some topical relief to the swollen erythematous and painful tympanic membrane.

## 2015-08-23 ENCOUNTER — Ambulatory Visit: Payer: Medicare Other | Admitting: Physical Therapy

## 2015-08-31 ENCOUNTER — Ambulatory Visit: Payer: Medicare Other | Attending: General Surgery | Admitting: Physical Therapy

## 2015-08-31 DIAGNOSIS — I890 Lymphedema, not elsewhere classified: Secondary | ICD-10-CM | POA: Insufficient documentation

## 2015-08-31 DIAGNOSIS — R29898 Other symptoms and signs involving the musculoskeletal system: Secondary | ICD-10-CM | POA: Diagnosis not present

## 2015-08-31 NOTE — Therapy (Signed)
[place], [place], [address] Phone: [phone]   Fax:  [phone]  Physical Therapy Evaluation  Patient Details  Name: Brittany Kirk MRN: [id number] Date of Birth: 06/15/1945 Referring Provider: Dr. [name]  Encounter Date: 08/31/2015      PT End of Session - 08/31/15 1531    Visit Number 1   Number of Visits 2  to 3 if needed   Date for PT Re-Evaluation 09/30/15   PT Start Time [id number]   PT Stop Time 1524   PT Time Calculation (min) 50 min   Activity Tolerance Patient tolerated treatment well   Behavior During Therapy [facility] for tasks assessed/performed      Past Medical History  Diagnosis Date  . Hyperlipidemia   . Hypertension   . Depression   . Tobacco user   . History of skin cancer   . Allergic rhinitis   . Cancer ([place])   . CAD (coronary artery disease)   . Emphysema     no longer uses inhalers  . Shortness of breath     sob if walks up flight of stairs  . Gallstones     nausea, pain upper right abdomen  . H/O hiatal hernia   . GERD (gastroesophageal reflux disease)   . Complication of anesthesia     sore throat after a surgery  . Asymptomatic bilateral carotid artery stenosis   . Wears dentures     top    Past Surgical History  Procedure Laterality Date  . Tubal ligation    . Tonsilectomy, adenoidectomy, bilateral myringotomy and tubes    . Cholecystectomy  07/08/2012    Procedure: LAPAROSCOPIC CHOLECYSTECTOMY WITH INTRAOPERATIVE CHOLANGIOGRAM;  Surgeon: Gayland Curry, MD,FACS;  Location: [facility];  Service: General;  Laterality: N/A;  Laparoscopic Cholecystectomy with Intraoperative Cholangiogram  . Colonoscopy      There were no vitals filed for this visit.  Visit Diagnosis:  Lymphedema - Plan: PT plan of care cert/re-cert      Subjective Assessment - 08/31/15 1441    Subjective Swelling in right arm; only has arm pain when she makes a pound cake and holds  the mixer for a long time.  Yolanda Bonine was sick with aplastic anemia;had a bone marrow transplant at [facility] and so patient was helping him and not doing the massage.  That started in October 2015.  He is doing great now.   Pertinent History In September 2015 (02/19/14), had right lumpectomy and ALND (19 nodes).  Right arm swelling started pretty soon after surgery; she has been seen here before and has done manual lymph drainage but has not had a compression sleeve.  No other treatment except now on Arimidex.  HTN controlled with meds.     Patient Stated Goals Wants to get a sleeve for day- and nighttime.     Currently in Pain? No/denies            [facility] PT Assessment - 08/31/15 0001    Assessment   Medical Diagnosis right breast cancer, right UE lymphedema   Referring Provider Dr. [name]   Onset Date/Surgical Date 02/19/14   Precautions   Precautions Other (comment)   Precaution Comments cancer precautions   Restrictions   Weight Bearing Restrictions No   Balance Screen   Has the patient fallen in the past 6 months No   Has the patient had a decrease in activity level because of a fear of falling?  No  Is the patient reluctant to leave their home because of a fear of falling?  No   Home Environment   Living Environment Private residence   Living Arrangements Spouse/significant other   Home Layout Two level;Able to live on main level with bedroom/bathroom  doesn't go upstairs often   Prior Function   Level of Independence Independent  husband carried vacuum upstairs   Vocation Retired   Leisure rides stationary about 10 minutes a day,most days of the week   Cognition   Overall Cognitive Status Within Functional Limits for tasks assessed   Observation/Other Assessments   Other Surveys  --  lymphedema life impact scale score is 17 = 25% impairment   ROM / Strength   AROM / PROM / Strength AROM   AROM   Overall AROM Comments both shoulders grossly WFL; strength not tested  but patient denies problems with this   Ambulation/Gait   Ambulation/Gait Yes   Ambulation/Gait Assistance 7: Independent           LYMPHEDEMA/ONCOLOGY QUESTIONNAIRE - 08/31/15 1455    Type   Cancer Type right breast with lumpectomy and ALND   Surgeries   Lumpectomy Date 02/19/14   Axillary Lymph Node Dissection Date 02/19/14   Number Lymph Nodes Removed 19   Treatment   Past Chemotherapy Treatment No   Past Radiation Treatment No   What other symptoms do you have   Are you having pitting edema No   Do you have infections No   [place] Sign No   Lymphedema Assessments   Lymphedema Assessments Upper extremities   Right Upper Extremity Lymphedema   15 cm Proximal to Olecranon Process 33.5 cm   10 cm Proximal to Olecranon Process 34.9 cm   Olecranon Process 31.2 cm   15 cm Proximal to Ulnar Styloid Process 31.7 cm   10 cm Proximal to Ulnar Styloid Process 26.9 cm   Just Proximal to Ulnar Styloid Process 19.3 cm   Across Hand at [employer] 17.8 cm   At [place] of 2nd Digit 6.1 cm   Left Upper Extremity Lymphedema   15 cm Proximal to Olecranon Process 31.7 cm   10 cm Proximal to Olecranon Process 31.6 cm   Olecranon Process 25.7 cm   15 cm Proximal to Ulnar Styloid Process 24.8 cm   10 cm Proximal to Ulnar Styloid Process 22.6 cm   Just Proximal to Ulnar Styloid Process 16.8 cm   Across Hand at [employer] 17.3 cm   At [place] of 2nd Digit 6 cm                [facility] Adult PT Treatment/Exercise - 08/31/15 0001    Self-Care   Self-Care Other Self-Care Comments   Other Self-Care Comments  Pt. demonstrates/discusses self-manual lymph drainage, but is doing this somewhat incorrectly.  She is willing to come in for review.  Discussed treatment options, then discussed day- and nighttime compression garments and gave patient information about several options, suggesting she research some of this on the web before going to [place], where she chooses to be fitted.                 PT Education - 08/31/15 1531    Education provided Yes   Education Details about day- and nighttime compression garments, where and how to obtain   Person(s) Educated Patient   Methods Explanation;Handout   Comprehension Verbalized understanding  [place] - 03-Sep-2015 1538    CC Long Term Goal  #1   Title Patient will be independent in correctly performing self-manual lymph drainage for the right UE.   Time 2   Period Weeks   Status New   CC Long Term Goal  #2   Title Patient will have been to [facility] (or have made an appointment there) to be fitted for garments.   Time 2   Period Weeks   Status New            Plan - 09-03-15 1532    Clinical Impression Statement Patient with signficant right UE swelling who learned manual lymph drainage in the past but seems to be doing it incorrectly.  She is willing to come in one time for review of technique. She does not have a day- nor night time compression garment and does want to obtain those.  She does not choose bandaging at this time, despite being educated about its benefit for reducing swelling.   Pt will benefit from skilled therapeutic intervention in order to improve on the following deficits Increased edema;Decreased knowledge of precautions;Decreased knowledge of use of DME   Rehab Potential Good   Clinical Impairments Affecting Rehab Potential Patient not interested in doing much therapy.   PT Frequency --  1 time to 2 times total if needed   PT Duration 2 weeks   PT Treatment/Interventions ADLs/Self Care Home Management;DME Instruction;Patient/family education;Manual techniques;Manual lymph drainage   PT Next Visit Plan Review and correct patient doing self-manual lymph drainage for right UE.   Recommended Other Services Patient plans to do some web research and go to [facility] to get day- and nighttime compression garments.   Consulted and Agree with  Plan of Care Patient          G-Codes - 09-03-2015 1540    Functional Assessment Tool Used lymphedema life impact scale   Functional Limitation Self care   Self Care Current Status [phone]) At least 20 percent but less than 40 percent impaired, limited or restricted   Self Care Goal Status [id number]) At least 20 percent but less than 40 percent impaired, limited or restricted       Problem List Patient Active Problem List   Diagnosis Date Noted  . Osteoporosis 02/22/2015  . CKD (chronic kidney disease), stage III 08/16/2014  . Breast cancer of upper-outer quadrant of right female breast ([place]) 03/22/2014  . Breast cancer-right s/p right partial mastectomy and right axillary LND 02/19/14 02/03/2014  . Dizziness and giddiness 11/27/2012  . Essential tremor 11/27/2012  . Numbness 11/27/2012  . TOBACCO USER 10/04/2009  . CLOSED FRACTURE OF ONE RIB 07/12/2009  . HYPERLIPIDEMIA TYPE IIB / III 09/28/2008  . CAD, NATIVE VESSEL 09/28/2008  . ALLERGIC RHINITIS 09/09/2008  . EMPHYSEMA 09/07/2008  . HYPERTENSION 09/06/2008  . SKIN CANCER, HX OF 09/06/2008    SALISBURY,DONNA 2015-09-03, 3:47 PM  [place], [place], [address] Phone: [phone]   Fax:  [phone]  Name: Brittany Kirk MRN: [id number] Date of Birth: [birth year]   Serafina Royals, PT 03-Sep-2015 3:47 PM

## 2015-09-06 ENCOUNTER — Ambulatory Visit: Payer: Medicare Other | Admitting: Physical Therapy

## 2015-09-06 DIAGNOSIS — I890 Lymphedema, not elsewhere classified: Secondary | ICD-10-CM

## 2015-09-06 DIAGNOSIS — R29898 Other symptoms and signs involving the musculoskeletal system: Secondary | ICD-10-CM

## 2015-09-06 NOTE — Therapy (Signed)
[place], [place], [address] Phone: [phone]   Fax:  [phone]  Physical Therapy Treatment  Patient Details  Name: Brittany Kirk MRN: [id number] Date of Birth: Jul 19, 1945 Referring Provider: Dr. [name]  Encounter Date: 09/06/2015      PT End of Session - 09/06/15 1210    Visit Number 2   Date for PT Re-Evaluation 09/30/15   PT Start Time 1105   PT Stop Time 1145   PT Time Calculation (min) 40 min   Activity Tolerance Patient tolerated treatment well   Behavior During Therapy [facility] for tasks assessed/performed      Past Medical History  Diagnosis Date  . Hyperlipidemia   . Hypertension   . Depression   . Tobacco user   . History of skin cancer   . Allergic rhinitis   . Cancer ([place])   . CAD (coronary artery disease)   . Emphysema     no longer uses inhalers  . Shortness of breath     sob if walks up flight of stairs  . Gallstones     nausea, pain upper right abdomen  . H/O hiatal hernia   . GERD (gastroesophageal reflux disease)   . Complication of anesthesia     sore throat after a surgery  . Asymptomatic bilateral carotid artery stenosis   . Wears dentures     top    Past Surgical History  Procedure Laterality Date  . Tubal ligation    . Tonsilectomy, adenoidectomy, bilateral myringotomy and tubes    . Cholecystectomy  07/08/2012    Procedure: LAPAROSCOPIC CHOLECYSTECTOMY WITH INTRAOPERATIVE CHOLANGIOGRAM;  Surgeon: Gayland Curry, MD,FACS;  Location: [facility];  Service: General;  Laterality: N/A;  Laparoscopic Cholecystectomy with Intraoperative Cholangiogram  . Colonoscopy      There were no vitals filed for this visit.  Visit Diagnosis:  Lymphedema  Shoulder weakness      Subjective Assessment - 09/06/15 1203    Subjective Pt remebered she had a deep cut in right forearm about a month ago that she took care of on her own.  She was on antibiotics at the time  for a double ear infection and it healed up.  She noticed the swelling after that. She plans to get a nighime sleeve online and then a daytime sleeve to see if she can ge it to come down some.    Pertinent History In September 2015 (02/19/14), had right lumpectomy and ALND (19 nodes).  Right arm swelling started pretty soon after surgery; she has been seen here before and has done manual lymph drainage but has not had a compression sleeve.  No other treatment except now on Arimidex.  HTN controlled with meds.     Patient Stated Goals Wants to get a sleeve for day- and nighttime.     Currently in Pain? No/denies                         [facility] Adult PT Treatment/Exercise - 09/06/15 0001    Self-Care   Other Self-Care Comments  issued medium tg soft with instruction to keep it folded down over elbow . Offerred Celine Ahr training DVD, but patient declined    Manual Therapy   Manual Therapy Manual Lymphatic Drainage (MLD)   Manual Lymphatic Drainage (MLD) Reviewed and performed manual lymph drainage in supine;  short neck, superficial and deep abdominals, left axilla, anterior interaxiallary anastamosis, left arm with  extra time spent on forearm and elbow with instruction in forearm supination and elbow extenstion with arm elevated toward ceiling.                 PT Education - 09/06/15 1221    Education provided Yes   Education Details use Tg soft for comfort only.  Ok to wear at night if provides comfort. make sure it stays folded and does not roll  Elevate and exercise arm.    Person(s) Educated Patient   Methods Explanation   Comprehension Verbalized understanding                [facility] Goals - 08/31/15 1538    CC Long Term Goal  #1   Title Patient will be independent in correctly performing self-manual lymph drainage for the right UE.   Time 2   Period Weeks   Status New   CC Long Term Goal  #2   Title Patient will have been to [facility] (or  have made an appointment there) to be fitted for garments.   Time 2   Period Weeks   Status New            Plan - 09/06/15 1223    Clinical Impression Statement Reiforced elevation, exercise and manual lymph drainage especailly diaphragmatic breathing, left axillary node stimulation and anterior interaxillary anastaomsis.  Pt wants to try getting a nighttime sleeve online as the prices are better and then will get a daytime sleeve once she has reduced some. She is hopeful the tg soft will her her some. She will call back in a few weeks if she continues to have trouble and needs more assist    Pt will benefit from skilled therapeutic intervention in order to improve on the following deficits Increased edema;Decreased knowledge of precautions;Decreased knowledge of use of DME   Rehab Potential Good   Clinical Impairments Affecting Rehab Potential Patient not interested in doing much therapy.  10 nodes removed    PT Next Visit Plan Review and correct patient doing self-manual lymph drainage for right UE and assist with getting garments as she requests assist    Consulted and Agree with Plan of Care Patient        Problem List Patient Active Problem List   Diagnosis Date Noted  . Osteoporosis 02/22/2015  . CKD (chronic kidney disease), stage III 08/16/2014  . Breast cancer of upper-outer quadrant of right female breast ([place]) 03/22/2014  . Breast cancer-right s/p right partial mastectomy and right axillary LND 02/19/14 02/03/2014  . Dizziness and giddiness 11/27/2012  . Essential tremor 11/27/2012  . Numbness 11/27/2012  . TOBACCO USER 10/04/2009  . CLOSED FRACTURE OF ONE RIB 07/12/2009  . HYPERLIPIDEMIA TYPE IIB / III 09/28/2008  . CAD, NATIVE VESSEL 09/28/2008  . ALLERGIC RHINITIS 09/09/2008  . EMPHYSEMA 09/07/2008  . HYPERTENSION 09/06/2008  . SKIN CANCER, HX OF 09/06/2008   Donato Heinz. Owens Shark, PT  09/06/2015, 12:28 PM  [place], [place], [address] Phone: [phone]   Fax:  [phone]  Name: Brittany Kirk MRN: [id number] Date of Birth: [birth year]

## 2015-09-28 ENCOUNTER — Ambulatory Visit: Payer: Medicare Other | Attending: General Surgery | Admitting: Physical Therapy

## 2015-09-28 DIAGNOSIS — I890 Lymphedema, not elsewhere classified: Secondary | ICD-10-CM | POA: Diagnosis not present

## 2015-09-28 NOTE — Therapy (Signed)
[place], [place], [address] Phone: [phone]   Fax:  [phone]  Physical Therapy Treatment  Patient Details  Name: Brittany Kirk MRN: [id number] Date of Birth: 03/12/1946 Referring Provider: Dr. [name]  Encounter Date: 09/28/2015      PT End of Session - 09/28/15 1305    Visit Number 3   Number of Visits 13   Date for PT Re-Evaluation 10/28/15   PT Start Time 0845   PT Stop Time 0932   PT Time Calculation (min) 47 min   Activity Tolerance Patient tolerated treatment well   Behavior During Therapy [facility] for tasks assessed/performed      Past Medical History  Diagnosis Date  . Hyperlipidemia   . Hypertension   . Depression   . Tobacco user   . History of skin cancer   . Allergic rhinitis   . Cancer ([place])   . CAD (coronary artery disease)   . Emphysema     no longer uses inhalers  . Shortness of breath     sob if walks up flight of stairs  . Gallstones     nausea, pain upper right abdomen  . H/O hiatal hernia   . GERD (gastroesophageal reflux disease)   . Complication of anesthesia     sore throat after a surgery  . Asymptomatic bilateral carotid artery stenosis   . Wears dentures     top    Past Surgical History  Procedure Laterality Date  . Tubal ligation    . Tonsilectomy, adenoidectomy, bilateral myringotomy and tubes    . Cholecystectomy  07/08/2012    Procedure: LAPAROSCOPIC CHOLECYSTECTOMY WITH INTRAOPERATIVE CHOLANGIOGRAM;  Surgeon: Gayland Curry, MD,FACS;  Location: [facility];  Service: General;  Laterality: N/A;  Laparoscopic Cholecystectomy with Intraoperative Cholangiogram  . Colonoscopy      There were no vitals filed for this visit.      Subjective Assessment - 09/28/15 0849    Subjective pt is having trouble ordering her garment online and wants some help in ordering.  She also fell  the other day and feels that she has fullness on  right arm and  forearm   She is now agreeable to coming for more sessions of PT to get her arm swelling under control and get the garments she needs for maintenance.    Pertinent History In September 2015 (02/19/14), had right lumpectomy and ALND (19 nodes).  Right arm swelling started pretty soon after surgery; she has been seen here before and has done manual lymph drainage but has not had a compression sleeve.  No other treatment except now on Arimidex.  HTN controlled with meds.     Patient Stated Goals Wants to get a sleeve for day- and nighttime.     Currently in Pain? Yes   Pain Score --  did note but indicates it is a  litte sore    Pain Location Arm   Pain Orientation Right;Upper   Pain Descriptors / Indicators Sore   Pain Type Acute pain   Pain Radiating Towards axilla    Pain Onset In the past 7 days   Pain Relieving Factors elevating arm             [facility] PT Assessment - 09/28/15 0001    Observation/Other Assessments   Observations obvious edema at right forearm, elbow and upper arm with some slight pinkness at medial side, no warmness or exquisite tenderness to  touch.  hand does not appear to have edema            LYMPHEDEMA/ONCOLOGY QUESTIONNAIRE - 09/28/15 0857    Right Upper Extremity Lymphedema   15 cm Proximal to Olecranon Process 37.5 cm   10 cm Proximal to Olecranon Process 39 cm   Olecranon Process 31.8 cm   15 cm Proximal to Ulnar Styloid Process 33.5 cm   10 cm Proximal to Ulnar Styloid Process 29 cm   Just Proximal to Ulnar Styloid Process 19 cm   Across Hand at [employer] 18.2 cm   At [place] of 2nd Digit 6 cm                  [facility] Adult PT Treatment/Exercise - 09/28/15 0001    Self-Care   Other Self-Care Comments  issued small tg soft to lower arm and medium to upper arm with overlap around elbow to provided gentle compression to edematous area    Manual Therapy   Manual Lymphatic Drainage (MLD) In supine, short neck, superficial and deep  abdominals. right inquinal nodes and axillo inguinal anastamosis, left axillary nodes and anterior interaxillary anastamosis, left shoulder upper arm, elbow and forearm with return along pathways. then to sidelying for posterior interaxillary anasamosis, upper back and lateral trunk                 PT Education - 09/28/15 1257    Education provided Yes   Education Details use Tg soft for comfort only,  make sure it does not roll or "bind" on arm    Person(s) Educated Patient   Methods Explanation   Comprehension Verbalized understanding                [place] - 09/28/15 1321    CC Long Term Goal  #1   Title Patient will be independent in correctly performing self-manual lymph drainage for the right UE. and use of compression for self managment    Time 4   Period Weeks   Status Revised   CC Long Term Goal  #2   Title Patient will have been to [facility] (or have made an appointment there) to be fitted for garments.   Status Deferred   CC Long Term Goal  #3   Title Pt will have a reduction in right UE at 10 cm proximal to ulnar styloid of 1.5 cm    Baseline 29 on 09/28/2015   Time 4   Period Weeks   Status Revised            Plan - 09/28/15 1306    Clinical Impression Statement Pt continues to have difficutly getting control of lymphedema of right arm and has not been able to order compression garment on her own on the internet. She has had and incease in swelling after a recent fall at home and ciircumferentil measurements are increased.  There is no warmness, slight pinkness of skin and only mild pain. She received manual lymph drainage today and temporary light compression.  She has agreed to come to our clinic for a series of treatments to stablze her lymphedema and oversee acquisition of  nighttime and daytime compression garments.  She has agreed to compression bandaging as necessary  to get reduction.    Rehab Potential Good   Clinical  Impairments Affecting Rehab Potential axillary node dissection    PT Frequency 3x / week  for 2 weeks if bandages, the 2x per week  PT Duration 4 weeks   PT Treatment/Interventions ADLs/Self Care Home Management;DME Instruction;Patient/family education;Manual techniques;Manual lymph drainage;Compression bandaging;Taping;Passive range of motion;Therapeutic exercise   PT Next Visit Plan check skin, remeasure to see if there has been intital reduction.  manual lymph drainage( continue reinforcement for pt to do on her own)  and supine dowel rod flexion and remedial exericse to arm, adjust  schedule to allow for bandaging next week .  Determine which nightime garment she wants and When timig is right for pt to be measured for garment , send informatin to [facility].    Consulted and Agree with Plan of Care Patient      Patient will benefit from skilled therapeutic intervention in order to improve the following deficits and impairments:  Increased edema, Decreased knowledge of precautions, Decreased knowledge of use of DME  Visit Diagnosis: Lymphedema, not elsewhere classified - Plan: PT plan of care cert/re-cert     Problem List Patient Active Problem List   Diagnosis Date Noted  . Osteoporosis 02/22/2015  . CKD (chronic kidney disease), stage III 08/16/2014  . Breast cancer of upper-outer quadrant of right female breast ([place]) 03/22/2014  . Breast cancer-right s/p right partial mastectomy and right axillary LND 02/19/14 02/03/2014  . Dizziness and giddiness 11/27/2012  . Essential tremor 11/27/2012  . Numbness 11/27/2012  . TOBACCO USER 10/04/2009  . CLOSED FRACTURE OF ONE RIB 07/12/2009  . HYPERLIPIDEMIA TYPE IIB / III 09/28/2008  . CAD, NATIVE VESSEL 09/28/2008  . ALLERGIC RHINITIS 09/09/2008  . EMPHYSEMA 09/07/2008  . HYPERTENSION 09/06/2008  . SKIN CANCER, HX OF 09/06/2008   Donato Heinz. Owens Shark, PT  09/28/2015, 1:27 PM  [place], [place], [address] Phone: [phone]   Fax:  [phone]  Name: Brittany Kirk MRN: [id number] Date of Birth: 1945-10-16

## 2015-09-30 ENCOUNTER — Ambulatory Visit: Payer: Medicare Other | Admitting: Physical Therapy

## 2015-09-30 ENCOUNTER — Ambulatory Visit (INDEPENDENT_AMBULATORY_CARE_PROVIDER_SITE_OTHER): Payer: Medicare Other | Admitting: Family Medicine

## 2015-09-30 ENCOUNTER — Encounter: Payer: Self-pay | Admitting: Family Medicine

## 2015-09-30 VITALS — BP 160/70 | HR 72 | Temp 98.2°F | Resp 18 | Wt 164.0 lb

## 2015-09-30 DIAGNOSIS — I890 Lymphedema, not elsewhere classified: Secondary | ICD-10-CM | POA: Diagnosis not present

## 2015-09-30 DIAGNOSIS — I809 Phlebitis and thrombophlebitis of unspecified site: Secondary | ICD-10-CM

## 2015-09-30 MED ORDER — DOXYCYCLINE HYCLATE 100 MG PO TABS: 100.0000 mg | ORAL_TABLET | Freq: Two times a day (BID) | ORAL | Status: DC

## 2015-09-30 NOTE — Therapy (Signed)
[place], [place], [address] Phone: [phone]   Fax:  [phone]  Physical Therapy Treatment  Patient Details  Name: Brittany Kirk MRN: [id number] Date of Birth: 1946/04/16 Referring Provider: Dr. [name]  Encounter Date: 09/30/2015      PT End of Session - 09/30/15 0817    Visit Number 4   Number of Visits 13   Date for PT Re-Evaluation 10/28/15   PT Start Time 0800   PT Stop Time 0825  short session today due to medical complication   PT Time Calculation (min) 25 min   Activity Tolerance --  possible skin infection   Behavior During Therapy [facility] for tasks assessed/performed      Past Medical History  Diagnosis Date  . Hyperlipidemia   . Hypertension   . Depression   . Tobacco user   . History of skin cancer   . Allergic rhinitis   . Cancer ([place])   . CAD (coronary artery disease)   . Emphysema     no longer uses inhalers  . Shortness of breath     sob if walks up flight of stairs  . Gallstones     nausea, pain upper right abdomen  . H/O hiatal hernia   . GERD (gastroesophageal reflux disease)   . Complication of anesthesia     sore throat after a surgery  . Asymptomatic bilateral carotid artery stenosis   . Wears dentures     top    Past Surgical History  Procedure Laterality Date  . Tubal ligation    . Tonsilectomy, adenoidectomy, bilateral myringotomy and tubes    . Cholecystectomy  07/08/2012    Procedure: LAPAROSCOPIC CHOLECYSTECTOMY WITH INTRAOPERATIVE CHOLANGIOGRAM;  Surgeon: Gayland Curry, MD,FACS;  Location: [facility];  Service: General;  Laterality: N/A;  Laparoscopic Cholecystectomy with Intraoperative Cholangiogram  . Colonoscopy      There were no vitals filed for this visit.      Subjective Assessment - 09/30/15 0805    Subjective Has been wearing the compression stockinette (TG soft) but hand puffed up with it on.  Feels her forearm is now warm as  well as being reddened.     Currently in Pain? Yes   Pain Score 6    Pain Location Arm   Pain Orientation Right;Distal   Pain Descriptors / Indicators Sore   Aggravating Factors  raising arm up            [facility] PT Assessment - 09/30/15 0001    Observation/Other Assessments   Observations skin of right forearm, medial aspect, is reddened and slightly warm to the touch           LYMPHEDEMA/ONCOLOGY QUESTIONNAIRE - 09/30/15 0809    Right Upper Extremity Lymphedema   15 cm Proximal to Olecranon Process 34.5 cm   10 cm Proximal to Olecranon Process 36.8 cm   Olecranon Process 32.7 cm   15 cm Proximal to Ulnar Styloid Process 33.6 cm   10 cm Proximal to Ulnar Styloid Process 28.9 cm   Just Proximal to Ulnar Styloid Process 21.2 cm   Across Hand at [employer] 19.2 cm   At [place] of 2nd Digit 6.2 cm                  [facility] Adult PT Treatment/Exercise - 09/30/15 0001    Manual Therapy   Manual Therapy Edema management   Edema Management circumference measurements taken  [facility] Goals - 09/28/15 1321    CC Long Term Goal  #1   Title Patient will be independent in correctly performing self-manual lymph drainage for the right UE. and use of compression for self managment    Time 4   Period Weeks   Status Revised   CC Long Term Goal  #2   Title Patient will have been to [facility] (or have made an appointment there) to be fitted for garments.   Status Deferred   CC Long Term Goal  #3   Title Pt will have a reduction in right UE at 10 cm proximal to ulnar styloid of 1.5 cm    Baseline 29 on 09/28/2015   Time 4   Period Weeks   Status Revised            Plan - 09/30/15 [id number]    Clinical Impression Statement Patient comes in with redness at forearm today but it is  now warm to the touch, which she had noted and therapist confirmed.  Her measurements have increased at hand, wrist, and elbow today; forearm is  about the same and upper arm is actually reduced.  Patient called while here and got an appointment to see her primary care doctor today at 11:00 to determine whether she has cellulitis in the right arm.  She was told that if he does diagnose celllulitis, she should hold off on doing manual lymph drainage and cancel appointments until a week from today.  She verbalized understanding.   Rehab Potential Good   Clinical Impairments Affecting Rehab Potential axillary node dissection    PT Frequency 3x / week   PT Duration 4 weeks   PT Treatment/Interventions Manual techniques;Patient/family education;ADLs/Self Care Home Management   PT Next Visit Plan Assess skin of right forearm, remeasure, resume complete decongestive therapy if clear.   Consulted and Agree with Plan of Care Patient      Patient will benefit from skilled therapeutic intervention in order to improve the following deficits and impairments:  Increased edema, Decreased knowledge of precautions, Decreased knowledge of use of DME  Visit Diagnosis: Lymphedema, not elsewhere classified     Problem List Patient Active Problem List   Diagnosis Date Noted  . Osteoporosis 02/22/2015  . CKD (chronic kidney disease), stage III 08/16/2014  . Breast cancer of upper-outer quadrant of right female breast ([place]) 03/22/2014  . Breast cancer-right s/p right partial mastectomy and right axillary LND 02/19/14 02/03/2014  . Dizziness and giddiness 11/27/2012  . Essential tremor 11/27/2012  . Numbness 11/27/2012  . TOBACCO USER 10/04/2009  . CLOSED FRACTURE OF ONE RIB 07/12/2009  . HYPERLIPIDEMIA TYPE IIB / III 09/28/2008  . CAD, NATIVE VESSEL 09/28/2008  . ALLERGIC RHINITIS 09/09/2008  . EMPHYSEMA 09/07/2008  . HYPERTENSION 09/06/2008  . SKIN CANCER, HX OF 09/06/2008    SALISBURY,DONNA 09/30/2015, 8:32 AM  [place], [place], [address] Phone: [phone]    Fax:  [phone]  Name: Brittany Kirk MRN: [id number] Date of Birth: 1946-05-18    Serafina Royals, PT 09/30/2015 8:32 AM

## 2015-09-30 NOTE — Progress Notes (Signed)
Subjective:    Patient ID: Brittany Kirk, female    DOB: Apr 14, 1946, 70 y.o.   MRN: [id number]  HPI The patient has a history of lymphedema in her right arm. Beginning earlier this week, the area over the lateral epicondyles became extremely swollen, slightly red, slightly warm, and slightly tender. The vein in that area is tender to palpation. Past Medical History  Diagnosis Date  . Hyperlipidemia   . Hypertension   . Depression   . Tobacco user   . History of skin cancer   . Allergic rhinitis   . Cancer ([place])   . CAD (coronary artery disease)   . Emphysema     no longer uses inhalers  . Shortness of breath     sob if walks up flight of stairs  . Gallstones     nausea, pain upper right abdomen  . H/O hiatal hernia   . GERD (gastroesophageal reflux disease)   . Complication of anesthesia     sore throat after a surgery  . Asymptomatic bilateral carotid artery stenosis   . Wears dentures     top   Past Surgical History  Procedure Laterality Date  . Tubal ligation    . Tonsilectomy, adenoidectomy, bilateral myringotomy and tubes    . Cholecystectomy  07/08/2012    Procedure: LAPAROSCOPIC CHOLECYSTECTOMY WITH INTRAOPERATIVE CHOLANGIOGRAM;  Surgeon: Gayland Curry, MD,FACS;  Location: [facility];  Service: General;  Laterality: N/A;  Laparoscopic Cholecystectomy with Intraoperative Cholangiogram  . Colonoscopy     Current Outpatient Prescriptions on File Prior to Visit  Medication Sig Dispense Refill  . anastrozole (ARIMIDEX) 1 MG tablet Take 1 tablet (1 mg total) by mouth daily. 90 tablet 4  . aspirin 81 MG tablet Take 81 mg by mouth daily.    . Cholecalciferol (VITAMIN D3) 5000 UNITS CAPS Take 5,000 Units by mouth daily.    . cyanocobalamin 2000 MCG tablet Take 2,000 mcg by mouth daily. B12 slow release tablet daily    . diazepam (VALIUM) 5 MG tablet TAKE 1 TABLET BY MOUTH AT BEDTIME AS NEEDED FOR ANXIETY  3  . escitalopram (LEXAPRO) 20 MG tablet Take 20 mg by mouth daily.      Marland Kitchen esomeprazole (NEXIUM) 40 MG capsule Take 40 mg by mouth daily at 12 noon.    Marland Kitchen HYDROcodone-acetaminophen (NORCO) 5-325 MG tablet Take 1 tablet by mouth every 6 (six) hours as needed for moderate pain. 30 tablet 0  . losartan (COZAAR) 50 MG tablet TAKE 1 TABLET EVERY DAY 90 tablet 3  . Multiple Vitamin (MULTIVITAMIN) tablet Take 1 tablet by mouth daily.     No current facility-administered medications on file prior to visit.   Allergies  Allergen Reactions  . Latex   . Cefaclor Itching, Swelling and Rash  . Metronidazole Itching, Swelling and Rash  . Naproxen Itching, Swelling and Rash  . Nsaids Itching, Swelling and Rash    Can take advil  . Penicillins Itching, Swelling and Rash  . Septra [Bactrim] Itching, Swelling and Rash  . Sulfonamide Derivatives Itching, Swelling and Rash   Social History   Social History  . Marital Status: Married    Spouse Name: N/A  . Number of Children: 1  . Years of Education: N/A   Occupational History  . 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     Social History Main Topics  . Smoking status: Former Smoker -- 0.50 packs/day for 40 years    Types: Cigarettes  Quit date: 02/12/2013  . Smokeless tobacco: Never Used  . Alcohol Use: No  . Drug Use: No  . Sexual Activity: Not on file   Other Topics Concern  . Not on file   Social History Narrative      Review of Systems  All other systems reviewed and are negative.      Objective:   Physical Exam  Cardiovascular: Normal rate, regular rhythm and normal heart sounds.   Pulmonary/Chest: Effort normal and breath sounds normal.  Musculoskeletal: She exhibits edema and tenderness.       Right elbow: She exhibits swelling. She exhibits normal range of motion. Tenderness found. Lateral epicondyle tenderness noted.  Skin: Skin is warm. There is erythema.  Vitals reviewed.         Assessment & Plan:  Superficial thrombophlebitis - Plan: doxycycline (VIBRA-TABS) 100 MG tablet  I  believe she is developed superficial thrombophlebitis in the vein overlying the right lateral epicondyles. Begin doxycycline 100 mg by mouth twice a day, warm compresses 3 times a day, and increase aspirin to 325 mg by mouth daily and recheck next week or sooner if worse

## 2015-10-04 ENCOUNTER — Ambulatory Visit (HOSPITAL_COMMUNITY)
Admission: RE | Admit: 2015-10-04 | Discharge: 2015-10-04 | Disposition: A | Discharge status: Home / Self Care | Payer: Medicare Other | Source: Ambulatory Visit | Attending: Family Medicine | Admitting: Family Medicine

## 2015-10-04 ENCOUNTER — Ambulatory Visit (INDEPENDENT_AMBULATORY_CARE_PROVIDER_SITE_OTHER): Payer: Medicare Other | Admitting: Family Medicine

## 2015-10-04 ENCOUNTER — Encounter: Payer: Self-pay | Admitting: Family Medicine

## 2015-10-04 VITALS — BP 112/74 | HR 78 | Temp 97.9°F | Resp 18 | Ht 61.0 in | Wt 164.0 lb

## 2015-10-04 DIAGNOSIS — M25421 Effusion, right elbow: Secondary | ICD-10-CM | POA: Insufficient documentation

## 2015-10-04 DIAGNOSIS — M7989 Other specified soft tissue disorders: Secondary | ICD-10-CM | POA: Diagnosis not present

## 2015-10-04 MED ORDER — PREDNISONE 20 MG PO TABS: ORAL_TABLET | ORAL | Status: DC

## 2015-10-04 NOTE — Progress Notes (Signed)
Subjective:    Patient ID: Brittany Kirk, female    DOB: 27-Dec-1945, 70 y.o.   MRN: [id number]  HPI 09/30/15 The patient has a history of lymphedema in her right arm. Beginning earlier this week, the area over the lateral epicondyles became extremely swollen, slightly red, slightly warm, and slightly tender. The vein in that area is tender to palpation.  At that time, my plan was: I believe she has developed superficial thrombophlebitis in the vein overlying the right lateral epicondyle. Begin doxycycline 100 mg by mouth twice a day, warm compresses 3 times a day, and increase aspirin to 325 mg by mouth daily and recheck next week or sooner if worse.    10/04/15 The swelling is worsening in the right arm. She also appears to be developing urticaria around the lateral epicondyles. The skin remains slightly erythematous and violaceous in color. She now has pitting edema in the right arm. Symptoms are worsening on antibiotics and treatment for thrombophlebitis. Past Medical History  Diagnosis Date  . Hyperlipidemia   . Hypertension   . Depression   . Tobacco user   . History of skin cancer   . Allergic rhinitis   . Cancer ([place])   . CAD (coronary artery disease)   . Emphysema     no longer uses inhalers  . Shortness of breath     sob if walks up flight of stairs  . Gallstones     nausea, pain upper right abdomen  . H/O hiatal hernia   . GERD (gastroesophageal reflux disease)   . Complication of anesthesia     sore throat after a surgery  . Asymptomatic bilateral carotid artery stenosis   . Wears dentures     top   Past Surgical History  Procedure Laterality Date  . Tubal ligation    . Tonsilectomy, adenoidectomy, bilateral myringotomy and tubes    . Cholecystectomy  07/08/2012    Procedure: LAPAROSCOPIC CHOLECYSTECTOMY WITH INTRAOPERATIVE CHOLANGIOGRAM;  Surgeon: Gayland Curry, MD,FACS;  Location: [facility];  Service: General;  Laterality: N/A;  Laparoscopic Cholecystectomy with  Intraoperative Cholangiogram  . Colonoscopy     Current Outpatient Prescriptions on File Prior to Visit  Medication Sig Dispense Refill  . anastrozole (ARIMIDEX) 1 MG tablet Take 1 tablet (1 mg total) by mouth daily. 90 tablet 4  . aspirin 81 MG tablet Take 81 mg by mouth daily.    . Cholecalciferol (VITAMIN D3) 5000 UNITS CAPS Take 5,000 Units by mouth daily.    . cyanocobalamin 2000 MCG tablet Take 2,000 mcg by mouth daily. B12 slow release tablet daily    . diazepam (VALIUM) 5 MG tablet TAKE 1 TABLET BY MOUTH AT BEDTIME AS NEEDED FOR ANXIETY  3  . doxycycline (VIBRA-TABS) 100 MG tablet Take 1 tablet (100 mg total) by mouth 2 (two) times daily. 20 tablet 0  . escitalopram (LEXAPRO) 20 MG tablet Take 20 mg by mouth daily.    Marland Kitchen esomeprazole (NEXIUM) 40 MG capsule Take 40 mg by mouth daily at 12 noon.    Marland Kitchen HYDROcodone-acetaminophen (NORCO) 5-325 MG tablet Take 1 tablet by mouth every 6 (six) hours as needed for moderate pain. 30 tablet 0  . losartan (COZAAR) 50 MG tablet TAKE 1 TABLET EVERY DAY 90 tablet 3  . Multiple Vitamin (MULTIVITAMIN) tablet Take 1 tablet by mouth daily.     No current facility-administered medications on file prior to visit.   Allergies  Allergen Reactions  . Latex   .  Cefaclor Itching, Swelling and Rash  . Metronidazole Itching, Swelling and Rash  . Naproxen Itching, Swelling and Rash  . Nsaids Itching, Swelling and Rash    Can take advil  . Penicillins Itching, Swelling and Rash  . Septra [Bactrim] Itching, Swelling and Rash  . Sulfonamide Derivatives Itching, Swelling and Rash   Social History   Social History  . Marital Status: Married    Spouse Name: N/A  . Number of Children: 1  . Years of Education: N/A   Occupational History  . 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     Social History Main Topics  . Smoking status: Former Smoker -- 0.50 packs/day for 40 years    Types: Cigarettes    Quit date: 02/12/2013  . Smokeless tobacco: Never Used  .  Alcohol Use: No  . Drug Use: No  . Sexual Activity: Not on file   Other Topics Concern  . Not on file   Social History Narrative      Review of Systems  All other systems reviewed and are negative.      Objective:   Physical Exam  Cardiovascular: Normal rate, regular rhythm and normal heart sounds.   Pulmonary/Chest: Effort normal and breath sounds normal.  Musculoskeletal: She exhibits edema and tenderness.       Right elbow: She exhibits swelling. She exhibits normal range of motion. Tenderness found. Lateral epicondyle tenderness noted.  Skin: Skin is warm. There is erythema.  Vitals reviewed.         Assessment & Plan:  Swelling of joint of upper arm, right - Plan: Ultrasound doppler venous arms bilateral  Obtain venous Doppler ultrasound of the right arm to rule out a DVT in the right arm. If there is a DVT, I will start the patient on Xarelto. If there is no DVT, I will treat the patient for the urticaria in the swelling prednisone as the erythema and urticaria raises the suspicion of a local allergic reaction possibly although there is no obvious triggering agent

## 2015-10-04 NOTE — Addendum Note (Signed)
Addended by: Shary Decamp B on: 10/04/2015 04:01 PM   Modules accepted: Orders

## 2015-10-04 NOTE — Addendum Note (Signed)
Addended by: Olena Mater on: 10/04/2015 02:37 PM   Modules accepted: Orders

## 2015-10-05 ENCOUNTER — Ambulatory Visit: Payer: Medicare Other

## 2015-10-05 ENCOUNTER — Encounter: Payer: Medicare Other | Admitting: Physical Therapy

## 2015-10-06 ENCOUNTER — Encounter: Payer: Medicare Other | Admitting: Physical Therapy

## 2015-10-10 ENCOUNTER — Encounter: Payer: Medicare Other | Admitting: Physical Therapy

## 2015-10-13 ENCOUNTER — Ambulatory Visit: Payer: Medicare Other | Attending: General Surgery | Admitting: Physical Therapy

## 2015-10-13 DIAGNOSIS — I890 Lymphedema, not elsewhere classified: Secondary | ICD-10-CM | POA: Insufficient documentation

## 2015-10-13 DIAGNOSIS — R29898 Other symptoms and signs involving the musculoskeletal system: Secondary | ICD-10-CM | POA: Diagnosis not present

## 2015-10-14 ENCOUNTER — Ambulatory Visit: Payer: Medicare Other | Admitting: Physical Therapy

## 2015-10-14 ENCOUNTER — Encounter: Payer: Medicare Other | Admitting: Physical Therapy

## 2015-10-14 DIAGNOSIS — R29898 Other symptoms and signs involving the musculoskeletal system: Secondary | ICD-10-CM | POA: Diagnosis not present

## 2015-10-14 DIAGNOSIS — I890 Lymphedema, not elsewhere classified: Secondary | ICD-10-CM | POA: Diagnosis not present

## 2015-10-14 NOTE — Therapy (Signed)
[place], [place], [address] Phone: [phone]   Fax:  [phone]  Physical Therapy Treatment  Patient Details  Name: Brittany Kirk MRN: [id number] Date of Birth: 1945/07/01 Referring Provider: Dr. [name]  Encounter Date: 10/14/2015      PT End of Session - 10/14/15 1223    Visit Number 6   Number of Visits 13   Date for PT Re-Evaluation 10/28/15   PT Start Time 0845   PT Stop Time 0930   PT Time Calculation (min) 45 min   Activity Tolerance Patient tolerated treatment well   Behavior During Therapy [facility] for tasks assessed/performed      Past Medical History  Diagnosis Date  . Hyperlipidemia   . Hypertension   . Depression   . Tobacco user   . History of skin cancer   . Allergic rhinitis   . Cancer ([place])   . CAD (coronary artery disease)   . Emphysema     no longer uses inhalers  . Shortness of breath     sob if walks up flight of stairs  . Gallstones     nausea, pain upper right abdomen  . H/O hiatal hernia   . GERD (gastroesophageal reflux disease)   . Complication of anesthesia     sore throat after a surgery  . Asymptomatic bilateral carotid artery stenosis   . Wears dentures     top    Past Surgical History  Procedure Laterality Date  . Tubal ligation    . Tonsilectomy, adenoidectomy, bilateral myringotomy and tubes    . Cholecystectomy  07/08/2012    Procedure: LAPAROSCOPIC CHOLECYSTECTOMY WITH INTRAOPERATIVE CHOLANGIOGRAM;  Surgeon: Gayland Curry, MD,FACS;  Location: [facility];  Service: General;  Laterality: N/A;  Laparoscopic Cholecystectomy with Intraoperative Cholangiogram  . Colonoscopy      There were no vitals filed for this visit.      Subjective Assessment - 10/14/15 1155    Subjective "my hand swelled up" Pt comes in today afer removing bandages 2 hours ago so she could shower    Pertinent History In September 2015 (02/19/14), had right lumpectomy  and ALND (19 nodes).  Right arm swelling started pretty soon after surgery; she has been seen here before and has done manual lymph drainage but has not had a compression sleeve.  No other treatment except now on Arimidex.  HTN controlled with meds.     Patient Stated Goals Wants to get a sleeve for day- and nighttime.     Currently in Pain? No/denies               LYMPHEDEMA/ONCOLOGY QUESTIONNAIRE - 10/14/15 0854    Right Upper Extremity Lymphedema   15 cm Proximal to Olecranon Process 34 cm   10 cm Proximal to Olecranon Process 36.5 cm   Olecranon Process 32.3 cm   15 cm Proximal to Ulnar Styloid Process 32.5 cm   10 cm Proximal to Ulnar Styloid Process 28.3 cm   Just Proximal to Ulnar Styloid Process 18.7 cm   Across Hand at [employer] 18.2 cm   At [place] of 2nd Digit 6.2 cm                  [facility] Adult PT Treatment/Exercise - 10/14/15 1156    Self-Care   Other Self-Care Comments  issued handout and reviewed UE remedial exericise    Manual Therapy   Manual Lymphatic Drainage (MLD) In supine, short  neck, superficial and deep abdominals. right inquinal nodes and axillo inguinal anastamosis, left axillary nodes and anterior interaxillary anastamosis, left shoulder upper arm, elbow and forearm with return along pathways. then to sidelying for posterior interaxillary anasamosis, upper back and lateral trunk    Compression Bandaging biotone applied, then thick stockinette, 2 pieces of big dotted medi foam at posterior upper arm and proximal forearm  artiflex and 4 short stretch bandages from hand to axilla with herringbone pattern at forearm                 PT Education - 10/14/15 0714    Education provided Yes   Education Details if bandages become painful, first elevate and do wrist and forearm exercise, Remove bandages if pain is not relieved, bring all bandages back to nex session                 [place] - 10/14/15 0721    CC Long  Term Goal  #1   Title Patient will be independent in correctly performing self-manual lymph drainage for the right UE. and use of compression for self managment    Status On-going   CC Long Term Goal  #2   Title Patient will have been to [facility] (or have made an appointment there) to be fitted for garments.   Status Deferred   CC Long Term Goal  #3   Title Pt will have a reduction in right UE at 10 cm proximal to ulnar styloid of 1.5 cm    Status On-going   CC Long Term Goal  #4   Title report overall pain decreased >/= 50 % to tolerate daily tasks with less pain            Plan - 10/14/15 1223    Clinical Impression Statement Pt did not have good response to initial bandaging.  Showed her a sample of the juxafit garment and she thought it would be something she could work with to manage her lymphedema at home    Rehab Potential Good   Clinical Impairments Affecting Rehab Potential axillary node dissection    PT Frequency 3x / week   PT Duration 4 weeks   PT Next Visit Plan Continue  complete decongestive therapy  Measure for juxafit if pt not reducing.  Ask pt to keep bandages on til she comes into clinic on the day that juxtafit is measured    Consulted and Agree with Plan of Care Patient      Patient will benefit from skilled therapeutic intervention in order to improve the following deficits and impairments:  Increased edema, Decreased knowledge of precautions, Decreased knowledge of use of DME  Visit Diagnosis: Lymphedema, not elsewhere classified  Other symptoms and signs involving the musculoskeletal system     Problem List Patient Active Problem List   Diagnosis Date Noted  . Osteoporosis 02/22/2015  . CKD (chronic kidney disease), stage III 08/16/2014  . Breast cancer of upper-outer quadrant of right female breast ([place]) 03/22/2014  . Breast cancer-right s/p right partial mastectomy and right axillary LND 02/19/14 02/03/2014  . Dizziness and giddiness  11/27/2012  . Essential tremor 11/27/2012  . Numbness 11/27/2012  . TOBACCO USER 10/04/2009  . CLOSED FRACTURE OF ONE RIB 07/12/2009  . HYPERLIPIDEMIA TYPE IIB / III 09/28/2008  . CAD, NATIVE VESSEL 09/28/2008  . ALLERGIC RHINITIS 09/09/2008  . EMPHYSEMA 09/07/2008  . HYPERTENSION 09/06/2008  . SKIN CANCER, HX OF 09/06/2008   Helene Kelp  Bethena Midget, PT   10/14/2015, 12:39 PM  [place], [place], [address] Phone: [phone]   Fax:  [phone]  Name: Brittany Kirk MRN: [id number] Date of Birth: 12/11/1945

## 2015-10-14 NOTE — Therapy (Signed)
[place], [place], [address] Phone: [phone]   Fax:  [phone]  Physical Therapy Treatment  Patient Details  Name: Brittany Kirk MRN: [id number] Date of Birth: [birth year] Referring Provider: Dr. [name]  Encounter Date: 10/13/2015      PT End of Session - 10/14/15 0715    Visit Number 5   Number of Visits 13   Date for PT Re-Evaluation 10/28/15   PT Start Time [id number]   PT Stop Time 1600   PT Time Calculation (min) 45 min   Activity Tolerance Patient tolerated treatment well   Behavior During Therapy [facility] for tasks assessed/performed      Past Medical History  Diagnosis Date  . Hyperlipidemia   . Hypertension   . Depression   . Tobacco user   . History of skin cancer   . Allergic rhinitis   . Cancer ([place])   . CAD (coronary artery disease)   . Emphysema     no longer uses inhalers  . Shortness of breath     sob if walks up flight of stairs  . Gallstones     nausea, pain upper right abdomen  . H/O hiatal hernia   . GERD (gastroesophageal reflux disease)   . Complication of anesthesia     sore throat after a surgery  . Asymptomatic bilateral carotid artery stenosis   . Wears dentures     top    Past Surgical History  Procedure Laterality Date  . Tubal ligation    . Tonsilectomy, adenoidectomy, bilateral myringotomy and tubes    . Cholecystectomy  07/08/2012    Procedure: LAPAROSCOPIC CHOLECYSTECTOMY WITH INTRAOPERATIVE CHOLANGIOGRAM;  Surgeon: Gayland Curry, MD,FACS;  Location: [facility];  Service: General;  Laterality: N/A;  Laparoscopic Cholecystectomy with Intraoperative Cholangiogram  . Colonoscopy      There were no vitals filed for this visit.      Subjective Assessment - 10/13/15 1521    Subjective " I feel better"  Pt has completed course of antibiotic and prednisone.  Ultrasound showed no blood clot    Currently in Pain? No/denies                LYMPHEDEMA/ONCOLOGY QUESTIONNAIRE - 10/13/15 1522    Right Upper Extremity Lymphedema   15 cm Proximal to Olecranon Process 34 cm   10 cm Proximal to Olecranon Process 36.5 cm   Olecranon Process 32.5 cm  below skin fold    15 cm Proximal to Ulnar Styloid Process 32.5 cm   10 cm Proximal to Ulnar Styloid Process 28.5 cm   Just Proximal to Ulnar Styloid Process 19 cm   Across Hand at [employer] 17.9 cm   At [place] of 2nd Digit 5.7 cm                  [facility] Adult PT Treatment/Exercise - 10/14/15 0001    Self-Care   Other Self-Care Comments  brief instruction in remedial exercise    Manual Therapy   Manual Therapy Manual Lymphatic Drainage (MLD);Compression Bandaging   Edema Management circumference measurements taken    Manual Lymphatic Drainage (MLD) In supine, short neck, superficial and deep abdominals. right inquinal nodes and axillo inguinal anastamosis, left axillary nodes and anterior interaxillary anastamosis, left shoulder upper arm, elbow and forearm with return along pathways.    Compression Bandaging biotone applied, then thick stockinette, artiflex and 4 short stretch bandages from hand to axilla with herringbone pattern  at forearm                 PT Education - 10/14/15 0714    Education provided Yes   Education Details if bandages become painful, first elevate and do wrist and forearm exercise, Remove bandages if pain is not relieved, bring all bandages back to nex session                 [place] - 10/14/15 0721    CC Long Term Goal  #1   Title Patient will be independent in correctly performing self-manual lymph drainage for the right UE. and use of compression for self managment    Status On-going   CC Long Term Goal  #2   Title Patient will have been to [facility] (or have made an appointment there) to be fitted for garments.   Status Deferred   CC Long Term Goal  #3   Title Pt will have a reduction in right UE  at 10 cm proximal to ulnar styloid of 1.5 cm    Status On-going   CC Long Term Goal  #4   Title report overall pain decreased >/= 50 % to tolerate daily tasks with less pain            Plan - 10/14/15 0716    Clinical Impression Statement Pt is ready for compression bandaging along with manual lymph drainage to get reduction of lymphedema in right UE.  She toleraed intial session well    Rehab Potential Good   Clinical Impairments Affecting Rehab Potential axillary node dissection    PT Frequency 3x / week   PT Duration 4 weeks   PT Treatment/Interventions Manual techniques;Patient/family education;ADLs/Self Care Home Management   PT Next Visit Plan Continue  complete decongestive therapy     Recommended Other Services Consider juxtafit or other bandage alernative for nightime    Consulted and Agree with Plan of Care Patient      Patient will benefit from skilled therapeutic intervention in order to improve the following deficits and impairments:  Increased edema, Decreased knowledge of precautions, Decreased knowledge of use of DME  Visit Diagnosis: Lymphedema, not elsewhere classified  Other symptoms and signs involving the musculoskeletal system     Problem List Patient Active Problem List   Diagnosis Date Noted  . Osteoporosis 02/22/2015  . CKD (chronic kidney disease), stage III 08/16/2014  . Breast cancer of upper-outer quadrant of right female breast ([place]) 03/22/2014  . Breast cancer-right s/p right partial mastectomy and right axillary LND 02/19/14 02/03/2014  . Dizziness and giddiness 11/27/2012  . Essential tremor 11/27/2012  . Numbness 11/27/2012  . TOBACCO USER 10/04/2009  . CLOSED FRACTURE OF ONE RIB 07/12/2009  . HYPERLIPIDEMIA TYPE IIB / III 09/28/2008  . CAD, NATIVE VESSEL 09/28/2008  . ALLERGIC RHINITIS 09/09/2008  . EMPHYSEMA 09/07/2008  . HYPERTENSION 09/06/2008  . SKIN CANCER, HX OF 09/06/2008   Brittany Kirk, PT    10/14/2015, [place], [place], [address] Phone: [phone]   Fax:  [phone]  Name: Brittany Kirk MRN: [id number] Date of Birth: Jul 06, 1945

## 2015-10-14 NOTE — Patient Instructions (Signed)
Neck Side-Bending   Begin with chin level, head centered over spine. Slowly lower one ear toward shoulder keeping shoulder down. Hold __3__ seconds. Slowly return to starting position. Repeat to other side.  Repeat __5-10__ times to each side. Do 2-3 times a day. Also stretch by looking over right shoulder, then left.  Scapular Retraction (Standing)   With arms at sides, pinch shoulder blades together.  Hold 3 seconds. Repeat __5-10__ times per set.  Do __2-3__ sessions per day.   Active ROM Flexion    Raise arm to point to ceiling, keeping elbow straight. Hold __3__ seconds. Repeat _5-10___ times. Do _2-3___ sessions per day. Activity: Use this motion to reach for items on overhead shelves.     Flexion (Active)   Palm up, rest elbow on other hand. Bend as far as possible. Hold __3__ seconds. Repeat __5-10__ times. Do _2-3___ sessions per day. Can use other hand to bend elbow further if needed where bandage may limit end motion.  Copyright  VHI. All rights reserved.    Extension (Active With Finger Extension)   Llift hand with fingers straight, then bend wrist down.  Hold _3___ seconds each way. Repeat __5-10__ times. Do _2-3___ sessions per day.    AROM: Forearm Pronation / Supination   With right arm in handshake position, slowly rotate palm down. Relax. Then rotate palm up. Repeat __5-10__ times per set. Do __2-3__ sessions per day.  Copyright  VHI. All rights reserved.    AROM: Finger Flexion / Extension   Actively bend fingers of right hand. Start with knuckles furthest from palm, and slowly make a fist. Hold __3__ seconds. Relax. Then straighten fingers as far as possible. Repeat _5-10___ times per set.  Do __2-3__ sessions per day. Also spread fingers as far as able, then bring them back together.  Copyright  VHI. All rights reserved.   If any pain/tingling symptoms occur, try all of these exercises first to promote circulation. If symptoms do not  ease off, then remove bandages and bring all wrappings back to next appointment.    

## 2015-10-17 ENCOUNTER — Ambulatory Visit: Payer: Medicare Other | Admitting: Physical Therapy

## 2015-10-17 DIAGNOSIS — I890 Lymphedema, not elsewhere classified: Secondary | ICD-10-CM

## 2015-10-17 DIAGNOSIS — R29898 Other symptoms and signs involving the musculoskeletal system: Secondary | ICD-10-CM | POA: Diagnosis not present

## 2015-10-17 NOTE — Therapy (Signed)
[place], [place], [address] Phone: [phone]   Fax:  [phone]  Physical Therapy Treatment  Patient Details  Name: Brittany Kirk MRN: [id number] Date of Birth: 1946/01/02 Referring Provider: Dr. [name]  Encounter Date: 10/17/2015      PT End of Session - 10/17/15 1722    Visit Number 7   Date for PT Re-Evaluation 10/28/15   PT Start Time 1301   PT Stop Time 1348   PT Time Calculation (min) 47 min   Activity Tolerance Patient tolerated treatment well   Behavior During Therapy [facility] for tasks assessed/performed      Past Medical History  Diagnosis Date  . Hyperlipidemia   . Hypertension   . Depression   . Tobacco user   . History of skin cancer   . Allergic rhinitis   . Cancer ([place])   . CAD (coronary artery disease)   . Emphysema     no longer uses inhalers  . Shortness of breath     sob if walks up flight of stairs  . Gallstones     nausea, pain upper right abdomen  . H/O hiatal hernia   . GERD (gastroesophageal reflux disease)   . Complication of anesthesia     sore throat after a surgery  . Asymptomatic bilateral carotid artery stenosis   . Wears dentures     top    Past Surgical History  Procedure Laterality Date  . Tubal ligation    . Tonsilectomy, adenoidectomy, bilateral myringotomy and tubes    . Cholecystectomy  07/08/2012    Procedure: LAPAROSCOPIC CHOLECYSTECTOMY WITH INTRAOPERATIVE CHOLANGIOGRAM;  Surgeon: Gayland Curry, MD,FACS;  Location: [facility];  Service: General;  Laterality: N/A;  Laparoscopic Cholecystectomy with Intraoperative Cholangiogram  . Colonoscopy      There were no vitals filed for this visit.      Subjective Assessment - 10/17/15 1304    Subjective I left my bandages on until Sunday morning around 8. Last night I slept in one of the sleeves (TG soft). I think the inner part of my forearm feels softer but it does look more swollen than  it did earlier today.    Pertinent History In September 2015 (02/19/14), had right lumpectomy and ALND (19 nodes).  Right arm swelling started pretty soon after surgery; she has been seen here before and has done manual lymph drainage but has not had a compression sleeve.  No other treatment except now on Arimidex.  HTN controlled with meds.     Patient Stated Goals Wants to get a sleeve for day- and nighttime.     Currently in Pain? No/denies               LYMPHEDEMA/ONCOLOGY QUESTIONNAIRE - 10/17/15 1306    Right Upper Extremity Lymphedema   15 cm Proximal to Olecranon Process 34.5 cm   10 cm Proximal to Olecranon Process 36.5 cm   Olecranon Process 32.1 cm   15 cm Proximal to Ulnar Styloid Process 32 cm   10 cm Proximal to Ulnar Styloid Process 28 cm   Just Proximal to Ulnar Styloid Process 18.1 cm   Across Hand at [employer] 17.4 cm   At [place] of 2nd Digit 6 cm                  [facility] Adult PT Treatment/Exercise - 10/17/15 0001    Manual Therapy   Manual Lymphatic Drainage (MLD) In supine,  short neck, superficial and deep abdominals. right inquinal nodes and axillo inguinal anastamosis, left axillary nodes and anterior interaxillary anastamosis, left shoulder upper arm, elbow and forearm with return along pathways. then to sidelying for posterior interaxillary anasamosis, upper back and lateral trunk    Compression Bandaging biotone applied, then thick stockinette, transelast to fingers,  2 pieces of big dotted medi foam at posterior upper arm and proximal forearm  artiflex and 4 short stretch bandages from hand to axilla with herringbone pattern at forearm                         [place] - 10/14/15 [id number]    CC Long Term Goal  #1   Title Patient will be independent in correctly performing self-manual lymph drainage for the right UE. and use of compression for self managment    Status On-going   CC Long Term Goal  #2   Title Patient  will have been to [facility] (or have made an appointment there) to be fitted for garments.   Status Deferred   CC Long Term Goal  #3   Title Pt will have a reduction in right UE at 10 cm proximal to ulnar styloid of 1.5 cm    Status On-going   CC Long Term Goal  #4   Title report overall pain decreased >/= 50 % to tolerate daily tasks with less pain            Plan - 10/17/15 1723    Clinical Impression Statement Pt kept bandaging intact until Sunday morning and noticed a decreased in edema especially at her forearm. Pt also stated the area felt "softer". Despite not having bandages intact since Sunday pt forearm circumferential measurements were still decreased from last session. She will be measred for CircAiid this week.    Rehab Potential Good   Clinical Impairments Affecting Rehab Potential axillary node dissection    PT Frequency 3x / week   PT Duration 4 weeks   PT Treatment/Interventions Manual techniques;Patient/family education;ADLs/Self Care Home Management   PT Next Visit Plan Continue  complete decongestive therapy  Measure for juxafit when pt arrives with bandages intact should be Wednesday or Friday   Consulted and Agree with Plan of Care Patient      Patient will benefit from skilled therapeutic intervention in order to improve the following deficits and impairments:  Increased edema, Decreased knowledge of precautions, Decreased knowledge of use of DME  Visit Diagnosis: Lymphedema, not elsewhere classified     Problem List Patient Active Problem List   Diagnosis Date Noted  . Osteoporosis 02/22/2015  . CKD (chronic kidney disease), stage III 08/16/2014  . Breast cancer of upper-outer quadrant of right female breast ([place]) 03/22/2014  . Breast cancer-right s/p right partial mastectomy and right axillary LND 02/19/14 02/03/2014  . Dizziness and giddiness 11/27/2012  . Essential tremor 11/27/2012  . Numbness 11/27/2012  . TOBACCO USER 10/04/2009  .  CLOSED FRACTURE OF ONE RIB 07/12/2009  . HYPERLIPIDEMIA TYPE IIB / III 09/28/2008  . CAD, NATIVE VESSEL 09/28/2008  . ALLERGIC RHINITIS 09/09/2008  . EMPHYSEMA 09/07/2008  . HYPERTENSION 09/06/2008  . SKIN CANCER, HX OF 09/06/2008    Alexia Freestone 10/17/2015, 5:30 PM  [place], [place], [address] Phone: [phone]   Fax:  [phone]  Name: JALINA FELICIANO MRN: [id number] Date of Birth: 01/17/1946    Hilaria Ota  Breedlove, PT 10/17/2015 5:30 PM

## 2015-10-18 ENCOUNTER — Encounter: Payer: Medicare Other | Admitting: Physical Therapy

## 2015-10-19 ENCOUNTER — Encounter: Payer: Self-pay | Admitting: Physical Therapy

## 2015-10-19 ENCOUNTER — Ambulatory Visit: Payer: Medicare Other

## 2015-10-19 ENCOUNTER — Ambulatory Visit: Payer: Medicare Other | Admitting: Physical Therapy

## 2015-10-19 DIAGNOSIS — I890 Lymphedema, not elsewhere classified: Secondary | ICD-10-CM | POA: Diagnosis not present

## 2015-10-19 DIAGNOSIS — R29898 Other symptoms and signs involving the musculoskeletal system: Secondary | ICD-10-CM | POA: Diagnosis not present

## 2015-10-19 NOTE — Therapy (Deleted)
[place], [place], [address] Phone: [phone]   Fax:  [phone]  Physical Therapy Treatment  Patient Details  Name: Brittany Kirk MRN: [id number] Date of Birth: [birth year] Referring Provider: Dr. [name]  Encounter Date: 10/19/2015      PT End of Session - 10/19/15 1746    Visit Number 8   Number of Visits 13   Date for PT Re-Evaluation 10/28/15   PT Start Time [id number]   PT Stop Time 1519   PT Time Calculation (min) 45 min   Activity Tolerance Patient tolerated treatment well   Behavior During Therapy [facility] for tasks assessed/performed      Past Medical History  Diagnosis Date  . Hyperlipidemia   . Hypertension   . Depression   . Tobacco user   . History of skin cancer   . Allergic rhinitis   . Cancer ([place])   . CAD (coronary artery disease)   . Emphysema     no longer uses inhalers  . Shortness of breath     sob if walks up flight of stairs  . Gallstones     nausea, pain upper right abdomen  . H/O hiatal hernia   . GERD (gastroesophageal reflux disease)   . Complication of anesthesia     sore throat after a surgery  . Asymptomatic bilateral carotid artery stenosis   . Wears dentures     top    Past Surgical History  Procedure Laterality Date  . Tubal ligation    . Tonsilectomy, adenoidectomy, bilateral myringotomy and tubes    . Cholecystectomy  07/08/2012    Procedure: LAPAROSCOPIC CHOLECYSTECTOMY WITH INTRAOPERATIVE CHOLANGIOGRAM;  Surgeon: Gayland Curry, MD,FACS;  Location[facility];  Service: General;  Laterality: N/A;  Laparoscopic Cholecystectomy with Intraoperative Cholangiogram  . Colonoscopy      There were no vitals filed for this visit.      Subjective Assessment - 10/19/15 1745    Subjective I have left my bandages on. I can't be wrapped on Friday because I am going to a graduation and I can not get ready if my arm is wrapped.    Pertinent History In  September 2015 (02/19/14), had right lumpectomy and ALND (19 nodes).  Right arm swelling started pretty soon after surgery; she has been seen here before and has done manual lymph drainage but has not had a compression sleeve.  No other treatment except now on Arimidex.  HTN controlled with meds.     Patient Stated Goals Wants to get a sleeve for day- and nighttime.     Currently in Pain? No/denies                         [facility] Adult PT Treatment/Exercise - 10/19/15 0001    Manual Therapy   Compression Bandaging thick stockinette, transelast to fingers,  2 pieces of big dotted medi foam at posterior upper arm and proximal forearm  artiflex and 4 short stretch bandages from hand to axilla with herringbone pattern at forearm                 PT Education - 10/19/15 1746    Education provided Yes   Education Details different velcro bandaging garments for management of edema   Methods Explanation   Comprehension Verbalized understanding                [facility] Goals - 10/14/15 [id number]  CC Long Term Goal  #1   Title Patient will be independent in correctly performing self-manual lymph drainage for the right UE. and use of compression for self managment    Status On-going   CC Long Term Goal  #2   Title Patient will have been to [facility] (or have made an appointment there) to be fitted for garments.   Status Deferred   CC Long Term Goal  #3   Title Pt will have a reduction in right UE at 10 cm proximal to ulnar styloid of 1.5 cm    Status On-going   CC Long Term Goal  #4   Title report overall pain decreased >/= 50 % to tolerate daily tasks with less pain            Plan - 10/19/15 1747    Clinical Impression Statement Discussed different velcro garments for management of upper extremity lymphedema. Pt and therapist agreed on CircAid Ju[employer] Essentials custom sleeve and hand piece (velcro). Pt was measured for custom sleeve and hand piece  this session for pt to use as long term management of edema.    Rehab Potential Good   Clinical Impairments Affecting Rehab Potential axillary node dissection    PT Frequency 3x / week   PT Duration 4 weeks   PT Treatment/Interventions Manual techniques;Patient/family education;ADLs/Self Care Home Management   PT Next Visit Plan continue MLD, pt measured last session for garment   Consulted and Agree with Plan of Care Patient      Patient will benefit from skilled therapeutic intervention in order to improve the following deficits and impairments:  Increased edema, Decreased knowledge of precautions, Decreased knowledge of use of DME  Visit Diagnosis: Lymphedema, not elsewhere classified     Problem List Patient Active Problem List   Diagnosis Date Noted  . Osteoporosis 02/22/2015  . CKD (chronic kidney disease), stage III 08/16/2014  . Breast cancer of upper-outer quadrant of right female breast ([place]) 03/22/2014  . Breast cancer-right s/p right partial mastectomy and right axillary LND 02/19/14 02/03/2014  . Dizziness and giddiness 11/27/2012  . Essential tremor 11/27/2012  . Numbness 11/27/2012  . TOBACCO USER 10/04/2009  . CLOSED FRACTURE OF ONE RIB 07/12/2009  . HYPERLIPIDEMIA TYPE IIB / III 09/28/2008  . CAD, NATIVE VESSEL 09/28/2008  . ALLERGIC RHINITIS 09/09/2008  . EMPHYSEMA 09/07/2008  . HYPERTENSION 09/06/2008  . SKIN CANCER, HX OF 09/06/2008    Alexia Freestone 10/19/2015, 5:51 PM  [place], [place], [address] Phone: [phone]   Fax:  [phone]  Name: Brittany Kirk MRN: [id number] Date of Birth: 1945/07/27    Allyson Sabal, PT 10/19/2015 5:51 PM

## 2015-10-19 NOTE — Therapy (Signed)
[place], [place], [address] Phone: [phone]   Fax:  [phone]  Physical Therapy Treatment  Patient Details  Name: Brittany Kirk MRN: [id number] Date of Birth: 10/13/1945 Referring Provider: Dr. [name]  Encounter Date: 10/19/2015      PT End of Session - 10/19/15 1746    Visit Number 8   Number of Visits 13   Date for PT Re-Evaluation 10/28/15   PT Start Time [id number]   PT Stop Time 1519   PT Time Calculation (min) 45 min   Activity Tolerance Patient tolerated treatment well   Behavior During Therapy [facility] for tasks assessed/performed      Past Medical History  Diagnosis Date  . Hyperlipidemia   . Hypertension   . Depression   . Tobacco user   . History of skin cancer   . Allergic rhinitis   . Cancer ([place])   . CAD (coronary artery disease)   . Emphysema     no longer uses inhalers  . Shortness of breath     sob if walks up flight of stairs  . Gallstones     nausea, pain upper right abdomen  . H/O hiatal hernia   . GERD (gastroesophageal reflux disease)   . Complication of anesthesia     sore throat after a surgery  . Asymptomatic bilateral carotid artery stenosis   . Wears dentures     top    Past Surgical History  Procedure Laterality Date  . Tubal ligation    . Tonsilectomy, adenoidectomy, bilateral myringotomy and tubes    . Cholecystectomy  07/08/2012    Procedure: LAPAROSCOPIC CHOLECYSTECTOMY WITH INTRAOPERATIVE CHOLANGIOGRAM;  Surgeon: Gayland Curry, MD,FACS;  Location: [facility];  Service: General;  Laterality: N/A;  Laparoscopic Cholecystectomy with Intraoperative Cholangiogram  . Colonoscopy      There were no vitals filed for this visit.      Subjective Assessment - 10/19/15 1745    Subjective I have left my bandages on. I can't be wrapped on Friday because I am going to a graduation and I can not get ready if my arm is wrapped.    Pertinent History In  September 2015 (02/19/14), had right lumpectomy and ALND (19 nodes).  Right arm swelling started pretty soon after surgery; she has been seen here before and has done manual lymph drainage but has not had a compression sleeve.  No other treatment except now on Arimidex.  HTN controlled with meds.     Patient Stated Goals Wants to get a sleeve for day- and nighttime.     Currently in Pain? No/denies                         [facility] Adult PT Treatment/Exercise - 10/19/15 0001    Manual Therapy   Edema Management measured for custom compression garments   Compression Bandaging thick stockinette, transelast to fingers,  2 pieces of big dotted medi foam at posterior upper arm and proximal forearm  artiflex and 4 short stretch bandages from hand to axilla with herringbone pattern at forearm                 PT Education - 10/19/15 1746    Education provided Yes   Education Details different velcro bandaging garments for management of edema   Methods Explanation   Comprehension Verbalized understanding  [facility] Goals - 10/14/15 [id number]    CC Long Term Goal  #1   Title Patient will be independent in correctly performing self-manual lymph drainage for the right UE. and use of compression for self managment    Status On-going   CC Long Term Goal  #2   Title Patient will have been to [facility] (or have made an appointment there) to be fitted for garments.   Status Deferred   CC Long Term Goal  #3   Title Pt will have a reduction in right UE at 10 cm proximal to ulnar styloid of 1.5 cm    Status On-going   CC Long Term Goal  #4   Title report overall pain decreased >/= 50 % to tolerate daily tasks with less pain            Plan - 10/19/15 1747    Clinical Impression Statement Discussed different velcro garments for management of upper extremity lymphedema. Pt and therapist agreed on CircAid Juxta Essentials custom sleeve and hand piece  (velcro). Pt was measured for custom sleeve and hand piece this session for pt to use as long term management of edema.    Rehab Potential Good   Clinical Impairments Affecting Rehab Potential axillary node dissection    PT Frequency 3x / week   PT Duration 4 weeks   PT Treatment/Interventions Manual techniques;Patient/family education;ADLs/Self Care Home Management   PT Next Visit Plan continue MLD, pt measured last session for garment   Consulted and Agree with Plan of Care Patient      Patient will benefit from skilled therapeutic intervention in order to improve the following deficits and impairments:  Increased edema, Decreased knowledge of precautions, Decreased knowledge of use of DME  Visit Diagnosis: Lymphedema, not elsewhere classified     Problem List Patient Active Problem List   Diagnosis Date Noted  . Osteoporosis 02/22/2015  . CKD (chronic kidney disease), stage III 08/16/2014  . Breast cancer of upper-outer quadrant of right female breast ([place]) 03/22/2014  . Breast cancer-right s/p right partial mastectomy and right axillary LND 02/19/14 02/03/2014  . Dizziness and giddiness 11/27/2012  . Essential tremor 11/27/2012  . Numbness 11/27/2012  . TOBACCO USER 10/04/2009  . CLOSED FRACTURE OF ONE RIB 07/12/2009  . HYPERLIPIDEMIA TYPE IIB / III 09/28/2008  . CAD, NATIVE VESSEL 09/28/2008  . ALLERGIC RHINITIS 09/09/2008  . EMPHYSEMA 09/07/2008  . HYPERTENSION 09/06/2008  . SKIN CANCER, HX OF 09/06/2008    Brittany Kirk 10/19/2015, 5:53 PM  [place], [place], [address] Phone: [phone]   Fax:  [phone]  Name: Brittany Kirk MRN: [id number] Date of Birth: 20-Feb-1946    Allyson Sabal, PT 10/19/2015 5:53 PM

## 2015-10-21 ENCOUNTER — Ambulatory Visit: Payer: Medicare Other | Admitting: Physical Therapy

## 2015-10-21 ENCOUNTER — Encounter: Payer: Self-pay | Admitting: Physical Therapy

## 2015-10-21 DIAGNOSIS — I890 Lymphedema, not elsewhere classified: Secondary | ICD-10-CM | POA: Diagnosis not present

## 2015-10-21 DIAGNOSIS — R29898 Other symptoms and signs involving the musculoskeletal system: Secondary | ICD-10-CM | POA: Diagnosis not present

## 2015-10-21 NOTE — Therapy (Signed)
[place], [place], [address] Phone: [phone]   Fax:  [phone]  Physical Therapy Treatment  Patient Details  Name: Brittany Kirk MRN: [id number] Date of Birth: Dec 11, 1945 Referring Provider: Dr. [name]  Encounter Date: 10/21/2015      PT End of Session - 10/21/15 1154    Visit Number 9   Number of Visits 13   Date for PT Re-Evaluation 10/28/15   PT Start Time 1105   PT Stop Time 1149   PT Time Calculation (min) 44 min   Activity Tolerance Patient tolerated treatment well   Behavior During Therapy [name] for tasks assessed/performed      Past Medical History  Diagnosis Date  . Hyperlipidemia   . Hypertension   . Depression   . Tobacco user   . History of skin cancer   . Allergic rhinitis   . Cancer ([place])   . CAD (coronary artery disease)   . Emphysema     no longer uses inhalers  . Shortness of breath     sob if walks up flight of stairs  . Gallstones     nausea, pain upper right abdomen  . H/O hiatal hernia   . GERD (gastroesophageal reflux disease)   . Complication of anesthesia     sore throat after a surgery  . Asymptomatic bilateral carotid artery stenosis   . Wears dentures     top    Past Surgical History  Procedure Laterality Date  . Tubal ligation    . Tonsilectomy, adenoidectomy, bilateral myringotomy and tubes    . Cholecystectomy  07/08/2012    Procedure: LAPAROSCOPIC CHOLECYSTECTOMY WITH INTRAOPERATIVE CHOLANGIOGRAM;  Surgeon: Gayland Curry, MD,FACS;  Location: [facility];  Service: General;  Laterality: N/A;  Laparoscopic Cholecystectomy with Intraoperative Cholangiogram  . Colonoscopy      There were no vitals filed for this visit.      Subjective Assessment - 10/21/15 1106    Subjective I can not have my arm wrapped today because I am going to graduation and I need to be able to use my arm.    Pertinent History In September 2015 (02/19/14), had right  lumpectomy and ALND (19 nodes).  Right arm swelling started pretty soon after surgery; she has been seen here before and has done manual lymph drainage but has not had a compression sleeve.  No other treatment except now on Arimidex.  HTN controlled with meds.     Patient Stated Goals Wants to get a sleeve for day- and nighttime.     Currently in Pain? No/denies   Pain Score 0-No pain               LYMPHEDEMA/ONCOLOGY QUESTIONNAIRE - 10/21/15 1144    Right Upper Extremity Lymphedema   10 cm Proximal to Ulnar Styloid Process 24 cm                  [facility] Adult PT Treatment/Exercise - 10/21/15 0001    Manual Therapy   Manual Lymphatic Drainage (MLD) In supine, short neck, superficial and deep abdominals. right inquinal nodes and axillo inguinal anastamosis, left axillary nodes and anterior interaxillary anastamosis, left shoulder upper arm, elbow and forearm with return along pathways. then to sidelying for posterior interaxillary anasamosis, upper back and lateral trunk                         [facility] Goals -  10/21/15 1146    CC Long Term Goal  #1   Title Patient will be independent in correctly performing self-manual lymph drainage for the right UE. and use of compression for self managment    Baseline 10/21/15- Pt has been limited because her arm has been bandaged in between session, pt awaiting arrival of compression garments   Time 4   Period Weeks   Status On-going   CC Long Term Goal  #2   Title Patient will have been to [facility] (or have made an appointment there) to be fitted for garments.   Baseline 10/21/15- Pt has an appointment next Monday to be fitted for a sports bra   Time 2   Period Weeks   Status On-going   CC Long Term Goal  #3   Title Pt will have a reduction in right UE at 10 cm proximal to ulnar styloid of 1.5 cm    Baseline 29 on 09/28/2015, 24 cm on 10/21/15   Status Achieved   CC Long Term Goal  #4   Title report  overall pain decreased >/= 50 % to tolerate daily tasks with less pain   Status Achieved   CC Long Term Goal  #5   Title increase right shoulder flexion range of motion to >/= 130 degrees fo rincreased functional use of arm   Status Achieved   CC Long Term Goal  #6   Title increase right shoulder abduction range of motion to >/= 130 degrees for increased functional use of arm   Status Achieved            Plan - 10/21/15 1154    Clinical Impression Statement Pt is progressing towards goals in therapy. She has met her goal for reduction of RUE edema. She demonstrates significant softening of fibrosis and reduction of edema at forearm. She declined compression bandaging today because she is going to her grandson's graduation and did not want to be bandaged. Custom compression garments have been ordered and pt is awaiting arrival.    Rehab Potential Good   Clinical Impairments Affecting Rehab Potential axillary node dissection    PT Frequency 3x / week   PT Duration 4 weeks   PT Treatment/Interventions Manual techniques;Patient/family education;ADLs/Self Care Home Management   PT Next Visit Plan continue MLD and bandaging   Consulted and Agree with Plan of Care Patient      Patient will benefit from skilled therapeutic intervention in order to improve the following deficits and impairments:  Increased edema, Decreased knowledge of precautions, Decreased knowledge of use of DME  Visit Diagnosis: Lymphedema, not elsewhere classified     Problem List Patient Active Problem List   Diagnosis Date Noted  . Osteoporosis 02/22/2015  . CKD (chronic kidney disease), stage III 08/16/2014  . Breast cancer of upper-outer quadrant of right female breast ([place]) 03/22/2014  . Breast cancer-right s/p right partial mastectomy and right axillary LND 02/19/14 02/03/2014  . Dizziness and giddiness 11/27/2012  . Essential tremor 11/27/2012  . Numbness 11/27/2012  . TOBACCO USER 10/04/2009  . CLOSED  FRACTURE OF ONE RIB 07/12/2009  . HYPERLIPIDEMIA TYPE IIB / III 09/28/2008  . CAD, NATIVE VESSEL 09/28/2008  . ALLERGIC RHINITIS 09/09/2008  . EMPHYSEMA 09/07/2008  . HYPERTENSION 09/06/2008  . SKIN CANCER, HX OF 09/06/2008    Alexia Freestone 10/21/2015, 11:56 AM  [place], [place], [address] Phone: [phone]   Fax:  [phone]  Name:  Brittany Kirk MRN: [id number] Date of Birth: Nov 01, 1945    Allyson Sabal, PT 10/21/2015 11:56 AM

## 2015-10-24 ENCOUNTER — Ambulatory Visit: Payer: Medicare Other | Admitting: Physical Therapy

## 2015-10-24 ENCOUNTER — Encounter: Payer: Self-pay | Admitting: Physical Therapy

## 2015-10-24 DIAGNOSIS — I890 Lymphedema, not elsewhere classified: Secondary | ICD-10-CM

## 2015-10-24 DIAGNOSIS — R29898 Other symptoms and signs involving the musculoskeletal system: Secondary | ICD-10-CM | POA: Diagnosis not present

## 2015-10-24 NOTE — Therapy (Signed)
[place], [place], [address] Phone: [phone]   Fax:  [phone]  Physical Therapy Treatment  Patient Details  Name: Brittany Kirk MRN: [id number] Date of Birth: [birth year] Referring Provider: Dr. [name]  Encounter Date: 10/24/2015      PT End of Session - 10/24/15 1519    Visit Number 10   Number of Visits 13   Date for PT Re-Evaluation 10/28/15   PT Start Time [id number]   PT Stop Time 1519   PT Time Calculation (min) 48 min   Activity Tolerance Patient tolerated treatment well   Behavior During Therapy [facility] for tasks assessed/performed      Past Medical History  Diagnosis Date  . Hyperlipidemia   . Hypertension   . Depression   . Tobacco user   . History of skin cancer   . Allergic rhinitis   . Cancer ([place])   . CAD (coronary artery disease)   . Emphysema     no longer uses inhalers  . Shortness of breath     sob if walks up flight of stairs  . Gallstones     nausea, pain upper right abdomen  . H/O hiatal hernia   . GERD (gastroesophageal reflux disease)   . Complication of anesthesia     sore throat after a surgery  . Asymptomatic bilateral carotid artery stenosis   . Wears dentures     top    Past Surgical History  Procedure Laterality Date  . Tubal ligation    . Tonsilectomy, adenoidectomy, bilateral myringotomy and tubes    . Cholecystectomy  07/08/2012    Procedure: LAPAROSCOPIC CHOLECYSTECTOMY WITH INTRAOPERATIVE CHOLANGIOGRAM;  Surgeon: Gayland Curry, MD,FACS;  Location: [facility];  Service: General;  Laterality: N/A;  Laparoscopic Cholecystectomy with Intraoperative Cholangiogram  . Colonoscopy      There were no vitals filed for this visit.      Subjective Assessment - 10/24/15 1434    Subjective I think my arm is doing pretty good. I had a busy weekend. I did wear my sleeve on Saturday and I slept in it overnight. Sunday I did not wear my sleeve at all.    Pertinent History In September 2015 (02/19/14), had right lumpectomy and ALND (19 nodes).  Right arm swelling started pretty soon after surgery; she has been seen here before and has done manual lymph drainage but has not had a compression sleeve.  No other treatment except now on Arimidex.  HTN controlled with meds.     Patient Stated Goals Wants to get a sleeve for day- and nighttime.     Currently in Pain? No/denies   Pain Score 0-No pain                         [facility] Adult PT Treatment/Exercise - 10/24/15 0001    Manual Therapy   Manual Lymphatic Drainage (MLD) In supine, short neck, superficial and deep abdominals. right inquinal nodes and axillo inguinal anastamosis, left axillary nodes and anterior interaxillary anastamosis, left shoulder upper arm, elbow and forearm with return along pathways. then to sidelying for posterior interaxillary anasamosis, upper back and lateral trunk    Compression Bandaging thick stockinette, transelast to fingers,  2 pieces of big dotted medi foam at posterior upper arm and proximal forearm  artiflex and 4 short stretch bandages from hand to axilla with herringbone pattern at forearm  [facility] Goals - 10/21/15 1146    CC Long Term Goal  #1   Title Patient will be independent in correctly performing self-manual lymph drainage for the right UE. and use of compression for self managment    Baseline 10/21/15- Pt has been limited because her arm has been bandaged in between session, pt awaiting arrival of compression garments   Time 4   Period Weeks   Status On-going   CC Long Term Goal  #2   Title Patient will have been to [facility] (or have made an appointment there) to be fitted for garments.   Baseline 10/21/15- Pt has an appointment next Monday to be fitted for a sports bra   Time 2   Period Weeks   Status On-going   CC Long Term Goal  #3   Title Pt will have a reduction in right UE at  10 cm proximal to ulnar styloid of 1.5 cm    Baseline 29 on 09/28/2015, 24 cm on 10/21/15   Status Achieved   CC Long Term Goal  #4   Title report overall pain decreased >/= 50 % to tolerate daily tasks with less pain   Status Achieved   CC Long Term Goal  #5   Title increase right shoulder flexion range of motion to >/= 130 degrees fo rincreased functional use of arm   Status Achieved   CC Long Term Goal  #6   Title increase right shoulder abduction range of motion to >/= 130 degrees for increased functional use of arm   Status Achieved            Plan - Nov 09, 2015 1523    Clinical Impression Statement Pt was without bandages over the weekend because she attended a graduation on Friday. She did wear her sleeve on Saturday and during the night Saturday. Awaiting pricing on order placed for custom compression garments. MLD and compression bandaging performed today.    Rehab Potential Good   Clinical Impairments Affecting Rehab Potential axillary node dissection    PT Frequency 3x / week   PT Duration 4 weeks   PT Treatment/Interventions Manual techniques;Patient/family education;ADLs/Self Care Home Management   PT Next Visit Plan check status of garment order, continue MLD and bandaging   Consulted and Agree with Plan of Care Patient      Patient will benefit from skilled therapeutic intervention in order to improve the following deficits and impairments:  Increased edema, Decreased knowledge of precautions, Decreased knowledge of use of DME  Visit Diagnosis: Lymphedema, not elsewhere classified       G-Codes - Nov 09, 2015 1524    Functional Assessment Tool Used per clinical judgement   Functional Limitation Self care   Self Care Current Status [id number]) At least 20 percent but less than 40 percent impaired, limited or restricted   Self Care Goal Status [id number]) At least 1 percent but less than 20 percent impaired, limited or restricted      Problem List Patient Active Problem  List   Diagnosis Date Noted  . Osteoporosis 02/22/2015  . CKD (chronic kidney disease), stage III 08/16/2014  . Breast cancer of upper-outer quadrant of right female breast ([place]) 03/22/2014  . Breast cancer-right s/p right partial mastectomy and right axillary LND 02/19/14 02/03/2014  . Dizziness and giddiness 11/27/2012  . Essential tremor 11/27/2012  . Numbness 11/27/2012  . TOBACCO USER 10/04/2009  . CLOSED FRACTURE OF ONE RIB 07/12/2009  . HYPERLIPIDEMIA TYPE IIB / III 09/28/2008  .  CAD, NATIVE VESSEL 09/28/2008  . ALLERGIC RHINITIS 09/09/2008  . EMPHYSEMA 09/07/2008  . HYPERTENSION 09/06/2008  . SKIN CANCER, HX OF 09/06/2008    Alexia Freestone 10/24/2015, 3:25 PM  [place], [place], [address] Phone: [phone]   Fax:  [phone]  Name: EDYNN KINLOCH MRN: [id number] Date of Birth: 09-May-1946    Allyson Sabal, PT 10/24/2015 3:25 PM

## 2015-10-25 ENCOUNTER — Encounter: Payer: Medicare Other | Admitting: Physical Therapy

## 2015-10-26 ENCOUNTER — Ambulatory Visit: Payer: Medicare Other

## 2015-10-26 DIAGNOSIS — I890 Lymphedema, not elsewhere classified: Secondary | ICD-10-CM

## 2015-10-26 DIAGNOSIS — R29898 Other symptoms and signs involving the musculoskeletal system: Secondary | ICD-10-CM | POA: Diagnosis not present

## 2015-10-26 NOTE — Therapy (Signed)
[place], [place], [address] Phone: [phone]   Fax:  [phone]  Physical Therapy Treatment  Patient Details  Name: Brittany Kirk MRN: [id number] Date of Birth: 26-Jul-1945 Referring Provider: Dr. [name]  Encounter Date: 10/26/2015      PT End of Session - 10/26/15 1358    Visit Number 11   Number of Visits 13   Date for PT Re-Evaluation 10/28/15   PT Start Time 1304   PT Stop Time 1351   PT Time Calculation (min) 47 min   Activity Tolerance Patient tolerated treatment well   Behavior During Therapy [facility] for tasks assessed/performed      Past Medical History  Diagnosis Date  . Hyperlipidemia   . Hypertension   . Depression   . Tobacco user   . History of skin cancer   . Allergic rhinitis   . Cancer ([place])   . CAD (coronary artery disease)   . Emphysema     no longer uses inhalers  . Shortness of breath     sob if walks up flight of stairs  . Gallstones     nausea, pain upper right abdomen  . H/O hiatal hernia   . GERD (gastroesophageal reflux disease)   . Complication of anesthesia     sore throat after a surgery  . Asymptomatic bilateral carotid artery stenosis   . Wears dentures     top    Past Surgical History  Procedure Laterality Date  . Tubal ligation    . Tonsilectomy, adenoidectomy, bilateral myringotomy and tubes    . Cholecystectomy  07/08/2012    Procedure: LAPAROSCOPIC CHOLECYSTECTOMY WITH INTRAOPERATIVE CHOLANGIOGRAM;  Surgeon: Gayland Curry, MD,FACS;  Location: [facility];  Service: General;  Laterality: N/A;  Laparoscopic Cholecystectomy with Intraoperative Cholangiogram  . Colonoscopy      There were no vitals filed for this visit.      Subjective Assessment - 10/26/15 1312    Subjective I kept the bandages on but I can't have them on after todays appt. I have to clean my house, I'll wear my sleeve instead. I did get measured for new bras the other  day and got 6 of them.    Pertinent History In September 2015 (02/19/14), had right lumpectomy and ALND (19 nodes).  Right arm swelling started pretty soon after surgery; she has been seen here before and has done manual lymph drainage but has not had a compression sleeve.  No other treatment except now on Arimidex.  HTN controlled with meds.     Patient Stated Goals Wants to get a sleeve for day- and nighttime.     Currently in Pain? No/denies               LYMPHEDEMA/ONCOLOGY QUESTIONNAIRE - 10/26/15 1313    Right Upper Extremity Lymphedema   15 cm Proximal to Olecranon Process 33.9 cm   10 cm Proximal to Olecranon Process 35.2 cm   Olecranon Process 30.6 cm   15 cm Proximal to Ulnar Styloid Process 29.6 cm   10 cm Proximal to Ulnar Styloid Process 26.2 cm   Just Proximal to Ulnar Styloid Process 19.6 cm   Across Hand at [employer] 17.3 cm   At [place] of 2nd Digit 6 cm                  [facility] Adult PT Treatment/Exercise - 10/26/15 0001    Manual Therapy   Manual Lymphatic  Drainage (MLD) In supine, short neck, superficial and deep abdominals. right inquinal nodes and axillo inguinal anastamosis, left axillary nodes and anterior interaxillary anastamosis, right shoulder upper arm, elbow and forearm with return along pathways. then to sidelying for posterior interaxillary anasamosis, upper back and lateral trunk                         [facility] Term Clinic Goals - 10/26/15 1508    CC Long Term Goal  #1   Title Patient will be independent in correctly performing self-manual lymph drainage for the right UE. and use of compression for self managment    Status On-going   CC Long Term Goal  #2   Title Patient will have been to [facility] (or have made an appointment there) to be fitted for garments.   Baseline 10/21/15- Pt has an appointment next Monday to be fitted for a sports bra; 10/26/15 Pt reported has gone and been fitted for 6 new bras [facility]  [employer].   Status Achieved            Plan - 10/26/15 1359    Clinical Impression Statement Pt didn't want to be bandaged today reporting she needed to clean her house and couldn't with bandages on. Her circumference measurements reduced very well from last weeks measurements except wrist was only area elevated. Pts compression garments were delivered to our clinic today but it is unclear if the pt or hospital was billed so have to keep garments until we can find out who was billed. If hospital they have to be sent back to bill pt properly.    Rehab Potential Good   Clinical Impairments Affecting Rehab Potential axillary node dissection    PT Frequency 3x / week   PT Duration 4 weeks   PT Treatment/Interventions Manual techniques;Patient/family education;ADLs/Self Care Home Management   PT Next Visit Plan check status of garment order, continue MLD and bandaging   Consulted and Agree with Plan of Care Patient      Patient will benefit from skilled therapeutic intervention in order to improve the following deficits and impairments:  Increased edema, Decreased knowledge of precautions, Decreased knowledge of use of DME  Visit Diagnosis: Lymphedema, not elsewhere classified  Other symptoms and signs involving the musculoskeletal system     Problem List Patient Active Problem List   Diagnosis Date Noted  . Osteoporosis 02/22/2015  . CKD (chronic kidney disease), stage III 08/16/2014  . Breast cancer of upper-outer quadrant of right female breast ([place]) 03/22/2014  . Breast cancer-right s/p right partial mastectomy and right axillary LND 02/19/14 02/03/2014  . Dizziness and giddiness 11/27/2012  . Essential tremor 11/27/2012  . Numbness 11/27/2012  . TOBACCO USER 10/04/2009  . CLOSED FRACTURE OF ONE RIB 07/12/2009  . HYPERLIPIDEMIA TYPE IIB / III 09/28/2008  . CAD, NATIVE VESSEL 09/28/2008  . ALLERGIC RHINITIS 09/09/2008  . EMPHYSEMA 09/07/2008  . HYPERTENSION 09/06/2008  .  SKIN CANCER, HX OF 09/06/2008    Otelia Limes, PTA 10/26/2015, 3:10 PM  [place], [place], [address] Phone: [phone]   Fax:  [phone]  Name: Brittany Kirk MRN: [id number] Date of Birth: April 24, 1946

## 2015-10-27 ENCOUNTER — Encounter: Payer: Medicare Other | Admitting: Physical Therapy

## 2015-10-27 ENCOUNTER — Encounter: Payer: Self-pay | Admitting: Physical Therapy

## 2015-10-27 ENCOUNTER — Ambulatory Visit: Payer: Medicare Other | Admitting: Physical Therapy

## 2015-10-27 DIAGNOSIS — I890 Lymphedema, not elsewhere classified: Secondary | ICD-10-CM | POA: Diagnosis not present

## 2015-10-27 DIAGNOSIS — R29898 Other symptoms and signs involving the musculoskeletal system: Secondary | ICD-10-CM | POA: Diagnosis not present

## 2015-10-27 NOTE — Therapy (Signed)
[place], [place], [address] Phone: [phone]   Fax:  [phone]  Physical Therapy Treatment  Patient Details  Name: Brittany Kirk MRN: [id number] Date of Birth: 1945/10/16 Referring Provider: Dr. [name]  Encounter Date: 10/27/2015      PT End of Session - 10/27/15 1104    Visit Number 12   Number of Visits 13   Date for PT Re-Evaluation 10/28/15   PT Start Time 1038   PT Stop Time 1100   PT Time Calculation (min) 22 min   Activity Tolerance Patient tolerated treatment well   Behavior During Therapy [facility] for tasks assessed/performed      Past Medical History  Diagnosis Date  . Hyperlipidemia   . Hypertension   . Depression   . Tobacco user   . History of skin cancer   . Allergic rhinitis   . Cancer ([place])   . CAD (coronary artery disease)   . Emphysema     no longer uses inhalers  . Shortness of breath     sob if walks up flight of stairs  . Gallstones     nausea, pain upper right abdomen  . H/O hiatal hernia   . GERD (gastroesophageal reflux disease)   . Complication of anesthesia     sore throat after a surgery  . Asymptomatic bilateral carotid artery stenosis   . Wears dentures     top    Past Surgical History  Procedure Laterality Date  . Tubal ligation    . Tonsilectomy, adenoidectomy, bilateral myringotomy and tubes    . Cholecystectomy  07/08/2012    Procedure: LAPAROSCOPIC CHOLECYSTECTOMY WITH INTRAOPERATIVE CHOLANGIOGRAM;  Surgeon: Gayland Curry, MD,FACS;  Location: [facility];  Service: General;  Laterality: N/A;  Laparoscopic Cholecystectomy with Intraoperative Cholangiogram  . Colonoscopy      There were no vitals filed for this visit.      Subjective Assessment - 10/27/15 1102    Subjective It was so nice not having the bandages on. I could use my arm and cook dinner last night.    Pertinent History In September 2015 (02/19/14), had right lumpectomy and  ALND (19 nodes).  Right arm swelling started pretty soon after surgery; she has been seen here before and has done manual lymph drainage but has not had a compression sleeve.  No other treatment except now on Arimidex.  HTN controlled with meds.     Patient Stated Goals Wants to get a sleeve for day- and nighttime.     Currently in Pain? No/denies   Pain Score 0-No pain               LYMPHEDEMA/ONCOLOGY QUESTIONNAIRE - 10/26/15 1313    Right Upper Extremity Lymphedema   15 cm Proximal to Olecranon Process 33.9 cm   10 cm Proximal to Olecranon Process 35.2 cm   Olecranon Process 30.6 cm   15 cm Proximal to Ulnar Styloid Process 29.6 cm   10 cm Proximal to Ulnar Styloid Process 26.2 cm   Just Proximal to Ulnar Styloid Process 19.6 cm   Across Hand at [employer] 17.3 cm   At [place] of 2nd Digit 6 cm                          PT Education - 10/27/15 1103    Education provided Yes   Education Details how to don/doff new velcro compression sleeve and  glove for long term management of edema   Person(s) Educated Patient   Methods Explanation;Demonstration;Tactile cues;Verbal cues   Comprehension Verbalized understanding                Long Term Clinic Goals - 11/22/15 1106    CC Long Term Goal  #1   Title Patient will be independent in correctly performing self-manual lymph drainage for the right UE. and use of compression for self managment    Baseline 10/21/15- Pt has been limited because her arm has been bandaged in between session, pt awaiting arrival of compression garments, 2015/11/22- Pt is now able to independently manage her RUE lymphedema. She received custom velcro compression sleeve and glove and was educated in proper donning/doffing. She has been educated in all tools for self management of lymphedema.    Time 4   Period Weeks   Status Achieved   CC Long Term Goal  #2   Title Patient will have been to [facility] (or have made an  appointment there) to be fitted for garments.   Baseline 10/21/15- Pt has an appointment next Monday to be fitted for a sports bra; 10/26/15 Pt reported has gone and been fitted for 6 new bras at [facility] [employer] [facility].   Time 2   Period Weeks   Status Achieved   CC Long Term Goal  #3   Title Pt will have a reduction in right UE at 10 cm proximal to ulnar styloid of 1.5 cm    Baseline 29 on 09/28/2015, 24 cm on 10/21/15   Status Achieved   CC Long Term Goal  #5   Title increase right shoulder flexion range of motion to >/= 130 degrees fo rincreased functional use of arm   Status Achieved   CC Long Term Goal  #6   Title increase right shoulder abduction range of motion to >/= 130 degrees for increased functional use of arm   Status Achieved            Plan - 11/22/2015 1104    Clinical Impression Statement Pt received her velcro compression arm sleeve and glove. Assessed both for proper fit. Pt's garments fit well and pt was educated in proper donning/doffing of garments. Pt to be discharged from skilled PT services today because pt is now able to independently manage her RUE lymphedema.    Rehab Potential Good   Clinical Impairments Affecting Rehab Potential axillary node dissection    PT Frequency 3x / week   PT Duration 4 weeks   PT Next Visit Plan d/c today   Consulted and Agree with Plan of Care Patient      Patient will benefit from skilled therapeutic intervention in order to improve the following deficits and impairments:  Increased edema, Decreased knowledge of precautions, Decreased knowledge of use of DME  Visit Diagnosis: Lymphedema, not elsewhere classified       G-Codes - 2015/11/22 1107    Functional Assessment Tool Used per clinical judgement   Functional Limitation Self care   Self Care Current Status (J6734) At least 1 percent but less than 20 percent impaired, limited or restricted   Self Care Goal Status (L9379) At least 1 percent but less than 20 percent impaired,  limited or restricted   Self Care Discharge Status [id number]) At least 1 percent but less than 20 percent impaired, limited or restricted      Problem List Patient Active Problem List   Diagnosis Date Noted  . Osteoporosis 02/22/2015  .  CKD (chronic kidney disease), stage III 08/16/2014  . Breast cancer of upper-outer quadrant of right female breast ([place]) 03/22/2014  . Breast cancer-right s/p right partial mastectomy and right axillary LND 02/19/14 02/03/2014  . Dizziness and giddiness 11/27/2012  . Essential tremor 11/27/2012  . Numbness 11/27/2012  . TOBACCO USER 10/04/2009  . CLOSED FRACTURE OF ONE RIB 07/12/2009  . HYPERLIPIDEMIA TYPE IIB / III 09/28/2008  . CAD, NATIVE VESSEL 09/28/2008  . ALLERGIC RHINITIS 09/09/2008  . EMPHYSEMA 09/07/2008  . HYPERTENSION 09/06/2008  . SKIN CANCER, HX OF 09/06/2008    Alexia Freestone 10/27/2015, 12:08 PM  [place], [place], [address] Phone: [phone]   Fax:  [phone]  Name: Brittany Kirk MRN: [id number] Date of Birth: May 06, 1946    Allyson Sabal, PT 10/27/2015 12:09 PM   PHYSICAL THERAPY DISCHARGE SUMMARY  Visits from Start of Care: 12 Current functional level related to goals / functional outcomes: Pt is now able to independently manage her lymphedema through custom velcro compression sleeve and glove. She demonstrates decrease of RUE circumferential measurements.    Remaining deficits: None   Education / Equipment: Donning/doffing velcro compression garment Plan: Patient agrees to discharge.  Patient goals were met. Patient is being discharged due to meeting the stated rehab goals.  ?????

## 2015-11-08 DIAGNOSIS — H5202 Hypermetropia, left eye: Secondary | ICD-10-CM | POA: Diagnosis not present

## 2015-11-08 DIAGNOSIS — H25819 Combined forms of age-related cataract, unspecified eye: Secondary | ICD-10-CM | POA: Diagnosis not present

## 2015-11-08 DIAGNOSIS — H43399 Other vitreous opacities, unspecified eye: Secondary | ICD-10-CM | POA: Diagnosis not present

## 2015-11-08 DIAGNOSIS — H5211 Myopia, right eye: Secondary | ICD-10-CM | POA: Diagnosis not present

## 2015-11-08 DIAGNOSIS — H52223 Regular astigmatism, bilateral: Secondary | ICD-10-CM | POA: Diagnosis not present

## 2015-11-21 ENCOUNTER — Telehealth: Payer: Self-pay | Admitting: Family Medicine

## 2015-11-21 MED ORDER — HYDROCODONE-ACETAMINOPHEN 5-325 MG PO TABS: 1.0000 | ORAL_TABLET | Freq: Four times a day (QID) | ORAL | Status: DC | PRN

## 2015-11-21 NOTE — Telephone Encounter (Signed)
RX printed, left up front and patient aware to pick up in am 

## 2015-11-21 NOTE — Telephone Encounter (Signed)
Ok to refill 

## 2015-11-21 NOTE — Telephone Encounter (Signed)
ok 

## 2015-11-21 NOTE — Telephone Encounter (Signed)
Pt is requesting a refill of Hydrocodone [phone]

## 2015-11-29 ENCOUNTER — Encounter: Payer: Self-pay | Admitting: Family Medicine

## 2015-11-29 ENCOUNTER — Ambulatory Visit (INDEPENDENT_AMBULATORY_CARE_PROVIDER_SITE_OTHER): Payer: Medicare Other | Admitting: Family Medicine

## 2015-11-29 VITALS — BP 152/70 | HR 78 | Temp 98.0°F | Resp 18 | Ht 61.0 in | Wt 156.0 lb

## 2015-11-29 DIAGNOSIS — R0789 Other chest pain: Secondary | ICD-10-CM | POA: Diagnosis not present

## 2015-11-29 MED ORDER — ONDANSETRON HCL 4 MG PO TABS: 4.0000 mg | ORAL_TABLET | Freq: Three times a day (TID) | ORAL | Status: DC | PRN

## 2015-11-29 MED ORDER — METOPROLOL SUCCINATE ER 25 MG PO TB24: 25.0000 mg | ORAL_TABLET | Freq: Every day | ORAL | Status: DC

## 2015-11-29 NOTE — Progress Notes (Signed)
Subjective:    Patient ID: Brittany Kirk, female    DOB: [birth year], 70 y.o.   MRN: [id number]  HPI Patient has been having atypical chest pain for several weeks. The pain is subxiphoid in location. It is unrelated to exertion. It seems to be worse with position. She denies any angina whatsoever. She does report some shortness of breath when she lays down but she denies any significant dyspnea on exertion. EKG today shows normal sinus rhythm with no evidence of ischemia or infarction. There are Q waves in lead 3 however these are present on EKG in 2014 for these are old. There is no significant change otherwise. There are no exacerbating or alleviating factors. On Thursday she developed a severe stomach virus with nausea vomiting and severe diarrhea. She lost substantial wages secondary to the vomiting and diarrhea. Since Thursday she has not had any of the chest pain raising the suspicion that it could be stomach related.  I believe this is so because since Thursday she has not eating very much because the nausea vomiting and diarrhea. The pain has completely subsided but she's not eating. Past medical history significant for CAD and a branch off the LAD. This was diagnosed prior to 2011. At that time it was only amenable for surgery and given the fact she was relatively asymptomatic no intervention was taken Past Medical History  Diagnosis Date  . Hyperlipidemia   . Hypertension   . Depression   . Tobacco user   . History of skin cancer   . Allergic rhinitis   . Cancer ([place])   . CAD (coronary artery disease)   . Emphysema     no longer uses inhalers  . Shortness of breath     sob if walks up flight of stairs  . Gallstones     nausea, pain upper right abdomen  . H/O hiatal hernia   . GERD (gastroesophageal reflux disease)   . Complication of anesthesia     sore throat after a surgery  . Asymptomatic bilateral carotid artery stenosis   . Wears dentures     top   Past Surgical  History  Procedure Laterality Date  . Tubal ligation    . Tonsilectomy, adenoidectomy, bilateral myringotomy and tubes    . Cholecystectomy  07/08/2012    Procedure: LAPAROSCOPIC CHOLECYSTECTOMY WITH INTRAOPERATIVE CHOLANGIOGRAM;  Surgeon: Gayland Curry, MD,FACS;  Location: [facility];  Service: General;  Laterality: N/A;  Laparoscopic Cholecystectomy with Intraoperative Cholangiogram  . Colonoscopy     Current Outpatient Prescriptions on File Prior to Visit  Medication Sig Dispense Refill  . anastrozole (ARIMIDEX) 1 MG tablet Take 1 tablet (1 mg total) by mouth daily. 90 tablet 4  . aspirin 81 MG tablet Take 81 mg by mouth daily.    . Cholecalciferol (VITAMIN D3) 5000 UNITS CAPS Take 5,000 Units by mouth daily.    . cyanocobalamin 2000 MCG tablet Take 2,000 mcg by mouth daily. B12 slow release tablet daily    . diazepam (VALIUM) 5 MG tablet TAKE 1 TABLET BY MOUTH AT BEDTIME AS NEEDED FOR ANXIETY  3  . doxycycline (VIBRA-TABS) 100 MG tablet Take 1 tablet (100 mg total) by mouth 2 (two) times daily. (Patient not taking: Reported on 10/13/2015) 20 tablet 0  . escitalopram (LEXAPRO) 20 MG tablet Take 20 mg by mouth daily.    Marland Kitchen esomeprazole (NEXIUM) 40 MG capsule Take 40 mg by mouth daily at 12 noon.    Marland Kitchen HYDROcodone-acetaminophen (NORCO) 5-325  MG tablet Take 1 tablet by mouth every 6 (six) hours as needed for moderate pain. 30 tablet 0  . losartan (COZAAR) 50 MG tablet TAKE 1 TABLET EVERY DAY 90 tablet 3  . Multiple Vitamin (MULTIVITAMIN) tablet Take 1 tablet by mouth daily.    . predniSONE (DELTASONE) 20 MG tablet 3 tabs po qd x 2 days, 2 tabs po qd x 2 days, 1 tab po qd x 2 days (Patient not taking: Reported on 10/13/2015) 12 tablet 0   No current facility-administered medications on file prior to visit.   Allergies  Allergen Reactions  . Latex   . Cefaclor Itching, Swelling and Rash  . Metronidazole Itching, Swelling and Rash  . Naproxen Itching, Swelling and Rash  . Nsaids Itching, Swelling  and Rash    Can take advil  . Penicillins Itching, Swelling and Rash  . Septra [Bactrim] Itching, Swelling and Rash  . Sulfonamide Derivatives Itching, Swelling and Rash   Social History   Social History  . Marital Status: Married    Spouse Name: N/A  . Number of Children: 1  . Years of Education: N/A   Occupational History  . 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     Social History Main Topics  . Smoking status: Former Smoker -- 0.50 packs/day for 40 years    Types: Cigarettes    Quit date: 02/12/2013  . Smokeless tobacco: Never Used  . Alcohol Use: No  . Drug Use: No  . Sexual Activity: Not on file   Other Topics Concern  . Not on file   Social History Narrative     Review of Systems  All other systems reviewed and are negative.      Objective:   Physical Exam  Constitutional: She appears well-developed and well-nourished. No distress.  Neck: Neck supple. No JVD present.  Cardiovascular: Normal rate, regular rhythm and normal heart sounds.   No murmur heard. Pulmonary/Chest: Effort normal and breath sounds normal. No respiratory distress. She has no wheezes. She has no rales.  Abdominal: Soft. Bowel sounds are normal. She exhibits no distension. There is no tenderness. There is no rebound and no guarding.  Musculoskeletal: She exhibits no edema.  Lymphadenopathy:    She has no cervical adenopathy.  Skin: She is not diaphoretic.  Vitals reviewed.         Assessment & Plan:  Other chest pain - Plan: EKG 12-Lead, ondansetron (ZOFRAN) 4 MG tablet, metoprolol succinate (TOPROL-XL) 25 MG 24 hr tablet, Ambulatory referral to Cardiology   Chest pain is very atypical. There is no exertional component. EKG is unchanged from 2014. Pain improved with a BRAT diet. All this leads me to believe that the pain is likely gastrointestinal related. I recommended that she increase her Prilosec to twice a day as I believe the pain will likely come back once she starts eating  better. I will treat her recent viral gastroenteritis with Zofran 4 mg every 8 hours as needed for nausea. I would like to treat her blood pressure by adding Toprol-XL 25 mg by mouth daily. However given her significant past history and her age and the fact she is reporting some shortness of breath, I would like to consult cardiology for possible stress test to reevaluate the lesion she has in the LAD.

## 2015-12-22 DIAGNOSIS — R110 Nausea: Secondary | ICD-10-CM | POA: Diagnosis not present

## 2016-01-13 ENCOUNTER — Ambulatory Visit (INDEPENDENT_AMBULATORY_CARE_PROVIDER_SITE_OTHER): Payer: Medicare Other | Admitting: Cardiovascular Disease

## 2016-01-13 ENCOUNTER — Encounter: Payer: Self-pay | Admitting: Cardiovascular Disease

## 2016-01-13 VITALS — BP 148/74 | HR 64 | Ht 61.0 in | Wt 160.8 lb

## 2016-01-13 DIAGNOSIS — R079 Chest pain, unspecified: Secondary | ICD-10-CM | POA: Diagnosis not present

## 2016-01-13 DIAGNOSIS — R0989 Other specified symptoms and signs involving the circulatory and respiratory systems: Secondary | ICD-10-CM

## 2016-01-13 NOTE — Progress Notes (Signed)
01/13/2016 Colin Benton Berlinger   [birth year]  [id number]  Primary Physician Odette Fraction, MD Primary Cardiologist: Lorretta Harp MD Renae Gloss  HPI:  Mrs. 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 She was referred by Dr. Dennard Schaumann for cardiovascular evaluation because of chest pain. Risk factors include 25 pack years of tobacco abuse having quit 3 years ago. She has treated hypertension as well as a strong family history of heart disease with multiple siblings who died at a premature age of myocardial infarction's. She has never had a heart attack or stroke. She has had a remote cardiac catheterization by Dr. Lia Foyer but no intervention was performed. She was complaining of some substernal chest pain with chronic nausea. This has since some sided after beginning on a proton pump inhibitor.   Current Outpatient Prescriptions  Medication Sig Dispense Refill  . anastrozole (ARIMIDEX) 1 MG tablet Take 1 tablet (1 mg total) by mouth daily. 90 tablet 4  . aspirin 81 MG tablet Take 81 mg by mouth daily.    . Cholecalciferol (VITAMIN D3) 5000 UNITS CAPS Take 5,000 Units by mouth daily.    . cyanocobalamin 2000 MCG tablet Take 2,000 mcg by mouth daily. B12 slow release tablet daily    . diazepam (VALIUM) 5 MG tablet TAKE 1 TABLET BY MOUTH AT BEDTIME AS NEEDED FOR ANXIETY  3  . escitalopram (LEXAPRO) 20 MG tablet Take 20 mg by mouth daily.    Marland Kitchen esomeprazole (NEXIUM) 40 MG capsule Take 40 mg by mouth daily at 12 noon.    Marland Kitchen HYDROcodone-acetaminophen (NORCO) 5-325 MG tablet Take 1 tablet by mouth every 6 (six) hours as needed for moderate pain. 30 tablet 0  . losartan (COZAAR) 50 MG tablet TAKE 1 TABLET EVERY DAY 90 tablet 3  . metoprolol succinate (TOPROL-XL) 25 MG 24 hr tablet Take 12.5 mg by mouth daily.    . Multiple Vitamin (MULTIVITAMIN) tablet Take  1 tablet by mouth daily.     No current facility-administered medications for this visit.     Allergies  Allergen Reactions  . Latex   . Cefaclor Itching, Swelling and Rash  . Metronidazole Itching, Swelling and Rash  . Naproxen Itching, Swelling and Rash  . Nsaids Itching, Swelling and Rash    Can take advil  . Penicillins Itching, Swelling and Rash  . Septra [Bactrim] Itching, Swelling and Rash  . Sulfonamide Derivatives Itching, Swelling and Rash    Social History   Social History  . Marital status: Married    Spouse name: N/A  . Number of children: 1  . Years of education: N/A   Occupational History  . 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  Retired   Social History Main Topics  . Smoking status: Former Smoker    Packs/day: 0.50    Years: 40.00    Types: Cigarettes    Quit date: 02/12/2013  . Smokeless tobacco: Never Used  . Alcohol use No  . Drug use: No  . Sexual activity: Not on file   Other Topics Concern  . Not on file   Social History Narrative  . No narrative on file     Review of Systems: General: negative for chills, fever, night sweats or weight changes.  Cardiovascular: negative for chest pain, dyspnea on exertion, edema, orthopnea, palpitations, paroxysmal nocturnal dyspnea or shortness of breath Dermatological:  negative for rash Respiratory: negative for cough or wheezing Urologic: negative for hematuria Abdominal: negative for nausea, vomiting, diarrhea, bright red blood per rectum, melena, or hematemesis Neurologic: negative for visual changes, syncope, or dizziness All other systems reviewed and are otherwise negative except as noted above.    Blood pressure (!) 148/74, pulse 64, height 5\' 1"  (1.549 m), weight 160 lb 12.8 oz (72.9 kg).  General appearance: alert and no distress Neck: no adenopathy, no carotid bruit, no JVD, supple, symmetrical, trachea midline and thyroid not enlarged, symmetric, no tenderness/mass/nodules Lungs: clear to  auscultation bilaterally Heart: regular rate and rhythm, S1, S2 normal, no murmur, click, rub or gallop Extremities: extremities normal, atraumatic, no cyanosis or edema  EKG not performed today  ASSESSMENT AND PLAN:   HYPERLIPIDEMIA TYPE IIB / III History of hyperlipidemia currently not on statin therapy followed by her PCP  TOBACCO USER History of 25 pack years of tobacco abuse having quit 3 years ago  Essential hypertension History of hypertension blood pressure measured today at 140/74. She is on losartan and metoprolol. Continue current meds at current dosing  Chest pain The patient was having constant nausea and substernal chest pain with radiation to her back and left shoulder. This has subsequently improved with the addition of Nexium. This was thought to be gastrointestinal in nature related to hiatal hernia. She has had a remote cardiac cath by Dr. Lia Foyer but no intervention was done. I'm going to order a oncologic Myoview stress test to risk stratify her and rule out an ischemic etiology.      Lorretta Harp MD FACP,FACC,FAHA, [facility] 01/13/2016 11:43 AM

## 2016-01-13 NOTE — Assessment & Plan Note (Signed)
History of hyperlipidemia currently not on statin therapy followed by her PCP

## 2016-01-13 NOTE — Assessment & Plan Note (Signed)
The patient was having constant nausea and substernal chest pain with radiation to her back and left shoulder. This has subsequently improved with the addition of Nexium. This was thought to be gastrointestinal in nature related to hiatal hernia. She has had a remote cardiac cath by Dr. Lia Foyer but no intervention was done. I'm going to order a oncologic Myoview stress test to risk stratify her and rule out an ischemic etiology.

## 2016-01-13 NOTE — Assessment & Plan Note (Signed)
History of hypertension blood pressure measured today at 140/74. She is on losartan and metoprolol. Continue current meds at current dosing

## 2016-01-13 NOTE — Patient Instructions (Signed)
Medication Instructions:  Your physician recommends that you continue on your current medications as directed. Please refer to the Current Medication list given to you today.   Labwork:   Testing/Procedures: Your physician has requested that you have a carotid duplex. This test is an ultrasound of the carotid arteries in your neck. It looks at blood flow through these arteries that supply the brain with blood. Allow one hour for this exam. There are no restrictions or special instructions.  Your physician has requested that you have a lexiscan myoview. For further information please visit [email or web address]. Please follow instruction sheet, as given.    Follow-Up: Your physician recommends that you schedule a follow-up appointment in 2-4 WEEKS WITH DR BERRY AFTER TESTS ARE COMPLETED.  Pharmacologic Stress Electrocardiogram A pharmacologic stress electrocardiogram is a heart (cardiac) test that uses nuclear imaging to evaluate the blood supply to your heart. This test may also be called a pharmacologic stress electrocardiography. Pharmacologic means that a medicine is used to increase your heart rate and blood pressure.  This stress test is done to find areas of poor blood flow to the heart by determining the extent of coronary artery disease (CAD). Some people exercise on a treadmill, which naturally increases the blood flow to the heart. For those people unable to exercise on a treadmill, a medicine is used. This medicine stimulates your heart and will cause your heart to beat harder and more quickly, as if you were exercising.  Pharmacologic stress tests can help determine:  The adequacy of blood flow to your heart during increased levels of activity in order to clear you for discharge home.  The extent of coronary artery blockage caused by CAD.  Your prognosis if you have suffered a heart attack.  The effectiveness of cardiac procedures done, such as an angioplasty, which can increase  the circulation in your coronary arteries.  Causes of chest pain or pressure. LET [facility] CARE PROVIDER KNOW ABOUT:  Any allergies you have.  All medicines you are taking, including vitamins, herbs, eye drops, creams, and over-the-counter medicines.  Previous problems you or members of your family have had with the use of anesthetics.  Any blood disorders you have.  Previous surgeries you have had.  Medical conditions you have.  Possibility of pregnancy, if this applies.  If you are currently breastfeeding. RISKS AND COMPLICATIONS Generally, this is a safe procedure. However, as with any procedure, complications can occur. Possible complications include:  You develop pain or pressure in the following areas:  Chest.  Jaw or neck.  Between your shoulder blades.  Radiating down your left arm.  Headache.  Dizziness or light-headedness.  Shortness of breath.  Increased or irregular heartbeat.  Low blood pressure.  Nausea or vomiting.  Flushing.  Redness going up the arm and slight pain during injection of medicine.  Heart attack (rare). BEFORE THE PROCEDURE   Avoid all forms of caffeine for 24 hours before your test or as directed by your health care provider. This includes coffee, tea (even decaffeinated tea), caffeinated sodas, chocolate, cocoa, and certain pain medicines.  Follow your health care provider's instructions regarding eating and drinking before the test.  Take your medicines as directed at regular times with water unless instructed otherwise. Exceptions may include:  If you have diabetes, ask how you are to take your insulin or pills. It is common to adjust insulin dosing the morning of the test.  If you are taking beta-blocker medicines, it is important to talk to  your health care provider about these medicines well before the date of your test. Taking beta-blocker medicines may interfere with the test. In some cases, these medicines need to  be changed or stopped 24 hours or more before the test.  If you wear a nitroglycerin patch, it may need to be removed prior to the test. Ask your health care provider if the patch should be removed before the test.  If you use an inhaler for any breathing condition, bring it with you to the test.  If you are an outpatient, bring a snack so you can eat right after the stress phase of the test.  Do not smoke for 4 hours prior to the test or as directed by your health care provider.  Do not apply lotions, powders, creams, or oils on your chest prior to the test.  Wear comfortable shoes and clothing. Let your health care provider know if you were unable to complete or follow the preparations for your test. PROCEDURE   Multiple patches (electrodes) will be put on your chest. If needed, small areas of your chest may be shaved to get better contact with the electrodes. Once the electrodes are attached to your body, multiple wires will be attached to the electrodes, and your heart rate will be monitored.  An IV access will be started. A nuclear trace (isotope) is given. The isotope may be given intravenously, or it may be swallowed. Nuclear refers to several types of radioactive isotopes, and the nuclear isotope lights up the arteries so that the nuclear images are clear. The isotope is absorbed by your body. This results in low radiation exposure.  A resting nuclear image is taken to show how your heart functions at rest.  A medicine is given through the IV access.  A second scan is done about 1 hour after the medicine injection and determines how your heart functions under stress.  During this stress phase, you will be connected to an electrocardiogram machine. Your blood pressure and oxygen levels will be monitored. AFTER THE PROCEDURE   Your heart rate and blood pressure will be monitored after the test.  You may return to your normal schedule, including diet,activities, and medicines,  unless your health care provider tells you otherwise.   This information is not intended to replace advice given to you by your health care provider. Make sure you discuss any questions you have with your health care provider.   Document Released: 10/14/2008 Document Revised: 06/02/2013 Document Reviewed: 02/02/2013 Elsevier Interactive Patient Education [employer].    If you need a refill on your cardiac medications before your next appointment, please call your pharmacy.

## 2016-01-13 NOTE — Assessment & Plan Note (Signed)
History of 25 pack years of tobacco abuse having quit 3 years ago

## 2016-01-19 ENCOUNTER — Telehealth (HOSPITAL_COMMUNITY): Payer: Self-pay

## 2016-01-19 NOTE — Telephone Encounter (Signed)
Encounter complete. 

## 2016-01-24 ENCOUNTER — Ambulatory Visit (HOSPITAL_COMMUNITY)
Admission: RE | Admit: 2016-01-24 | Discharge: 2016-01-24 | Disposition: A | Discharge status: Home / Self Care | Payer: Medicare Other | Source: Ambulatory Visit | Attending: Cardiovascular Disease | Admitting: Cardiovascular Disease

## 2016-01-24 DIAGNOSIS — R42 Dizziness and giddiness: Secondary | ICD-10-CM | POA: Insufficient documentation

## 2016-01-24 DIAGNOSIS — R0609 Other forms of dyspnea: Secondary | ICD-10-CM | POA: Insufficient documentation

## 2016-01-24 DIAGNOSIS — I6523 Occlusion and stenosis of bilateral carotid arteries: Secondary | ICD-10-CM | POA: Diagnosis not present

## 2016-01-24 DIAGNOSIS — R079 Chest pain, unspecified: Secondary | ICD-10-CM | POA: Diagnosis not present

## 2016-01-24 DIAGNOSIS — Z720 Tobacco use: Secondary | ICD-10-CM | POA: Diagnosis not present

## 2016-01-24 DIAGNOSIS — I10 Essential (primary) hypertension: Secondary | ICD-10-CM | POA: Insufficient documentation

## 2016-01-24 DIAGNOSIS — K219 Gastro-esophageal reflux disease without esophagitis: Secondary | ICD-10-CM | POA: Insufficient documentation

## 2016-01-24 DIAGNOSIS — R0989 Other specified symptoms and signs involving the circulatory and respiratory systems: Secondary | ICD-10-CM | POA: Insufficient documentation

## 2016-01-24 DIAGNOSIS — R0602 Shortness of breath: Secondary | ICD-10-CM | POA: Diagnosis not present

## 2016-01-24 DIAGNOSIS — I2510 Atherosclerotic heart disease of native coronary artery without angina pectoris: Secondary | ICD-10-CM | POA: Insufficient documentation

## 2016-01-24 DIAGNOSIS — F329 Major depressive disorder, single episode, unspecified: Secondary | ICD-10-CM | POA: Diagnosis not present

## 2016-01-24 DIAGNOSIS — Z87891 Personal history of nicotine dependence: Secondary | ICD-10-CM | POA: Diagnosis not present

## 2016-01-24 DIAGNOSIS — E785 Hyperlipidemia, unspecified: Secondary | ICD-10-CM | POA: Insufficient documentation

## 2016-01-24 DIAGNOSIS — Z8249 Family history of ischemic heart disease and other diseases of the circulatory system: Secondary | ICD-10-CM | POA: Diagnosis not present

## 2016-01-24 LAB — MYOCARDIAL PERFUSION IMAGING
Estimated workload: 1 METS
LV dias vol: 72 mL (ref 46–106)
LV sys vol: 21 mL
Peak HR: 68 {beats}/min
Percent of predicted max HR: 45 %
Rest HR: 56 {beats}/min
SDS: 4
SRS: 0
SSS: 4
Stage 1 DBP: 68 mmHg
Stage 1 Grade: 0 %
Stage 1 HR: 56 {beats}/min
Stage 1 SBP: 171 mmHg
Stage 1 Speed: 0 mph
Stage 2 Grade: 0 %
Stage 2 HR: 56 {beats}/min
Stage 2 Speed: 0 mph
Stage 3 Grade: 0 %
Stage 3 HR: 68 {beats}/min
Stage 3 Speed: 0 mph
Stage 4 DBP: 66 mmHg
Stage 4 Grade: 0 %
Stage 4 HR: 65 {beats}/min
Stage 4 SBP: 193 mmHg
Stage 4 Speed: 0 mph
TID: 1.07

## 2016-01-24 MED ORDER — AMINOPHYLLINE 25 MG/ML IV SOLN
75.0000 mg | Freq: Once | INTRAVENOUS | Status: AC
  Administered 2016-01-24: 75 mg via INTRAVENOUS

## 2016-01-24 MED ORDER — REGADENOSON 0.4 MG/5ML IV SOLN
0.4000 mg | Freq: Once | INTRAVENOUS | Status: AC
  Administered 2016-01-24: 0.4 mg via INTRAVENOUS

## 2016-01-24 MED ORDER — TECHNETIUM TC 99M TETROFOSMIN IV KIT
10.7000 | PACK | Freq: Once | INTRAVENOUS | Status: AC | PRN
  Administered 2016-01-24: 11 via INTRAVENOUS
  Filled 2016-01-24: qty 11

## 2016-01-24 MED ORDER — TECHNETIUM TC 99M TETROFOSMIN IV KIT
31.0000 | PACK | Freq: Once | INTRAVENOUS | Status: AC | PRN
  Administered 2016-01-24: 31 via INTRAVENOUS
  Filled 2016-01-24: qty 31

## 2016-01-25 ENCOUNTER — Other Ambulatory Visit: Payer: Self-pay | Admitting: *Deleted

## 2016-01-25 DIAGNOSIS — I779 Disorder of arteries and arterioles, unspecified: Secondary | ICD-10-CM

## 2016-01-25 DIAGNOSIS — I739 Peripheral vascular disease, unspecified: Principal | ICD-10-CM

## 2016-01-30 ENCOUNTER — Other Ambulatory Visit: Payer: Self-pay | Admitting: Oncology

## 2016-01-30 DIAGNOSIS — Z853 Personal history of malignant neoplasm of breast: Secondary | ICD-10-CM

## 2016-01-30 DIAGNOSIS — Z9889 Other specified postprocedural states: Secondary | ICD-10-CM

## 2016-02-06 ENCOUNTER — Ambulatory Visit
Admission: RE | Admit: 2016-02-06 | Discharge: 2016-02-06 | Disposition: A | Discharge status: Home / Self Care | Payer: Medicare Other | Source: Ambulatory Visit | Attending: Oncology | Admitting: Oncology

## 2016-02-06 DIAGNOSIS — Z853 Personal history of malignant neoplasm of breast: Secondary | ICD-10-CM

## 2016-02-06 DIAGNOSIS — Z9889 Other specified postprocedural states: Secondary | ICD-10-CM

## 2016-02-06 DIAGNOSIS — R928 Other abnormal and inconclusive findings on diagnostic imaging of breast: Secondary | ICD-10-CM | POA: Diagnosis not present

## 2016-02-14 ENCOUNTER — Ambulatory Visit (INDEPENDENT_AMBULATORY_CARE_PROVIDER_SITE_OTHER): Payer: Medicare Other | Admitting: Cardiovascular Disease

## 2016-02-14 ENCOUNTER — Encounter: Payer: Self-pay | Admitting: Cardiovascular Disease

## 2016-02-14 VITALS — BP 152/72 | HR 58 | Ht 61.0 in | Wt 159.0 lb

## 2016-02-14 DIAGNOSIS — I779 Disorder of arteries and arterioles, unspecified: Secondary | ICD-10-CM | POA: Diagnosis not present

## 2016-02-14 DIAGNOSIS — Z79899 Other long term (current) drug therapy: Secondary | ICD-10-CM | POA: Diagnosis not present

## 2016-02-14 DIAGNOSIS — E782 Mixed hyperlipidemia: Secondary | ICD-10-CM

## 2016-02-14 DIAGNOSIS — I739 Peripheral vascular disease, unspecified: Secondary | ICD-10-CM

## 2016-02-14 DIAGNOSIS — I2510 Atherosclerotic heart disease of native coronary artery without angina pectoris: Secondary | ICD-10-CM | POA: Diagnosis not present

## 2016-02-14 NOTE — Assessment & Plan Note (Signed)
History of remote intervention performed by Dr. Lia Foyer. She recently was complaining of some chest pain and nausea which improved the addition of antireflux medications. The Myoview stress test performed 01/24/16 was entirely normal.

## 2016-02-14 NOTE — Assessment & Plan Note (Signed)
History of hyperlipidemia no longer on a statin drug. We will recheck a lipid and liver profile

## 2016-02-14 NOTE — Patient Instructions (Signed)
Medication Instructions:  Your physician recommends that you continue on your current medications as directed. Please refer to the Current Medication list given to you today.   Labwork: Your physician recommends that you return for lab work at your [facility] will need to be fasting. The lab can be found on the FIRST FLOOR of out building in [address]   Testing/Procedures: Your physician has requested that you have a carotid duplex. This test is an ultrasound of the carotid arteries in your neck. It looks at blood flow through these arteries that supply the brain with blood. Allow one hour for this exam. There are no restrictions or special instructions.  IN 12 MONTHS PRIOR TO 12 MONTHS APPT.   Follow-Up: Your physician wants you to follow-up in: 12 MONTHS WITH DR Gwenlyn Found SOMETIME AFTER YOU HAVE HAD YOUR CAROTID DOPPLERS DONE. You will receive a reminder letter in the mail two months in advance. If you don't receive a letter, please call our office to schedule the follow-up appointment.  If you need a refill on your cardiac medications before your next appointment, please call your pharmacy.

## 2016-02-14 NOTE — Assessment & Plan Note (Signed)
History of bilateral internal carotid artery stenosis by with duplex ultrasound recently performed 01/24/16 revealing moderate right and moderate to severe left ICA stenosis. As for presents progression of disease on both sides. We will follow these up in one year.

## 2016-02-14 NOTE — Assessment & Plan Note (Signed)
History of hypertension with blood pressure measured 156/72. She is on metoprolol and losartan. Continue current meds at current dose

## 2016-02-14 NOTE — Progress Notes (Signed)
Brittany Kirk was seen by me approximately 3 weeks ago. She had a Myoview stress test performed 01/24/16 which was entirely normal. Her pain improved with the addition of anti-reflux medication. In addition, she had carotid Doppler studies that show progression of the disease in both internal carotid arteries which we'll continue to follow noninvasively on annual basis.

## 2016-02-20 DIAGNOSIS — E782 Mixed hyperlipidemia: Secondary | ICD-10-CM | POA: Diagnosis not present

## 2016-02-20 DIAGNOSIS — Z79899 Other long term (current) drug therapy: Secondary | ICD-10-CM | POA: Diagnosis not present

## 2016-02-21 LAB — HEPATIC FUNCTION PANEL
ALT: 8 U/L (ref 6–29)
AST: 14 U/L (ref 10–35)
Albumin: 3.5 g/dL — ABNORMAL LOW (ref 3.6–5.1)
Alkaline Phosphatase: 75 U/L (ref 33–130)
Bilirubin, Direct: 0.1 mg/dL (ref <=0.2)
Indirect Bilirubin: 0.3 mg/dL (ref 0.2–1.2)
Total Bilirubin: 0.4 mg/dL (ref 0.2–1.2)
Total Protein: 5.9 g/dL — ABNORMAL LOW (ref 6.1–8.1)

## 2016-02-21 LAB — LIPID PANEL
Cholesterol: 181 mg/dL (ref 125–200)
HDL: 52 mg/dL (ref >=46)
LDL Cholesterol: 107 mg/dL (ref <130)
Total CHOL/HDL Ratio: 3.5 Ratio (ref <=5.0)
Triglycerides: 112 mg/dL (ref <150)
VLDL: 22 mg/dL (ref <30)

## 2016-02-22 ENCOUNTER — Telehealth: Payer: Self-pay | Admitting: *Deleted

## 2016-02-22 MED ORDER — ATORVASTATIN CALCIUM 40 MG PO TABS: 40.0000 mg | ORAL_TABLET | Freq: Every day | ORAL | 3 refills | Status: DC

## 2016-02-22 NOTE — Telephone Encounter (Signed)
Spoke to patient. Result given . Verbalized understanding Patient states she will try the atorvastatin and have labs recheck in 3 months at Dr Samella Parr OFFICE  ORDER MEDICATION #90 [place]

## 2016-02-22 NOTE — Telephone Encounter (Signed)
-----   Message from Lorretta Harp, MD sent at 02/21/2016  5:36 AM EDT ----- Labs OK for angio except elevated INR. Will need stat INR early morning of procedure

## 2016-02-23 ENCOUNTER — Other Ambulatory Visit: Payer: Self-pay | Admitting: Family Medicine

## 2016-02-23 ENCOUNTER — Other Ambulatory Visit (HOSPITAL_BASED_OUTPATIENT_CLINIC_OR_DEPARTMENT_OTHER): Payer: Medicare Other

## 2016-02-23 ENCOUNTER — Ambulatory Visit (HOSPITAL_BASED_OUTPATIENT_CLINIC_OR_DEPARTMENT_OTHER): Payer: Medicare Other | Admitting: Oncology

## 2016-02-23 ENCOUNTER — Other Ambulatory Visit: Payer: Self-pay | Admitting: *Deleted

## 2016-02-23 VITALS — BP 125/57 | HR 59 | Temp 97.8°F | Resp 18 | Ht 61.0 in | Wt 161.8 lb

## 2016-02-23 DIAGNOSIS — I779 Disorder of arteries and arterioles, unspecified: Secondary | ICD-10-CM

## 2016-02-23 DIAGNOSIS — N183 Chronic kidney disease, stage 3 unspecified: Secondary | ICD-10-CM

## 2016-02-23 DIAGNOSIS — C50411 Malignant neoplasm of upper-outer quadrant of right female breast: Secondary | ICD-10-CM | POA: Diagnosis not present

## 2016-02-23 DIAGNOSIS — I10 Essential (primary) hypertension: Secondary | ICD-10-CM

## 2016-02-23 DIAGNOSIS — Z79811 Long term (current) use of aromatase inhibitors: Secondary | ICD-10-CM

## 2016-02-23 DIAGNOSIS — M810 Age-related osteoporosis without current pathological fracture: Secondary | ICD-10-CM

## 2016-02-23 DIAGNOSIS — I2510 Atherosclerotic heart disease of native coronary artery without angina pectoris: Secondary | ICD-10-CM

## 2016-02-23 DIAGNOSIS — E782 Mixed hyperlipidemia: Secondary | ICD-10-CM

## 2016-02-23 DIAGNOSIS — I739 Peripheral vascular disease, unspecified: Secondary | ICD-10-CM

## 2016-02-23 DIAGNOSIS — Z170 Estrogen receptor positive status [ER+]: Secondary | ICD-10-CM

## 2016-02-23 LAB — COMPREHENSIVE METABOLIC PANEL
ALT: 10 U/L (ref 0–55)
AST: 14 U/L (ref 5–34)
Albumin: 3.1 g/dL — ABNORMAL LOW (ref 3.5–5.0)
Alkaline Phosphatase: 88 U/L (ref 40–150)
Anion Gap: 7 mEq/L (ref 3–11)
BUN: 21.6 mg/dL (ref 7.0–26.0)
CO2: 25 mEq/L (ref 22–29)
Calcium: 8.9 mg/dL (ref 8.4–10.4)
Chloride: 108 mEq/L (ref 98–109)
Creatinine: 1.1 mg/dL (ref 0.6–1.1)
EGFR: 51 mL/min/{1.73_m2} — ABNORMAL LOW (ref >90)
Glucose: 114 mg/dl (ref 70–140)
Potassium: 4.6 mEq/L (ref 3.5–5.1)
Sodium: 140 mEq/L (ref 136–145)
Total Bilirubin: 0.32 mg/dL (ref 0.20–1.20)
Total Protein: 6.3 g/dL — ABNORMAL LOW (ref 6.4–8.3)

## 2016-02-23 LAB — CBC WITH DIFFERENTIAL/PLATELET
BASO%: 0.4 % (ref 0.0–2.0)
Basophils Absolute: 0 10*3/uL (ref 0.0–0.1)
EOS%: 2.3 % (ref 0.0–7.0)
Eosinophils Absolute: 0.2 10*3/uL (ref 0.0–0.5)
HCT: 36.6 % (ref 34.8–46.6)
HGB: 11.9 g/dL (ref 11.6–15.9)
LYMPH%: 38.2 % (ref 14.0–49.7)
MCH: 31 pg (ref 25.1–34.0)
MCHC: 32.5 g/dL (ref 31.5–36.0)
MCV: 95.3 fL (ref 79.5–101.0)
MONO#: 0.5 10*3/uL (ref 0.1–0.9)
MONO%: 5.7 % (ref 0.0–14.0)
NEUT#: 4.2 10*3/uL (ref 1.5–6.5)
NEUT%: 53.4 % (ref 38.4–76.8)
Platelets: 209 10*3/uL (ref 145–400)
RBC: 3.84 10*6/uL (ref 3.70–5.45)
RDW: 15 % — ABNORMAL HIGH (ref 11.2–14.5)
WBC: 7.9 10*3/uL (ref 3.9–10.3)
lymph#: 3 10*3/uL (ref 0.9–3.3)

## 2016-02-23 MED ORDER — ANASTROZOLE 1 MG PO TABS: 1.0000 mg | ORAL_TABLET | Freq: Every day | ORAL | 4 refills | Status: DC

## 2016-02-23 NOTE — Progress Notes (Signed)
[place]  [phone] [phone]     ID: Brittany Kirk DOB: [birth year]  MR#: [id number]  YTK#:[id number]  Patient Care Team: Susy Frizzle, MD as PCP - General (Family Medicine) Elsie Stain, MD (Pulmonary Disease) Chauncey Cruel, MD as Consulting Physician (Oncology) Thea Silversmith, MD as Consulting Physician (Radiation Oncology) Dian Queen, MD as Consulting Physician (Obstetrics and Gynecology) OTHER MD: Griselda Miner M.D.  CHIEF COMPLAINT: Early stage estrogen receptor positive breast cancer  CURRENT TREATMENT: anastrozole   BREAST CANCER HISTORY: From the earlier intake note:  Brittany Kirk underwent routine screening mammography August of 2015 showing a possible mass in the right breast. On 01/19/2014 at the breast center she underwent right diagnostic mammography and ultrasonography. The breast density was category B. The mass appeared spiculated on spot compression views and an ultrasound it was nearly isoechoic. It was located at the 10:00 position 4 cm from the nipple and measured 7.5 mm by ultrasonography. Ultrasound of the right axilla was negative.  On 01/26/2014 the patient underwent biopsy of the mass in question and this showed ([id number]) and invasive ductal carcinoma, grade 1, estrogen receptor 100% positive, progesterone receptor 60% positive, both with strong staining intensity, with an MIB-1 of 12%, and no HER-2 modification, the signals ratio being 1.38 and the number per cell 3.30.  On 02/19/2014 the patient underwent right lumpectomy. Sentinel lymph node sampling was attempted but failed and accordingly the patient received a full axillary lymph node dissection. The final pathology from this procedure (SZ [id number]. [id number]) showed an invasive ductal carcinoma, 1.4 cm, grade 1, with negative margins, and all 19 lymph nodes clear.  Her subsequent history is as detailed below  INTERVAL HISTORY: Brittany Kirk returns today for  follow-up of her estrogen receptor positive breast cancer. She continues on anastrozole, with good tolerance.Hot flashes and vaginal dryness are not a major issue. She never developed the arthralgias or myalgias that many patients can experience on this medication. She obtains it at a good price.   REVIEW OF SYSTEMS: Brittany Kirk is still having headaches. There are not constant. They tend to occur in the afternoon. They're not accompanied by nausea or vomiting or dizziness. There have been no falls. She had an episode of chest pressure and she was referred to cardiology. She had a stress test and that was read as "low risk". She had an excellent ejection fraction. She is concerned because her ankles continued to swell and they are significantly swollen right now. She is short of breath when walking stairs. She bruises easily. She feels anxious but not depressed. A detailed review of systems today was otherwise stable.  PAST MEDICAL HISTORY: Past Medical History:  Diagnosis Date  . Allergic rhinitis   . Asymptomatic bilateral carotid artery stenosis   . CAD (coronary artery disease)   . Cancer ([place])   . Complication of anesthesia    sore throat after a surgery  . Depression   . Emphysema    no longer uses inhalers  . Gallstones    nausea, pain upper right abdomen  . GERD (gastroesophageal reflux disease)   . H/O hiatal hernia   . History of skin cancer   . Hyperlipidemia   . Hypertension   . Shortness of breath    sob if walks up flight of stairs  . Tobacco user   . Wears dentures    top    PAST SURGICAL HISTORY: Past Surgical History:  Procedure Laterality Date  . CHOLECYSTECTOMY  07/08/2012   Procedure: LAPAROSCOPIC CHOLECYSTECTOMY WITH INTRAOPERATIVE CHOLANGIOGRAM;  Surgeon: Gayland Curry, MD,FACS;  Location: [facility];  Service: General;  Laterality: N/A;  Laparoscopic Cholecystectomy with Intraoperative Cholangiogram  . COLONOSCOPY    . TONSILECTOMY, ADENOIDECTOMY, BILATERAL  MYRINGOTOMY AND TUBES    . TUBAL LIGATION      FAMILY HISTORY Family History  Problem Relation Age of Onset  . Arthritis Mother   . Heart disease      5 brothers and 2 sister  . Diverticulitis Sister    the patient's father died at the age of 65. The patient's mother died at the age of 69 from pancreatic cancer which had been diagnosed a year earlier. The patient had 6 brothers, 2 sisters. There is no history of breast or ovarian cancer in the family.  GYNECOLOGIC HISTORY:  No LMP recorded. Patient is postmenopausal. Menarche age 17, first live birth age 71. The patient is GX P1. She went through menopause at approximately age 33. She did not take hormone replacement.   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 She has a son from her first marriage, Brittany Kirk, who works in [place] as a Dealer. The patient has 2 biological grandchildren, including a 39 year old grandson who is undergoing bone marrow transplant at [facility] for aplastic anemia 03/25/2014. Grants's current, second husband, Brittany Kirk"), worked in Arboriculturist. He is now retired. He also has 2 grandchildren from an earlier marriage. He is a Airline pilot. The patient attends a local [employer].    ADVANCED DIRECTIVES: Not in place   HEALTH MAINTENANCE: Social History  Substance Use Topics  . Smoking status: Former Smoker    Packs/day: 0.50    Years: 40.00    Types: Cigarettes    Quit date: 02/12/2013  . Smokeless tobacco: Never Used  . Alcohol use No     Colonoscopy:  PAP:  Bone density: At Dr. Christen Butter; osteopenia  Lipid panel:  Allergies  Allergen Reactions  . Latex   . Cefaclor Itching, Swelling and Rash  . Metronidazole Itching, Swelling and Rash  . Naproxen Itching, Swelling and Rash  . Nsaids Itching, Swelling and Rash    Can take advil  . Penicillins Itching, Swelling and Rash  . Septra [Bactrim]  Itching, Swelling and Rash  . Sulfonamide Derivatives Itching, Swelling and Rash    Current Outpatient Prescriptions  Medication Sig Dispense Refill  . anastrozole (ARIMIDEX) 1 MG tablet Take 1 tablet (1 mg total) by mouth daily. 90 tablet 4  . aspirin 81 MG tablet Take 81 mg by mouth daily.    Marland Kitchen atorvastatin (LIPITOR) 40 MG tablet Take 1 tablet (40 mg total) by mouth daily. 90 tablet 3  . Cholecalciferol (VITAMIN D3) 5000 UNITS CAPS Take 5,000 Units by mouth daily.    . cyanocobalamin 2000 MCG tablet Take 2,000 mcg by mouth daily. B12 slow release tablet daily    . diazepam (VALIUM) 5 MG tablet TAKE 1 TABLET BY MOUTH AT BEDTIME AS NEEDED FOR ANXIETY  3  . escitalopram (LEXAPRO) 20 MG tablet Take 20 mg by mouth daily.    Marland Kitchen esomeprazole (NEXIUM) 40 MG capsule Take 40 mg by mouth daily at 12 noon.    Marland Kitchen HYDROcodone-acetaminophen (NORCO) 5-325 MG tablet Take 1 tablet by mouth every 6 (six) hours as needed for moderate pain. 30 tablet 0  . losartan (COZAAR) 50 MG tablet TAKE 1 TABLET EVERY DAY  90 tablet 3  . metoprolol succinate (TOPROL-XL) 25 MG 24 hr tablet Take 12.5 mg by mouth daily.    . Multiple Vitamin (MULTIVITAMIN) tablet Take 1 tablet by mouth daily.     No current facility-administered medications for this visit.     OBJECTIVE: Middle-aged white womanWho appears stated age 15:   02/23/16 1516  BP: (!) 125/57  Pulse: (!) 59  Resp: 18  Temp: 97.8 F (36.6 C)     Body mass index is 30.57 kg/m.    ECOG FS:1 - Symptomatic but completely ambulatory  Sclerae unicteric, EOMs intact Oropharynx clear and moist No cervical or supraclavicular adenopathy Lungs no rales or rhonchi Heart regular rate and rhythm Abd soft, nontender, positive bowel sounds MSK no focal spinal tenderness,  bilateral ankle swelling, grade 1 Neuro: nonfocal, well oriented, appropriate affect Breasts: The right breast is status post lumpectomy, with no evidence of disease recurrence or activity. The  right axilla is benign. Left breast is unremarkable. He will as good as he has 1 last as I Hope is just a fever doing a new compression stockings usually right 40. At least because of [place] and the way to do this is obviously at night is when you her legs have been up all day) and 10 hours as your ankles are thinner with him on keep them on hold as medical loss [place]   LAB RESULTS:  CMP     Component Value Date/Time   NA 139 02/15/2015 1355   K 4.3 02/15/2015 1355   CL 104 10/06/2014 1614   CO2 24 02/15/2015 1355   GLUCOSE 105 02/15/2015 1355   BUN 21.4 02/15/2015 1355   CREATININE 1.1 02/15/2015 1355   CALCIUM 8.7 02/15/2015 1355   PROT 5.9 (L) 02/20/2016 0955   PROT 6.4 02/15/2015 1355   ALBUMIN 3.5 (L) 02/20/2016 0955   ALBUMIN 3.4 (L) 02/15/2015 1355   AST 14 02/20/2016 0955   AST 14 02/15/2015 1355   ALT 8 02/20/2016 0955   ALT 9 02/15/2015 1355   ALKPHOS 75 02/20/2016 0955   ALKPHOS 82 02/15/2015 1355   BILITOT 0.4 02/20/2016 0955   BILITOT 0.30 02/15/2015 1355   GFRNONAA 57 (L) 02/18/2014 1130   GFRAA 66 (L) 02/18/2014 1130    I No results found for: SPEP  Lab Results  Component Value Date   WBC 7.9 02/23/2016   NEUTROABS 4.2 02/23/2016   HGB 11.9 02/23/2016   HCT 36.6 02/23/2016   MCV 95.3 02/23/2016   PLT 209 02/23/2016      Chemistry      Component Value Date/Time   NA 139 02/15/2015 1355   K 4.3 02/15/2015 1355   CL 104 10/06/2014 1614   CO2 24 02/15/2015 1355   BUN 21.4 02/15/2015 1355   CREATININE 1.1 02/15/2015 1355      Component Value Date/Time   CALCIUM 8.7 02/15/2015 1355   ALKPHOS 75 02/20/2016 0955   ALKPHOS 82 02/15/2015 1355   AST 14 02/20/2016 0955   AST 14 02/15/2015 1355   ALT 8 02/20/2016 0955   ALT 9 02/15/2015 1355   BILITOT 0.4 02/20/2016 0955   BILITOT 0.30 02/15/2015 1355       No results found for: LABCA2  No components found for: LABCA125  No results for input(s): INR in the last 168  hours.  Urinalysis    Component Value Date/Time   COLORURINE YELLOW 10/06/2014 1614    STUDIES: Mm Diag Breast Tomo Bilateral  Result Date: 02/06/2016 CLINICAL DATA:  Right breast cancer diagnosed in 2015. The patient is status post right breast lumpectomy. She presents for annual exam and is asymptomatic. EXAM: 2D DIGITAL DIAGNOSTIC BILATERAL MAMMOGRAM WITH CAD AND ADJUNCT TOMO COMPARISON:  Previous exam(s). ACR Breast Density Category b: There are scattered areas of fibroglandular density. FINDINGS: There are lumpectomy changes in the outer right breast, less apparent on today's examination compared to the prior in 2016. No suspicious mass, nonsurgical distortion, or suspicious microcalcification is identified in either breast to suggest malignancy. Mammographic images were processed with CAD. IMPRESSION: No evidence of malignancy in either breast. RECOMMENDATION: Diagnostic mammogram is suggested in 1 year. (Code:[id number]-01Y) I have discussed the findings and recommendations with the patient. Results were also provided in writing at the conclusion of the visit. If applicable, a reminder letter will be sent to the patient regarding the next appointment. BI-RADS CATEGORY  2: Benign. Electronically Signed   By: Curlene Dolphin M.D.   On: 02/06/2016 14:27   Stress test 01/24/2016:  Nuclear stress EF: 70%. No wall motion abnormalities  The left ventricular ejection fraction is hyperdynamic (>65%).  There was no ST segment deviation noted during stress.  This is a low risk study. No perfusion defects, no ischemia identified.   Candee Furbish, MD  ASSESSMENT: 70 y.o. [place] woman status post right upper outer quadrant lumpectomy and axillary lymph node dissection 02/19/2014 for a pT1c pN0, stage IA invasive ductal carcinoma, grade 1, estrogen and progesterone receptor positive, HER-2 not amplified, with an MIB-1 of 12%  (1) Oncotype score of 18 predicts a risk of outside the breast recurrence  of 11% if the patient's only systemic treatment is tamoxifen for 5 years. It also suggests no benefit from chemotherapy  (2) the patient opted against adjuvant radiation  (3) anastrozole started 04/18/2014  (a) DEX[facility] scan [facility] August 20 03/01/2015 showed a T score of -3.2 (osteoporosis)  PLAN: I spent approximately 30 minutes with Estill Bamberg today going over her multiple problems of which breast cancer is the least active 1.  As far as that goes, there that is very favorable.  She is tolerating the anastrozole well. The plan will be to continue that for a total of 5 years.  We have discussed bone density issues. At this point she is doing vitamin D but not having a regular walking exercise program. She is not "ready" to go on bisphosphonates or other pharmacological agents for this purpose.  The 2 things that are bothering her the most of her headaches and her swollen ankles. We previously evaluated her headaches with a brain MRI, which was negative. The headaches have not changed in intensity or frequency. I think the reason she has these headaches is her severe kyphosis, and if that is the case and possibly physical therapy could help the problem. Perhaps she could even start wearing a neck brace. An rate I placed a physical therapy referral for further evaluation and treatment.  As far as the ankles are concerned, she has an excellent ejection fraction per Dr. Kennon Holter evaluation and none of her medications seem to me likely to be causing this problem. Most likely she has venous insufficiency. We went over the pathophysiology of that and I suggested she get compression stockings. I went ahead and wrote her a prescription. If that does not work for her or if it is very uncomfortable she will discuss it further with her primary care physician in case he would like to  try diuretics otherwise Rhona will return to see me in one year. She knows to call for any problems that may develop  before that visit.     Chauncey Cruel, MD   02/23/2016 3:24 PM

## 2016-02-27 NOTE — Telephone Encounter (Signed)
SPOKE TO DR PICKARD'S NURSE  SANDY SATES SHE WILL PLACE LAB ORDER FOR PATIENT TO DO IN 3 MONTHS AS ORDERED.  RN INFORMED SANDY, PATIENT S AWARE OF RESULTS AND WILL START MEDICATIONS.

## 2016-02-29 ENCOUNTER — Ambulatory Visit: Payer: Medicare Other | Attending: Oncology | Admitting: Physical Therapy

## 2016-02-29 DIAGNOSIS — G44219 Episodic tension-type headache, not intractable: Secondary | ICD-10-CM | POA: Insufficient documentation

## 2016-02-29 DIAGNOSIS — R293 Abnormal posture: Secondary | ICD-10-CM | POA: Diagnosis not present

## 2016-02-29 NOTE — Patient Instructions (Addendum)
Lie on your back with one pillow under your head.  You may have your knees straight or bent, whatever is comfortable. Feel the flat part of the back of your head against the pillow (looking straight up at ceiling); tuck your chin back to press that part of your head against the pillow.  Use whatever pressure you can tolerated, and you should feel like this is working your neck muscles.  Hold for a count of five. Do 7-15 repetitions.  Do once or twice a day.  Do neck stabilization 1 & 2 series in supine with head against pillow, 5 count holds, 5 reps each.

## 2016-02-29 NOTE — Therapy (Signed)
[place], [place], [address] Phone: [phone]   Fax:  [phone]  Physical Therapy Evaluation  Patient Details  Name: Brittany Kirk MRN: [id number] Date of Birth: Mar 14, 1946 Referring Provider: Dr. Lurline Del  Encounter Date: 02/29/2016      PT End of Session - 02/29/16 2239    Visit Number 1   Number of Visits 1   PT Start Time 1300   PT Stop Time 1350   PT Time Calculation (min) 50 min   Activity Tolerance Patient tolerated treatment well   Behavior During Therapy [facility] for tasks assessed/performed      Past Medical History:  Diagnosis Date  . Allergic rhinitis   . Asymptomatic bilateral carotid artery stenosis   . CAD (coronary artery disease)   . Cancer ([place])   . Complication of anesthesia    sore throat after a surgery  . Depression   . Emphysema    no longer uses inhalers  . Gallstones    nausea, pain upper right abdomen  . GERD (gastroesophageal reflux disease)   . H/O hiatal hernia   . History of skin cancer   . Hyperlipidemia   . Hypertension   . Shortness of breath    sob if walks up flight of stairs  . Tobacco user   . Wears dentures    top    Past Surgical History:  Procedure Laterality Date  . CHOLECYSTECTOMY  07/08/2012   Procedure: LAPAROSCOPIC CHOLECYSTECTOMY WITH INTRAOPERATIVE CHOLANGIOGRAM;  Surgeon: Gayland Curry, MD,FACS;  Location: [facility];  Service: General;  Laterality: N/A;  Laparoscopic Cholecystectomy with Intraoperative Cholangiogram  . COLONOSCOPY    . TONSILECTOMY, ADENOIDECTOMY, BILATERAL MYRINGOTOMY AND TUBES    . TUBAL LIGATION      There were no vitals filed for this visit.       Subjective Assessment - 02/29/16 1309    Subjective Was having headaches so Dr. Jana Hakim sent her here, but got a neck massager and has been sitting up straight, and that seems to have resolved the headaches.  Still wants to learn exercise or advice.   Lymphedema is doind well.  Not wearing sleeve today, but "when I wear my sleeve, it's just perfect."  Did her manual lymph drainage this morning.  Is having leg swelling and plans to get compression stockings.  Says she is a side sleeper.  Says she does standing supine scapular series with Theraband about every other day.   Pertinent History Headaches; was told her kyphosis might be causing these, and they have improved since improving posture and using a neck massager.  In September 2015 (02/19/14), had right lumpectomy and ALND (19 nodes).  Right arm swelling started pretty soon after surgery; she has been seen here before and has done manual lymph drainage but has not had a compression sleeve.  No other treatment except now on Arimidex.  HTN controlled with meds.     Patient Stated Goals get info about exercise to avoid headaches   Currently in Pain? No/denies            [facility] PT Assessment - 02/29/16 0001      Assessment   Medical Diagnosis headaches   Referring Provider Dr. Sarajane Jews Magrinat     Precautions   Precautions Other (comment)   Precaution Comments cancer precautions; right UE lymphedema     Restrictions   Weight Bearing Restrictions No     Balance Screen   Has the patient  fallen in the past 6 months No   Has the patient had a decrease in activity level because of a fear of falling?  No   Is the patient reluctant to leave their home because of a fear of falling?  No     Prior Function   Leisure rides stationary bike about 10 minutes a day; walks around the house some     Cognition   Overall Cognitive Status Within Functional Limits for tasks assessed     Posture/Postural Control   Posture/Postural Control Postural limitations   Postural Limitations Forward head;Increased thoracic kyphosis   Posture Comments but patient is now working on holding head more erect     ROM / Strength   AROM / PROM / Strength AROM     AROM   Overall AROM Comments neck AROM WFL all  directions     Palpation   Palpation comment significant tightness at both upper traps with some tender point areas                   [facility] Adult PT Treatment/Exercise - 02/29/16 0001      Neck Exercises: Supine   Neck Retraction --  head on pillow, 7 reps   Other Supine Exercise Instructed in and patient performed cervical stabilization exercises 1 & 2 involving isometric neck retraction on pillow with eye movement, arm movement, arm isometrics, opposite arm and leg movements.                PT Education - 02/29/16 2239    Education provided Yes   Education Details chin retraction/neck extension isometrics in supine with head on pillow; cervical stabilization 1 & 2 exercises   Person(s) Educated Patient   Methods Explanation;Verbal cues;Handout   Comprehension Verbalized understanding;Returned demonstration                [facility] Goals - 02/29/16 2250      CC Long Term Goal  #1   Title Patient will be knowledgeable about home exercise program for posture correction and cervical stabilization.   Status Achieved            Plan - 02/29/16 2240    Clinical Impression Statement Patient came in today having been referred for headaches, which Dr. Jana Hakim thought may be from her forward head, kyphotic posture.  Between the time she reported these to the doctor and her appointment today, she had begun self-treatment with improving her posture and using a neck massage machine.  She reports her headaches have gotten much better but that she is still interested in learning exercise to do.  Her neck AROM is fairly good.  She does have significant muscle tightness at both upper traps, along with tender points, and we discussed her getting massages for this.  She has learned scapular and upper back muscle strengthening from [place] in the past and does these exercises.  Today we concentrated on neck extensor  muscle isometric strengthening and cervical  stabilization exercises, which she was receptive to and was able to perform adequately.  She has been treated here in the past for right UE lymphedema, and she is pleased with how her arm is doing currently in regard to that condition.   Rehab Potential Good   PT Frequency One time visit   PT Treatment/Interventions ADLs/Self Care Home Management;Patient/family education   PT Next Visit Plan No further therapy planned at this time.  Patient wants to do the exercises on her own  and has already noted a decrease in her headaches.  She will call if problems resurface.     PT Home Exercise Plan cervical stabilization, upper back strengthening, posture correction   Consulted and Agree with Plan of Care Patient      Patient will benefit from skilled therapeutic intervention in order to improve the following deficits and impairments:  Pain, Postural dysfunction  Visit Diagnosis: Episodic tension-type headache, not intractable - Plan: PT plan of care cert/re-cert  Abnormal posture - Plan: PT plan of care cert/re-cert      G-Codes - 27/78/24 Aug 16, 2249    Functional Assessment Tool Used clinical judgement   Functional Limitation Changing and maintaining body position   Changing and Maintaining Body Position Current Status (M3536) At least 1 percent but less than 20 percent impaired, limited or restricted   Changing and Maintaining Body Position Goal Status (R4431) At least 1 percent but less than 20 percent impaired, limited or restricted   Changing and Maintaining Body Position Discharge Status (V4008) At least 1 percent but less than 20 percent impaired, limited or restricted       Problem List Patient Active Problem List   Diagnosis Date Noted  . Bilateral carotid artery disease ([place]) 02/14/2016  . Chest pain 01/13/2016  . Osteoporosis 02/22/2015  . CKD (chronic kidney disease), stage III 08/16/2014  . Breast cancer of upper-outer quadrant of right female breast ([place]) 03/22/2014  . Dizziness  and giddiness 11/27/2012  . Essential tremor 11/27/2012  . Numbness 11/27/2012  . TOBACCO USER 10/04/2009  . CLOSED FRACTURE OF ONE RIB 07/12/2009  . HYPERLIPIDEMIA TYPE IIB / III 09/28/2008  . CAD, NATIVE VESSEL 09/28/2008  . ALLERGIC RHINITIS 09/09/2008  . EMPHYSEMA 09/07/2008  . Essential hypertension 09/06/2008  . SKIN CANCER, HX OF 09/06/2008    Aime Meloche 02/29/2016, 10:55 PM  [place], [place], [address] Phone: [phone]   Fax:  [phone]  Name: Brittany Kirk MRN: [id number] Date of Birth: [birth year]  PHYSICAL THERAPY DISCHARGE SUMMARY  Visits from Start of Care: 1  Current functional level related to goals / functional outcomes: Goal met as noted above.   Remaining deficits: None at this time.   Education / Equipment: Home exercise, recommended getting massages. Plan: Patient agrees to discharge.  Patient goals were met. Patient is being discharged due to meeting the stated rehab goals.  ?????     Serafina Royals, PT 02/29/16 10:56 PM

## 2016-03-02 ENCOUNTER — Ambulatory Visit: Payer: Medicare Other | Admitting: Physical Therapy

## 2016-03-07 IMAGING — MG MM DIGITAL DIAGNOSTIC UNILAT*R*
2 series · 2 of 2 positions shown · non-contrast
Comparison: Prior exams

CLINICAL DATA: Call back from screening for a possible right breast
mass.

EXAM:
DIGITAL DIAGNOSTIC  RIGHT MAMMOGRAM
ULTRASOUND RIGHT BREAST

[R CC]
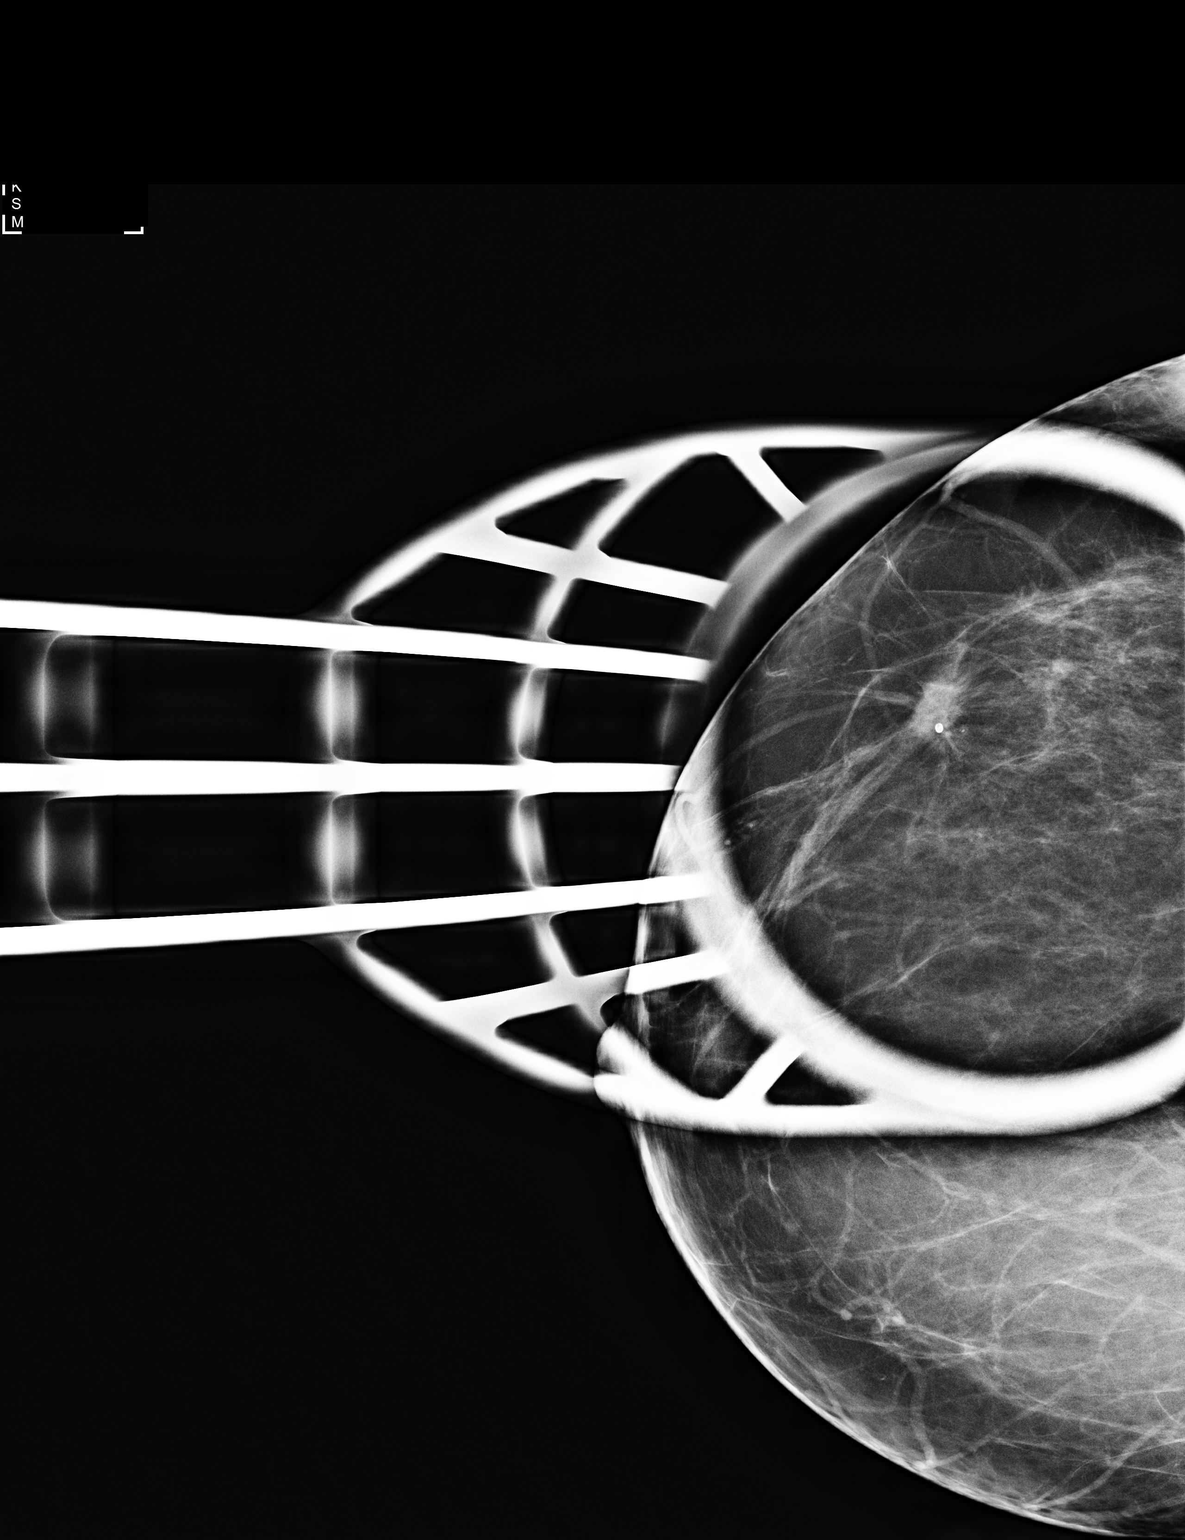

[R MLO]
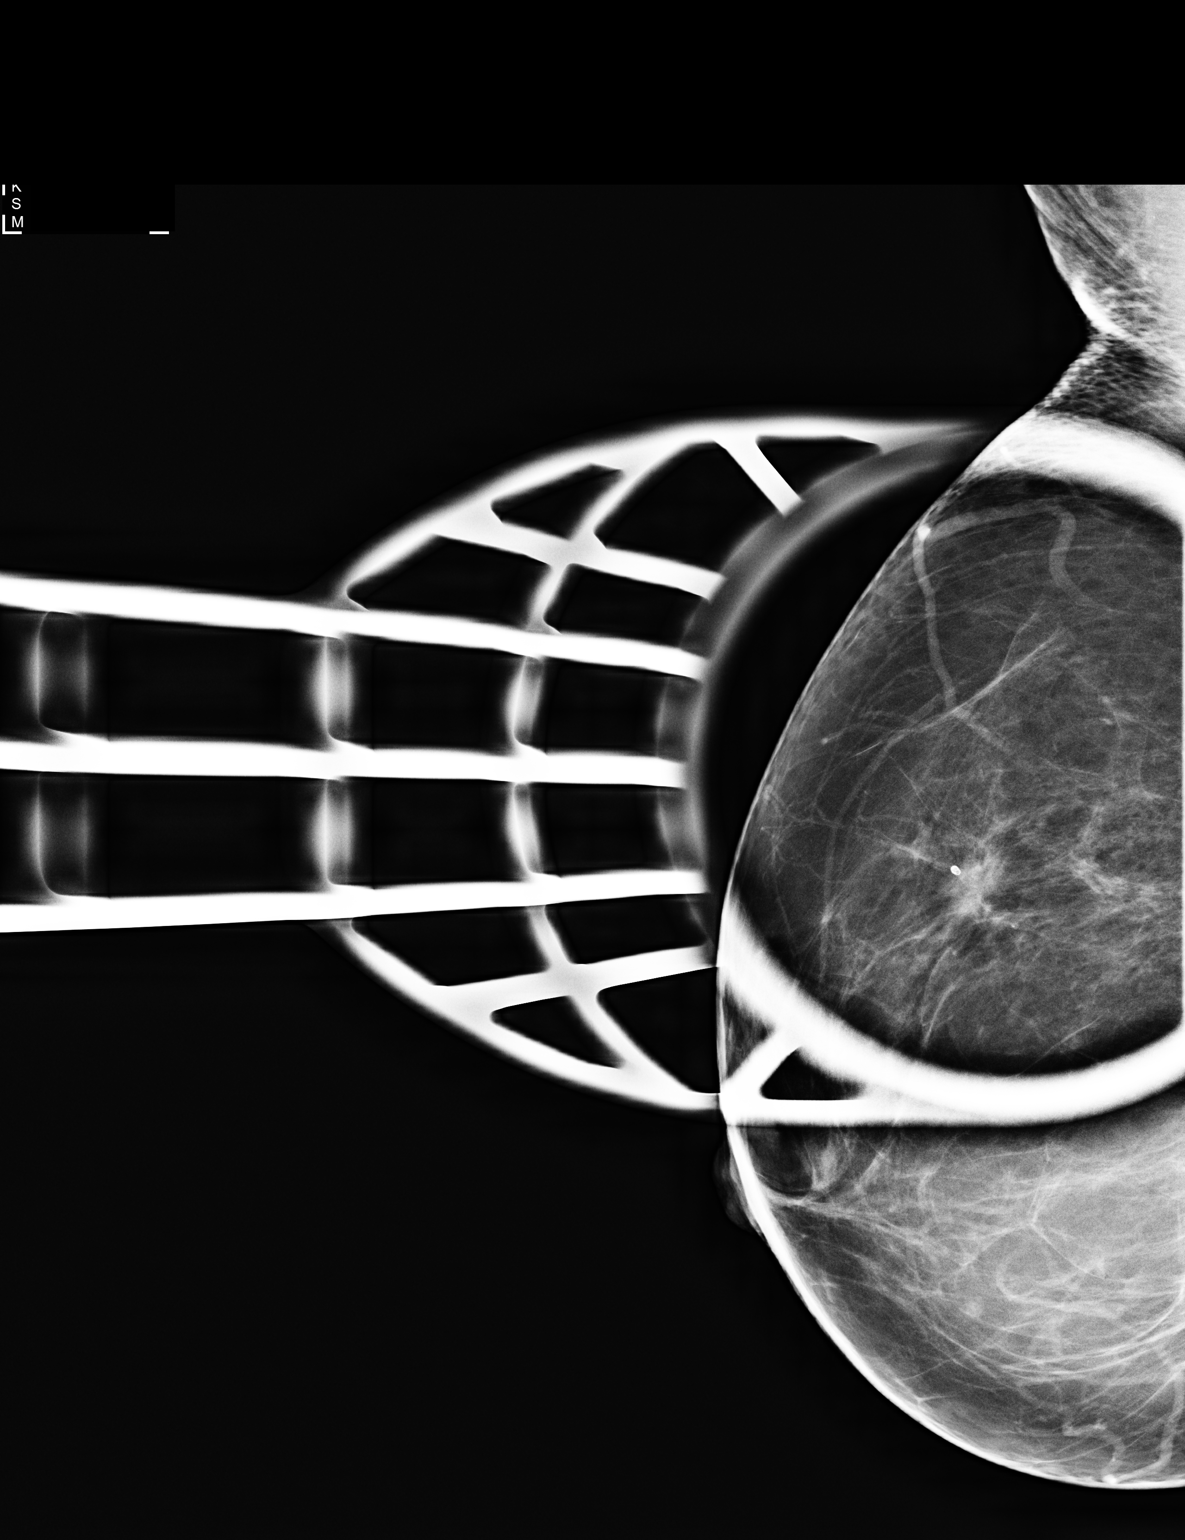

[2 of 2 positions shown; findings below may reference images not displayed]

ACR Breast Density Category b: There are scattered areas of
fibroglandular density.
FINDINGS: On spot compression imaging, the possible mass persists, and appears
more suspicious with spiculated margins.

Ultrasound is performed, showing a nearly isoechoic, mildly
heterogeneous mass with mildly irregular and partly indistinct
margins. This lies in the 10 o'clock position, 4 cm from the nipple,
which correlates with the mammographic abnormality. It measures
mm by 6 mm x 3.9 mm.

Ultrasound interrogation of the right axilla shows no enlarged or
abnormal appearing lymph nodes or masses.
IMPRESSION: Suspicious right breast mass.  Biopsy is recommended.

RECOMMENDATION:
Patient will be scheduled for ultrasound-guided core needle biopsy
of the right breast mass.

I have discussed the findings and recommendations with the patient.
Results were also provided in writing at the conclusion of the
visit. If applicable, a reminder letter will be sent to the patient
regarding the next appointment.

BI-RADS CATEGORY  4: Suspicious.

## 2016-03-08 ENCOUNTER — Ambulatory Visit (INDEPENDENT_AMBULATORY_CARE_PROVIDER_SITE_OTHER): Payer: Medicare Other | Admitting: Family Medicine

## 2016-03-08 ENCOUNTER — Encounter: Payer: Self-pay | Admitting: Family Medicine

## 2016-03-08 VITALS — BP 132/70 | HR 68 | Temp 98.3°F | Resp 16 | Ht 61.0 in | Wt 161.0 lb

## 2016-03-08 DIAGNOSIS — I872 Venous insufficiency (chronic) (peripheral): Secondary | ICD-10-CM | POA: Diagnosis not present

## 2016-03-08 DIAGNOSIS — Z23 Encounter for immunization: Secondary | ICD-10-CM | POA: Diagnosis not present

## 2016-03-08 DIAGNOSIS — I2510 Atherosclerotic heart disease of native coronary artery without angina pectoris: Secondary | ICD-10-CM

## 2016-03-08 MED ORDER — FUROSEMIDE 20 MG PO TABS: 20.0000 mg | ORAL_TABLET | Freq: Every day | ORAL | 3 refills | Status: DC

## 2016-03-08 NOTE — Progress Notes (Signed)
Subjective:    Patient ID: Brittany Kirk, female    DOB: 1945/07/26, 70 y.o.   MRN: [id number]  HPI Patient presents today complaining of swelling in both legs. Recently had a stress test through the cardiologist which revealed an ejection fraction of 70%. In the morning, there is very minimal swelling in her legs. That afternoon, her ankles and feet are extremely swollen to the point that it is painful for her to wear shoes. She discussed the situation with her oncologist. He recommended that she wear compression stockings for chronic venous insufficiency. She is tried whether stockings but she has a difficult time putting them on. She denies any chest pain shortness of breath or dyspnea on exertion. Physical exam today her is significant for trace bipedal edema with numerous small purple varicose veins clustered on her anterior shin, the dorsum of her feet, and around the medial malleolus bilaterally. Each vein is to 4 mm in diameter. There is no ulcers or source. Past Medical History:  Diagnosis Date  . Allergic rhinitis   . Asymptomatic bilateral carotid artery stenosis   . CAD (coronary artery disease)   . Cancer ([place])   . Complication of anesthesia    sore throat after a surgery  . Depression   . Emphysema    no longer uses inhalers  . Gallstones    nausea, pain upper right abdomen  . GERD (gastroesophageal reflux disease)   . H/O hiatal hernia   . History of skin cancer   . Hyperlipidemia   . Hypertension   . Shortness of breath    sob if walks up flight of stairs  . Tobacco user   . Wears dentures    top   Past Surgical History:  Procedure Laterality Date  . CHOLECYSTECTOMY  07/08/2012   Procedure: LAPAROSCOPIC CHOLECYSTECTOMY WITH INTRAOPERATIVE CHOLANGIOGRAM;  Surgeon: Gayland Curry, MD,FACS;  Location: [facility];  Service: General;  Laterality: N/A;  Laparoscopic Cholecystectomy with Intraoperative Cholangiogram  . COLONOSCOPY    . TONSILECTOMY, ADENOIDECTOMY, BILATERAL  MYRINGOTOMY AND TUBES    . TUBAL LIGATION     Current Outpatient Prescriptions on File Prior to Visit  Medication Sig Dispense Refill  . anastrozole (ARIMIDEX) 1 MG tablet Take 1 tablet (1 mg total) by mouth daily. 90 tablet 4  . aspirin 81 MG tablet Take 81 mg by mouth daily.    Marland Kitchen atorvastatin (LIPITOR) 40 MG tablet Take 1 tablet (40 mg total) by mouth daily. 90 tablet 3  . Cholecalciferol (VITAMIN D3) 5000 UNITS CAPS Take 5,000 Units by mouth daily.    . cyanocobalamin 2000 MCG tablet Take 2,000 mcg by mouth daily. B12 slow release tablet daily    . diazepam (VALIUM) 5 MG tablet TAKE 1 TABLET BY MOUTH AT BEDTIME AS NEEDED FOR ANXIETY  3  . escitalopram (LEXAPRO) 20 MG tablet Take 20 mg by mouth daily.    Marland Kitchen esomeprazole (NEXIUM) 40 MG capsule Take 40 mg by mouth daily at 12 noon.    Marland Kitchen glucose-Vitamin C 4-0.006 GM CHEW chewable tablet Chew 1 tablet by mouth.    Marland Kitchen HYDROcodone-acetaminophen (NORCO) 5-325 MG tablet Take 1 tablet by mouth every 6 (six) hours as needed for moderate pain. 30 tablet 0  . losartan (COZAAR) 50 MG tablet TAKE 1 TABLET EVERY DAY 90 tablet 3  . metoprolol succinate (TOPROL-XL) 25 MG 24 hr tablet Take 12.5 mg by mouth daily.    . Multiple Vitamin (MULTIVITAMIN) tablet Take 1 tablet by mouth  daily.    . vitamin E 200 UNIT capsule Take 200 Units by mouth daily.     No current facility-administered medications on file prior to visit.    Allergies  Allergen Reactions  . Latex   . Cefaclor Itching, Swelling and Rash  . Metronidazole Itching, Swelling and Rash  . Naproxen Itching, Swelling and Rash  . Nsaids Itching, Swelling and Rash    Can take advil  . Penicillins Itching, Swelling and Rash  . Septra [Bactrim] Itching, Swelling and Rash  . Sulfonamide Derivatives Itching, Swelling and Rash   Social History   Social History  . Marital status: Married    Spouse name: N/A  . Number of children: 1  . Years of education: N/A   Occupational History  . 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  Retired   Social History Main Topics  . Smoking status: Former Smoker    Packs/day: 0.50    Years: 40.00    Types: Cigarettes    Quit date: 02/12/2013  . Smokeless tobacco: Never Used  . Alcohol use No  . Drug use: No  . Sexual activity: Not on file   Other Topics Concern  . Not on file   Social History Narrative  . No narrative on file      Review of Systems  All other systems reviewed and are negative.      Objective:   Physical Exam  Cardiovascular: Normal rate, regular rhythm and normal heart sounds.   Pulmonary/Chest: Effort normal and breath sounds normal. No respiratory distress. She has no wheezes. She has no rales. She exhibits no tenderness.  Abdominal: Soft. Bowel sounds are normal.  Musculoskeletal: She exhibits edema.  Vitals reviewed.         Assessment & Plan:  Chronic venous insufficiency - Plan: furosemide (LASIX) 20 MG tablet  I agree 100% with her oncologist. The patient is suffering from chronic venous insufficiency. I 2 recommended that she wear compression hose on a daily basis to help treat and control the underlying condition. She can use Lasix 20 mg a day as needed on severe days to help manage severe swelling. However I explained to the patient that this is a diuretic and that essentially we are trying to create dehydration to help manage pitting edema. This is all probably not good for her kidneys and therefore should be used sparingly only on days with severe swelling. She understands but would still like to be able to use the medication on days when it is necessary.

## 2016-03-09 NOTE — Addendum Note (Signed)
Addended by: Shary Decamp B on: 03/09/2016 10:28 AM   Modules accepted: Orders

## 2016-03-14 ENCOUNTER — Ambulatory Visit (INDEPENDENT_AMBULATORY_CARE_PROVIDER_SITE_OTHER): Payer: Medicare Other | Admitting: Physician Assistant

## 2016-03-14 ENCOUNTER — Encounter: Payer: Self-pay | Admitting: Physician Assistant

## 2016-03-14 DIAGNOSIS — J988 Other specified respiratory disorders: Secondary | ICD-10-CM | POA: Diagnosis not present

## 2016-03-14 DIAGNOSIS — I2510 Atherosclerotic heart disease of native coronary artery without angina pectoris: Secondary | ICD-10-CM

## 2016-03-14 DIAGNOSIS — J438 Other emphysema: Secondary | ICD-10-CM | POA: Diagnosis not present

## 2016-03-14 DIAGNOSIS — B9689 Other specified bacterial agents as the cause of diseases classified elsewhere: Secondary | ICD-10-CM

## 2016-03-14 DIAGNOSIS — J441 Chronic obstructive pulmonary disease with (acute) exacerbation: Secondary | ICD-10-CM | POA: Diagnosis not present

## 2016-03-14 IMAGING — US US RT BREAST BX W LOC DEV 1ST LESION IMG BX SPEC US GUIDE
1 series · 13 of 14 positions shown · non-contrast
Comparison: Previous exams
COMPARISON: Previous exams.

ADDENDUM:
The right diagnostic mammogram report should read as follows:
CLINICAL DATA: Status post ultrasound-guided core needle biopsy of
an 8 mm mass in the 10 o'clock position of the right breast.

EXAM:
DIAGNOSTIC RIGHT MAMMOGRAM POST ULTRASOUND BIOPSY
CLINICAL DATA: 8 mm mass suspicious for malignancy in the 10
o'clock position of the right breast at recent mammography and
ultrasound.
ULTRASOUND GUIDED RIGHT BREAST CORE NEEDLE BIOPSY

[Series 1: superficial breast · 13 of 14 slices shown]
[im 1/14]
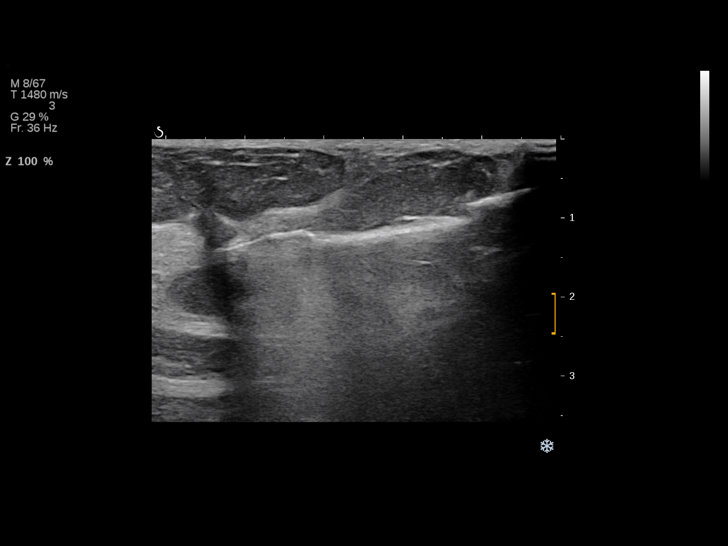
[im 2/14]
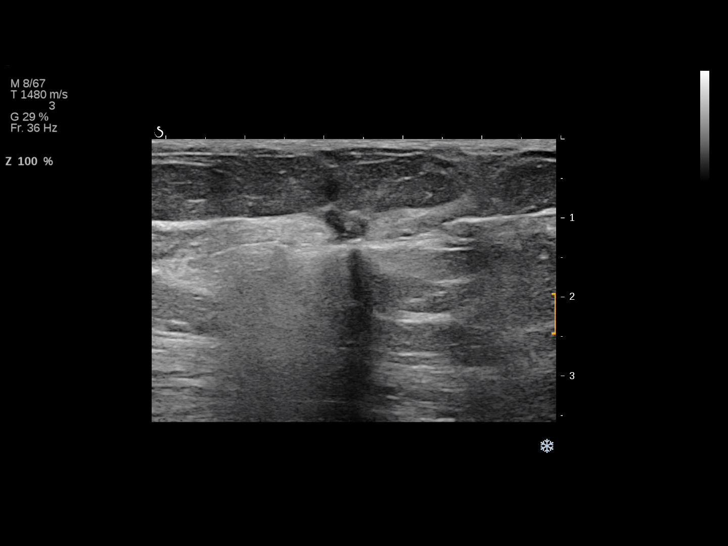
[im 3/14]
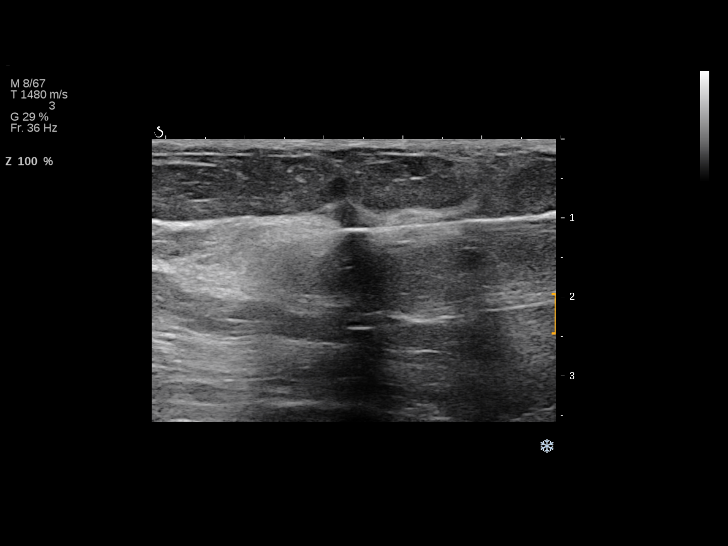
[im 4/14]
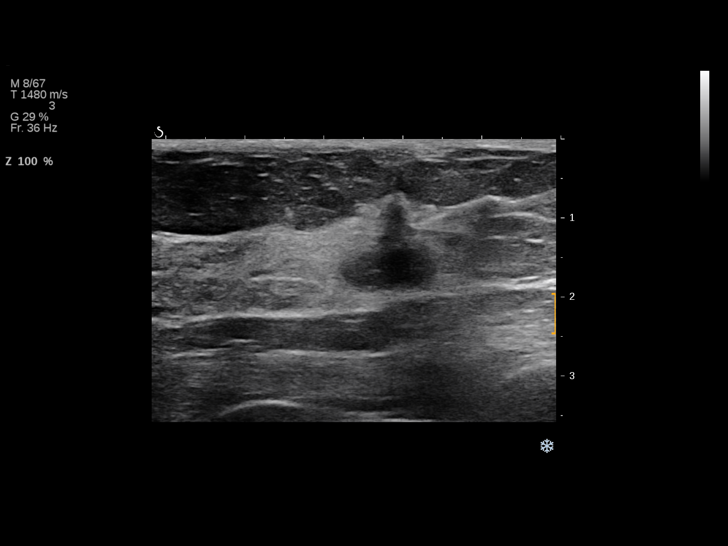
[im 5/14]
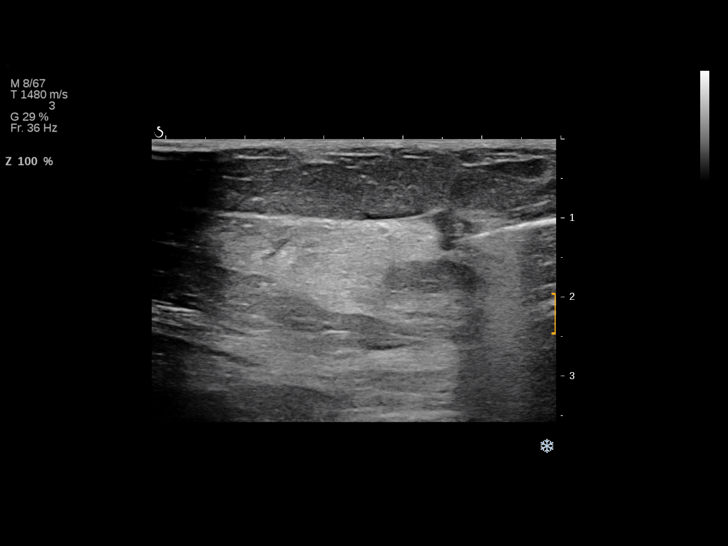
[im 6/14]
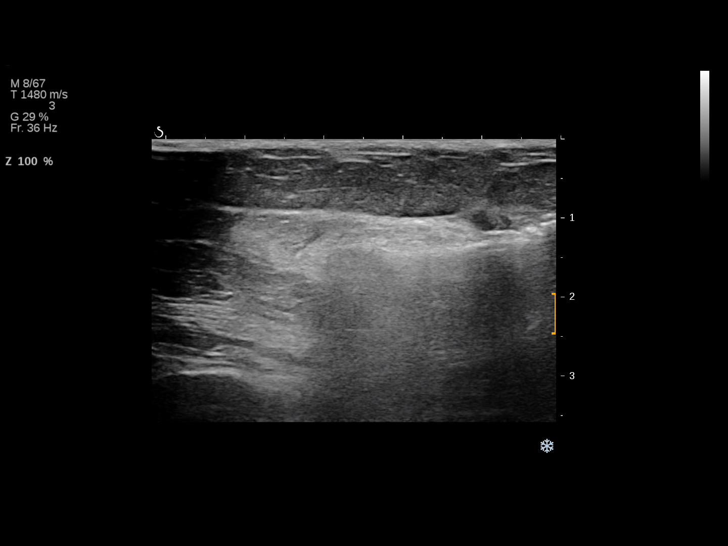
[im 8/14]
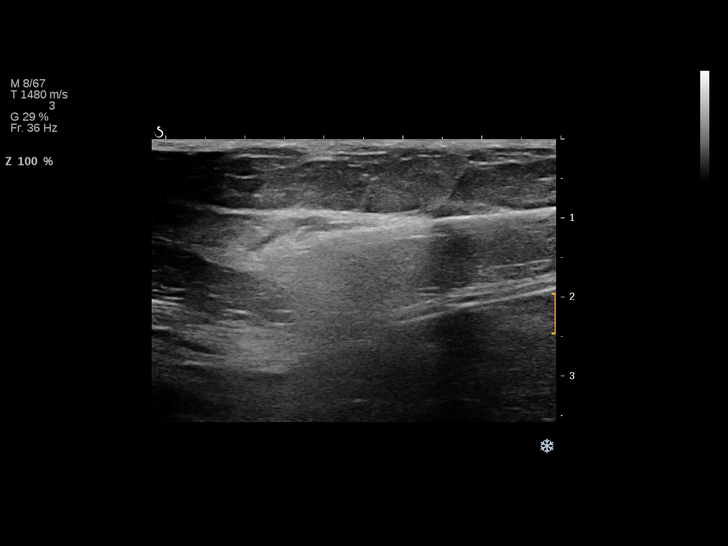
[im 9/14]
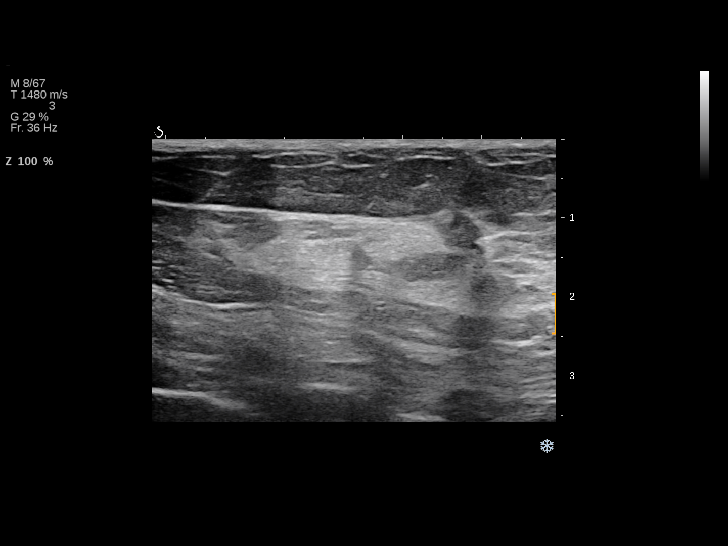
[im 10/14]
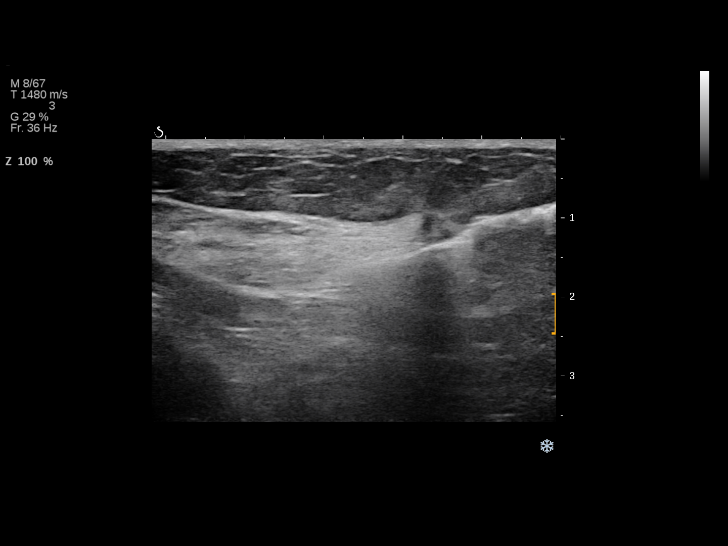
[im 11/14]
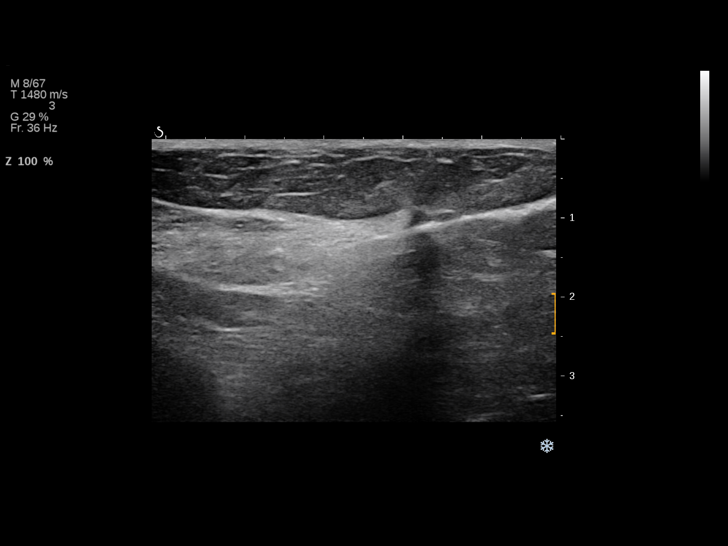
[im 12/14]
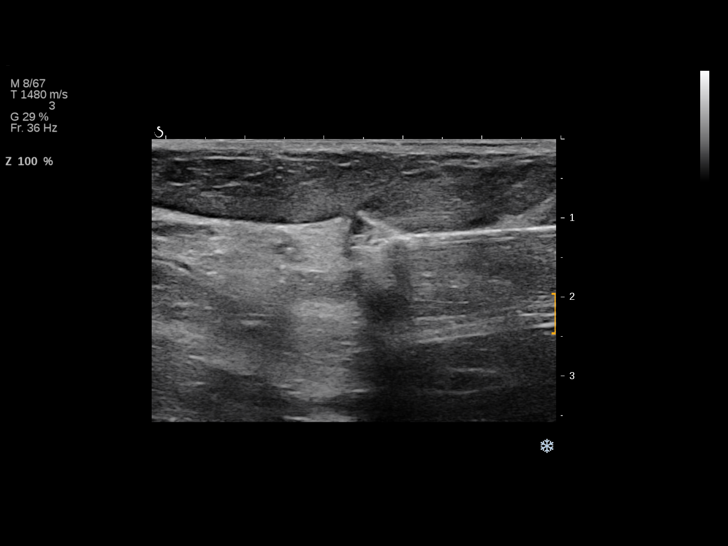
[im 13/14]
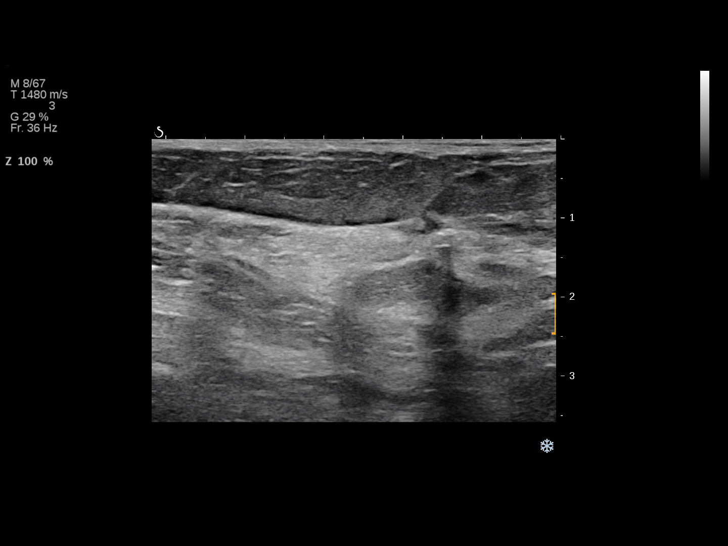
[im 14/14]
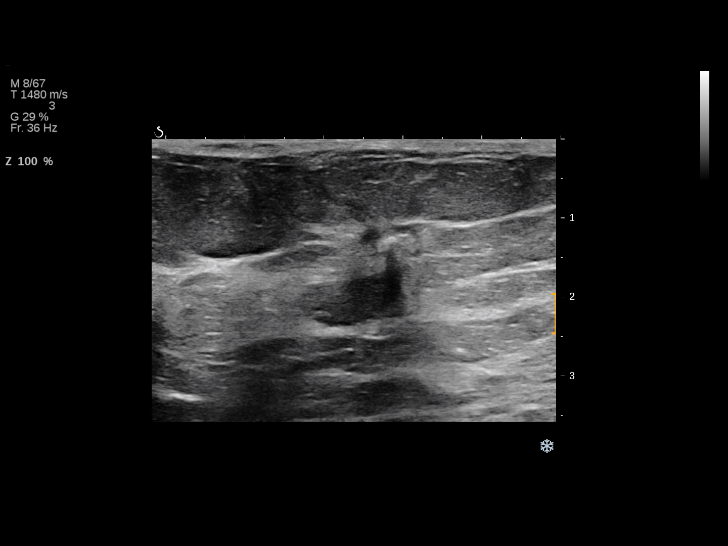

[13 of 14 positions shown; findings below may reference images not displayed]

FINDINGS: Mammographic images were obtained following ultrasound guided biopsy
of an 8 mm mass in the 10 o'clock position of the right breast.
These demonstrate a wing shaped biopsy marker clip at the location
of the mass seen on the recent mammograms in the upper outer right
breast.
IMPRESSION: Appropriate clip deployment following right breast ultrasound-guided
core needle biopsy.

Final Assessment: Post Procedure Mammograms for Marker Placement

ADDENDUM:
Pathology revealed grade 1 invasive ductal carcinoma in the right
breast. This was found to be concordant by Dr. Aleksjo Jusha.
Pathology was discussed with the patient. She reported doing well
after the biopsy with tenderness at the site. Post biopsy
instructions were reviewed and her questions were answered. Surgical
consultation has been scheduled with Dr. Barida Haneef at [REDACTED] on February 03, 2014. She was encouraged
to come to The [REDACTED] for educational
materials. My number was provided for future questions or concerns.

Pathology results reported by Jobalove Guilaine RN, BSN on January 27, 2014.
PROCEDURE:
I met with the patient and we discussed the procedure of
ultrasound-guided biopsy, including benefits and alternatives. We
discussed the high likelihood of a successful procedure. We
discussed the risks of the procedure including infection, bleeding,
tissue injury, clip migration, and inadequate sampling. Informed
written consent was given. The usual time-out protocol was performed
immediately prior to the procedure.

Using sterile technique and 2% Lidocaine as local anesthetic, under
direct ultrasound visualization, a 12 gauge vacuum-assisteddevice
was used to perform biopsy of the recently demonstrated 8 mm mass in
the 10 o'clock position of the right breast, 4 cm from the nipple,
using an inferior approach. At the conclusion of the procedure, a
wing shaped tissue marker clip was deployed into the biopsy cavity.
Follow-up 2-view mammogram was performed and dictated separately.
IMPRESSION: Ultrasound-guided biopsy of an 8 mm mass in the 10 o'clock position
of the right breast. No apparent complications.

## 2016-03-14 IMAGING — MG MM DIGITAL DIAGNOSTIC UNILAT*R*
2 series · 2 of 2 positions shown · non-contrast
Comparison: Previous exams
COMPARISON: Previous exams.

ADDENDUM:
The right diagnostic mammogram report should read as follows:
CLINICAL DATA: Status post ultrasound-guided core needle biopsy of
an 8 mm mass in the 10 o'clock position of the right breast.

EXAM:
DIAGNOSTIC RIGHT MAMMOGRAM POST ULTRASOUND BIOPSY
CLINICAL DATA: 8 mm mass suspicious for malignancy in the 10
o'clock position of the right breast at recent mammography and
ultrasound.
ULTRASOUND GUIDED RIGHT BREAST CORE NEEDLE BIOPSY

[R CC]
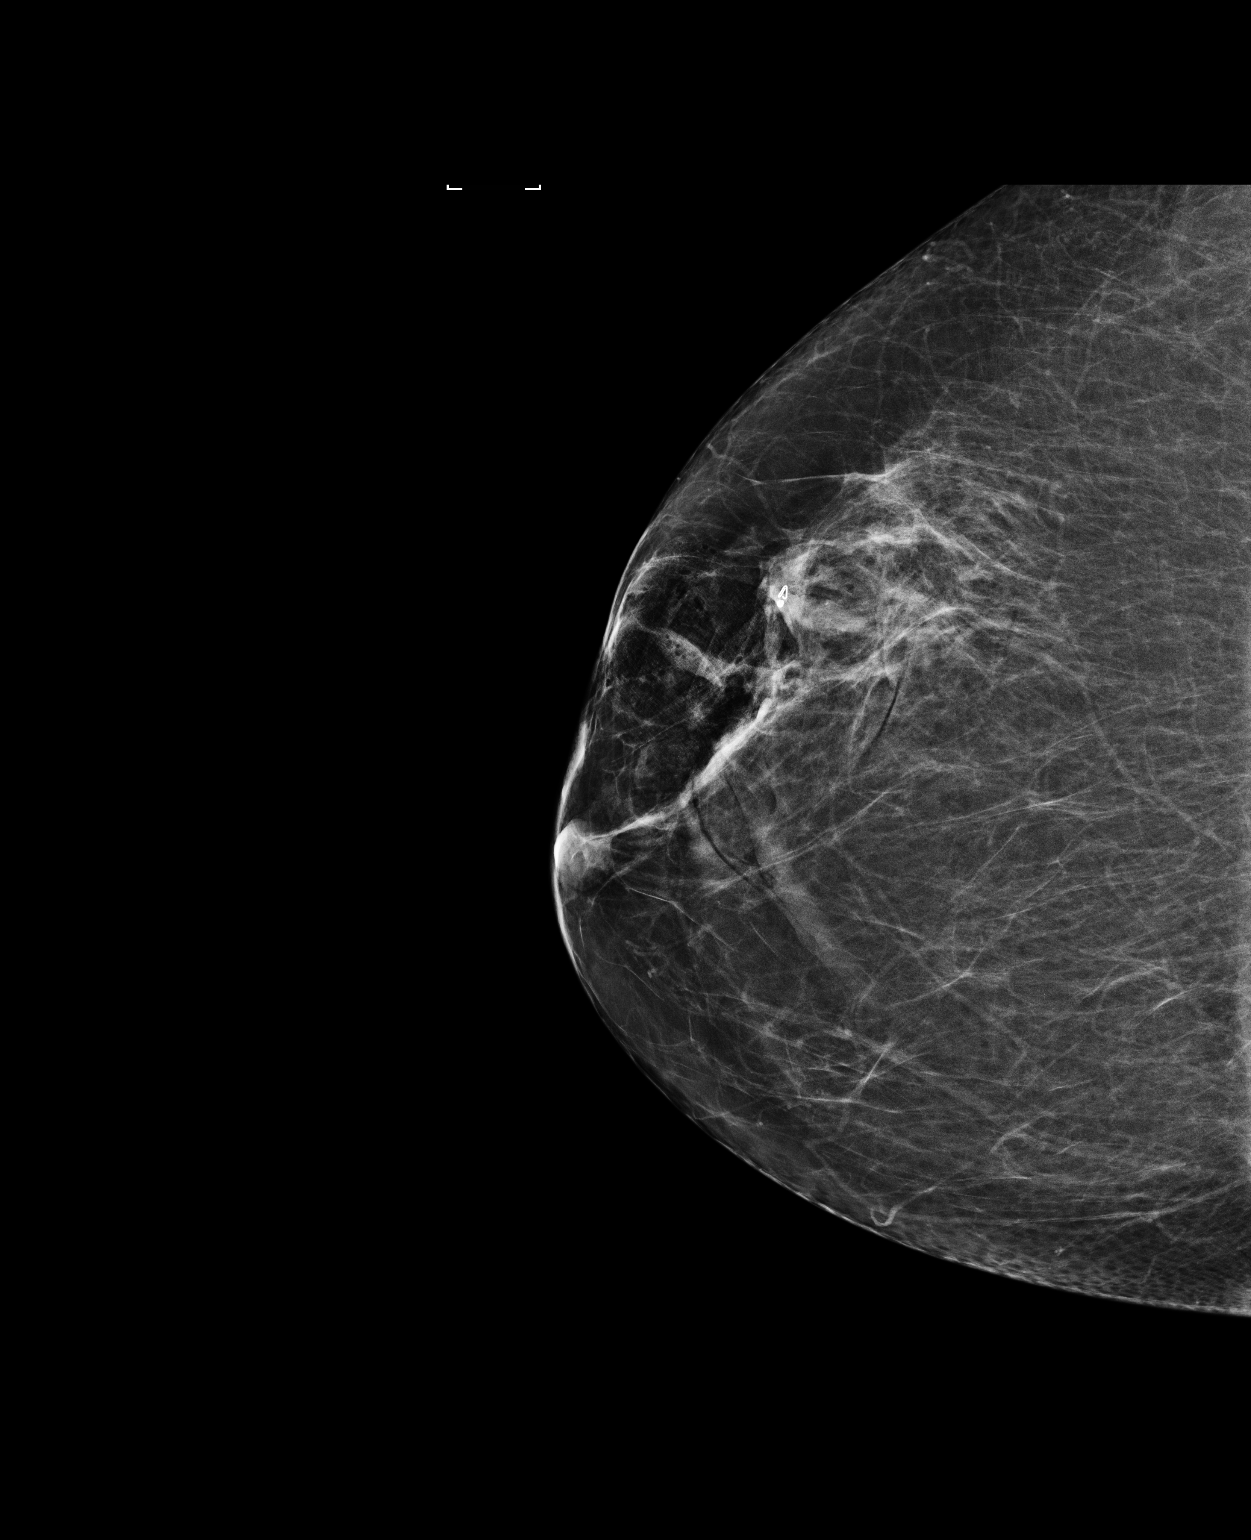

[R ML]
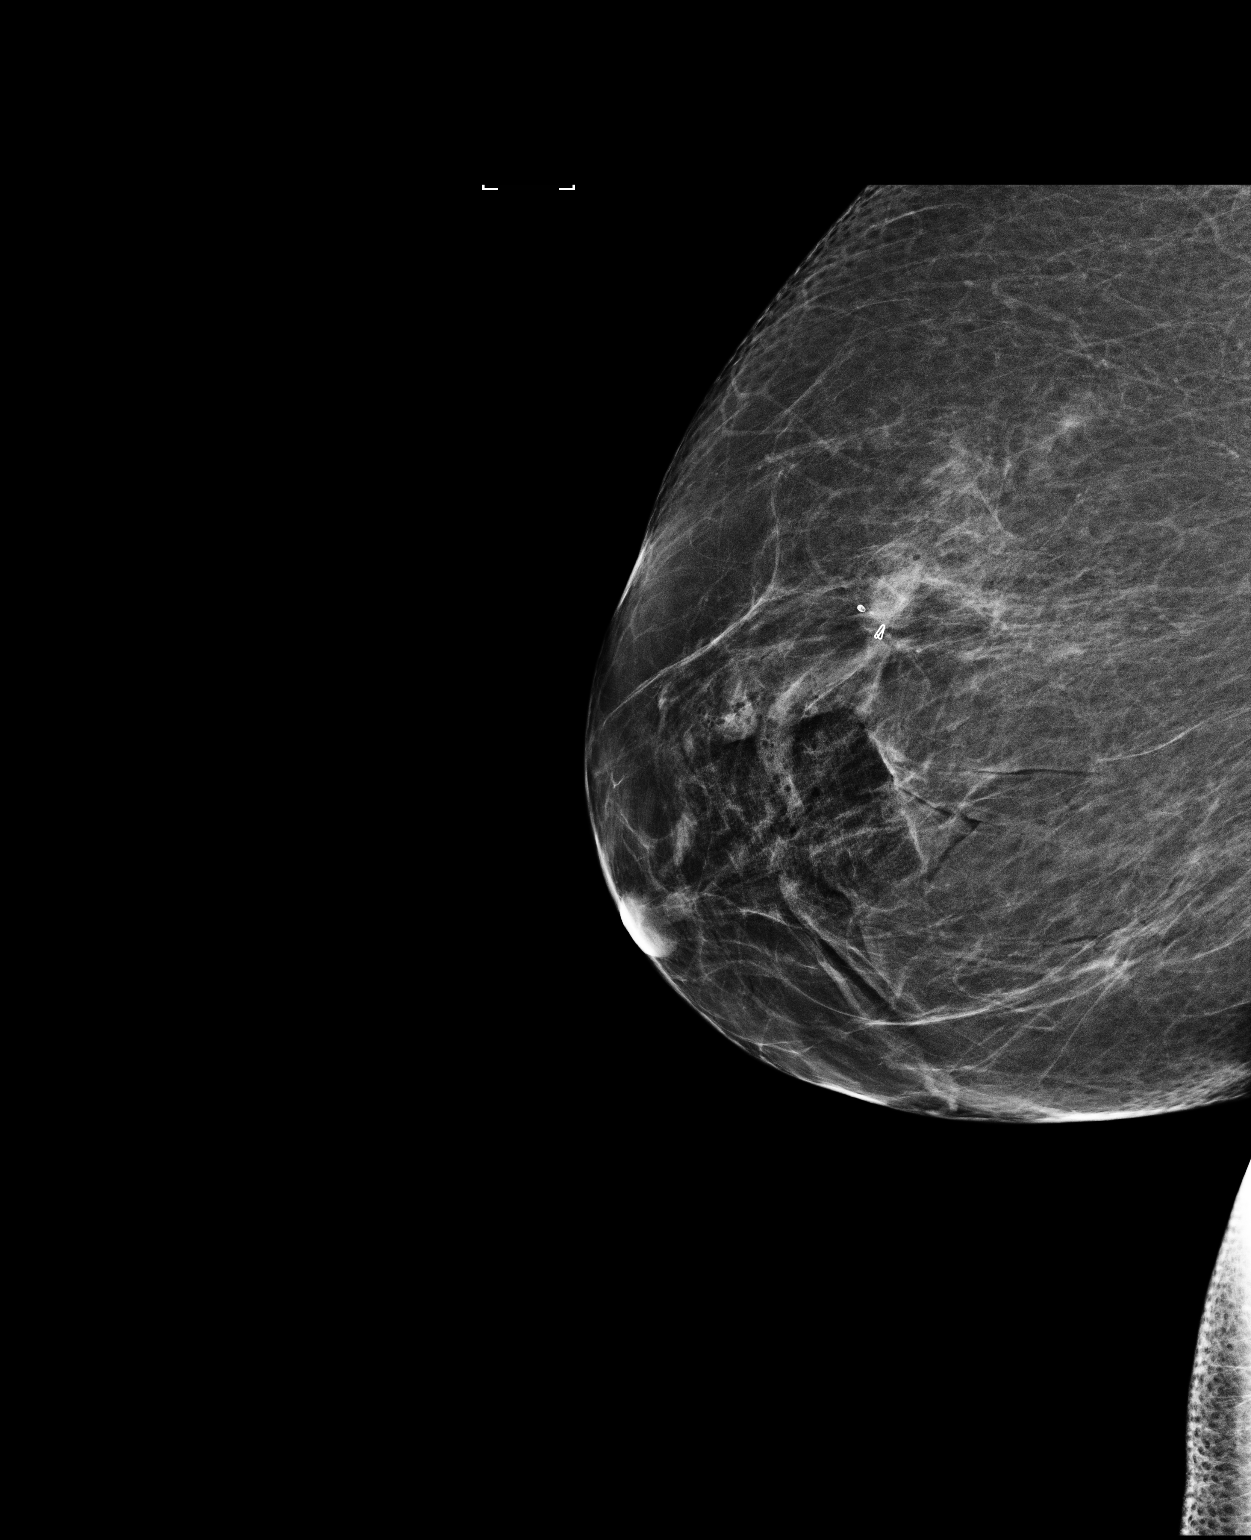

[2 of 2 positions shown; findings below may reference images not displayed]

FINDINGS: Mammographic images were obtained following ultrasound guided biopsy
of an 8 mm mass in the 10 o'clock position of the right breast.
These demonstrate a wing shaped biopsy marker clip at the location
of the mass seen on the recent mammograms in the upper outer right
breast.
IMPRESSION: Appropriate clip deployment following right breast ultrasound-guided
core needle biopsy.

Final Assessment: Post Procedure Mammograms for Marker Placement

ADDENDUM:
Pathology revealed grade 1 invasive ductal carcinoma in the right
breast. This was found to be concordant by Dr. Aleksjo Jusha.
Pathology was discussed with the patient. She reported doing well
after the biopsy with tenderness at the site. Post biopsy
instructions were reviewed and her questions were answered. Surgical
consultation has been scheduled with Dr. Barida Haneef at [REDACTED] on February 03, 2014. She was encouraged
to come to The [REDACTED] for educational
materials. My number was provided for future questions or concerns.

Pathology results reported by Jobalove Guilaine RN, BSN on January 27, 2014.
PROCEDURE:
I met with the patient and we discussed the procedure of
ultrasound-guided biopsy, including benefits and alternatives. We
discussed the high likelihood of a successful procedure. We
discussed the risks of the procedure including infection, bleeding,
tissue injury, clip migration, and inadequate sampling. Informed
written consent was given. The usual time-out protocol was performed
immediately prior to the procedure.

Using sterile technique and 2% Lidocaine as local anesthetic, under
direct ultrasound visualization, a 12 gauge vacuum-assisteddevice
was used to perform biopsy of the recently demonstrated 8 mm mass in
the 10 o'clock position of the right breast, 4 cm from the nipple,
using an inferior approach. At the conclusion of the procedure, a
wing shaped tissue marker clip was deployed into the biopsy cavity.
Follow-up 2-view mammogram was performed and dictated separately.
IMPRESSION: Ultrasound-guided biopsy of an 8 mm mass in the 10 o'clock position
of the right breast. No apparent complications.

## 2016-03-14 MED ORDER — ALBUTEROL SULFATE HFA 108 (90 BASE) MCG/ACT IN AERS: 2.0000 | INHALATION_SPRAY | Freq: Four times a day (QID) | RESPIRATORY_TRACT | 2 refills | Status: DC | PRN

## 2016-03-14 MED ORDER — AZITHROMYCIN 250 MG PO TABS: ORAL_TABLET | ORAL | 0 refills | Status: DC

## 2016-03-14 MED ORDER — IPRATROPIUM-ALBUTEROL 0.5-2.5 (3) MG/3ML IN SOLN
3.0000 mL | Freq: Once | RESPIRATORY_TRACT | Status: AC
  Administered 2016-03-14: 3 mL via RESPIRATORY_TRACT

## 2016-03-14 MED ORDER — HYDROCOD POLST-CPM POLST ER 10-8 MG/5ML PO SUER: 5.0000 mL | Freq: Two times a day (BID) | ORAL | 0 refills | Status: DC | PRN

## 2016-03-14 MED ORDER — PREDNISONE 20 MG PO TABS: ORAL_TABLET | ORAL | 0 refills | Status: DC

## 2016-03-14 NOTE — Addendum Note (Signed)
Addended by: Vonna Kotyk A on: 03/14/2016 12:55 PM   Modules accepted: Orders

## 2016-03-14 NOTE — Progress Notes (Signed)
Patient ID: Brittany Kirk MRN: [id number], DOB: [birth year], 70 y.o. Date of Encounter: @DATE @  Chief Complaint:  Chief Complaint  Patient presents with  . sinus problems    bad cough, x4 days     HPI: 70 y.o. year old white female  presents with above.   Says that she has had a lot of head and nasal congestion as well as bad cough.  She is coughing repeatedly almost nonstop throughout the visit. Says that her "ribs are sore from coughing so much ".  Says that she has used Flonase.  Says this morning she coughed up a chunk of phlegm that was the consistency of mayonnaise but was yellow-green.  Says that she has been using multiple over-the-counter medicines including Delsym and Robitussin and they seem to be doing nothing. Reviewed that she has a history of smoking. States that she quit in 2014. Also reviewed that she is on no inhaled medicines and she confirms that this is accurate.   Past Medical History:  Diagnosis Date  . Allergic rhinitis   . Asymptomatic bilateral carotid artery stenosis   . CAD (coronary artery disease)   . Cancer ([place])   . Complication of anesthesia    sore throat after a surgery  . Depression   . Emphysema    no longer uses inhalers  . Gallstones    nausea, pain upper right abdomen  . GERD (gastroesophageal reflux disease)   . H/O hiatal hernia   . History of skin cancer   . Hyperlipidemia   . Hypertension   . Shortness of breath    sob if walks up flight of stairs  . Tobacco user   . Wears dentures    top     Home Meds: Outpatient Medications Prior to Visit  Medication Sig Dispense Refill  . anastrozole (ARIMIDEX) 1 MG tablet Take 1 tablet (1 mg total) by mouth daily. 90 tablet 4  . aspirin 81 MG tablet Take 81 mg by mouth daily.    Marland Kitchen atorvastatin (LIPITOR) 40 MG tablet Take 1 tablet (40 mg total) by mouth daily. 90 tablet 3  . Cholecalciferol (VITAMIN D3) 5000 UNITS CAPS Take 5,000 Units by mouth daily.    . cyanocobalamin 2000  MCG tablet Take 2,000 mcg by mouth daily. B12 slow release tablet daily    . diazepam (VALIUM) 5 MG tablet TAKE 1 TABLET BY MOUTH AT BEDTIME AS NEEDED FOR ANXIETY  3  . escitalopram (LEXAPRO) 20 MG tablet Take 20 mg by mouth daily.    Marland Kitchen esomeprazole (NEXIUM) 40 MG capsule Take 40 mg by mouth daily at 12 noon.    . furosemide (LASIX) 20 MG tablet Take 1 tablet (20 mg total) by mouth daily. 30 tablet 3  . glucose-Vitamin C 4-0.006 GM CHEW chewable tablet Chew 1 tablet by mouth.    Marland Kitchen HYDROcodone-acetaminophen (NORCO) 5-325 MG tablet Take 1 tablet by mouth every 6 (six) hours as needed for moderate pain. 30 tablet 0  . losartan (COZAAR) 50 MG tablet TAKE 1 TABLET EVERY DAY 90 tablet 3  . metoprolol succinate (TOPROL-XL) 25 MG 24 hr tablet Take 12.5 mg by mouth daily.    . Multiple Vitamin (MULTIVITAMIN) tablet Take 1 tablet by mouth daily.    . vitamin E 200 UNIT capsule Take 200 Units by mouth daily.     No facility-administered medications prior to visit.     Allergies:  Allergies  Allergen Reactions  . Latex   .  Cefaclor Itching, Swelling and Rash  . Metronidazole Itching, Swelling and Rash  . Naproxen Itching, Swelling and Rash  . Nsaids Itching, Swelling and Rash    Can take advil  . Penicillins Itching, Swelling and Rash  . Septra [Bactrim] Itching, Swelling and Rash  . Sulfonamide Derivatives Itching, Swelling and Rash    Social History   Social History  . Marital status: Married    Spouse name: N/A  . Number of children: 1  . Years of education: N/A   Occupational History  . 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  Retired   Social History Main Topics  . Smoking status: Former Smoker    Packs/day: 0.50    Years: 40.00    Types: Cigarettes    Quit date: 02/12/2013  . Smokeless tobacco: Never Used  . Alcohol use No  . Drug use: No  . Sexual activity: Not on file   Other Topics Concern  . Not on file   Social History Narrative  . No narrative on file    Family  History  Problem Relation Age of Onset  . Arthritis Mother   . Heart disease      5 brothers and 2 sister  . Diverticulitis Sister      Review of Systems:  See HPI for pertinent ROS. All other ROS negative.    Physical Exam: Blood pressure (P) 132/70, pulse (P) 64, temperature (P) 98.2 F (36.8 C), temperature source (P) Oral, resp. rate (P) 16, weight (P) 159 lb (72.1 kg), SpO2 (P) 98 %., Body mass index is 30.04 kg/m (pended). General: WNWD WF Appears in no acute distress. Is coughing repeatedly throughout the visit.  Head: Normocephalic, atraumatic, eyes without discharge, sclera non-icteric, nares are without discharge. Bilateral auditory canals clear, TM's are without perforation. Bilateral TMs with area of white scar.  Oral cavity moist, posterior pharynx without exudate, erythema, peritonsillar abscess. Mild tenderness with percussion to right maxillary sinus. No significant tenderness with percussion to frontal or left maxillary sinus regions.  Neck: Supple. No thyromegaly. No lymphadenopathy. Lungs: She has harsh wheeze present throughout bilaterally.  SaO2 on RA is 98%. Heart: RRR with S1 S2. No murmurs, rubs, or gallops. Musculoskeletal:  Strength and tone normal for age. Extremities/Skin: Warm and dry.  Neuro: Alert and oriented X 3. Moves all extremities spontaneously. Gait is normal. CNII-XII grossly in tact. Psych:  Responds to questions appropriately with a normal affect.     ASSESSMENT AND PLAN:  70 y.o. year old female with  72. EMPHYSEMA - albuterol (PROVENTIL HFA;VENTOLIN HFA) 108 (90 Base) MCG/ACT inhaler; Inhale 2 puffs into the lungs every 6 (six) hours as needed for wheezing or shortness of breath.  Dispense: 1 Inhaler; Refill: 2  70. Bacterial respiratory infection - azithromycin (ZITHROMAX) 250 MG tablet; Day 1: Take 2 daily.  Days 2-5: Take 1 daily.  Dispense: 6 tablet; Refill: 0 - chlorpheniramine-HYDROcodone (TUSSIONEX PENNKINETIC ER) 10-8 MG/5ML SUER;  Take 5 mLs by mouth every 12 (twelve) hours as needed for cough.  Dispense: 140 mL; Refill: 0  3. COPD exacerbation (HCC) - predniSONE (DELTASONE) 20 MG tablet; Take 2 daily for 3 days then take 1 daily for 4 days  Dispense: 10 tablet; Refill: 0 - albuterol (PROVENTIL HFA;VENTOLIN HFA) 108 (90 Base) MCG/ACT inhaler; Inhale 2 puffs into the lungs every 6 (six) hours as needed for wheezing or shortness of breath.  Dispense: 1 Inhaler; Refill: 2 - chlorpheniramine-HYDROcodone (TUSSIONEX PENNKINETIC ER) 10-8 MG/5ML SUER; Take 5  mLs by mouth every 12 (twelve) hours as needed for cough.  Dispense: 140 mL; Refill: 0  DuoNeb given here in office.  She is to start the azithromycin and the prednisone immediately and take as directed. She can use Tussionex as needed as cough suppressant. Use the albuterol inhaler as needed for wheezing.  Follow-up if breathing worsens or if not improved in 48 hours and if not back to normal baseline in 1-2 weeks.  [address] [place], [place], [facility] 03/14/2016 12:29 PM

## 2016-03-22 ENCOUNTER — Ambulatory Visit (INDEPENDENT_AMBULATORY_CARE_PROVIDER_SITE_OTHER): Payer: Medicare Other | Admitting: Family Medicine

## 2016-03-22 ENCOUNTER — Encounter: Payer: Self-pay | Admitting: Family Medicine

## 2016-03-22 VITALS — BP 160/80 | HR 66 | Temp 97.8°F | Resp 20 | Ht 61.0 in | Wt 155.0 lb

## 2016-03-22 DIAGNOSIS — I2510 Atherosclerotic heart disease of native coronary artery without angina pectoris: Secondary | ICD-10-CM

## 2016-03-22 DIAGNOSIS — J988 Other specified respiratory disorders: Secondary | ICD-10-CM

## 2016-03-22 DIAGNOSIS — J438 Other emphysema: Secondary | ICD-10-CM | POA: Diagnosis not present

## 2016-03-22 DIAGNOSIS — B9689 Other specified bacterial agents as the cause of diseases classified elsewhere: Secondary | ICD-10-CM

## 2016-03-22 MED ORDER — LEVOFLOXACIN 500 MG PO TABS: 500.0000 mg | ORAL_TABLET | Freq: Every day | ORAL | 0 refills | Status: DC

## 2016-03-22 NOTE — Progress Notes (Signed)
Subjective:    Patient ID: Brittany Kirk, female    DOB: [birth year], 70 y.o.   MRN: [id number]  HPI Patient presents with several weeks of cough, wheezing, cough productive of green sputum, subjective fevers and chills. Today on examination she has faint expiratory wheezing, diminished breath sounds. There are no crackles Rales. She was given a Z-Pak, prednisone taper pack, and some albuterol by my partner. The prednisone seemed to help but the symptoms have returned as soon as she stopped the antibiotics Past Medical History:  Diagnosis Date  . Allergic rhinitis   . Asymptomatic bilateral carotid artery stenosis   . CAD (coronary artery disease)   . Cancer ([place])   . Complication of anesthesia    sore throat after a surgery  . Depression   . Emphysema    no longer uses inhalers  . Gallstones    nausea, pain upper right abdomen  . GERD (gastroesophageal reflux disease)   . H/O hiatal hernia   . History of skin cancer   . Hyperlipidemia   . Hypertension   . Shortness of breath    sob if walks up flight of stairs  . Tobacco user   . Wears dentures    top   Past Surgical History:  Procedure Laterality Date  . CHOLECYSTECTOMY  07/08/2012   Procedure: LAPAROSCOPIC CHOLECYSTECTOMY WITH INTRAOPERATIVE CHOLANGIOGRAM;  Surgeon: Gayland Curry, MD,FACS;  Location: [facility];  Service: General;  Laterality: N/A;  Laparoscopic Cholecystectomy with Intraoperative Cholangiogram  . COLONOSCOPY    . TONSILECTOMY, ADENOIDECTOMY, BILATERAL MYRINGOTOMY AND TUBES    . TUBAL LIGATION     Current Outpatient Prescriptions on File Prior to Visit  Medication Sig Dispense Refill  . albuterol (PROVENTIL HFA;VENTOLIN HFA) 108 (90 Base) MCG/ACT inhaler Inhale 2 puffs into the lungs every 6 (six) hours as needed for wheezing or shortness of breath. 1 Inhaler 2  . anastrozole (ARIMIDEX) 1 MG tablet Take 1 tablet (1 mg total) by mouth daily. 90 tablet 4  . aspirin 81 MG tablet Take 81 mg by mouth daily.      Marland Kitchen atorvastatin (LIPITOR) 40 MG tablet Take 1 tablet (40 mg total) by mouth daily. 90 tablet 3  . chlorpheniramine-HYDROcodone (TUSSIONEX PENNKINETIC ER) 10-8 MG/5ML SUER Take 5 mLs by mouth every 12 (twelve) hours as needed for cough. 140 mL 0  . Cholecalciferol (VITAMIN D3) 5000 UNITS CAPS Take 5,000 Units by mouth daily.    . cyanocobalamin 2000 MCG tablet Take 2,000 mcg by mouth daily. B12 slow release tablet daily    . diazepam (VALIUM) 5 MG tablet TAKE 1 TABLET BY MOUTH AT BEDTIME AS NEEDED FOR ANXIETY  3  . escitalopram (LEXAPRO) 20 MG tablet Take 20 mg by mouth daily.    Marland Kitchen esomeprazole (NEXIUM) 40 MG capsule Take 40 mg by mouth daily at 12 noon.    . furosemide (LASIX) 20 MG tablet Take 1 tablet (20 mg total) by mouth daily. 30 tablet 3  . glucose-Vitamin C 4-0.006 GM CHEW chewable tablet Chew 1 tablet by mouth.    Marland Kitchen HYDROcodone-acetaminophen (NORCO) 5-325 MG tablet Take 1 tablet by mouth every 6 (six) hours as needed for moderate pain. 30 tablet 0  . losartan (COZAAR) 50 MG tablet TAKE 1 TABLET EVERY DAY 90 tablet 3  . metoprolol succinate (TOPROL-XL) 25 MG 24 hr tablet Take 12.5 mg by mouth daily.    . Multiple Vitamin (MULTIVITAMIN) tablet Take 1 tablet by mouth daily.    Marland Kitchen  vitamin E 200 UNIT capsule Take 200 Units by mouth daily.     No current facility-administered medications on file prior to visit.    Allergies  Allergen Reactions  . Latex   . Cefaclor Itching, Swelling and Rash  . Metronidazole Itching, Swelling and Rash  . Naproxen Itching, Swelling and Rash  . Nsaids Itching, Swelling and Rash    Can take advil  . Penicillins Itching, Swelling and Rash  . Septra [Bactrim] Itching, Swelling and Rash  . Sulfonamide Derivatives Itching, Swelling and Rash   Social History   Social History  . Marital status: Married    Spouse name: N/A  . Number of children: 1  . Years of education: N/A   Occupational History  . 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  Retired   Social  History Main Topics  . Smoking status: Former Smoker    Packs/day: 0.50    Years: 40.00    Types: Cigarettes    Quit date: 02/12/2013  . Smokeless tobacco: Never Used  . Alcohol use No  . Drug use: No  . Sexual activity: Not on file   Other Topics Concern  . Not on file   Social History Narrative  . No narrative on file      Review of Systems  All other systems reviewed and are negative.      Objective:   Physical Exam  Cardiovascular: Normal rate, regular rhythm and normal heart sounds.   Pulmonary/Chest: Effort normal. No respiratory distress. She has wheezes. She has no rales. She exhibits no tenderness.  Abdominal: Soft. Bowel sounds are normal.  Musculoskeletal: She exhibits edema.  Vitals reviewed.         Assessment & Plan:  EMPHYSEMA - Plan: levofloxacin (LEVAQUIN) 500 MG tablet  Bacterial respiratory infection - Plan: levofloxacin (LEVAQUIN) 500 MG tablet  Start Symbicort 160/4.5, 2 puffs inhaled twice daily. Begin Levaquin 500 mg by mouth daily for 7 days. Recheck in one week sooner if worse.

## 2016-04-18 ENCOUNTER — Encounter: Payer: Self-pay | Admitting: Emergency Medicine

## 2016-04-18 ENCOUNTER — Ambulatory Visit (INDEPENDENT_AMBULATORY_CARE_PROVIDER_SITE_OTHER): Payer: Medicare Other | Admitting: Emergency Medicine

## 2016-04-18 VITALS — BP 124/72 | HR 60 | Ht 61.0 in | Wt 154.0 lb

## 2016-04-18 DIAGNOSIS — J449 Chronic obstructive pulmonary disease, unspecified: Secondary | ICD-10-CM

## 2016-04-18 DIAGNOSIS — J438 Other emphysema: Secondary | ICD-10-CM | POA: Diagnosis not present

## 2016-04-18 DIAGNOSIS — I2510 Atherosclerotic heart disease of native coronary artery without angina pectoris: Secondary | ICD-10-CM | POA: Diagnosis not present

## 2016-04-18 NOTE — Patient Instructions (Signed)
We will restart allegra 180mg  daily Try using Atrovent NS, 2 sprays each side 2-3 times a day  We will perform full pulmonary function testing at your next visit.  Depending on your breathing testing we may decide to restart an every-day inhaled medication.  Follow with Dr Lamonte Sakai next available with full PFT.

## 2016-04-18 NOTE — Assessment & Plan Note (Signed)
Poorly controlled for last month, likely allergic. No response with flonase. Will add back allegra and start atrovent NS.

## 2016-04-18 NOTE — Progress Notes (Signed)
Subjective:    Patient ID: Brittany Kirk, female    DOB: Jul 18, 1945, 70 y.o.   MRN: [id number]  HPI 70 year old former smoker with a history of COPD, HTN, coronary artery disease, allergic rhinitis, hiatal hernia with GERD, breast CA s/p R lumpectomy + anastrozole. Has been followed by Dr Joya Gaskins before for her COPD. Also had some stable pulm nodules that were followed by CT chest, last was reassuring in 11/14/10.   She returns today reporting that she has experienced continuous cough since about 1 month ago. She was treated for an acute bronchitis about 6 weeks ago, improved for a while, but then developed sneezing, congestion and cough that is occasionally productive of clear mucous. She was started on a sample of symbicort a month ago, though it might have helped her. She has tried flonase without improvement, allegra, now off of these.    Review of Systems  Constitutional: Negative for fever and unexpected weight change.  HENT: Positive for congestion and sneezing. Negative for dental problem, ear pain, nosebleeds, postnasal drip, rhinorrhea, sinus pressure, sore throat and trouble swallowing.   Eyes: Negative for redness and itching.  Respiratory: Positive for cough and shortness of breath. Negative for chest tightness and wheezing.   Cardiovascular: Negative for palpitations and leg swelling.  Gastrointestinal: Negative for nausea and vomiting.  Genitourinary: Negative for dysuria.  Musculoskeletal: Negative for joint swelling.  Skin: Negative for rash.  Neurological: Negative for headaches.  Hematological: Does not bruise/bleed easily.  Psychiatric/Behavioral: Negative for dysphoric mood. The patient is nervous/anxious.     Past Medical History:  Diagnosis Date  . Allergic rhinitis   . Asymptomatic bilateral carotid artery stenosis   . CAD (coronary artery disease)   . Cancer ([place])   . Complication of anesthesia    sore throat after a surgery  . Depression   . Emphysema    no longer uses inhalers  . Gallstones    nausea, pain upper right abdomen  . GERD (gastroesophageal reflux disease)   . H/O hiatal hernia   . History of skin cancer   . Hyperlipidemia   . Hypertension   . Shortness of breath    sob if walks up flight of stairs  . Tobacco user   . Wears dentures    top     Family History  Problem Relation Age of Onset  . Arthritis Mother   . Heart disease      5 brothers and 2 sister  . Diverticulitis Sister      Social History   Social History  . Marital status: Married    Spouse name: N/A  . Number of children: 1  . Years of education: N/A   Occupational History  . 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  Retired   Social History Main Topics  . Smoking status: Former Smoker    Packs/day: 0.50    Years: 40.00    Types: Cigarettes    Quit date: 02/13/2011  . Smokeless tobacco: Never Used  . Alcohol use No  . Drug use: No  . Sexual activity: Not on file   Other Topics Concern  . Not on file   Social History Narrative  . No narrative on file     Allergies  Allergen Reactions  . Latex   . Cefaclor Itching, Swelling and Rash  . Metronidazole Itching, Swelling and Rash  . Naproxen Itching, Swelling and Rash  . Nsaids Itching, Swelling and Rash    Can take  advil  . Penicillins Itching, Swelling and Rash  . Septra [Bactrim] Itching, Swelling and Rash  . Sulfonamide Derivatives Itching, Swelling and Rash     Outpatient Medications Prior to Visit  Medication Sig Dispense Refill  . albuterol (PROVENTIL HFA;VENTOLIN HFA) 108 (90 Base) MCG/ACT inhaler Inhale 2 puffs into the lungs every 6 (six) hours as needed for wheezing or shortness of breath. 1 Inhaler 2  . anastrozole (ARIMIDEX) 1 MG tablet Take 1 tablet (1 mg total) by mouth daily. 90 tablet 4  . aspirin 81 MG tablet Take 81 mg by mouth daily.    Marland Kitchen atorvastatin (LIPITOR) 40 MG tablet Take 1 tablet (40 mg total) by mouth daily. 90 tablet 3  . Cholecalciferol (VITAMIN D3) 5000  UNITS CAPS Take 5,000 Units by mouth daily.    . cyanocobalamin 2000 MCG tablet Take 2,000 mcg by mouth daily. B12 slow release tablet daily    . diazepam (VALIUM) 5 MG tablet TAKE 1 TABLET BY MOUTH AT BEDTIME AS NEEDED FOR ANXIETY  3  . escitalopram (LEXAPRO) 20 MG tablet Take 20 mg by mouth daily.    Marland Kitchen esomeprazole (NEXIUM) 40 MG capsule Take 40 mg by mouth daily at 12 noon.    . furosemide (LASIX) 20 MG tablet Take 1 tablet (20 mg total) by mouth daily. 30 tablet 3  . glucose-Vitamin C 4-0.006 GM CHEW chewable tablet Chew 1 tablet by mouth.    Marland Kitchen HYDROcodone-acetaminophen (NORCO) 5-325 MG tablet Take 1 tablet by mouth every 6 (six) hours as needed for moderate pain. 30 tablet 0  . losartan (COZAAR) 50 MG tablet TAKE 1 TABLET EVERY DAY 90 tablet 3  . metoprolol succinate (TOPROL-XL) 25 MG 24 hr tablet Take 12.5 mg by mouth daily.    . Multiple Vitamin (MULTIVITAMIN) tablet Take 1 tablet by mouth daily.    . vitamin E 200 UNIT capsule Take 200 Units by mouth daily.    . chlorpheniramine-HYDROcodone (TUSSIONEX PENNKINETIC ER) 10-8 MG/5ML SUER Take 5 mLs by mouth every 12 (twelve) hours as needed for cough. 140 mL 0  . levofloxacin (LEVAQUIN) 500 MG tablet Take 1 tablet (500 mg total) by mouth daily. 7 tablet 0   No facility-administered medications prior to visit.         Objective:   Physical Exam Vitals:   04/18/16 1528 04/18/16 1529  BP:  124/72  Pulse:  60  SpO2:  98%  Weight: 154 lb (69.9 kg)   Height: 5\' 1"  (1.549 m)    Gen: Pleasant, well-nourished, in no distress,  normal affect  ENT: No lesions,  mouth clear,  oropharynx clear, no postnasal drip  Neck: No JVD, no TMG, no carotid bruits  Lungs: No use of accessory muscles, no dullness to percussion, clear without rales or rhonchi  Cardiovascular: RRR, heart sounds normal, no murmur or gallops, no peripheral edema  Musculoskeletal: No deformities, no cyanosis or clubbing  Neuro: alert, non focal  Skin: Warm, no  lesions or rashes      Assessment & Plan:  Rhinitis Poorly controlled for last month, likely allergic. No response with flonase. Will add back allegra and start atrovent NS.   EMPHYSEMA History of COPD, last primary function testing was several years ago. She needs a repeat PFT. I will hold off on starting scheduled bronchodilators until her testing is completed.  Baltazar Apo, MD, PhD 04/18/2016, 3:54 PM [facility] [phone] or if no answer [phone] .Marland Kitchen/.

## 2016-04-18 NOTE — Assessment & Plan Note (Signed)
History of COPD, last primary function testing was several years ago. She needs a repeat PFT. I will hold off on starting scheduled bronchodilators until her testing is completed.

## 2016-04-20 ENCOUNTER — Other Ambulatory Visit: Payer: Self-pay | Admitting: Oncology

## 2016-06-16 ENCOUNTER — Other Ambulatory Visit: Payer: Self-pay | Admitting: Family Medicine

## 2016-06-19 ENCOUNTER — Ambulatory Visit: Payer: Medicare Other | Admitting: Emergency Medicine

## 2016-06-28 ENCOUNTER — Ambulatory Visit (INDEPENDENT_AMBULATORY_CARE_PROVIDER_SITE_OTHER): Payer: Medicare Other

## 2016-06-28 ENCOUNTER — Encounter (HOSPITAL_COMMUNITY): Payer: Self-pay | Admitting: Emergency Medicine

## 2016-06-28 ENCOUNTER — Ambulatory Visit (HOSPITAL_COMMUNITY)
Admission: EM | Admit: 2016-06-28 | Discharge: 2016-06-28 | Disposition: A | Discharge status: Home / Self Care | Payer: Medicare Other | Attending: Emergency Medicine | Admitting: Emergency Medicine

## 2016-06-28 DIAGNOSIS — J40 Bronchitis, not specified as acute or chronic: Secondary | ICD-10-CM

## 2016-06-28 DIAGNOSIS — J9811 Atelectasis: Secondary | ICD-10-CM | POA: Diagnosis not present

## 2016-06-28 MED ORDER — DEXAMETHASONE SODIUM PHOSPHATE 10 MG/ML IJ SOLN
INTRAMUSCULAR | Status: AC
  Filled 2016-06-28: qty 1

## 2016-06-28 MED ORDER — PREDNISONE 10 MG (21) PO TBPK: ORAL_TABLET | ORAL | 0 refills | Status: DC

## 2016-06-28 MED ORDER — IPRATROPIUM-ALBUTEROL 0.5-2.5 (3) MG/3ML IN SOLN
RESPIRATORY_TRACT | Status: AC
  Filled 2016-06-28: qty 3

## 2016-06-28 MED ORDER — IPRATROPIUM-ALBUTEROL 0.5-2.5 (3) MG/3ML IN SOLN
3.0000 mL | Freq: Once | RESPIRATORY_TRACT | Status: AC
  Administered 2016-06-28: 3 mL via RESPIRATORY_TRACT

## 2016-06-28 MED ORDER — BENZONATATE 100 MG PO CAPS: 100.0000 mg | ORAL_CAPSULE | Freq: Three times a day (TID) | ORAL | 0 refills | Status: DC

## 2016-06-28 MED ORDER — DEXAMETHASONE SODIUM PHOSPHATE 10 MG/ML IJ SOLN
10.0000 mg | Freq: Once | INTRAMUSCULAR | Status: AC
  Administered 2016-06-28: 10 mg via INTRAMUSCULAR

## 2016-06-28 MED ORDER — AZITHROMYCIN 250 MG PO TABS: 250.0000 mg | ORAL_TABLET | Freq: Every day | ORAL | 0 refills | Status: DC

## 2016-06-28 NOTE — ED Provider Notes (Signed)
CSN: [id number]     Arrival date & time 06/28/16  1427 History   First MD Initiated Contact with Patient 06/28/16 1548     Chief Complaint  Patient presents with  . URI   (Consider location/radiation/quality/duration/timing/severity/associated sxs/prior Treatment) 71 year old female presents to clinic with chief complaint for cough, shortness of breath, fever, muscle aches, and congestion for [redacted] weeks along with decreased appetite. She has had no nausea or vomiting but reports having had diarrhea yesterday. She is a former smoker with history of emphysema. She reports having to have use her rescue inhaler yesterday without relief.     The history is provided by the patient.  URI  Presenting symptoms: congestion, cough, fatigue, fever and rhinorrhea   Associated symptoms: myalgias and wheezing   Associated symptoms: no sinus pain     Past Medical History:  Diagnosis Date  . Allergic rhinitis   . Asymptomatic bilateral carotid artery stenosis   . CAD (coronary artery disease)   . Cancer ([place])   . Complication of anesthesia    sore throat after a surgery  . Depression   . Emphysema    no longer uses inhalers  . Gallstones    nausea, pain upper right abdomen  . GERD (gastroesophageal reflux disease)   . H/O hiatal hernia   . History of skin cancer   . Hyperlipidemia   . Hypertension   . Shortness of breath    sob if walks up flight of stairs  . Tobacco user   . Wears dentures    top   Past Surgical History:  Procedure Laterality Date  . CHOLECYSTECTOMY  07/08/2012   Procedure: LAPAROSCOPIC CHOLECYSTECTOMY WITH INTRAOPERATIVE CHOLANGIOGRAM;  Surgeon: Gayland Curry, MD,FACS;  Location: [facility];  Service: General;  Laterality: N/A;  Laparoscopic Cholecystectomy with Intraoperative Cholangiogram  . COLONOSCOPY    . TONSILECTOMY, ADENOIDECTOMY, BILATERAL MYRINGOTOMY AND TUBES    . TUBAL LIGATION     Family History  Problem Relation Age of Onset  . Arthritis Mother   . Heart  disease      5 brothers and 2 sister  . Diverticulitis Sister    Social History  Substance Use Topics  . Smoking status: Former Smoker    Packs/day: 0.50    Years: 40.00    Types: Cigarettes    Quit date: 02/13/2011  . Smokeless tobacco: Never Used  . Alcohol use No   OB History    No data available     Review of Systems  Constitutional: Positive for appetite change, chills, fatigue and fever.  HENT: Positive for congestion and rhinorrhea. Negative for sinus pain and sinus pressure.   Respiratory: Positive for cough, chest tightness, shortness of breath and wheezing.   Cardiovascular: Negative.   Gastrointestinal: Positive for diarrhea. Negative for abdominal pain, nausea and vomiting.  Genitourinary: Negative.   Musculoskeletal: Positive for myalgias.  Neurological: Negative.     Allergies  Latex; Cefaclor; Metronidazole; Naproxen; Nsaids; Penicillins; Septra [bactrim]; and Sulfonamide derivatives  Home Medications   Prior to Admission medications   Medication Sig Start Date End Date Taking? Authorizing Provider  albuterol (PROVENTIL HFA;VENTOLIN HFA) 108 (90 Base) MCG/ACT inhaler Inhale 2 puffs into the lungs every 6 (six) hours as needed for wheezing or shortness of breath. 03/14/16  Yes Mary B Dixon, PA-C  anastrozole (ARIMIDEX) 1 MG tablet Take 1 tablet (1 mg total) by mouth daily. 02/23/16  Yes Chauncey Cruel, MD  anastrozole (ARIMIDEX) 1 MG tablet TAKE 1  TABLET EVERY DAY 04/20/16  Yes Chauncey Cruel, MD  aspirin 81 MG tablet Take 81 mg by mouth daily.   Yes Historical Provider, MD  Cholecalciferol (VITAMIN D3) 5000 UNITS CAPS Take 5,000 Units by mouth daily.   Yes Historical Provider, MD  diazepam (VALIUM) 5 MG tablet TAKE 1 TABLET BY MOUTH AT BEDTIME AS NEEDED FOR ANXIETY 07/19/15  Yes Historical Provider, MD  escitalopram (LEXAPRO) 20 MG tablet Take 20 mg by mouth daily.   Yes Historical Provider, MD  esomeprazole (NEXIUM) 40 MG capsule Take 40 mg by mouth daily at  12 noon.   Yes Historical Provider, MD  glucose-Vitamin C 4-0.006 GM CHEW chewable tablet Chew 1 tablet by mouth.   Yes Historical Provider, MD  losartan (COZAAR) 50 MG tablet TAKE 1 TABLET EVERY DAY 06/18/16  Yes Susy Frizzle, MD  metoprolol succinate (TOPROL-XL) 25 MG 24 hr tablet Take 12.5 mg by mouth daily.   Yes Historical Provider, MD  Multiple Vitamin (MULTIVITAMIN) tablet Take 1 tablet by mouth daily.   Yes Historical Provider, MD  atorvastatin (LIPITOR) 40 MG tablet Take 1 tablet (40 mg total) by mouth daily. 02/22/16 05/22/16  Susy Frizzle, MD  azithromycin (ZITHROMAX) 250 MG tablet Take 1 tablet (250 mg total) by mouth daily. Take first 2 tablets together, then 1 every day until finished. 06/28/16   Barnet Glasgow, NP  benzonatate (TESSALON) 100 MG capsule Take 1 capsule (100 mg total) by mouth every 8 (eight) hours. 06/28/16   Barnet Glasgow, NP  cyanocobalamin 2000 MCG tablet Take 2,000 mcg by mouth daily. B12 slow release tablet daily    Historical Provider, MD  furosemide (LASIX) 20 MG tablet Take 1 tablet (20 mg total) by mouth daily. 03/08/16   Susy Frizzle, MD  HYDROcodone-acetaminophen (NORCO) 5-325 MG tablet Take 1 tablet by mouth every 6 (six) hours as needed for moderate pain. 11/21/15   Susy Frizzle, MD  predniSONE (STERAPRED UNI-PAK 21 TAB) 10 MG (21) TBPK tablet Take 6 tablets today, then decrease by 1 each day till finished (6,5,4,3,2,1) 06/28/16   Barnet Glasgow, NP  vitamin E 200 UNIT capsule Take 200 Units by mouth daily.    Historical Provider, MD   Meds Ordered and Administered this Visit   Medications  ipratropium-albuterol (DUONEB) 0.5-2.5 (3) MG/3ML nebulizer solution 3 mL (3 mLs Nebulization Given 06/28/16 1558)  dexamethasone (DECADRON) injection 10 mg (10 mg Intramuscular Given 06/28/16 1558)    BP (!) 121/54 (BP Location: Left Arm)   Pulse (!) 59   Temp 98.5 F (36.9 C) (Oral)   Resp 20   SpO2 97%  No data found.   Physical Exam   Constitutional: She is oriented to person, place, and time. She appears well-developed and well-nourished. She appears ill.  HENT:  Head: Normocephalic.  Right Ear: External ear normal.  Left Ear: External ear normal.  Mouth/Throat: Oropharynx is clear and moist.  Cardiovascular: Normal rate and regular rhythm.   Pulmonary/Chest: Effort normal. Tachypnea noted. No respiratory distress. She has wheezes in the right upper field, the right middle field, the right lower field, the left upper field, the left middle field and the left lower field. She has rhonchi in the right lower field and the left lower field.  Abdominal: Soft. Bowel sounds are normal.  Neurological: She is alert and oriented to person, place, and time.  Skin: Skin is warm and dry. Capillary refill takes less than 2 seconds. She is not diaphoretic.  Psychiatric:  She has a normal mood and affect.  Nursing note and vitals reviewed.   Urgent Care Course     Procedures (including critical care time)  Labs Review Labs Reviewed - No data to display  Imaging Review Dg Chest 2 View  Result Date: 06/28/2016 CLINICAL DATA:  Former smoker.  Audible wheezing and rhonchi. EXAM: CHEST  2 VIEW COMPARISON:  07/07/2012 chest radiograph. FINDINGS: Surgical clips overlie the right axilla and lateral right breast. Stable cardiomediastinal silhouette with normal heart size and aortic atherosclerosis. No pneumothorax. No pleural effusion. No pulmonary edema. No acute consolidative airspace disease. Curvilinear opacities at the anterior lung base on the lateral view are most consistent with mild scarring or atelectasis. Cholecystectomy clips are seen in the right upper quadrant of the abdomen. IMPRESSION: 1. Mild scarring versus atelectasis at the anterior lung bases. Otherwise no active cardiopulmonary disease. 2. Aortic atherosclerosis. Electronically Signed   By: Ilona Sorrel M.D.   On: 06/28/2016 16:32     Visual Acuity Review  Right Eye  Distance:   Left Eye Distance:   Bilateral Distance:    Right Eye Near:   Left Eye Near:    Bilateral Near:         MDM   1. Bronchitis   You are being treated today for bronchitis. While here in clinic you received a Duoneb treatment of Albuterol and Atrovent along with an injection of Dexamethasone. There were no signs of pneumonia on your chest XRays and the imaging was consistent with your previous chest X ray in 2014. I have sent a prescription to your pharmacy of Azithromycin, take 2 tablets today then decrease to 1 each day till finished, Prednisone take 6 tablets today then decrease by 1 each day till finished, and Tessalon, take 1 tablet every 8 hours for cough.  I would like you to schedule an appointment with your primary care provider in 1 week to ensure you are well. Should your symptoms worsen or fail to improve follow up with your primary care provider or return to clinic.     Barnet Glasgow, NP 06/28/16 1659

## 2016-06-28 NOTE — ED Triage Notes (Signed)
Pt c/o cold sx onset: 7 days  Sx include: SOB, fatigue, rib pain, decreased appetite, fevers, dry cough   Taking: delsym w/no relief.   A&O x4... NAD

## 2016-06-28 NOTE — Discharge Instructions (Signed)
You are being treated today for bronchitis. While here in clinic you received a Duoneb treatment of Albuterol and Atrovent along with an injection of Dexamethasone. There were no signs of pneumonia on your chest XRays and the imaging was consistent with your previous chest X ray in 2014. I have sent a prescription to your pharmacy of Azithromycin, take 2 tablets today then decrease to 1 each day till finished, Prednisone take 6 tablets today then decrease by 1 each day till finished, and Tessalon, take 1 tablet every 8 hours for cough.  I would like you to schedule an appointment with your primary care provider in 1 week to ensure you are well. Should your symptoms worsen or fail to improve follow up with your primary care provider or return to clinic.

## 2016-07-05 ENCOUNTER — Encounter: Payer: Self-pay | Admitting: Physician Assistant

## 2016-07-05 ENCOUNTER — Ambulatory Visit (INDEPENDENT_AMBULATORY_CARE_PROVIDER_SITE_OTHER): Payer: Medicare Other | Admitting: Physician Assistant

## 2016-07-05 VITALS — BP 120/62 | HR 67 | Temp 97.4°F | Resp 16

## 2016-07-05 DIAGNOSIS — R29898 Other symptoms and signs involving the musculoskeletal system: Secondary | ICD-10-CM | POA: Diagnosis not present

## 2016-07-05 DIAGNOSIS — R296 Repeated falls: Secondary | ICD-10-CM

## 2016-07-05 LAB — CBC WITH DIFFERENTIAL/PLATELET
Basophils Absolute: 0 cells/uL (ref 0–200)
Basophils Relative: 0 %
Eosinophils Absolute: 0 cells/uL — ABNORMAL LOW (ref 15–500)
Eosinophils Relative: 0 %
HCT: 41.3 % (ref 35.0–45.0)
Hemoglobin: 13.7 g/dL (ref 12.0–15.0)
Lymphocytes Relative: 21 %
Lymphs Abs: 3003 cells/uL (ref 850–3900)
MCH: 31.6 pg (ref 27.0–33.0)
MCHC: 33.2 g/dL (ref 32.0–36.0)
MCV: 95.4 fL (ref 80.0–100.0)
MPV: 9.9 fL (ref 7.5–12.5)
Monocytes Absolute: 1287 cells/uL — ABNORMAL HIGH (ref 200–950)
Monocytes Relative: 9 %
Neutro Abs: 10010 cells/uL — ABNORMAL HIGH (ref 1500–7800)
Neutrophils Relative %: 70 %
Platelets: 301 10*3/uL (ref 140–400)
RBC: 4.33 MIL/uL (ref 3.80–5.10)
RDW: 15 % (ref 11.0–15.0)
WBC: 14.3 10*3/uL — ABNORMAL HIGH (ref 3.8–10.8)

## 2016-07-05 LAB — TSH: TSH: 0.41 mIU/L

## 2016-07-05 NOTE — Progress Notes (Signed)
Patient ID: Brittany Kirk MRN: [id number], DOB: 05-16-1946, 71 y.o. Date of Encounter: @DATE @  Chief Complaint:  Chief Complaint  Patient presents with  . Neck Pain  . Dizziness  . numbness in leg    tingling sensation   . Shortness of Breath  . not able to focus    HPI: 71 y.o. year old female  presents with above.   Husband accompanies her for visit today.  I reviewed urgent care note from 06/28/16. Patient and husband today. She has taken the azithromycin and prednisone as directed. Report that she has appointment to follow-up with pulmonary within the upcoming week.  Husband states that he has been noticing these issues going on with her for a long time now but they are getting worse. Says that he "doesn't know if she has Parkinson's or something". Says that "she she walks like a snail and has to hold something when she walks." She adds that she feels staggery and has tremors and weakness. Husband adds taht all of that was going on prior to the bronchitis and has been going on for a while. Asked if any of this has been evaluated in the past. Husband says "she is going to get upset with me for saying this-- but years ago she saw a doctor who told her she had Lewie Bodies and that, through God, he could remove them and healed her. Has had no further evaluation since then and it sounds like that was around 2007.  Husband says that yesterday they were walking into the kitchen to fix some lunch and all of a sudden her legs just go out from under her and she had to hold to the counter top. Husband says that when this happens she does not look like she is passing out. Says "it seems like there is a disconnect between her brain and her muscles" and all of a sudden her muscles just give way. Says that this happens from time to time and has been going on for quite a while (years) but is getting worse.      Past Medical History:  Diagnosis Date  . Allergic rhinitis   . Asymptomatic  bilateral carotid artery stenosis   . CAD (coronary artery disease)   . Cancer ([place])   . Complication of anesthesia    sore throat after a surgery  . Depression   . Emphysema    no longer uses inhalers  . Gallstones    nausea, pain upper right abdomen  . GERD (gastroesophageal reflux disease)   . H/O hiatal hernia   . History of skin cancer   . Hyperlipidemia   . Hypertension   . Shortness of breath    sob if walks up flight of stairs  . Tobacco user   . Wears dentures    top     Home Meds: Outpatient Medications Prior to Visit  Medication Sig Dispense Refill  . albuterol (PROVENTIL HFA;VENTOLIN HFA) 108 (90 Base) MCG/ACT inhaler Inhale 2 puffs into the lungs every 6 (six) hours as needed for wheezing or shortness of breath. 1 Inhaler 2  . anastrozole (ARIMIDEX) 1 MG tablet Take 1 tablet (1 mg total) by mouth daily. 90 tablet 4  . anastrozole (ARIMIDEX) 1 MG tablet TAKE 1 TABLET EVERY DAY 90 tablet 3  . aspirin 81 MG tablet Take 81 mg by mouth daily.    Marland Kitchen atorvastatin (LIPITOR) 40 MG tablet Take 1 tablet (40 mg total) by mouth daily. [place]  tablet 3  . azithromycin (ZITHROMAX) 250 MG tablet Take 1 tablet (250 mg total) by mouth daily. Take first 2 tablets together, then 1 every day until finished. 6 tablet 0  . benzonatate (TESSALON) 100 MG capsule Take 1 capsule (100 mg total) by mouth every 8 (eight) hours. 21 capsule 0  . Cholecalciferol (VITAMIN D3) 5000 UNITS CAPS Take 5,000 Units by mouth daily.    . cyanocobalamin 2000 MCG tablet Take 2,000 mcg by mouth daily. B12 slow release tablet daily    . diazepam (VALIUM) 5 MG tablet TAKE 1 TABLET BY MOUTH AT BEDTIME AS NEEDED FOR ANXIETY  3  . escitalopram (LEXAPRO) 20 MG tablet Take 20 mg by mouth daily.    Marland Kitchen esomeprazole (NEXIUM) 40 MG capsule Take 40 mg by mouth daily at 12 noon.    . furosemide (LASIX) 20 MG tablet Take 1 tablet (20 mg total) by mouth daily. 30 tablet 3  . glucose-Vitamin C 4-0.006 GM CHEW chewable tablet Chew 1  tablet by mouth.    Marland Kitchen HYDROcodone-acetaminophen (NORCO) 5-325 MG tablet Take 1 tablet by mouth every 6 (six) hours as needed for moderate pain. 30 tablet 0  . losartan (COZAAR) 50 MG tablet TAKE 1 TABLET EVERY DAY 90 tablet 3  . metoprolol succinate (TOPROL-XL) 25 MG 24 hr tablet Take 12.5 mg by mouth daily.    . Multiple Vitamin (MULTIVITAMIN) tablet Take 1 tablet by mouth daily.    . predniSONE (STERAPRED UNI-PAK 21 TAB) 10 MG (21) TBPK tablet Take 6 tablets today, then decrease by 1 each day till finished (6,5,4,3,2,1) 21 tablet 0  . vitamin E 200 UNIT capsule Take 200 Units by mouth daily.     No facility-administered medications prior to visit.     Allergies:  Allergies  Allergen Reactions  . Latex   . Cefaclor Itching, Swelling and Rash  . Metronidazole Itching, Swelling and Rash  . Naproxen Itching, Swelling and Rash  . Nsaids Itching, Swelling and Rash    Can take advil  . Penicillins Itching, Swelling and Rash  . Septra [Bactrim] Itching, Swelling and Rash  . Sulfonamide Derivatives Itching, Swelling and Rash    Social History   Social History  . Marital status: Married    Spouse name: N/A  . Number of children: 1  . Years of education: N/A   Occupational History  . 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  Retired   Social History Main Topics  . Smoking status: Former Smoker    Packs/day: 0.50    Years: 40.00    Types: Cigarettes    Quit date: 02/13/2011  . Smokeless tobacco: Never Used  . Alcohol use No  . Drug use: No  . Sexual activity: Not on file   Other Topics Concern  . Not on file   Social History Narrative  . No narrative on file    Family History  Problem Relation Age of Onset  . Arthritis Mother   . Heart disease      5 brothers and 2 sister  . Diverticulitis Sister      Review of Systems:  See HPI for pertinent ROS. All other ROS negative.    Physical Exam: Blood pressure 120/62, pulse 67, temperature 97.4 F (36.3 C), temperature source  Oral, resp. rate 16, SpO2 91 %., There is no height or weight on file to calculate BMI. General: WF. Appears in no acute distress. Neck: Supple. No thyromegaly. No lymphadenopathy. Lungs: Clear bilaterally to  auscultation without wheezes, rales, or rhonchi. Breathing is unlabored. Heart: RRR with S1 S2. No murmurs, rubs, or gallops. Musculoskeletal:  Strength and tone normal for age. Extremities/Skin: Warm and dry.  Neuro: Alert and oriented X 3. Moves all extremities spontaneously.  CNII-XII grossly in tact. 5/5 strength against resistance for bilateral upper extremities and bilateral lower extremities. Psych:  Responds to questions appropriately with a normal affect.     ASSESSMENT AND PLAN:  71 y.o. year old female with  1. Frequent falls Obtain the following labs. Refer to neurology for for further evaluation. - CBC with Differential/Platelet - COMPLETE METABOLIC PANEL WITH GFR - TSH - Magnesium - Ambulatory referral to Neurology  2. Weakness of extremity Obtain the following labs. Refer to neurology for for further evaluation. - CBC with Differential/Platelet - COMPLETE METABOLIC PANEL WITH GFR - TSH - Magnesium - Ambulatory referral to Neurology   Signed, Karis Juba, [place], [facility] 07/05/2016 1:13 PM

## 2016-07-06 ENCOUNTER — Encounter (HOSPITAL_COMMUNITY): Payer: Self-pay | Admitting: Nurse Practitioner

## 2016-07-06 ENCOUNTER — Ambulatory Visit (HOSPITAL_COMMUNITY)
Admission: EM | Admit: 2016-07-06 | Discharge: 2016-07-06 | Disposition: A | Discharge status: Nursing Home (Includes ICF) | Payer: Medicare Other

## 2016-07-06 ENCOUNTER — Emergency Department (HOSPITAL_COMMUNITY): Payer: Medicare Other

## 2016-07-06 ENCOUNTER — Emergency Department (HOSPITAL_COMMUNITY)
Admission: EM | Admit: 2016-07-06 | Discharge: 2016-07-06 | Disposition: A | Discharge status: Home / Self Care | Payer: Medicare Other | Attending: Emergency Medicine | Admitting: Emergency Medicine

## 2016-07-06 DIAGNOSIS — I129 Hypertensive chronic kidney disease with stage 1 through stage 4 chronic kidney disease, or unspecified chronic kidney disease: Secondary | ICD-10-CM | POA: Insufficient documentation

## 2016-07-06 DIAGNOSIS — R42 Dizziness and giddiness: Secondary | ICD-10-CM | POA: Insufficient documentation

## 2016-07-06 DIAGNOSIS — R531 Weakness: Secondary | ICD-10-CM | POA: Diagnosis not present

## 2016-07-06 DIAGNOSIS — Z85828 Personal history of other malignant neoplasm of skin: Secondary | ICD-10-CM | POA: Diagnosis not present

## 2016-07-06 DIAGNOSIS — I2510 Atherosclerotic heart disease of native coronary artery without angina pectoris: Secondary | ICD-10-CM | POA: Insufficient documentation

## 2016-07-06 DIAGNOSIS — Z853 Personal history of malignant neoplasm of breast: Secondary | ICD-10-CM | POA: Diagnosis not present

## 2016-07-06 DIAGNOSIS — Z87891 Personal history of nicotine dependence: Secondary | ICD-10-CM | POA: Insufficient documentation

## 2016-07-06 DIAGNOSIS — Z7982 Long term (current) use of aspirin: Secondary | ICD-10-CM | POA: Insufficient documentation

## 2016-07-06 DIAGNOSIS — N183 Chronic kidney disease, stage 3 (moderate): Secondary | ICD-10-CM | POA: Insufficient documentation

## 2016-07-06 DIAGNOSIS — Z5181 Encounter for therapeutic drug level monitoring: Secondary | ICD-10-CM | POA: Diagnosis not present

## 2016-07-06 DIAGNOSIS — R202 Paresthesia of skin: Secondary | ICD-10-CM | POA: Insufficient documentation

## 2016-07-06 DIAGNOSIS — Z91040 Latex allergy status: Secondary | ICD-10-CM | POA: Insufficient documentation

## 2016-07-06 LAB — URINALYSIS, ROUTINE W REFLEX MICROSCOPIC
Bilirubin Urine: NEGATIVE
Glucose, UA: NEGATIVE mg/dL
Hgb urine dipstick: NEGATIVE
Ketones, ur: NEGATIVE mg/dL
Leukocytes, UA: NEGATIVE
Nitrite: NEGATIVE
Protein, ur: NEGATIVE mg/dL
Specific Gravity, Urine: 1.026 (ref 1.005–1.030)
pH: 5 (ref 5.0–8.0)

## 2016-07-06 LAB — I-STAT CHEM 8, ED
BUN: 27 mg/dL — ABNORMAL HIGH (ref 6–20)
Calcium, Ion: 1.13 mmol/L — ABNORMAL LOW (ref 1.15–1.40)
Chloride: 101 mmol/L (ref 101–111)
Creatinine, Ser: 1.3 mg/dL — ABNORMAL HIGH (ref 0.44–1.00)
Glucose, Bld: 115 mg/dL — ABNORMAL HIGH (ref 65–99)
HCT: 39 % (ref 36.0–46.0)
Hemoglobin: 13.3 g/dL (ref 12.0–15.0)
Potassium: 3.8 mmol/L (ref 3.5–5.1)
Sodium: 139 mmol/L (ref 135–145)
TCO2: 27 mmol/L (ref 0–100)

## 2016-07-06 LAB — COMPREHENSIVE METABOLIC PANEL
ALT: 17 U/L (ref 14–54)
AST: 16 U/L (ref 15–41)
Albumin: 3 g/dL — ABNORMAL LOW (ref 3.5–5.0)
Alkaline Phosphatase: 68 U/L (ref 38–126)
Anion gap: 7 (ref 5–15)
BUN: 26 mg/dL — ABNORMAL HIGH (ref 6–20)
CO2: 26 mmol/L (ref 22–32)
Calcium: 8.7 mg/dL — ABNORMAL LOW (ref 8.9–10.3)
Chloride: 105 mmol/L (ref 101–111)
Creatinine, Ser: 1.33 mg/dL — ABNORMAL HIGH (ref 0.44–1.00)
GFR calc Af Amer: 45 mL/min — ABNORMAL LOW (ref >60)
GFR calc non Af Amer: 39 mL/min — ABNORMAL LOW (ref >60)
Glucose, Bld: 116 mg/dL — ABNORMAL HIGH (ref 65–99)
Potassium: 3.8 mmol/L (ref 3.5–5.1)
Sodium: 138 mmol/L (ref 135–145)
Total Bilirubin: 0.6 mg/dL (ref 0.3–1.2)
Total Protein: 5.4 g/dL — ABNORMAL LOW (ref 6.5–8.1)

## 2016-07-06 LAB — CBC
HCT: 40.8 % (ref 36.0–46.0)
Hemoglobin: 13.3 g/dL (ref 12.0–15.0)
MCH: 31.5 pg (ref 26.0–34.0)
MCHC: 32.6 g/dL (ref 30.0–36.0)
MCV: 96.7 fL (ref 78.0–100.0)
Platelets: 279 10*3/uL (ref 150–400)
RBC: 4.22 MIL/uL (ref 3.87–5.11)
RDW: 15 % (ref 11.5–15.5)
WBC: 13.5 10*3/uL — ABNORMAL HIGH (ref 4.0–10.5)

## 2016-07-06 LAB — COMPLETE METABOLIC PANEL WITH GFR
ALT: 16 U/L (ref 6–29)
AST: 14 U/L (ref 10–35)
Albumin: 3.1 g/dL — ABNORMAL LOW (ref 3.6–5.1)
Alkaline Phosphatase: 72 U/L (ref 33–130)
BUN: 29 mg/dL — ABNORMAL HIGH (ref 7–25)
CO2: 24 mmol/L (ref 20–31)
Calcium: 8.8 mg/dL (ref 8.6–10.4)
Chloride: 102 mmol/L (ref 98–110)
Creat: 1.27 mg/dL — ABNORMAL HIGH (ref 0.60–0.93)
GFR, Est African American: 49 mL/min — ABNORMAL LOW (ref >=60)
GFR, Est Non African American: 42 mL/min — ABNORMAL LOW (ref >=60)
Glucose, Bld: 91 mg/dL (ref 70–99)
Potassium: 4.2 mmol/L (ref 3.5–5.3)
Sodium: 140 mmol/L (ref 135–146)
Total Bilirubin: 0.9 mg/dL (ref 0.2–1.2)
Total Protein: 5.8 g/dL — ABNORMAL LOW (ref 6.1–8.1)

## 2016-07-06 LAB — DIFFERENTIAL
Basophils Absolute: 0 10*3/uL (ref 0.0–0.1)
Basophils Relative: 0 %
Eosinophils Absolute: 0.1 10*3/uL (ref 0.0–0.7)
Eosinophils Relative: 0 %
Lymphocytes Relative: 24 %
Lymphs Abs: 3.2 10*3/uL (ref 0.7–4.0)
Monocytes Absolute: 0.9 10*3/uL (ref 0.1–1.0)
Monocytes Relative: 6 %
Neutro Abs: 9.4 10*3/uL — ABNORMAL HIGH (ref 1.7–7.7)
Neutrophils Relative %: 70 %

## 2016-07-06 LAB — MAGNESIUM: Magnesium: 2.3 mg/dL (ref 1.5–2.5)

## 2016-07-06 LAB — I-STAT TROPONIN, ED: Troponin i, poc: 0 ng/mL (ref 0.00–0.08)

## 2016-07-06 LAB — PROTIME-INR
INR: 1.11
Prothrombin Time: 14.4 seconds (ref 11.4–15.2)

## 2016-07-06 LAB — APTT: aPTT: 25 seconds (ref 24–36)

## 2016-07-06 MED ORDER — SODIUM CHLORIDE 0.9 % IV BOLUS (SEPSIS)
1000.0000 mL | Freq: Once | INTRAVENOUS | Status: AC
  Administered 2016-07-06: 1000 mL via INTRAVENOUS

## 2016-07-06 NOTE — ED Notes (Signed)
ED Provider at bedside. 

## 2016-07-06 NOTE — ED Notes (Addendum)
Dr  Joseph Art  evaulated  Pt  Send  To the   [facility]

## 2016-07-06 NOTE — ED Triage Notes (Signed)
Pt presents with c/o dizziness. The dizziness began last week after she had bronchitis. The dizziness has become progressively worse since onset. She c/o intermittent headaches, numbness and tingling in her entire body, trouble walking. She went to her PCP for these symptoms yesterday and referred her to neurology but the neurologist can not see her until 2/14 so she decided to come to ed for evaluation today

## 2016-07-06 NOTE — ED Provider Notes (Signed)
[facility] DEPT Provider Note   CSN: [id number] Arrival date & time: 07/06/16 1331     History    Chief Complaint  Patient presents with  . Dizziness     HPI Brittany Kirk is a 71 y.o. female.  71yo F w/ PMH including CAD, emphysema, GERD, HTN, HLD who p/w multiple complaints including dizziness, weakness, and tingling. The patient recently developed URI symptoms and was diagnosed with bronchitis, given azithromycin and steroids. These symptoms have improved but she states that since the bronchitis, her dizziness has worsened. She saw her PCP yesterday for some symptoms that have been chronic but have been worse recently. She reports intermittent tremors and generalized weakness particularly when walking. She reports balance problems when walking and problems with proprioception. She endorses some lightheadedness, no room spinning sensation. She also endorses intermittent headaches. No visual changes. She occasionally has numbness and tingling of her entire body particularly after she has been sitting for a while. Her PCP note from yesterday, husband stated "it seems like there is a disconnect between her brain and her muscles." All of these symptoms have been going on for months to years but have been worse recently. She was referred to neurology but her appt isn't until 2/14 and because the symptoms are worsening they decided to come in today.   She denies any problems with incontinence, fevers. She endorses mild persistent cough and some diarrhea.  Past Medical History:  Diagnosis Date  . Allergic rhinitis   . Asymptomatic bilateral carotid artery stenosis   . CAD (coronary artery disease)   . Cancer ([place])   . Complication of anesthesia    sore throat after a surgery  . Depression   . Emphysema    no longer uses inhalers  . Gallstones    nausea, pain upper right abdomen  . GERD (gastroesophageal reflux disease)   . H/O hiatal hernia   . History of skin cancer   .  Hyperlipidemia   . Hypertension   . Shortness of breath    sob if walks up flight of stairs  . Tobacco user   . Wears dentures    top     Patient Active Problem List   Diagnosis Date Noted  . Bilateral carotid artery disease ([place]) 02/14/2016  . Chest pain 01/13/2016  . Osteoporosis 02/22/2015  . CKD (chronic kidney disease), stage III 08/16/2014  . Breast cancer of upper-outer quadrant of right female breast ([place]) 03/22/2014  . Dizziness and giddiness 11/27/2012  . Essential tremor 11/27/2012  . Numbness 11/27/2012  . TOBACCO USER 10/04/2009  . CLOSED FRACTURE OF ONE RIB 07/12/2009  . HYPERLIPIDEMIA TYPE IIB / III 09/28/2008  . CAD, NATIVE VESSEL 09/28/2008  . Rhinitis 09/09/2008  . EMPHYSEMA 09/07/2008  . Essential hypertension 09/06/2008  . SKIN CANCER, HX OF 09/06/2008    Past Surgical History:  Procedure Laterality Date  . CHOLECYSTECTOMY  07/08/2012   Procedure: LAPAROSCOPIC CHOLECYSTECTOMY WITH INTRAOPERATIVE CHOLANGIOGRAM;  Surgeon: Gayland Curry, MD,FACS;  Location: [facility];  Service: General;  Laterality: N/A;  Laparoscopic Cholecystectomy with Intraoperative Cholangiogram  . COLONOSCOPY    . TONSILECTOMY, ADENOIDECTOMY, BILATERAL MYRINGOTOMY AND TUBES    . TUBAL LIGATION      OB History    No data available        Home Medications    Prior to Admission medications   Medication Sig Start Date End Date Taking? Authorizing Provider  albuterol (PROVENTIL HFA;VENTOLIN HFA) 108 (90 Base) MCG/ACT inhaler Inhale 2  puffs into the lungs every 6 (six) hours as needed for wheezing or shortness of breath. 03/14/16  Yes Mary B Dixon, PA-C  anastrozole (ARIMIDEX) 1 MG tablet Take 1 tablet (1 mg total) by mouth daily. 02/23/16  Yes Chauncey Cruel, MD  aspirin 81 MG tablet Take 81 mg by mouth daily.   Yes Historical Provider, MD  atorvastatin (LIPITOR) 40 MG tablet Take 1 tablet (40 mg total) by mouth daily. 02/22/16 07/06/16 Yes Susy Frizzle, MD  benzonatate  (TESSALON) 100 MG capsule Take 1 capsule (100 mg total) by mouth every 8 (eight) hours. 06/28/16  Yes Barnet Glasgow, NP  Cholecalciferol (VITAMIN D3) 5000 UNITS CAPS Take 5,000 Units by mouth daily.   Yes Historical Provider, MD  cyanocobalamin 2000 MCG tablet Take 2,000 mcg by mouth daily. B12 slow release tablet daily   Yes Historical Provider, MD  diazepam (VALIUM) 5 MG tablet TAKE 1 TABLET BY MOUTH AT BEDTIME AS NEEDED FOR ANXIETY 07/19/15  Yes Historical Provider, MD  escitalopram (LEXAPRO) 20 MG tablet Take 20 mg by mouth daily.   Yes Historical Provider, MD  esomeprazole (NEXIUM) 40 MG capsule Take 40 mg by mouth daily at 12 noon.   Yes Historical Provider, MD  HYDROcodone-acetaminophen (NORCO) 5-325 MG tablet Take 1 tablet by mouth every 6 (six) hours as needed for moderate pain. 11/21/15  Yes Susy Frizzle, MD  losartan (COZAAR) 50 MG tablet TAKE 1 TABLET EVERY DAY 06/18/16  Yes Susy Frizzle, MD  Multiple Vitamin (MULTIVITAMIN) tablet Take 1 tablet by mouth daily.   Yes Historical Provider, MD  vitamin E 200 UNIT capsule Take 200 Units by mouth daily.   Yes Historical Provider, MD  Vitamin Mixture (VITAMIN C) LIQD Take 2 sprays by mouth daily.   Yes Historical Provider, MD  anastrozole (ARIMIDEX) 1 MG tablet TAKE 1 TABLET EVERY DAY Patient not taking: Reported on 07/06/2016 04/20/16   Chauncey Cruel, MD  azithromycin (ZITHROMAX) 250 MG tablet Take 1 tablet (250 mg total) by mouth daily. Take first 2 tablets together, then 1 every day until finished. Patient not taking: Reported on 07/06/2016 06/28/16   Barnet Glasgow, NP  furosemide (LASIX) 20 MG tablet Take 1 tablet (20 mg total) by mouth daily. Patient not taking: Reported on 07/06/2016 03/08/16   Susy Frizzle, MD  predniSONE (STERAPRED UNI-PAK 21 TAB) 10 MG (21) TBPK tablet Take 6 tablets today, then decrease by 1 each day till finished (6,5,4,3,2,1) Patient not taking: Reported on 07/06/2016 06/28/16   Barnet Glasgow, NP       Family History  Problem Relation Age of Onset  . Arthritis Mother   . Heart disease      5 brothers and 2 sister  . Diverticulitis Sister      Social History  Substance Use Topics  . Smoking status: Former Smoker    Packs/day: 0.50    Years: 40.00    Types: Cigarettes    Quit date: 02/13/2011  . Smokeless tobacco: Never Used  . Alcohol use No     Allergies     Penicillins; Cefaclor; Latex; Metronidazole; Naproxen; Nsaids; Septra [bactrim]; Sulfonamide derivatives; and Tape    Review of Systems  10 Systems reviewed and are negative for acute change except as noted in the HPI.   Physical Exam Updated Vital Signs BP 154/62   Pulse 62   Temp 97.8 F (36.6 C) (Oral)   Resp 20   SpO2 100%   Physical Exam  Constitutional:  She is oriented to person, place, and time. She appears well-developed and well-nourished. No distress.  Awake, alert  HENT:  Head: Normocephalic and atraumatic.  Eyes: Conjunctivae and EOM are normal. Pupils are equal, round, and reactive to light.  Neck: Neck supple.  Cardiovascular: Normal rate, regular rhythm and normal heart sounds.   No murmur heard. Pulmonary/Chest: Effort normal and breath sounds normal. No respiratory distress.  Abdominal: Soft. Bowel sounds are normal. She exhibits no distension. There is no tenderness.  Musculoskeletal: She exhibits no edema.  Neurological: She is alert and oriented to person, place, and time. She has normal reflexes. No cranial nerve deficit. She exhibits normal muscle tone.  Fluent speech, normal finger-to-nose testing, negative pronator drift, no clonus, no tremor Normal heel-to-shin b/l 5/5 strength and normal sensation x all 4 extremities  Skin: Skin is warm and dry.  Psychiatric: She has a normal mood and affect. Judgment and thought content normal.  Nursing note and vitals reviewed.     ED Treatments / Results  Labs (all labs ordered are listed, but only abnormal results are  displayed) Labs Reviewed  CBC - Abnormal; Notable for the following:       Result Value   WBC 13.5 (*)    All other components within normal limits  DIFFERENTIAL - Abnormal; Notable for the following:    Neutro Abs 9.4 (*)    All other components within normal limits  COMPREHENSIVE METABOLIC PANEL - Abnormal; Notable for the following:    Glucose, Bld 116 (*)    BUN 26 (*)    Creatinine, Ser 1.33 (*)    Calcium 8.7 (*)    Total Protein 5.4 (*)    Albumin 3.0 (*)    GFR calc non Af Amer 39 (*)    GFR calc Af Amer 45 (*)    All other components within normal limits  URINALYSIS, ROUTINE W REFLEX MICROSCOPIC - Abnormal; Notable for the following:    Color, Urine AMBER (*)    APPearance HAZY (*)    All other components within normal limits  I-STAT CHEM 8, ED - Abnormal; Notable for the following:    BUN 27 (*)    Creatinine, Ser 1.30 (*)    Glucose, Bld 115 (*)    Calcium, Ion 1.13 (*)    All other components within normal limits  PROTIME-INR  APTT  I-STAT TROPOININ, ED     EKG  EKG Interpretation  Date/Time:  Friday July 06 2016 13:57:44 EST Ventricular Rate:  63 PR Interval:  116 QRS Duration: 90 QT Interval:  458 QTC Calculation: 468 R Axis:   23 Text Interpretation:  Normal sinus rhythm Cannot rule out Anterior infarct , age undetermined Abnormal ECG No significant change since last tracing Confirmed by Adarrius Graeff MD, Lucrecia Mcphearson [phone]) on 07/06/2016 3:14:00 PM         Radiology Ct Head Wo Contrast  Result Date: 07/06/2016 CLINICAL DATA:  Dizziness EXAM: CT HEAD WITHOUT CONTRAST TECHNIQUE: Contiguous axial images were obtained from the base of the skull through the vertex without intravenous contrast. COMPARISON:  None. FINDINGS: Brain: Mild atrophic changes are noted. No findings to suggest acute hemorrhage, acute infarction or space-occupying mass lesion are noted. Vascular: No hyperdense vessel or unexpected calcification. Skull: Normal. Negative for fracture or  focal lesion. Sinuses/Orbits: No acute finding. Other: None. IMPRESSION: Chronic atrophic changes without acute abnormality. Electronically Signed   By: Inez Catalina M.D.   On: 07/06/2016 14:29    Procedures Procedures (including  critical care time) Procedures  Medications Ordered in ED  Medications  sodium chloride 0.9 % bolus 1,000 mL (0 mLs Intravenous Stopped 07/06/16 2049)     Initial Impression / Assessment and Plan / ED Course  I have reviewed the triage vital signs and the nursing notes.  Pertinent labs & imaging results that were available during my care of the patient were reviewed by me and considered in my medical decision making (see chart for details).  Patient presents with multiple symptoms including generalized weakness, intermittent whole body tingling, lightheadedness, feeling off balance when walking. She has already been evaluated by PCP yesterday and referred to neurology but she and husband came here because they felt like symptoms were worse. She was awake and alert, comfortable on exam with reassuring vital signs. She denied any complaints of pain. Normal neurologic exam. She was noted to be ambulatory by nursing on arrival. Obtained above lab work which was reassuring. She did have positive orthostatic vital signs although she is been eating and drinking normally. Gave 1 L of IV fluids which increased her blood pressure. Obtained head CT which was negative for acute process. After discussing symptoms extensively with the patient and her husband and reviewing recent notes in her chart describing her symptoms, I feel that her nonspecific symptoms may represent a chronic neurologic process. No signs or symptoms to suggest acute infection or cardiac etiology of her symptoms. She has an exam that is reassuring against acute stroke. I have emphasized the importance of following closely with her PCP and I have made an ambulatory referral to [facility] neurology. Instructed to drink  fluids aggressively at home and take half of her normal dose of losartan until she contacts her PCP for further instructions. Discussed return precautions. She and husband in agreement with plan and patient discharged in satisfactory condition.  Final Clinical Impressions(s) / ED Diagnoses   Final diagnoses:  Dizziness  Tingling  Generalized weakness     Discharge Medication List as of 07/06/2016  8:31 PM         Sharlett Iles, MD 07/07/16 1551

## 2016-07-06 NOTE — Discharge Instructions (Signed)
Take 1/2 (25mg ) of your normal dose of losartan and drink plenty of fluids. Contact your primary care doctor on Monday for follow up appointment to discuss your blood pressure. Follow up with [facility] neurology as soon as possible.

## 2016-07-10 ENCOUNTER — Telehealth: Payer: Self-pay

## 2016-07-10 NOTE — Telephone Encounter (Signed)
Called pt to provide lab results. Pt states since her last visit  she had to go to an urgent care and they diagnosed her with double bronchitis. Also she went to the hospital on 1-26 and they gave her IV as well as cut her losartan from 50 mg to 25mg 

## 2016-07-10 NOTE — Telephone Encounter (Signed)
Pt has an appt with NEUR on 2-14

## 2016-07-12 ENCOUNTER — Ambulatory Visit (INDEPENDENT_AMBULATORY_CARE_PROVIDER_SITE_OTHER): Payer: Medicare Other | Admitting: Emergency Medicine

## 2016-07-12 ENCOUNTER — Encounter: Payer: Self-pay | Admitting: Emergency Medicine

## 2016-07-12 DIAGNOSIS — J301 Allergic rhinitis due to pollen: Secondary | ICD-10-CM | POA: Diagnosis not present

## 2016-07-12 DIAGNOSIS — J438 Other emphysema: Secondary | ICD-10-CM | POA: Diagnosis not present

## 2016-07-12 DIAGNOSIS — J449 Chronic obstructive pulmonary disease, unspecified: Secondary | ICD-10-CM

## 2016-07-12 DIAGNOSIS — J209 Acute bronchitis, unspecified: Secondary | ICD-10-CM | POA: Diagnosis not present

## 2016-07-12 LAB — PULMONARY FUNCTION TEST
DL/VA % pred: 100 %
DL/VA: 4.41 ml/min/mmHg/L
DLCO cor % pred: 75 %
DLCO cor: 15.36 ml/min/mmHg
DLCO unc % pred: 72 %
DLCO unc: 14.76 ml/min/mmHg
FEF 25-75 Post: 1.14 L/sec
FEF 25-75 Pre: 0.96 L/sec
FEF2575-%Change-Post: 18 %
FEF2575-%Pred-Post: 70 %
FEF2575-%Pred-Pre: 59 %
FEV1-%Change-Post: 3 %
FEV1-%Pred-Post: 65 %
FEV1-%Pred-Pre: 63 %
FEV1-Post: 1.26 L
FEV1-Pre: 1.21 L
FEV1FVC-%Change-Post: -4 %
FEV1FVC-%Pred-Pre: 102 %
FEV6-%Change-Post: 8 %
FEV6-%Pred-Post: 70 %
FEV6-%Pred-Pre: 65 %
FEV6-Post: 1.71 L
FEV6-Pre: 1.57 L
FEV6FVC-%Change-Post: 0 %
FEV6FVC-%Pred-Post: 104 %
FEV6FVC-%Pred-Pre: 105 %
FVC-%Change-Post: 8 %
FVC-%Pred-Post: 67 %
FVC-%Pred-Pre: 61 %
FVC-Post: 1.71 L
FVC-Pre: 1.57 L
Post FEV1/FVC ratio: 73 %
Post FEV6/FVC ratio: 100 %
Pre FEV1/FVC ratio: 77 %
Pre FEV6/FVC Ratio: 100 %
RV % pred: 97 %
RV: 2.05 L
TLC % pred: 78 %
TLC: 3.62 L

## 2016-07-12 MED ORDER — DOXYCYCLINE HYCLATE 100 MG PO TABS: 100.0000 mg | ORAL_TABLET | Freq: Two times a day (BID) | ORAL | 0 refills | Status: AC

## 2016-07-12 MED ORDER — TIOTROPIUM BROMIDE MONOHYDRATE 1.25 MCG/ACT IN AERS: 2.0000 | INHALATION_SPRAY | Freq: Every day | RESPIRATORY_TRACT | 0 refills | Status: DC

## 2016-07-12 NOTE — Assessment & Plan Note (Signed)
Symptoms consistent with continued purulent bronchitis. She was treated with azithromycin but I would like to treat her further with doxycycline to see if this resolves. Treat her rhinitis more effectively.

## 2016-07-12 NOTE — Progress Notes (Signed)
Subjective:    Patient ID: Brittany Kirk, female    DOB: 1946/04/21, 71 y.o.   MRN: [id number]  HPI 71 year old former smoker with a history of COPD, HTN, coronary artery disease, allergic rhinitis, hiatal hernia with GERD, breast CA s/p R lumpectomy + anastrozole. Has been followed by Dr Joya Gaskins before for her COPD. Also had some stable pulm nodules that were followed by CT chest, last was reassuring in 11/14/10.   She returns today reporting that she has experienced continuous cough since about 1 month ago. She was treated for an acute bronchitis about 6 weeks ago, improved for a while, but then developed sneezing, congestion and cough that is occasionally productive of clear mucous. She was started on a sample of symbicort a month ago, though it might have helped her. She has tried flonase without improvement, allegra, now off of these.   ROV 07/12/16 -- This follow-up visit for chronic cough setting of known COPD, allergic rhinitis, hiatal hernia with GERD. He was treated for an acute bronchitis following what sounded like a viral upper respiratory infection 06/28/16. She was given for mycin and prednisone. She then returned to the emergency department 07/06/16 with dizziness, weakness. There was no clear etiology. She's been referred to [facility] neurology. We did PFT today that I have reviewed >> spirometry shows mixed obstruction and restriction without a bronchodilator response, total lung capacity is restricted, the diffusion capacity is decreased but corrects to the normal range when adjusted for alveolar volume. FEV1 1.21 L (63% predicted). She is feeling better now, but still coughing, has some yellow mucous. She is on nexium qd, can sometimes have breakthru sx. She is not on any allergy meds currently. She uses albuterol occasionally, seems to help her breathing.    Review of Systems  Constitutional: Negative for fever and unexpected weight change.  HENT: Positive for congestion and sneezing.  Negative for dental problem, ear pain, nosebleeds, postnasal drip, rhinorrhea, sinus pressure, sore throat and trouble swallowing.   Eyes: Negative for redness and itching.  Respiratory: Positive for cough and shortness of breath. Negative for chest tightness and wheezing.   Cardiovascular: Negative for palpitations and leg swelling.  Gastrointestinal: Negative for nausea and vomiting.  Genitourinary: Negative for dysuria.  Musculoskeletal: Negative for joint swelling.  Skin: Negative for rash.  Neurological: Negative for headaches.  Hematological: Does not bruise/bleed easily.  Psychiatric/Behavioral: Negative for dysphoric mood. The patient is nervous/anxious.     Past Medical History:  Diagnosis Date  . Allergic rhinitis   . Asymptomatic bilateral carotid artery stenosis   . CAD (coronary artery disease)   . Cancer ([place])   . Complication of anesthesia    sore throat after a surgery  . Depression   . Emphysema    no longer uses inhalers  . Gallstones    nausea, pain upper right abdomen  . GERD (gastroesophageal reflux disease)   . H/O hiatal hernia   . History of skin cancer   . Hyperlipidemia   . Hypertension   . Shortness of breath    sob if walks up flight of stairs  . Tobacco user   . Wears dentures    top     Family History  Problem Relation Age of Onset  . Arthritis Mother   . Heart disease      5 brothers and 2 sister  . Diverticulitis Sister      Social History   Social History  . Marital status: Married  Spouse name: N/A  . Number of children: 1  . Years of education: N/A   Occupational History  . 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  Retired   Social History Main Topics  . Smoking status: Former Smoker    Packs/day: 0.50    Years: 40.00    Types: Cigarettes    Quit date: 02/13/2011  . Smokeless tobacco: Never Used  . Alcohol use No  . Drug use: No  . Sexual activity: Not on file   Other Topics Concern  . Not on file   Social History  Narrative  . No narrative on file     Allergies  Allergen Reactions  . Penicillins Anaphylaxis, Itching, Swelling and Rash    Has patient had a PCN reaction causing immediate rash, facial/tongue/throat swelling, SOB or lightheadedness with hypotension: Yes Has patient had a PCN reaction causing severe rash involving mucus membranes or skin necrosis: No Has patient had a PCN reaction that required hospitalization: Unk Has patient had a PCN reaction occurring within the last 10 years: No If all of the above answers are "NO", then may proceed with Cephalosporin use.   . Cefaclor Itching, Swelling and Rash  . Latex Itching and Rash  . Metronidazole Itching, Swelling and Rash  . Naproxen Itching, Swelling and Rash  . Nsaids Itching, Swelling and Rash    Can take advil  . Septra [Bactrim] Itching, Swelling and Rash  . Sulfonamide Derivatives Itching, Swelling and Rash  . Tape Itching and Rash     Outpatient Medications Prior to Visit  Medication Sig Dispense Refill  . albuterol (PROVENTIL HFA;VENTOLIN HFA) 108 (90 Base) MCG/ACT inhaler Inhale 2 puffs into the lungs every 6 (six) hours as needed for wheezing or shortness of breath. 1 Inhaler 2  . anastrozole (ARIMIDEX) 1 MG tablet Take 1 tablet (1 mg total) by mouth daily. 90 tablet 4  . anastrozole (ARIMIDEX) 1 MG tablet TAKE 1 TABLET EVERY DAY 90 tablet 3  . aspirin 81 MG tablet Take 81 mg by mouth daily.    . benzonatate (TESSALON) 100 MG capsule Take 1 capsule (100 mg total) by mouth every 8 (eight) hours. 21 capsule 0  . Cholecalciferol (VITAMIN D3) 5000 UNITS CAPS Take 5,000 Units by mouth daily.    . cyanocobalamin 2000 MCG tablet Take 2,000 mcg by mouth daily. B12 slow release tablet daily    . diazepam (VALIUM) 5 MG tablet TAKE 1 TABLET BY MOUTH AT BEDTIME AS NEEDED FOR ANXIETY  3  . escitalopram (LEXAPRO) 20 MG tablet Take 20 mg by mouth daily.    Marland Kitchen esomeprazole (NEXIUM) 40 MG capsule Take 40 mg by mouth daily at 12 noon.    Marland Kitchen  HYDROcodone-acetaminophen (NORCO) 5-325 MG tablet Take 1 tablet by mouth every 6 (six) hours as needed for moderate pain. 30 tablet 0  . losartan (COZAAR) 50 MG tablet TAKE 1 TABLET EVERY DAY (Patient taking differently: Only taking 25mg ) 90 tablet 3  . Multiple Vitamin (MULTIVITAMIN) tablet Take 1 tablet by mouth daily.    . vitamin E 200 UNIT capsule Take 200 Units by mouth daily.    . Vitamin Mixture (VITAMIN C) LIQD Take 2 sprays by mouth daily.    Marland Kitchen atorvastatin (LIPITOR) 40 MG tablet Take 1 tablet (40 mg total) by mouth daily. 90 tablet 3  . azithromycin (ZITHROMAX) 250 MG tablet Take 1 tablet (250 mg total) by mouth daily. Take first 2 tablets together, then 1 every day until finished. (  Patient not taking: Reported on 07/12/2016) 6 tablet 0  . furosemide (LASIX) 20 MG tablet Take 1 tablet (20 mg total) by mouth daily. (Patient not taking: Reported on 07/12/2016) 30 tablet 3  . predniSONE (STERAPRED UNI-PAK 21 TAB) 10 MG (21) TBPK tablet Take 6 tablets today, then decrease by 1 each day till finished (6,5,4,3,2,1) (Patient not taking: Reported on 07/12/2016) 21 tablet 0   No facility-administered medications prior to visit.         Objective:   Physical Exam Vitals:   07/12/16 1335  BP: 110/80  Pulse: 75  SpO2: 98%  Weight: 152 lb 3.2 oz (69 kg)  Height: 5\' 1"  (1.549 m)   Gen: Pleasant, well-nourished, in no distress,  normal affect  ENT: No lesions,  mouth clear,  oropharynx clear, no postnasal drip  Neck: No JVD, no TMG, no carotid bruits  Lungs: No use of accessory muscles,  clear without rales or rhonchi  Cardiovascular: RRR, heart sounds normal, no murmur or gallops, no peripheral edema  Musculoskeletal: No deformities, no cyanosis or clubbing  Neuro: alert, non focal  Skin: Warm, no lesions or rashes      Assessment & Plan:  EMPHYSEMA Obstructive disease confirmed on pulmonary function testing from today. I would like to do a trial of Spiriva Respimat to see if she  benefits. We will discuss next time her improvement and decide whether to continue this. She will use albuterol as needed.  Rhinitis Restart loratadine 10 mg daily  Acute bronchitis Symptoms consistent with continued purulent bronchitis. She was treated with azithromycin but I would like to treat her further with doxycycline to see if this resolves. Treat her rhinitis more effectively.   Baltazar Apo, MD, PhD 07/12/2016, 1:55 PM [facility] [phone] or if no answer [phone] .Marland Kitchen/.

## 2016-07-12 NOTE — Assessment & Plan Note (Signed)
Obstructive disease confirmed on pulmonary function testing from today. I would like to do a trial of Spiriva Respimat to see if she benefits. We will discuss next time her improvement and decide whether to continue this. She will use albuterol as needed.

## 2016-07-12 NOTE — Addendum Note (Signed)
Addended by: Tyson Dense on: 07/12/2016 03:54 PM   Modules accepted: Orders

## 2016-07-12 NOTE — Assessment & Plan Note (Addendum)
Restart loratadine 10 mg daily

## 2016-07-12 NOTE — Progress Notes (Signed)
Patient seen in the office today and instructed on use of Spiriva 1.25.  Patient expressed understanding and demonstrated technique.  ?

## 2016-07-12 NOTE — Patient Instructions (Signed)
Please take doxycycline 100mg  twice a day for 5 days.  Please start your Claritin (loratadine) 10mg  daily We will start Spiriva Respimat 2 puffs once a day every day. Continue this until next visit and then we can discuss whether it has been helpful.  Take albuterol 2 puffs up to every 4 hours if needed for shortness of breath.  Continue your Nexium once a day.  Follow with Dr Lamonte Sakai in 1 month or next available to review your status

## 2016-07-16 ENCOUNTER — Ambulatory Visit (INDEPENDENT_AMBULATORY_CARE_PROVIDER_SITE_OTHER): Payer: Medicare Other | Admitting: Physician Assistant

## 2016-07-16 ENCOUNTER — Encounter: Payer: Self-pay | Admitting: Physician Assistant

## 2016-07-16 VITALS — BP 114/64 | HR 68 | Temp 97.9°F | Resp 16 | Wt 152.4 lb

## 2016-07-16 DIAGNOSIS — L03114 Cellulitis of left upper limb: Secondary | ICD-10-CM | POA: Diagnosis not present

## 2016-07-16 MED ORDER — MUPIROCIN 2 % EX OINT: TOPICAL_OINTMENT | CUTANEOUS | 0 refills | Status: DC

## 2016-07-16 NOTE — Progress Notes (Signed)
Patient ID: Brittany Kirk MRN: [id number], DOB: Sep 19, 1945, 71 y.o. Date of Encounter: 07/16/2016, 12:55 PM    Chief Complaint:  Chief Complaint  Patient presents with  . boil on left hand    x2wks     HPI: 71 y.o. year old female presents with above.   States that she went to the ER about 2 weeks ago. She says that a couple of days after that ER visit she noticed this spot on her left hand. States that it is painful. I asked if it was possible that she had burned the edge of her hand when cooking and she says no that she has not. She has noticed no other skin lesions or similar areas on any other areas of her skin. She has never had anything like this before. She has had no drainage from the site. She has been feeling in her usual state of health. Is having no other associated signs or symptoms.     Home Meds:   Outpatient Medications Prior to Visit  Medication Sig Dispense Refill  . albuterol (PROVENTIL HFA;VENTOLIN HFA) 108 (90 Base) MCG/ACT inhaler Inhale 2 puffs into the lungs every 6 (six) hours as needed for wheezing or shortness of breath. 1 Inhaler 2  . anastrozole (ARIMIDEX) 1 MG tablet Take 1 tablet (1 mg total) by mouth daily. 90 tablet 4  . anastrozole (ARIMIDEX) 1 MG tablet TAKE 1 TABLET EVERY DAY 90 tablet 3  . aspirin 81 MG tablet Take 81 mg by mouth daily.    . Cholecalciferol (VITAMIN D3) 5000 UNITS CAPS Take 5,000 Units by mouth daily.    . cyanocobalamin 2000 MCG tablet Take 2,000 mcg by mouth daily. B12 slow release tablet daily    . diazepam (VALIUM) 5 MG tablet TAKE 1 TABLET BY MOUTH AT BEDTIME AS NEEDED FOR ANXIETY  3  . doxycycline (VIBRA-TABS) 100 MG tablet Take 1 tablet (100 mg total) by mouth 2 (two) times daily. 10 tablet 0  . escitalopram (LEXAPRO) 20 MG tablet Take 20 mg by mouth daily.    Marland Kitchen esomeprazole (NEXIUM) 40 MG capsule Take 40 mg by mouth daily at 12 noon.    Marland Kitchen HYDROcodone-acetaminophen (NORCO) 5-325 MG tablet Take 1 tablet by mouth  every 6 (six) hours as needed for moderate pain. 30 tablet 0  . losartan (COZAAR) 50 MG tablet TAKE 1 TABLET EVERY DAY (Patient taking differently: Only taking 25mg ) 90 tablet 3  . Multiple Vitamin (MULTIVITAMIN) tablet Take 1 tablet by mouth daily.    . Tiotropium Bromide Monohydrate (SPIRIVA RESPIMAT) 1.25 MCG/ACT AERS Inhale 2 puffs into the lungs daily. 1 Inhaler 0  . vitamin E 200 UNIT capsule Take 200 Units by mouth daily.    . Vitamin Mixture (VITAMIN C) LIQD Take 2 sprays by mouth daily.    Marland Kitchen atorvastatin (LIPITOR) 40 MG tablet Take 1 tablet (40 mg total) by mouth daily. 90 tablet 3  . azithromycin (ZITHROMAX) 250 MG tablet Take 1 tablet (250 mg total) by mouth daily. Take first 2 tablets together, then 1 every day until finished. (Patient not taking: Reported on 07/12/2016) 6 tablet 0  . benzonatate (TESSALON) 100 MG capsule Take 1 capsule (100 mg total) by mouth every 8 (eight) hours. (Patient not taking: Reported on 07/16/2016) 21 capsule 0  . furosemide (LASIX) 20 MG tablet Take 1 tablet (20 mg total) by mouth daily. (Patient not taking: Reported on 07/12/2016) 30 tablet 3  . predniSONE (STERAPRED UNI-PAK 21 TAB)  10 MG (21) TBPK tablet Take 6 tablets today, then decrease by 1 each day till finished (6,5,4,3,2,1) (Patient not taking: Reported on 07/12/2016) 21 tablet 0   No facility-administered medications prior to visit.     Allergies:  Allergies  Allergen Reactions  . Penicillins Anaphylaxis, Itching, Swelling and Rash    Has patient had a PCN reaction causing immediate rash, facial/tongue/throat swelling, SOB or lightheadedness with hypotension: Yes Has patient had a PCN reaction causing severe rash involving mucus membranes or skin necrosis: No Has patient had a PCN reaction that required hospitalization: Unk Has patient had a PCN reaction occurring within the last 10 years: No If all of the above answers are "NO", then may proceed with Cephalosporin use.   . Cefaclor Itching,  Swelling and Rash  . Latex Itching and Rash  . Metronidazole Itching, Swelling and Rash  . Naproxen Itching, Swelling and Rash  . Nsaids Itching, Swelling and Rash    Can take advil  . Septra [Bactrim] Itching, Swelling and Rash  . Sulfonamide Derivatives Itching, Swelling and Rash  . Tape Itching and Rash      Review of Systems: See HPI for pertinent ROS. All other ROS negative.    Physical Exam: Blood pressure 114/64, pulse 68, temperature 97.9 F (36.6 C), temperature source Oral, resp. rate 16, weight 152 lb 6.4 oz (69.1 kg), SpO2 96 %., Body mass index is 28.8 kg/m. General:  WNWD WF. Appears in no acute distress. Neck: Supple. No thyromegaly. No lymphadenopathy. Lungs: Clear bilaterally to auscultation without wheezes, rales, or rhonchi. Breathing is unlabored. Heart: Regular rhythm. No murmurs, rubs, or gallops. Msk:  Strength and tone normal for age. Extremities/Skin: Left Hand---Dorsum---Lateral/Ulnar side of hand-- Lesion is ~ 0.5 cm diameter/ There is erythema around periphery/base--only ~ 1 - 2 mm of erythema around periphery. Center of this is raised lesion---top of this appears white in color. Incised this lesion---no drainage. No fluctuance. Underlying tissue is firm. Neuro: Alert and oriented X 3. Moves all extremities spontaneously. Gait is normal. CNII-XII grossly in tact. Psych:  Responds to questions appropriately with a normal affect.     ASSESSMENT AND PLAN:  71 y.o. year old female with  1. Cellulitis of left hand ? Inflamed/Infected Nodule of skin--- Discussed adding oral antibiotic. Reviewed her allergy list includes penicillin, Cefaclor, bactrim. Discussed adding doxycycline. Says she is already taking doxycycline that  was prescribed from Dr. Lamonte Sakai last week. At this time will have her apply Bactroban ointment to the site. Follow-up if site worsens or does not resolve. - mupirocin ointment (BACTROBAN) 2 %; Apply to affected site 2 times daily  Dispense:  22 g; Refill: 0   Signed, [address] [place], [place], [facility] 07/16/2016 12:55 PM

## 2016-07-19 DIAGNOSIS — L03818 Cellulitis of other sites: Secondary | ICD-10-CM | POA: Diagnosis not present

## 2016-07-19 DIAGNOSIS — Z6828 Body mass index (BMI) 28.0-28.9, adult: Secondary | ICD-10-CM | POA: Diagnosis not present

## 2016-07-19 DIAGNOSIS — Z22322 Carrier or suspected carrier of Methicillin resistant Staphylococcus aureus: Secondary | ICD-10-CM | POA: Diagnosis not present

## 2016-07-19 DIAGNOSIS — Z01419 Encounter for gynecological examination (general) (routine) without abnormal findings: Secondary | ICD-10-CM | POA: Diagnosis not present

## 2016-07-19 DIAGNOSIS — A419 Sepsis, unspecified organism: Secondary | ICD-10-CM | POA: Diagnosis not present

## 2016-07-24 DIAGNOSIS — D692 Other nonthrombocytopenic purpura: Secondary | ICD-10-CM | POA: Diagnosis not present

## 2016-07-24 DIAGNOSIS — C44629 Squamous cell carcinoma of skin of left upper limb, including shoulder: Secondary | ICD-10-CM | POA: Diagnosis not present

## 2016-07-25 ENCOUNTER — Ambulatory Visit (INDEPENDENT_AMBULATORY_CARE_PROVIDER_SITE_OTHER): Payer: Medicare Other | Admitting: Diagnostic Neuroimaging

## 2016-07-25 ENCOUNTER — Encounter: Payer: Self-pay | Admitting: Diagnostic Neuroimaging

## 2016-07-25 VITALS — BP 116/61 | HR 67 | Wt 156.2 lb

## 2016-07-25 DIAGNOSIS — G250 Essential tremor: Secondary | ICD-10-CM | POA: Diagnosis not present

## 2016-07-25 DIAGNOSIS — R269 Unspecified abnormalities of gait and mobility: Secondary | ICD-10-CM

## 2016-07-25 DIAGNOSIS — R42 Dizziness and giddiness: Secondary | ICD-10-CM | POA: Diagnosis not present

## 2016-07-25 NOTE — Progress Notes (Signed)
[facility]  PATIENT: Brittany Kirk DOB: [birth year]  REFERRING CLINICIAN:  Theotis Burrow HISTORY FROM: patient  REASON FOR VISIT: new consult / existing patient   HISTORICAL  CHIEF COMPLAINT:  Chief Complaint  Patient presents with  . Frequent falls/weakness of extremity    rm 7, "weakness all over my body sometimes when I stand up; sometimes I shake so bad- my fingers, my head; dizziness w/sudden movements"    HISTORY OF PRESENT ILLNESS:   UPDATE 07/25/16: 71 year old female here for increasing dizziness, lightheadedness, balance and walking problems. Has had some falls. Symptoms present for several years, but worsening in the last few months. No focal numbness or weakness. Has had 1 episode of near syncope (collapsed to ground, but did not lose consciousness). Mild neck pain. No low back pain. Has been to PCP and ER recently.   PRIOR HPI (11/27/12): 72 year old right-handed female here for evaluation of numbness and tingling, balance difficulty dizziness. Since January 2014, when patient quit smoking, patient has had increasing problems with dizziness, balance, tingling sensation in head and scalp, including an episode of tingling and burning sensation in her chest and arms. Symptoms fluctuate. Dizziness seems to occur when she tilts her head or to the side. Her balance is getting worse over time. Patient also has remote history of REM behavior sleep disorder, with violent themes, acting them out in her sleep, previously treated with Klonopin. This is possibly 5 or 6 years ago. She is no longer and Klonopin. Patient also has history of postural tremor in her hands and head, diagnosed with essential tremor. She was treated with propranolol the past, this had to be stopped the to bradycardia. She has not tried primidone. Patient does endorse some anxiety-type symptoms, currently on Lexapro.  REVIEW OF SYSTEMS: Full 14 system review of systems performed and negative  with exception of: fatigue cough excessive thirst weakness tremors near syncope dizziness headache numbness walking difficulty.    ALLERGIES: Allergies  Allergen Reactions  . Penicillins Anaphylaxis, Itching, Swelling and Rash    Has patient had a PCN reaction causing immediate rash, facial/tongue/throat swelling, SOB or lightheadedness with hypotension: Yes Has patient had a PCN reaction causing severe rash involving mucus membranes or skin necrosis: No Has patient had a PCN reaction that required hospitalization: Unk Has patient had a PCN reaction occurring within the last 10 years: No If all of the above answers are "NO", then may proceed with Cephalosporin use.   . Cefaclor Itching, Swelling and Rash  . Latex Itching and Rash  . Metronidazole Itching, Swelling and Rash  . Naproxen Itching, Swelling and Rash  . Nsaids Itching, Swelling and Rash    Can take advil  . Septra [Bactrim] Itching, Swelling and Rash  . Sulfonamide Derivatives Itching, Swelling and Rash  . Tape Itching and Rash    HOME MEDICATIONS: Outpatient Medications Prior to Visit  Medication Sig Dispense Refill  . albuterol (PROVENTIL HFA;VENTOLIN HFA) 108 (90 Base) MCG/ACT inhaler Inhale 2 puffs into the lungs every 6 (six) hours as needed for wheezing or shortness of breath. 1 Inhaler 2  . anastrozole (ARIMIDEX) 1 MG tablet Take 1 tablet (1 mg total) by mouth daily. 90 tablet 4  . aspirin 81 MG tablet Take 81 mg by mouth daily.    Marland Kitchen azithromycin (ZITHROMAX) 250 MG tablet Take 1 tablet (250 mg total) by mouth daily. Take first 2 tablets together, then 1 every day until finished. 6 tablet 0  . benzonatate (  TESSALON) 100 MG capsule Take 1 capsule (100 mg total) by mouth every 8 (eight) hours. 21 capsule 0  . Cholecalciferol (VITAMIN D3) 5000 UNITS CAPS Take 5,000 Units by mouth daily.    . cyanocobalamin 2000 MCG tablet Take 2,000 mcg by mouth daily. B12 slow release tablet daily    . diazepam (VALIUM) 5 MG tablet  TAKE 1 TABLET BY MOUTH AT BEDTIME AS NEEDED FOR ANXIETY  3  . escitalopram (LEXAPRO) 20 MG tablet Take 20 mg by mouth daily.    Marland Kitchen esomeprazole (NEXIUM) 40 MG capsule Take 40 mg by mouth daily at 12 noon.    Marland Kitchen HYDROcodone-acetaminophen (NORCO) 5-325 MG tablet Take 1 tablet by mouth every 6 (six) hours as needed for moderate pain. 30 tablet 0  . losartan (COZAAR) 50 MG tablet TAKE 1 TABLET EVERY DAY (Patient taking differently: Only taking 25mg ) 90 tablet 3  . Multiple Vitamin (MULTIVITAMIN) tablet Take 1 tablet by mouth daily.    . mupirocin ointment (BACTROBAN) 2 % Apply to affected site 2 times daily 22 g 0  . vitamin E 200 UNIT capsule Take 200 Units by mouth daily.    . Vitamin Mixture (VITAMIN C) LIQD Take 2 sprays by mouth daily.    Marland Kitchen atorvastatin (LIPITOR) 40 MG tablet Take 1 tablet (40 mg total) by mouth daily. 90 tablet 3  . Tiotropium Bromide Monohydrate (SPIRIVA RESPIMAT) 1.25 MCG/ACT AERS Inhale 2 puffs into the lungs daily. (Patient not taking: Reported on 07/25/2016) 1 Inhaler 0  . anastrozole (ARIMIDEX) 1 MG tablet TAKE 1 TABLET EVERY DAY 90 tablet 3  . furosemide (LASIX) 20 MG tablet Take 1 tablet (20 mg total) by mouth daily. (Patient not taking: Reported on 07/12/2016) 30 tablet 3  . predniSONE (STERAPRED UNI-PAK 21 TAB) 10 MG (21) TBPK tablet Take 6 tablets today, then decrease by 1 each day till finished (6,5,4,3,2,1) (Patient not taking: Reported on 07/12/2016) 21 tablet 0   No facility-administered medications prior to visit.     PAST MEDICAL HISTORY: Past Medical History:  Diagnosis Date  . Allergic rhinitis   . Asymptomatic bilateral carotid artery stenosis   . CAD (coronary artery disease)   . Cancer ([place])   . Complication of anesthesia    sore throat after a surgery  . Depression   . Emphysema    no longer uses inhalers  . Gallstones    nausea, pain upper right abdomen  . GERD (gastroesophageal reflux disease)   . H/O hiatal hernia   . History of skin cancer   .  Hyperlipidemia   . Hypertension   . Shortness of breath    sob if walks up flight of stairs  . Tobacco user   . Wears dentures    top    PAST SURGICAL HISTORY: Past Surgical History:  Procedure Laterality Date  . BREAST LUMPECTOMY Right 2015  . CHOLECYSTECTOMY  07/08/2012   Procedure: LAPAROSCOPIC CHOLECYSTECTOMY WITH INTRAOPERATIVE CHOLANGIOGRAM;  Surgeon: Gayland Curry, MD,FACS;  Location: [facility];  Service: General;  Laterality: N/A;  Laparoscopic Cholecystectomy with Intraoperative Cholangiogram  . COLONOSCOPY    . HAND SURGERY Left    wrist  . TONSILECTOMY, ADENOIDECTOMY, BILATERAL MYRINGOTOMY AND TUBES    . TUBAL LIGATION      FAMILY HISTORY: Family History  Problem Relation Age of Onset  . Arthritis Mother   . Heart disease      5 brothers and 2 sister  . Diverticulitis Sister   . Aplastic anemia Other  SOCIAL HISTORY:  Social History   Social History  . Marital status: Married    Spouse name: N/A  . Number of children: 1  . Years of education: N/A   Occupational History  . 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  Retired   Social History Main Topics  . Smoking status: Former Smoker    Packs/day: 0.50    Years: 40.00    Types: Cigarettes    Quit date: 02/13/2011  . Smokeless tobacco: Never Used  . Alcohol use No  . Drug use: No  . Sexual activity: Not on file   Other Topics Concern  . Not on file   Social History Narrative  . No narrative on file     PHYSICAL EXAM  GENERAL EXAM/CONSTITUTIONAL: Vitals:  Vitals:   07/25/16 1021  BP: 116/61  Pulse: 67  Weight: 156 lb 3.2 oz (70.9 kg)     Body mass index is 29.51 kg/m.  No exam data present  Patient is in no distress; well developed, nourished and groomed; neck is supple  CARDIOVASCULAR:  Examination of carotid arteries is normal; no carotid bruits  Regular rate and rhythm, no murmurs  Examination of peripheral vascular system by observation and palpation is  normal  EYES:  Ophthalmoscopic exam of optic discs and posterior segments is normal; no papilledema or hemorrhages  MUSCULOSKELETAL:  Gait, strength, tone, movements noted in Neurologic exam below  NEUROLOGIC: MENTAL STATUS:  No flowsheet data found.  awake, alert, oriented to person, place and time  recent and remote memory intact  normal attention and concentration  language fluent, comprehension intact, naming intact,   fund of knowledge appropriate  CRANIAL NERVE:   2nd - no papilledema on fundoscopic exam  2nd, 3rd, 4th, 6th - pupils equal and reactive to light, visual fields full to confrontation, extraocular muscles intact, no nystagmus  5th - facial sensation symmetric  7th - facial strength symmetric  8th - hearing intact  9th - palate elevates symmetrically, uvula midline  11th - shoulder shrug symmetric  12th - tongue protrusion midline  SOFT VOICE  MOTOR:   normal bulk and tone, full strength in the BUE, BLE  MINIMAL POSTURAL TREMOR IN BUE  MINIMAL ACTION TREMOR IN BUE  MILD HAD TREMOR  SENSORY:   normal and symmetric to light touch, pinprick, temperature, vibration  SL DECR VIB AT TOES  COORDINATION:   finger-nose-finger, fine finger movements SLOW   REFLEXES:   deep tendon reflexes TRACE and symmetric  ABSENT AT ANKLES  GAIT/STATION:   narrow based gait; STOOPED POSTURE; SMALL SHORT STEPS; DECR ARM SWING; CANNOT WALK TANDEM; DIFF WITH TOE AND HEEL GAIT    DIAGNOSTIC DATA (LABS, IMAGING, TESTING) - I reviewed patient records, labs, notes, testing and imaging myself where available.  Lab Results  Component Value Date   WBC 13.5 (H) 07/06/2016   HGB 13.3 07/06/2016   HCT 39.0 07/06/2016   MCV 96.7 07/06/2016   PLT 279 07/06/2016      Component Value Date/Time   NA 139 07/06/2016 1414   NA 140 02/23/2016 1448   K 3.8 07/06/2016 1414   K 4.6 02/23/2016 1448   CL 101 07/06/2016 1414   CO2 26 07/06/2016 1400   CO2  25 02/23/2016 1448   GLUCOSE 115 (H) 07/06/2016 1414   GLUCOSE 114 02/23/2016 1448   BUN 27 (H) 07/06/2016 1414   BUN 21.6 02/23/2016 1448   CREATININE 1.30 (H) 07/06/2016 1414   CREATININE 1.27 (H)  07/05/2016 1255   CREATININE 1.1 02/23/2016 1448   CALCIUM 8.7 (L) 07/06/2016 1400   CALCIUM 8.9 02/23/2016 1448   PROT 5.4 (L) 07/06/2016 1400   PROT 6.3 (L) 02/23/2016 1448   ALBUMIN 3.0 (L) 07/06/2016 1400   ALBUMIN 3.1 (L) 02/23/2016 1448   AST 16 07/06/2016 1400   AST 14 02/23/2016 1448   ALT 17 07/06/2016 1400   ALT 10 02/23/2016 1448   ALKPHOS 68 07/06/2016 1400   ALKPHOS 88 02/23/2016 1448   BILITOT 0.6 07/06/2016 1400   BILITOT 0.32 02/23/2016 1448   GFRNONAA 39 (L) 07/06/2016 1400   GFRNONAA 42 (L) 07/05/2016 1255   GFRAA 45 (L) 07/06/2016 1400   GFRAA 49 (L) 07/05/2016 1255   Lab Results  Component Value Date   CHOL 181 02/20/2016   HDL 52 02/20/2016   LDLCALC 107 02/20/2016   TRIG 112 02/20/2016   CHOLHDL 3.5 02/20/2016   Lab Results  Component Value Date   HGBA1C 5.6 11/27/2012   Lab Results  Component Value Date   [id number] (H) 11/27/2012   Lab Results  Component Value Date   TSH 0.41 07/05/2016    07/06/16 CT head [I reviewed images myself and agree with interpretation. -VRP]  - Chronic atrophic changes without acute abnormality.  09/01/14 MRI brain [I reviewed images myself and agree with interpretation. -VRP]  - Mild progression of atrophy and mild chronic microvascular ischemic change since 2005 - No acute abnormality.  Negative for metastatic disease.     ASSESSMENT AND PLAN  71 y.o. year old female here with progressive balance and gait diff, essential tremor, dizziness and lightheadedness.    Ddx: CNS vascular, inflamm, infection, cervical myelopathy, lumbar radiculopathy, neuropathy, myopathy   1. Dizziness   2. Lightheaded   3. Gait difficulty   4. Essential tremor      PLAN: - check MRI brain and cervical spine (rule  out stroke, mass, cord compression) - PT evaluation - use rollator walker  Orders Placed This Encounter  Procedures  . MR BRAIN W WO CONTRAST  . MR CERVICAL SPINE WO CONTRAST   Return in about 2 months (around 09/22/2016) for or sooner as needed.  I reviewed images, labs, notes, records myself. I summarized findings and reviewed with patient, for this high risk condition (falls, gait diff; rule out stroke or cord compression) requiring high complexity decision making.    Penni Bombard, MD [id number], [id number] AM Certified in Neurology, Neurophysiology and Neuroimaging  [facility] [address], [place], [place] [address] [phone]

## 2016-07-30 ENCOUNTER — Encounter: Payer: Self-pay | Admitting: Physical Therapy

## 2016-07-30 ENCOUNTER — Ambulatory Visit: Payer: Medicare Other | Attending: Diagnostic Neuroimaging | Admitting: Physical Therapy

## 2016-07-30 DIAGNOSIS — R296 Repeated falls: Secondary | ICD-10-CM | POA: Insufficient documentation

## 2016-07-30 DIAGNOSIS — R2681 Unsteadiness on feet: Secondary | ICD-10-CM | POA: Insufficient documentation

## 2016-07-30 DIAGNOSIS — R42 Dizziness and giddiness: Secondary | ICD-10-CM | POA: Diagnosis not present

## 2016-07-30 DIAGNOSIS — R2689 Other abnormalities of gait and mobility: Secondary | ICD-10-CM | POA: Insufficient documentation

## 2016-07-30 NOTE — Therapy (Signed)
[facility] [address] [place], [place], [address] Phone: [phone]   Fax:  [phone]  Physical Therapy Evaluation  Patient Details  Name: Brittany Kirk MRN: [id number] Date of Birth: 09-13-1945 Referring Provider: Penni Bombard, MD  Encounter Date: 07/30/2016      PT End of Session - 07/30/16 1520    Visit Number 1   Number of Visits 9   Date for PT Re-Evaluation 09/28/16   Authorization Type Medicare Part A and B; G code and Progress note every 10th visit   PT Start Time 1100   PT Stop Time 1148   PT Time Calculation (min) 48 min   Activity Tolerance Patient tolerated treatment well   Behavior During Therapy [facility] for tasks assessed/performed      Past Medical History:  Diagnosis Date  . Allergic rhinitis   . Asymptomatic bilateral carotid artery stenosis   . CAD (coronary artery disease)   . Cancer ([place])   . Complication of anesthesia    sore throat after a surgery  . Depression   . Emphysema    no longer uses inhalers  . Gallstones    nausea, pain upper right abdomen  . GERD (gastroesophageal reflux disease)   . H/O hiatal hernia   . History of skin cancer   . Hyperlipidemia   . Hypertension   . Shortness of breath    sob if walks up flight of stairs  . Tobacco user   . Wears dentures    top    Past Surgical History:  Procedure Laterality Date  . BREAST LUMPECTOMY Right 2015  . CHOLECYSTECTOMY  07/08/2012   Procedure: LAPAROSCOPIC CHOLECYSTECTOMY WITH INTRAOPERATIVE CHOLANGIOGRAM;  Surgeon: Gayland Curry, MD,FACS;  Location: [facility];  Service: General;  Laterality: N/A;  Laparoscopic Cholecystectomy with Intraoperative Cholangiogram  . COLONOSCOPY    . HAND SURGERY Left    wrist  . TONSILECTOMY, ADENOIDECTOMY, BILATERAL MYRINGOTOMY AND TUBES    . TUBAL LIGATION      There were no vitals filed for this visit.       Subjective Assessment - 07/30/16 1108    Subjective Pt  presents to OPPT referred from neurologist for dizziness, imbalance, falls and difficulty with gait.  Pt also reports h/o constant tremors x multiple years; pt has experienced a decline in balance and gait x 2-3 month with 2 falls-one fall where her LE gave out and husband had to catch her; second fall was after bending down and coming up quickly and losing her balance.   Pertinent History Breast Cancer precautions-RUE restricted with lymphedema; essential tremor, venous insufficiency, COPD, falls   Limitations Walking;Writing  tremors   How long can you walk comfortably? community distances   Patient Stated Goals better balance   Currently in Pain? No/denies            [facility] PT Assessment - 07/30/16 1115      Assessment   Medical Diagnosis dizziness, progressive balance and gait difficulty, repeated falls   Referring Provider Penni Bombard, MD   Onset Date/Surgical Date 07/25/16   Hand Dominance Right   Next MD Visit after MRI   Prior Therapy one time visit with PT for scoliosis and headaches     Precautions   Precautions Fall;Other (comment)  R breast CA, R lymphedema, restricted RUE   Precaution Comments R breast CA, R lymphedema, restricted RUE     Balance Screen   Has the patient fallen in the  past 6 months Yes   How many times? 2   Has the patient had a decrease in activity level because of a fear of falling?  No   Is the patient reluctant to leave their home because of a fear of falling?  No     Home Environment   Living Environment Private residence   Living Arrangements Spouse/significant other   Available Help at Discharge Family;Available 24 hours/day   Type of Home House   Home Access Stairs to enter   Entrance Stairs-Number of Steps 2   Entrance Stairs-Rails None   Home Layout Two level;Able to live on main level with bedroom/bathroom   Alternate Level Stairs-Number of Steps 12   Alternate Level Stairs-Rails Left   Home Equipment Cane - single point   was her father's     Prior Function   Level of Independence Independent   Vocation Retired     Observation/Other Assessments   Focus on Therapeutic Outcomes (FOTO)  69% (31% limited, 19% predicted)   Other Surveys  Other Surveys   Dizziness Handicap Inventory ([place])  22     Observation/Other Assessments-Edema    Edema --  RUE lymphedema     Sensation   Light Touch Appears Intact     Coordination   Gross Motor Movements are Fluid and Coordinated No   Heel Shin Test limited by weakness, slow     ROM / Strength   AROM / PROM / Strength Strength     Strength   Overall Strength Deficits   Overall Strength Comments bilaterally: 3/5 hip flexion, 4/5 knee flexion/extension and ankle DF            Vestibular Assessment - 07/30/16 1124      Vestibular Assessment   General Observation tremors; denies N and V, changes in vision and hearing     Symptom Behavior   Type of Dizziness Lightheadedness   Frequency of Dizziness intermittent   Duration of Dizziness 1 min   Aggravating Factors Sit to stand   Relieving Factors Comments  resolves spontaneously     Occulomotor Exam   Occulomotor Alignment Normal   Spontaneous Absent   Gaze-induced Absent   Smooth Pursuits Saccades   Saccades Slow     Vestibulo-Occular Reflex   VOR 1 Head Only (x 1 viewing) Normal   VOR to Slow Head Movement Normal   VOR Cancellation Normal   Comment HIT: negative bilat     Positional Sensitivities   Nose to Right Knee No dizziness   Right Knee to Sitting Lightedness   Nose to Left Knee No dizziness   Left Knee to Sitting Lightheadedness   Head Turning x 5 Lightheadedness   Head Nodding x 5 Mild dizziness     Orthostatics   BP sitting 126/76   HR sitting 64   BP standing (after 1 minute) 104/60  mild lightheadedness   HR standing (after 1 minute) 65   BP standing (after 3 minutes) 110/64   HR standing (after 3 minutes) 65   Orthostatics Comment mild light headedness after 3  minutes, educated on compression stocking            PT Education - 07/30/16 1520    Education provided Yes   Education Details Clinical findings-BP, PT goals and POC.  Recommendation to purchase compression stockings to assist with orthostatic BP management.     Person(s) Educated Patient   Methods Explanation   Comprehension Verbalized understanding  PT Short Term Goals - 07/30/16 1545      PT SHORT TERM GOAL #1   Title (TARGET DATE FOR ALL STG 08/29/16) Pt will demonstrate independence with balance and LE strengthening HEP   Time 4   Period Weeks   Status New     PT SHORT TERM GOAL #2   Title Will participate in gait and balance assessments: gait velocity and BERG with LTG to be set   Time 4   Period Weeks   Status New     PT SHORT TERM GOAL #3   Title Pt will participate in functional LE strength assessment, 5 times sit to stand, with LTG to be set   Time 4   Period Weeks   Status New     PT SHORT TERM GOAL #4   Title Pt will decrease falls risk and will improve TUG to <11 seconds   Time 4   Period Weeks   Status New           PT Long Term Goals - 07/30/16 1633      PT LONG TERM GOAL #1   Title (TARGET DATE FOR ALL LTG 09/28/16) Pt will report improvement in dizziness during day to day activities with a 18 point decrease in [place]   Baseline 22   Time 8   Period Weeks   Status New     PT LONG TERM GOAL #2   Title Pt will decrease falls risk as indicated by a increase in BERG balance score >5 points   Baseline TBD   Time 8   Period Weeks   Status New     PT LONG TERM GOAL #3   Title Pt will decrease falls risk and improve safety with gait as indicated by an increase in gait velocity by .51 ft/sec   Baseline TBD   Time 8   Period Weeks   Status New     PT LONG TERM GOAL #4   Title Pt will ambulate safely x 1,000' over indoor and outdoor/uneven surfaces with LRAD Mod I   Time 8   Period Weeks   Status New           Plan -  07/30/16 1521    Clinical Impression Statement Pt is a 70 year old female presenting to OPPT neuro for high complexity PT evaluation for dizziness, progressive balance and gait difficulty, and repeated falls. Pt's PMH significant for the following: breast cancer with R lumpectomy, RUE lympedema, LE venous insufficiency, COPD, repeated falls, headaches, tremors. The following deficits were noted during pt's exam: impaired strength, impaired coordination with bradykinesia, impaired balance and gait. Pt's TUG score of 11.85 is on the threshold to indicate pt is at increased risk for falls.  Pt did present with lightheadedness during transitional movements of sit <> stand but this appears to be related to orthostatic hypotension; recommended to pt she go ahead with obtaining and wearing compression stockings. Pt would benefit from skilled PT to address these impairments and functional limitations to maximize functional mobility independence and reduce falls risk.    Rehab Potential Good   Clinical Impairments Affecting Rehab Potential progressive LE weakness, imbalance, falls-participating in neuro work up (possible parkinson's?), LE venous insufficiency, COPD, tremor   PT Frequency 1x / week   PT Duration 8 weeks   PT Treatment/Interventions ADLs/Self Care Home Management;Canalith Repostioning;DME Instruction;Gait training;Stair training;Functional mobility training;Therapeutic activities;Therapeutic exercise;Balance training;Neuromuscular re-education;Patient/family education;Vestibular   PT Next Visit Plan Perform assessment of gait and  balance and functional LE strength: five times sit to stand, BERG, gait velocity   PT Home Exercise Plan PWR!   Recommended Other Services OT: UE coordination   Consulted and Agree with Plan of Care Patient      Patient will benefit from skilled therapeutic intervention in order to improve the following deficits and impairments:  Abnormal gait, Decreased balance,  Decreased coordination, Decreased mobility, Decreased strength, Difficulty walking, Dizziness, Postural dysfunction  Visit Diagnosis: Dizziness and giddiness  Unsteadiness on feet  Repeated falls  Other abnormalities of gait and mobility      G-Codes - 08/02/16 1640    Functional Assessment Tool Used Clinical judgement;Other (comment)  TUG   Functional Assessment Tool Used TUG  = 11.85 seconds, DHI = 22   Functional Limitation Mobility: Walking and moving around   Mobility: Walking and Moving Around Current Status [phone]) At least 20 percent but less than 40 percent impaired, limited or restricted   Mobility: Walking and Moving Around Goal Status [phone]) At least 1 percent but less than 20 percent impaired, limited or restricted       Problem List Patient Active Problem List   Diagnosis Date Noted  . Acute bronchitis 07/12/2016  . Bilateral carotid artery disease ([place]) 02/14/2016  . Chest pain 01/13/2016  . Osteoporosis 02/22/2015  . CKD (chronic kidney disease), stage III 08/16/2014  . Breast cancer of upper-outer quadrant of right female breast ([place]) 03/22/2014  . Dizziness and giddiness 11/27/2012  . Essential tremor 11/27/2012  . Numbness 11/27/2012  . TOBACCO USER 10/04/2009  . CLOSED FRACTURE OF ONE RIB 07/12/2009  . HYPERLIPIDEMIA TYPE IIB / III 09/28/2008  . CAD, NATIVE VESSEL 09/28/2008  . Rhinitis 09/09/2008  . EMPHYSEMA 09/07/2008  . Essential hypertension 09/06/2008  . SKIN CANCER, HX OF 09/06/2008   Raylene Everts, PT, DPT 08/02/16    4:47 PM   [facility] [address] [place], [place], [address] Phone: [phone]   Fax:  [phone]  Name: Brittany Kirk MRN: [id number] Date of Birth: [birth year]

## 2016-08-01 ENCOUNTER — Ambulatory Visit
Admission: RE | Admit: 2016-08-01 | Discharge: 2016-08-01 | Disposition: A | Discharge status: Home / Self Care | Payer: Medicare Other | Source: Ambulatory Visit | Attending: Diagnostic Neuroimaging | Admitting: Diagnostic Neuroimaging

## 2016-08-01 DIAGNOSIS — R42 Dizziness and giddiness: Secondary | ICD-10-CM

## 2016-08-01 DIAGNOSIS — R269 Unspecified abnormalities of gait and mobility: Secondary | ICD-10-CM

## 2016-08-01 DIAGNOSIS — G250 Essential tremor: Secondary | ICD-10-CM

## 2016-08-01 MED ORDER — GADOBENATE DIMEGLUMINE 529 MG/ML IV SOLN
7.0000 mL | Freq: Once | INTRAVENOUS | Status: AC | PRN
  Administered 2016-08-01: 7 mL via INTRAVENOUS

## 2016-08-07 ENCOUNTER — Telehealth: Payer: Self-pay | Admitting: Diagnostic Neuroimaging

## 2016-08-07 NOTE — Telephone Encounter (Signed)
Unremarkable studies. -VRP

## 2016-08-07 NOTE — Telephone Encounter (Signed)
Per Dr Leta Baptist, spoke with patient and informed her that her MRI brain, cervical spine results are unremarkable. Advised her to complete her PT and come in for follow up. She verbalized understanding, appreciation.

## 2016-08-07 NOTE — Telephone Encounter (Signed)
Patient called office in reference to receiving MRI results.  Please call °

## 2016-08-10 ENCOUNTER — Ambulatory Visit (INDEPENDENT_AMBULATORY_CARE_PROVIDER_SITE_OTHER): Payer: Medicare Other | Admitting: Emergency Medicine

## 2016-08-10 ENCOUNTER — Ambulatory Visit: Payer: Medicare Other | Attending: Diagnostic Neuroimaging | Admitting: Physical Therapy

## 2016-08-10 ENCOUNTER — Encounter: Payer: Self-pay | Admitting: Physical Therapy

## 2016-08-10 ENCOUNTER — Encounter: Payer: Self-pay | Admitting: Emergency Medicine

## 2016-08-10 DIAGNOSIS — R2681 Unsteadiness on feet: Secondary | ICD-10-CM | POA: Insufficient documentation

## 2016-08-10 DIAGNOSIS — K219 Gastro-esophageal reflux disease without esophagitis: Secondary | ICD-10-CM

## 2016-08-10 DIAGNOSIS — R42 Dizziness and giddiness: Secondary | ICD-10-CM | POA: Insufficient documentation

## 2016-08-10 DIAGNOSIS — R059 Cough, unspecified: Secondary | ICD-10-CM

## 2016-08-10 DIAGNOSIS — J438 Other emphysema: Secondary | ICD-10-CM

## 2016-08-10 DIAGNOSIS — R05 Cough: Secondary | ICD-10-CM | POA: Diagnosis not present

## 2016-08-10 DIAGNOSIS — R2689 Other abnormalities of gait and mobility: Secondary | ICD-10-CM | POA: Insufficient documentation

## 2016-08-10 DIAGNOSIS — R296 Repeated falls: Secondary | ICD-10-CM | POA: Insufficient documentation

## 2016-08-10 DIAGNOSIS — J301 Allergic rhinitis due to pollen: Secondary | ICD-10-CM

## 2016-08-10 NOTE — Assessment & Plan Note (Addendum)
We will continue your Spiriva Respimat once a day.  Keep albuterol available to use 2 puffs up to every 4 hours if needed for shortness of breath.  Follow with Dr Lamonte Sakai in 6 months or sooner if you have any problems

## 2016-08-10 NOTE — Patient Instructions (Signed)
PWR in standing

## 2016-08-10 NOTE — Assessment & Plan Note (Signed)
Contributions from GERD and allergic rhinitis. We will work to better address the rhinitis which is poorly controlled.

## 2016-08-10 NOTE — Assessment & Plan Note (Signed)
Continue your Nexium once daily.

## 2016-08-10 NOTE — Patient Instructions (Addendum)
We will continue your Spiriva Respimat once a day.  Keep albuterol available to use 2 puffs up to every 4 hours if needed for shortness of breath.  Try changing loratadine to Zyrtec once a day Start using your Flonase 2 sprays each side every day.  Continue your Nexium once daily.  Follow with Dr Lamonte Sakai in 6 months or sooner if you have any problems

## 2016-08-10 NOTE — Assessment & Plan Note (Signed)
Try changing loratadine to Zyrtec once a day Start using your Flonase 2 sprays each side every day.

## 2016-08-10 NOTE — Progress Notes (Signed)
Subjective:    Patient ID: Brittany Kirk, female    DOB: 09/20/1945, 71 y.o.   MRN: [id number]  HPI 71 year old former smoker with a history of COPD, HTN, coronary artery disease, allergic rhinitis, hiatal hernia with GERD, breast CA s/p R lumpectomy + anastrozole. Has been followed by Dr Joya Gaskins before for her COPD. Also had some stable pulm nodules that were followed by CT chest, last was reassuring in 11/14/10.   She returns today reporting that she has experienced continuous cough since about 1 month ago. She was treated for an acute bronchitis about 6 weeks ago, improved for a while, but then developed sneezing, congestion and cough that is occasionally productive of clear mucous. She was started on a sample of symbicort a month ago, though it might have helped her. She has tried flonase without improvement, allegra, now off of these.   ROV 07/12/16 -- This follow-up visit for chronic cough setting of known COPD, allergic rhinitis, hiatal hernia with GERD. She was treated for an acute bronchitis following what sounded like a viral upper respiratory infection 06/28/16. She was given for mycin and prednisone. She then returned to the emergency department 07/06/16 with dizziness, weakness. There was no clear etiology. She's been referred to [facility] neurology. We did PFT today that I have reviewed >> spirometry shows mixed obstruction and restriction without a bronchodilator response, total lung capacity is restricted, the diffusion capacity is decreased but corrects to the normal range when adjusted for alveolar volume. FEV1 1.21 L (63% predicted). She is feeling better now, but still coughing, has some yellow mucous. She is on nexium qd, can sometimes have breakthru sx. She is not on any allergy meds currently. She uses albuterol occasionally, seems to help her breathing.   ROV 08/10/16 -- This follow-up visit for chronic cough as well as COPD, allergic rhinitis, GERD with a hiatal hernia. Her spirometry  confirmed mixed obstruction and restriction and we started Spiriva Respimat at her last visit. Given her continued cough we also restarted loratadine 10 mg, treated her with doxycycline in the event that there was residual purulent bronchitis. She was continued on Nexium once a day. She feels that she has benefited from the spiriva, less exertional SOB w chores, stairs. She has albuterol but has not needed.    Review of Systems  Constitutional: Negative for fever and unexpected weight change.  HENT: Positive for congestion and sneezing. Negative for dental problem, ear pain, nosebleeds, postnasal drip, rhinorrhea, sinus pressure, sore throat and trouble swallowing.   Eyes: Negative for redness and itching.  Respiratory: Positive for cough and shortness of breath. Negative for chest tightness and wheezing.   Cardiovascular: Negative for palpitations and leg swelling.  Gastrointestinal: Negative for nausea and vomiting.  Genitourinary: Negative for dysuria.  Musculoskeletal: Negative for joint swelling.  Skin: Negative for rash.  Neurological: Negative for headaches.  Hematological: Does not bruise/bleed easily.  Psychiatric/Behavioral: Negative for dysphoric mood. The patient is nervous/anxious.        Objective:   Physical Exam Vitals:   08/10/16 1448  BP: (!) 110/58  Pulse: 71  SpO2: 98%  Weight: 152 lb 6.4 oz (69.1 kg)  Height: 5\' 1"  (1.549 m)   Gen: Pleasant, well-nourished, in no distress,  normal affect  ENT: No lesions,  mouth clear,  oropharynx clear, no postnasal drip  Neck: No JVD, no TMG, no carotid bruits  Lungs: No use of accessory muscles,  clear without rales or rhonchi  Cardiovascular: RRR, heart sounds  normal, no murmur or gallops, no peripheral edema  Musculoskeletal: No deformities, no cyanosis or clubbing  Neuro: alert, non focal  Skin: Warm, no lesions or rashes      Assessment & Plan:  EMPHYSEMA We will continue your Spiriva Respimat once a day.    Keep albuterol available to use 2 puffs up to every 4 hours if needed for shortness of breath.  Follow with Dr Lamonte Sakai in 6 months or sooner if you have any problems  Rhinitis Try changing loratadine to Zyrtec once a day Start using your Flonase 2 sprays each side every day.   GERD (gastroesophageal reflux disease) Continue your Nexium once daily.   Cough Contributions from GERD and allergic rhinitis. We will work to better address the rhinitis which is poorly controlled.   Baltazar Apo, MD, PhD 08/10/2016, 3:04 PM [facility] [phone] or if no answer [phone] .Marland Kitchen/.

## 2016-08-10 NOTE — Therapy (Signed)
[facility] [address] [place], [place], [address] Phone: [phone]   Fax:  [phone]  Physical Therapy Treatment  Patient Details  Name: Brittany Kirk MRN: [id number] Date of Birth: [birth year] Referring Provider: Penni Bombard, MD  Encounter Date: 08/10/2016      PT End of Session - 08/10/16 1401    Visit Number 2   Number of Visits 9   Date for PT Re-Evaluation 09/28/16   Authorization Type Medicare Part A and B; G code and Progress note every 10th visit   PT Start Time 1318   PT Stop Time 1400   PT Time Calculation (min) 42 min   Activity Tolerance Patient tolerated treatment well   Behavior During Therapy [facility] for tasks assessed/performed      Past Medical History:  Diagnosis Date  . Allergic rhinitis   . Asymptomatic bilateral carotid artery stenosis   . CAD (coronary artery disease)   . Cancer ([place])   . Complication of anesthesia    sore throat after a surgery  . Depression   . Emphysema    no longer uses inhalers  . Gallstones    nausea, pain upper right abdomen  . GERD (gastroesophageal reflux disease)   . H/O hiatal hernia   . History of skin cancer   . Hyperlipidemia   . Hypertension   . Shortness of breath    sob if walks up flight of stairs  . Tobacco user   . Wears dentures    top    Past Surgical History:  Procedure Laterality Date  . BREAST LUMPECTOMY Right 2015  . CHOLECYSTECTOMY  07/08/2012   Procedure: LAPAROSCOPIC CHOLECYSTECTOMY WITH INTRAOPERATIVE CHOLANGIOGRAM;  Surgeon: Gayland Curry, MD,FACS;  Location: [facility];  Service: General;  Laterality: N/A;  Laparoscopic Cholecystectomy with Intraoperative Cholangiogram  . COLONOSCOPY    . HAND SURGERY Left    wrist  . TONSILECTOMY, ADENOIDECTOMY, BILATERAL MYRINGOTOMY AND TUBES    . TUBAL LIGATION      There were no vitals filed for this visit.      Subjective Assessment - 08/10/16 1322    Subjective Pt reports  no issues or falls since evaluation.  Will be following up with neurologist soon and will be getting an order for compression stockings.  Still having some lightheadedness first thing in the am or when first standing from prolonged sitting but improves by afternoon.   Pertinent History Breast Cancer precautions-RUE restricted with lymphedema; essential tremor, venous insufficiency, COPD, falls   Limitations Walking;Writing   How long can you walk comfortably? community distances   Patient Stated Goals better balance   Currently in Pain? No/denies            [facility] PT Assessment - 08/10/16 1326      Standardized Balance Assessment   Standardized Balance Assessment Berg Balance Test;Five Times Sit to Stand;10 meter walk test   Five times sit to stand comments  14.53 seconds   10 Meter Walk 10.38 seconds or 3.27ft/sec     Berg Balance Test   Sit to Stand Able to stand without using hands and stabilize independently   Standing Unsupported Able to stand safely 2 minutes   Sitting with Back Unsupported but Feet Supported on Floor or Stool Able to sit safely and securely 2 minutes   Stand to Sit Sits safely with minimal use of hands   Transfers Able to transfer safely, minor use of hands   Standing Unsupported with  Eyes Closed Able to stand 10 seconds safely   Standing Ubsupported with Feet Together Able to place feet together independently and stand 1 minute safely   From Standing, Reach Forward with Outstretched Arm Can reach confidently >25 cm (10")   From Standing Position, Pick up Object from Floor Able to pick up shoe, needs supervision   From Standing Position, Turn to Look Behind Over each Shoulder Looks behind from both sides and weight shifts well   Turn 360 Degrees Able to turn 360 degrees safely in 4 seconds or less   Standing Unsupported, Alternately Place Feet on Step/Stool Able to stand independently and safely and complete 8 steps in 20 seconds   Standing Unsupported, One Foot  in [place] to plae foot ahead of the other independently and hold 30 seconds   Standing on One Leg Tries to lift leg/unable to hold 3 seconds but remains standing independently   Total Score 51   Berg comment: 51/56 moderate falls risk           PWR [facility]) - 08/10/16 1346    PWR! exercises Moves in standing   PWR! Up x10   PWR! Rock x10   PWR! Twist x10   PWR Step x10          PT Education - 08/10/16 1400    Education provided Yes   Education Details falls risk, PWR in standing handout for weight shifting and balance training + rotation.   Person(s) Educated Patient   Methods Explanation;Demonstration;Handout   Comprehension Verbalized understanding;Returned demonstration          PT Short Term Goals - 08/10/16 1701      PT SHORT TERM GOAL #1   Title (TARGET DATE FOR ALL STG 08/29/16) Pt will demonstrate independence with balance and LE strengthening HEP   Time 4   Period Weeks   Status New     PT SHORT TERM GOAL #2   Title Will participate in gait and balance assessments: gait velocity and BERG with LTG to be set   Status Achieved     PT SHORT TERM GOAL #3   Title Pt will participate in functional LE strength assessment, 5 times sit to stand, with LTG to be set   Status Achieved     PT SHORT TERM GOAL #4   Title Pt will decrease falls risk and will improve TUG to <11 seconds   Time 4   Period Weeks   Status New           PT Long Term Goals - 08/10/16 1701      PT LONG TERM GOAL #1   Title (TARGET DATE FOR ALL LTG 09/28/16) Pt will report improvement in dizziness during day to day activities with a 18 point decrease in [place]   Baseline 22   Time 8   Period Weeks   Status New     PT LONG TERM GOAL #2   Title Pt will decrease falls risk as indicated by a increase in BERG balance score >5 points   Baseline 51/56   Time 8   Period Weeks   Status Revised     PT LONG TERM GOAL #3   Title Pt will decrease falls risk and improve safety with gait as  indicated by an increase in gait velocity by .51 ft/sec   Baseline 3.51ft/sec   Time 8   Period Weeks   Status Revised     PT LONG TERM GOAL #4  Title Pt will ambulate safely x 1,000' over indoor and outdoor/uneven surfaces with LRAD Mod I   Time 8   Period Weeks   Status New     PT LONG TERM GOAL #5   Title Pt will demonstrate improved LE strength as indicated by decrease in five time sit <> stand time by 4 seconds   Baseline 14.53   Time 8   Period Weeks   Status New               Plan - 08/10/16 1658    Clinical Impression Statement Treatment session today focused on continued assessment of gait, balance and falls risk.  Clinical findings discussed with pt.  Initiated weight shifting and balance HEP with pt due to pt demonstrating moderate falls risk.  Pt to f/u with MD regarding compression stockings.  Will continue to work towards targeted goals.    Clinical Impairments Affecting Rehab Potential progressive LE weakness, imbalance, falls-participating in neuro work up (possible parkinson's?), LE venous insufficiency, COPD, tremor   PT Treatment/Interventions ADLs/Self Care Home Management;Canalith Repostioning;DME Instruction;Gait training;Stair training;Functional mobility training;Therapeutic activities;Therapeutic exercise;Balance training;Neuromuscular re-education;Patient/family education;Vestibular   PT Next Visit Plan Continue higher level balance and gait training   PT Home Exercise Plan PWR!   Consulted and Agree with Plan of Care Patient      Patient will benefit from skilled therapeutic intervention in order to improve the following deficits and impairments:  Abnormal gait, Decreased balance, Decreased coordination, Decreased mobility, Decreased strength, Difficulty walking, Dizziness, Postural dysfunction  Visit Diagnosis: Dizziness and giddiness  Unsteadiness on feet  Repeated falls  Other abnormalities of gait and mobility     Problem  List Patient Active Problem List   Diagnosis Date Noted  . GERD (gastroesophageal reflux disease) 08/10/2016  . Cough 08/10/2016  . Acute bronchitis 07/12/2016  . Bilateral carotid artery disease ([place]) 02/14/2016  . Chest pain 01/13/2016  . Osteoporosis 02/22/2015  . CKD (chronic kidney disease), stage III 08/16/2014  . Breast cancer of upper-outer quadrant of right female breast ([place]) 03/22/2014  . Dizziness and giddiness 11/27/2012  . Essential tremor 11/27/2012  . Numbness 11/27/2012  . TOBACCO USER 10/04/2009  . CLOSED FRACTURE OF ONE RIB 07/12/2009  . HYPERLIPIDEMIA TYPE IIB / III 09/28/2008  . CAD, NATIVE VESSEL 09/28/2008  . Rhinitis 09/09/2008  . EMPHYSEMA 09/07/2008  . Essential hypertension 09/06/2008  . SKIN CANCER, HX OF 09/06/2008   Raylene Everts, PT, DPT 08/10/16    5:04 PM    [facility] [address] [place], [place], [address] Phone: [phone]   Fax:  [phone]  Name: Brittany Kirk MRN: [id number] Date of Birth: Feb 04, 1946

## 2016-08-15 ENCOUNTER — Ambulatory Visit: Payer: Medicare Other | Admitting: Physical Therapy

## 2016-08-15 ENCOUNTER — Encounter: Payer: Self-pay | Admitting: Physical Therapy

## 2016-08-15 DIAGNOSIS — R2689 Other abnormalities of gait and mobility: Secondary | ICD-10-CM | POA: Diagnosis not present

## 2016-08-15 DIAGNOSIS — R296 Repeated falls: Secondary | ICD-10-CM

## 2016-08-15 DIAGNOSIS — R42 Dizziness and giddiness: Secondary | ICD-10-CM

## 2016-08-15 DIAGNOSIS — R2681 Unsteadiness on feet: Secondary | ICD-10-CM | POA: Diagnosis not present

## 2016-08-15 NOTE — Therapy (Signed)
[facility] [address] [place], [place], [address] Phone: [phone]   Fax:  [phone]  Physical Therapy Treatment  Patient Details  Name: Brittany Kirk MRN: [id number] Date of Birth: 06-Dec-1945 Referring Provider: Penni Bombard, MD  Encounter Date: 08/15/2016      PT End of Session - 08/15/16 1242    Visit Number 3   Number of Visits 9   Date for PT Re-Evaluation 09/28/16   Authorization Type Medicare Part A and B; G code and Progress note every 10th visit   PT Start Time 1148   PT Stop Time 1230   PT Time Calculation (min) 42 min   Activity Tolerance Patient tolerated treatment well   Behavior During Therapy [facility] for tasks assessed/performed      Past Medical History:  Diagnosis Date  . Allergic rhinitis   . Asymptomatic bilateral carotid artery stenosis   . CAD (coronary artery disease)   . Cancer ([place])   . Complication of anesthesia    sore throat after a surgery  . Depression   . Emphysema    no longer uses inhalers  . Gallstones    nausea, pain upper right abdomen  . GERD (gastroesophageal reflux disease)   . H/O hiatal hernia   . History of skin cancer   . Hyperlipidemia   . Hypertension   . Shortness of breath    sob if walks up flight of stairs  . Tobacco user   . Wears dentures    top    Past Surgical History:  Procedure Laterality Date  . BREAST LUMPECTOMY Right 2015  . CHOLECYSTECTOMY  07/08/2012   Procedure: LAPAROSCOPIC CHOLECYSTECTOMY WITH INTRAOPERATIVE CHOLANGIOGRAM;  Surgeon: Gayland Curry, MD,FACS;  Location: [facility];  Service: General;  Laterality: N/A;  Laparoscopic Cholecystectomy with Intraoperative Cholangiogram  . COLONOSCOPY    . HAND SURGERY Left    wrist  . TONSILECTOMY, ADENOIDECTOMY, BILATERAL MYRINGOTOMY AND TUBES    . TUBAL LIGATION      There were no vitals filed for this visit.      Subjective Assessment - 08/15/16 1155    Subjective Reports  improvement in dizziness with only one episode.  Has been performing exercises with no issues.  Still has not gotten order for compression stockings.  Has also been performing ankle exercises to assist with blood flow.   Pertinent History Breast Cancer precautions-RUE restricted with lymphedema; essential tremor, venous insufficiency, COPD, falls   Limitations Walking;Writing   How long can you walk comfortably? community distances   Patient Stated Goals better balance   Currently in Pain? No/denies          [facility] Adult PT Treatment/Exercise - 08/15/16 1157      Exercises   Exercises Ankle     Ankle Exercises: Stretches   Gastroc Stretch 3 reps;30 seconds  standing on 6" step     Ankle Exercises: Standing   SLS 3 reps, 10 second hold each LE, bilat UE support   Heel Raises Other (comment)  12 reps, bilat UE support   Heel Raises Limitations 12 reps, bilat UE support   Toe Raise Other (comment)   Toe Raise Limitations 12 reps, bilat UE support   Heel Walk (Round Trip) 6 trips, one UE support   Toe Walk (Round Trip) 6 trips, one UE support          PWR [facility]) - 08/15/16 1215    PWR! Up x10 compliant surface  PWR! Rock x10 compliant surface   PWR! Twist x10 compliant surface   PWR Step x10 compliant surface          Balance Exercises - 08/15/16 1214      Balance Exercises: Standing   Tandem Gait Forward;Upper extremity support;4 reps           PT Education - 08/15/16 1239    Education provided Yes   Education Details HEP   Person(s) Educated Patient   Methods Explanation;Demonstration;Handout   Comprehension Verbalized understanding          PT Short Term Goals - 08/10/16 1701      PT SHORT TERM GOAL #1   Title (TARGET DATE FOR ALL STG 08/29/16) Pt will demonstrate independence with balance and LE strengthening HEP   Time 4   Period Weeks   Status New     PT SHORT TERM GOAL #2   Title Will participate in gait and balance assessments: gait velocity  and BERG with LTG to be set   Status Achieved     PT SHORT TERM GOAL #3   Title Pt will participate in functional LE strength assessment, 5 times sit to stand, with LTG to be set   Status Achieved     PT SHORT TERM GOAL #4   Title Pt will decrease falls risk and will improve TUG to <11 seconds   Time 4   Period Weeks   Status New           PT Long Term Goals - 08/10/16 1701      PT LONG TERM GOAL #1   Title (TARGET DATE FOR ALL LTG 09/28/16) Pt will report improvement in dizziness during day to day activities with a 18 point decrease in [place]   Baseline 22   Time 8   Period Weeks   Status New     PT LONG TERM GOAL #2   Title Pt will decrease falls risk as indicated by a increase in BERG balance score >5 points   Baseline 51/56   Time 8   Period Weeks   Status Revised     PT LONG TERM GOAL #3   Title Pt will decrease falls risk and improve safety with gait as indicated by an increase in gait velocity by .51 ft/sec   Baseline 3.48ft/sec   Time 8   Period Weeks   Status Revised     PT LONG TERM GOAL #4   Title Pt will ambulate safely x 1,000' over indoor and outdoor/uneven surfaces with LRAD Mod I   Time 8   Period Weeks   Status New     PT LONG TERM GOAL #5   Title Pt will demonstrate improved LE strength as indicated by decrease in five time sit <> stand time by 4 seconds   Baseline 14.53   Time 8   Period Weeks   Status New           Plan - 08/15/16 1242    Clinical Impression Statement Continued to focus on dynamic balance and weight shifting with review of PWR exercises but on compliant surface.  Reviewed and added ankle ROM, strengthening and balance exercises to HEP.  Pt making good progress towards LTG.   Clinical Impairments Affecting Rehab Potential progressive LE weakness, imbalance, falls-participating in neuro work up (possible parkinson's?), LE venous insufficiency, COPD, tremor   PT Treatment/Interventions ADLs/Self Care Home Management;Canalith  Repostioning;DME Instruction;Gait training;Stair training;Functional mobility training;Therapeutic activities;Therapeutic exercise;Balance training;Neuromuscular re-education;Patient/family education;Vestibular  PT Next Visit Plan Continue higher level balance and gait training   PT Home Exercise Plan PWR!   Consulted and Agree with Plan of Care Patient      Patient will benefit from skilled therapeutic intervention in order to improve the following deficits and impairments:  Abnormal gait, Decreased balance, Decreased coordination, Decreased mobility, Decreased strength, Difficulty walking, Dizziness, Postural dysfunction  Visit Diagnosis: Dizziness and giddiness  Unsteadiness on feet  Repeated falls  Other abnormalities of gait and mobility     Problem List Patient Active Problem List   Diagnosis Date Noted  . GERD (gastroesophageal reflux disease) 08/10/2016  . Cough 08/10/2016  . Acute bronchitis 07/12/2016  . Bilateral carotid artery disease ([place]) 02/14/2016  . Chest pain 01/13/2016  . Osteoporosis 02/22/2015  . CKD (chronic kidney disease), stage III 08/16/2014  . Breast cancer of upper-outer quadrant of right female breast ([place]) 03/22/2014  . Dizziness and giddiness 11/27/2012  . Essential tremor 11/27/2012  . Numbness 11/27/2012  . TOBACCO USER 10/04/2009  . CLOSED FRACTURE OF ONE RIB 07/12/2009  . HYPERLIPIDEMIA TYPE IIB / III 09/28/2008  . CAD, NATIVE VESSEL 09/28/2008  . Rhinitis 09/09/2008  . EMPHYSEMA 09/07/2008  . Essential hypertension 09/06/2008  . SKIN CANCER, HX OF 09/06/2008   Raylene Everts, PT, DPT 08/15/16    12:46 PM   [facility] [address] [place], [place], [address] Phone: [phone]   Fax:  [phone]  Name: Brittany Kirk MRN: [id number] Date of Birth: December 08, 1945

## 2016-08-15 NOTE — Patient Instructions (Signed)
Gastroc / Heel Cord Stretch - On Step    Stand with heels over edge of stair. Holding rail, lower heels until stretch is felt in calf of legs. Hold 30 seconds Repeat _3__ times. Do 2-3___ times per day. .  Copyright  VHI. All rights reserved.  Ankle Plantar Flexion / Dorsiflexion, Standing    Stand while holding a stable object. Rise up on toes. Then rock back on heels.  Repeat _12__ times. Do 2-3__ sessions per day.  Copyright  VHI. All rights reserved.  Walking on Toes    Walk on toes for along countertop, hold countertop if necessary while continuing on a straight path.  Perform 4 trips. Do _2-3___ sessions per day.  Copyright  VHI. All rights reserved.   Walking on Heels    Walk on heels along countertop, hold countertop if necessary continuing on a straight path.  Perform 4 trips. Do _2-3__ sessions per day.  Copyright  VHI. All rights reserved.   Feet Heel-Toe "Tandem", Varied Arm Positions - Eyes Open   With eyes open, holding on to countertop, walk forwards placing right foot directly in front of the other-keep walking placing heel in front of toes. Repeat 4 trips per session. Do 2-3 sessions per day.    Standing On One Leg Without Support .  Stand on one leg in neutral spine with one hand for support. Hold 10 seconds. Repeat on other leg. Do 2 repetitions each leg.

## 2016-08-22 ENCOUNTER — Ambulatory Visit: Payer: Medicare Other | Admitting: Physical Therapy

## 2016-08-22 ENCOUNTER — Encounter: Payer: Self-pay | Admitting: Physical Therapy

## 2016-08-22 DIAGNOSIS — R42 Dizziness and giddiness: Secondary | ICD-10-CM

## 2016-08-22 DIAGNOSIS — R296 Repeated falls: Secondary | ICD-10-CM | POA: Diagnosis not present

## 2016-08-22 DIAGNOSIS — R2681 Unsteadiness on feet: Secondary | ICD-10-CM

## 2016-08-22 DIAGNOSIS — R2689 Other abnormalities of gait and mobility: Secondary | ICD-10-CM | POA: Diagnosis not present

## 2016-08-22 NOTE — Therapy (Signed)
[facility] [address] [place], [place], [address] Phone: [phone]   Fax:  [phone]  Physical Therapy Treatment  Patient Details  Name: Brittany Kirk MRN: [id number] Date of Birth: [birth year] Referring Provider: Penni Bombard, MD  Encounter Date: 08/22/2016      PT End of Session - 08/22/16 1331    Visit Number 4   Number of Visits 9   Date for PT Re-Evaluation 09/28/16   Authorization Type Medicare Part A and B; G code and Progress note every 10th visit   PT Start Time 1318   PT Stop Time 1400   PT Time Calculation (min) 42 min   Activity Tolerance Patient tolerated treatment well   Behavior During Therapy [facility] for tasks assessed/performed      Past Medical History:  Diagnosis Date  . Allergic rhinitis   . Asymptomatic bilateral carotid artery stenosis   . CAD (coronary artery disease)   . Cancer ([place])   . Complication of anesthesia    sore throat after a surgery  . Depression   . Emphysema    no longer uses inhalers  . Gallstones    nausea, pain upper right abdomen  . GERD (gastroesophageal reflux disease)   . H/O hiatal hernia   . History of skin cancer   . Hyperlipidemia   . Hypertension   . Shortness of breath    sob if walks up flight of stairs  . Tobacco user   . Wears dentures    top    Past Surgical History:  Procedure Laterality Date  . BREAST LUMPECTOMY Right 2015  . CHOLECYSTECTOMY  07/08/2012   Procedure: LAPAROSCOPIC CHOLECYSTECTOMY WITH INTRAOPERATIVE CHOLANGIOGRAM;  Surgeon: Gayland Curry, MD,FACS;  Location: [facility];  Service: General;  Laterality: N/A;  Laparoscopic Cholecystectomy with Intraoperative Cholangiogram  . COLONOSCOPY    . HAND SURGERY Left    wrist  . TONSILECTOMY, ADENOIDECTOMY, BILATERAL MYRINGOTOMY AND TUBES    . TUBAL LIGATION      There were no vitals filed for this visit.      Subjective Assessment - 08/22/16 1324    Subjective Only one  episode of dizziness.  Has ordered compression stockings.  Reports she has cut her BP medication in half and only has dizziness on the days she takes a whole pill; pt to discuss with MD.   Pertinent History Breast Cancer precautions-RUE restricted with lymphedema; essential tremor, venous insufficiency, COPD, falls   Limitations Walking;Writing   How long can you walk comfortably? community distances   Patient Stated Goals better balance   Currently in Pain? No/denies           [facility] Adult PT Treatment/Exercise - 08/22/16 1332      Exercises   Exercises Knee/Hip     Knee/Hip Exercises: Aerobic   Nustep L6 x 10 minutes, bilat UE and LE     Knee/Hip Exercises: Standing   Forward Lunges Right;Left;10 reps   Forward Lunges Limitations with one UE support for balance, mod verbal cues for safe technique   Side Lunges Right;Left;10 reps   Side Lunges Limitations with and then without UE support with verbal cues for safe technique   Kirk Squat 5 sets;10 seconds           PWR ([facility]) - 08/22/16 1358    PWR Step x 10 reps             PT Education - 08/22/16 1502    Education  provided Yes   Education Details LE strengthening pt can perform at home   Person(s) Educated Patient   Methods Explanation;Demonstration   Comprehension Verbalized understanding;Returned demonstration          PT Short Term Goals - 08/10/16 1701      PT SHORT TERM GOAL #1   Title (TARGET DATE FOR ALL STG 08/29/16) Pt will demonstrate independence with balance and LE strengthening HEP   Time 4   Period Weeks   Status New     PT SHORT TERM GOAL #2   Title Will participate in gait and balance assessments: gait velocity and BERG with LTG to be set   Status Achieved     PT SHORT TERM GOAL #3   Title Pt will participate in functional LE strength assessment, 5 times sit to stand, with LTG to be set   Status Achieved     PT SHORT TERM GOAL #4   Title Pt will decrease falls risk and will improve  TUG to <11 seconds   Time 4   Period Weeks   Status New           PT Long Term Goals - 08/10/16 1701      PT LONG TERM GOAL #1   Title (TARGET DATE FOR ALL LTG 09/28/16) Pt will report improvement in dizziness during day to day activities with a 18 point decrease in [place]   Baseline 22   Time 8   Period Weeks   Status New     PT LONG TERM GOAL #2   Title Pt will decrease falls risk as indicated by a increase in BERG balance score >5 points   Baseline 51/56   Time 8   Period Weeks   Status Revised     PT LONG TERM GOAL #3   Title Pt will decrease falls risk and improve safety with gait as indicated by an increase in gait velocity by .51 ft/sec   Baseline 3.42ft/sec   Time 8   Period Weeks   Status Revised     PT LONG TERM GOAL #4   Title Pt will ambulate safely x 1,000' over indoor and outdoor/uneven surfaces with LRAD Mod I   Time 8   Period Weeks   Status New     PT LONG TERM GOAL #5   Title Pt will demonstrate improved LE strength as indicated by decrease in five time sit <> stand time by 4 seconds   Baseline 14.53   Time 8   Period Weeks   Status New               Plan - 08/22/16 1503    Clinical Impression Statement Pt continue to make good progress.  Able to initiate endurance training on Nustep and higher level LE strengthening and balance exercises that pt can perform at home.  Pt required verbal cues for safe sequence to avoid strain on knees; will review and provide handouts next session if pt able to perform safely/independently.     Clinical Impairments Affecting Rehab Potential progressive LE weakness, imbalance, falls-participating in neuro work up (possible parkinson's?), LE venous insufficiency, COPD, tremor   PT Treatment/Interventions ADLs/Self Care Home Management;Canalith Repostioning;DME Instruction;Gait training;Stair training;Functional mobility training;Therapeutic activities;Therapeutic exercise;Balance training;Neuromuscular  re-education;Patient/family education;Vestibular   PT Next Visit Plan CHECK STG!  PWR, LE strengthening, endurance, Continue higher level balance and gait training   PT Home Exercise Plan PWR!   Consulted and Agree with Plan of Care Patient  Patient will benefit from skilled therapeutic intervention in order to improve the following deficits and impairments:  Abnormal gait, Decreased balance, Decreased coordination, Decreased mobility, Decreased strength, Difficulty walking, Dizziness, Postural dysfunction  Visit Diagnosis: Dizziness and giddiness  Unsteadiness on feet  Repeated falls     Problem List Patient Active Problem List   Diagnosis Date Noted  . GERD (gastroesophageal reflux disease) 08/10/2016  . Cough 08/10/2016  . Acute bronchitis 07/12/2016  . Bilateral carotid artery disease ([place]) 02/14/2016  . Chest pain 01/13/2016  . Osteoporosis 02/22/2015  . CKD (chronic kidney disease), stage III 08/16/2014  . Breast cancer of upper-outer quadrant of right female breast ([place]) 03/22/2014  . Dizziness and giddiness 11/27/2012  . Essential tremor 11/27/2012  . Numbness 11/27/2012  . TOBACCO USER 10/04/2009  . CLOSED FRACTURE OF ONE RIB 07/12/2009  . HYPERLIPIDEMIA TYPE IIB / III 09/28/2008  . CAD, NATIVE VESSEL 09/28/2008  . Rhinitis 09/09/2008  . EMPHYSEMA 09/07/2008  . Essential hypertension 09/06/2008  . SKIN CANCER, HX OF 09/06/2008   Raylene Everts, PT, DPT 08/22/16    3:07 PM    [facility] [address] [place], [place], [address] Phone: [phone]   Fax:  [phone]  Name: Brittany Kirk MRN: [id number] Date of Birth: 02-26-1946

## 2016-08-29 ENCOUNTER — Ambulatory Visit: Payer: Medicare Other | Admitting: Physical Therapy

## 2016-08-29 ENCOUNTER — Encounter: Payer: Self-pay | Admitting: Physical Therapy

## 2016-08-29 DIAGNOSIS — R296 Repeated falls: Secondary | ICD-10-CM | POA: Diagnosis not present

## 2016-08-29 DIAGNOSIS — R2681 Unsteadiness on feet: Secondary | ICD-10-CM | POA: Diagnosis not present

## 2016-08-29 DIAGNOSIS — R2689 Other abnormalities of gait and mobility: Secondary | ICD-10-CM | POA: Diagnosis not present

## 2016-08-29 DIAGNOSIS — R42 Dizziness and giddiness: Secondary | ICD-10-CM

## 2016-08-29 NOTE — Therapy (Signed)
[facility] [address] [place], [place], [address] Phone: [phone]   Fax:  [phone]  Physical Therapy Treatment  Patient Details  Name: Brittany Kirk MRN: [id number] Date of Birth: 03/18/1946 Referring Provider: Penni Bombard, MD  Encounter Date: 08/29/2016      PT End of Session - 08/29/16 1233    Visit Number 5   Number of Visits 9   Date for PT Re-Evaluation 09/28/16   Authorization Type Medicare Part A and B; G code and Progress note every 10th visit   PT Start Time 1150   PT Stop Time 1228   PT Time Calculation (min) 38 min   Activity Tolerance Patient tolerated treatment well   Behavior During Therapy [facility] for tasks assessed/performed      Past Medical History:  Diagnosis Date  . Allergic rhinitis   . Asymptomatic bilateral carotid artery stenosis   . CAD (coronary artery disease)   . Cancer ([place])   . Complication of anesthesia    sore throat after a surgery  . Depression   . Emphysema    no longer uses inhalers  . Gallstones    nausea, pain upper right abdomen  . GERD (gastroesophageal reflux disease)   . H/O hiatal hernia   . History of skin cancer   . Hyperlipidemia   . Hypertension   . Shortness of breath    sob if walks up flight of stairs  . Tobacco user   . Wears dentures    top    Past Surgical History:  Procedure Laterality Date  . BREAST LUMPECTOMY Right 2015  . CHOLECYSTECTOMY  07/08/2012   Procedure: LAPAROSCOPIC CHOLECYSTECTOMY WITH INTRAOPERATIVE CHOLANGIOGRAM;  Surgeon: Gayland Curry, MD,FACS;  Location: [facility];  Service: General;  Laterality: N/A;  Laparoscopic Cholecystectomy with Intraoperative Cholangiogram  . COLONOSCOPY    . HAND SURGERY Left    wrist  . TONSILECTOMY, ADENOIDECTOMY, BILATERAL MYRINGOTOMY AND TUBES    . TUBAL LIGATION      There were no vitals filed for this visit.      Subjective Assessment - 08/29/16 1159    Subjective Pt reports  some soreness in LE after last session but it only lasted a couple of days.  Has been riding the stationary bike at home x 10 minutes.  Has not been dizzy in a week.     Pertinent History Breast Cancer precautions-RUE restricted with lymphedema; essential tremor, venous insufficiency, COPD, falls   Limitations Walking;Writing   How long can you walk comfortably? community distances   Patient Stated Goals better balance   Currently in Pain? No/denies            [facility] PT Assessment - 08/29/16 1202      Strength   Overall Strength Comments bilaterally: 4-/5 hip flexion, 5/5 knee flexion/extension     Standardized Balance Assessment   Standardized Balance Assessment Berg Balance Test;Five Times Sit to Stand;10 meter walk test   Five times sit to stand comments  12.30 seconds   10 Meter Walk 9.38 seconds or 3.49 ft/sec     Berg Balance Test   Sit to Stand Able to stand without using hands and stabilize independently   Standing Unsupported Able to stand safely 2 minutes   Sitting with Back Unsupported but Feet Supported on Floor or Stool Able to sit safely and securely 2 minutes   Stand to Sit Sits safely with minimal use of hands   Transfers Able to  transfer safely, minor use of hands   Standing Unsupported with Eyes Closed Able to stand 10 seconds safely   Standing Ubsupported with Feet Together Able to place feet together independently and stand 1 minute safely   From Standing, Reach Forward with Outstretched Arm Can reach confidently >25 cm (10")   From Standing Position, Pick up Object from [place] to pick up shoe safely and easily   From Standing Position, Turn to Look Behind Over each Shoulder Looks behind from both sides and weight shifts well   Turn 360 Degrees Able to turn 360 degrees safely in 4 seconds or less   Standing Unsupported, Alternately Place Feet on Step/Stool Able to stand independently and safely and complete 8 steps in 20 seconds   Standing Unsupported, One  Foot in [place] to place foot tandem independently and hold 30 seconds   Standing on One Leg Able to lift leg independently and hold 5-10 seconds   Total Score 55   Berg comment: 55/56 low falls risk                     [facility] Adult PT Treatment/Exercise - 08/29/16 1208      Timed Up and Go Test   TUG Normal TUG   Normal TUG (seconds) 10  previously 11.85                PT Education - 08/29/16 1233    Education provided Yes   Education Details Progress towards all goals and areas of continued focus   Person(s) Educated Patient   Methods Explanation   Comprehension Verbalized understanding          PT Short Term Goals - 08/29/16 1236      PT SHORT TERM GOAL #1   Title (TARGET DATE FOR ALL STG 08/29/16) Pt will demonstrate independence with balance and LE strengthening HEP   Baseline Met 08/29/16   Status Achieved     PT SHORT TERM GOAL #4   Title Pt will decrease falls risk and will improve TUG to <11 seconds   Baseline MET 08/29/16; 10 seconds   Status Achieved           PT Long Term Goals - 08/29/16 1237      PT LONG TERM GOAL #1   Title (TARGET DATE FOR ALL LTG 09/28/16) Pt will report improvement in dizziness during day to day activities with a 18 point decrease in [place]   Baseline 22   Time 8   Period Weeks   Status New     PT LONG TERM GOAL #2   Title Pt will decrease falls risk as indicated by a increase in BERG balance score >5 points   Baseline 51/56; 55/56 on 3/21   Time 8   Period Weeks   Status On-going     PT LONG TERM GOAL #3   Title Pt will decrease falls risk and improve safety with gait as indicated by an increase in gait velocity by .51 ft/sec   Baseline 3.15f/sec; 3.49 ft/sec on 08/29/16   Time 8   Period Weeks   Status On-going     PT LONG TERM GOAL #4   Title Pt will ambulate safely x 1,000' over indoor and outdoor/uneven surfaces with LRAD Mod I   Time 8   Period Weeks   Status On-going     PT LONG TERM GOAL  #5   Title Pt will demonstrate improved LE strength as indicated by  decrease in five time sit <> stand time by 4 seconds   Baseline 14.53; 12.3 seconds on 08/29/16   Time 8   Period Weeks   Status New               Plan - 08/29/16 1238    Clinical Impression Statement Pt continues to make good progress as indicated by pt meeting all STG and partially meeting all LTG.  Pt's dizziness has resolved.  Pt demonstrates improvement in LE strength and static and dynamic balance and decreased falls risk.  Will continue to focus on functional LE strengthening, gait and dynamic balance to reach targeted LTG.     Clinical Impairments Affecting Rehab Potential progressive LE weakness, imbalance, falls-participating in neuro work up (possible parkinson's?), LE venous insufficiency, COPD, tremor   PT Treatment/Interventions ADLs/Self Care Home Management;Canalith Repostioning;DME Instruction;Gait training;Stair training;Functional mobility training;Therapeutic activities;Therapeutic exercise;Balance training;Neuromuscular re-education;Patient/family education;Vestibular   PT Next Visit Plan Standing PWR, LE strengthening, endurance, Continue higher level balance and gait training   Consulted and Agree with Plan of Care Patient      Patient will benefit from skilled therapeutic intervention in order to improve the following deficits and impairments:  Abnormal gait, Decreased balance, Decreased coordination, Decreased mobility, Decreased strength, Difficulty walking, Dizziness, Postural dysfunction  Visit Diagnosis: Dizziness and giddiness  Unsteadiness on feet  Repeated falls  Other abnormalities of gait and mobility     Problem List Patient Active Problem List   Diagnosis Date Noted  . GERD (gastroesophageal reflux disease) 08/10/2016  . Cough 08/10/2016  . Acute bronchitis 07/12/2016  . Bilateral carotid artery disease ([place]) 02/14/2016  . Chest pain 01/13/2016  . Osteoporosis  02/22/2015  . CKD (chronic kidney disease), stage III 08/16/2014  . Breast cancer of upper-outer quadrant of right female breast ([place]) 03/22/2014  . Dizziness and giddiness 11/27/2012  . Essential tremor 11/27/2012  . Numbness 11/27/2012  . TOBACCO USER 10/04/2009  . CLOSED FRACTURE OF ONE RIB 07/12/2009  . HYPERLIPIDEMIA TYPE IIB / III 09/28/2008  . CAD, NATIVE VESSEL 09/28/2008  . Rhinitis 09/09/2008  . EMPHYSEMA 09/07/2008  . Essential hypertension 09/06/2008  . SKIN CANCER, HX OF 09/06/2008   Raylene Everts, PT, DPT 08/29/16    12:44 PM    [facility] [address] [place], [place], [address] Phone: [phone]   Fax:  [phone]  Name: Brittany Kirk MRN: [id number] Date of Birth: [birth year]

## 2016-09-05 ENCOUNTER — Ambulatory Visit: Payer: Medicare Other | Admitting: Physical Therapy

## 2016-09-05 ENCOUNTER — Encounter: Payer: Self-pay | Admitting: Physical Therapy

## 2016-09-05 DIAGNOSIS — R296 Repeated falls: Secondary | ICD-10-CM

## 2016-09-05 DIAGNOSIS — R2689 Other abnormalities of gait and mobility: Secondary | ICD-10-CM | POA: Diagnosis not present

## 2016-09-05 DIAGNOSIS — R2681 Unsteadiness on feet: Secondary | ICD-10-CM

## 2016-09-05 DIAGNOSIS — R42 Dizziness and giddiness: Secondary | ICD-10-CM

## 2016-09-05 NOTE — Therapy (Signed)
[facility] [address] [place], [place], [address] Phone: [phone]   Fax:  [phone]  Physical Therapy Treatment  Patient Details  Name: Brittany Kirk MRN: [id number] Date of Birth: 1945-06-16 Referring Provider: Penni Bombard, MD  Encounter Date: 09/05/2016      PT End of Session - 09/05/16 1402    Visit Number 6   Number of Visits 9   Date for PT Re-Evaluation 09/28/16   Authorization Type Medicare Part A and B; G code and Progress note every 10th visit   PT Start Time 1319   PT Stop Time 1402   PT Time Calculation (min) 43 min   Activity Tolerance Patient tolerated treatment well   Behavior During Therapy [facility] for tasks assessed/performed      Past Medical History:  Diagnosis Date  . Allergic rhinitis   . Asymptomatic bilateral carotid artery stenosis   . CAD (coronary artery disease)   . Cancer ([place])   . Complication of anesthesia    sore throat after a surgery  . Depression   . Emphysema    no longer uses inhalers  . Gallstones    nausea, pain upper right abdomen  . GERD (gastroesophageal reflux disease)   . H/O hiatal hernia   . History of skin cancer   . Hyperlipidemia   . Hypertension   . Shortness of breath    sob if walks up flight of stairs  . Tobacco user   . Wears dentures    top    Past Surgical History:  Procedure Laterality Date  . BREAST LUMPECTOMY Right 2015  . CHOLECYSTECTOMY  07/08/2012   Procedure: LAPAROSCOPIC CHOLECYSTECTOMY WITH INTRAOPERATIVE CHOLANGIOGRAM;  Surgeon: Gayland Curry, MD,FACS;  Location: [facility];  Service: General;  Laterality: N/A;  Laparoscopic Cholecystectomy with Intraoperative Cholangiogram  . COLONOSCOPY    . HAND SURGERY Left    wrist  . TONSILECTOMY, ADENOIDECTOMY, BILATERAL MYRINGOTOMY AND TUBES    . TUBAL LIGATION      There were no vitals filed for this visit.      Subjective Assessment - 09/05/16 1321    Subjective Dizziness  still resolved; has been doing leg press and stationary bike at home consistently.  Is sore in hamstring and calves today.   Pertinent History Breast Cancer precautions-RUE restricted with lymphedema; essential tremor, venous insufficiency, COPD, falls   Limitations Walking;Writing   How long can you walk comfortably? community distances   Patient Stated Goals better balance   Currently in Pain? No/denies            [facility] Adult PT Treatment/Exercise - 09/05/16 1341      Ambulation/Gait   Ambulation/Gait Yes   Ambulation/Gait Assistance 6: Modified independent (Device/Increase time)   Ambulation Distance (Feet) 1000 Feet   Assistive device None   Gait Pattern Poor foot clearance - right;Poor foot clearance - left   Ambulation Surface Outdoor;Paved;Gravel;Grass;Other (comment)  mulch   Gait Comments Performed gait outdoors over various uneven surfaces forwards and retro; as pt fatigued pt demonstrated slowed gait velocity and SOB-pt reporting hamstring soreness     Knee/Hip Exercises: Stretches   Active Hamstring Stretch Right;Left;2 reps;30 seconds  standing with heel on step, leaning over LE   Gastroc Stretch 2 reps;30 seconds;Both  standing on step, dropping heels down     Knee/Hip Exercises: Standing   Hip Flexion Stengthening;Right;Left;Knee straight;Other (comment)   Hip Flexion Limitations 12 reps with blue theraband, no UE support  Hip ADduction Strengthening;Right;Left;Other (comment)   Hip ADduction Limitations 12 reps with blue theraband, no UE support   Hip Abduction Stengthening;Right;Left;Knee straight;Other (comment)   Abduction Limitations 12 reps with blue theraband, no UE support   Hip Extension Stengthening;Right;Left;Knee straight;Other (comment)   Extension Limitations 12 reps with blue theraband, no UE support           PWR [facility]) - 09/05/16 1357    PWR! Rock x10   PWR! Twist x10   PWR Step x10   Comments improved weight shifting and stepping/foot  clearance today          Balance Exercises - 09/05/16 1343      Balance Exercises: Standing   SLS Eyes open;Solid surface;1 rep;10 secs  holding against resistance of blue theraband, R and LLE           PT Education - 09/05/16 1402    Education provided Yes   Education Details LE stretches   Person(s) Educated Patient   Methods Explanation;Demonstration   Comprehension Verbalized understanding;Returned demonstration          PT Short Term Goals - 08/29/16 1236      PT SHORT TERM GOAL #1   Title (TARGET DATE FOR ALL STG 08/29/16) Pt will demonstrate independence with balance and LE strengthening HEP   Baseline Met 08/29/16   Status Achieved     PT SHORT TERM GOAL #4   Title Pt will decrease falls risk and will improve TUG to <11 seconds   Baseline MET 08/29/16; 10 seconds   Status Achieved           PT Long Term Goals - 08/29/16 1237      PT LONG TERM GOAL #1   Title (TARGET DATE FOR ALL LTG 09/28/16) Pt will report improvement in dizziness during day to day activities with a 18 point decrease in [place]   Baseline 22   Time 8   Period Weeks   Status New     PT LONG TERM GOAL #2   Title Pt will decrease falls risk as indicated by a increase in BERG balance score >5 points   Baseline 51/56; 55/56 on 3/21   Time 8   Period Weeks   Status On-going     PT LONG TERM GOAL #3   Title Pt will decrease falls risk and improve safety with gait as indicated by an increase in gait velocity by .51 ft/sec   Baseline 3.65f/sec; 3.49 ft/sec on 08/29/16   Time 8   Period Weeks   Status On-going     PT LONG TERM GOAL #4   Title Pt will ambulate safely x 1,000' over indoor and outdoor/uneven surfaces with LRAD Mod I   Time 8   Period Weeks   Status On-going     PT LONG TERM GOAL #5   Title Pt will demonstrate improved LE strength as indicated by decrease in five time sit <> stand time by 4 seconds   Baseline 14.53; 12.3 seconds on 08/29/16   Time 8   Period Weeks    Status New               Plan - 09/05/16 1648    Clinical Impression Statement Pt continues to make good progress with improved LE strength, balance and weight shifting and is consistently performing exercises at home; pt was able to tolerate gait outside over various uneven surfaces but fatigued quickly and demonstrated decreased safety with gait and SOB.  Will continue  to address to reach targeted LTG.    Clinical Impairments Affecting Rehab Potential progressive LE weakness, imbalance, falls-participating in neuro work up (possible parkinson's?), LE venous insufficiency, COPD, tremor   PT Treatment/Interventions ADLs/Self Care Home Management;Canalith Repostioning;DME Instruction;Gait training;Stair training;Functional mobility training;Therapeutic activities;Therapeutic exercise;Balance training;Neuromuscular re-education;Patient/family education;Vestibular   PT Next Visit Plan Standing PWR, LE strengthening, endurance, Continue higher level balance and gait training   Consulted and Agree with Plan of Care Patient      Patient will benefit from skilled therapeutic intervention in order to improve the following deficits and impairments:  Abnormal gait, Decreased balance, Decreased coordination, Decreased mobility, Decreased strength, Difficulty walking, Dizziness, Postural dysfunction  Visit Diagnosis: Dizziness and giddiness  Unsteadiness on feet  Repeated falls  Other abnormalities of gait and mobility     Problem List Patient Active Problem List   Diagnosis Date Noted  . GERD (gastroesophageal reflux disease) 08/10/2016  . Cough 08/10/2016  . Acute bronchitis 07/12/2016  . Bilateral carotid artery disease ([place]) 02/14/2016  . Chest pain 01/13/2016  . Osteoporosis 02/22/2015  . CKD (chronic kidney disease), stage III 08/16/2014  . Breast cancer of upper-outer quadrant of right female breast ([place]) 03/22/2014  . Dizziness and giddiness 11/27/2012  . Essential tremor  11/27/2012  . Numbness 11/27/2012  . TOBACCO USER 10/04/2009  . CLOSED FRACTURE OF ONE RIB 07/12/2009  . HYPERLIPIDEMIA TYPE IIB / III 09/28/2008  . CAD, NATIVE VESSEL 09/28/2008  . Rhinitis 09/09/2008  . EMPHYSEMA 09/07/2008  . Essential hypertension 09/06/2008  . SKIN CANCER, HX OF 09/06/2008   Raylene Everts, PT, DPT 09/05/16    4:51 PM    [facility] [address] [place], [place], [address] Phone: [phone]   Fax:  [phone]  Name: DESIRAYE ROLFSON MRN: [id number] Date of Birth: [birth year]

## 2016-09-12 ENCOUNTER — Ambulatory Visit: Payer: Medicare Other | Attending: Diagnostic Neuroimaging | Admitting: Physical Therapy

## 2016-09-12 ENCOUNTER — Encounter: Payer: Self-pay | Admitting: Physical Therapy

## 2016-09-12 DIAGNOSIS — R2689 Other abnormalities of gait and mobility: Secondary | ICD-10-CM | POA: Diagnosis not present

## 2016-09-12 DIAGNOSIS — R2681 Unsteadiness on feet: Secondary | ICD-10-CM

## 2016-09-12 DIAGNOSIS — R296 Repeated falls: Secondary | ICD-10-CM | POA: Diagnosis not present

## 2016-09-12 DIAGNOSIS — R42 Dizziness and giddiness: Secondary | ICD-10-CM | POA: Diagnosis not present

## 2016-09-12 NOTE — Therapy (Signed)
[facility] [address] [place], [place], [address] Phone: [phone]   Fax:  [phone]  Physical Therapy Treatment  Patient Details  Name: Brittany Kirk MRN: [id number] Date of Birth: [birth year] Referring Provider: Penni Bombard, MD  Encounter Date: 09/12/2016      PT End of Session - 09/12/16 1400    Visit Number 7   Number of Visits 9   Date for PT Re-Evaluation 09/28/16   Authorization Type Medicare Part A and B; G code and Progress note every 10th visit   PT Start Time 1316   PT Stop Time 1400   PT Time Calculation (min) 44 min   Activity Tolerance Patient tolerated treatment well   Behavior During Therapy [facility] for tasks assessed/performed      Past Medical History:  Diagnosis Date  . Allergic rhinitis   . Asymptomatic bilateral carotid artery stenosis   . CAD (coronary artery disease)   . Cancer ([place])   . Complication of anesthesia    sore throat after a surgery  . Depression   . Emphysema    no longer uses inhalers  . Gallstones    nausea, pain upper right abdomen  . GERD (gastroesophageal reflux disease)   . H/O hiatal hernia   . History of skin cancer   . Hyperlipidemia   . Hypertension   . Shortness of breath    sob if walks up flight of stairs  . Tobacco user   . Wears dentures    top    Past Surgical History:  Procedure Laterality Date  . BREAST LUMPECTOMY Right 2015  . CHOLECYSTECTOMY  07/08/2012   Procedure: LAPAROSCOPIC CHOLECYSTECTOMY WITH INTRAOPERATIVE CHOLANGIOGRAM;  Surgeon: Gayland Curry, MD,FACS;  Location: [facility];  Service: General;  Laterality: N/A;  Laparoscopic Cholecystectomy with Intraoperative Cholangiogram  . COLONOSCOPY    . HAND SURGERY Left    wrist  . TONSILECTOMY, ADENOIDECTOMY, BILATERAL MYRINGOTOMY AND TUBES    . TUBAL LIGATION      There were no vitals filed for this visit.      Subjective Assessment - 09/12/16 1320    Subjective No issues  from last week; continues to use leg press and stationary bike with resolution of mm soreness.   Pertinent History Breast Cancer precautions-RUE restricted with lymphedema; essential tremor, venous insufficiency, COPD, falls   Limitations Walking;Writing   How long can you walk comfortably? community distances   Patient Stated Goals better balance   Currently in Pain? No/denies            [facility] Adult PT Treatment/Exercise - 09/12/16 1320      Knee/Hip Exercises: Aerobic   Nustep L5 x 10 minutes, bilat UE and LE + 2 minutes with LE only for strengthening           Balance Exercises - 09/12/16 1335      Balance Exercises: Standing   SLS Eyes open;Eyes closed;Foam/compliant surface;Upper extremity support 2;20 secs  rocker board   Wall Bumps Hip   Wall Bumps-Hips Eyes opened;Eyes closed;Anterior/posterior;Foam/compliant surface;Other reps (comment)  rocker board, 12 reps each   Rockerboard Anterior/posterior;EO;EC;Other reps (comment)  12 reps, bilat LE, SLS   Tandem Gait Forward;Retro;Upper extremity support;4 reps;Foam/compliant surface  foam balance beam           PT Education - 09/12/16 1359    Education provided Yes   Education Details POC and progress   Person(s) Educated Patient   Methods Explanation   Comprehension  Verbalized understanding          PT Short Term Goals - 08/29/16 1236      PT SHORT TERM GOAL #1   Title (TARGET DATE FOR ALL STG 08/29/16) Pt will demonstrate independence with balance and LE strengthening HEP   Baseline Met 08/29/16   Status Achieved     PT SHORT TERM GOAL #4   Title Pt will decrease falls risk and will improve TUG to <11 seconds   Baseline MET 08/29/16; 10 seconds   Status Achieved           PT Long Term Goals - 08/29/16 1237      PT LONG TERM GOAL #1   Title (TARGET DATE FOR ALL LTG 09/28/16) Pt will report improvement in dizziness during day to day activities with a 18 point decrease in [place]   Baseline 22   Time  8   Period Weeks   Status New     PT LONG TERM GOAL #2   Title Pt will decrease falls risk as indicated by a increase in BERG balance score >5 points   Baseline 51/56; 55/56 on 3/21   Time 8   Period Weeks   Status On-going     PT LONG TERM GOAL #3   Title Pt will decrease falls risk and improve safety with gait as indicated by an increase in gait velocity by .51 ft/sec   Baseline 3.53f/sec; 3.49 ft/sec on 08/29/16   Time 8   Period Weeks   Status On-going     PT LONG TERM GOAL #4   Title Pt will ambulate safely x 1,000' over indoor and outdoor/uneven surfaces with LRAD Mod I   Time 8   Period Weeks   Status On-going     PT LONG TERM GOAL #5   Title Pt will demonstrate improved LE strength as indicated by decrease in five time sit <> stand time by 4 seconds   Baseline 14.53; 12.3 seconds on 08/29/16   Time 8   Period Weeks   Status New               Plan - 09/12/16 1732    Clinical Impression Statement Treatment session today with increased focus on dynamic standing balance, weight shifting and ankle/hip balance reactions.  Pt tolerated well and able to perform both in vision allowed and vision deprived conditions.  Will continue to address impairments to reach targeted goals.    Clinical Impairments Affecting Rehab Potential progressive LE weakness, imbalance, falls-participating in neuro work up (possible parkinson's?), LE venous insufficiency, COPD, tremor   PT Treatment/Interventions ADLs/Self Care Home Management;Canalith Repostioning;DME Instruction;Gait training;Stair training;Functional mobility training;Therapeutic activities;Therapeutic exercise;Balance training;Neuromuscular re-education;Patient/family education;Vestibular   PT Next Visit Plan Standing PWR on variety of surfaces, LE strengthening, endurance, Continue higher level balance and gait training   PT Home Exercise Plan PWR!   Consulted and Agree with Plan of Care Patient      Patient will benefit  from skilled therapeutic intervention in order to improve the following deficits and impairments:  Abnormal gait, Decreased balance, Decreased coordination, Decreased mobility, Decreased strength, Difficulty walking, Dizziness, Postural dysfunction  Visit Diagnosis: Dizziness and giddiness  Unsteadiness on feet  Other abnormalities of gait and mobility  Repeated falls     Problem List Patient Active Problem List   Diagnosis Date Noted  . GERD (gastroesophageal reflux disease) 08/10/2016  . Cough 08/10/2016  . Acute bronchitis 07/12/2016  . Bilateral carotid artery disease ([place] 02/14/2016  .  Chest pain 01/13/2016  . Osteoporosis 02/22/2015  . CKD (chronic kidney disease), stage III 08/16/2014  . Breast cancer of upper-outer quadrant of right female breast ([place]) 03/22/2014  . Dizziness and giddiness 11/27/2012  . Essential tremor 11/27/2012  . Numbness 11/27/2012  . TOBACCO USER 10/04/2009  . CLOSED FRACTURE OF ONE RIB 07/12/2009  . HYPERLIPIDEMIA TYPE IIB / III 09/28/2008  . CAD, NATIVE VESSEL 09/28/2008  . Rhinitis 09/09/2008  . EMPHYSEMA 09/07/2008  . Essential hypertension 09/06/2008  . SKIN CANCER, HX OF 09/06/2008   Raylene Everts, PT, DPT 09/12/16    5:34 PM   [facility] [address] [place], [place], [address] Phone: [phone]   Fax:  [phone]  Name: DYSTANY DUFFY MRN: [id number] Date of Birth: 14-Jan-1946

## 2016-09-19 ENCOUNTER — Encounter: Payer: Self-pay | Admitting: Physical Therapy

## 2016-09-19 ENCOUNTER — Ambulatory Visit: Payer: Medicare Other | Admitting: Physical Therapy

## 2016-09-19 DIAGNOSIS — R296 Repeated falls: Secondary | ICD-10-CM | POA: Diagnosis not present

## 2016-09-19 DIAGNOSIS — R2681 Unsteadiness on feet: Secondary | ICD-10-CM

## 2016-09-19 DIAGNOSIS — R2689 Other abnormalities of gait and mobility: Secondary | ICD-10-CM

## 2016-09-19 DIAGNOSIS — R42 Dizziness and giddiness: Secondary | ICD-10-CM | POA: Diagnosis not present

## 2016-09-19 NOTE — Patient Instructions (Signed)
BACK: Child's Pose    Sit in knee-chest position and reach arms forward. Separate knees for comfort. Hold position for _5-6__ breaths. Repeat _2-3__ times. Do _2__ times per day or after performing leg press machine.  Copyright  VHI. All rights reserved.  Chair Sitting    Sit at edge of seat, spine straight, one leg extended. Put a hand on each thigh and bend forward from the hip, keeping spine straight. Allow hand on extended leg to reach toward toes. Support upper body with other arm. Hold _30_ seconds. Repeat with other leg, hold 30 seconds Repeat _2__ times per session per leg. Do _2__ sessions per day or after using leg press machine .  Copyright  VHI. All rights reserved.

## 2016-09-19 NOTE — Therapy (Signed)
[facility] [address] [place], [place], [address] Phone: [phone]   Fax:  [phone]  Physical Therapy Treatment  Patient Details  Name: Brittany Kirk MRN: [id number] Date of Birth: 1946/03/03 Referring Provider: Penni Bombard, MD  Encounter Date: 09/19/2016      PT End of Session - 09/19/16 1351    Visit Number 8   Number of Visits 9   Date for PT Re-Evaluation 09/28/16   Authorization Type Medicare Part A and B; G code and Progress note every 10th visit   PT Start Time 1318   PT Stop Time 1358   PT Time Calculation (min) 40 min   Activity Tolerance Patient tolerated treatment well   Behavior During Therapy [facility] for tasks assessed/performed      Past Medical History:  Diagnosis Date  . Allergic rhinitis   . Asymptomatic bilateral carotid artery stenosis   . CAD (coronary artery disease)   . Cancer ([place])   . Complication of anesthesia    sore throat after a surgery  . Depression   . Emphysema    no longer uses inhalers  . Gallstones    nausea, pain upper right abdomen  . GERD (gastroesophageal reflux disease)   . H/O hiatal hernia   . History of skin cancer   . Hyperlipidemia   . Hypertension   . Shortness of breath    sob if walks up flight of stairs  . Tobacco user   . Wears dentures    top    Past Surgical History:  Procedure Laterality Date  . BREAST LUMPECTOMY Right 2015  . CHOLECYSTECTOMY  07/08/2012   Procedure: LAPAROSCOPIC CHOLECYSTECTOMY WITH INTRAOPERATIVE CHOLANGIOGRAM;  Surgeon: Gayland Curry, MD,FACS;  Location: [facility];  Service: General;  Laterality: N/A;  Laparoscopic Cholecystectomy with Intraoperative Cholangiogram  . COLONOSCOPY    . HAND SURGERY Left    wrist  . TONSILECTOMY, ADENOIDECTOMY, BILATERAL MYRINGOTOMY AND TUBES    . TUBAL LIGATION      There were no vitals filed for this visit.      Subjective Assessment - 09/19/16 1322    Subjective Pt reports  husband increased weight on leg press machine and reports increased soreness today   Pertinent History Breast Cancer precautions-RUE restricted with lymphedema; essential tremor, venous insufficiency, COPD, falls   Limitations Walking;Writing   How long can you walk comfortably? community distances   Patient Stated Goals better balance   Currently in Pain? No/denies            [facility] Adult PT Treatment/Exercise - 09/19/16 1323      High Level Balance   High Level Balance Activities Turns;Sudden stops;Marching forwards;Marching backwards;Backward walking   High Level Balance Comments 90 deg and 180 deg x 5 reps each and to R/L sides while marching in place on compliant surface.  Also performed forwards and retro gait on compliant surface with R and L 180 deg turns.  Forwards and retro marching/gait with eyes closed on compliant surface x 5 reps each with min HHA.     Knee/Hip Exercises: Stretches   Active Hamstring Stretch Left;Right;1 rep;30 seconds   Active Hamstring Stretch Limitations foot propped up on stool   Quad Stretch Right;Left;2 reps;30 seconds   Quad Stretch Limitations hips back on heels in child's pose     Knee/Hip Exercises: Aerobic   Nustep L6 with bilat UE and LE x 8 minutes + 2 minutes at L4 for cool down  PT Education - 09/19/16 1346    Education provided Yes   Education Details quad and hamstring stretches after performing leg press at home   Person(s) Educated Patient   Methods Explanation;Demonstration;Handout   Comprehension Verbalized understanding;Returned demonstration          PT Short Term Goals - 08/29/16 1236      PT SHORT TERM GOAL #1   Title (TARGET DATE FOR ALL STG 08/29/16) Pt will demonstrate independence with balance and LE strengthening HEP   Baseline Met 08/29/16   Status Achieved     PT SHORT TERM GOAL #4   Title Pt will decrease falls risk and will improve TUG to <11 seconds   Baseline MET 08/29/16; 10 seconds    Status Achieved           PT Long Term Goals - 08/29/16 1237      PT LONG TERM GOAL #1   Title (TARGET DATE FOR ALL LTG 09/28/16) Pt will report improvement in dizziness during day to day activities with a 18 point decrease in [place]   Baseline 22   Time 8   Period Weeks   Status New     PT LONG TERM GOAL #2   Title Pt will decrease falls risk as indicated by a increase in BERG balance score >5 points   Baseline 51/56; 55/56 on 3/21   Time 8   Period Weeks   Status On-going     PT LONG TERM GOAL #3   Title Pt will decrease falls risk and improve safety with gait as indicated by an increase in gait velocity by .51 ft/sec   Baseline 3.78f/sec; 3.49 ft/sec on 08/29/16   Time 8   Period Weeks   Status On-going     PT LONG TERM GOAL #4   Title Pt will ambulate safely x 1,000' over indoor and outdoor/uneven surfaces with LRAD Mod I   Time 8   Period Weeks   Status On-going     PT LONG TERM GOAL #5   Title Pt will demonstrate improved LE strength as indicated by decrease in five time sit <> stand time by 4 seconds   Baseline 14.53; 12.3 seconds on 08/29/16   Time 8   Period Weeks   Status New               Plan - 09/19/16 1353    Clinical Impression Statement Treatment session focused on more dynamic gait and balance with head turns, body turns, compliant surface and vision deprived challenges.  Pt educated on LE stretches for HEP to add after performing leg press machine at home.  Pt continues to make good progress and will likely re-assess LTG and D/C at next visit.   Clinical Impairments Affecting Rehab Potential progressive LE weakness, imbalance, falls-participating in neuro work up (possible parkinson's?), LE venous insufficiency, COPD, tremor   PT Treatment/Interventions ADLs/Self Care Home Management;Canalith Repostioning;DME Instruction;Gait training;Stair training;Functional mobility training;Therapeutic activities;Therapeutic exercise;Balance  training;Neuromuscular re-education;Patient/family education;Vestibular   PT Next Visit Plan re-assess LTG, D/C FOTO and D/C from therapy   Consulted and Agree with Plan of Care Patient      Patient will benefit from skilled therapeutic intervention in order to improve the following deficits and impairments:  Abnormal gait, Decreased balance, Decreased coordination, Decreased mobility, Decreased strength, Difficulty walking, Dizziness, Postural dysfunction  Visit Diagnosis: Dizziness and giddiness  Unsteadiness on feet  Other abnormalities of gait and mobility  Repeated falls     Problem List Patient  Active Problem List   Diagnosis Date Noted  . GERD (gastroesophageal reflux disease) 08/10/2016  . Cough 08/10/2016  . Acute bronchitis 07/12/2016  . Bilateral carotid artery disease ([place]) 02/14/2016  . Chest pain 01/13/2016  . Osteoporosis 02/22/2015  . CKD (chronic kidney disease), stage III 08/16/2014  . Breast cancer of upper-outer quadrant of right female breast ([place]) 03/22/2014  . Dizziness and giddiness 11/27/2012  . Essential tremor 11/27/2012  . Numbness 11/27/2012  . TOBACCO USER 10/04/2009  . CLOSED FRACTURE OF ONE RIB 07/12/2009  . HYPERLIPIDEMIA TYPE IIB / III 09/28/2008  . CAD, NATIVE VESSEL 09/28/2008  . Rhinitis 09/09/2008  . EMPHYSEMA 09/07/2008  . Essential hypertension 09/06/2008  . SKIN CANCER, HX OF 09/06/2008    Raylene Everts, PT, DPT 09/19/16    1:59 PM    [facility] [address] [place], [place], [address] Phone: [phone]   Fax:  [phone]  Name: Brittany Kirk MRN: [id number] Date of Birth: 1945/12/10

## 2016-09-24 DIAGNOSIS — L570 Actinic keratosis: Secondary | ICD-10-CM | POA: Diagnosis not present

## 2016-09-24 DIAGNOSIS — Z85828 Personal history of other malignant neoplasm of skin: Secondary | ICD-10-CM | POA: Diagnosis not present

## 2016-09-26 ENCOUNTER — Encounter: Payer: Self-pay | Admitting: Physical Therapy

## 2016-09-26 ENCOUNTER — Ambulatory Visit: Payer: Medicare Other | Admitting: Physical Therapy

## 2016-09-26 DIAGNOSIS — R2689 Other abnormalities of gait and mobility: Secondary | ICD-10-CM | POA: Diagnosis not present

## 2016-09-26 DIAGNOSIS — R296 Repeated falls: Secondary | ICD-10-CM

## 2016-09-26 DIAGNOSIS — R42 Dizziness and giddiness: Secondary | ICD-10-CM

## 2016-09-26 DIAGNOSIS — R2681 Unsteadiness on feet: Secondary | ICD-10-CM | POA: Diagnosis not present

## 2016-09-26 NOTE — Therapy (Signed)
[facility] [address] [place], [place], [address] Phone: [phone]   Fax:  [phone]  Physical Therapy Treatment  Patient Details  Name: Brittany Kirk MRN: [id number] Date of Birth: [birth year] Referring Provider: Penni Bombard, MD  Encounter Date: 09/26/2016      PT End of Session - 09/26/16 1356    Visit Number 9   Number of Visits 9   Date for PT Re-Evaluation 09/28/16   Authorization Type Medicare Part A and B; G code and Progress note every 10th visit   PT Start Time 1320   PT Stop Time 1400   PT Time Calculation (min) 40 min   Activity Tolerance Patient tolerated treatment well   Behavior During Therapy [facility] for tasks assessed/performed      Past Medical History:  Diagnosis Date  . Allergic rhinitis   . Asymptomatic bilateral carotid artery stenosis   . CAD (coronary artery disease)   . Cancer ([place])   . Complication of anesthesia    sore throat after a surgery  . Depression   . Emphysema    no longer uses inhalers  . Gallstones    nausea, pain upper right abdomen  . GERD (gastroesophageal reflux disease)   . H/O hiatal hernia   . History of skin cancer   . Hyperlipidemia   . Hypertension   . Shortness of breath    sob if walks up flight of stairs  . Tobacco user   . Wears dentures    top    Past Surgical History:  Procedure Laterality Date  . BREAST LUMPECTOMY Right 2015  . CHOLECYSTECTOMY  07/08/2012   Procedure: LAPAROSCOPIC CHOLECYSTECTOMY WITH INTRAOPERATIVE CHOLANGIOGRAM;  Surgeon: Gayland Curry, MD,FACS;  Location: [facility];  Service: General;  Laterality: N/A;  Laparoscopic Cholecystectomy with Intraoperative Cholangiogram  . COLONOSCOPY    . HAND SURGERY Left    wrist  . TONSILECTOMY, ADENOIDECTOMY, BILATERAL MYRINGOTOMY AND TUBES    . TUBAL LIGATION      There were no vitals filed for this visit.      Subjective Assessment - 09/26/16 1324    Subjective Pt reports  legs feeling much better; still performing leg press machine and exercises at home.   Pertinent History Breast Cancer precautions-RUE restricted with lymphedema; essential tremor, venous insufficiency, COPD, falls   Limitations Walking;Writing   How long can you walk comfortably? community distances   Patient Stated Goals better balance   Currently in Pain? No/denies            [facility] PT Assessment - 09/26/16 1333      Observation/Other Assessments   Focus on Therapeutic Outcomes (FOTO)  79% (21% limited)   Other Surveys  Other Surveys   Dizziness Handicap Inventory [facility])  did not complete at D/C     Ambulation/Gait   Ambulation/Gait Yes   Ambulation/Gait Assistance 6: Modified independent (Device/Increase time)   Ambulation Distance (Feet) 1000 Feet   Assistive device None   Gait Pattern Poor foot clearance - right;Poor foot clearance - left   Ambulation Surface Unlevel;Outdoor;Paved;Gravel;Grass     Standardized Balance Assessment   Standardized Balance Assessment Berg Balance Test;Five Times Sit to Stand;10 meter walk test   Five times sit to stand comments  9.68   10 Meter Walk 9.10 seconds or 3.6 ft/sec     Berg Balance Test   Sit to Stand Able to stand without using hands and stabilize independently   Standing Unsupported Able to  stand safely 2 minutes   Sitting with Back Unsupported but Feet Supported on Floor or Stool Able to sit safely and securely 2 minutes   Stand to Sit Sits safely with minimal use of hands   Transfers Able to transfer safely, minor use of hands   Standing Unsupported with Eyes Closed Able to stand 10 seconds safely   Standing Ubsupported with Feet Together Able to place feet together independently and stand 1 minute safely   From Standing, Reach Forward with Outstretched Arm Can reach confidently >25 cm (10")   From Standing Position, Pick up Object from Floor Able to pick up shoe safely and easily   From Standing Position, Turn to Look Behind  Over each Shoulder Looks behind from both sides and weight shifts well   Turn 360 Degrees Able to turn 360 degrees safely in 4 seconds or less   Standing Unsupported, Alternately Place Feet on Step/Stool Able to stand independently and safely and complete 8 steps in 20 seconds   Standing Unsupported, One Foot in [place] to place foot tandem independently and hold 30 seconds   Standing on One Leg Able to lift leg independently and hold > 10 seconds   Total Score 56   Berg comment: 56/56 low falls risk                             PT Education - 09/26/16 1355    Education provided Yes   Education Details goals met, progress and D/C from therapy   Person(s) Educated Patient   Methods Explanation   Comprehension Verbalized understanding          PT Short Term Goals - 08/29/16 1236      PT SHORT TERM GOAL #1   Title (TARGET DATE FOR ALL STG 08/29/16) Pt will demonstrate independence with balance and LE strengthening HEP   Baseline Met 08/29/16   Status Achieved     PT SHORT TERM GOAL #4   Title Pt will decrease falls risk and will improve TUG to <11 seconds   Baseline MET 08/29/16; 10 seconds   Status Achieved           PT Long Term Goals - 09/26/16 1356      PT LONG TERM GOAL #1   Title (TARGET DATE FOR ALL LTG 09/28/16) Pt will report improvement in dizziness during day to day activities with a 18 point decrease in [place]   Baseline 22   Time 8   Period Weeks   Status Unable to assess     PT LONG TERM GOAL #2   Title Pt will decrease falls risk as indicated by a increase in BERG balance score >5 points   Baseline 51/56; 55/56 on 3/21; 56/56 on 4/18   Time 8   Period Weeks   Status Achieved     PT LONG TERM GOAL #3   Title Pt will decrease falls risk and improve safety with gait as indicated by an increase in gait velocity by .51 ft/sec   Baseline 3.25f/sec; 3.49 ft/sec on 08/29/16; 3.683fsec on 4/18   Time 8   Period Weeks   Status Achieved      PT LONG TERM GOAL #4   Title Pt will ambulate safely x 1,000' over indoor and outdoor/uneven surfaces with LRAD Mod I   Time 8   Period Weeks   Status Achieved     PT LONG TERM GOAL #5  Title Pt will demonstrate improved LE strength as indicated by decrease in five time sit <> stand time by 4 seconds   Baseline 14.53; 12.3 seconds on 08/29/16; 9 seconds on 09-28-22   Time 8   Period Weeks   Status Achieved               Plan - 09/27/2016 1358    Clinical Impression Statement Pt has made good progress and has met 4/5 LTG with improvements in LE strength, balance and safety with gait.  Unable to re-assess [place] due to FOTO did not prompt pt to answer [place] questions but pt reporting full resolution of dizziness and no limitations now due to dizziness. Due to pt progress and independence with HEP pt will D/C from therapy today.  Pt verbalized agreement.   Clinical Impairments Affecting Rehab Potential progressive LE weakness, imbalance, falls-participating in neuro work up (possible parkinson's?), LE venous insufficiency, COPD, tremor   PT Treatment/Interventions ADLs/Self Care Home Management;Canalith Repostioning;DME Instruction;Gait training;Stair training;Functional mobility training;Therapeutic activities;Therapeutic exercise;Balance training;Neuromuscular re-education;Patient/family education;Vestibular   PT Next Visit Plan D/C today   Consulted and Agree with Plan of Care Patient      Patient will benefit from skilled therapeutic intervention in order to improve the following deficits and impairments:  Abnormal gait, Decreased balance, Decreased coordination, Decreased mobility, Decreased strength, Difficulty walking, Dizziness, Postural dysfunction  Visit Diagnosis: Dizziness and giddiness  Unsteadiness on feet  Other abnormalities of gait and mobility  Repeated falls       G-Codes - 2016-09-27 1430    Functional Assessment Tool Used (Outpatient Only) 56/56   Functional  Limitation Mobility: Walking and moving around   Mobility: Walking and Moving Around Goal Status [phone]) At least 1 percent but less than 20 percent impaired, limited or restricted   Mobility: Walking and Moving Around Discharge Status [phone]) 0 percent impaired, limited or restricted   Functional Assessment Tool Used Berg      Problem List Patient Active Problem List   Diagnosis Date Noted  . GERD (gastroesophageal reflux disease) 08/10/2016  . Cough 08/10/2016  . Acute bronchitis 07/12/2016  . Bilateral carotid artery disease ([place]) 02/14/2016  . Chest pain 01/13/2016  . Osteoporosis 02/22/2015  . CKD (chronic kidney disease), stage III 08/16/2014  . Breast cancer of upper-outer quadrant of right female breast ([place]) 03/22/2014  . Dizziness and giddiness 11/27/2012  . Essential tremor 11/27/2012  . Numbness 11/27/2012  . TOBACCO USER 10/04/2009  . CLOSED FRACTURE OF ONE RIB 07/12/2009  . HYPERLIPIDEMIA TYPE IIB / III 09/28/2008  . CAD, NATIVE VESSEL 09/28/2008  . Rhinitis 09/09/2008  . EMPHYSEMA 09/07/2008  . Essential hypertension 09/06/2008  . SKIN CANCER, HX OF 09/06/2008   PHYSICAL THERAPY DISCHARGE SUMMARY  Visits from Start of Care: 9  Current functional level related to goals / functional outcomes: See goals above   Remaining deficits: Impaired balance during gait on compliant surfaces, endurance   Education / Equipment: HEP  Plan: Patient agrees to discharge.  Patient goals were met. Patient is being discharged due to meeting the stated rehab goals.  ?????    Raylene Everts, PT, DPT September 27, 2016    2:32 PM    [facility] [address] [place], [place], [address] Phone: [phone]   Fax:  [phone]  Name: AMEISHA MCCLELLAN MRN: [id number] Date of Birth: 11-11-1945

## 2016-10-03 ENCOUNTER — Encounter: Payer: Medicare Other | Admitting: Physical Therapy

## 2016-10-24 DIAGNOSIS — R110 Nausea: Secondary | ICD-10-CM | POA: Diagnosis not present

## 2016-10-24 DIAGNOSIS — R634 Abnormal weight loss: Secondary | ICD-10-CM | POA: Diagnosis not present

## 2016-10-25 ENCOUNTER — Encounter: Payer: Self-pay | Admitting: Family Medicine

## 2016-10-25 ENCOUNTER — Ambulatory Visit (INDEPENDENT_AMBULATORY_CARE_PROVIDER_SITE_OTHER): Payer: Medicare Other | Admitting: Family Medicine

## 2016-10-25 VITALS — BP 100/64 | HR 64 | Temp 97.9°F | Resp 16 | Ht 61.0 in | Wt 148.0 lb

## 2016-10-25 DIAGNOSIS — S60571A Other superficial bite of hand of right hand, initial encounter: Secondary | ICD-10-CM

## 2016-10-25 DIAGNOSIS — W5501XA Bitten by cat, initial encounter: Secondary | ICD-10-CM

## 2016-10-25 MED ORDER — DOXYCYCLINE HYCLATE 100 MG PO TABS: 100.0000 mg | ORAL_TABLET | Freq: Two times a day (BID) | ORAL | 0 refills | Status: DC

## 2016-10-25 NOTE — Progress Notes (Signed)
Subjective:    Patient ID: Brittany Kirk, female    DOB: 1945/11/06, 71 y.o.   MRN: [id number]  HPI Patient is a 71 year old white female who has a history of lymphedema in the right upper extremity secondary to surgery for breast cancer. Recently she was bitten on the dorsum of her right hand by her cat accidentally. Earlier this week there was streaking erythema radiating up her right forearm from where the cat had bitten her. The erythema has improved but she still reports more swelling in the right forearm than usual. She reports pain in the right forearm. She also reports warmth in the right forearm. She denies any fevers or chills or signs of systemic illness. She has a history of an anaphylactic reaction to penicillin. She cannot tolerate Flagyl. She is also currently experiencing significant nausea and vomiting making clindamycin risky. Past Medical History:  Diagnosis Date  . Allergic rhinitis   . Asymptomatic bilateral carotid artery stenosis   . CAD (coronary artery disease)   . Cancer ([place])   . Complication of anesthesia    sore throat after a surgery  . Depression   . Emphysema    no longer uses inhalers  . Gallstones    nausea, pain upper right abdomen  . GERD (gastroesophageal reflux disease)   . H/O hiatal hernia   . History of skin cancer   . Hyperlipidemia   . Hypertension   . Shortness of breath    sob if walks up flight of stairs  . Tobacco user   . Wears dentures    top   Past Surgical History:  Procedure Laterality Date  . BREAST LUMPECTOMY Right 2015  . CHOLECYSTECTOMY  07/08/2012   Procedure: LAPAROSCOPIC CHOLECYSTECTOMY WITH INTRAOPERATIVE CHOLANGIOGRAM;  Surgeon: Gayland Curry, MD,FACS;  Location: [facility];  Service: General;  Laterality: N/A;  Laparoscopic Cholecystectomy with Intraoperative Cholangiogram  . COLONOSCOPY    . HAND SURGERY Left    wrist  . TONSILECTOMY, ADENOIDECTOMY, BILATERAL MYRINGOTOMY AND TUBES    . TUBAL LIGATION     Current  Outpatient Prescriptions on File Prior to Visit  Medication Sig Dispense Refill  . anastrozole (ARIMIDEX) 1 MG tablet Take 1 tablet (1 mg total) by mouth daily. 90 tablet 4  . aspirin 81 MG tablet Take 81 mg by mouth daily.    . Cholecalciferol (VITAMIN D3) 5000 UNITS CAPS Take 5,000 Units by mouth daily.    . cyanocobalamin 2000 MCG tablet Take 2,000 mcg by mouth daily. B12 slow release tablet daily    . diazepam (VALIUM) 5 MG tablet TAKE 1 TABLET BY MOUTH AT BEDTIME AS NEEDED FOR ANXIETY  3  . escitalopram (LEXAPRO) 20 MG tablet Take 20 mg by mouth daily.    Marland Kitchen esomeprazole (NEXIUM) 40 MG capsule Take 40 mg by mouth daily at 12 noon.    Marland Kitchen HYDROcodone-acetaminophen (NORCO) 5-325 MG tablet Take 1 tablet by mouth every 6 (six) hours as needed for moderate pain. 30 tablet 0  . losartan (COZAAR) 50 MG tablet TAKE 1 TABLET EVERY DAY (Patient taking differently: Only taking 25mg ) 90 tablet 3  . Multiple Vitamin (MULTIVITAMIN) tablet Take 1 tablet by mouth daily.    . mupirocin ointment (BACTROBAN) 2 % Apply to affected site 2 times daily 22 g 0  . Tiotropium Bromide Monohydrate (SPIRIVA RESPIMAT) 1.25 MCG/ACT AERS Inhale 2 puffs into the lungs daily. 1 Inhaler 0  . vitamin E 200 UNIT capsule Take 200 Units by mouth daily.    Marland Kitchen  Vitamin Mixture (VITAMIN C) LIQD Take 2 sprays by mouth daily.    Marland Kitchen albuterol (PROVENTIL HFA;VENTOLIN HFA) 108 (90 Base) MCG/ACT inhaler Inhale 2 puffs into the lungs every 6 (six) hours as needed for wheezing or shortness of breath. (Patient not taking: Reported on 10/25/2016) 1 Inhaler 2   No current facility-administered medications on file prior to visit.    Allergies  Allergen Reactions  . Penicillins Anaphylaxis, Itching, Swelling and Rash    Has patient had a PCN reaction causing immediate rash, facial/tongue/throat swelling, SOB or lightheadedness with hypotension: Yes Has patient had a PCN reaction causing severe rash involving mucus membranes or skin necrosis:  No Has patient had a PCN reaction that required hospitalization: Unk Has patient had a PCN reaction occurring within the last 10 years: No If all of the above answers are "NO", then may proceed with Cephalosporin use.   . Cefaclor Itching, Swelling and Rash  . Latex Itching and Rash  . Metronidazole Itching, Swelling and Rash  . Naproxen Itching, Swelling and Rash  . Nsaids Itching, Swelling and Rash    Can take advil  . Septra [Bactrim] Itching, Swelling and Rash  . Sulfonamide Derivatives Itching, Swelling and Rash  . Tape Itching and Rash   Social History   Social History  . Marital status: Married    Spouse name: N/A  . Number of children: 1  . Years of education: N/A   Occupational History  . 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  Retired   Social History Main Topics  . Smoking status: Former Smoker    Packs/day: 0.50    Years: 40.00    Types: Cigarettes    Quit date: 02/13/2011  . Smokeless tobacco: Never Used  . Alcohol use No  . Drug use: No  . Sexual activity: Not on file   Other Topics Concern  . Not on file   Social History Narrative  . No narrative on file      Review of Systems  All other systems reviewed and are negative.      Objective:   Physical Exam  Constitutional: She appears well-developed and well-nourished.  Cardiovascular: Normal rate, regular rhythm and normal heart sounds.   Pulmonary/Chest: Effort normal and breath sounds normal. No respiratory distress. She has no wheezes. She has no rales.  Musculoskeletal: She exhibits edema.  Skin: Skin is warm.   See HPI       Assessment & Plan:  Cat bite, initial encounter - Plan: doxycycline (VIBRA-TABS) 100 MG tablet  I will err on the side of caution and start the patient on doxycycline 100 mg by mouth twice a day for 10 days to cover Pasteurella multocida. She cannot tolerate Flagyl. The only other alternative I have to cover anaerobes with MB clindamycin however I hesitate to start the  patient on clindamycin given the fact that she's having nausea and vomiting at the present time. Therefore I will start her on doxycycline and monitor her clinically. If the swelling worsens, consider imaging of the right forearm to rule out a deep tissue infection/abscess.

## 2016-10-29 ENCOUNTER — Ambulatory Visit: Payer: Medicare Other | Admitting: Diagnostic Neuroimaging

## 2016-11-08 ENCOUNTER — Ambulatory Visit (INDEPENDENT_AMBULATORY_CARE_PROVIDER_SITE_OTHER): Payer: Medicare Other | Admitting: Family Medicine

## 2016-11-08 ENCOUNTER — Encounter: Payer: Self-pay | Admitting: Family Medicine

## 2016-11-08 VITALS — BP 122/70 | HR 76 | Temp 98.1°F | Resp 16 | Ht 61.0 in | Wt 146.0 lb

## 2016-11-08 DIAGNOSIS — R110 Nausea: Secondary | ICD-10-CM | POA: Diagnosis not present

## 2016-11-08 DIAGNOSIS — R5382 Chronic fatigue, unspecified: Secondary | ICD-10-CM | POA: Diagnosis not present

## 2016-11-08 LAB — TSH: TSH: 0.36 mIU/L — ABNORMAL LOW

## 2016-11-08 NOTE — Progress Notes (Signed)
Subjective:    Patient ID: Brittany Kirk, female    DOB: 1945/08/22, 71 y.o.   MRN: [id number]  HPI Patient presents today complaining chronic daily nausea for 2 months.  She states that she becomes extremely nauseated whenever she eats. Symptoms began roughly 2 months ago after a melanotic stool. This occurred one time and then resolve spontaneously however the nausea has persisted. Compared to her weight in October, she has lost 9 pounds. Patient states that she's not able to eat any solid food. She has to force herself to eat. However she is not vomiting. She denies any diarrhea. She denies any constipation. She denies any change in her bowel movements. She denies any fevers or chills. She has a history of cholecystectomy. She saw her gastroenterologist who reportedly told the patient that it was not due to an abnormality in her gastrointestinal tract. Patient states that labs were drawn but no other tests were performed. She states that the nausea is getting worse. She is on Lexapro 20 mg a day for the last year secondary to depression. She began the medication after her husband had an affair. However the relationship is stable now. She does not think that anxiety or depression is causing her nausea. She has not had any recent change in her medications. Colonoscopy in 2014 was normal. Patient had a CAT scan of the abdomen and pelvis that showed gallstones in 2014 but no other abnormalities. I do not see a recent EGD. She is tried over-the-counter acid reflux medication without benefit. Her gastroenterologist tried her on Zofran without benefit Past Medical History:  Diagnosis Date  . Allergic rhinitis   . Asymptomatic bilateral carotid artery stenosis   . CAD (coronary artery disease)   . Cancer ([place])   . Complication of anesthesia    sore throat after a surgery  . Depression   . Emphysema    no longer uses inhalers  . Gallstones    nausea, pain upper right abdomen  . GERD  (gastroesophageal reflux disease)   . H/O hiatal hernia   . History of skin cancer   . Hyperlipidemia   . Hypertension   . Shortness of breath    sob if walks up flight of stairs  . Tobacco user   . Wears dentures    top   Past Surgical History:  Procedure Laterality Date  . BREAST LUMPECTOMY Right 2015  . CHOLECYSTECTOMY  07/08/2012   Procedure: LAPAROSCOPIC CHOLECYSTECTOMY WITH INTRAOPERATIVE CHOLANGIOGRAM;  Surgeon: Gayland Curry, MD,FACS;  Location: [facility];  Service: General;  Laterality: N/A;  Laparoscopic Cholecystectomy with Intraoperative Cholangiogram  . COLONOSCOPY    . HAND SURGERY Left    wrist  . TONSILECTOMY, ADENOIDECTOMY, BILATERAL MYRINGOTOMY AND TUBES    . TUBAL LIGATION     Current Outpatient Prescriptions on File Prior to Visit  Medication Sig Dispense Refill  . albuterol (PROVENTIL HFA;VENTOLIN HFA) 108 (90 Base) MCG/ACT inhaler Inhale 2 puffs into the lungs every 6 (six) hours as needed for wheezing or shortness of breath. (Patient not taking: Reported on 10/25/2016) 1 Inhaler 2  . anastrozole (ARIMIDEX) 1 MG tablet Take 1 tablet (1 mg total) by mouth daily. 90 tablet 4  . aspirin 81 MG tablet Take 81 mg by mouth daily.    . Cholecalciferol (VITAMIN D3) 5000 UNITS CAPS Take 5,000 Units by mouth daily.    . cyanocobalamin 2000 MCG tablet Take 2,000 mcg by mouth daily. B12 slow release tablet daily    .  diazepam (VALIUM) 5 MG tablet TAKE 1 TABLET BY MOUTH AT BEDTIME AS NEEDED FOR ANXIETY  3  . escitalopram (LEXAPRO) 20 MG tablet Take 20 mg by mouth daily.    Marland Kitchen esomeprazole (NEXIUM) 40 MG capsule Take 40 mg by mouth daily at 12 noon.    Marland Kitchen HYDROcodone-acetaminophen (NORCO) 5-325 MG tablet Take 1 tablet by mouth every 6 (six) hours as needed for moderate pain. 30 tablet 0  . losartan (COZAAR) 50 MG tablet TAKE 1 TABLET EVERY DAY (Patient taking differently: Only taking 25mg ) 90 tablet 3  . Multiple Vitamin (MULTIVITAMIN) tablet Take 1 tablet by mouth daily.    .  mupirocin ointment (BACTROBAN) 2 % Apply to affected site 2 times daily 22 g 0  . Tiotropium Bromide Monohydrate (SPIRIVA RESPIMAT) 1.25 MCG/ACT AERS Inhale 2 puffs into the lungs daily. 1 Inhaler 0  . vitamin E 200 UNIT capsule Take 200 Units by mouth daily.    . Vitamin Mixture (VITAMIN C) LIQD Take 2 sprays by mouth daily.     No current facility-administered medications on file prior to visit.    Allergies  Allergen Reactions  . Penicillins Anaphylaxis, Itching, Swelling and Rash    Has patient had a PCN reaction causing immediate rash, facial/tongue/throat swelling, SOB or lightheadedness with hypotension: Yes Has patient had a PCN reaction causing severe rash involving mucus membranes or skin necrosis: No Has patient had a PCN reaction that required hospitalization: Unk Has patient had a PCN reaction occurring within the last 10 years: No If all of the above answers are "NO", then may proceed with Cephalosporin use.   . Cefaclor Itching, Swelling and Rash  . Latex Itching and Rash  . Metronidazole Itching, Swelling and Rash  . Naproxen Itching, Swelling and Rash  . Nsaids Itching, Swelling and Rash    Can take advil  . Septra [Bactrim] Itching, Swelling and Rash  . Sulfonamide Derivatives Itching, Swelling and Rash  . Tape Itching and Rash   Social History   Social History  . Marital status: Married    Spouse name: N/A  . Number of children: 1  . Years of education: N/A   Occupational History  . 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  Retired   Social History Main Topics  . Smoking status: Former Smoker    Packs/day: 0.50    Years: 40.00    Types: Cigarettes    Quit date: 02/13/2011  . Smokeless tobacco: Never Used  . Alcohol use No  . Drug use: No  . Sexual activity: Not on file   Other Topics Concern  . Not on file   Social History Narrative  . No narrative on file      Review of Systems  All other systems reviewed and are negative.      Objective:    Physical Exam  Cardiovascular: Normal rate, regular rhythm and normal heart sounds.   Pulmonary/Chest: Effort normal and breath sounds normal. No respiratory distress. She has no wheezes. She has no rales. She exhibits no tenderness.  Abdominal: Soft. She exhibits no distension, no fluid wave, no ascites and no mass. Bowel sounds are decreased. There is tenderness in the right upper quadrant and epigastric area. There is no rebound and no CVA tenderness. No hernia.    Musculoskeletal: She exhibits edema.  Vitals reviewed.         Assessment & Plan:  Chronic nausea - Plan: CBC with Differential/Platelet, COMPLETE METABOLIC PANEL WITH GFR, Sedimentation rate,  Lipase, NM Gastric Emptying  Chronic fatigue - Plan: TSH  Differential diagnosis is large. Begin by obtaining a CBC to evaluate for leukocytosis, a CMP to evaluate for any electrolyte problems, sedimentation rate to look for significant inflammation somewhere in the body, a lipase to evaluate for pancreatitis, and a gastric emptying study to evaluate for gastroparesis. Given her fatigue I will also check a TSH. If labs are normal, and gastric emptying study is normal, and nausea persist, consider a CAT scan to evaluate for abdominal malignancy. If CAT scan is normal, consider psychogenic causes of nausea such as depression or anxiety. It is also possible that Lexapro could be causing this so I asked her to wean down the Lexapro to 10 mg a day.

## 2016-11-09 LAB — COMPLETE METABOLIC PANEL WITH GFR
ALT: 8 U/L (ref 6–29)
AST: 13 U/L (ref 10–35)
Albumin: 3.4 g/dL — ABNORMAL LOW (ref 3.6–5.1)
Alkaline Phosphatase: 68 U/L (ref 33–130)
BUN: 21 mg/dL (ref 7–25)
CO2: 25 mmol/L (ref 20–31)
Calcium: 9 mg/dL (ref 8.6–10.4)
Chloride: 104 mmol/L (ref 98–110)
Creat: 1.27 mg/dL — ABNORMAL HIGH (ref 0.60–0.93)
GFR, Est African American: 49 mL/min — ABNORMAL LOW (ref >=60)
GFR, Est Non African American: 42 mL/min — ABNORMAL LOW (ref >=60)
Glucose, Bld: 103 mg/dL — ABNORMAL HIGH (ref 70–99)
Potassium: 4.7 mmol/L (ref 3.5–5.3)
Sodium: 139 mmol/L (ref 135–146)
Total Bilirubin: 0.4 mg/dL (ref 0.2–1.2)
Total Protein: 5.8 g/dL — ABNORMAL LOW (ref 6.1–8.1)

## 2016-11-09 LAB — CBC WITH DIFFERENTIAL/PLATELET
Basophils Absolute: 0 cells/uL (ref 0–200)
Basophils Relative: 0 %
Eosinophils Absolute: 0 cells/uL — ABNORMAL LOW (ref 15–500)
Eosinophils Relative: 0 %
HCT: 40.3 % (ref 35.0–45.0)
Hemoglobin: 12.9 g/dL (ref 12.0–15.0)
Lymphocytes Relative: 34 %
Lymphs Abs: 2584 cells/uL (ref 850–3900)
MCH: 31.2 pg (ref 27.0–33.0)
MCHC: 32 g/dL (ref 32.0–36.0)
MCV: 97.6 fL (ref 80.0–100.0)
MPV: 10.1 fL (ref 7.5–12.5)
Monocytes Absolute: 456 cells/uL (ref 200–950)
Monocytes Relative: 6 %
Neutro Abs: 4560 cells/uL (ref 1500–7800)
Neutrophils Relative %: 60 %
Platelets: 257 10*3/uL (ref 140–400)
RBC: 4.13 MIL/uL (ref 3.80–5.10)
RDW: 14.5 % (ref 11.0–15.0)
WBC: 7.6 10*3/uL (ref 3.8–10.8)

## 2016-11-09 LAB — SEDIMENTATION RATE: Sed Rate: 4 mm/hr (ref 0–30)

## 2016-11-09 LAB — LIPASE: Lipase: 43 U/L (ref 7–60)

## 2016-11-15 ENCOUNTER — Encounter (HOSPITAL_COMMUNITY)
Admission: RE | Admit: 2016-11-15 | Discharge: 2016-11-15 | Disposition: A | Discharge status: Home / Self Care | Payer: Medicare Other | Source: Ambulatory Visit | Attending: Family Medicine | Admitting: Family Medicine

## 2016-11-15 ENCOUNTER — Other Ambulatory Visit: Payer: Self-pay | Admitting: Family Medicine

## 2016-11-15 DIAGNOSIS — R110 Nausea: Secondary | ICD-10-CM | POA: Diagnosis not present

## 2016-11-15 DIAGNOSIS — R1012 Left upper quadrant pain: Secondary | ICD-10-CM | POA: Diagnosis not present

## 2016-11-15 MED ORDER — TECHNETIUM TC 99M SULFUR COLLOID
2.1000 | Freq: Once | INTRAVENOUS | Status: AC | PRN
  Administered 2016-11-15: 2.1 via INTRAVENOUS

## 2016-11-15 MED ORDER — METOCLOPRAMIDE HCL 5 MG PO TABS: 5.0000 mg | ORAL_TABLET | Freq: Three times a day (TID) | ORAL | 3 refills | Status: DC

## 2016-11-23 ENCOUNTER — Ambulatory Visit (INDEPENDENT_AMBULATORY_CARE_PROVIDER_SITE_OTHER): Payer: Medicare Other | Admitting: Family Medicine

## 2016-11-23 VITALS — BP 90/52 | HR 76 | Temp 98.6°F | Resp 16 | Ht 61.0 in | Wt 145.0 lb

## 2016-11-23 DIAGNOSIS — R110 Nausea: Secondary | ICD-10-CM

## 2016-11-23 DIAGNOSIS — R634 Abnormal weight loss: Secondary | ICD-10-CM | POA: Diagnosis not present

## 2016-11-23 DIAGNOSIS — R5382 Chronic fatigue, unspecified: Secondary | ICD-10-CM | POA: Diagnosis not present

## 2016-11-23 DIAGNOSIS — I952 Hypotension due to drugs: Secondary | ICD-10-CM

## 2016-11-23 NOTE — Progress Notes (Signed)
Subjective:    Patient ID: Brittany Kirk, female    DOB: 03-04-1946, 71 y.o.   MRN: [id number]  HPI  11/08/16 Patient presents today complaining chronic daily nausea for 2 months.  She states that she becomes extremely nauseated whenever she eats. Symptoms began roughly 2 months ago after a melanotic stool. This occurred one time and then resolve spontaneously however the nausea has persisted. Compared to her weight in October, she has lost 9 pounds. Patient states that she's not able to eat any solid food. She has to force herself to eat. However she is not vomiting. She denies any diarrhea. She denies any constipation. She denies any change in her bowel movements. She denies any fevers or chills. She has a history of cholecystectomy. She saw her gastroenterologist who reportedly told the patient that it was not due to an abnormality in her gastrointestinal tract. Patient states that labs were drawn but no other tests were performed. She states that the nausea is getting worse. She is on Lexapro 20 mg a day for the last year secondary to depression. She began the medication after her husband had an affair. However the relationship is stable now. She does not think that anxiety or depression is causing her nausea. She has not had any recent change in her medications. Colonoscopy in 2014 was normal. Patient had a CAT scan of the abdomen and pelvis that showed gallstones in 2014 but no other abnormalities. I do not see a recent EGD. She is tried over-the-counter acid reflux medication without benefit. Her gastroenterologist tried her on Zofran without benefit.  At that time, my plan was:  Differential diagnosis is large. Begin by obtaining a CBC to evaluate for leukocytosis, a CMP to evaluate for any electrolyte problems, sedimentation rate to look for significant inflammation somewhere in the body, a lipase to evaluate for pancreatitis, and a gastric emptying study to evaluate for gastroparesis. Given her  fatigue I will also check a TSH. If labs are normal, and gastric emptying study is normal, and nausea persist, consider a CAT scan to evaluate for abdominal malignancy. If CAT scan is normal, consider psychogenic causes of nausea such as depression or anxiety. It is also possible that Lexapro could be causing this so I asked her to wean down the Lexapro to 10 mg a day.  11/23/16 Gastric emptying study was normal and revealed no evidence of gastroparesis. Patient was started empirically on Reglan 5 mg 3 times a day. She thinks this may be helping slightly but she continues to lose weight and not eat. Her weight is down 1 pound since her last office visit. She denies abdominal pain. She denies any vomiting. She denies any diarrhea. She denies any constipation. Her biggest complaint is that she becomes sick to her stomach every time she tries to eat. Weaning down on the Lexapro may have helped some but is minimal at the present time Past Medical History:  Diagnosis Date  . Allergic rhinitis   . Asymptomatic bilateral carotid artery stenosis   . CAD (coronary artery disease)   . Cancer ([place])   . Complication of anesthesia    sore throat after a surgery  . Depression   . Emphysema    no longer uses inhalers  . Gallstones    nausea, pain upper right abdomen  . GERD (gastroesophageal reflux disease)   . H/O hiatal hernia   . History of skin cancer   . Hyperlipidemia   . Hypertension   .  Shortness of breath    sob if walks up flight of stairs  . Tobacco user   . Wears dentures    top   Past Surgical History:  Procedure Laterality Date  . BREAST LUMPECTOMY Right 2015  . CHOLECYSTECTOMY  07/08/2012   Procedure: LAPAROSCOPIC CHOLECYSTECTOMY WITH INTRAOPERATIVE CHOLANGIOGRAM;  Surgeon: Gayland Curry, MD,FACS;  Location: [facility];  Service: General;  Laterality: N/A;  Laparoscopic Cholecystectomy with Intraoperative Cholangiogram  . COLONOSCOPY    . HAND SURGERY Left    wrist  . TONSILECTOMY,  ADENOIDECTOMY, BILATERAL MYRINGOTOMY AND TUBES    . TUBAL LIGATION     Current Outpatient Prescriptions on File Prior to Visit  Medication Sig Dispense Refill  . anastrozole (ARIMIDEX) 1 MG tablet Take 1 tablet (1 mg total) by mouth daily. 90 tablet 4  . aspirin 81 MG tablet Take 81 mg by mouth daily.    . Cholecalciferol (VITAMIN D3) 5000 UNITS CAPS Take 5,000 Units by mouth daily.    . cyanocobalamin 2000 MCG tablet Take 2,000 mcg by mouth daily. B12 slow release tablet daily    . diazepam (VALIUM) 5 MG tablet TAKE 1 TABLET BY MOUTH AT BEDTIME AS NEEDED FOR ANXIETY  3  . escitalopram (LEXAPRO) 20 MG tablet Take 10 mg by mouth daily.     Marland Kitchen HYDROcodone-acetaminophen (NORCO) 5-325 MG tablet Take 1 tablet by mouth every 6 (six) hours as needed for moderate pain. 30 tablet 0  . losartan (COZAAR) 50 MG tablet TAKE 1 TABLET EVERY DAY (Patient taking differently: Only taking 25mg ) 90 tablet 3  . metoCLOPramide (REGLAN) 5 MG tablet Take 1 tablet (5 mg total) by mouth 3 (three) times daily before meals. 90 tablet 3  . Multiple Vitamin (MULTIVITAMIN) tablet Take 1 tablet by mouth daily.    . mupirocin ointment (BACTROBAN) 2 % Apply to affected site 2 times daily 22 g 0  . Tiotropium Bromide Monohydrate (SPIRIVA RESPIMAT) 1.25 MCG/ACT AERS Inhale 2 puffs into the lungs daily. 1 Inhaler 0  . vitamin E 200 UNIT capsule Take 200 Units by mouth daily.    . Vitamin Mixture (VITAMIN C) LIQD Take 2 sprays by mouth daily.    Marland Kitchen albuterol (PROVENTIL HFA;VENTOLIN HFA) 108 (90 Base) MCG/ACT inhaler Inhale 2 puffs into the lungs every 6 (six) hours as needed for wheezing or shortness of breath. (Patient not taking: Reported on 10/25/2016) 1 Inhaler 2   No current facility-administered medications on file prior to visit.    Allergies  Allergen Reactions  . Penicillins Anaphylaxis, Itching, Swelling and Rash    Has patient had a PCN reaction causing immediate rash, facial/tongue/throat swelling, SOB or  lightheadedness with hypotension: Yes Has patient had a PCN reaction causing severe rash involving mucus membranes or skin necrosis: No Has patient had a PCN reaction that required hospitalization: Unk Has patient had a PCN reaction occurring within the last 10 years: No If all of the above answers are "NO", then may proceed with Cephalosporin use.   . Cefaclor Itching, Swelling and Rash  . Latex Itching and Rash  . Metronidazole Itching, Swelling and Rash  . Naproxen Itching, Swelling and Rash  . Nsaids Itching, Swelling and Rash    Can take advil  . Septra [Bactrim] Itching, Swelling and Rash  . Sulfonamide Derivatives Itching, Swelling and Rash  . Tape Itching and Rash   Social History   Social History  . Marital status: Married    Spouse name: N/A  . Number of  children: 1  . Years of education: N/A   Occupational History  . 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  Retired   Social History Main Topics  . Smoking status: Former Smoker    Packs/day: 0.50    Years: 40.00    Types: Cigarettes    Quit date: 02/13/2011  . Smokeless tobacco: Never Used  . Alcohol use No  . Drug use: No  . Sexual activity: Not on file   Other Topics Concern  . Not on file   Social History Narrative  . No narrative on file      Review of Systems  All other systems reviewed and are negative.      Objective:   Physical Exam  Cardiovascular: Normal rate, regular rhythm and normal heart sounds.   Pulmonary/Chest: Effort normal and breath sounds normal. No respiratory distress. She has no wheezes. She has no rales. She exhibits no tenderness.  Abdominal: Soft. She exhibits no distension, no fluid wave, no ascites and no mass. Bowel sounds are decreased. There is tenderness in the right upper quadrant and epigastric area. There is no rebound and no CVA tenderness. No hernia.    Musculoskeletal: She exhibits edema.  Vitals reviewed.         Assessment & Plan:  Chronic nausea - Plan: CT  Abdomen Pelvis W Contrast  Chronic fatigue  Hypotension due to drugs  Weight loss - Plan: CT Abdomen Pelvis W Contrast I will continue to wean the patient down Lexapro given the fact this can cause stomach upset. Decrease to 5 mg a day for 2 weeks and then discontinue the medication altogether. Continue the Reglan at the present time since this may have helped slightly. Proceed with a CT scan of the abdomen and pelvis to rule out an occult malignancy as a cause of her nausea and weight loss. Discontinue losartan due to her hypotension and increase her intake of fluids such as Gatorade.

## 2016-11-28 ENCOUNTER — Ambulatory Visit
Admission: RE | Admit: 2016-11-28 | Discharge: 2016-11-28 | Disposition: A | Discharge status: Home / Self Care | Payer: Medicare Other | Source: Ambulatory Visit | Attending: Family Medicine | Admitting: Family Medicine

## 2016-11-28 DIAGNOSIS — R634 Abnormal weight loss: Secondary | ICD-10-CM

## 2016-11-28 DIAGNOSIS — R110 Nausea: Secondary | ICD-10-CM

## 2016-11-28 MED ORDER — IOPAMIDOL (ISOVUE-300) INJECTION 61%
100.0000 mL | Freq: Once | INTRAVENOUS | Status: AC | PRN
  Administered 2016-11-28: 100 mL via INTRAVENOUS

## 2016-12-31 ENCOUNTER — Ambulatory Visit (INDEPENDENT_AMBULATORY_CARE_PROVIDER_SITE_OTHER): Payer: Medicare Other | Admitting: Family Medicine

## 2016-12-31 ENCOUNTER — Encounter: Payer: Self-pay | Admitting: Family Medicine

## 2016-12-31 VITALS — BP 112/66 | HR 60 | Temp 98.1°F | Resp 14 | Ht 61.0 in | Wt 141.0 lb

## 2016-12-31 DIAGNOSIS — J040 Acute laryngitis: Secondary | ICD-10-CM | POA: Diagnosis not present

## 2016-12-31 DIAGNOSIS — H00015 Hordeolum externum left lower eyelid: Secondary | ICD-10-CM

## 2016-12-31 MED ORDER — ERYTHROMYCIN 5 MG/GM OP OINT: 1.0000 "application " | TOPICAL_OINTMENT | Freq: Every day | OPHTHALMIC | 0 refills | Status: DC

## 2016-12-31 NOTE — Progress Notes (Signed)
Subjective:    Patient ID: Brittany Kirk, female    DOB: [birth year], 71 y.o.   MRN: [id number]  HPI  The patient's nausea has finally improved after reducing the dose of her Lexapro. However over the last 2 days, she has developed an erythematous tender painful large stye on her left lower eyelid. She also reports 2 weeks of intermittent hoarseness, tenderness in the right side of her throat, postnasal drip, and occasional right ear pain. Exam today is unremarkable. There is no erythema or exudate in the posterior oropharynx. There is no lymphadenopathy or palpable mass on examination of the neck. Her tympanic membrane appears gray and normal in her right ear. Her voice today is normal. She states that her voice has been cracking occasionally over the last 2 weeks Past Medical History:  Diagnosis Date  . Allergic rhinitis   . Asymptomatic bilateral carotid artery stenosis   . CAD (coronary artery disease)   . Cancer ([place])   . Complication of anesthesia    sore throat after a surgery  . Depression   . Emphysema    no longer uses inhalers  . Gallstones    nausea, pain upper right abdomen  . GERD (gastroesophageal reflux disease)   . H/O hiatal hernia   . History of skin cancer   . Hyperlipidemia   . Hypertension   . Shortness of breath    sob if walks up flight of stairs  . Tobacco user   . Wears dentures    top   Past Surgical History:  Procedure Laterality Date  . BREAST LUMPECTOMY Right 2015  . CHOLECYSTECTOMY  07/08/2012   Procedure: LAPAROSCOPIC CHOLECYSTECTOMY WITH INTRAOPERATIVE CHOLANGIOGRAM;  Surgeon: Gayland Curry, MD,FACS;  Location: [facility];  Service: General;  Laterality: N/A;  Laparoscopic Cholecystectomy with Intraoperative Cholangiogram  . COLONOSCOPY    . HAND SURGERY Left    wrist  . TONSILECTOMY, ADENOIDECTOMY, BILATERAL MYRINGOTOMY AND TUBES    . TUBAL LIGATION     Current Outpatient Prescriptions on File Prior to Visit  Medication Sig Dispense Refill  .  anastrozole (ARIMIDEX) 1 MG tablet Take 1 tablet (1 mg total) by mouth daily. 90 tablet 4  . aspirin 81 MG tablet Take 81 mg by mouth daily.    . Cholecalciferol (VITAMIN D3) 5000 UNITS CAPS Take 5,000 Units by mouth daily.    . cyanocobalamin 2000 MCG tablet Take 2,000 mcg by mouth daily. B12 slow release tablet daily    . diazepam (VALIUM) 5 MG tablet TAKE 1 TABLET BY MOUTH AT BEDTIME AS NEEDED FOR ANXIETY  3  . escitalopram (LEXAPRO) 20 MG tablet Take 10 mg by mouth daily.     Marland Kitchen losartan (COZAAR) 50 MG tablet TAKE 1 TABLET EVERY DAY (Patient taking differently: Only taking 25mg ) 90 tablet 3  . Multiple Vitamin (MULTIVITAMIN) tablet Take 1 tablet by mouth daily.    Marland Kitchen omeprazole (PRILOSEC) 20 MG capsule Take 20 mg by mouth daily.    . vitamin E 200 UNIT capsule Take 200 Units by mouth daily.    . Vitamin Mixture (VITAMIN C) LIQD Take 2 sprays by mouth daily.     No current facility-administered medications on file prior to visit.    Allergies  Allergen Reactions  . Penicillins Anaphylaxis, Itching, Swelling and Rash    Has patient had a PCN reaction causing immediate rash, facial/tongue/throat swelling, SOB or lightheadedness with hypotension: Yes Has patient had a PCN reaction causing severe rash involving mucus membranes  or skin necrosis: No Has patient had a PCN reaction that required hospitalization: Unk Has patient had a PCN reaction occurring within the last 10 years: No If all of the above answers are "NO", then may proceed with Cephalosporin use.   . Cefaclor Itching, Swelling and Rash  . Latex Itching and Rash  . Metronidazole Itching, Swelling and Rash  . Naproxen Itching, Swelling and Rash  . Nsaids Itching, Swelling and Rash    Can take advil  . Septra [Bactrim] Itching, Swelling and Rash  . Sulfonamide Derivatives Itching, Swelling and Rash  . Tape Itching and Rash   Social History   Social History  . Marital status: Married    Spouse name: N/A  . Number of  children: 1  . Years of education: N/A   Occupational History  . 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  Retired   Social History Main Topics  . Smoking status: Former Smoker    Packs/day: 0.50    Years: 40.00    Types: Cigarettes    Quit date: 02/13/2011  . Smokeless tobacco: Never Used  . Alcohol use No  . Drug use: No  . Sexual activity: Not on file   Other Topics Concern  . Not on file   Social History Narrative  . No narrative on file      Review of Systems  All other systems reviewed and are negative.      Objective:   Physical Exam  HENT:  Right Ear: External ear normal.  Left Ear: External ear normal.  Nose: Nose normal.  Mouth/Throat: Oropharynx is clear and moist. No oropharyngeal exudate.  Neck: Neck supple. No tracheal deviation present. No thyromegaly present.  Cardiovascular: Normal rate, regular rhythm and normal heart sounds.   Pulmonary/Chest: Effort normal and breath sounds normal. No stridor. No respiratory distress. She has no wheezes. She has no rales. She exhibits no tenderness.  Abdominal: Soft. Normal appearance and bowel sounds are normal. She exhibits no distension. There is no tenderness. There is no rebound.  Musculoskeletal: She exhibits no edema.  Lymphadenopathy:    She has no cervical adenopathy.  Vitals reviewed.         Assessment & Plan:  Laryngitis  Hordeolum externum of left lower eyelid I will treat the large stye on her left lower eyelid with erythromycin ointment applied at night for the next week and warm compresses 3 times a day. I believe that she may have a mild file laryngitis causing the sore throat and the occasional hoarseness. It is been 10-14 days in duration. Today her exam is unremarkable. I have recommended clinical monitoring for the next 2 weeks. If still persistent at that time, I would recommend an ENT consultation for laryngoscopy. Patient is comfortable with that plan

## 2017-01-04 ENCOUNTER — Encounter (HOSPITAL_COMMUNITY): Payer: Self-pay | Admitting: Emergency Medicine

## 2017-01-04 ENCOUNTER — Observation Stay (HOSPITAL_COMMUNITY)
Admission: EM | Admit: 2017-01-04 | Discharge: 2017-01-05 | Disposition: A | Discharge status: Home / Self Care | Payer: Medicare Other | Attending: Internal Medicine | Admitting: Internal Medicine

## 2017-01-04 ENCOUNTER — Emergency Department (HOSPITAL_COMMUNITY): Payer: Medicare Other

## 2017-01-04 DIAGNOSIS — I6523 Occlusion and stenosis of bilateral carotid arteries: Secondary | ICD-10-CM | POA: Diagnosis not present

## 2017-01-04 DIAGNOSIS — F329 Major depressive disorder, single episode, unspecified: Secondary | ICD-10-CM | POA: Insufficient documentation

## 2017-01-04 DIAGNOSIS — Z91048 Other nonmedicinal substance allergy status: Secondary | ICD-10-CM | POA: Insufficient documentation

## 2017-01-04 DIAGNOSIS — Z7982 Long term (current) use of aspirin: Secondary | ICD-10-CM | POA: Insufficient documentation

## 2017-01-04 DIAGNOSIS — J439 Emphysema, unspecified: Secondary | ICD-10-CM | POA: Insufficient documentation

## 2017-01-04 DIAGNOSIS — R42 Dizziness and giddiness: Secondary | ICD-10-CM | POA: Insufficient documentation

## 2017-01-04 DIAGNOSIS — Z79899 Other long term (current) drug therapy: Secondary | ICD-10-CM | POA: Diagnosis not present

## 2017-01-04 DIAGNOSIS — J449 Chronic obstructive pulmonary disease, unspecified: Secondary | ICD-10-CM | POA: Diagnosis present

## 2017-01-04 DIAGNOSIS — R079 Chest pain, unspecified: Secondary | ICD-10-CM | POA: Diagnosis not present

## 2017-01-04 DIAGNOSIS — N183 Chronic kidney disease, stage 3 (moderate): Secondary | ICD-10-CM | POA: Insufficient documentation

## 2017-01-04 DIAGNOSIS — I129 Hypertensive chronic kidney disease with stage 1 through stage 4 chronic kidney disease, or unspecified chronic kidney disease: Secondary | ICD-10-CM | POA: Diagnosis present

## 2017-01-04 DIAGNOSIS — I2510 Atherosclerotic heart disease of native coronary artery without angina pectoris: Secondary | ICD-10-CM | POA: Diagnosis not present

## 2017-01-04 DIAGNOSIS — R0789 Other chest pain: Secondary | ICD-10-CM | POA: Diagnosis not present

## 2017-01-04 DIAGNOSIS — K219 Gastro-esophageal reflux disease without esophagitis: Secondary | ICD-10-CM | POA: Diagnosis present

## 2017-01-04 DIAGNOSIS — Z170 Estrogen receptor positive status [ER+]: Secondary | ICD-10-CM

## 2017-01-04 DIAGNOSIS — Z880 Allergy status to penicillin: Secondary | ICD-10-CM | POA: Diagnosis not present

## 2017-01-04 DIAGNOSIS — Z91040 Latex allergy status: Secondary | ICD-10-CM | POA: Diagnosis not present

## 2017-01-04 DIAGNOSIS — Z888 Allergy status to other drugs, medicaments and biological substances status: Secondary | ICD-10-CM | POA: Diagnosis not present

## 2017-01-04 DIAGNOSIS — Z87891 Personal history of nicotine dependence: Secondary | ICD-10-CM | POA: Insufficient documentation

## 2017-01-04 DIAGNOSIS — Z881 Allergy status to other antibiotic agents status: Secondary | ICD-10-CM | POA: Diagnosis not present

## 2017-01-04 DIAGNOSIS — R0602 Shortness of breath: Secondary | ICD-10-CM | POA: Diagnosis not present

## 2017-01-04 DIAGNOSIS — C50411 Malignant neoplasm of upper-outer quadrant of right female breast: Secondary | ICD-10-CM | POA: Diagnosis present

## 2017-01-04 DIAGNOSIS — F419 Anxiety disorder, unspecified: Secondary | ICD-10-CM | POA: Insufficient documentation

## 2017-01-04 DIAGNOSIS — Z882 Allergy status to sulfonamides status: Secondary | ICD-10-CM | POA: Insufficient documentation

## 2017-01-04 DIAGNOSIS — Z85828 Personal history of other malignant neoplasm of skin: Secondary | ICD-10-CM | POA: Diagnosis not present

## 2017-01-04 DIAGNOSIS — I10 Essential (primary) hypertension: Secondary | ICD-10-CM | POA: Diagnosis present

## 2017-01-04 LAB — CBC
HCT: 39.5 % (ref 36.0–46.0)
Hemoglobin: 12.9 g/dL (ref 12.0–15.0)
MCH: 30.9 pg (ref 26.0–34.0)
MCHC: 32.7 g/dL (ref 30.0–36.0)
MCV: 94.7 fL (ref 78.0–100.0)
Platelets: 225 10*3/uL (ref 150–400)
RBC: 4.17 MIL/uL (ref 3.87–5.11)
RDW: 14.2 % (ref 11.5–15.5)
WBC: 9.2 10*3/uL (ref 4.0–10.5)

## 2017-01-04 LAB — BASIC METABOLIC PANEL
Anion gap: 9 (ref 5–15)
BUN: 18 mg/dL (ref 6–20)
CO2: 24 mmol/L (ref 22–32)
Calcium: 9.1 mg/dL (ref 8.9–10.3)
Chloride: 105 mmol/L (ref 101–111)
Creatinine, Ser: 1.14 mg/dL — ABNORMAL HIGH (ref 0.44–1.00)
GFR calc Af Amer: 54 mL/min — ABNORMAL LOW (ref >60)
GFR calc non Af Amer: 47 mL/min — ABNORMAL LOW (ref >60)
Glucose, Bld: 90 mg/dL (ref 65–99)
Potassium: 3.9 mmol/L (ref 3.5–5.1)
Sodium: 138 mmol/L (ref 135–145)

## 2017-01-04 LAB — I-STAT TROPONIN, ED: Troponin i, poc: 0 ng/mL (ref 0.00–0.08)

## 2017-01-04 MED ORDER — NITROGLYCERIN 0.4 MG SL SUBL: 0.4000 mg | SUBLINGUAL_TABLET | SUBLINGUAL | Status: DC | PRN

## 2017-01-04 MED ORDER — ALBUTEROL SULFATE (2.5 MG/3ML) 0.083% IN NEBU
5.0000 mg | INHALATION_SOLUTION | Freq: Once | RESPIRATORY_TRACT | Status: AC
  Administered 2017-01-05: 5 mg via RESPIRATORY_TRACT
  Filled 2017-01-04: qty 6

## 2017-01-04 MED ORDER — ASPIRIN 81 MG PO CHEW
243.0000 mg | CHEWABLE_TABLET | Freq: Once | ORAL | Status: AC
  Administered 2017-01-04: 243 mg via ORAL
  Filled 2017-01-04: qty 3

## 2017-01-04 NOTE — ED Provider Notes (Signed)
[facility] DEPT Provider Note   CSN: [id number] Arrival date & time: 01/04/17  2458     History   Chief Complaint Chief Complaint  Patient presents with  . Chest Pain    HPI Brittany Kirk is a 71 y.o. female with a hx of COPD, HTN, coronary artery disease, allergic rhinitis, hiatal hernia with GERD, breast CA s/p R lumpectomy + anastrozole, CKD stage 3, presents to the Emergency Department complaining of gradual, persistent, progressively worsening left chest pain radiating to the back onset noon today and rated at an 8/10. Associated symptoms include nausea, lightheadedness, SOB, left arm heaviness and pressure in her left shoulder.  Pt reports similar events, but never persistent like today.  Pt reports strong family hx of MI.  Pt reports cardiac cath in the 1990s.   Exertion worsened her SOB.  No alleviating factors.  Pt denies fever, chills, headache, neck pain, cough, abd pain, vomiting, diarrhea.   Pt is a previous smoker of 50 years but quit in 2012.  Eyes history of DVT, recent travel, leg swelling, pain with inspiration.    Record review shows pt had a nuclear stress test in Aug 2018:  Nuclear stress EF: 70%. No wall motion abnormalities  The left ventricular ejection fraction is hyperdynamic (>65%).  There was no ST segment deviation noted during stress.  This is a low risk study. No perfusion defects, no ischemia identified    The history is provided by the patient and medical records. No language interpreter was used.    Past Medical History:  Diagnosis Date  . Allergic rhinitis   . Asymptomatic bilateral carotid artery stenosis   . CAD (coronary artery disease)   . Cancer ([place])   . Complication of anesthesia    sore throat after a surgery  . Depression   . Emphysema    no longer uses inhalers  . Gallstones    nausea, pain upper right abdomen  . GERD (gastroesophageal reflux disease)   . H/O hiatal hernia   . History of skin cancer   . Hyperlipidemia    . Hypertension   . Shortness of breath    sob if walks up flight of stairs  . Tobacco user   . Wears dentures    top    Patient Active Problem List   Diagnosis Date Noted  . GERD (gastroesophageal reflux disease) 08/10/2016  . Cough 08/10/2016  . Acute bronchitis 07/12/2016  . Bilateral carotid artery disease ([place]) 02/14/2016  . Chest pain 01/13/2016  . Osteoporosis 02/22/2015  . CKD (chronic kidney disease), stage III 08/16/2014  . Breast cancer of upper-outer quadrant of right female breast ([place]) 03/22/2014  . Dizziness and giddiness 11/27/2012  . Essential tremor 11/27/2012  . Numbness 11/27/2012  . TOBACCO USER 10/04/2009  . CLOSED FRACTURE OF ONE RIB 07/12/2009  . HYPERLIPIDEMIA TYPE IIB / III 09/28/2008  . CAD, NATIVE VESSEL 09/28/2008  . Rhinitis 09/09/2008  . EMPHYSEMA 09/07/2008  . Essential hypertension 09/06/2008  . SKIN CANCER, HX OF 09/06/2008    Past Surgical History:  Procedure Laterality Date  . BREAST LUMPECTOMY Right 2015  . CHOLECYSTECTOMY  07/08/2012   Procedure: LAPAROSCOPIC CHOLECYSTECTOMY WITH INTRAOPERATIVE CHOLANGIOGRAM;  Surgeon: Gayland Curry, MD,FACS;  Location: [facility] ORS;  Service: General;  Laterality: N/A;  Laparoscopic Cholecystectomy with Intraoperative Cholangiogram  . COLONOSCOPY    . HAND SURGERY Left    wrist  . TONSILECTOMY, ADENOIDECTOMY, BILATERAL MYRINGOTOMY AND TUBES    . TUBAL LIGATION  OB History    No data available       Home Medications    Prior to Admission medications   Medication Sig Start Date End Date Taking? Authorizing Provider  anastrozole (ARIMIDEX) 1 MG tablet Take 1 tablet (1 mg total) by mouth daily. 02/23/16  Yes Magrinat, Virgie Dad, MD  aspirin 81 MG tablet Take 81 mg by mouth daily.   Yes [provider]  atorvastatin (LIPITOR) 40 MG tablet Take 40 mg by mouth at bedtime.   Yes [provider]  Cyanocobalamin (VITAMIN B-12 PO) Take 1 tablet by mouth daily.   Yes [provider]  diazepam (VALIUM) 5 MG tablet Take 5 mg by mouth at bedtime 07/19/15  Yes [provider]  erythromycin [facility]) ophthalmic ointment Place 1 application into the left eye at bedtime. 12/31/16  Yes Susy Frizzle, MD  escitalopram (LEXAPRO) 20 MG tablet Take 20 mg by mouth daily.    Yes [provider]  metoCLOPramide (REGLAN) 5 MG tablet Take 5 mg by mouth 3 (three) times daily before meals.    Yes [provider]  Multiple Vitamin (MULTIVITAMIN) tablet Take 1 tablet by mouth daily.   Yes [provider]  omeprazole (PRILOSEC) 20 MG capsule Take 20 mg by mouth daily.   Yes [provider]  losartan (COZAAR) 50 MG tablet TAKE 1 TABLET EVERY DAY Patient not taking: Reported on 01/04/2017 06/18/16   Susy Frizzle, MD    Family History Family History  Problem Relation Age of Onset  . Arthritis Mother   . Heart disease Unknown        5 brothers and 2 sister  . Diverticulitis Sister   . Aplastic anemia Other     Social History Social History  Substance Use Topics  . Smoking status: Former Smoker    Packs/day: 0.50    Years: 40.00    Types: Cigarettes    Quit date: 02/13/2011  . Smokeless tobacco: Never Used  . Alcohol use No     Allergies   Penicillins; Cefaclor; Latex; Metronidazole; Naproxen; Nsaids; Septra [bactrim]; Sulfonamide derivatives; and Tape   Review of Systems Review of Systems  Constitutional: Negative for appetite change, diaphoresis, fatigue, fever and unexpected weight change.  HENT: Negative for mouth sores.   Eyes: Negative for visual disturbance.  Respiratory: Positive for shortness of breath. Negative for cough, chest tightness and wheezing.   Cardiovascular: Positive for chest pain. Negative for leg swelling.  Gastrointestinal: Positive for nausea. Negative for abdominal pain, constipation, diarrhea and vomiting.  Endocrine: Negative for polydipsia, polyphagia and polyuria.  Genitourinary:  Negative for dysuria, frequency, hematuria and urgency.  Musculoskeletal: Positive for arthralgias (left arm). Negative for back pain and neck stiffness.  Skin: Negative for rash.  Allergic/Immunologic: Negative for immunocompromised state.  Neurological: Negative for syncope, light-headedness and headaches.  Hematological: Does not bruise/bleed easily.  Psychiatric/Behavioral: Negative for sleep disturbance. The patient is not nervous/anxious.   All other systems reviewed and are negative.    Physical Exam Updated Vital Signs BP (!) 167/99   Pulse (!) 59   Temp 98 F (36.7 C) (Oral)   Resp 20   Ht 5\' 1"  (1.549 m)   Wt 64 kg (141 lb)   SpO2 97%   BMI 26.64 kg/m   Physical Exam  Constitutional: She appears well-developed and well-nourished. No distress.  Awake, alert, nontoxic appearance  HENT:  Head: Normocephalic and atraumatic.  Mouth/Throat: Oropharynx is clear and moist. No oropharyngeal  exudate.  Eyes: Conjunctivae are normal. No scleral icterus.  Neck: Normal range of motion. Neck supple.  Cardiovascular: Normal rate, regular rhythm and intact distal pulses.   Pulmonary/Chest: Effort normal. No respiratory distress. She has wheezes (fine expiratory throughout). She exhibits tenderness.  Equal chest expansion Tenderness to palpation along the left chest and under the left breast without obvious rash  Abdominal: Soft. Bowel sounds are normal. She exhibits no mass. There is no tenderness. There is no rebound and no guarding.  Musculoskeletal: Normal range of motion. She exhibits no edema.  Neurological: She is alert.  Speech is clear and goal oriented Moves extremities without ataxia  Skin: Skin is warm and dry. She is not diaphoretic.  Psychiatric: She has a normal mood and affect.  Nursing note and vitals reviewed.    ED Treatments / Results  Labs (all labs ordered are listed, but only abnormal results are displayed) Labs Reviewed  BASIC METABOLIC PANEL -  Abnormal; Notable for the following:       Result Value   Creatinine, Ser 1.14 (*)    GFR calc non Af Amer 47 (*)    GFR calc Af Amer 54 (*)    All other components within normal limits  CBC  TROPONIN I  TROPONIN I  TROPONIN I  BASIC METABOLIC PANEL  CBC  I-STAT TROPONIN, ED    EKG  EKG Interpretation  Date/Time:  Friday January 04 2017 18:43:50 EDT Ventricular Rate:  69 PR Interval:  150 QRS Duration: 82 QT Interval:  418 QTC Calculation: 447 R Axis:   34 Text Interpretation:  Normal sinus rhythm Cannot rule out Anterior infarct , age undetermined Abnormal ECG No significant change since last tracing Confirmed by Duffy Bruce [phone]) on 01/04/2017 10:50:08 PM       Radiology Dg Chest 2 View  Result Date: 01/04/2017 CLINICAL DATA:  Chest pain off and on for a while. Worse today. Pain under the left breast radiating to the back and left shoulder and left arm. Numbness in the lips and left arm. Dizziness and nausea. EXAM: CHEST  2 VIEW COMPARISON:  06/28/2016 FINDINGS: Normal heart size and pulmonary vascularity. No focal airspace disease or consolidation in the lungs. No blunting of costophrenic angles. No pneumothorax. Mediastinal contours appear intact. Surgical clips in the right axilla. Calcified and tortuous aorta. Degenerative changes in the spine. IMPRESSION: No evidence of active pulmonary disease.  Aortic atherosclerosis. Electronically Signed   By: Lucienne Capers M.D.   On: 01/04/2017 19:59    Procedures Procedures (including critical care time)  Medications Ordered in ED Medications  atorvastatin (LIPITOR) tablet 40 mg (40 mg Oral Given 01/05/17 0306)  cyanocobalamin tablet 250 mcg (not administered)  metoCLOPramide (REGLAN) tablet 5 mg (not administered)  erythromycin ophthalmic ointment 1 application (1 application Left Eye Given 01/05/17 0306)  pantoprazole (PROTONIX) EC tablet 40 mg (not administered)  anastrozole (ARIMIDEX) tablet 1 mg (not administered)    diazepam (VALIUM) tablet 5 mg (5 mg Oral Given 01/05/17 0113)  aspirin chewable tablet 81 mg (not administered)  multivitamin with minerals tablet 1 tablet (not administered)  escitalopram (LEXAPRO) tablet 20 mg (not administered)  nitroGLYCERIN (NITROSTAT) SL tablet 0.4 mg (0.4 mg Sublingual Given 01/05/17 0028)  acetaminophen (TYLENOL) tablet 650 mg (650 mg Oral Given 01/05/17 0113)  ondansetron (ZOFRAN) injection 4 mg (not administered)  enoxaparin (LOVENOX) injection 40 mg (40 mg Subcutaneous Given 01/05/17 0306)  labetalol (NORMODYNE,TRANDATE) injection 10 mg (not administered)  morphine 4 MG/ML injection 1-3  mg (not administered)  aspirin chewable tablet 243 mg (243 mg Oral Given 01/04/17 2308)  albuterol (PROVENTIL) (2.5 MG/3ML) 0.083% nebulizer solution 5 mg (5 mg Nebulization Given 01/05/17 0040)     Initial Impression / Assessment and Plan / ED Course  I have reviewed the triage vital signs and the nursing notes.  Pertinent labs & imaging results that were available during my care of the patient were reviewed by me and considered in my medical decision making (see chart for details).     Patient presents to the emergency department with persistent left-sided chest pain with nausea, lightheadedness and shortness of breath since noon this morning. Patient reports cardiac cath in the 1990s which showed small vessel disease but main artery was patent. Patient did not have stent placed at that time. She reports today's chest pain is different because it is persistent.   Patient with history of breast cancer however has been cancer free since 2005. Highly doubt DVT/PE at this time. No tachycardia or pain with inspiration.  Patient with some mild tenderness to palpation along the left chest however no rash at this time to suggest shingles.  Patient with fine expiratory wheezes however patient reports she is breathing at baseline.  Patient with moderate risk heart score of 5.  Concern for  persistent pain. Patient will need admission for chest pain rule out.  The patient was discussed with and seen by Dr. Ellender Hose who agrees with the treatment plan.   Final Clinical Impressions(s) / ED Diagnoses   Final diagnoses:  Other chest pain  Shortness of breath    New Prescriptions Current Discharge Medication List       Agapito Games 01/05/17 0424    Duffy Bruce, MD 01/05/17 1309

## 2017-01-04 NOTE — ED Triage Notes (Signed)
Pt reports left sided chest pain that radiates to the left arm and shoulder. Pt also reports shortness of breath, dizziness, nausea and diaphoresis. Hx cardiac cath with no stents.

## 2017-01-04 NOTE — ED Notes (Signed)
Pt states CP that brought her into ED has since resolved. Only pain complaint, at this time, is soreness to L lower rib cage

## 2017-01-05 ENCOUNTER — Encounter (HOSPITAL_COMMUNITY): Payer: Self-pay | Admitting: Family Medicine

## 2017-01-05 ENCOUNTER — Observation Stay (HOSPITAL_BASED_OUTPATIENT_CLINIC_OR_DEPARTMENT_OTHER): Payer: Medicare Other

## 2017-01-05 DIAGNOSIS — N183 Chronic kidney disease, stage 3 (moderate): Secondary | ICD-10-CM | POA: Diagnosis not present

## 2017-01-05 DIAGNOSIS — I10 Essential (primary) hypertension: Secondary | ICD-10-CM | POA: Diagnosis not present

## 2017-01-05 DIAGNOSIS — I2510 Atherosclerotic heart disease of native coronary artery without angina pectoris: Secondary | ICD-10-CM | POA: Diagnosis not present

## 2017-01-05 DIAGNOSIS — C50411 Malignant neoplasm of upper-outer quadrant of right female breast: Secondary | ICD-10-CM | POA: Diagnosis not present

## 2017-01-05 DIAGNOSIS — R079 Chest pain, unspecified: Secondary | ICD-10-CM

## 2017-01-05 LAB — CBC
HCT: 35.3 % — ABNORMAL LOW (ref 36.0–46.0)
Hemoglobin: 11.6 g/dL — ABNORMAL LOW (ref 12.0–15.0)
MCH: 31 pg (ref 26.0–34.0)
MCHC: 32.9 g/dL (ref 30.0–36.0)
MCV: 94.4 fL (ref 78.0–100.0)
Platelets: 195 10*3/uL (ref 150–400)
RBC: 3.74 MIL/uL — ABNORMAL LOW (ref 3.87–5.11)
RDW: 14.2 % (ref 11.5–15.5)
WBC: 7.6 10*3/uL (ref 4.0–10.5)

## 2017-01-05 LAB — BASIC METABOLIC PANEL
Anion gap: 5 (ref 5–15)
BUN: 17 mg/dL (ref 6–20)
CO2: 27 mmol/L (ref 22–32)
Calcium: 8.6 mg/dL — ABNORMAL LOW (ref 8.9–10.3)
Chloride: 107 mmol/L (ref 101–111)
Creatinine, Ser: 0.99 mg/dL (ref 0.44–1.00)
GFR calc Af Amer: >60 mL/min (ref >60)
GFR calc non Af Amer: 55 mL/min — ABNORMAL LOW (ref >60)
Glucose, Bld: 99 mg/dL (ref 65–99)
Potassium: 3.6 mmol/L (ref 3.5–5.1)
Sodium: 139 mmol/L (ref 135–145)

## 2017-01-05 LAB — NM MYOCAR MULTI W/SPECT W/WALL MOTION / EF
Estimated workload: 1 METS
Exercise duration (min): 0 min
Exercise duration (sec): 0 s
MPHR: 147 {beats}/min
Peak HR: 88 {beats}/min
Percent HR: 59 %
Rest HR: 68 {beats}/min

## 2017-01-05 LAB — TROPONIN I
Troponin I: <0.03 ng/mL (ref <0.03)
Troponin I: <0.03 ng/mL (ref <0.03)
Troponin I: <0.03 ng/mL (ref <0.03)

## 2017-01-05 MED ORDER — NITROGLYCERIN 0.4 MG SL SUBL
0.4000 mg | SUBLINGUAL_TABLET | SUBLINGUAL | Status: DC | PRN
  Administered 2017-01-05 (×2): 0.4 mg via SUBLINGUAL
  Filled 2017-01-05: qty 1

## 2017-01-05 MED ORDER — TECHNETIUM TC 99M TETROFOSMIN IV KIT
30.0000 | PACK | Freq: Once | INTRAVENOUS | Status: AC | PRN
  Administered 2017-01-05: 30 via INTRAVENOUS

## 2017-01-05 MED ORDER — CYANOCOBALAMIN 500 MCG PO TABS
250.0000 ug | ORAL_TABLET | Freq: Every day | ORAL | Status: DC
  Filled 2017-01-05: qty 1

## 2017-01-05 MED ORDER — ESOMEPRAZOLE MAGNESIUM 20 MG PO CPDR: 20.0000 mg | DELAYED_RELEASE_CAPSULE | Freq: Two times a day (BID) | ORAL | 0 refills | Status: DC

## 2017-01-05 MED ORDER — METOCLOPRAMIDE HCL 5 MG PO TABS
5.0000 mg | ORAL_TABLET | Freq: Three times a day (TID) | ORAL | Status: DC
  Filled 2017-01-05: qty 1

## 2017-01-05 MED ORDER — MORPHINE SULFATE (PF) 4 MG/ML IV SOLN: 1.0000 mg | INTRAVENOUS | Status: DC | PRN

## 2017-01-05 MED ORDER — ONDANSETRON HCL 4 MG/2ML IJ SOLN: 4.0000 mg | Freq: Four times a day (QID) | INTRAMUSCULAR | Status: DC | PRN

## 2017-01-05 MED ORDER — LABETALOL HCL 5 MG/ML IV SOLN: 10.0000 mg | INTRAVENOUS | Status: DC | PRN

## 2017-01-05 MED ORDER — ASPIRIN 81 MG PO CHEW
81.0000 mg | CHEWABLE_TABLET | Freq: Every day | ORAL | Status: DC
  Administered 2017-01-05: 81 mg via ORAL
  Filled 2017-01-05: qty 1

## 2017-01-05 MED ORDER — ATORVASTATIN CALCIUM 40 MG PO TABS
40.0000 mg | ORAL_TABLET | Freq: Every day | ORAL | Status: DC
  Administered 2017-01-05: 40 mg via ORAL
  Filled 2017-01-05: qty 1

## 2017-01-05 MED ORDER — ACETAMINOPHEN 325 MG PO TABS
650.0000 mg | ORAL_TABLET | ORAL | Status: DC | PRN
  Administered 2017-01-05 (×2): 650 mg via ORAL
  Filled 2017-01-05: qty 2

## 2017-01-05 MED ORDER — DIAZEPAM 5 MG PO TABS
5.0000 mg | ORAL_TABLET | Freq: Every day | ORAL | Status: DC
  Administered 2017-01-05: 5 mg via ORAL
  Filled 2017-01-05: qty 1

## 2017-01-05 MED ORDER — ANASTROZOLE 1 MG PO TABS
1.0000 mg | ORAL_TABLET | Freq: Every day | ORAL | Status: DC
  Filled 2017-01-05 (×2): qty 1

## 2017-01-05 MED ORDER — ADULT MULTIVITAMIN W/MINERALS CH
1.0000 | ORAL_TABLET | Freq: Every day | ORAL | Status: DC
  Administered 2017-01-05: 1 via ORAL
  Filled 2017-01-05: qty 1

## 2017-01-05 MED ORDER — REGADENOSON 0.4 MG/5ML IV SOLN
0.4000 mg | Freq: Once | INTRAVENOUS | Status: AC
  Administered 2017-01-05: 0.4 mg via INTRAVENOUS
  Filled 2017-01-05: qty 5

## 2017-01-05 MED ORDER — ENOXAPARIN SODIUM 40 MG/0.4ML ~~LOC~~ SOLN
40.0000 mg | Freq: Every day | SUBCUTANEOUS | Status: DC
  Administered 2017-01-05: 40 mg via SUBCUTANEOUS
  Filled 2017-01-05: qty 0.4

## 2017-01-05 MED ORDER — NITROGLYCERIN 0.4 MG SL SUBL
SUBLINGUAL_TABLET | SUBLINGUAL | Status: AC
  Filled 2017-01-05: qty 1

## 2017-01-05 MED ORDER — ACETAMINOPHEN 325 MG PO TABS
ORAL_TABLET | ORAL | Status: AC
  Filled 2017-01-05: qty 2

## 2017-01-05 MED ORDER — ESCITALOPRAM OXALATE 10 MG PO TABS
20.0000 mg | ORAL_TABLET | Freq: Every day | ORAL | Status: DC
  Administered 2017-01-05: 20 mg via ORAL
  Filled 2017-01-05: qty 2

## 2017-01-05 MED ORDER — ERYTHROMYCIN 5 MG/GM OP OINT
1.0000 "application " | TOPICAL_OINTMENT | Freq: Every day | OPHTHALMIC | Status: DC
  Administered 2017-01-05: 1 via OPHTHALMIC
  Filled 2017-01-05: qty 3.5

## 2017-01-05 MED ORDER — PANTOPRAZOLE SODIUM 40 MG PO TBEC
40.0000 mg | DELAYED_RELEASE_TABLET | Freq: Every day | ORAL | Status: DC
  Administered 2017-01-05: 40 mg via ORAL
  Filled 2017-01-05: qty 1

## 2017-01-05 MED ORDER — REGADENOSON 0.4 MG/5ML IV SOLN
INTRAVENOUS | Status: DC
Start: 2017-01-05 — End: 2017-01-05
  Filled 2017-01-05: qty 5

## 2017-01-05 MED ORDER — TECHNETIUM TC 99M TETROFOSMIN IV KIT
10.0000 | PACK | Freq: Once | INTRAVENOUS | Status: AC | PRN
  Administered 2017-01-05: 10 via INTRAVENOUS

## 2017-01-05 NOTE — ED Notes (Signed)
Attempted to gain IV access, twice, without success. Ordering IV Team and transporting to assigned room.

## 2017-01-05 NOTE — H&P (Signed)
History and Physical    Brittany Kirk VOZ:[id number] DOB: 1945-12-16 DOA: 01/04/2017  PCP: Susy Frizzle, MD   Patient coming from: Home  Chief Complaint: Fatigue, chest pain, SOB   HPI: Brittany Kirk is a 71 y.o. female with medical history significant for hypertension, coronary artery disease, GERD, chronic kidney disease stage III, and cancer of the right breast status post lumpectomy, now presenting to the emergency department with chest pain. Patient reports that she had been experiencing easy fatigue over the last month or so, but had otherwise been fairly well with no fevers, chills, cough, headaches, or nausea. Then around noon, while cleaning her house, patient developed a pain in the left chest described as moderate to severe in intensity, radiating to the left shoulder, dull and achy in character, constant, with no alleviating or exacerbating factors identified, and associated with nausea, shortness of breath, and sweats. She denies experiencing similar symptoms previously. She reports a strong family history of premature MI. She reports undergoing cardiac catheterization 30 years ago or so, reporting that there were blockages in 2 vessels that were managed medically.  ED Course: Upon arrival to the ED, patient is found to be afebrile, saturating well on room air, and with vital signs stable. EKG features a normal sinus rhythm and chest x-ray is negative for acute cardiopulmonary disease. Chemistry panels notable for serum creatinine 1.14, lower than priors. CBC is unremarkable. Troponin is undetectable. Patient was treated with aspirin in the ED and given an albuterol neb treatment. Pain eventually eased off and resolved while still in the emergency department. She remained hemodynamically stable and in no apparent respiratory distress. She'll be observed on telemetry unit for ongoing evaluation and management of chest pain with reported history of CAD and concern for possible  ACS.  Review of Systems:  All other systems reviewed and apart from HPI, are negative.  Past Medical History:  Diagnosis Date  . Allergic rhinitis   . Asymptomatic bilateral carotid artery stenosis   . CAD (coronary artery disease)   . Cancer ([place])   . Complication of anesthesia    sore throat after a surgery  . Depression   . Emphysema    no longer uses inhalers  . Gallstones    nausea, pain upper right abdomen  . GERD (gastroesophageal reflux disease)   . H/O hiatal hernia   . History of skin cancer   . Hyperlipidemia   . Hypertension   . Shortness of breath    sob if walks up flight of stairs  . Tobacco user   . Wears dentures    top    Past Surgical History:  Procedure Laterality Date  . BREAST LUMPECTOMY Right 2015  . CHOLECYSTECTOMY  07/08/2012   Procedure: LAPAROSCOPIC CHOLECYSTECTOMY WITH INTRAOPERATIVE CHOLANGIOGRAM;  Surgeon: Gayland Curry, MD,FACS;  Location: [facility] ORS;  Service: General;  Laterality: N/A;  Laparoscopic Cholecystectomy with Intraoperative Cholangiogram  . COLONOSCOPY    . HAND SURGERY Left    wrist  . TONSILECTOMY, ADENOIDECTOMY, BILATERAL MYRINGOTOMY AND TUBES    . TUBAL LIGATION       reports that she quit smoking about 5 years ago. Her smoking use included Cigarettes. She has a 20.00 pack-year smoking history. She has never used smokeless tobacco. She reports that she does not drink alcohol or use drugs.  Allergies  Allergen Reactions  . Penicillins Anaphylaxis, Itching, Swelling and Rash    Has patient had a PCN reaction causing immediate rash, facial/tongue/throat swelling, SOB  or lightheadedness with hypotension: Yes Has patient had a PCN reaction causing severe rash involving mucus membranes or skin necrosis: No Has patient had a PCN reaction that required hospitalization: Unk Has patient had a PCN reaction occurring within the last 10 years: No If all of the above answers are "NO", then may proceed with Cephalosporin use.   .  Cefaclor Itching, Swelling and Rash  . Latex Itching and Rash  . Metronidazole Itching, Swelling and Rash  . Naproxen Itching, Swelling and Rash  . Nsaids Itching, Swelling and Rash    Ibuprofen IS tolerated  . Septra [Bactrim] Itching, Swelling and Rash  . Sulfonamide Derivatives Itching, Swelling and Rash  . Tape Itching and Rash    Family History  Problem Relation Age of Onset  . Arthritis Mother   . Heart disease Unknown        5 brothers and 2 sister  . Diverticulitis Sister   . Aplastic anemia Other      Prior to Admission medications   Medication Sig Start Date End Date Taking? Authorizing Provider  anastrozole (ARIMIDEX) 1 MG tablet Take 1 tablet (1 mg total) by mouth daily. 02/23/16  Yes Magrinat, Virgie Dad, MD  aspirin 81 MG tablet Take 81 mg by mouth daily.   Yes [provider]  atorvastatin (LIPITOR) 40 MG tablet Take 40 mg by mouth at bedtime.   Yes [provider]  Cyanocobalamin (VITAMIN B-12 PO) Take 1 tablet by mouth daily.   Yes [provider]  diazepam (VALIUM) 5 MG tablet Take 5 mg by mouth at bedtime 07/19/15  Yes [provider]  erythromycin [facility]) ophthalmic ointment Place 1 application into the left eye at bedtime. 12/31/16  Yes Susy Frizzle, MD  escitalopram (LEXAPRO) 20 MG tablet Take 20 mg by mouth daily.    Yes [provider]  metoCLOPramide (REGLAN) 5 MG tablet Take 5 mg by mouth 3 (three) times daily before meals.    Yes [provider]  Multiple Vitamin (MULTIVITAMIN) tablet Take 1 tablet by mouth daily.   Yes [provider]  omeprazole (PRILOSEC) 20 MG capsule Take 20 mg by mouth daily.   Yes [provider]  losartan (COZAAR) 50 MG tablet TAKE 1 TABLET EVERY DAY Patient not taking: Reported on 01/04/2017 06/18/16   Susy Frizzle, MD    Physical Exam: Vitals:   01/04/17 2203 01/04/17 2230 01/04/17 2245 01/04/17 2330  BP: (!) 160/63 (!) 167/99 (!) 164/69 (!) 179/90   Pulse: 68 (!) 59 61 62  Resp: 16 20 20 16   Temp:      TempSrc:      SpO2: 99% 97% 97% 96%  Weight:      Height:          Constitutional: NAD, calm, comfortable Eyes: PERTLA, lids and conjunctivae normal ENMT: Mucous membranes are moist. Posterior pharynx clear of any exudate or lesions.   Neck: normal, supple, no masses, no thyromegaly Respiratory: clear to auscultation bilaterally, no wheezing, no crackles. Normal respiratory effort.   Cardiovascular: S1 & S2 heard, regular rate and rhythm. No extremity edema. No significant JVD. Abdomen: No distension, no tenderness, no masses palpated. Bowel sounds normal.  Musculoskeletal: no clubbing / cyanosis. No joint deformity upper and lower extremities.   Skin: no significant rashes, lesions, ulcers. Warm, dry, well-perfused. Neurologic: CN 2-12 grossly intact. Sensation intact, DTR normal. Strength 5/5 in all 4 limbs.  Psychiatric: Alert and oriented x 3. Calm and cooperative.  Labs on Admission: I have personally reviewed following labs and imaging studies  CBC:  Recent Labs Lab 01/04/17 1848  WBC 9.2  HGB 12.9  HCT 39.5  MCV 94.7  PLT 629   Basic Metabolic Panel:  Recent Labs Lab 01/04/17 1848  NA 138  K 3.9  CL 105  CO2 24  GLUCOSE 90  BUN 18  CREATININE 1.14*  CALCIUM 9.1   GFR: Estimated Creatinine Clearance: 37.7 mL/min (A) (by C-G formula based on SCr of 1.14 mg/dL (H)). Liver Function Tests: No results for input(s): AST, ALT, ALKPHOS, BILITOT, PROT, ALBUMIN in the last 168 hours. No results for input(s): LIPASE, AMYLASE in the last 168 hours. No results for input(s): AMMONIA in the last 168 hours. Coagulation Profile: No results for input(s): INR, PROTIME in the last 168 hours. Cardiac Enzymes: No results for input(s): CKTOTAL, CKMB, CKMBINDEX, TROPONINI in the last 168 hours. BNP (last 3 results) No results for input(s): PROBNP in the last 8760 hours. HbA1C: No results for input(s): HGBA1C in  the last 72 hours. CBG: No results for input(s): GLUCAP in the last 168 hours. Lipid Profile: No results for input(s): CHOL, HDL, LDLCALC, TRIG, CHOLHDL, LDLDIRECT in the last 72 hours. Thyroid Function Tests: No results for input(s): TSH, T4TOTAL, FREET4, T3FREE, THYROIDAB in the last 72 hours. Anemia Panel: No results for input(s): VITAMINB12, FOLATE, FERRITIN, TIBC, IRON, RETICCTPCT in the last 72 hours. Urine analysis:    Component Value Date/Time   COLORURINE AMBER (A) 07/06/2016 1607   APPEARANCEUR HAZY (A) 07/06/2016 1607   LABSPEC 1.026 07/06/2016 1607   PHURINE 5.0 07/06/2016 1607   GLUCOSEU NEGATIVE 07/06/2016 1607   HGBUR NEGATIVE 07/06/2016 1607   BILIRUBINUR NEGATIVE 07/06/2016 1607   KETONESUR NEGATIVE 07/06/2016 1607   PROTEINUR NEGATIVE 07/06/2016 1607   UROBILINOGEN 0.2 10/06/2014 1614   NITRITE NEGATIVE 07/06/2016 1607   LEUKOCYTESUR NEGATIVE 07/06/2016 1607   Sepsis Labs: @LABRCNTIP (procalcitonin:4,lacticidven:4) )No results found for this or any previous visit (from the past 240 hour(s)).   Radiological Exams on Admission: Dg Chest 2 View  Result Date: 01/04/2017 CLINICAL DATA:  Chest pain off and on for a while. Worse today. Pain under the left breast radiating to the back and left shoulder and left arm. Numbness in the lips and left arm. Dizziness and nausea. EXAM: CHEST  2 VIEW COMPARISON:  06/28/2016 FINDINGS: Normal heart size and pulmonary vascularity. No focal airspace disease or consolidation in the lungs. No blunting of costophrenic angles. No pneumothorax. Mediastinal contours appear intact. Surgical clips in the right axilla. Calcified and tortuous aorta. Degenerative changes in the spine. IMPRESSION: No evidence of active pulmonary disease.  Aortic atherosclerosis. Electronically Signed   By: Lucienne Capers M.D.   On: 01/04/2017 19:59    EKG: Independently reviewed. Normal sinus rhythm.   Assessment/Plan  1. Chest pain, hx of CAD  - Pt  presents with several hours of chest pain that began while doing housework, was associated with nausea, SOB, and diaphoresis, and eventually eased off  - She reports hx of cath in 1990's with 2 blockages, no stents  - ED workup includes undetectable troponin, unremarkable CXR, and EKG with NSR and no acute ST-T abnormality  - She was treated with ASA and NTG x1 in ED   - She was hypertensive in ED and treated with labetalol  - Plan to continue cardiac monitoring, obtain serial troponin measurements, repeat EKG, continue statin, ASA, and beta-blocker    2. Hypertension  - Previously treated  with losartan, but reports being taken off d/t orthostasis  - BP elevated in ED and treated with labetalol  - Continue to treat with beta-blocker prn given concern for ACS as above   3. CKD stage III  - SCr is 1.14 on admission, lower than priors   4. Hx of breast cancer, right - Status-post lumpectomy  - Continue Arimidex    5. Anxiety  - Stable  - Continue Lexapro and Valium     DVT prophylaxis: sq Lovenox Code Status: Full  Family Communication: Discussed with patient Disposition Plan: Observe on telemetry Consults called: Cardiology consulted through Epic  Admission status: Observation     Vianne Bulls, MD [facility] Pager [phone]  If 7PM-7AM, please contact night-coverage [email or web address] Password [email or web address]  01/05/2017, 12:05 AM

## 2017-01-05 NOTE — Progress Notes (Signed)
Pt complains of severe headache and shoulder pain radiating down her both arms. Eulas Post, PA at bedside. Tylenol and Ntg given, see MAR.

## 2017-01-05 NOTE — Progress Notes (Signed)
Informed pt of change of plan NPO till after stress test.

## 2017-01-05 NOTE — ED Notes (Signed)
Pain relief noted after giving one ntg SL.  Bp also noted to come down significantly.

## 2017-01-05 NOTE — Consult Note (Signed)
Cardiology Consultation:   Patient ID: Brittany Kirk; [id number]; [birth year]   Admit date: 01/04/2017 Date of Consult: 01/05/2017  Primary Care Provider: Susy Frizzle, MD Primary Cardiologist: Dr. Gwenlyn Found Primary Electrophysiologist:  None   Patient Profile:   Brittany Kirk is a 71 y.o. female with a hx of CAD, emphysema, HTN and HLD who is being seen today for the evaluation of chest pain at the request of Dr. Myna Hidalgo.  History of Present Illness:   Brittany Kirk is a 71 yo female with PMH of HTN, HLD, GERD, CKD stage III, breast CA s/p lumpectomy and carotid artery disease. He is being followed by Dr. Gwenlyn Found as outpatient. She had a remote artery catheterization by Dr. Lia Foyer, however no angioplasty was performed. Based on remote note by Dr. Verl Blalock, it appears, she had a cardiac catheterization on 09/20/2008 which showed preserved LV function, moderate calcification in the coronary arteries, high-grade ostial ramus intermedius at trifurcation location involving LAD, ramus and the AV circumflex. Medical therapy was pursued as lesion was not ideal for percutaneous intervention given his ostial location and it's relationship to the LAD and the left circumflex interface. (see cath report below) Her last stress test was performed on 01/24/2016 which showed EF 70%, no ischemia or perfusion defect noted. Overall low risk study.   According to the patient, she started having some chest discomfort, fatigue and dyspnea in the past several weeks. She describes 2 different type of chest pain, one is a substernal/epigastric sharp stabbing pain that lasted a few seconds at a time. A second type of chest discomfort is actually under the left armpit. She has some numbness and tingling in her left arm and finger. She says the symptoms usually last a few seconds up to a minute, resolved with resting and typically occur during physical exertion.   Past Medical History:  Diagnosis Date  . Allergic rhinitis     . Asymptomatic bilateral carotid artery stenosis   . CAD (coronary artery disease)   . Cancer ([place])   . Complication of anesthesia    sore throat after a surgery  . Depression   . Emphysema    no longer uses inhalers  . Gallstones    nausea, pain upper right abdomen  . GERD (gastroesophageal reflux disease)   . H/O hiatal hernia   . History of skin cancer   . Hyperlipidemia   . Hypertension   . Shortness of breath    sob if walks up flight of stairs  . Tobacco user   . Wears dentures    top    Past Surgical History:  Procedure Laterality Date  . BREAST LUMPECTOMY Right 2015  . CHOLECYSTECTOMY  07/08/2012   Procedure: LAPAROSCOPIC CHOLECYSTECTOMY WITH INTRAOPERATIVE CHOLANGIOGRAM;  Surgeon: Gayland Curry, MD,FACS;  Location: [facility];  Service: General;  Laterality: N/A;  Laparoscopic Cholecystectomy with Intraoperative Cholangiogram  . COLONOSCOPY    . HAND SURGERY Left    wrist  . TONSILECTOMY, ADENOIDECTOMY, BILATERAL MYRINGOTOMY AND TUBES    . TUBAL LIGATION       Inpatient Medications: Scheduled Meds: . anastrozole  1 mg Oral Daily  . aspirin  81 mg Oral Daily  . atorvastatin  40 mg Oral QHS  . cyanocobalamin  250 mcg Oral Daily  . diazepam  5 mg Oral QHS  . enoxaparin (LOVENOX) injection  40 mg Subcutaneous QHS  . erythromycin  1 application Left Eye QHS  . escitalopram  20 mg Oral Daily  .  metoCLOPramide  5 mg Oral TID AC  . multivitamin with minerals  1 tablet Oral Daily  . pantoprazole  40 mg Oral Daily   Continuous Infusions:  PRN Meds: acetaminophen, labetalol, morphine injection, nitroGLYCERIN, ondansetron (ZOFRAN) IV  Allergies:    Allergies  Allergen Reactions  . Penicillins Anaphylaxis, Itching, Swelling and Rash    Has patient had a PCN reaction causing immediate rash, facial/tongue/throat swelling, SOB or lightheadedness with hypotension: Yes Has patient had a PCN reaction causing severe rash involving mucus membranes or skin necrosis: No Has  patient had a PCN reaction that required hospitalization: Unk Has patient had a PCN reaction occurring within the last 10 years: No If all of the above answers are "NO", then may proceed with Cephalosporin use.   . Cefaclor Itching, Swelling and Rash  . Latex Itching and Rash  . Metronidazole Itching, Swelling and Rash  . Naproxen Itching, Swelling and Rash  . Nsaids Itching, Swelling and Rash    Ibuprofen IS tolerated  . Septra [Bactrim] Itching, Swelling and Rash  . Sulfonamide Derivatives Itching, Swelling and Rash  . Tape Itching and Rash    Social History:   Social History   Social History  . Marital status: Married    Spouse name: N/A  . Number of children: 1  . Years of education: N/A   Occupational History  . 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  Retired   Social History Main Topics  . Smoking status: Former Smoker    Packs/day: 0.50    Years: 40.00    Types: Cigarettes    Quit date: 02/13/2011  . Smokeless tobacco: Never Used  . Alcohol use No  . Drug use: No  . Sexual activity: Not on file   Other Topics Concern  . Not on file   Social History Narrative  . No narrative on file    Family History:   The patient's family history includes Aplastic anemia in her other; Arthritis in her mother; Diverticulitis in her sister; Heart disease in her unknown relative.  ROS:  Please see the history of present illness.  ROS  All other ROS reviewed and negative.     Physical Exam/Data:   Vitals:   01/05/17 0030 01/05/17 0100 01/05/17 0200 01/05/17 0412  BP: (!) 131/109 (!) 137/48 (!) 159/54 (!) 116/40  Pulse: 66 73  66  Resp: 19 (!) 22 (!) 22 20  Temp:   97.6 F (36.4 C) 98 F (36.7 C)  TempSrc:   Oral Oral  SpO2: 100% 100% 98% 98%  Weight:   139 lb 15.9 oz (63.5 kg)   Height:   5\' 1"  (1.549 m)    No intake or output data in the 24 hours ending 01/05/17 0946 Filed Weights   01/04/17 1849 01/05/17 0200  Weight: 141 lb (64 kg) 139 lb 15.9 oz (63.5 kg)   Body  mass index is 26.45 kg/m.  General:  Well nourished, well developed, in no acute distress HEENT: normal Lymph: no adenopathy Neck: no JVD Endocrine:  No thryomegaly Vascular: Right carotid bruit; FA pulses 2+ bilaterally without bruits  Cardiac:  normal S1, S2; RRR; 1+ systolic murmur in RUSB  Lungs:  clear to auscultation bilaterally, no wheezing, rhonchi or rales  Abd: soft, nontender, no hepatomegaly  Ext: no edema Musculoskeletal:  No deformities, BUE and BLE strength normal and equal Skin: warm and dry  Neuro:  CNs 2-12 intact, no focal abnormalities noted Psych:  Normal affect  EKG:  The EKG was personally reviewed and demonstrates:  Normal sinus rhythm, poor R wave progression anterior lead. Telemetry:  Telemetry was personally reviewed and demonstrates:  Normal sinus rhythm without ventricular ectopy.  Relevant CV Studies:   Cath 09/20/2008 09/20/2008: Cardiac Cath Findings:   CONCLUSIONS:   1. Preserved left ventricular function.   2. Moderate calcification of the coronary arteries.   3. High-grade ostial ramus intermedius at the trifurcation location       involving the LAD, ramus, and AV circumflex.      DISPOSITION:   1. We will have the patient follow up with Dr. Verl Blalock in the office.   2. We will initiate medical therapy.  The lesion is not ideal for       percutaneous intervention given its ostial location, and its       relationship to the LAD circumflex interface.  Compression of both       of these vessels is likely with percutaneous intervention.  The       disease does not appear to be extensive enough to recommend the       revascularization surgery at this point.  The initiation of medical       therapy would seem to be the optimal approach.  She is on beta       blockade, and also on cholesterol-lowering agents.  She has stopped       smoking.  Low-dose Imdur would also be considered.         Myoview 01/24/2016 Study Highlights     Nuclear  stress EF: 70%. No wall motion abnormalities  The left ventricular ejection fraction is hyperdynamic (>65%).  There was no ST segment deviation noted during stress.  This is a low risk study. No perfusion defects, no ischemia identified.    Carotid US 01/24/2016 Heterogeneous plaque, bilaterally. Progression of bilateral ICA disease, now in 60-79% range of stenosis. Normal subclavian arteries, bilaterally. Normal right vertebral artery with antegrade flow. Left vertebral artery is not visualized.   Laboratory Data:  Chemistry  Recent Labs Lab 01/04/17 1848 01/05/17 0556  NA 138 139  K 3.9 3.6  CL 105 107  CO2 24 27  GLUCOSE 90 99  BUN 18 17  CREATININE 1.14* 0.99  CALCIUM 9.1 8.6*  GFRNONAA 47* 55*  GFRAA 54* >60  ANIONGAP 9 5    No results for input(s): PROT, ALBUMIN, AST, ALT, ALKPHOS, BILITOT in the last 168 hours. Hematology  Recent Labs Lab 01/04/17 1848 01/05/17 0556  WBC 9.2 7.6  RBC 4.17 3.74*  HGB 12.9 11.6*  HCT 39.5 35.3*  MCV 94.7 94.4  MCH 30.9 31.0  MCHC 32.7 32.9  RDW 14.2 14.2  PLT 225 195   Cardiac Enzymes  Recent Labs Lab 01/05/17 0054 01/05/17 0556  TROPONINI <0.03 <0.03     Recent Labs Lab 01/04/17 1909  TROPIPOC 0.00    BNPNo results for input(s): BNP, PROBNP in the last 168 hours.  DDimer No results for input(s): DDIMER in the last 168 hours.  Radiology/Studies:  Dg Chest 2 View  Result Date: 01/04/2017 CLINICAL DATA:  Chest pain off and on for a while. Worse today. Pain under the left breast radiating to the back and left shoulder and left arm. Numbness in the lips and left arm. Dizziness and nausea. EXAM: CHEST  2 VIEW COMPARISON:  06/28/2016 FINDINGS: Normal heart size and pulmonary vascularity. No focal airspace disease or consolidation in the lungs. No blunting of costophrenic  angles. No pneumothorax. Mediastinal contours appear intact. Surgical clips in the right axilla. Calcified and tortuous aorta. Degenerative  changes in the spine. IMPRESSION: No evidence of active pulmonary disease.  Aortic atherosclerosis. Electronically Signed   By: Lucienne Capers M.D.   On: 01/04/2017 19:59    Assessment and Plan:   1. Chest pain: Occurs mainly during exertion, relieved by rest. However has both typical and atypical features. Only last a few seconds to a minute at a time. She has trifurcation lesion involving ostial ramus intermedius, LAD and the left circumflex based on previous cardiac catheterization. Last Myoview was in August 2017, given her symptom, it would be reasonable to pursue either stress test or car to catheterization. We'll discuss with M.D.  2. Hypertension: Blood pressure mildly elevated. On losartan  3. Hyperlipidemia: Continue 40 mg daily of Lipitor  4. Carotid artery disease: She has known moderate disease on in bilateral carotid artery based on previous carotid artery duplex on on 01/24/2016, she is due for 1 year repeat.   Hilbert Corrigan, [place]  01/05/2017 9:46 AM   Pt seen and examined  Agree with findings as noted by Janan Ridge  Pt is a 71 yo with known CAD, remote tobacco  PResents with a couple types of chest discomfort First is a stabbing epigastiric discomfort  NO associatiate with activity Second is a pressure or blanket sensation across chest   Thrird is pain in underamr Not associated with activity  Pt is not too active   ON exam, pt is a thin 71 yo in NAD   Neck JVP norma   Lungs have decreased airflow and some mild wheezing  Cardiac RRR No S3   Extwithout edema   Impression: Chest pain   A few different types  Most concerning is pressure across chest  Pt's lung exam is not normal.  Dicreased flow, mild wheeze  I am not convinced this is new    I would recomm gXT myovue  If neg pulmnoary eval  She has an appt with [facility] in August  Follow BP  Maximize Rx  Dorris Carnes

## 2017-01-05 NOTE — Discharge Summary (Signed)
[facility]  Physician Discharge Summary   Patient ID: Brittany Kirk MRN: [id number] DOB/AGE: 1946-04-08 71 y.o.  Admit date: 01/04/2017 Discharge date: 01/05/2017  PCP: Susy Frizzle, MD  DISCHARGE DIAGNOSES:  Active Problems:   Essential hypertension   CAD, NATIVE VESSEL   EMPHYSEMA   Breast cancer of upper-outer quadrant of right female breast ([place])   CKD (chronic kidney disease), stage III   Chest pain   GERD (gastroesophageal reflux disease)   RECOMMENDATIONS FOR OUTPATIENT FOLLOW UP: 1. Patient asked to increase the dose of her PPI to twice a day for now 2. Patient encouraged to keep her appointment with her pulmonologist. She was also asked to follow-up with her primary care physician next week.  DISCHARGE CONDITION: good  Diet recommendation: Heart healthy  Filed Weights   01/04/17 1849 01/05/17 0200  Weight: 64 kg (141 lb) 63.5 kg (139 lb 15.9 oz)    INITIAL HISTORY: 71 year old Caucasian female with a past medical history of hypertension, history of coronary artery disease managed medically, GERD, chronic kidney disease stage III, history of breast cancer status post lumpectomy, presented with chest pain. The onset of the pain was about 15-20 minutes after she ate a meal. Patient was evaluated in the ED and was hospitalized for further management.  Consultations:  Cardiology  Procedures: Nuclear stress test This was a low-risk study with normal EF.   HOSPITAL COURSE:   Patient admitted with chest pain. She has a history of coronary artery disease based on a cardiac catheterization done many years ago. She had some blockages but it was treated medically, no stents were placed. Patient had a negative stress test last year. Her symptoms were atypical. EKG did not show any ischemic changes. She ruled out for acute coronary syndrome by serial cardiac enzymes. She was seen by cardiology and underwent a stress test today which was a low-risk study.  Symptoms could've been GI in origin. Recommend increasing her PPI to twice a day. Patient has ambulated without any difficulty. Discussed with Dr. Harrington Challenger with cardiology. She is considered stable for discharge.  Her other medical issues including hypertension, chronic kidney disease stage III, history of breast cancer and anxiety disorder are all stable.  Okay for discharge home today.    PERTINENT LABS:  The results of significant diagnostics from this hospitalization (including imaging, microbiology, ancillary and laboratory) are listed below for reference.      Labs: Basic Metabolic Panel:  Recent Labs Lab 01/04/17 1848 01/05/17 0556  NA 138 139  K 3.9 3.6  CL 105 107  CO2 24 27  GLUCOSE 90 99  BUN 18 17  CREATININE 1.14* 0.99  CALCIUM 9.1 8.6*   CBC:  Recent Labs Lab 01/04/17 1848 01/05/17 0556  WBC 9.2 7.6  HGB 12.9 11.6*  HCT 39.5 35.3*  MCV 94.7 94.4  PLT 225 195   Cardiac Enzymes:  Recent Labs Lab 01/05/17 0054 01/05/17 0556 01/05/17 1350  TROPONINI <0.03 <0.03 <0.03     IMAGING STUDIES Dg Chest 2 View  Result Date: 01/04/2017 CLINICAL DATA:  Chest pain off and on for a while. Worse today. Pain under the left breast radiating to the back and left shoulder and left arm. Numbness in the lips and left arm. Dizziness and nausea. EXAM: CHEST  2 VIEW COMPARISON:  06/28/2016 FINDINGS: Normal heart size and pulmonary vascularity. No focal airspace disease or consolidation in the lungs. No blunting of costophrenic angles. No pneumothorax. Mediastinal contours appear intact. Surgical clips in  the right axilla. Calcified and tortuous aorta. Degenerative changes in the spine. IMPRESSION: No evidence of active pulmonary disease.  Aortic atherosclerosis. Electronically Signed   By: Lucienne Capers M.D.   On: 01/04/2017 19:59   Nm Myocar Multi W/spect W/wall Motion / Ef  Result Date: 01/05/2017  CLinically negative for ischemia  No EKG changes with infusion   LVEF 83%  Low risk scan     DISCHARGE EXAMINATION: Vitals:   01/05/17 1227 01/05/17 1232 01/05/17 1236 01/05/17 1241  BP: (!) 137/51 (!) 200/55 (!) 195/55 (!) 108/48  Pulse: 85 88 82 81  Resp:      Temp:      TempSrc:      SpO2:      Weight:      Height:       General appearance: alert, cooperative, appears stated age and no distress Resp: clear to auscultation bilaterally Cardio: regular rate and rhythm, S1, S2 normal, no murmur, click, rub or gallop GI: soft, non-tender; bowel sounds normal; no masses,  no organomegaly  DISPOSITION: Home  Discharge Instructions    Call MD for:  difficulty breathing, headache or visual disturbances    Complete by:  As directed    Call MD for:  extreme fatigue    Complete by:  As directed    Call MD for:  persistant dizziness or light-headedness    Complete by:  As directed    Call MD for:  persistant nausea and vomiting    Complete by:  As directed    Call MD for:  severe uncontrolled pain    Complete by:  As directed    Call MD for:  temperature >100.4    Complete by:  As directed    Diet - low sodium heart healthy    Complete by:  As directed    Discharge instructions    Complete by:  As directed    Please follow-up with your primary care provider next week. Take Nexium twice a day for now. Please be sure to keep your appointment with Dr. Lamonte Sakai.  You were cared for by a hospitalist during your hospital stay. If you have any questions about your discharge medications or the care you received while you were in the hospital after you are discharged, you can call the unit and asked to speak with the hospitalist on call if the hospitalist that took care of you is not available. Once you are discharged, your primary care physician will handle any further medical issues. Please note that NO REFILLS for any discharge medications will be authorized once you are discharged, as it is imperative that you return to your primary care physician (or  establish a relationship with a primary care physician if you do not have one) for your aftercare needs so that they can reassess your need for medications and monitor your lab values. If you do not have a primary care physician, you can call [phone] for a physician referral.   Increase activity slowly    Complete by:  As directed       ALLERGIES:  Allergies  Allergen Reactions  . Penicillins Anaphylaxis, Itching, Swelling and Rash    Has patient had a PCN reaction causing immediate rash, facial/tongue/throat swelling, SOB or lightheadedness with hypotension: Yes Has patient had a PCN reaction causing severe rash involving mucus membranes or skin necrosis: No Has patient had a PCN reaction that required hospitalization: Unk Has patient had a PCN reaction occurring within the last 10  years: No If all of the above answers are "NO", then may proceed with Cephalosporin use.   . Cefaclor Itching, Swelling and Rash  . Latex Itching and Rash  . Metronidazole Itching, Swelling and Rash  . Naproxen Itching, Swelling and Rash  . Nsaids Itching, Swelling and Rash    Ibuprofen IS tolerated  . Septra [Bactrim] Itching, Swelling and Rash  . Sulfonamide Derivatives Itching, Swelling and Rash  . Tape Itching and Rash     Current Discharge Medication List    CONTINUE these medications which have CHANGED   Details  esomeprazole (NEXIUM) 20 MG capsule Take 1 capsule (20 mg total) by mouth 2 (two) times daily before a meal. Qty: 60 capsule, Refills: 0      CONTINUE these medications which have NOT CHANGED   Details  anastrozole (ARIMIDEX) 1 MG tablet Take 1 tablet (1 mg total) by mouth daily. Qty: 90 tablet, Refills: 4    aspirin 81 MG tablet Take 81 mg by mouth daily.    atorvastatin (LIPITOR) 40 MG tablet Take 40 mg by mouth at bedtime.    Cyanocobalamin (VITAMIN B-12 PO) Take 1 tablet by mouth daily.    diazepam (VALIUM) 5 MG tablet Take 5 mg by mouth at bedtime Refills: 3      erythromycin (ROMYCIN) ophthalmic ointment Place 1 application into the left eye at bedtime. Qty: 3.5 g, Refills: 0    escitalopram (LEXAPRO) 20 MG tablet Take 20 mg by mouth daily.     metoCLOPramide (REGLAN) 5 MG tablet Take 5 mg by mouth 3 (three) times daily before meals.     Multiple Vitamin (MULTIVITAMIN) tablet Take 1 tablet by mouth daily.      STOP taking these medications     omeprazole (PRILOSEC) 20 MG capsule      losartan (COZAAR) 50 MG tablet          Follow-up Information    Susy Frizzle, MD. Schedule an appointment as soon as possible for a visit in 1 week(s).   Specialty:  Family Medicine Why:  to further discuss your symptoms Contact information: [place] [address] [place] [address] [phone]           TOTAL DISCHARGE TIME: 35 minutes  [facility] Pager [phone]  01/05/2017, 3:48 PM

## 2017-01-05 NOTE — Progress Notes (Signed)
Pt rates chest pain 4/10. States that her headache is getting better. Meng, PA remains at bedside. Pt reports pain as feeling like  "indigestion with pressure."

## 2017-01-18 ENCOUNTER — Ambulatory Visit (INDEPENDENT_AMBULATORY_CARE_PROVIDER_SITE_OTHER): Payer: Medicare Other | Admitting: Family Medicine

## 2017-01-18 ENCOUNTER — Encounter: Payer: Self-pay | Admitting: Family Medicine

## 2017-01-18 VITALS — BP 168/80 | HR 62 | Temp 98.5°F | Resp 14 | Ht 61.0 in | Wt 136.0 lb

## 2017-01-18 DIAGNOSIS — R499 Unspecified voice and resonance disorder: Secondary | ICD-10-CM | POA: Diagnosis not present

## 2017-01-18 NOTE — Progress Notes (Signed)
Subjective:    Patient ID: Brittany Kirk, female    DOB: [birth year], 71 y.o.   MRN: [id number]  HPI  12/31/16 The patient's nausea has finally improved after reducing the dose of her Lexapro. However over the last 2 days, she has developed an erythematous tender painful large stye on her left lower eyelid. She also reports 2 weeks of intermittent hoarseness, tenderness in the right side of her throat, postnasal drip, and occasional right ear pain. Exam today is unremarkable. There is no erythema or exudate in the posterior oropharynx. There is no lymphadenopathy or palpable mass on examination of the neck. Her tympanic membrane appears gray and normal in her right ear. Her voice today is normal. She states that her voice has been cracking occasionally over the last 2 weeks.  AT that time, my plan was: I will treat the large stye on her left lower eyelid with erythromycin ointment applied at night for the next week and warm compresses 3 times a day. I believe that she may have a mild file laryngitis causing the sore throat and the occasional hoarseness. It is been 10-14 days in duration. Today her exam is unremarkable. I have recommended clinical monitoring for the next 2 weeks. If still persistent at that time, I would recommend an ENT consultation for laryngoscopy. Patient is comfortable with that plan  01/18/17 Patient went to the emergency room towards the end of July with atypical chest pain. Stress test was normal. She was diagnosed with atypical chest pain likely from GERD. Omeprazole switch to Nexium. She is here today for follow-up. She is concerned about her blood pressures. She has approximately 23 different blood pressure readings over the last 2 weeks. Of the 23 blood pressure readings the vast majority range between 110 and 130/50-60. I reassured the patient that these are excellent blood pressure readings and require no further treatment. They did discontinue the patient's losartan in the  hospital and she doesn't seem to require that. She's only had one blood pressure significantly above 563 systolic out of the 23. Today's blood pressure is elevated however the blood pressure she's getting home are much better. Her concern is the persistent hoarseness. Today her voice is completely normal. She states that her hoarseness comes and goes and fluctuates. She also complains of pain on the right side of her throat near her vocal cords. She does have a remote history of smoking. She is concerned now because the sensation and intermittent hoarseness have become recurrent over the last 5-6 weeks and do not seem to be improving Past Medical History:  Diagnosis Date  . Allergic rhinitis   . Asymptomatic bilateral carotid artery stenosis   . CAD (coronary artery disease)   . Cancer ([place])   . Complication of anesthesia    sore throat after a surgery  . Depression   . Emphysema    no longer uses inhalers  . Gallstones    nausea, pain upper right abdomen  . GERD (gastroesophageal reflux disease)   . H/O hiatal hernia   . History of skin cancer   . Hyperlipidemia   . Hypertension   . Shortness of breath    sob if walks up flight of stairs  . Tobacco user   . Wears dentures    top   Past Surgical History:  Procedure Laterality Date  . BREAST LUMPECTOMY Right 2015  . CHOLECYSTECTOMY  07/08/2012   Procedure: LAPAROSCOPIC CHOLECYSTECTOMY WITH INTRAOPERATIVE CHOLANGIOGRAM;  Surgeon: Gayland Curry,  MD,FACS;  Location: [facility];  Service: General;  Laterality: N/A;  Laparoscopic Cholecystectomy with Intraoperative Cholangiogram  . COLONOSCOPY    . HAND SURGERY Left    wrist  . TONSILECTOMY, ADENOIDECTOMY, BILATERAL MYRINGOTOMY AND TUBES    . TUBAL LIGATION     Current Outpatient Prescriptions on File Prior to Visit  Medication Sig Dispense Refill  . anastrozole (ARIMIDEX) 1 MG tablet Take 1 tablet (1 mg total) by mouth daily. 90 tablet 4  . aspirin 81 MG tablet Take 81 mg by mouth daily.     Marland Kitchen atorvastatin (LIPITOR) 40 MG tablet Take 40 mg by mouth at bedtime.    . Cyanocobalamin (VITAMIN B-12 PO) Take 1 tablet by mouth daily.    . diazepam (VALIUM) 5 MG tablet Take 5 mg by mouth at bedtime  3  . erythromycin (ROMYCIN) ophthalmic ointment Place 1 application into the left eye at bedtime. 3.5 g 0  . escitalopram (LEXAPRO) 20 MG tablet Take 20 mg by mouth daily.     Marland Kitchen esomeprazole (NEXIUM) 20 MG capsule Take 1 capsule (20 mg total) by mouth 2 (two) times daily before a meal. 60 capsule 0  . Multiple Vitamin (MULTIVITAMIN) tablet Take 1 tablet by mouth daily.    . metoCLOPramide (REGLAN) 5 MG tablet Take 5 mg by mouth 3 (three) times daily before meals.      No current facility-administered medications on file prior to visit.    Allergies  Allergen Reactions  . Penicillins Anaphylaxis, Itching, Swelling and Rash    Has patient had a PCN reaction causing immediate rash, facial/tongue/throat swelling, SOB or lightheadedness with hypotension: Yes Has patient had a PCN reaction causing severe rash involving mucus membranes or skin necrosis: No Has patient had a PCN reaction that required hospitalization: Unk Has patient had a PCN reaction occurring within the last 10 years: No If all of the above answers are "NO", then may proceed with Cephalosporin use.   . Cefaclor Itching, Swelling and Rash  . Latex Itching and Rash  . Metronidazole Itching, Swelling and Rash  . Naproxen Itching, Swelling and Rash  . Nsaids Itching, Swelling and Rash    Ibuprofen IS tolerated  . Septra [Bactrim] Itching, Swelling and Rash  . Sulfonamide Derivatives Itching, Swelling and Rash  . Tape Itching and Rash   Social History   Social History  . Marital status: Married    Spouse name: N/A  . Number of children: 1  . Years of education: N/A   Occupational History  . 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  Retired   Social History Main Topics  . Smoking status: Former Smoker    Packs/day: 0.50      Years: 40.00    Types: Cigarettes    Quit date: 02/13/2011  . Smokeless tobacco: Never Used  . Alcohol use No  . Drug use: No  . Sexual activity: Not on file   Other Topics Concern  . Not on file   Social History Narrative  . No narrative on file      Review of Systems  All other systems reviewed and are negative.      Objective:   Physical Exam  HENT:  Right Ear: External ear normal.  Left Ear: External ear normal.  Nose: Nose normal.  Mouth/Throat: Oropharynx is clear and moist. No oropharyngeal exudate.  Neck: Neck supple. No tracheal deviation present. No thyromegaly present.  Cardiovascular: Normal rate, regular rhythm and normal heart sounds.  Pulmonary/Chest: Effort normal and breath sounds normal. No stridor. No respiratory distress. She has no wheezes. She has no rales. She exhibits no tenderness.  Abdominal: Soft. Normal appearance and bowel sounds are normal. She exhibits no distension. There is no tenderness. There is no rebound.  Musculoskeletal: She exhibits no edema.  Lymphadenopathy:    She has no cervical adenopathy.  Vitals reviewed.         Assessment & Plan:  Hoarseness or changing voice - Plan: Ambulatory referral to ENT I suspect that the patient's hoarseness could be related to gastroesophageal reflux disease. She is currently taking Nexium 20 mg twice daily. However I will consult ENT for laryngoscopy to rule out any structural lesions particularly on the right side of her vocal cords where she is experiencing tenderness. On examination today there is no palpable abnormality in this area. There is no lymphadenopathy in this area. Her blood pressures are excellent. Ignoring today's blood pressure in the clinic the 23 readings from home are excellent and in fact some of them are even borderline low. Therefore I will not resume the patient's losartan. Await the results of the ENT consultation

## 2017-01-24 ENCOUNTER — Telehealth: Payer: Self-pay

## 2017-01-24 MED ORDER — ANASTROZOLE 1 MG PO TABS: 1.0000 mg | ORAL_TABLET | Freq: Every day | ORAL | 3 refills | Status: DC

## 2017-01-24 NOTE — Telephone Encounter (Signed)
Pharmacy called for anastrozole refil

## 2017-01-30 ENCOUNTER — Other Ambulatory Visit: Payer: Self-pay | Admitting: *Deleted

## 2017-01-30 MED ORDER — ANASTROZOLE 1 MG PO TABS: 1.0000 mg | ORAL_TABLET | Freq: Every day | ORAL | 3 refills | Status: DC

## 2017-01-31 ENCOUNTER — Other Ambulatory Visit: Payer: Self-pay | Admitting: Oncology

## 2017-01-31 DIAGNOSIS — Z853 Personal history of malignant neoplasm of breast: Secondary | ICD-10-CM

## 2017-02-06 ENCOUNTER — Ambulatory Visit: Payer: Medicare Other | Admitting: Emergency Medicine

## 2017-02-06 ENCOUNTER — Ambulatory Visit
Admission: RE | Admit: 2017-02-06 | Discharge: 2017-02-06 | Disposition: A | Discharge status: Home / Self Care | Payer: Medicare Other | Source: Ambulatory Visit | Attending: Oncology | Admitting: Oncology

## 2017-02-06 DIAGNOSIS — Z853 Personal history of malignant neoplasm of breast: Secondary | ICD-10-CM

## 2017-02-06 DIAGNOSIS — R928 Other abnormal and inconclusive findings on diagnostic imaging of breast: Secondary | ICD-10-CM | POA: Diagnosis not present

## 2017-02-13 ENCOUNTER — Ambulatory Visit (HOSPITAL_COMMUNITY)
Admission: RE | Admit: 2017-02-13 | Discharge: 2017-02-13 | Disposition: A | Discharge status: Home / Self Care | Payer: Medicare Other | Source: Ambulatory Visit | Attending: Cardiology | Admitting: Cardiology

## 2017-02-13 ENCOUNTER — Inpatient Hospital Stay (HOSPITAL_COMMUNITY): Admission: RE | Admit: 2017-02-13 | Payer: Medicare Other | Source: Ambulatory Visit

## 2017-02-13 DIAGNOSIS — I779 Disorder of arteries and arterioles, unspecified: Secondary | ICD-10-CM | POA: Insufficient documentation

## 2017-02-13 DIAGNOSIS — I739 Peripheral vascular disease, unspecified: Secondary | ICD-10-CM

## 2017-02-13 DIAGNOSIS — I6523 Occlusion and stenosis of bilateral carotid arteries: Secondary | ICD-10-CM | POA: Diagnosis not present

## 2017-02-15 DIAGNOSIS — R490 Dysphonia: Secondary | ICD-10-CM | POA: Insufficient documentation

## 2017-02-15 DIAGNOSIS — R1314 Dysphagia, pharyngoesophageal phase: Secondary | ICD-10-CM | POA: Insufficient documentation

## 2017-02-15 DIAGNOSIS — K219 Gastro-esophageal reflux disease without esophagitis: Secondary | ICD-10-CM | POA: Insufficient documentation

## 2017-02-15 DIAGNOSIS — Z87891 Personal history of nicotine dependence: Secondary | ICD-10-CM | POA: Diagnosis not present

## 2017-02-19 ENCOUNTER — Encounter: Payer: Self-pay | Admitting: Cardiovascular Disease

## 2017-02-19 ENCOUNTER — Ambulatory Visit (INDEPENDENT_AMBULATORY_CARE_PROVIDER_SITE_OTHER): Payer: Medicare Other | Admitting: Cardiovascular Disease

## 2017-02-19 VITALS — BP 110/64 | HR 68 | Ht 61.0 in | Wt 133.0 lb

## 2017-02-19 DIAGNOSIS — I779 Disorder of arteries and arterioles, unspecified: Secondary | ICD-10-CM

## 2017-02-19 DIAGNOSIS — I10 Essential (primary) hypertension: Secondary | ICD-10-CM | POA: Diagnosis not present

## 2017-02-19 DIAGNOSIS — I208 Other forms of angina pectoris: Secondary | ICD-10-CM | POA: Diagnosis not present

## 2017-02-19 DIAGNOSIS — E782 Mixed hyperlipidemia: Secondary | ICD-10-CM | POA: Diagnosis not present

## 2017-02-19 DIAGNOSIS — I209 Angina pectoris, unspecified: Secondary | ICD-10-CM | POA: Diagnosis not present

## 2017-02-19 DIAGNOSIS — I739 Peripheral vascular disease, unspecified: Principal | ICD-10-CM

## 2017-02-19 NOTE — Assessment & Plan Note (Signed)
History of essential hypertension blood pressure measured 110/64. She is on no antihypertensive medications.

## 2017-02-19 NOTE — Assessment & Plan Note (Signed)
History of bilateral internal carotid artery stenosis right greater than left with some progression on the right side over the past year. She is neurologically asymptomatic and has bilateral carotid bruits. We will continue to monitor noninvasively on an annual basis.

## 2017-02-19 NOTE — Assessment & Plan Note (Signed)
History of atypical chest pain the low risk Myoview 01/05/17 during a recent hospitalization for chest pain. She also had a negative Myoview one year ago.

## 2017-02-19 NOTE — Assessment & Plan Note (Signed)
History of hyperlipidemia on statin therapy with lipid profile performed on 02/20/16 revealed total cholesterol 181, LDL 107 and HDL of 52.

## 2017-02-19 NOTE — Patient Instructions (Signed)
Medication Instructions: Your physician recommends that you continue on your current medications as directed. Please refer to the Current Medication list given to you today.  Labwork: Your physician recommends that you return for a FASTING lipid profile and hepatic function panel.   Testing/Procedures: Your physician has requested that you have a carotid duplex in 1 year prior to visit. This test is an ultrasound of the carotid arteries in your neck. It looks at blood flow through these arteries that supply the brain with blood. Allow one hour for this exam. There are no restrictions or special instructions.  Follow-Up: Your physician wants you to follow-up in: 1 year with Dr. Gwenlyn Found. You will receive a reminder letter in the mail two months in advance. If you don't receive a letter, please call our office to schedule the follow-up appointment.  If you need a refill on your cardiac medications before your next appointment, please call your pharmacy.

## 2017-02-19 NOTE — Progress Notes (Signed)
02/19/2017 Colin Benton Schader   09-Jan-1946  [id number]  Primary Physician Pickard, Cammie Mcgee, MD Primary Cardiologist: Lorretta Harp MD [name], [place]  HPI:  Brittany Kirk is a 71 y.o. 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 She was referred by Dr. Dennard Schaumann for cardiovascular evaluation because of chest pain. I last saw her in the office 02/14/16. Risk factors include 25 pack years of tobacco abuse having quit 3 years ago. She has treated hypertension as well as a strong family history of heart disease with multiple siblings who died at a premature age of myocardial infarction's. She has never had a heart attack or stroke. She has had a remote cardiac catheterization by Dr. Lia Foyer but no intervention was performed. She was complaining of some substernal chest pain with chronic nausea. This has since some sided after beginning on a proton pump inhibitor. She had a negative stress test 01/24/16 and again during a recent consultation for chest pain 01/05/17.   Current Meds  Medication Sig  . anastrozole (ARIMIDEX) 1 MG tablet Take 1 tablet (1 mg total) by mouth daily.  Marland Kitchen aspirin 81 MG tablet Take 81 mg by mouth daily.  Marland Kitchen atorvastatin (LIPITOR) 40 MG tablet Take 40 mg by mouth at bedtime.  . Cyanocobalamin (VITAMIN B-12 PO) Take 1 tablet by mouth daily.  . diazepam (VALIUM) 5 MG tablet Take 5 mg by mouth at bedtime  . escitalopram (LEXAPRO) 20 MG tablet Take 20 mg by mouth daily.   . Multiple Vitamin (MULTIVITAMIN) tablet Take 1 tablet by mouth daily.  . pantoprazole (PROTONIX) 40 MG tablet Take 1 tablet by mouth 2 (two) times daily.     Allergies  Allergen Reactions  . Penicillins Anaphylaxis, Itching, Swelling and Rash    Has patient had a PCN reaction causing immediate rash, facial/tongue/throat swelling, SOB or lightheadedness with hypotension: Yes Has patient  had a PCN reaction causing severe rash involving mucus membranes or skin necrosis: No Has patient had a PCN reaction that required hospitalization: Unk Has patient had a PCN reaction occurring within the last 10 years: No If all of the above answers are "NO", then may proceed with Cephalosporin use.   . Cefaclor Itching, Swelling and Rash  . Latex Itching and Rash  . Metronidazole Itching, Swelling and Rash  . Naproxen Itching, Swelling and Rash  . Nsaids Itching, Swelling and Rash    Ibuprofen IS tolerated  . Septra [Bactrim] Itching, Swelling and Rash  . Sulfonamide Derivatives Itching, Swelling and Rash  . Tape Itching and Rash    Social History   Social History  . Marital status: Married    Spouse name: N/A  . Number of children: 1  . Years of education: N/A   Occupational History  . 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  Retired   Social History Main Topics  . Smoking status: Former Smoker    Packs/day: 0.50    Years: 40.00    Types: Cigarettes    Quit date: 02/13/2011  . Smokeless tobacco: Never Used  . Alcohol use No  . Drug use: No  . Sexual activity: Not on file   Other Topics Concern  . Not on file   Social History Narrative  . No narrative on file     Review of Systems: General: negative for chills, fever, night sweats or weight changes.  Cardiovascular: negative for chest pain, dyspnea on exertion, edema, orthopnea, palpitations, paroxysmal nocturnal dyspnea or shortness of breath Dermatological: negative for rash Respiratory: negative for cough or wheezing Urologic: negative for hematuria Abdominal: negative for nausea, vomiting, diarrhea, bright red blood per rectum, melena, or hematemesis Neurologic: negative for visual changes, syncope, or dizziness All other systems reviewed and are otherwise negative except as noted above.    Blood pressure 110/64, pulse 68, height 5\' 1"  (1.549 m), weight 133 lb (60.3 kg).  General appearance: alert and no  distress Neck: no adenopathy, no carotid bruit, no JVD, supple, symmetrical, trachea midline and thyroid not enlarged, symmetric, no tenderness/mass/nodules Lungs: clear to auscultation bilaterally Heart: regular rate and rhythm, S1, S2 normal, no murmur, click, rub or gallop Extremities: extremities normal, atraumatic, no cyanosis or edema  EKG not performed today  ASSESSMENT AND PLAN:   HYPERLIPIDEMIA TYPE IIB / III History of hyperlipidemia on statin therapy with lipid profile performed on 02/20/16 revealed total cholesterol 181, LDL 107 and HDL of 52.  Essential hypertension History of essential hypertension blood pressure measured 110/64. She is on no antihypertensive medications.  Chest pain History of atypical chest pain the low risk Myoview 01/05/17 during a recent hospitalization for chest pain. She also had a negative Myoview one year ago.  Bilateral carotid artery disease ([facility]) History of bilateral internal carotid artery stenosis right greater than left with some progression on the right side over the past year. She is neurologically asymptomatic and has bilateral carotid bruits. We will continue to monitor noninvasively on an annual basis.      Lorretta Harp MD FACP,FACC,FAHA, [facility] 02/19/2017 2:41 PM

## 2017-02-20 ENCOUNTER — Other Ambulatory Visit: Payer: Self-pay | Admitting: Cardiovascular Disease

## 2017-02-20 DIAGNOSIS — I779 Disorder of arteries and arterioles, unspecified: Secondary | ICD-10-CM

## 2017-02-20 DIAGNOSIS — I739 Peripheral vascular disease, unspecified: Principal | ICD-10-CM

## 2017-02-25 ENCOUNTER — Telehealth: Payer: Self-pay

## 2017-02-25 ENCOUNTER — Ambulatory Visit (HOSPITAL_BASED_OUTPATIENT_CLINIC_OR_DEPARTMENT_OTHER): Payer: Medicare Other | Admitting: Oncology

## 2017-02-25 ENCOUNTER — Other Ambulatory Visit (HOSPITAL_BASED_OUTPATIENT_CLINIC_OR_DEPARTMENT_OTHER): Payer: Medicare Other

## 2017-02-25 VITALS — BP 129/57 | HR 66 | Temp 98.4°F | Resp 18 | Ht 61.0 in | Wt 132.6 lb

## 2017-02-25 DIAGNOSIS — M810 Age-related osteoporosis without current pathological fracture: Secondary | ICD-10-CM

## 2017-02-25 DIAGNOSIS — C50411 Malignant neoplasm of upper-outer quadrant of right female breast: Secondary | ICD-10-CM

## 2017-02-25 DIAGNOSIS — Z79811 Long term (current) use of aromatase inhibitors: Secondary | ICD-10-CM

## 2017-02-25 DIAGNOSIS — M818 Other osteoporosis without current pathological fracture: Secondary | ICD-10-CM

## 2017-02-25 DIAGNOSIS — Z170 Estrogen receptor positive status [ER+]: Secondary | ICD-10-CM

## 2017-02-25 LAB — COMPREHENSIVE METABOLIC PANEL
ALT: 10 U/L (ref 0–55)
AST: 16 U/L (ref 5–34)
Albumin: 3.5 g/dL (ref 3.5–5.0)
Alkaline Phosphatase: 74 U/L (ref 40–150)
Anion Gap: 9 mEq/L (ref 3–11)
BUN: 14.1 mg/dL (ref 7.0–26.0)
CO2: 26 mEq/L (ref 22–29)
Calcium: 9.2 mg/dL (ref 8.4–10.4)
Chloride: 106 mEq/L (ref 98–109)
Creatinine: 1 mg/dL (ref 0.6–1.1)
EGFR: 55 mL/min/{1.73_m2} — ABNORMAL LOW (ref >90)
Glucose: 110 mg/dl (ref 70–140)
Potassium: 3.9 mEq/L (ref 3.5–5.1)
Sodium: 141 mEq/L (ref 136–145)
Total Bilirubin: 0.56 mg/dL (ref 0.20–1.20)
Total Protein: 6.5 g/dL (ref 6.4–8.3)

## 2017-02-25 NOTE — Telephone Encounter (Signed)
Printed patient avs and calender for next years f/u. Per 9/17 los.

## 2017-02-25 NOTE — Addendum Note (Signed)
Addended by: Chauncey Cruel on: 02/25/2017 04:29 PM   Modules accepted: Orders

## 2017-02-25 NOTE — Progress Notes (Signed)
[place]  [phone] [phone]     ID: Brittany Kirk DOB: 05/11/1946  MR#: [id number]  PIR#:[id number]  Patient Care Team: Susy Frizzle, MD as PCP - General (Family Medicine) Elsie Stain, MD (Pulmonary Disease) Lakishia Bourassa, Virgie Dad, MD as Consulting Physician (Oncology) Thea Silversmith, MD (Inactive) as Consulting Physician (Radiation Oncology) Dian Queen, MD as Consulting Physician (Obstetrics and Gynecology) OTHER MD: Griselda Miner M.D.  CHIEF COMPLAINT: Early stage estrogen receptor positive breast cancer  CURRENT TREATMENT: anastrozole   BREAST CANCER HISTORY: From the earlier intake note:  Brittany Kirk underwent routine screening mammography August of 2015 showing a possible mass in the right breast. On 01/19/2014 at the breast center she underwent right diagnostic mammography and ultrasonography. The breast density was category B. The mass appeared spiculated on spot compression views and an ultrasound it was nearly isoechoic. It was located at the 10:00 position 4 cm from the nipple and measured 7.5 mm by ultrasonography. Ultrasound of the right axilla was negative.  On 01/26/2014 the patient underwent biopsy of the mass in question and this showed ([id number]) and invasive ductal carcinoma, grade 1, estrogen receptor 100% positive, progesterone receptor 60% positive, both with strong staining intensity, with an MIB-1 of 12%, and no HER-2 modification, the signals ratio being 1.38 and the number per cell 3.30.  On 02/19/2014 the patient underwent right lumpectomy. Sentinel lymph node sampling was attempted but failed and accordingly the patient received a full axillary lymph node dissection. The final pathology from this procedure (SZ [id number]. [id number]) showed an invasive ductal carcinoma, 1.4 cm, grade 1, with negative margins, and all 19 lymph nodes clear.  Her subsequent history is as detailed below  INTERVAL HISTORY: Lashina  returns today for follow-up and treatment of her estrogen receptor positive breast cancer. She continues on anastrozole, with no complications at this time. She has no issues with hot flashes, but has issues with vaginal dryness that she uses OTC vaginal lubricant (RepHresh) for.    REVIEW OF SYSTEMS: Brittany Kirk reports weight loss, fatigue, decreased appetite. She reports burning sensation to her chest wall that wraps around to her back and radiates to her bilateral arms that she notices upon waking up. Pt notes mild warmness sensation to her chest wall and bilateral arms during the day. She notes transient blurred vision that has since resolved. She denies unusual headaches, nausea, vomiting, or dizziness. There has been no unusual cough, phlegm production, or pleurisy. This been no change in bowel or bladder habits. She denies bleeding, rash, or fever. A detailed review of systems was otherwise negative.    PAST MEDICAL HISTORY: Past Medical History:  Diagnosis Date  . Allergic rhinitis   . Asymptomatic bilateral carotid artery stenosis   . CAD (coronary artery disease)   . Cancer ([place])   . Complication of anesthesia    sore throat after a surgery  . Depression   . Emphysema    no longer uses inhalers  . Gallstones    nausea, pain upper right abdomen  . GERD (gastroesophageal reflux disease)   . H/O hiatal hernia   . History of skin cancer   . Hyperlipidemia   . Hypertension   . Shortness of breath    sob if walks up flight of stairs  . Tobacco user   . Wears dentures    top    PAST SURGICAL HISTORY: Past Surgical History:  Procedure Laterality Date  . BREAST LUMPECTOMY Right 2015  . CHOLECYSTECTOMY  07/08/2012   Procedure: LAPAROSCOPIC CHOLECYSTECTOMY WITH INTRAOPERATIVE CHOLANGIOGRAM;  Surgeon: Gayland Curry, MD,FACS;  Location: [facility];  Service: General;  Laterality: N/A;  Laparoscopic Cholecystectomy with Intraoperative Cholangiogram  . COLONOSCOPY    . HAND SURGERY Left     wrist  . TONSILECTOMY, ADENOIDECTOMY, BILATERAL MYRINGOTOMY AND TUBES    . TUBAL LIGATION      FAMILY HISTORY Family History  Problem Relation Age of Onset  . Arthritis Mother   . Heart disease Unknown        5 brothers and 2 sister  . Diverticulitis Sister   . Aplastic anemia Other    the patient's father died at the age of 78. The patient's mother died at the age of 63 from pancreatic cancer which had been diagnosed a year earlier. The patient had 6 brothers, 2 sisters. There is no history of breast or ovarian cancer in the family.  GYNECOLOGIC HISTORY:  No LMP recorded. Patient is postmenopausal. Menarche age 85, first live birth age 4. The patient is GX P1. She went through menopause at approximately age 45. She did not take hormone replacement.   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 She has a son from her first marriage, Brittany Kirk, who works in [place] as a Dealer. The patient has 2 biological grandchildren, including a 74 year old grandson who is undergoing bone marrow transplant at [facility] for aplastic anemia 03/25/2014. Brittany Kirk's current, second husband, Brittany Kirk"), worked in Arboriculturist. He is now retired. He also has 2 grandchildren from an earlier marriage. He is a Airline pilot. The patient attends a local [place].    ADVANCED DIRECTIVES: Not in place   HEALTH MAINTENANCE: Social History  Substance Use Topics  . Smoking status: Former Smoker    Packs/day: 0.50    Years: 40.00    Types: Cigarettes    Quit date: 02/13/2011  . Smokeless tobacco: Never Used  . Alcohol use No     Colonoscopy:  PAP:  Bone density: At Dr. Christen Butter; osteopenia  Lipid panel:  Allergies  Allergen Reactions  . Penicillins Anaphylaxis, Itching, Swelling and Rash    Has patient had a PCN reaction causing immediate rash, facial/tongue/throat swelling, SOB or lightheadedness with  hypotension: Yes Has patient had a PCN reaction causing severe rash involving mucus membranes or skin necrosis: No Has patient had a PCN reaction that required hospitalization: Unk Has patient had a PCN reaction occurring within the last 10 years: No If all of the above answers are "NO", then may proceed with Cephalosporin use.   . Cefaclor Itching, Swelling and Rash  . Latex Itching and Rash  . Metronidazole Itching, Swelling and Rash  . Naproxen Itching, Swelling and Rash  . Nsaids Itching, Swelling and Rash    Ibuprofen IS tolerated  . Septra [Bactrim] Itching, Swelling and Rash  . Sulfonamide Derivatives Itching, Swelling and Rash  . Tape Itching and Rash    Current Outpatient Prescriptions  Medication Sig Dispense Refill  . anastrozole (ARIMIDEX) 1 MG tablet Take 1 tablet (1 mg total) by mouth daily. 90 tablet 3  . aspirin 81 MG tablet Take 81 mg by mouth daily.    Marland Kitchen atorvastatin (LIPITOR) 40 MG tablet Take 40 mg by mouth at bedtime.    . Cyanocobalamin (VITAMIN B-12 PO) Take 1 tablet by mouth daily.    . diazepam (VALIUM) 5 MG tablet Take 5 mg by mouth  at bedtime  3  . escitalopram (LEXAPRO) 20 MG tablet Take 20 mg by mouth daily.     . Multiple Vitamin (MULTIVITAMIN) tablet Take 1 tablet by mouth daily.    . pantoprazole (PROTONIX) 40 MG tablet Take 1 tablet by mouth 2 (two) times daily.     No current facility-administered medications for this visit.     OBJECTIVE: Middle-aged white woman Who appears older than stated age  30:   02/25/17 1441  BP: (!) 129/57  Pulse: 66  Resp: 18  Temp: 98.4 F (36.9 C)  SpO2: 100%     Body mass index is 25.05 kg/m.    ECOG FS:1 - Symptomatic but completely ambulatory  Sclerae unicteric, EOMs intact Oropharynx clear and moist No cervical or supraclavicular adenopathy Lungs no rales or rhonchi Heart regular rate and rhythm Abd soft, nontender, positive bowel sounds MSK focal spinal tenderness at T2 and T9 on palpation, no  upper extremity lymphedema Neuro: nonfocal, well oriented, appropriate affect Breasts: The right breast is status post lumpectomy with no evidence of local recurrence. Left breast is unremarkable. Both axillae are benign.  LAB RESULTS:  CMP     Component Value Date/Time   NA 141 02/25/2017 1415   K 3.9 02/25/2017 1415   CL 107 01/05/2017 0556   CO2 26 02/25/2017 1415   GLUCOSE 110 02/25/2017 1415   BUN 14.1 02/25/2017 1415   CREATININE 1.0 02/25/2017 1415   CALCIUM 9.2 02/25/2017 1415   PROT 6.5 02/25/2017 1415   ALBUMIN 3.5 02/25/2017 1415   AST 16 02/25/2017 1415   ALT 10 02/25/2017 1415   ALKPHOS 74 02/25/2017 1415   BILITOT 0.56 02/25/2017 1415   GFRNONAA 55 (L) 01/05/2017 0556   GFRNONAA 42 (L) 11/08/2016 1146   GFRAA >60 01/05/2017 0556   GFRAA 49 (L) 11/08/2016 1146    I No results found for: SPEP  Lab Results  Component Value Date   WBC 7.6 01/05/2017   NEUTROABS 4,560 11/08/2016   HGB 11.6 (L) 01/05/2017   HCT 35.3 (L) 01/05/2017   MCV 94.4 01/05/2017   PLT 195 01/05/2017      Chemistry      Component Value Date/Time   NA 141 02/25/2017 1415   K 3.9 02/25/2017 1415   CL 107 01/05/2017 0556   CO2 26 02/25/2017 1415   BUN 14.1 02/25/2017 1415   CREATININE 1.0 02/25/2017 1415      Component Value Date/Time   CALCIUM 9.2 02/25/2017 1415   ALKPHOS 74 02/25/2017 1415   AST 16 02/25/2017 1415   ALT 10 02/25/2017 1415   BILITOT 0.56 02/25/2017 1415       No results found for: LABCA2  No components found for: LABCA125  No results for input(s): INR in the last 168 hours.  Urinalysis    Component Value Date/Time   COLORURINE AMBER (A) 07/06/2016 1607    STUDIES: Mm Diag Breast Tomo Bilateral  Result Date: 02/06/2017 CLINICAL DATA:  71 year old female presenting for routine annual surveillance status post right breast lumpectomy in 2015. EXAM: 2D DIGITAL DIAGNOSTIC BILATERAL MAMMOGRAM WITH CAD AND ADJUNCT TOMO COMPARISON:  Previous exam(s).  ACR Breast Density Category b: There are scattered areas of fibroglandular density. FINDINGS: The right breast lumpectomy site is stable. No suspicious calcifications, masses or areas of distortion are seen in the bilateral breasts. Mammographic images were processed with CAD. IMPRESSION: Stable right breast lumpectomy site. No mammographic evidence of malignancy in the bilateral breasts. RECOMMENDATION: Diagnostic mammogram is  suggested in 1 year. (Code:DM-B-01Y) I have discussed the findings and recommendations with the patient. Results were also provided in writing at the conclusion of the visit. If applicable, a reminder letter will be sent to the patient regarding the next appointment. BI-RADS CATEGORY  2: Benign. Electronically Signed   By: Ammie Ferrier M.D.   On: 02/06/2017 09:20   SPECT study 01/05/2017 shows an ejection fraction of 83%, negative for ischemia: Low risk scan   ASSESSMENT: 71 y.o. [place] woman status post right upper outer quadrant lumpectomy and axillary lymph node dissection 02/19/2014 for a pT1c pN0, stage IA invasive ductal carcinoma, grade 1, estrogen and progesterone receptor positive, HER-2 not amplified, with an MIB-1 of 12%  (1) Oncotype score of 18 predicts a risk of outside the breast recurrence of 11% if the patient's only systemic treatment is tamoxifen for 5 years. It also suggests no benefit from chemotherapy  (2) the patient opted against adjuvant radiation  (3) anastrozole started 04/18/2014  (a) DEXA scan at the [facility] August 20 03/01/2015 showed a T score of -3.2 (osteoporosis)  PLAN: Brigida is now 3 years out from definitive surgery for her breast cancer with no evidence of disease recurrence. This is very favorable.  She continues on anastrozole, with good tolerance. The plan is to continue that for a total of 5 years.  She will benefit from a repeat bone density which we will obtain with her next mammogram at the [facility]  The  burning sensation that she has when she wakes up in the morning reaching around both sides of her upper chest is going to be neuropathic, likely from a spinal source. She does have tenderness around the T9 area. She had a CT scan of the chest obtained in June and a compression fracture was not mentioned but these feelings that she has been having areonly a month old, so they would have started after that CT scan.  I offered to get plain films of her spine today but she declines. She understands she may need orthopedic or neurosurgical evaluation likely an MRI of the thoracic spine. She tells me she is going to be calling her husband orthopedist and follow up that way.  From a breast cancer point of view she is doing well. She will see me again in one year. She knows to call for any other problems that may develop before that     Natika Geyer, Virgie Dad, MD  02/25/17 3:00 PM Medical Oncology and Hematology [facility] [place], [place] [address] Tel. [phone]    Fax. [phone]   This document serves as a record of services personally performed by Lurline Del, MD. It was created on her behalf by Steva Colder, a trained medical scribe. The creation of this record is based on the scribe's personal observations and the provider's statements to them. This document has been checked and approved by the attending provider.

## 2017-02-27 DIAGNOSIS — E782 Mixed hyperlipidemia: Secondary | ICD-10-CM | POA: Diagnosis not present

## 2017-02-27 LAB — LIPID PANEL
Chol/HDL Ratio: 2.1 ratio (ref 0.0–4.4)
Cholesterol, Total: 124 mg/dL (ref 100–199)
HDL: 58 mg/dL (ref >39)
LDL Calculated: 49 mg/dL (ref 0–99)
Triglycerides: 84 mg/dL (ref 0–149)
VLDL Cholesterol Cal: 17 mg/dL (ref 5–40)

## 2017-02-27 LAB — HEPATIC FUNCTION PANEL
ALT: 9 IU/L (ref 0–32)
AST: 16 IU/L (ref 0–40)
Albumin: 3.9 g/dL (ref 3.5–4.8)
Alkaline Phosphatase: 76 IU/L (ref 39–117)
Bilirubin Total: 0.6 mg/dL (ref 0.0–1.2)
Bilirubin, Direct: 0.22 mg/dL (ref 0.00–0.40)
Total Protein: 6.2 g/dL (ref 6.0–8.5)

## 2017-02-28 ENCOUNTER — Telehealth: Payer: Self-pay | Admitting: Cardiovascular Disease

## 2017-02-28 NOTE — Telephone Encounter (Signed)
Returned call about results, understanding verbalized.

## 2017-02-28 NOTE — Telephone Encounter (Signed)
New message   Pt is returning a call - no message left per pt.

## 2017-03-12 ENCOUNTER — Ambulatory Visit (INDEPENDENT_AMBULATORY_CARE_PROVIDER_SITE_OTHER): Payer: Medicare Other | Admitting: Emergency Medicine

## 2017-03-12 ENCOUNTER — Ambulatory Visit: Payer: Medicare Other | Admitting: Emergency Medicine

## 2017-03-12 ENCOUNTER — Encounter: Payer: Self-pay | Admitting: Emergency Medicine

## 2017-03-12 DIAGNOSIS — C50411 Malignant neoplasm of upper-outer quadrant of right female breast: Secondary | ICD-10-CM | POA: Diagnosis not present

## 2017-03-12 DIAGNOSIS — R634 Abnormal weight loss: Secondary | ICD-10-CM | POA: Diagnosis not present

## 2017-03-12 DIAGNOSIS — J449 Chronic obstructive pulmonary disease, unspecified: Secondary | ICD-10-CM

## 2017-03-12 DIAGNOSIS — K219 Gastro-esophageal reflux disease without esophagitis: Secondary | ICD-10-CM

## 2017-03-12 DIAGNOSIS — I208 Other forms of angina pectoris: Secondary | ICD-10-CM

## 2017-03-12 DIAGNOSIS — J301 Allergic rhinitis due to pollen: Secondary | ICD-10-CM | POA: Diagnosis not present

## 2017-03-12 DIAGNOSIS — Z170 Estrogen receptor positive status [ER+]: Secondary | ICD-10-CM | POA: Diagnosis not present

## 2017-03-12 DIAGNOSIS — Z23 Encounter for immunization: Secondary | ICD-10-CM

## 2017-03-12 NOTE — Assessment & Plan Note (Signed)
Symptoms beginning to flare over the last few weeks. She needs to be back on allergy medication. She will restart her Allegra

## 2017-03-12 NOTE — Assessment & Plan Note (Signed)
Followed by Dr. Griffith Citron.

## 2017-03-12 NOTE — Assessment & Plan Note (Signed)
Appears to be stable. No wheezing on exam today. Continue Spiriva as ordered. Flu shot today.

## 2017-03-12 NOTE — Assessment & Plan Note (Signed)
Appears to be unintentional, unexplained. She does have a remote history of breast cancer and follows with Dr Griffith Citron. Given her low energy poor appetite, dry mouth and eyes she likely needs a metabolic workup, anemia workup, thyroid workup. She wants to follow up with Dr Dennard Schaumann to trouble shoot.

## 2017-03-12 NOTE — Assessment & Plan Note (Signed)
Contributor to her throat irritation and occasional cough. She is on pantoprazole twice a day. We'll continue this. She has seen ENT.

## 2017-03-12 NOTE — Patient Instructions (Signed)
Start taking your allegra every day during the Fall months Continue your protonix as you are taking  Continue Spiriva 2 sprays daily, rinse and gargle after using.  Please set an appointment with Dr Dennard Schaumann to discuss your weight loss, poor appetite.  Follow with Dr Griffith Citron annually as planned.   Flu shot today Follow with Dr Lamonte Sakai in 12 months or sooner if you have any problems

## 2017-03-12 NOTE — Progress Notes (Signed)
Subjective:    Patient ID: Brittany Kirk, female    DOB: 08/07/1945, 71 y.o.   MRN: [id number]  HPI 71 year old former smoker with a history of COPD, HTN, coronary artery disease, allergic rhinitis, hiatal hernia with GERD, breast CA s/p R lumpectomy + anastrozole. Has been followed by Brittany Brittany Kirk before for her COPD. Also had some stable pulm nodules that were followed by CT chest, last was reassuring in 11/14/10.   ROV 07/12/16 -- This follow-up visit for chronic cough setting of known COPD, allergic rhinitis, hiatal hernia with GERD. She was treated for an acute bronchitis following what sounded like a viral upper respiratory infection 06/28/16. She was given for mycin and prednisone. She then returned to the emergency department 07/06/16 with dizziness, weakness. There was no clear etiology. She's been referred to Brittany Kirk neurology. We did PFT today that I have reviewed >> spirometry shows mixed obstruction and restriction without a bronchodilator response, total lung capacity is restricted, the diffusion capacity is decreased but corrects to the normal range when adjusted for alveolar volume. FEV1 1.21 L (63% predicted). She is feeling better now, but still coughing, has some yellow mucous. She is on nexium qd, can sometimes have breakthru sx. She is not on any allergy meds currently. She uses albuterol occasionally, seems to help her breathing.   ROV 08/10/16 -- This follow-up visit for chronic cough as well as COPD, allergic rhinitis, GERD with a hiatal hernia. Her spirometry confirmed mixed obstruction and restriction and we started Spiriva Respimat at her last visit. Given her continued cough we also restarted loratadine 10 mg, treated her with doxycycline in the event that there was residual purulent bronchitis. She was continued on Nexium once a day. She feels that she has benefited from the spiriva, less exertional SOB w chores, stairs. She has albuterol but has not needed.   ROV 03/12/17 -- Patient  has a history of COPD, chronic cough in the setting of GERD with a hiatal hernia plus allergic rhinitis. Her spirometry confirms mixed obstruction and restriction. Also with a history of remote stable pulmonary nodules. She reports today that she is losing wt over the last 2 years, unintentional. She reports poor appetite.  Poor energy, dry mouth. She uses allegra prn.  She has a dry throat, nasal gtt. She remains on Spiriva respimat, does not use SABA. Good exercise tolerance   Review of Systems  Constitutional: Negative for fever and unexpected weight change.  HENT: Positive for congestion and sneezing. Negative for dental problem, ear pain, nosebleeds, postnasal drip, rhinorrhea, sinus pressure, sore throat and trouble swallowing.   Eyes: Negative for redness and itching.  Respiratory: Positive for cough and shortness of breath. Negative for chest tightness and wheezing.   Cardiovascular: Negative for palpitations and leg swelling.  Gastrointestinal: Negative for nausea and vomiting.  Genitourinary: Negative for dysuria.  Musculoskeletal: Negative for joint swelling.  Skin: Negative for rash.  Neurological: Negative for headaches.  Hematological: Does not bruise/bleed easily.  Psychiatric/Behavioral: Negative for dysphoric mood. The patient is nervous/anxious.        Objective:   Physical Exam Vitals:   03/12/17 1005  BP: 104/68  Pulse: 65  SpO2: 96%  Weight: 130 lb (59 kg)  Height: 5\' 1"  (1.549 m)   Gen: Pleasant, well-nourished, in no distress,  normal affect  ENT: No lesions,  mouth clear,  oropharynx clear, no postnasal drip  Neck: No JVD, no TMG, no carotid bruits  Lungs: No use of accessory muscles,  clear without rales or rhonchi  Cardiovascular: RRR, heart sounds normal, no murmur or gallops, no peripheral edema  Musculoskeletal: No deformities, no cyanosis or clubbing  Neuro: alert, non focal  Skin: Warm, no lesions or rashes      Assessment & Plan:  GERD  (gastroesophageal reflux disease) Contributor to her throat irritation and occasional cough. She is on pantoprazole twice a day. We'll continue this. She has seen ENT.  Rhinitis Symptoms beginning to flare over the last few weeks. She needs to be back on allergy medication. She will restart her Allegra  Malignant neoplasm of upper-outer quadrant of right breast in female, estrogen receptor positive ([name]) Followed by Brittany. Griffith Kirk.   Weight loss, unintentional Appears to be unintentional, unexplained. She does have a remote history of breast cancer and follows with Brittany Brittany Kirk. Given her low energy poor appetite, dry mouth and eyes she likely needs a metabolic workup, anemia workup, thyroid workup. She wants to follow up with Brittany Kirk to trouble shoot.   COPD (chronic obstructive pulmonary disease) (HCC) Appears to be stable. No wheezing on exam today. Continue Spiriva as ordered. Flu shot today.   Brittany Apo, MD, PhD 03/12/2017, 10:28 AM [facility] [phone] or if no answer [phone] .Marland Kitchen/.

## 2017-03-15 ENCOUNTER — Ambulatory Visit (INDEPENDENT_AMBULATORY_CARE_PROVIDER_SITE_OTHER): Payer: Medicare Other | Admitting: Family Medicine

## 2017-03-15 ENCOUNTER — Encounter: Payer: Self-pay | Admitting: Family Medicine

## 2017-03-15 VITALS — BP 142/72 | HR 69 | Temp 98.2°F | Resp 16 | Ht 61.0 in | Wt 130.0 lb

## 2017-03-15 DIAGNOSIS — I208 Other forms of angina pectoris: Secondary | ICD-10-CM

## 2017-03-15 DIAGNOSIS — R634 Abnormal weight loss: Secondary | ICD-10-CM

## 2017-03-15 DIAGNOSIS — J449 Chronic obstructive pulmonary disease, unspecified: Secondary | ICD-10-CM | POA: Diagnosis not present

## 2017-03-15 DIAGNOSIS — F321 Major depressive disorder, single episode, moderate: Secondary | ICD-10-CM | POA: Diagnosis not present

## 2017-03-15 DIAGNOSIS — R5382 Chronic fatigue, unspecified: Secondary | ICD-10-CM

## 2017-03-15 DIAGNOSIS — I10 Essential (primary) hypertension: Secondary | ICD-10-CM | POA: Diagnosis not present

## 2017-03-15 DIAGNOSIS — N183 Chronic kidney disease, stage 3 (moderate): Secondary | ICD-10-CM | POA: Diagnosis not present

## 2017-03-15 NOTE — Progress Notes (Signed)
Subjective:    Patient ID: Brittany Kirk, female    DOB: [birth year], 71 y.o.   MRN: [id number]  HPI  11/08/16 Patient presents today complaining chronic daily nausea for 2 months.  She states that she becomes extremely nauseated whenever she eats. Symptoms began roughly 2 months ago after a melanotic stool. This occurred one time and then resolve spontaneously however the nausea has persisted. Compared to her weight in October, she has lost 9 pounds. Patient states that she's not able to eat any solid food. She has to force herself to eat. However she is not vomiting. She denies any diarrhea. She denies any constipation. She denies any change in her bowel movements. She denies any fevers or chills. She has a history of cholecystectomy. She saw her gastroenterologist who reportedly told the patient that it was not due to an abnormality in her gastrointestinal tract. Patient states that labs were drawn but no other tests were performed. She states that the nausea is getting worse. She is on Lexapro 20 mg a day for the last year secondary to depression. She began the medication after her husband had an affair. However the relationship is stable now. She does not think that anxiety or depression is causing her nausea. She has not had any recent change in her medications. Colonoscopy in 2014 was normal. Patient had a CAT scan of the abdomen and pelvis that showed gallstones in 2014 but no other abnormalities. I do not see a recent EGD. She is tried over-the-counter acid reflux medication without benefit. Her gastroenterologist tried her on Zofran without benefit.  At that time, my plan was:  Differential diagnosis is large. Begin by obtaining a CBC to evaluate for leukocytosis, a CMP to evaluate for any electrolyte problems, sedimentation rate to look for significant inflammation somewhere in the body, a lipase to evaluate for pancreatitis, and a gastric emptying study to evaluate for gastroparesis. Given her  fatigue I will also check a TSH. If labs are normal, and gastric emptying study is normal, and nausea persist, consider a CAT scan to evaluate for abdominal malignancy. If CAT scan is normal, consider psychogenic causes of nausea such as depression or anxiety. It is also possible that Lexapro could be causing this so I asked her to wean down the Lexapro to 10 mg a day.  11/23/16 Gastric emptying study was normal and revealed no evidence of gastroparesis. Patient was started empirically on Reglan 5 mg 3 times a day. She thinks this may be helping slightly but she continues to lose weight and not eat. Her weight is down 1 pound since her last office visit. She denies abdominal pain. She denies any vomiting. She denies any diarrhea. She denies any constipation. Her biggest complaint is that she becomes sick to her stomach every time she tries to eat. Weaning down on the Lexapro may have helped some but is minimal at the present time.  At that time, my plan was: I will continue to wean the patient down Lexapro given the fact this can cause stomach upset. Decrease to 5 mg a day for 2 weeks and then discontinue the medication altogether. Continue the Reglan at the present time since this may have helped slightly. Proceed with a CT scan of the abdomen and pelvis to rule out an occult malignancy as a cause of her nausea and weight loss. Discontinue losartan due to her hypotension and increase her intake of fluids such as Gatorade.  03/15/17 In the interval time, CT  scan was normal. Patient has seen ENT for laryngoscopy which revealed only GERD. She is seeing pulmonary. She has had a variety of symptoms including hoarseness, laryngitis, throat irritation, cough. However she continues to report lack of appetite. She has to force herself to eat. Since I last saw her, she has lost 16 pounds. She continues to have nausea. She reports feeling weak and chronically fatigued. She is having trouble sleeping. She feels extremely  jittery and anxious. She denies any fevers or chills. She denies any night sweats. She denies any chest pain or shortness of breath. She denies any abdominal pain. She denies any diarrhea or constipation. Any melena or hematochezia. She denies any vaginal bleeding. She denies any hematuria. She denies any rashes or joint pain. She does report anxiety and depression unrelieved by Lexapro. Patient resumed her Lexapro independently one month ago due to feeling anxious Past Medical History:  Diagnosis Date  . Allergic rhinitis   . Asymptomatic bilateral carotid artery stenosis   . CAD (coronary artery disease)   . Cancer ([place])   . Complication of anesthesia    sore throat after a surgery  . Depression   . Emphysema    no longer uses inhalers  . Gallstones    nausea, pain upper right abdomen  . GERD (gastroesophageal reflux disease)   . H/O hiatal hernia   . History of skin cancer   . Hyperlipidemia   . Hypertension   . Shortness of breath    sob if walks up flight of stairs  . Tobacco user   . Wears dentures    top   Past Surgical History:  Procedure Laterality Date  . BREAST LUMPECTOMY Right 2015  . CHOLECYSTECTOMY  07/08/2012   Procedure: LAPAROSCOPIC CHOLECYSTECTOMY WITH INTRAOPERATIVE CHOLANGIOGRAM;  Surgeon: Gayland Curry, MD,FACS;  Location: [facility];  Service: General;  Laterality: N/A;  Laparoscopic Cholecystectomy with Intraoperative Cholangiogram  . COLONOSCOPY    . HAND SURGERY Left    wrist  . TONSILECTOMY, ADENOIDECTOMY, BILATERAL MYRINGOTOMY AND TUBES    . TUBAL LIGATION     Current Outpatient Prescriptions on File Prior to Visit  Medication Sig Dispense Refill  . anastrozole (ARIMIDEX) 1 MG tablet Take 1 tablet (1 mg total) by mouth daily. 90 tablet 3  . aspirin 81 MG tablet Take 81 mg by mouth daily.    Marland Kitchen atorvastatin (LIPITOR) 40 MG tablet Take 40 mg by mouth at bedtime.    . Cyanocobalamin (VITAMIN B-12 PO) Take 1 tablet by mouth daily.    . diazepam (VALIUM) 5 MG  tablet Take 5 mg by mouth at bedtime  3  . escitalopram (LEXAPRO) 20 MG tablet Take 20 mg by mouth daily.     . Multiple Vitamin (MULTIVITAMIN) tablet Take 1 tablet by mouth daily.    . pantoprazole (PROTONIX) 40 MG tablet Take 1 tablet by mouth 2 (two) times daily.    . Tiotropium Bromide Monohydrate (SPIRIVA RESPIMAT) 2.5 MCG/ACT AERS Inhale 2 puffs into the lungs daily.     No current facility-administered medications on file prior to visit.    Allergies  Allergen Reactions  . Penicillins Anaphylaxis, Itching, Swelling and Rash    Has patient had a PCN reaction causing immediate rash, facial/tongue/throat swelling, SOB or lightheadedness with hypotension: Yes Has patient had a PCN reaction causing severe rash involving mucus membranes or skin necrosis: No Has patient had a PCN reaction that required hospitalization: Unk Has patient had a PCN reaction occurring within the last  10 years: No If all of the above answers are "NO", then may proceed with Cephalosporin use.   . Cefaclor Itching, Swelling and Rash  . Latex Itching and Rash  . Metronidazole Itching, Swelling and Rash  . Naproxen Itching, Swelling and Rash  . Nsaids Itching, Swelling and Rash    Ibuprofen IS tolerated  . Septra [Bactrim] Itching, Swelling and Rash  . Sulfonamide Derivatives Itching, Swelling and Rash  . Tape Itching and Rash   Social History   Social History  . Marital status: Married    Spouse name: N/A  . Number of children: 1  . Years of education: N/A   Occupational History  . 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  Retired   Social History Main Topics  . Smoking status: Former Smoker    Packs/day: 0.50    Years: 40.00    Types: Cigarettes    Quit date: 02/13/2011  . Smokeless tobacco: Never Used  . Alcohol use No  . Drug use: No  . Sexual activity: Not on file   Other Topics Concern  . Not on file   Social History Narrative  . No narrative on file      Review of Systems  All other  systems reviewed and are negative.      Objective:   Physical Exam  Cardiovascular: Normal rate, regular rhythm and normal heart sounds.   Pulmonary/Chest: Effort normal and breath sounds normal. No respiratory distress. She has no wheezes. She has no rales. She exhibits no tenderness.  Abdominal: Soft. Bowel sounds are normal. She exhibits no distension, no fluid wave, no ascites and no mass. There is no tenderness. There is no rebound and no CVA tenderness. No hernia.  Musculoskeletal: She exhibits edema.  Vitals reviewed.         Assessment & Plan:  Weight loss, unintentional - Plan: TSH, CBC with Differential/Platelet, COMPLETE METABOLIC PANEL WITH GFR, Sedimentation rate, Vitamin B12, Ambulatory referral to Gastroenterology  Chronic fatigue - Plan: TSH, CBC with Differential/Platelet, COMPLETE METABOLIC PANEL WITH GFR, Sedimentation rate, Vitamin B12  Depression, major, single episode, moderate (HCC) Patient has lost 16 pounds. I'll check lab work in May and there was a borderline low TSH. I will repeat that to ensure that hyperthyroidism is not the cause. I will also consult GI to consider EGD and colonoscopy to rule out an occult malignancy given the fact CT scan was normal. I will check sedimentation rate to evaluate for autoimmune diseases, vitamin B12 levels, TSH, CBC, CMP. I explained this all with the patient. However I believe the majority of this is anxiety and stress related. I believe that her weight loss stems from her lack of appetite which stems from uncontrolled depression and anxiety. I recommended discontinuing Lexapro and switching to Trintellix 5 mg a day.  In 1 week I would increase to 10 mg a day and then recheck in 4 weeks. Consider adding Remeron at night as an appetite stimulant

## 2017-03-16 LAB — COMPLETE METABOLIC PANEL WITH GFR
AG Ratio: 1.5 (calc) (ref 1.0–2.5)
ALT: 10 U/L (ref 6–29)
AST: 15 U/L (ref 10–35)
Albumin: 3.7 g/dL (ref 3.6–5.1)
Alkaline phosphatase (APISO): 69 U/L (ref 33–130)
BUN/Creatinine Ratio: 17 (calc) (ref 6–22)
BUN: 17 mg/dL (ref 7–25)
CO2: 24 mmol/L (ref 20–32)
Calcium: 9.2 mg/dL (ref 8.6–10.4)
Chloride: 102 mmol/L (ref 98–110)
Creat: 0.99 mg/dL — ABNORMAL HIGH (ref 0.60–0.93)
GFR, Est African American: 66 mL/min/{1.73_m2} (ref >=60)
GFR, Est Non African American: 57 mL/min/{1.73_m2} — ABNORMAL LOW (ref >=60)
Globulin: 2.4 g/dL (calc) (ref 1.9–3.7)
Glucose, Bld: 94 mg/dL (ref 65–99)
Potassium: 4.1 mmol/L (ref 3.5–5.3)
Sodium: 139 mmol/L (ref 135–146)
Total Bilirubin: 0.6 mg/dL (ref 0.2–1.2)
Total Protein: 6.1 g/dL (ref 6.1–8.1)

## 2017-03-16 LAB — CBC WITH DIFFERENTIAL/PLATELET
Basophils Absolute: 27 cells/uL (ref 0–200)
Basophils Relative: 0.3 %
Eosinophils Absolute: 9 cells/uL — ABNORMAL LOW (ref 15–500)
Eosinophils Relative: 0.1 %
HCT: 39.2 % (ref 35.0–45.0)
Hemoglobin: 13.6 g/dL (ref 11.7–15.5)
Lymphs Abs: 2178 cells/uL (ref 850–3900)
MCH: 32.7 pg (ref 27.0–33.0)
MCHC: 34.7 g/dL (ref 32.0–36.0)
MCV: 94.2 fL (ref 80.0–100.0)
MPV: 11.1 fL (ref 7.5–12.5)
Monocytes Relative: 5.4 %
Neutro Abs: 6300 cells/uL (ref 1500–7800)
Neutrophils Relative %: 70 %
Platelets: 240 10*3/uL (ref 140–400)
RBC: 4.16 10*6/uL (ref 3.80–5.10)
RDW: 13.4 % (ref 11.0–15.0)
Total Lymphocyte: 24.2 %
WBC mixed population: 486 cells/uL (ref 200–950)
WBC: 9 10*3/uL (ref 3.8–10.8)

## 2017-03-16 LAB — SEDIMENTATION RATE: Sed Rate: 6 mm/h (ref 0–30)

## 2017-03-16 LAB — TSH: TSH: 0.48 mIU/L (ref 0.40–4.50)

## 2017-03-16 LAB — VITAMIN B12: Vitamin B-12: 716 pg/mL (ref 200–1100)

## 2017-04-01 DIAGNOSIS — D485 Neoplasm of uncertain behavior of skin: Secondary | ICD-10-CM | POA: Diagnosis not present

## 2017-04-01 DIAGNOSIS — L570 Actinic keratosis: Secondary | ICD-10-CM | POA: Diagnosis not present

## 2017-04-01 DIAGNOSIS — I788 Other diseases of capillaries: Secondary | ICD-10-CM | POA: Diagnosis not present

## 2017-04-01 DIAGNOSIS — Z85828 Personal history of other malignant neoplasm of skin: Secondary | ICD-10-CM | POA: Diagnosis not present

## 2017-04-01 DIAGNOSIS — D0462 Carcinoma in situ of skin of left upper limb, including shoulder: Secondary | ICD-10-CM | POA: Diagnosis not present

## 2017-04-01 DIAGNOSIS — L821 Other seborrheic keratosis: Secondary | ICD-10-CM | POA: Diagnosis not present

## 2017-04-01 DIAGNOSIS — D692 Other nonthrombocytopenic purpura: Secondary | ICD-10-CM | POA: Diagnosis not present

## 2017-04-10 ENCOUNTER — Telehealth: Payer: Self-pay | Admitting: Family Medicine

## 2017-04-10 MED ORDER — VORTIOXETINE HBR 10 MG PO TABS: 10.0000 mg | ORAL_TABLET | Freq: Every day | ORAL | 3 refills | Status: DC

## 2017-04-10 NOTE — Telephone Encounter (Signed)
Medication called/sent to requested pharmacy  

## 2017-04-10 NOTE — Telephone Encounter (Signed)
Pt called about the trintellix sample, please call in script to [address]

## 2017-04-15 ENCOUNTER — Other Ambulatory Visit: Payer: Self-pay | Admitting: Gastroenterology

## 2017-04-15 DIAGNOSIS — R634 Abnormal weight loss: Secondary | ICD-10-CM

## 2017-04-15 DIAGNOSIS — R110 Nausea: Secondary | ICD-10-CM

## 2017-04-16 ENCOUNTER — Ambulatory Visit
Admission: RE | Admit: 2017-04-16 | Discharge: 2017-04-16 | Disposition: A | Discharge status: Home / Self Care | Payer: Medicare Other | Source: Ambulatory Visit | Attending: Gastroenterology | Admitting: Gastroenterology

## 2017-04-16 DIAGNOSIS — R112 Nausea with vomiting, unspecified: Secondary | ICD-10-CM | POA: Diagnosis not present

## 2017-04-16 DIAGNOSIS — R634 Abnormal weight loss: Secondary | ICD-10-CM | POA: Diagnosis not present

## 2017-04-16 DIAGNOSIS — R110 Nausea: Secondary | ICD-10-CM

## 2017-04-16 MED ORDER — IOPAMIDOL (ISOVUE-300) INJECTION 61%
100.0000 mL | Freq: Once | INTRAVENOUS | Status: AC | PRN
  Administered 2017-04-16: 100 mL via INTRAVENOUS

## 2017-05-07 ENCOUNTER — Other Ambulatory Visit: Payer: Self-pay | Admitting: Family Medicine

## 2017-05-07 MED ORDER — ATORVASTATIN CALCIUM 40 MG PO TABS: 40.0000 mg | ORAL_TABLET | Freq: Every day | ORAL | 1 refills | Status: DC

## 2017-05-08 DIAGNOSIS — T5494XA Toxic effect of unspecified corrosive substance, undetermined, initial encounter: Secondary | ICD-10-CM | POA: Diagnosis not present

## 2017-05-08 DIAGNOSIS — R112 Nausea with vomiting, unspecified: Secondary | ICD-10-CM | POA: Diagnosis not present

## 2017-05-08 DIAGNOSIS — T287XXA Corrosion of other parts of alimentary tract, initial encounter: Secondary | ICD-10-CM | POA: Diagnosis not present

## 2017-05-08 DIAGNOSIS — R634 Abnormal weight loss: Secondary | ICD-10-CM | POA: Diagnosis not present

## 2017-05-08 DIAGNOSIS — R630 Anorexia: Secondary | ICD-10-CM | POA: Diagnosis not present

## 2017-05-10 ENCOUNTER — Other Ambulatory Visit: Payer: Self-pay | Admitting: Gastroenterology

## 2017-05-10 DIAGNOSIS — R1310 Dysphagia, unspecified: Secondary | ICD-10-CM

## 2017-05-14 ENCOUNTER — Ambulatory Visit
Admission: RE | Admit: 2017-05-14 | Discharge: 2017-05-14 | Disposition: A | Discharge status: Home / Self Care | Payer: Medicare Other | Source: Ambulatory Visit | Attending: Gastroenterology | Admitting: Gastroenterology

## 2017-05-14 DIAGNOSIS — R1310 Dysphagia, unspecified: Secondary | ICD-10-CM

## 2017-05-23 ENCOUNTER — Other Ambulatory Visit: Payer: Self-pay | Admitting: Gastroenterology

## 2017-05-24 ENCOUNTER — Other Ambulatory Visit: Payer: Self-pay | Admitting: Gastroenterology

## 2017-05-28 ENCOUNTER — Encounter (HOSPITAL_COMMUNITY): Payer: Self-pay | Admitting: *Deleted

## 2017-05-28 ENCOUNTER — Other Ambulatory Visit: Payer: Self-pay

## 2017-05-28 NOTE — Progress Notes (Signed)
Pt denies any acute cardiopulmonary issues. Pt under the care of Dr. Gwenlyn Found, Cardiology. Pt stated that she has not taken Aspirin in a few weeks. Pt made aware to stop taking vitamins, fish oil and herbal medications. Do not take any NSAIDs ie: Ibuprofen, Advil, Naproxen (Aleve), Motrin, BC and Goody Powder. Pt verbalized understanding of all pre-op instructions.

## 2017-05-28 NOTE — Anesthesia Preprocedure Evaluation (Signed)
Anesthesia Evaluation  Patient identified by MRN, date of birth, ID band Patient awake    Reviewed: Allergy & Precautions, H&P , Patient's Chart, lab work & pertinent test results, reviewed documented beta blocker date and time   History of Anesthesia Complications (+) history of anesthetic complications  Airway Mallampati: II  TM Distance: >3 FB Neck ROM: full    Dental no notable dental hx.    Pulmonary former smoker,    Pulmonary exam normal breath sounds clear to auscultation       Cardiovascular hypertension, + CAD   Rhythm:regular Rate:Normal     Neuro/Psych    GI/Hepatic   Endo/Other    Renal/GU CRF     Musculoskeletal   Abdominal   Peds  Hematology   Anesthesia Other Findings   Reproductive/Obstetrics                             Anesthesia Physical Anesthesia Plan  ASA: III  Anesthesia Plan: MAC   Post-op Pain Management:    Induction: Intravenous  PONV Risk Score and Plan:   Airway Management Planned: Mask and Natural Airway  Additional Equipment:   Intra-op Plan:   Post-operative Plan:   Informed Consent: I have reviewed the patients History and Physical, chart, labs and discussed the procedure including the risks, benefits and alternatives for the proposed anesthesia with the patient or authorized representative who has indicated his/her understanding and acceptance.   Dental Advisory Given  Plan Discussed with: CRNA and Surgeon  Anesthesia Plan Comments:         Anesthesia Quick Evaluation

## 2017-05-29 ENCOUNTER — Ambulatory Visit (HOSPITAL_COMMUNITY): Payer: Medicare Other | Admitting: Anesthesiology

## 2017-05-29 ENCOUNTER — Ambulatory Visit (HOSPITAL_COMMUNITY)
Admission: RE | Admit: 2017-05-29 | Discharge: 2017-05-29 | Disposition: A | Discharge status: Home / Self Care | Payer: Medicare Other | Source: Ambulatory Visit | Attending: Gastroenterology | Admitting: Gastroenterology

## 2017-05-29 ENCOUNTER — Other Ambulatory Visit: Payer: Self-pay

## 2017-05-29 ENCOUNTER — Encounter (HOSPITAL_COMMUNITY)
Admission: RE | Disposition: A | Discharge status: Home / Self Care | Payer: Self-pay | Source: Ambulatory Visit | Attending: Gastroenterology

## 2017-05-29 ENCOUNTER — Encounter (HOSPITAL_COMMUNITY): Payer: Self-pay | Admitting: *Deleted

## 2017-05-29 DIAGNOSIS — K449 Diaphragmatic hernia without obstruction or gangrene: Secondary | ICD-10-CM | POA: Insufficient documentation

## 2017-05-29 DIAGNOSIS — K220 Achalasia of cardia: Secondary | ICD-10-CM | POA: Diagnosis not present

## 2017-05-29 DIAGNOSIS — Z79899 Other long term (current) drug therapy: Secondary | ICD-10-CM | POA: Insufficient documentation

## 2017-05-29 DIAGNOSIS — I10 Essential (primary) hypertension: Secondary | ICD-10-CM | POA: Diagnosis not present

## 2017-05-29 DIAGNOSIS — K224 Dyskinesia of esophagus: Secondary | ICD-10-CM | POA: Insufficient documentation

## 2017-05-29 DIAGNOSIS — F329 Major depressive disorder, single episode, unspecified: Secondary | ICD-10-CM | POA: Diagnosis not present

## 2017-05-29 DIAGNOSIS — K219 Gastro-esophageal reflux disease without esophagitis: Secondary | ICD-10-CM | POA: Diagnosis not present

## 2017-05-29 DIAGNOSIS — R1310 Dysphagia, unspecified: Secondary | ICD-10-CM | POA: Diagnosis not present

## 2017-05-29 DIAGNOSIS — F419 Anxiety disorder, unspecified: Secondary | ICD-10-CM | POA: Diagnosis not present

## 2017-05-29 DIAGNOSIS — I2510 Atherosclerotic heart disease of native coronary artery without angina pectoris: Secondary | ICD-10-CM | POA: Diagnosis not present

## 2017-05-29 DIAGNOSIS — Z87891 Personal history of nicotine dependence: Secondary | ICD-10-CM | POA: Insufficient documentation

## 2017-05-29 HISTORY — PX: ESOPHAGOSCOPY: SHX5534

## 2017-05-29 HISTORY — DX: Anxiety disorder, unspecified: F41.9

## 2017-05-29 SURGERY — ESOPHAGOSCOPY
Anesthesia: Monitor Anesthesia Care

## 2017-05-29 MED ORDER — LACTATED RINGERS IV SOLN
INTRAVENOUS | Status: DC | PRN
  Administered 2017-05-29: 09:00:00 via INTRAVENOUS

## 2017-05-29 MED ORDER — LIDOCAINE HCL (CARDIAC) 20 MG/ML IV SOLN
INTRAVENOUS | Status: DC | PRN
  Administered 2017-05-29: 30 mg via INTRATRACHEAL

## 2017-05-29 MED ORDER — SODIUM CHLORIDE 0.9 % IV SOLN: INTRAVENOUS | Status: DC

## 2017-05-29 MED ORDER — SODIUM CHLORIDE 0.9 % IV SOLN
INTRAVENOUS | Status: DC
  Administered 2017-05-29: 09:00:00 via INTRAVENOUS

## 2017-05-29 MED ORDER — PROPOFOL 10 MG/ML IV BOLUS
INTRAVENOUS | Status: DC | PRN
  Administered 2017-05-29 (×3): 20 mg via INTRAVENOUS
  Administered 2017-05-29: 30 mg via INTRAVENOUS

## 2017-05-29 MED ORDER — ONABOTULINUMTOXINA 100 UNITS IJ SOLR
100.0000 [IU] | Freq: Once | INTRAMUSCULAR | Status: AC
  Administered 2017-05-29: 100 [IU] via INTRAMUSCULAR
  Filled 2017-05-29: qty 100

## 2017-05-29 NOTE — Discharge Instructions (Signed)
YOU HAD AN ENDOSCOPIC PROCEDURE TODAY: Refer to the procedure report and other information in the discharge instructions given to you for any specific questions about what was found during the examination. If this information does not answer your questions, please call [facility] office at [phone] to clarify.   YOU SHOULD EXPECT: Some feelings of bloating in the abdomen. Passage of more gas than usual. Walking can help get rid of the air that was put into your GI tract during the procedure and reduce the bloating. If you had a lower endoscopy (such as a colonoscopy or flexible sigmoidoscopy) you may notice spotting of blood in your stool or on the toilet paper. Some abdominal soreness may be present for a day or two, also.  DIET: Your first meal following the procedure should be a light meal and then it is ok to progress to your normal diet. A half-sandwich or bowl of soup is an example of a good first meal. Heavy or fried foods are harder to digest and may make you feel nauseous or bloated. Drink plenty of fluids but you should avoid alcoholic beverages for 24 hours. If you had a esophageal dilation, please see attached instructions for diet.   ACTIVITY: Your care partner should take you home directly after the procedure. You should plan to take it easy, moving slowly for the rest of the day. You can resume normal activity the day after the procedure however YOU SHOULD NOT DRIVE, use power tools, machinery or perform tasks that involve climbing or major physical exertion for 24 hours (because of the sedation medicines used during the test).   SYMPTOMS TO REPORT IMMEDIATELY: A gastroenterologist can be reached at any hour. Please call [phone]  for any of the following symptoms:  Following upper endoscopy (EGD, EUS, ERCP, esophageal dilation) Vomiting of blood or coffee ground material  New, significant abdominal pain  New, significant chest pain or pain under the shoulder blades  Painful or  persistently difficult swallowing  New shortness of breath  Black, tarry-looking or red, bloody stools  FOLLOW UP:  If any biopsies were taken you will be contacted by phone or by letter within the next 1-3 weeks. Call [phone]  if you have not heard about the biopsies in 3 weeks.  Please also call with any specific questions about appointments or follow up tests.   Call if question or problem or if no better in one to 2 weeks otherwise follow-up in 4 weeks and soft solid diet today

## 2017-05-29 NOTE — Transfer of Care (Signed)
Immediate Anesthesia Transfer of Care Note  Patient: Brittany Kirk  Procedure(s) Performed: ESOPHAGOSCOPY (N/A )  Patient Location: Endoscopy Unit  Anesthesia Type:MAC  Level of Consciousness: awake, alert , oriented and patient cooperative  Airway & Oxygen Therapy: Patient Spontanous Breathing and Patient connected to nasal cannula oxygen  Post-op Assessment: Report given to RN and Post -op Vital signs reviewed and stable  Post vital signs: Reviewed and stable  Last Vitals:  Vitals:   05/29/17 0808  BP: (!) 167/46  Pulse: 63  Resp: (!) 22  Temp: 36.6 C  SpO2: 100%    Last Pain:  Vitals:   05/29/17 0808  TempSrc: Oral         Complications: No apparent anesthesia complications

## 2017-05-29 NOTE — Op Note (Signed)
[facility] Patient Name: Brittany Kirk Procedure Date : 05/29/2017 MRN: [id number] Attending MD: Clarene Essex , MD Date of Birth: [birth year] CSN: [id number] Age: 71 Admit Type: Outpatient Procedure:                Upper GI endoscopy Indications:              Dysphagia questionable early achalasia versus                            increase esophageal spasm Providers:                Clarene Essex, MD, Cleda Daub, RN, William Dalton,                            Technician Referring MD:              Medicines:                Propofol total dose 100 mg IV,30 mg lidocaine IV Complications:            No immediate complications. Estimated Blood Loss:     Estimated blood loss: none. Procedure:                Pre-Anesthesia Assessment:                           - Prior to the procedure, a History and Physical                            was performed, and patient medications and                            allergies were reviewed. The patient's tolerance of                            previous anesthesia was also reviewed. The risks                            and benefits of the procedure and the sedation                            options and risks were discussed with the patient.                            All questions were answered, and informed consent                            was obtained. Prior Anticoagulants: The patient has                            taken no previous anticoagulant or antiplatelet                            agents. ASA Grade Assessment: II - A patient with  mild systemic disease. After reviewing the risks                            and benefits, the patient was deemed in                            satisfactory condition to undergo the procedure.                           After obtaining informed consent, the endoscope was                            passed under direct vision. Throughout the                            procedure,  the patient's blood pressure, pulse, and                            oxygen saturations were monitored continuously. The                            EG-[id number] ([id number]) scope was introduced through the                            mouth, and advanced to the second part of duodenum.                            The upper GI endoscopy was accomplished without                            difficulty. The patient tolerated the procedure                            well. Scope In: Scope Out: Findings:      Abnormal motility was noted in the lower third of the esophagus. There       is spasticity of the esophageal body. The distal esophagus/lower       esophageal sphincter is spastic, but gives up passage to the endoscope.       Area was successfully injected with 100 units botulinum toxin.      The entire examined stomach was normal.      A small hiatal hernia was present.      The duodenal bulb, first portion of the duodenum and second portion of       the duodenum were normal.      The exam was otherwise without abnormality. Impression:               - Abnormal esophageal motility, suspicious for                            achalasia versus increased esophageal spasm.                            Injected with botulinum toxin.                           -  Normal stomach.                           - Small hiatal hernia.                           - Normal duodenal bulb, first portion of the                            duodenum and second portion of the duodenum.                           - The examination was otherwise normal.                           - No specimens collected. Moderate Sedation:      moderate sedation-none Recommendation:           - Patient has a contact number available for                            emergencies. The signs and symptoms of potential                            delayed complications were discussed with the                            patient. Return to normal activities  tomorrow.                            Written discharge instructions were provided to the                            patient.                           - Soft diet today.                           - Continue present medications.                           - Return to GI clinic in 4 weeks.                           - Telephone GI clinic if symptomatic PRN. consider                            University referral versus manometry in the future                            if no improvement Procedure Code(s):        --- Professional ---                           [phone], Esophagogastroduodenoscopy, flexible,  transoral; with directed submucosal injection(s),                            any substance Diagnosis Code(s):        --- Professional ---                           K22.4, Dyskinesia of esophagus                           K44.9, Diaphragmatic hernia without obstruction or                            gangrene                           R13.10, Dysphagia, unspecified CPT copyright 2016 American Medical Association. All rights reserved. The codes documented in this report are preliminary and upon coder review may  be revised to meet current compliance requirements. Clarene Essex, MD 05/29/2017 9:40:50 AM This report has been signed electronically. Number of Addenda: 0

## 2017-05-29 NOTE — Progress Notes (Signed)
Brittany Kirk 8:37 AM  Subjective: Patient without any changes since we last recently saw her in the office  Objective: Vital signs stable afebrile no acute distress exam please see preassessment evaluation  Assessment: Swallowing disorder possible achalasia  Plan: We again discussed endoscopy and Botox versus putting her through manometry with her and her husband and we elected to proceed with anesthesia assistance with further workup and plans pending those findings  [facility] E  Pager [phone] After 5PM or if no answer call [phone]

## 2017-05-29 NOTE — Anesthesia Postprocedure Evaluation (Signed)
Anesthesia Post Note  Patient: Brittany Kirk  Procedure(s) Performed: ESOPHAGOSCOPY (N/A )     Patient location during evaluation: PACU Anesthesia Type: MAC Level of consciousness: awake and alert Pain management: pain level controlled Vital Signs Assessment: post-procedure vital signs reviewed and stable Respiratory status: spontaneous breathing, nonlabored ventilation, respiratory function stable and patient connected to nasal cannula oxygen Cardiovascular status: stable and blood pressure returned to baseline Postop Assessment: no apparent nausea or vomiting Anesthetic complications: no    Last Vitals:  Vitals:   05/29/17 1005 05/29/17 1010  BP:    Pulse:    Resp:    Temp:    SpO2: 99% 100%    Last Pain:  Vitals:   05/29/17 0944  TempSrc: Oral                 Kingstin Heims EDWARD

## 2017-05-30 ENCOUNTER — Encounter (HOSPITAL_COMMUNITY): Payer: Self-pay | Admitting: Gastroenterology

## 2017-07-08 DIAGNOSIS — R1084 Generalized abdominal pain: Secondary | ICD-10-CM | POA: Diagnosis not present

## 2017-07-08 DIAGNOSIS — R634 Abnormal weight loss: Secondary | ICD-10-CM | POA: Diagnosis not present

## 2017-07-08 DIAGNOSIS — K921 Melena: Secondary | ICD-10-CM | POA: Diagnosis not present

## 2017-07-08 DIAGNOSIS — K5901 Slow transit constipation: Secondary | ICD-10-CM | POA: Diagnosis not present

## 2017-07-08 DIAGNOSIS — R110 Nausea: Secondary | ICD-10-CM | POA: Diagnosis not present

## 2017-07-10 DIAGNOSIS — Z86010 Personal history of colonic polyps: Secondary | ICD-10-CM | POA: Diagnosis not present

## 2017-07-10 DIAGNOSIS — K635 Polyp of colon: Secondary | ICD-10-CM | POA: Diagnosis not present

## 2017-07-10 DIAGNOSIS — K5730 Diverticulosis of large intestine without perforation or abscess without bleeding: Secondary | ICD-10-CM | POA: Diagnosis not present

## 2017-07-11 ENCOUNTER — Other Ambulatory Visit: Payer: Self-pay | Admitting: Oncology

## 2017-07-15 ENCOUNTER — Telehealth: Payer: Self-pay

## 2017-07-15 ENCOUNTER — Other Ambulatory Visit: Payer: Self-pay | Admitting: Oncology

## 2017-07-15 DIAGNOSIS — Z170 Estrogen receptor positive status [ER+]: Principal | ICD-10-CM

## 2017-07-15 DIAGNOSIS — C50411 Malignant neoplasm of upper-outer quadrant of right female breast: Secondary | ICD-10-CM

## 2017-07-15 NOTE — Telephone Encounter (Signed)
Returned pt VM regarding symptoms she is experiencing. I spoke with Dr. Jana Hakim and per him she is to stop taking the anastrozole for 3 weeks and to see if these symptoms improve. Informed her to keep a symptom diary of her current symptoms and if they improve over the next three weeks and if she experiences any new symptoms. Scheduling message sent for pt to see Dr. Jana Hakim in 3 weeks.  Cyndia Bent RN

## 2017-07-16 ENCOUNTER — Telehealth: Payer: Self-pay | Admitting: Oncology

## 2017-07-16 DIAGNOSIS — K635 Polyp of colon: Secondary | ICD-10-CM | POA: Diagnosis not present

## 2017-07-16 NOTE — Telephone Encounter (Signed)
Scheduled appts per 2/4 sch msg - spoke with patient regarding appts that were added.

## 2017-08-06 ENCOUNTER — Ambulatory Visit: Payer: Medicare Other | Admitting: Oncology

## 2017-08-06 ENCOUNTER — Telehealth: Payer: Self-pay | Admitting: Oncology

## 2017-08-06 ENCOUNTER — Inpatient Hospital Stay: Payer: Medicare Other

## 2017-08-06 ENCOUNTER — Inpatient Hospital Stay: Payer: Medicare Other | Attending: Oncology | Admitting: Oncology

## 2017-08-06 VITALS — BP 148/63 | HR 63 | Temp 97.9°F | Resp 16 | Ht 61.0 in | Wt 118.1 lb

## 2017-08-06 DIAGNOSIS — Z170 Estrogen receptor positive status [ER+]: Secondary | ICD-10-CM | POA: Diagnosis not present

## 2017-08-06 DIAGNOSIS — M810 Age-related osteoporosis without current pathological fracture: Secondary | ICD-10-CM | POA: Insufficient documentation

## 2017-08-06 DIAGNOSIS — I779 Disorder of arteries and arterioles, unspecified: Secondary | ICD-10-CM

## 2017-08-06 DIAGNOSIS — C50411 Malignant neoplasm of upper-outer quadrant of right female breast: Secondary | ICD-10-CM

## 2017-08-06 DIAGNOSIS — I739 Peripheral vascular disease, unspecified: Secondary | ICD-10-CM

## 2017-08-06 DIAGNOSIS — Z79811 Long term (current) use of aromatase inhibitors: Secondary | ICD-10-CM | POA: Insufficient documentation

## 2017-08-06 LAB — COMPREHENSIVE METABOLIC PANEL
ALT: 12 U/L (ref 0–55)
AST: 15 U/L (ref 5–34)
Albumin: 3.8 g/dL (ref 3.5–5.0)
Alkaline Phosphatase: 71 U/L (ref 40–150)
Anion gap: 10 (ref 3–11)
BUN: 17 mg/dL (ref 7–26)
CO2: 27 mmol/L (ref 22–29)
Calcium: 9.7 mg/dL (ref 8.4–10.4)
Chloride: 105 mmol/L (ref 98–109)
Creatinine, Ser: 0.86 mg/dL (ref 0.60–1.10)
GFR calc Af Amer: >60 mL/min (ref >60)
GFR calc non Af Amer: >60 mL/min (ref >60)
Glucose, Bld: 99 mg/dL (ref 70–140)
Potassium: 4.1 mmol/L (ref 3.5–5.1)
Sodium: 142 mmol/L (ref 136–145)
Total Bilirubin: 0.7 mg/dL (ref 0.2–1.2)
Total Protein: 6.7 g/dL (ref 6.4–8.3)

## 2017-08-06 LAB — CBC WITH DIFFERENTIAL/PLATELET
Basophils Absolute: 0 10*3/uL (ref 0.0–0.1)
Basophils Relative: 0 %
Eosinophils Absolute: 0 10*3/uL (ref 0.0–0.5)
Eosinophils Relative: 0 %
HCT: 41.3 % (ref 34.8–46.6)
Hemoglobin: 13.4 g/dL (ref 11.6–15.9)
Lymphocytes Relative: 26 %
Lymphs Abs: 2.1 10*3/uL (ref 0.9–3.3)
MCH: 32.8 pg (ref 25.1–34.0)
MCHC: 32.4 g/dL (ref 31.5–36.0)
MCV: 101 fL (ref 79.5–101.0)
Monocytes Absolute: 0.5 10*3/uL (ref 0.1–0.9)
Monocytes Relative: 6 %
Neutro Abs: 5.4 10*3/uL (ref 1.5–6.5)
Neutrophils Relative %: 68 %
Platelets: 230 10*3/uL (ref 145–400)
RBC: 4.09 MIL/uL (ref 3.70–5.45)
RDW: 14.2 % (ref 11.2–14.5)
WBC: 7.9 10*3/uL (ref 3.9–10.3)

## 2017-08-06 LAB — CEA (IN HOUSE-CHCC): CEA (CHCC-In House): <1 ng/mL (ref 0.00–5.00)

## 2017-08-06 NOTE — Progress Notes (Signed)
[facility]  [phone] [phone]) [phone]     ID: Brittany Kirk DOB: 1945/09/16  MR#: [id number]  OHY#:[id number]  Patient Care Team: Susy Frizzle, MD as PCP - General (Family Medicine) Elsie Stain, MD (Pulmonary Disease) [place] Espina, Virgie Dad, MD as Consulting Physician (Oncology) Thea Silversmith, MD (Inactive) as Consulting Physician (Radiation Oncology) Dian Queen, MD as Consulting Physician (Obstetrics and Gynecology) OTHER MD: Griselda Miner M.D.  CHIEF COMPLAINT: Early stage estrogen receptor positive breast cancer  CURRENT TREATMENT: anastrozole   BREAST CANCER HISTORY: From the earlier intake note:  Makiah underwent routine screening mammography August of 2015 showing a possible mass in the right breast. On 01/19/2014 at the breast center she underwent right diagnostic mammography and ultrasonography. The breast density was category B. The mass appeared spiculated on spot compression views and an ultrasound it was nearly isoechoic. It was located at the 10:00 position 4 cm from the nipple and measured 7.5 mm by ultrasonography. Ultrasound of the right axilla was negative.  On 01/26/2014 the patient underwent biopsy of the mass in question and this showed ([id number]) and invasive ductal carcinoma, grade 1, estrogen receptor 100% positive, progesterone receptor 60% positive, both with strong staining intensity, with an MIB-1 of 12%, and no HER-2 modification, the signals ratio being 1.38 and the number per cell 3.30.  On 02/19/2014 the patient underwent right lumpectomy. Sentinel lymph node sampling was attempted but failed and accordingly the patient received a full axillary lymph node dissection. The final pathology from this procedure ([id number]) showed an invasive ductal carcinoma, 1.4 cm, grade 1, with negative margins, and all 19 lymph nodes clear.  Her subsequent history is as detailed below  INTERVAL HISTORY: Brittany Kirk  returns today for follow-up and treatment of her estrogen receptor positive breast cancer. She called 07/15/2017 regarding adverse symptoms including significant weight loss and nausea. She discontinued taking anastrozole for the past 3 weeks to see if her symptoms would improve. She reports no improvement in her symptoms.  She had a CT of the abdomen and pelvis on 04/16/2017 showing no acute finding in the abdomen and pelvis. Normal GI tract. Post-cholecystectomy. Stable hepatic hypodensity consistent with benign lesion. Aortic Atherosclerosis (ICD10-I70.0).   REVIEW OF SYSTEMS: Brittany Kirk reports that she continues to lose weight. She lost 12 lbs since October, in addition to the 40lbs during 2018. She was not trying to loss weight. She notes that her voice is hoarse. She has been getting headaches that occur not everyday, but very often. She experiences nausea after breakfast, but worst after lunch when she drinks a Boost. She also has lower back pain. She wakes up with a warm, burning sensation down each arm and across the chest that extends to the back. She denies unusual headaches, visual changes, vomiting, or dizziness. There has been no unusual cough, phlegm production, or pleurisy. This been no change in bowel or bladder habits. She denies unexplained fatigue, bleeding, rash, or fever. A detailed review of systems was otherwise stable.     PAST MEDICAL HISTORY: Past Medical History:  Diagnosis Date  . Allergic rhinitis   . Anxiety   . Asymptomatic bilateral carotid artery stenosis   . CAD (coronary artery disease)   . Cancer ([facility])   . Complication of anesthesia    sore throat after a surgery  . Depression   . Emphysema    no longer uses inhalers  . Gallstones    nausea, pain upper right abdomen  . GERD (  gastroesophageal reflux disease)   . H/O hiatal hernia   . Headache   . History of skin cancer   . Hyperlipidemia   . Hypertension   . Shortness of breath    sob if walks up flight  of stairs  . Tobacco user   . Wears dentures    top    PAST SURGICAL HISTORY: Past Surgical History:  Procedure Laterality Date  . BREAST LUMPECTOMY Right 2015  . CHOLECYSTECTOMY  07/08/2012   Procedure: LAPAROSCOPIC CHOLECYSTECTOMY WITH INTRAOPERATIVE CHOLANGIOGRAM;  Surgeon: Gayland Curry, MD,FACS;  Location: [facility];  Service: General;  Laterality: N/A;  Laparoscopic Cholecystectomy with Intraoperative Cholangiogram  . COLONOSCOPY    . ESOPHAGOSCOPY N/A 05/29/2017   Procedure: ESOPHAGOSCOPY;  Surgeon: Clarene Essex, MD;  Location: [facility];  Service: Endoscopy;  Laterality: N/A;  botox injection  . HAND SURGERY Left    wrist  . TONSILECTOMY, ADENOIDECTOMY, BILATERAL MYRINGOTOMY AND TUBES    . TUBAL LIGATION      FAMILY HISTORY Family History  Problem Relation Age of Onset  . Arthritis Mother   . Heart disease Unknown        5 brothers and 2 sister  . Diverticulitis Sister   . Aplastic anemia Other    the patient's father died at the age of 66. The patient's mother died at the age of 41 from pancreatic cancer which had been diagnosed a year earlier. The patient had 6 brothers, 2 sisters. There is no history of breast or ovarian cancer in the family.  GYNECOLOGIC HISTORY:  No LMP recorded. Patient is postmenopausal. Menarche age 65, first live birth age 38. The patient is GX P1. She went through menopause at approximately age 81. She did not take hormone replacement.   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 She has a son from her first marriage, Brittany Kirk, who works in [place] as a Dealer. The patient has 2 biological grandchildren, including a 42 year old grandson who is undergoing bone marrow transplant at [facility] for aplastic anemia 03/25/2014. Brittany Kirk's current, second husband, Brittany Kirk"), worked in Arboriculturist. He is now retired. He also has 2 grandchildren from an  earlier marriage. He is a Airline pilot. The patient attends a local [employer].    ADVANCED DIRECTIVES: Not in place   HEALTH MAINTENANCE: Social History   Tobacco Use  . Smoking status: Former Smoker    Packs/day: 0.50    Years: 40.00    Pack years: 20.00    Types: Cigarettes    Last attempt to quit: 02/13/2011    Years since quitting: 6.4  . Smokeless tobacco: Never Used  Substance Use Topics  . Alcohol use: No  . Drug use: No     Colonoscopy: 07/10/2017 with results showing: LG Intestine -Ascending Colon, Polyp: Sessile serrated polyp (hu).   PAP:  Bone density: At Dr. Christen Butter; osteopenia  Lipid panel:  Allergies  Allergen Reactions  . Penicillins Anaphylaxis, Itching, Swelling and Rash    Has patient had a PCN reaction causing immediate rash, facial/tongue/throat swelling, SOB or lightheadedness with hypotension: Yes Has patient had a PCN reaction causing severe rash involving mucus membranes or skin necrosis: No Has patient had a PCN reaction that required hospitalization: Yes Has patient had a PCN reaction occurring within the last 10 years: No If all of the above answers are "NO", then may proceed with Cephalosporin use.   . Cefaclor  Itching, Swelling and Rash  . Latex Itching and Rash  . Metronidazole Itching, Swelling and Rash  . Naproxen Itching, Swelling and Rash  . Nsaids Itching, Swelling and Rash    Ibuprofen IS tolerated  . Septra [Bactrim] Itching, Swelling and Rash  . Sulfonamide Derivatives Itching, Swelling and Rash  . Tape Itching and Rash    Current Outpatient Medications  Medication Sig Dispense Refill  . anastrozole (ARIMIDEX) 1 MG tablet Take 1 tablet (1 mg total) by mouth daily. 90 tablet 3  . aspirin 81 MG tablet Take 81 mg by mouth daily.    Marland Kitchen atorvastatin (LIPITOR) 40 MG tablet Take 1 tablet (40 mg total) by mouth at bedtime. 90 tablet 1  . Cyanocobalamin (B-12) 2500 MCG TABS Take 2,500 mcg by mouth daily.    . diazepam (VALIUM) 5 MG  tablet Take 5 mg by mouth at bedtime  3  . escitalopram (LEXAPRO) 20 MG tablet Take 20 mg by mouth daily at 12 noon.     Marland Kitchen ibuprofen (ADVIL,MOTRIN) 200 MG tablet Take 400 mg by mouth daily as needed for headache or moderate pain.    . Multiple Vitamin (MULTIVITAMIN) tablet Take 1 tablet by mouth daily.    Marland Kitchen omeprazole (PRILOSEC) 40 MG capsule Take 40 mg by mouth daily at 3 pm.    . ondansetron (ZOFRAN) 4 MG tablet Take 4 mg by mouth every 8 (eight) hours as needed for nausea/vomiting.     No current facility-administered medications for this visit.     OBJECTIVE: Middle-aged white woman who appears depressed  Vitals:   08/06/17 1213  BP: (!) 148/63  Pulse: 63  Resp: 16  Temp: 97.9 F (36.6 C)  SpO2: 100%     Body mass index is 22.31 kg/m.    ECOG FS:1 - Symptomatic but completely ambulatory  Sclerae unicteric, pupils round and equal Oropharynx clear and moist No cervical or supraclavicular adenopathy Lungs no rales or rhonchi Heart regular rate and rhythm Abd soft, nontender, positive bowel sounds MSK no focal spinal tenderness today, no upper extremity lymphedema Neuro: nonfocal, well oriented, appropriate affect Breasts the right breast is status post lumpectomy.  I do not palpate any suspicious findings.  The left breast is benign.  Both axillae are benign.  LAB RESULTS:  CMP     Component Value Date/Time   NA 139 03/15/2017 1300   NA 141 02/25/2017 1415   K 4.1 03/15/2017 1300   K 3.9 02/25/2017 1415   CL 102 03/15/2017 1300   CO2 24 03/15/2017 1300   CO2 26 02/25/2017 1415   GLUCOSE 94 03/15/2017 1300   GLUCOSE 110 02/25/2017 1415   BUN 17 03/15/2017 1300   BUN 14.1 02/25/2017 1415   CREATININE 0.99 (H) 03/15/2017 1300   CREATININE 1.0 02/25/2017 1415   CALCIUM 9.2 03/15/2017 1300   CALCIUM 9.2 02/25/2017 1415   PROT 6.1 03/15/2017 1300   PROT 6.2 02/27/2017 0940   PROT 6.5 02/25/2017 1415   ALBUMIN 3.9 02/27/2017 0940   ALBUMIN 3.5 02/25/2017 1415   AST  15 03/15/2017 1300   AST 16 02/25/2017 1415   ALT 10 03/15/2017 1300   ALT 10 02/25/2017 1415   ALKPHOS 76 02/27/2017 0940   ALKPHOS 74 02/25/2017 1415   BILITOT 0.6 03/15/2017 1300   BILITOT 0.6 02/27/2017 0940   BILITOT 0.56 02/25/2017 1415   GFRNONAA 57 (L) 03/15/2017 1300   GFRAA 66 03/15/2017 1300    I No results found for: SPEP  Lab Results  Component Value Date   WBC 9.0 03/15/2017   NEUTROABS 6,300 03/15/2017   HGB 13.6 03/15/2017   HCT 39.2 03/15/2017   MCV 94.2 03/15/2017   PLT 240 03/15/2017      Chemistry      Component Value Date/Time   NA 139 03/15/2017 1300   NA 141 02/25/2017 1415   K 4.1 03/15/2017 1300   K 3.9 02/25/2017 1415   CL 102 03/15/2017 1300   CO2 24 03/15/2017 1300   CO2 26 02/25/2017 1415   BUN 17 03/15/2017 1300   BUN 14.1 02/25/2017 1415   CREATININE 0.99 (H) 03/15/2017 1300   CREATININE 1.0 02/25/2017 1415      Component Value Date/Time   CALCIUM 9.2 03/15/2017 1300   CALCIUM 9.2 02/25/2017 1415   ALKPHOS 76 02/27/2017 0940   ALKPHOS 74 02/25/2017 1415   AST 15 03/15/2017 1300   AST 16 02/25/2017 1415   ALT 10 03/15/2017 1300   ALT 10 02/25/2017 1415   BILITOT 0.6 03/15/2017 1300   BILITOT 0.6 02/27/2017 0940   BILITOT 0.56 02/25/2017 1415       No results found for: LABCA2  No components found for: LABCA125  No results for input(s): INR in the last 168 hours.  Urinalysis    Component Value Date/Time   COLORURINE AMBER (A) 07/06/2016 1607    STUDIES: She had extensive evaluation by Dr. Lu Duffel God in December including EGD which showed abnormal esophageal motility suspicious for achalasia.  The stomach was normal as was a CT of the abdomen   ASSESSMENT: 72 y.o. [place] woman status post right upper outer quadrant lumpectomy and axillary lymph node dissection 02/19/2014 for a pT1c pN0, stage IA invasive ductal carcinoma, grade 1, estrogen and progesterone receptor positive, HER-2 not amplified, with an MIB-1 of  12%  (1) Oncotype score of 18 predicts a risk of outside the breast recurrence of 11% if the patient's only systemic treatment is tamoxifen for 5 years. It also suggests no benefit from chemotherapy  (2) the patient opted against adjuvant radiation  (3) anastrozole started 04/18/2014  (a) [facility] scan [facility] August 20 03/01/2015 showed a T score of -3.2 (osteoporosis)  PLAN: Cinda is now 3-1/2 years out from definitive surgery for her breast cancer with no clear evidence of disease recurrence.  This is very favorable.  She has lost a great deal of weight and is very concerned that she has occult carcinoma.  She had a very thorough evaluation by Dr. Lu Duffel got late last year which did not show any evidence of that by EGD or CT scan of the abdomen.  We stop the anastrozole 3 weeks ago to make sure that was not contributing it.  It has made no difference and therefore she is going back on the anastrozole.  I am going to obtain a CT scan of the chest abdomen and pelvis as well as some tumor markers.  She will see me in about 10 days to discuss those results.  If those are not informative we will consider referral for further evaluation of the possibility of esophageal dysmotility as the main problem    Brittany Kirk, Virgie Dad, MD  08/06/17 12:39 PM Medical Oncology and Hematology [facility] [address] [place], [place] [address] Tel. [phone]    Fax. [phone]    This document serves as a record of services personally performed by Lurline Del, MD. It was created on his behalf  by Sheron Nightingale, a trained medical scribe. The creation of this record is based on the scribe's personal observations and the provider's statements to them.   I have reviewed the above documentation for accuracy and completeness, and I agree with the above.

## 2017-08-06 NOTE — Telephone Encounter (Signed)
Gave patient AVS and calendar of upcoming march appointments.

## 2017-08-07 LAB — CA 125: Cancer Antigen (CA) 125: 8.1 U/mL (ref 0.0–38.1)

## 2017-08-07 LAB — CANCER ANTIGEN 19-9: CA 19-9: 12 U/mL (ref 0–35)

## 2017-08-13 ENCOUNTER — Ambulatory Visit (HOSPITAL_COMMUNITY)
Admission: RE | Admit: 2017-08-13 | Discharge: 2017-08-13 | Disposition: A | Discharge status: Home / Self Care | Payer: Medicare Other | Source: Ambulatory Visit | Attending: Oncology | Admitting: Oncology

## 2017-08-13 ENCOUNTER — Encounter (HOSPITAL_COMMUNITY): Payer: Self-pay

## 2017-08-13 DIAGNOSIS — Z170 Estrogen receptor positive status [ER+]: Secondary | ICD-10-CM | POA: Diagnosis not present

## 2017-08-13 DIAGNOSIS — I779 Disorder of arteries and arterioles, unspecified: Secondary | ICD-10-CM | POA: Diagnosis not present

## 2017-08-13 DIAGNOSIS — I739 Peripheral vascular disease, unspecified: Secondary | ICD-10-CM

## 2017-08-13 DIAGNOSIS — R590 Localized enlarged lymph nodes: Secondary | ICD-10-CM | POA: Diagnosis not present

## 2017-08-13 DIAGNOSIS — E278 Other specified disorders of adrenal gland: Secondary | ICD-10-CM | POA: Diagnosis not present

## 2017-08-13 DIAGNOSIS — C50411 Malignant neoplasm of upper-outer quadrant of right female breast: Secondary | ICD-10-CM | POA: Diagnosis not present

## 2017-08-13 MED ORDER — IOPAMIDOL (ISOVUE-300) INJECTION 61%
INTRAVENOUS | Status: AC
  Filled 2017-08-13: qty 100

## 2017-08-13 MED ORDER — SODIUM CHLORIDE 0.9 % IJ SOLN
INTRAMUSCULAR | Status: AC
  Filled 2017-08-13: qty 50

## 2017-08-13 MED ORDER — IOPAMIDOL (ISOVUE-300) INJECTION 61%
100.0000 mL | Freq: Once | INTRAVENOUS | Status: AC | PRN
  Administered 2017-08-13: 80 mL via INTRAVENOUS

## 2017-08-15 NOTE — Progress Notes (Signed)
[facility]  [phone]) [phone] [phone]) [phone]     ID: Brittany Kirk DOB: 1946/04/03  MR#: [id number]  PYK#:[id number]  Patient Care Team: Susy Frizzle, MD as PCP - General (Family Medicine) Elsie Stain, MD (Pulmonary Disease) Tabathia Knoche, Virgie Dad, MD as Consulting Physician (Oncology) Thea Silversmith, MD (Inactive) as Consulting Physician (Radiation Oncology) Dian Queen, MD as Consulting Physician (Obstetrics and Gynecology) Clarene Essex, MD as Consulting Physician (Gastroenterology) OTHER MD: Griselda Miner M.D.  CHIEF COMPLAINT: Early stage estrogen receptor positive breast cancer  CURRENT TREATMENT: anastrozole   BREAST CANCER HISTORY: From the earlier intake note:  Brittany Kirk underwent routine screening mammography August of 2015 showing a possible mass in the right breast. On 01/19/2014 at the breast center she underwent right diagnostic mammography and ultrasonography. The breast density was category B. The mass appeared spiculated on spot compression views and an ultrasound it was nearly isoechoic. It was located at the 10:00 position 4 cm from the nipple and measured 7.5 mm by ultrasonography. Ultrasound of the right axilla was negative.  On 01/26/2014 the patient underwent biopsy of the mass in question and this showed ([id number]) and invasive ductal carcinoma, grade 1, estrogen receptor 100% positive, progesterone receptor 60% positive, both with strong staining intensity, with an MIB-1 of 12%, and no HER-2 modification, the signals ratio being 1.38 and the number per cell 3.30.  On 02/19/2014 the patient underwent right lumpectomy. Sentinel lymph node sampling was attempted but failed and accordingly the patient received a full axillary lymph node dissection. The final pathology from this procedure ([id number]) showed an invasive ductal carcinoma, 1.4 cm, grade 1, with negative margins, and all 19 lymph nodes clear.  Her subsequent  history is as detailed below  INTERVAL HISTORY: Envi returns today for follow-up and treatment of her estrogen receptor positive breast cancer. She continues on anastrozole, with good tolerance. She experiences hot flashes and burning sensations in the morning that occur in upper back area and extends down her arms. She uses RePhresh for vaginal dryness issues.   She completed a chest, abdomen and pelvis CT on 08/13/2017 showing: No findings specific for recurrent or metastatic disease. Status post right axillary lymph node dissection. Faint subpleural micronodularity measuring up to 3 mm, predominantly in the bilateral upper lobes. 1.8 cm left adrenal nodule, only minimally increased from 2014, suggesting a benign adrenal adenoma.   REVIEW OF SYSTEMS: [name] reports that she still acid reflux issues after taking Prilosec. She takes it in the afternoons, but she will try taking it in the morning. She has some nausea and she lost 2 lbs since her last visit. She walks for about 20 minutes when the weather is nice. She is frequently tired, and has a loss of appetite. She has intense nausea after drinking boost milkshakes.  She denies unusual headaches, visual changes, nausea, vomiting, or dizziness. There has been no unusual cough, phlegm production, or pleurisy. This been no change in bowel or bladder habits. She denies unexplained fatigue or unexplained weight loss, bleeding, rash, or fever. A detailed review of systems was otherwise stable.     PAST MEDICAL HISTORY: Past Medical History:  Diagnosis Date  . Allergic rhinitis   . Anxiety   . Asymptomatic bilateral carotid artery stenosis   . CAD (coronary artery disease)   . Cancer ([place])   . Complication of anesthesia    sore throat after a surgery  . Depression   . Emphysema    no longer  uses inhalers  . Gallstones    nausea, pain upper right abdomen  . GERD (gastroesophageal reflux disease)   . H/O hiatal hernia   . Headache   .  History of skin cancer   . Hyperlipidemia   . Hypertension   . Shortness of breath    sob if walks up flight of stairs  . Tobacco user   . Wears dentures    top    PAST SURGICAL HISTORY: Past Surgical History:  Procedure Laterality Date  . BREAST LUMPECTOMY Right 2015  . CHOLECYSTECTOMY  07/08/2012   Procedure: LAPAROSCOPIC CHOLECYSTECTOMY WITH INTRAOPERATIVE CHOLANGIOGRAM;  Surgeon: Gayland Curry, MD,FACS;  Location: [facility];  Service: General;  Laterality: N/A;  Laparoscopic Cholecystectomy with Intraoperative Cholangiogram  . COLONOSCOPY    . ESOPHAGOSCOPY N/A 05/29/2017   Procedure: ESOPHAGOSCOPY;  Surgeon: Clarene Essex, MD;  Location: [facility];  Service: Endoscopy;  Laterality: N/A;  botox injection  . HAND SURGERY Left    wrist  . TONSILECTOMY, ADENOIDECTOMY, BILATERAL MYRINGOTOMY AND TUBES    . TUBAL LIGATION      FAMILY HISTORY Family History  Problem Relation Age of Onset  . Arthritis Mother   . Heart disease Unknown        5 brothers and 2 sister  . Diverticulitis Sister   . Aplastic anemia Other    the patient's father died at the age of 3. The patient's mother died at the age of 20 from pancreatic cancer which had been diagnosed a year earlier. The patient had 6 brothers, 2 sisters. There is no history of breast or ovarian cancer in the family.  GYNECOLOGIC HISTORY:  No LMP recorded. Patient is postmenopausal. Menarche age 72, first live birth age 72. The patient is GX P1. She went through menopause at approximately age 72. She did not take hormone replacement.   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 She has a son from her first marriage, Brittany Kirk, who works in [place] as a Dealer. The patient has 2 biological grandchildren, including a 22 year old grandson who is undergoing bone marrow transplant at [facility] for aplastic anemia 03/25/2014. Brittany Kirk's current, second husband,  Brittany Kirk"), worked in Arboriculturist. He is now retired. He also has 2 grandchildren from an earlier marriage. He is a Airline pilot. The patient attends a local [place].    ADVANCED DIRECTIVES: Not in place   HEALTH MAINTENANCE: Social History   Tobacco Use  . Smoking status: Former Smoker    Packs/day: 0.50    Years: 40.00    Pack years: 20.00    Types: Cigarettes    Last attempt to quit: 02/13/2011    Years since quitting: 6.5  . Smokeless tobacco: Never Used  Substance Use Topics  . Alcohol use: No  . Drug use: No     Colonoscopy: 07/10/2017 with results showing: LG Intestine -Ascending Colon, Polyp: Sessile serrated polyp (hu).   PAP:  Bone density: At Dr. Christen Butter; osteopenia  Lipid panel:  Allergies  Allergen Reactions  . Penicillins Anaphylaxis, Itching, Swelling and Rash    Has patient had a PCN reaction causing immediate rash, facial/tongue/throat swelling, SOB or lightheadedness with hypotension: Yes Has patient had a PCN reaction causing severe rash involving mucus membranes or skin necrosis: No Has patient had a PCN reaction that required hospitalization: Yes Has patient had a PCN reaction occurring within the last 10 years: No If all  of the above answers are "NO", then may proceed with Cephalosporin use.   . Cefaclor Itching, Swelling and Rash  . Latex Itching and Rash  . Metronidazole Itching, Swelling and Rash  . Naproxen Itching, Swelling and Rash  . Nsaids Itching, Swelling and Rash    Ibuprofen IS tolerated  . Septra [Bactrim] Itching, Swelling and Rash  . Sulfonamide Derivatives Itching, Swelling and Rash  . Tape Itching and Rash    Current Outpatient Medications  Medication Sig Dispense Refill  . anastrozole (ARIMIDEX) 1 MG tablet Take 1 tablet (1 mg total) by mouth daily. 90 tablet 3  . aspirin 81 MG tablet Take 81 mg by mouth daily.    Marland Kitchen atorvastatin (LIPITOR) 40 MG tablet Take 1 tablet (40 mg total) by mouth at bedtime. 90  tablet 1  . Cyanocobalamin (B-12) 2500 MCG TABS Take 2,500 mcg by mouth daily.    . diazepam (VALIUM) 5 MG tablet Take 5 mg by mouth at bedtime  3  . escitalopram (LEXAPRO) 20 MG tablet Take 20 mg by mouth daily at 12 noon.     Marland Kitchen ibuprofen (ADVIL,MOTRIN) 200 MG tablet Take 400 mg by mouth daily as needed for headache or moderate pain.    . Multiple Vitamin (MULTIVITAMIN) tablet Take 1 tablet by mouth daily.    Marland Kitchen omeprazole (PRILOSEC) 40 MG capsule Take 40 mg by mouth daily at 3 pm.    . ondansetron (ZOFRAN) 4 MG tablet Take 4 mg by mouth every 8 (eight) hours as needed for nausea/vomiting.     No current facility-administered medications for this visit.     OBJECTIVE: Middle-aged white woman who appears stated age 72:   08/16/17 1211  BP: (!) 166/57  Pulse: 67  Resp: 16  Temp: 97.8 F (36.6 C)  SpO2: 99%     Body mass index is 21.82 kg/m.    ECOG FS:1 - Symptomatic but completely ambulatory  Sclerae unicteric, EOMs intact No cervical or supraclavicular adenopathy Lungs no rales or rhonchi Heart regular rate and rhythm Abd soft, nontender, positive bowel sounds MSK no focal spinal tenderness, no upper extremity lymphedema Neuro: nonfocal, well oriented, appropriate affect Breasts: Deferred  LAB RESULTS:  CMP     Component Value Date/Time   NA 142 08/06/2017 1253   NA 141 02/25/2017 1415   K 4.1 08/06/2017 1253   K 3.9 02/25/2017 1415   CL 105 08/06/2017 1253   CO2 27 08/06/2017 1253   CO2 26 02/25/2017 1415   GLUCOSE 99 08/06/2017 1253   GLUCOSE 110 02/25/2017 1415   BUN 17 08/06/2017 1253   BUN 14.1 02/25/2017 1415   CREATININE 0.86 08/06/2017 1253   CREATININE 0.99 (H) 03/15/2017 1300   CREATININE 1.0 02/25/2017 1415   CALCIUM 9.7 08/06/2017 1253   CALCIUM 9.2 02/25/2017 1415   PROT 6.7 08/06/2017 1253   PROT 6.2 02/27/2017 0940   PROT 6.5 02/25/2017 1415   ALBUMIN 3.8 08/06/2017 1253   ALBUMIN 3.9 02/27/2017 0940   ALBUMIN 3.5 02/25/2017 1415   AST 15  08/06/2017 1253   AST 16 02/25/2017 1415   ALT 12 08/06/2017 1253   ALT 10 02/25/2017 1415   ALKPHOS 71 08/06/2017 1253   ALKPHOS 74 02/25/2017 1415   BILITOT 0.7 08/06/2017 1253   BILITOT 0.6 02/27/2017 0940   BILITOT 0.56 02/25/2017 1415   GFRNONAA >60 08/06/2017 1253   GFRNONAA 57 (L) 03/15/2017 1300   GFRAA >60 08/06/2017 1253   GFRAA 66 03/15/2017 1300  I No results found for: SPEP  Lab Results  Component Value Date   WBC 7.9 08/06/2017   NEUTROABS 5.4 08/06/2017   HGB 13.4 08/06/2017   HCT 41.3 08/06/2017   MCV 101.0 08/06/2017   PLT 230 08/06/2017      Chemistry      Component Value Date/Time   NA 142 08/06/2017 1253   NA 141 02/25/2017 1415   K 4.1 08/06/2017 1253   K 3.9 02/25/2017 1415   CL 105 08/06/2017 1253   CO2 27 08/06/2017 1253   CO2 26 02/25/2017 1415   BUN 17 08/06/2017 1253   BUN 14.1 02/25/2017 1415   CREATININE 0.86 08/06/2017 1253   CREATININE 0.99 (H) 03/15/2017 1300   CREATININE 1.0 02/25/2017 1415      Component Value Date/Time   CALCIUM 9.7 08/06/2017 1253   CALCIUM 9.2 02/25/2017 1415   ALKPHOS 71 08/06/2017 1253   ALKPHOS 74 02/25/2017 1415   AST 15 08/06/2017 1253   AST 16 02/25/2017 1415   ALT 12 08/06/2017 1253   ALT 10 02/25/2017 1415   BILITOT 0.7 08/06/2017 1253   BILITOT 0.6 02/27/2017 0940   BILITOT 0.56 02/25/2017 1415       No results found for: LABCA2  No components found for: LABCA125  No results for input(s): INR in the last 168 hours.  Urinalysis    Component Value Date/Time   COLORURINE AMBER (A) 07/06/2016 1607    STUDIES: Ct Chest W Contrast  Result Date: 08/13/2017 CLINICAL DATA:  Right breast cancer, diagnosed 2015, oral chemotherapy in progress. Weight loss. Lower abdominal pain x2 months. EXAM: CT CHEST, ABDOMEN, AND PELVIS WITH CONTRAST TECHNIQUE: Multidetector CT imaging of the chest, abdomen and pelvis was performed following the standard protocol during bolus administration of intravenous  contrast. CONTRAST:  69m ISOVUE-300 IOPAMIDOL (ISOVUE-300) INJECTION 61% COMPARISON:  CT abdomen/pelvis dated 04/16/2017. FINDINGS: CT CHEST FINDINGS Cardiovascular: The heart is normal in size. No pericardial effusion. No evidence of thoracic aortic aneurysm. Atherosclerotic calcifications of the aortic arch. Mild coronary atherosclerosis the LAD and right coronary artery. Mediastinum/Nodes: No suspicious mediastinal, hilar, or axillary lymphadenopathy. Status post right axillary lymph node dissection. Small bilateral thyroid nodules, measuring up to 13 mm on the left. Lungs/Pleura: Mild lingular scarring/atelectasis. No focal consolidation. Faint subpleural micronodularity, predominant in the bilateral upper lobes, measuring up to 3 mm (for example, series 7/images 22, 24, 25, 27, and 29). This appearance is not suspicious for metastatic disease. No pleural effusion or pneumothorax. Musculoskeletal: Visualized osseous structures are within normal limits. CT ABDOMEN PELVIS FINDINGS Hepatobiliary: Liver is within normal limits 5 mm probable cyst in segment 6 (series 2/image 55), unchanged. Status post cholecystectomy. No intrahepatic or extrahepatic ductal dilatation. Pancreas: Within normal limits. Spleen: Within normal limits. Adrenals/Urinary Tract: 1.8 cm left adrenal nodule, previously 1.6 cm in 2014, likely reflecting a benign adrenal adenoma. Right adrenal glands within normal limits. 9 mm anterior left upper pole renal cyst (series 4/image 19). Right kidney is within normal limits. Duplicated right renal collecting system. No hydronephrosis. Bladder is within normal limits. Stomach/Bowel: Stomach is notable for a small hiatal hernia. No evidence of bowel obstruction. Normal appendix (series 2/image 89). Vascular/Lymphatic: No evidence of abdominal aortic aneurysm. Atherosclerotic calcifications of the abdominal aorta and branch vessels. No suspicious abdominopelvic lymphadenopathy. Reproductive: Uterus is  within normal limits. Bilateral ovaries are within normal limits. Other: No abdominopelvic ascites. Musculoskeletal: Stable sclerotic lesion in the left iliac bone (series 2/image 85), likely benign. IMPRESSION: No  findings specific for recurrent or metastatic disease. Status post right axillary lymph node dissection. Faint subpleural micronodularity measuring up to 3 mm, predominantly in the bilateral upper lobes. Consider follow-up CT chest in 6 months to confirm stability. However, this appearance is not worrisome for metastatic disease. 1.8 cm left adrenal nodule, only minimally increased from 2014, suggesting a benign adrenal adenoma. Electronically Signed   By: Julian Hy M.D.   On: 08/13/2017 17:30   Ct Abdomen Pelvis W Contrast  Result Date: 08/13/2017 CLINICAL DATA:  Right breast cancer, diagnosed 2015, oral chemotherapy in progress. Weight loss. Lower abdominal pain x2 months. EXAM: CT CHEST, ABDOMEN, AND PELVIS WITH CONTRAST TECHNIQUE: Multidetector CT imaging of the chest, abdomen and pelvis was performed following the standard protocol during bolus administration of intravenous contrast. CONTRAST:  65m ISOVUE-300 IOPAMIDOL (ISOVUE-300) INJECTION 61% COMPARISON:  CT abdomen/pelvis dated 04/16/2017. FINDINGS: CT CHEST FINDINGS Cardiovascular: The heart is normal in size. No pericardial effusion. No evidence of thoracic aortic aneurysm. Atherosclerotic calcifications of the aortic arch. Mild coronary atherosclerosis the LAD and right coronary artery. Mediastinum/Nodes: No suspicious mediastinal, hilar, or axillary lymphadenopathy. Status post right axillary lymph node dissection. Small bilateral thyroid nodules, measuring up to 13 mm on the left. Lungs/Pleura: Mild lingular scarring/atelectasis. No focal consolidation. Faint subpleural micronodularity, predominant in the bilateral upper lobes, measuring up to 3 mm (for example, series 7/images 22, 24, 25, 27, and 29). This appearance is not  suspicious for metastatic disease. No pleural effusion or pneumothorax. Musculoskeletal: Visualized osseous structures are within normal limits. CT ABDOMEN PELVIS FINDINGS Hepatobiliary: Liver is within normal limits 5 mm probable cyst in segment 6 (series 2/image 55), unchanged. Status post cholecystectomy. No intrahepatic or extrahepatic ductal dilatation. Pancreas: Within normal limits. Spleen: Within normal limits. Adrenals/Urinary Tract: 1.8 cm left adrenal nodule, previously 1.6 cm in 2014, likely reflecting a benign adrenal adenoma. Right adrenal glands within normal limits. 9 mm anterior left upper pole renal cyst (series 4/image 19). Right kidney is within normal limits. Duplicated right renal collecting system. No hydronephrosis. Bladder is within normal limits. Stomach/Bowel: Stomach is notable for a small hiatal hernia. No evidence of bowel obstruction. Normal appendix (series 2/image 89). Vascular/Lymphatic: No evidence of abdominal aortic aneurysm. Atherosclerotic calcifications of the abdominal aorta and branch vessels. No suspicious abdominopelvic lymphadenopathy. Reproductive: Uterus is within normal limits. Bilateral ovaries are within normal limits. Other: No abdominopelvic ascites. Musculoskeletal: Stable sclerotic lesion in the left iliac bone (series 2/image 85), likely benign. IMPRESSION: No findings specific for recurrent or metastatic disease. Status post right axillary lymph node dissection. Faint subpleural micronodularity measuring up to 3 mm, predominantly in the bilateral upper lobes. Consider follow-up CT chest in 6 months to confirm stability. However, this appearance is not worrisome for metastatic disease. 1.8 cm left adrenal nodule, only minimally increased from 2014, suggesting a benign adrenal adenoma. Electronically Signed   By: SJulian HyM.D.   On: 08/13/2017 17:30    ASSESSMENT: 72y.[place]. [place] status post right upper outer quadrant lumpectomy and  axillary lymph node dissection 02/19/2014 for a pT1c pN0, stage IA invasive ductal carcinoma, grade 1, estrogen and progesterone receptor positive, HER-2 not amplified, with an MIB-1 of 12%  (1) Oncotype score of 18 predicts a risk of outside the breast recurrence of 11% if the patient's only systemic treatment is tamoxifen for 5 years. It also suggests no benefit from chemotherapy  (2) the patient opted against adjuvant radiation  (3) anastrozole started 04/18/2014  (a) [facility]  [facility] August 20 03/01/2015 showed a T score of -3.2 (osteoporosis)  (4) malnutrition: Possibly secondary to reflux  (5) osteoporosis  PLAN: Conleigh is now 3-1/2 years out from definitive surgery for her breast cancer with no evidence of disease recurrence.  This is very favorable.  She has had a significant amount of weight loss.  Partly this could be due to depression.  I think perhaps more likely it is due to her reflux and other GI problems.  Her gastroenterologist started her on Prilosec and that helped a little but she seems to be taking it in the middle of the day and I think it would be better if she took it in the morning.  I am adding metoclopramide 5 mg to take before meals.  We discussed the possible toxicity side effects and complications of this agent including "jitteriness".  If she tolerates it well and it works for her we can continue that for perhaps 3 months after which we will probably stop because of concerns regarding long-term complications  We reviewed her scans which show no evidence of cancer.  She also had multiple tumor markers which were negative  On the other hand she is osteoporotic.  We discussed bisphosphonates versus denosumab.  She has a good understanding of the possible toxicity side effects and complications including the rare possibility of osteonecrosis of the jaw.  After much discussion she decided she would like to try Prolia and she will start that next  week  She knows to call for any other issues that may develop before her next visit here which will be in 6 months.    Annalyn Blecher, Virgie Dad, MD  08/16/17 12:43 PM Medical Oncology and Hematology [facility] [address] [place], [place] [address] Tel. [phone]    Fax. [phone]    This document serves as a record of services personally performed by Lurline Del, MD. It was created on his behalf by Sheron Nightingale, a trained medical scribe. The creation of this record is based on the scribe's personal observations and the provider's statements to them.   I have reviewed the above documentation for accuracy and completeness, and I agree with the above.

## 2017-08-16 ENCOUNTER — Telehealth: Payer: Self-pay | Admitting: Oncology

## 2017-08-16 ENCOUNTER — Inpatient Hospital Stay: Payer: Medicare Other | Attending: Oncology | Admitting: Oncology

## 2017-08-16 VITALS — BP 166/57 | HR 67 | Temp 97.8°F | Resp 16 | Ht 61.0 in | Wt 115.5 lb

## 2017-08-16 DIAGNOSIS — E46 Unspecified protein-calorie malnutrition: Secondary | ICD-10-CM

## 2017-08-16 DIAGNOSIS — Z170 Estrogen receptor positive status [ER+]: Secondary | ICD-10-CM

## 2017-08-16 DIAGNOSIS — C50411 Malignant neoplasm of upper-outer quadrant of right female breast: Secondary | ICD-10-CM | POA: Diagnosis not present

## 2017-08-16 DIAGNOSIS — M810 Age-related osteoporosis without current pathological fracture: Secondary | ICD-10-CM | POA: Diagnosis not present

## 2017-08-16 DIAGNOSIS — M818 Other osteoporosis without current pathological fracture: Secondary | ICD-10-CM

## 2017-08-16 DIAGNOSIS — K219 Gastro-esophageal reflux disease without esophagitis: Secondary | ICD-10-CM

## 2017-08-16 DIAGNOSIS — Z79811 Long term (current) use of aromatase inhibitors: Secondary | ICD-10-CM | POA: Diagnosis not present

## 2017-08-16 MED ORDER — METOCLOPRAMIDE HCL 5 MG PO TABS: 5.0000 mg | ORAL_TABLET | Freq: Three times a day (TID) | ORAL | 0 refills | Status: DC

## 2017-08-16 NOTE — Telephone Encounter (Signed)
Gave avs and calendar ° °

## 2017-08-19 ENCOUNTER — Telehealth: Payer: Self-pay | Admitting: Oncology

## 2017-08-19 NOTE — Telephone Encounter (Signed)
Patient called to cancel she did not want to reschedule

## 2017-08-23 ENCOUNTER — Ambulatory Visit: Payer: Medicare Other

## 2017-09-11 DIAGNOSIS — Z124 Encounter for screening for malignant neoplasm of cervix: Secondary | ICD-10-CM | POA: Diagnosis not present

## 2017-09-11 DIAGNOSIS — Z6822 Body mass index (BMI) 22.0-22.9, adult: Secondary | ICD-10-CM | POA: Diagnosis not present

## 2017-09-11 DIAGNOSIS — Z01419 Encounter for gynecological examination (general) (routine) without abnormal findings: Secondary | ICD-10-CM | POA: Diagnosis not present

## 2017-10-11 ENCOUNTER — Ambulatory Visit (HOSPITAL_COMMUNITY)
Admission: EM | Disposition: A | Discharge status: Home / Self Care | Payer: Self-pay | Source: Home / Self Care | Attending: Emergency Medicine

## 2017-10-11 ENCOUNTER — Encounter: Payer: Self-pay | Admitting: Cardiovascular Disease

## 2017-10-11 ENCOUNTER — Observation Stay (HOSPITAL_COMMUNITY)
Admission: EM | Admit: 2017-10-11 | Discharge: 2017-10-12 | Disposition: A | Discharge status: Home / Self Care | Payer: Medicare Other | Attending: Cardiovascular Disease | Admitting: Cardiovascular Disease

## 2017-10-11 ENCOUNTER — Encounter (HOSPITAL_COMMUNITY): Payer: Self-pay | Admitting: Emergency Medicine

## 2017-10-11 ENCOUNTER — Emergency Department (HOSPITAL_COMMUNITY): Payer: Medicare Other

## 2017-10-11 ENCOUNTER — Ambulatory Visit (INDEPENDENT_AMBULATORY_CARE_PROVIDER_SITE_OTHER): Payer: Medicare Other | Admitting: Cardiovascular Disease

## 2017-10-11 DIAGNOSIS — E663 Overweight: Secondary | ICD-10-CM | POA: Diagnosis not present

## 2017-10-11 DIAGNOSIS — I200 Unstable angina: Secondary | ICD-10-CM | POA: Diagnosis present

## 2017-10-11 DIAGNOSIS — I2510 Atherosclerotic heart disease of native coronary artery without angina pectoris: Secondary | ICD-10-CM | POA: Diagnosis not present

## 2017-10-11 DIAGNOSIS — E782 Mixed hyperlipidemia: Secondary | ICD-10-CM | POA: Diagnosis not present

## 2017-10-11 DIAGNOSIS — Z87891 Personal history of nicotine dependence: Secondary | ICD-10-CM | POA: Insufficient documentation

## 2017-10-11 DIAGNOSIS — R079 Chest pain, unspecified: Secondary | ICD-10-CM | POA: Diagnosis not present

## 2017-10-11 DIAGNOSIS — Z6821 Body mass index (BMI) 21.0-21.9, adult: Secondary | ICD-10-CM | POA: Diagnosis not present

## 2017-10-11 DIAGNOSIS — Z79899 Other long term (current) drug therapy: Secondary | ICD-10-CM | POA: Insufficient documentation

## 2017-10-11 DIAGNOSIS — Z881 Allergy status to other antibiotic agents status: Secondary | ICD-10-CM | POA: Diagnosis not present

## 2017-10-11 DIAGNOSIS — Z8249 Family history of ischemic heart disease and other diseases of the circulatory system: Secondary | ICD-10-CM | POA: Diagnosis not present

## 2017-10-11 DIAGNOSIS — Z880 Allergy status to penicillin: Secondary | ICD-10-CM | POA: Insufficient documentation

## 2017-10-11 DIAGNOSIS — Z882 Allergy status to sulfonamides status: Secondary | ICD-10-CM | POA: Insufficient documentation

## 2017-10-11 DIAGNOSIS — Z886 Allergy status to analgesic agent status: Secondary | ICD-10-CM | POA: Insufficient documentation

## 2017-10-11 DIAGNOSIS — I25110 Atherosclerotic heart disease of native coronary artery with unstable angina pectoris: Secondary | ICD-10-CM | POA: Diagnosis not present

## 2017-10-11 DIAGNOSIS — I10 Essential (primary) hypertension: Secondary | ICD-10-CM | POA: Diagnosis not present

## 2017-10-11 DIAGNOSIS — Z91040 Latex allergy status: Secondary | ICD-10-CM | POA: Insufficient documentation

## 2017-10-11 DIAGNOSIS — J449 Chronic obstructive pulmonary disease, unspecified: Secondary | ICD-10-CM | POA: Diagnosis not present

## 2017-10-11 DIAGNOSIS — Z853 Personal history of malignant neoplasm of breast: Secondary | ICD-10-CM | POA: Diagnosis not present

## 2017-10-11 DIAGNOSIS — N183 Chronic kidney disease, stage 3 (moderate): Secondary | ICD-10-CM | POA: Insufficient documentation

## 2017-10-11 DIAGNOSIS — I6523 Occlusion and stenosis of bilateral carotid arteries: Secondary | ICD-10-CM | POA: Diagnosis not present

## 2017-10-11 DIAGNOSIS — I129 Hypertensive chronic kidney disease with stage 1 through stage 4 chronic kidney disease, or unspecified chronic kidney disease: Secondary | ICD-10-CM | POA: Insufficient documentation

## 2017-10-11 DIAGNOSIS — Z79811 Long term (current) use of aromatase inhibitors: Secondary | ICD-10-CM | POA: Insufficient documentation

## 2017-10-11 DIAGNOSIS — Z7982 Long term (current) use of aspirin: Secondary | ICD-10-CM | POA: Diagnosis not present

## 2017-10-11 DIAGNOSIS — Z85828 Personal history of other malignant neoplasm of skin: Secondary | ICD-10-CM | POA: Insufficient documentation

## 2017-10-11 DIAGNOSIS — E785 Hyperlipidemia, unspecified: Secondary | ICD-10-CM | POA: Insufficient documentation

## 2017-10-11 DIAGNOSIS — Z888 Allergy status to other drugs, medicaments and biological substances status: Secondary | ICD-10-CM | POA: Diagnosis not present

## 2017-10-11 HISTORY — PX: LEFT HEART CATH AND CORONARY ANGIOGRAPHY: CATH118249

## 2017-10-11 LAB — COMPREHENSIVE METABOLIC PANEL
ALT: 13 U/L — ABNORMAL LOW (ref 14–54)
AST: 19 U/L (ref 15–41)
Albumin: 3.7 g/dL (ref 3.5–5.0)
Alkaline Phosphatase: 56 U/L (ref 38–126)
Anion gap: 15 (ref 5–15)
BUN: 17 mg/dL (ref 6–20)
CO2: 22 mmol/L (ref 22–32)
Calcium: 9.1 mg/dL (ref 8.9–10.3)
Chloride: 104 mmol/L (ref 101–111)
Creatinine, Ser: 1.02 mg/dL — ABNORMAL HIGH (ref 0.44–1.00)
GFR calc Af Amer: >60 mL/min (ref >60)
GFR calc non Af Amer: 53 mL/min — ABNORMAL LOW (ref >60)
Glucose, Bld: 94 mg/dL (ref 65–99)
Potassium: 4.3 mmol/L (ref 3.5–5.1)
Sodium: 141 mmol/L (ref 135–145)
Total Bilirubin: 0.8 mg/dL (ref 0.3–1.2)
Total Protein: 6.3 g/dL — ABNORMAL LOW (ref 6.5–8.1)

## 2017-10-11 LAB — CBC WITH DIFFERENTIAL/PLATELET
Basophils Absolute: 0 10*3/uL (ref 0.0–0.1)
Basophils Relative: 0 %
Eosinophils Absolute: 0 10*3/uL (ref 0.0–0.7)
Eosinophils Relative: 0 %
HCT: 40.8 % (ref 36.0–46.0)
Hemoglobin: 13.3 g/dL (ref 12.0–15.0)
Lymphocytes Relative: 30 %
Lymphs Abs: 2.4 10*3/uL (ref 0.7–4.0)
MCH: 32.5 pg (ref 26.0–34.0)
MCHC: 32.6 g/dL (ref 30.0–36.0)
MCV: 99.8 fL (ref 78.0–100.0)
Monocytes Absolute: 0.5 10*3/uL (ref 0.1–1.0)
Monocytes Relative: 6 %
Neutro Abs: 5.1 10*3/uL (ref 1.7–7.7)
Neutrophils Relative %: 64 %
Platelets: 209 10*3/uL (ref 150–400)
RBC: 4.09 MIL/uL (ref 3.87–5.11)
RDW: 13.6 % (ref 11.5–15.5)
WBC: 8.1 10*3/uL (ref 4.0–10.5)

## 2017-10-11 LAB — POCT ACTIVATED CLOTTING TIME: Activated Clotting Time: 131 seconds

## 2017-10-11 LAB — I-STAT TROPONIN, ED: Troponin i, poc: 0 ng/mL (ref 0.00–0.08)

## 2017-10-11 LAB — TSH: TSH: 0.378 u[IU]/mL (ref 0.350–4.500)

## 2017-10-11 LAB — TROPONIN I
Troponin I: <0.03 ng/mL (ref <0.03)
Troponin I: <0.03 ng/mL (ref <0.03)

## 2017-10-11 SURGERY — LEFT HEART CATH AND CORONARY ANGIOGRAPHY
Anesthesia: LOCAL

## 2017-10-11 MED ORDER — ONDANSETRON HCL 4 MG PO TABS: 4.0000 mg | ORAL_TABLET | Freq: Three times a day (TID) | ORAL | Status: DC | PRN

## 2017-10-11 MED ORDER — HEPARIN (PORCINE) IN NACL 1000-0.9 UT/500ML-% IV SOLN
INTRAVENOUS | Status: AC
  Filled 2017-10-11: qty 1000

## 2017-10-11 MED ORDER — SODIUM CHLORIDE 0.9 % IV SOLN
Freq: Once | INTRAVENOUS | Status: AC
  Administered 2017-10-11: 20:00:00 via INTRAVENOUS

## 2017-10-11 MED ORDER — ADULT MULTIVITAMIN W/MINERALS CH
1.0000 | ORAL_TABLET | Freq: Every day | ORAL | Status: DC
  Administered 2017-10-12: 1 via ORAL
  Filled 2017-10-11: qty 1

## 2017-10-11 MED ORDER — ATORVASTATIN CALCIUM 40 MG PO TABS
40.0000 mg | ORAL_TABLET | Freq: Every day | ORAL | Status: DC
  Administered 2017-10-11: 18:00:00 40 mg via ORAL
  Filled 2017-10-11: qty 1

## 2017-10-11 MED ORDER — LIDOCAINE HCL (PF) 1 % IJ SOLN
INTRAMUSCULAR | Status: DC | PRN
  Administered 2017-10-11: 15 mL via INTRADERMAL

## 2017-10-11 MED ORDER — ONDANSETRON HCL 4 MG/2ML IJ SOLN
4.0000 mg | Freq: Four times a day (QID) | INTRAMUSCULAR | Status: DC | PRN
Start: 2017-10-11 — End: 2017-10-12

## 2017-10-11 MED ORDER — FENTANYL CITRATE (PF) 100 MCG/2ML IJ SOLN
INTRAMUSCULAR | Status: AC
  Filled 2017-10-11: qty 2

## 2017-10-11 MED ORDER — DIAZEPAM 5 MG PO TABS: 5.0000 mg | ORAL_TABLET | Freq: Every evening | ORAL | Status: DC | PRN

## 2017-10-11 MED ORDER — ONDANSETRON HCL 4 MG/2ML IJ SOLN: 4.0000 mg | Freq: Four times a day (QID) | INTRAMUSCULAR | Status: DC | PRN

## 2017-10-11 MED ORDER — ISOSORBIDE MONONITRATE ER 30 MG PO TB24
30.0000 mg | ORAL_TABLET | Freq: Every day | ORAL | Status: DC
  Administered 2017-10-11: 30 mg via ORAL
  Filled 2017-10-11: qty 1

## 2017-10-11 MED ORDER — IOHEXOL 350 MG/ML SOLN
INTRAVENOUS | Status: DC | PRN
  Administered 2017-10-11: 50 mL via INTRA_ARTERIAL

## 2017-10-11 MED ORDER — FENTANYL CITRATE (PF) 100 MCG/2ML IJ SOLN
INTRAMUSCULAR | Status: DC | PRN
  Administered 2017-10-11: 25 ug via INTRAVENOUS

## 2017-10-11 MED ORDER — PANTOPRAZOLE SODIUM 40 MG PO TBEC
40.0000 mg | DELAYED_RELEASE_TABLET | Freq: Every day | ORAL | Status: DC
Start: 2017-10-12 — End: 2017-10-12
  Administered 2017-10-11 – 2017-10-12 (×2): 40 mg via ORAL
  Filled 2017-10-11 (×2): qty 1

## 2017-10-11 MED ORDER — ENOXAPARIN SODIUM 40 MG/0.4ML ~~LOC~~ SOLN: 40.0000 mg | SUBCUTANEOUS | Status: DC

## 2017-10-11 MED ORDER — ENOXAPARIN SODIUM 40 MG/0.4ML ~~LOC~~ SOLN
40.0000 mg | SUBCUTANEOUS | Status: DC
  Administered 2017-10-12: 40 mg via SUBCUTANEOUS
  Filled 2017-10-11: qty 0.4

## 2017-10-11 MED ORDER — SODIUM CHLORIDE 0.9 % WEIGHT BASED INFUSION: 1.0000 mL/kg/h | INTRAVENOUS | Status: AC

## 2017-10-11 MED ORDER — MIDAZOLAM HCL 2 MG/2ML IJ SOLN
INTRAMUSCULAR | Status: AC
  Filled 2017-10-11: qty 2

## 2017-10-11 MED ORDER — HEPARIN BOLUS VIA INFUSION
2000.0000 [IU] | Freq: Once | INTRAVENOUS | Status: AC
  Administered 2017-10-11: 2000 [IU] via INTRAVENOUS
  Filled 2017-10-11: qty 2000

## 2017-10-11 MED ORDER — HEPARIN (PORCINE) IN NACL 2-0.9 UNITS/ML
INTRAMUSCULAR | Status: AC | PRN
  Administered 2017-10-11 (×2): 500 mL

## 2017-10-11 MED ORDER — SODIUM CHLORIDE 0.9 % IV SOLN: 250.0000 mL | INTRAVENOUS | Status: DC | PRN

## 2017-10-11 MED ORDER — HEPARIN (PORCINE) IN NACL 100-0.45 UNIT/ML-% IJ SOLN
700.0000 [IU]/h | INTRAMUSCULAR | Status: DC
  Administered 2017-10-11: 700 [IU]/h via INTRAVENOUS
  Filled 2017-10-11: qty 250

## 2017-10-11 MED ORDER — SODIUM CHLORIDE 0.9% FLUSH
3.0000 mL | Freq: Two times a day (BID) | INTRAVENOUS | Status: DC
  Administered 2017-10-12 (×2): 3 mL via INTRAVENOUS

## 2017-10-11 MED ORDER — NITROGLYCERIN 2 % TD OINT: 0.5000 [in_us] | TOPICAL_OINTMENT | Freq: Four times a day (QID) | TRANSDERMAL | Status: DC

## 2017-10-11 MED ORDER — ALPRAZOLAM 0.25 MG PO TABS: 0.2500 mg | ORAL_TABLET | Freq: Two times a day (BID) | ORAL | Status: DC | PRN

## 2017-10-11 MED ORDER — ASPIRIN 81 MG PO CHEW
81.0000 mg | CHEWABLE_TABLET | Freq: Every day | ORAL | Status: DC
  Administered 2017-10-12: 81 mg via ORAL
  Filled 2017-10-11: qty 1

## 2017-10-11 MED ORDER — IBUPROFEN 600 MG PO TABS: 600.0000 mg | ORAL_TABLET | Freq: Four times a day (QID) | ORAL | Status: DC | PRN

## 2017-10-11 MED ORDER — ANASTROZOLE 1 MG PO TABS
1.0000 mg | ORAL_TABLET | Freq: Every day | ORAL | Status: DC
  Administered 2017-10-12: 1 mg via ORAL
  Filled 2017-10-11: qty 1

## 2017-10-11 MED ORDER — ESCITALOPRAM OXALATE 10 MG PO TABS
30.0000 mg | ORAL_TABLET | Freq: Every day | ORAL | Status: DC
  Administered 2017-10-12: 30 mg via ORAL
  Filled 2017-10-11: qty 3

## 2017-10-11 MED ORDER — SODIUM CHLORIDE 0.9% FLUSH: 3.0000 mL | INTRAVENOUS | Status: DC | PRN

## 2017-10-11 MED ORDER — MIDAZOLAM HCL 2 MG/2ML IJ SOLN
INTRAMUSCULAR | Status: DC | PRN
  Administered 2017-10-11 (×2): 1 mg via INTRAVENOUS

## 2017-10-11 MED ORDER — CARVEDILOL 12.5 MG PO TABS
12.5000 mg | ORAL_TABLET | Freq: Two times a day (BID) | ORAL | Status: DC
  Administered 2017-10-11: 12.5 mg via ORAL
  Filled 2017-10-11: qty 1

## 2017-10-11 MED ORDER — ACETAMINOPHEN 325 MG PO TABS
650.0000 mg | ORAL_TABLET | ORAL | Status: DC | PRN
  Administered 2017-10-11: 650 mg via ORAL
  Filled 2017-10-11: qty 2

## 2017-10-11 MED ORDER — NITROGLYCERIN 0.4 MG SL SUBL: 0.4000 mg | SUBLINGUAL_TABLET | SUBLINGUAL | Status: DC | PRN

## 2017-10-11 MED ORDER — LIDOCAINE HCL (PF) 1 % IJ SOLN
INTRAMUSCULAR | Status: AC
  Filled 2017-10-11: qty 30

## 2017-10-11 MED ORDER — SODIUM CHLORIDE 0.9 % IV BOLUS
500.0000 mL | Freq: Once | INTRAVENOUS | Status: AC
  Administered 2017-10-11: 500 mL via INTRAVENOUS

## 2017-10-11 MED ORDER — HYDRALAZINE HCL 20 MG/ML IJ SOLN
INTRAMUSCULAR | Status: DC | PRN
  Administered 2017-10-11: 10 mg via INTRAVENOUS

## 2017-10-11 SURGICAL SUPPLY — 10 items
CATH INFINITI 5FR MULTPACK ANG (CATHETERS) ×2 IMPLANT
COVER PRB 48X5XTLSCP FOLD TPE (BAG) ×1 IMPLANT
COVER PROBE 5X48 (BAG) ×1
KIT HEART LEFT (KITS) ×2 IMPLANT
PACK CARDIAC CATHETERIZATION (CUSTOM PROCEDURE TRAY) ×2 IMPLANT
SHEATH PINNACLE 5F 10CM (SHEATH) ×2 IMPLANT
SYR MEDRAD MARK V 150ML (SYRINGE) ×2 IMPLANT
TRANSDUCER W/STOPCOCK (MISCELLANEOUS) ×2 IMPLANT
TUBING CIL FLEX 10 FLL-RA (TUBING) IMPLANT
WIRE EMERALD 3MM-J .035X150CM (WIRE) ×2 IMPLANT

## 2017-10-11 NOTE — Progress Notes (Signed)
B/p noted low, systolic  46'I -47'V, denies any discomforts. R groin site level 0, V Bhagat PA  notified , reviewed meds, NS  250 cc IV bolus ordered,and initiated. Monitored patient , endorsed to incoming RN.

## 2017-10-11 NOTE — ED Triage Notes (Signed)
Per EMS pt come to [place] from her cardiologist  with central chest pain 3/10 x 3weeks and SOB.  En route she received 4 baby Asprin and 1 nitro.  She does have a history of a cardiac cath x10 years ago.  Currently denies pain.  NAD noted at this time.

## 2017-10-11 NOTE — Assessment & Plan Note (Signed)
History of essential hypertension blood pressure is 124/54. She is on no antihypertensive medications

## 2017-10-11 NOTE — Assessment & Plan Note (Signed)
History of CAD status post remote right Revision by Dr. Lia Foyer with no intervention performed. Brittany Kirk is complaining of progressive chest pain, shortness of breath. Brittany Kirk also complains of left arm numbness. The pain radiates to her back. Brittany Kirk is obviously uncomfortable today but her EKG shows no acute changes. I am going to transport her to [facility] by EMS. Brittany Kirk would benefit from having cardiac catheterization performed today to define her anatomy.

## 2017-10-11 NOTE — ED Provider Notes (Signed)
[facility] EMERGENCY DEPARTMENT Provider Note   CSN: [id number] Arrival date & time: 10/11/17  1158     History   Chief Complaint Chief Complaint  Patient presents with  . Chest Pain  . Shortness of Breath    HPI Brittany Kirk is a 72 y.o. female.  HPI Patient presents to the emergency room for to be admitted to the hospital for further evaluation of chest pain.  Patient has a known history of coronary artery disease as well as tobacco use.  Patient does not have any history of myocardial infarction.  Patient has had trouble with increasing shortness of breath and substernal chest pain rating to her back and left upper extremity.  Patient was seen in the cardiology office by Dr Gwenlyn Found.  Patient was sent by EMS to the emergency room to be admitted to the hospital for cardiac catheterization and further evaluation of her chest pain. Past Medical History:  Diagnosis Date  . Allergic rhinitis   . Anxiety   . Asymptomatic bilateral carotid artery stenosis   . CAD (coronary artery disease)   . Cancer ([place])   . Complication of anesthesia    sore throat after a surgery  . Depression   . Emphysema    no longer uses inhalers  . Gallstones    nausea, pain upper right abdomen  . GERD (gastroesophageal reflux disease)   . H/O hiatal hernia   . Headache   . History of skin cancer   . Hyperlipidemia   . Hypertension   . Shortness of breath    sob if walks up flight of stairs  . Tobacco user   . Wears dentures    top    Patient Active Problem List   Diagnosis Date Noted  . Caloric malnutrition ([place]) 08/16/2017  . Weight loss, unintentional 03/12/2017  . GERD (gastroesophageal reflux disease) 08/10/2016  . Cough 08/10/2016  . Acute bronchitis 07/12/2016  . Bilateral carotid artery disease ([place]) 02/14/2016  . Chest pain 01/13/2016  . Osteoporosis 02/22/2015  . CKD (chronic kidney disease), stage III ([place]) 08/16/2014  . Malignant neoplasm of upper-outer  quadrant of right breast in female, estrogen receptor positive ([place]) 03/22/2014  . Dizziness and giddiness 11/27/2012  . Essential tremor 11/27/2012  . Numbness 11/27/2012  . TOBACCO USER 10/04/2009  . CLOSED FRACTURE OF ONE RIB 07/12/2009  . HYPERLIPIDEMIA TYPE IIB / III 09/28/2008  . CAD, NATIVE VESSEL 09/28/2008  . Rhinitis 09/09/2008  . COPD (chronic obstructive pulmonary disease) ([place]) 09/07/2008  . Essential hypertension 09/06/2008  . SKIN CANCER, HX OF 09/06/2008    Past Surgical History:  Procedure Laterality Date  . BREAST LUMPECTOMY Right 2015  . CHOLECYSTECTOMY  07/08/2012   Procedure: LAPAROSCOPIC CHOLECYSTECTOMY WITH INTRAOPERATIVE CHOLANGIOGRAM;  Surgeon: Gayland Curry, MD,FACS;  Location: [facility];  Service: General;  Laterality: N/A;  Laparoscopic Cholecystectomy with Intraoperative Cholangiogram  . COLONOSCOPY    . ESOPHAGOSCOPY N/A 05/29/2017   Procedure: ESOPHAGOSCOPY;  Surgeon: Clarene Essex, MD;  Location: [facility];  Service: Endoscopy;  Laterality: N/A;  botox injection  . HAND SURGERY Left    wrist  . TONSILECTOMY, ADENOIDECTOMY, BILATERAL MYRINGOTOMY AND TUBES    . TUBAL LIGATION       OB History   None      Home Medications    Prior to Admission medications   Medication Sig Start Date End Date Taking? Authorizing Provider  anastrozole (ARIMIDEX) 1 MG tablet Take 1 tablet (1 mg total) by  mouth daily. 01/30/17   Magrinat, Virgie Dad, MD  aspirin 81 MG tablet Take 81 mg by mouth daily.    [provider]  atorvastatin (LIPITOR) 40 MG tablet Take 1 tablet (40 mg total) by mouth at bedtime. 05/07/17   Susy Frizzle, MD  Cyanocobalamin (B-12) 2500 MCG TABS Take 2,500 mcg by mouth daily.    [provider]  diazepam (VALIUM) 5 MG tablet Take 5 mg by mouth at bedtime 07/19/15   [provider]  escitalopram (LEXAPRO) 20 MG tablet Take 20 mg by mouth daily at 12 noon.     [provider]  ibuprofen (ADVIL,MOTRIN) 200 MG  tablet Take 400 mg by mouth daily as needed for headache or moderate pain.    [provider]  Multiple Vitamin (MULTIVITAMIN) tablet Take 1 tablet by mouth daily.    [provider]  omeprazole (PRILOSEC) 40 MG capsule Take 40 mg by mouth daily at 3 pm.    [provider]  ondansetron (ZOFRAN) 4 MG tablet Take 4 mg by mouth every 8 (eight) hours as needed for nausea/vomiting. 03/13/17   [provider]    Family History Family History  Problem Relation Age of Onset  . Arthritis Mother   . Heart disease Unknown        5 brothers and 2 sister  . Diverticulitis Sister   . Aplastic anemia Other     Social History Social History   Tobacco Use  . Smoking status: Former Smoker    Packs/day: 0.50    Years: 40.00    Pack years: 20.00    Types: Cigarettes    Last attempt to quit: 02/13/2011    Years since quitting: 6.6  . Smokeless tobacco: Never Used  Substance Use Topics  . Alcohol use: No  . Drug use: No     Allergies   Penicillins; Cefaclor; Latex; Metronidazole; Naproxen; Nsaids; Septra [bactrim]; Sulfonamide derivatives; and Tape   Review of Systems Review of Systems  All other systems reviewed and are negative.    Physical Exam Updated Vital Signs BP (!) 163/54   Pulse 64   Resp (!) 22   Ht 1.549 m (5\' 1" )   Wt 52.2 kg (115 lb)   SpO2 100%   BMI 21.73 kg/m   Physical Exam  Constitutional: She appears well-developed and well-nourished. No distress.  HENT:  Head: Normocephalic and atraumatic.  Right Ear: External ear normal.  Left Ear: External ear normal.  Eyes: Conjunctivae are normal. Right eye exhibits no discharge. Left eye exhibits no discharge. No scleral icterus.  Neck: Neck supple. No tracheal deviation present.  Cardiovascular: Normal rate, regular rhythm and intact distal pulses.  Pulmonary/Chest: Effort normal and breath sounds normal. No stridor. No respiratory distress. She has no wheezes. She has no rales.    Abdominal: Soft. Bowel sounds are normal. She exhibits no distension. There is no tenderness. There is no rebound and no guarding.  Musculoskeletal: She exhibits no edema or tenderness.  Neurological: She is alert. She has normal strength. No cranial nerve deficit (no facial droop, extraocular movements intact, no slurred speech) or sensory deficit. She exhibits normal muscle tone. She displays no seizure activity. Coordination normal.  Skin: Skin is warm and dry. No rash noted.  Psychiatric: She has a normal mood and affect.  Nursing note and vitals reviewed.    ED Treatments / Results  Labs (all labs ordered are listed, but only abnormal results are displayed) Labs Reviewed  COMPREHENSIVE METABOLIC PANEL - Abnormal; Notable for the following components:      Result Value   Creatinine, Ser 1.02 (*)    Total Protein 6.3 (*)    ALT 13 (*)    GFR calc non Af Amer 53 (*)    All other components within normal limits  BASIC METABOLIC PANEL - Abnormal; Notable for the following components:   Calcium 8.2 (*)    All other components within normal limits  TSH  TROPONIN I  TROPONIN I  TROPONIN I  CBC WITH DIFFERENTIAL/PLATELET  LIPID PANEL  I-STAT TROPONIN, ED  POCT ACTIVATED CLOTTING TIME    EKG EKG Interpretation  Date/Time:  Friday Oct 11 2017 12:14:06 EDT Ventricular Rate:  64 PR Interval:    QRS Duration: 100 QT Interval:  448 QTC Calculation: 463 R Axis:   46 Text Interpretation:  Sinus rhythm Probable left atrial enlargement Low voltage, precordial leads Consider anterior infarct No significant change since last tracing Confirmed by Dorie Rank [phone]) on 10/11/2017 12:17:27 PM   Radiology No results found.  Procedures Procedures (including critical care time)  Medications Ordered in ED Medications  0.9% sodium chloride infusion (1 mL/kg/hr  52.2 kg Intravenous Transfusing/Transfer 10/11/17 1740)  heparin bolus via infusion 2,000 Units (2,000 Units Intravenous Bolus  from Bag 10/11/17 1332)  heparin infusion 2 units/mL in 0.9 % sodium chloride (500 mLs Other New Bag/Given 10/11/17 1554)  0.9 %  sodium chloride infusion ( Intravenous New Bag/Given 10/11/17 1950)  sodium chloride 0.9 % bolus 500 mL (500 mLs Intravenous New Bag/Given 10/11/17 2105)     Initial Impression / Assessment and Plan / ED Course  I have reviewed the triage vital signs and the nursing notes.  Pertinent labs & imaging results that were available during my care of the patient were reviewed by me and considered in my medical decision making (see chart for details).  Clinical Course as of Oct 11 1236  Fri Oct 11, 2017  1235 Office note from today reviewed   [JK]  1235 D/w Trish.  Cardiology team will come and evaluate.   [JK]    Clinical Course User Index [JK] Dorie Rank, MD  Patient has a history of coronary artery disease but no prior history of coronary artery bypass grafting or vascular intervention.  Patient was seen by her cardiologist today for symptoms concerning for unstable angina.  Patient was sent to the emergency room for admission.  Laboratory tests are pending.  EKG does not show any acute ischemic changes.  I will consult with the cardiology team regarding admission and further treatment. Final Clinical Impressions(s) / ED Diagnoses   Final diagnoses:  Unstable angina [facility])      Dorie Rank, MD 10/14/17 [phone]

## 2017-10-11 NOTE — Assessment & Plan Note (Signed)
History of hyperlipidemia on statin therapy with lipid profile performed 02/27/17 revealing LDL 49 and HDL of 58.

## 2017-10-11 NOTE — Interval H&P Note (Signed)
History and Physical Interval Note:  10/11/2017 3:34 PM  Colin Benton Ambrosini  has presented today for surgery, with the diagnosis of cp  The various methods of treatment have been discussed with the patient and family. After consideration of risks, benefits and other options for treatment, the patient has consented to  Procedure(s): LEFT HEART CATH AND CORONARY ANGIOGRAPHY (N/A) as a surgical intervention .  The patient's history has been reviewed, patient examined, no change in status, stable for surgery.  I have reviewed the patient's chart and labs.  Questions were answered to the patient's satisfaction.   Cath Lab Visit (complete for each Cath Lab visit)  Clinical Evaluation Leading to the Procedure:   ACS: Yes.    Non-ACS:    Anginal Classification: CCS III  Anti-ischemic medical therapy: No Therapy  Non-Invasive Test Results: No non-invasive testing performed  Prior CABG: No previous CABG        [facility] 10/11/2017 3:34 PM

## 2017-10-11 NOTE — Assessment & Plan Note (Signed)
History of carotid artery disease with Dopplers performed 02/13/17 revealing moderate to moderately severe bilateral ICA stenosis. This is being followed annually by duplex ultrasound.

## 2017-10-11 NOTE — H&P (View-Only) (Signed)
10/11/2017 Brittany Kirk   [birth year]  [id number]  Primary Physician Pickard, Cammie Mcgee, MD Primary Cardiologist: Lorretta Harp MD Brittany Kirk, [place]  HPI:  Brittany Kirk is a 72 y.o.  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 She was referred by Dr. Dennard Schaumann for cardiovascular evaluation because of chest pain. I last saw her in the office 02/19/17. Risk factors include 25 pack years of tobacco abuse having quit 3 years ago. She has treated hypertension as well as a strong family history of heart disease with multiple siblings who died at a premature age of myocardial infarction's. She has never had a heart attack or stroke. She has had a remote cardiac catheterization by Dr. Lia Foyer but no intervention was performed. She was complaining of some substernal chest pain with chronic nausea. This has since some sided after beginning on a proton pump inhibitor. She had a negative stress test 01/24/16 and again during a recent consultation for chest pain 01/05/17. She has had upper and lower endoscopy for abdominal pain and nausea which was unrevealing. In the office she complains of increasing shortness of breath, substernal chest pain rating to her back and left upper extremity. Her EKG shows no acute changes. She clearly appears uncomfortable today.     Current Meds  Medication Sig  . anastrozole (ARIMIDEX) 1 MG tablet Take 1 tablet (1 mg total) by mouth daily.  Marland Kitchen aspirin 81 MG tablet Take 81 mg by mouth daily.  Marland Kitchen atorvastatin (LIPITOR) 40 MG tablet Take 1 tablet (40 mg total) by mouth at bedtime.  . Cyanocobalamin (B-12) 2500 MCG TABS Take 2,500 mcg by mouth daily.  . diazepam (VALIUM) 5 MG tablet Take 5 mg by mouth at bedtime  . escitalopram (LEXAPRO) 20 MG tablet Take 20 mg by mouth daily at 12 noon.   Marland Kitchen ibuprofen (ADVIL,MOTRIN) 200 MG tablet Take 400 mg by mouth daily as  needed for headache or moderate pain.  . Multiple Vitamin (MULTIVITAMIN) tablet Take 1 tablet by mouth daily.  Marland Kitchen omeprazole (PRILOSEC) 40 MG capsule Take 40 mg by mouth daily at 3 pm.  . ondansetron (ZOFRAN) 4 MG tablet Take 4 mg by mouth every 8 (eight) hours as needed for nausea/vomiting.     Allergies  Allergen Reactions  . Penicillins Anaphylaxis, Itching, Swelling and Rash    Has patient had a PCN reaction causing immediate rash, facial/tongue/throat swelling, SOB or lightheadedness with hypotension: Yes Has patient had a PCN reaction causing severe rash involving mucus membranes or skin necrosis: No Has patient had a PCN reaction that required hospitalization: Yes Has patient had a PCN reaction occurring within the last 10 years: No If all of the above answers are "NO", then may proceed with Cephalosporin use.   . Cefaclor Itching, Swelling and Rash  . Latex Itching and Rash  . Metronidazole Itching, Swelling and Rash  . Naproxen Itching, Swelling and Rash  . Nsaids Itching, Swelling and Rash    Ibuprofen IS tolerated  . Septra [Bactrim] Itching, Swelling and Rash  . Sulfonamide Derivatives Itching, Swelling and Rash  . Tape Itching and Rash    Social History   Socioeconomic History  . Marital status: Married    Spouse name: Not on file  . Number of children: 1  . Years of education: Not on file  . Highest education level: Not on file  Occupational History  .  Occupation: retired Clinical cytogeneticist: RETIRED  Social Needs  . Financial resource strain: Not on file  . Food insecurity:    Worry: Not on file    Inability: Not on file  . Transportation needs:    Medical: Not on file    Non-medical: Not on file  Tobacco Use  . Smoking status: Former Smoker    Packs/day: 0.50    Years: 40.00    Pack years: 20.00    Types: Cigarettes    Last attempt to quit: 02/13/2011    Years since quitting: 6.6  . Smokeless tobacco: Never Used  Substance and Sexual  Activity  . Alcohol use: No  . Drug use: No  . Sexual activity: Not on file  Lifestyle  . Physical activity:    Days per week: Not on file    Minutes per session: Not on file  . Stress: Not on file  Relationships  . Social connections:    Talks on phone: Not on file    Gets together: Not on file    Attends religious service: Not on file    Active member of club or organization: Not on file    Attends meetings of clubs or organizations: Not on file    Relationship status: Not on file  . Intimate partner violence:    Fear of current or ex partner: Not on file    Emotionally abused: Not on file    Physically abused: Not on file    Forced sexual activity: Not on file  Other Topics Concern  . Not on file  Social History Narrative  . Not on file     Review of Systems: General: negative for chills, fever, night sweats or weight changes.  Cardiovascular: negative for chest pain, dyspnea on exertion, edema, orthopnea, palpitations, paroxysmal nocturnal dyspnea or shortness of breath Dermatological: negative for rash Respiratory: negative for cough or wheezing Urologic: negative for hematuria Abdominal: negative for nausea, vomiting, diarrhea, bright red blood per rectum, melena, or hematemesis Neurologic: negative for visual changes, syncope, or dizziness All other systems reviewed and are otherwise negative except as noted above.    Blood pressure (!) 124/54, pulse 60, height 5\' 1"  (1.549 m), weight 115 lb (52.2 kg).  General appearance: alert and no distress Neck: no adenopathy, no JVD, supple, symmetrical, trachea midline, thyroid not enlarged, symmetric, no tenderness/mass/nodules and bilateral carotid bruits Lungs: clear to auscultation bilaterally Heart: regular rate and rhythm, S1, S2 normal, no murmur, click, rub or gallop Extremities: edema no edema  EKG sinus rhythm at 80 without ST or T-wave changes. I personally reviewed this EKG  ASSESSMENT AND PLAN:    HYPERLIPIDEMIA TYPE IIB / III History of hyperlipidemia on statin therapy with lipid profile performed 02/27/17 revealing LDL 49 and HDL of 58.  Essential hypertension History of essential hypertension blood pressure is 124/54. She is on no antihypertensive medications  CAD, NATIVE VESSEL History of CAD status post remote right Revision by Dr. Lia Foyer with no intervention performed. She is complaining of progressive chest pain, shortness of breath. She also complains of left arm numbness. The pain radiates to her back. She is obviously uncomfortable today but her EKG shows no acute changes. I am going to transport her to [facility] by EMS. She would benefit from having cardiac catheterization performed today to define her anatomy.  Bilateral carotid artery disease ([facility]) History of carotid artery disease with Dopplers performed 02/13/17 revealing moderate to moderately severe bilateral  ICA stenosis. This is being followed annually by duplex ultrasound.      Lorretta Harp MD FACP,FACC,FAHA, [facility] 10/11/2017 11:23 AM

## 2017-10-11 NOTE — Progress Notes (Signed)
Site area: rt groin fa sheath Site Prior to Removal:  Level 0 Pressure Applied For: 20 minutes Manual:   yes Patient Status During Pull:  stable Post Pull Site:  Level 0 Post Pull Instructions Given:  yes Post Pull Pulses Present: palpable Dressing Applied:  Gauze and tegaderm Bedrest begins @ [id number] Comments:

## 2017-10-11 NOTE — Progress Notes (Signed)
10/11/2017 Colin Benton Calvi   September 22, 1945  [id number]  Primary Physician Pickard, Cammie Mcgee, MD Primary Cardiologist: Lorretta Harp MD [name], [place]  HPI:  Brittany Kirk is a 72 y.o.  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 She was referred by Dr. Dennard Schaumann for cardiovascular evaluation because of chest pain. I last saw her in the office 02/19/17. Risk factors include 25 pack years of tobacco abuse having quit 3 years ago. She has treated hypertension as well as a strong family history of heart disease with multiple siblings who died at a premature age of myocardial infarction's. She has never had a heart attack or stroke. She has had a remote cardiac catheterization by Dr. Lia Foyer but no intervention was performed. She was complaining of some substernal chest pain with chronic nausea. This has since some sided after beginning on a proton pump inhibitor. She had a negative stress test 01/24/16 and again during a recent consultation for chest pain 01/05/17. She has had upper and lower endoscopy for abdominal pain and nausea which was unrevealing. In the office she complains of increasing shortness of breath, substernal chest pain rating to her back and left upper extremity. Her EKG shows no acute changes. She clearly appears uncomfortable today.     Current Meds  Medication Sig  . anastrozole (ARIMIDEX) 1 MG tablet Take 1 tablet (1 mg total) by mouth daily.  Marland Kitchen aspirin 81 MG tablet Take 81 mg by mouth daily.  Marland Kitchen atorvastatin (LIPITOR) 40 MG tablet Take 1 tablet (40 mg total) by mouth at bedtime.  . Cyanocobalamin (B-12) 2500 MCG TABS Take 2,500 mcg by mouth daily.  . diazepam (VALIUM) 5 MG tablet Take 5 mg by mouth at bedtime  . escitalopram (LEXAPRO) 20 MG tablet Take 20 mg by mouth daily at 12 noon.   Marland Kitchen ibuprofen (ADVIL,MOTRIN) 200 MG tablet Take 400 mg by mouth daily as  needed for headache or moderate pain.  . Multiple Vitamin (MULTIVITAMIN) tablet Take 1 tablet by mouth daily.  Marland Kitchen omeprazole (PRILOSEC) 40 MG capsule Take 40 mg by mouth daily at 3 pm.  . ondansetron (ZOFRAN) 4 MG tablet Take 4 mg by mouth every 8 (eight) hours as needed for nausea/vomiting.     Allergies  Allergen Reactions  . Penicillins Anaphylaxis, Itching, Swelling and Rash    Has patient had a PCN reaction causing immediate rash, facial/tongue/throat swelling, SOB or lightheadedness with hypotension: Yes Has patient had a PCN reaction causing severe rash involving mucus membranes or skin necrosis: No Has patient had a PCN reaction that required hospitalization: Yes Has patient had a PCN reaction occurring within the last 10 years: No If all of the above answers are "NO", then may proceed with Cephalosporin use.   . Cefaclor Itching, Swelling and Rash  . Latex Itching and Rash  . Metronidazole Itching, Swelling and Rash  . Naproxen Itching, Swelling and Rash  . Nsaids Itching, Swelling and Rash    Ibuprofen IS tolerated  . Septra [Bactrim] Itching, Swelling and Rash  . Sulfonamide Derivatives Itching, Swelling and Rash  . Tape Itching and Rash    Social History   Socioeconomic History  . Marital status: Married    Spouse name: Not on file  . Number of children: 1  . Years of education: Not on file  . Highest education level: Not on file  Occupational History  .  Occupation: retired Clinical cytogeneticist: RETIRED  Social Needs  . Financial resource strain: Not on file  . Food insecurity:    Worry: Not on file    Inability: Not on file  . Transportation needs:    Medical: Not on file    Non-medical: Not on file  Tobacco Use  . Smoking status: Former Smoker    Packs/day: 0.50    Years: 40.00    Pack years: 20.00    Types: Cigarettes    Last attempt to quit: 02/13/2011    Years since quitting: 6.6  . Smokeless tobacco: Never Used  Substance and Sexual  Activity  . Alcohol use: No  . Drug use: No  . Sexual activity: Not on file  Lifestyle  . Physical activity:    Days per week: Not on file    Minutes per session: Not on file  . Stress: Not on file  Relationships  . Social connections:    Talks on phone: Not on file    Gets together: Not on file    Attends religious service: Not on file    Active member of club or organization: Not on file    Attends meetings of clubs or organizations: Not on file    Relationship status: Not on file  . Intimate partner violence:    Fear of current or ex partner: Not on file    Emotionally abused: Not on file    Physically abused: Not on file    Forced sexual activity: Not on file  Other Topics Concern  . Not on file  Social History Narrative  . Not on file     Review of Systems: General: negative for chills, fever, night sweats or weight changes.  Cardiovascular: negative for chest pain, dyspnea on exertion, edema, orthopnea, palpitations, paroxysmal nocturnal dyspnea or shortness of breath Dermatological: negative for rash Respiratory: negative for cough or wheezing Urologic: negative for hematuria Abdominal: negative for nausea, vomiting, diarrhea, bright red blood per rectum, melena, or hematemesis Neurologic: negative for visual changes, syncope, or dizziness All other systems reviewed and are otherwise negative except as noted above.    Blood pressure (!) 124/54, pulse 60, height 5\' 1"  (1.549 m), weight 115 lb (52.2 kg).  General appearance: alert and no distress Neck: no adenopathy, no JVD, supple, symmetrical, trachea midline, thyroid not enlarged, symmetric, no tenderness/mass/nodules and bilateral carotid bruits Lungs: clear to auscultation bilaterally Heart: regular rate and rhythm, S1, S2 normal, no murmur, click, rub or gallop Extremities: edema no edema  EKG sinus rhythm at 80 without ST or T-wave changes. I personally reviewed this EKG  ASSESSMENT AND PLAN:    HYPERLIPIDEMIA TYPE IIB / III History of hyperlipidemia on statin therapy with lipid profile performed 02/27/17 revealing LDL 49 and HDL of 58.  Essential hypertension History of essential hypertension blood pressure is 124/54. She is on no antihypertensive medications  CAD, NATIVE VESSEL History of CAD status post remote right Revision by Dr. Lia Foyer with no intervention performed. She is complaining of progressive chest pain, shortness of breath. She also complains of left arm numbness. The pain radiates to her back. She is obviously uncomfortable today but her EKG shows no acute changes. I am going to transport her to [facility] by EMS. She would benefit from having cardiac catheterization performed today to define her anatomy.  Bilateral carotid artery disease (HCC) History of carotid artery disease with Dopplers performed 02/13/17 revealing moderate to moderately severe bilateral  ICA stenosis. This is being followed annually by duplex ultrasound.      Lorretta Harp MD FACP,FACC,FAHA, [facility] 10/11/2017 11:23 AM

## 2017-10-11 NOTE — Progress Notes (Signed)
ANTICOAGULATION CONSULT NOTE - Initial Consult  Pharmacy Consult for heparin Indication: chest pain/ACS  Allergies  Allergen Reactions  . Penicillins Anaphylaxis, Itching, Swelling and Rash    Has patient had a PCN reaction causing immediate rash, facial/tongue/throat swelling, SOB or lightheadedness with hypotension: Yes Has patient had a PCN reaction causing severe rash involving mucus membranes or skin necrosis: No Has patient had a PCN reaction that required hospitalization: Yes Has patient had a PCN reaction occurring within the last 10 years: No If all of the above answers are "NO", then may proceed with Cephalosporin use.   . Cefaclor Itching, Swelling and Rash  . Latex Itching and Rash  . Metronidazole Itching, Swelling and Rash  . Naproxen Itching, Swelling and Rash  . Nsaids Itching, Swelling and Rash    Ibuprofen IS tolerated  . Septra [Bactrim] Itching, Swelling and Rash  . Sulfonamide Derivatives Itching, Swelling and Rash  . Tape Itching and Rash    Patient Measurements: Height: 5\' 1"  (154.9 cm) Weight: 115 lb (52.2 kg) IBW/kg (Calculated) : 47.8 Heparin Dosing Weight: 52kg  Vital Signs: BP: 163/54 (05/03 1215) Pulse Rate: 64 (05/03 1215)  Labs: No results for input(s): HGB, HCT, PLT, APTT, LABPROT, INR, HEPARINUNFRC, HEPRLOWMOCWT, CREATININE, CKTOTAL, CKMB, TROPONINI in the last 72 hours.  CrCl cannot be calculated (Patient's most recent lab result is older than the maximum 21 days allowed.).   Assessment: 76 YOF came from cardiologist with central chest pain x3 weeks and SOB. No acute changes on EKG in the office per notes, troponin negative. Baseline CBC not back yet, though noted it was normal in February.  Goal of Therapy:  Heparin level 0.3-0.7 units/ml Monitor platelets by anticoagulation protocol: Yes   Plan:  Heparin bolus with 2000 units IV x1, then start infusion at 700 units/hr Heparin level in 8h vs f/u cath Follow cardiology  plans  Albirda Shiel D. Krisandra Bueno, PharmD, BCPS Clinical Pharmacist Clinical Phone for 10/11/2017 until 3:30pm: [phone] If after 3:30pm, please call main pharmacy at [phone] 10/11/2017 1:14 PM

## 2017-10-11 NOTE — Progress Notes (Signed)
The patient understands that risks included but are not limited to stroke (1 in 1000), death (1 in 60), kidney failure [usually temporary] (1 in 500), bleeding (1 in 200), allergic reaction [possibly serious] (1 in 200).  The patient understands and agrees to proceed.   She has lymphedema in her Rt arm, prior lumpectomy.  She has a good Rt radlal artery pulse.  Kerin Ransom PA-C 10/11/2017 1:06 PM

## 2017-10-12 ENCOUNTER — Other Ambulatory Visit: Payer: Self-pay

## 2017-10-12 DIAGNOSIS — I25110 Atherosclerotic heart disease of native coronary artery with unstable angina pectoris: Secondary | ICD-10-CM | POA: Diagnosis not present

## 2017-10-12 DIAGNOSIS — Z880 Allergy status to penicillin: Secondary | ICD-10-CM | POA: Diagnosis not present

## 2017-10-12 DIAGNOSIS — Z85828 Personal history of other malignant neoplasm of skin: Secondary | ICD-10-CM | POA: Diagnosis not present

## 2017-10-12 DIAGNOSIS — Z79811 Long term (current) use of aromatase inhibitors: Secondary | ICD-10-CM | POA: Diagnosis not present

## 2017-10-12 DIAGNOSIS — Z7982 Long term (current) use of aspirin: Secondary | ICD-10-CM | POA: Diagnosis not present

## 2017-10-12 DIAGNOSIS — J449 Chronic obstructive pulmonary disease, unspecified: Secondary | ICD-10-CM | POA: Diagnosis not present

## 2017-10-12 DIAGNOSIS — I25118 Atherosclerotic heart disease of native coronary artery with other forms of angina pectoris: Secondary | ICD-10-CM | POA: Diagnosis not present

## 2017-10-12 DIAGNOSIS — N183 Chronic kidney disease, stage 3 (moderate): Secondary | ICD-10-CM | POA: Diagnosis not present

## 2017-10-12 DIAGNOSIS — Z87891 Personal history of nicotine dependence: Secondary | ICD-10-CM | POA: Diagnosis not present

## 2017-10-12 DIAGNOSIS — Z91040 Latex allergy status: Secondary | ICD-10-CM | POA: Diagnosis not present

## 2017-10-12 DIAGNOSIS — I129 Hypertensive chronic kidney disease with stage 1 through stage 4 chronic kidney disease, or unspecified chronic kidney disease: Secondary | ICD-10-CM | POA: Diagnosis not present

## 2017-10-12 DIAGNOSIS — Z79899 Other long term (current) drug therapy: Secondary | ICD-10-CM | POA: Diagnosis not present

## 2017-10-12 DIAGNOSIS — E785 Hyperlipidemia, unspecified: Secondary | ICD-10-CM | POA: Diagnosis not present

## 2017-10-12 LAB — BASIC METABOLIC PANEL
Anion gap: 9 (ref 5–15)
BUN: 18 mg/dL (ref 6–20)
CO2: 23 mmol/L (ref 22–32)
Calcium: 8.2 mg/dL — ABNORMAL LOW (ref 8.9–10.3)
Chloride: 106 mmol/L (ref 101–111)
Creatinine, Ser: 0.89 mg/dL (ref 0.44–1.00)
GFR calc Af Amer: >60 mL/min (ref >60)
GFR calc non Af Amer: >60 mL/min (ref >60)
Glucose, Bld: 92 mg/dL (ref 65–99)
Potassium: 3.7 mmol/L (ref 3.5–5.1)
Sodium: 138 mmol/L (ref 135–145)

## 2017-10-12 LAB — LIPID PANEL
Cholesterol: 91 mg/dL (ref 0–200)
HDL: 41 mg/dL (ref >40)
LDL Cholesterol: 41 mg/dL (ref 0–99)
Total CHOL/HDL Ratio: 2.2 RATIO
Triglycerides: 47 mg/dL (ref <150)
VLDL: 9 mg/dL (ref 0–40)

## 2017-10-12 LAB — TROPONIN I: Troponin I: <0.03 ng/mL (ref <0.03)

## 2017-10-12 NOTE — Discharge Summary (Addendum)
Discharge Summary    Patient ID: Brittany Kirk  MRN: [id number], DOB/AGE: 74-Sep-1947 74 y.o.  Admit Date: 10/11/2017 Discharge Date: 10/12/2017  Primary Care Provider: Susy Frizzle, MD Primary Cardiologist: Dr. Gwenlyn Found, MD  Discharge Diagnoses    Active Problems:   Unstable angina [facility])   Allergies Allergies  Allergen Reactions  . Penicillins Anaphylaxis, Itching, Swelling and Rash    Has patient had a PCN reaction causing immediate rash, facial/tongue/throat swelling, SOB or lightheadedness with hypotension: Yes Has patient had a PCN reaction causing severe rash involving mucus membranes or skin necrosis: No Has patient had a PCN reaction that required hospitalization: Yes Has patient had a PCN reaction occurring within the last 10 years: No If all of the above answers are "NO", then may proceed with Cephalosporin use.   . Cefaclor Itching, Swelling and Rash  . Latex Itching and Rash  . Metronidazole Itching, Swelling and Rash  . Naproxen Itching, Swelling and Rash  . Nsaids Itching, Swelling and Rash    Ibuprofen IS tolerated  . Septra [Bactrim] Itching, Swelling and Rash  . Sulfonamide Derivatives Itching, Swelling and Rash  . Tape Itching and Rash     History of Present Illness     72 year old female with history of CAD medically managed as detailed below, emphysema, CKD stage III, HTN, HLD, breast cancer s/p lumpectomy, carotid artery disease, prior tobacco abuse for 25 pack years quitting ~ 3 years prior, strong family history of heart disease with multiple siblings who passed away at a premature age from MIs, and GERD who was admitted to [facility] with chest pain on 10/11/2017.   Patient has history of a remote cardiac cath by Dr. Lia Foyer, MD, in 09/2008, that showed moderate calcification in the coronary arteries, high-grade ostial ramus intermedius at trifurcation location involving LAD, ramus and the AV circumflex. Medical therapy was pursued as lesion was not ideal  for percutaneous intervention given his ostial location and it's relationship to the LAD and the left circumflex interface. Most recent ischemic evaluation by [facility] on 01/05/2017 that was negative for ischemia, no EKG changes, EF 83%, low risk scan. Over the fall and winter months of 2018, she had an upper and lower endoscopy that were both unrevealing. She was seen in the office on 5/3 for evaluation of chest pain. At that time, she noted increasing SOB, substernal chest pain that radiated to her back and left upper extremity. Her EKG showed no acute changes. She was transported via EMS to [facility].   Hospital Course     Consultants: none  Upon her arrival to [facility], troponin was cycled and she ruled out. CXR showed a mild opacity of the right base. She underwent LHC on 10/11/2017 that showed single vessel CAD involving the ostium of the ramus intermediate branch. Cardiac cath was unchanged from prior study in 2010. Given prior concern regarding shifting plaque into the LAD or LCx with PCI. Patient had not been on any antianginal therapy and was noted to be severely hypertensive in the cath lab. Aggressive medical therapy and BP control was advised. She was started on Coreg and Imdur. If she continues to have refractory angina despite optimal medical therapy and BP control, PCI of the ramus could be considered. Post cath labs showed stable renal function. In the evening of 5/3, the patient was hypotensive with systolic BP in the 18E to 80s. There were no complications from her cardiac cath site. PM Coreg was held.  She was given IV saline bolus with improvement in BP. Initial BP this morning was soft in the 87F systolic. Coreg was held again. BP 125 mmHg currently. Patient has explained to Dr. Rayann Heman that she only wants to take her prior to admission medications upon discharge and she will not be sent home on any new medications. She has been advised to follow up with Dr. Gwenlyn Found for further medical  management (message has been sent to our office).   The patient's right femoral cardiac cath site has been examined and is healing well without issues at this time. The patient has been seen by Dr. Rayann Heman, MD and felt to be stable for discharge today. All follow up appointments have been made. Discharge medications are listed below. Prescriptions have been reviewed with the patient and sent in to their pharmacy.  _____________  Discharge Vitals Blood pressure (!) 125/48, pulse (!) 54, temperature 98.1 F (36.7 C), temperature source Oral, resp. rate 19, height 5\' 1"  (1.549 m), weight 115 lb 1.3 oz (52.2 kg), SpO2 100 %.  Filed Weights   10/11/17 1216 10/12/17 0500  Weight: 115 lb (52.2 kg) 115 lb 1.3 oz (52.2 kg)    Labs & Radiologic Studies    CBC Recent Labs    10/11/17 1218  WBC 8.1  NEUTROABS 5.1  HGB 13.3  HCT 40.8  MCV 99.8  PLT 643   Basic Metabolic Panel Recent Labs    10/11/17 1218 10/12/17 0505  NA 141 138  K 4.3 3.7  CL 104 106  CO2 22 23  GLUCOSE 94 92  BUN 17 18  CREATININE 1.02* 0.89  CALCIUM 9.1 8.2*   Liver Function Tests Recent Labs    10/11/17 1218  AST 19  ALT 13*  ALKPHOS 56  BILITOT 0.8  PROT 6.3*  ALBUMIN 3.7   No results for input(s): LIPASE, AMYLASE in the last 72 hours. Cardiac Enzymes Recent Labs    10/11/17 1218 10/11/17 1916 10/12/17 0055  TROPONINI <0.03 <0.03 <0.03   BNP Invalid input(s): POCBNP D-Dimer No results for input(s): DDIMER in the last 72 hours. Hemoglobin A1C No results for input(s): HGBA1C in the last 72 hours. Fasting Lipid Panel Recent Labs    10/12/17 0505  CHOL 91  HDL 41  LDLCALC 41  TRIG 47  CHOLHDL 2.2   Thyroid Function Tests Recent Labs    10/11/17 1511  TSH 0.378   _____________  Dg Chest 2 View  Result Date: 10/11/2017 CLINICAL DATA:  Chest pain. EXAM: CHEST - 2 VIEW COMPARISON:  CT chest dated August 13, 2017. Chest x-ray dated January 04, 2017. FINDINGS: The heart size and  mediastinal contours are within normal limits. Normal pulmonary vascularity. Atherosclerotic calcification of the aortic arch. Mild opacity at the right lung base on the frontal view. No focal consolidation, pleural effusion, or pneumothorax. No acute osseous abnormality. Stable mild compression deformities of T3 and T5. Unchanged surgical clips in the right axilla. IMPRESSION: 1. Mild opacity at the right lung base may reflect atelectasis versus developing infiltrate. Electronically Signed   By: Titus Dubin M.D.   On: 10/11/2017 12:47    Diagnostic Studies/Procedures   LHC 10/11/2017: Coronary Findings   Diagnostic  Dominance: Right  Left Main  Vessel was injected. Vessel is normal in caliber. Vessel is angiographically normal.  Left Anterior Descending  Ost LAD lesion 30% stenosed  Ost LAD lesion is 30% stenosed. The lesion is moderately calcified.  Prox LAD lesion 40% stenosed  Prox  LAD lesion is 40% stenosed. The lesion is focal.  Ramus Intermedius  Ost Ramus lesion 85% stenosed  Ost Ramus lesion is 85% stenosed.  Left Circumflex  Vessel was injected. Vessel is normal in caliber. Vessel is angiographically normal.  Right Coronary Artery  Prox RCA to Mid RCA lesion 35% stenosed  Prox RCA to Mid RCA lesion is 35% stenosed.  Intervention   No interventions have been documented.  Wall Motion              Left Heart   Left Ventricle The left ventricular size is normal. The left ventricular systolic function is normal. LV end diastolic pressure is normal. The left ventricular ejection fraction is 55-65% by visual estimate. No regional wall motion abnormalities.  Coronary Diagrams   Diagnostic Diagram         Conclusion     Ost LAD lesion is 30% stenosed.  Prox LAD lesion is 40% stenosed.  Ost Ramus lesion is 85% stenosed.  Prox RCA to Mid RCA lesion is 35% stenosed.  The left ventricular systolic function is normal.  LV end diastolic pressure is  normal.  The left ventricular ejection fraction is 55-65% by visual estimate.   1. Single vessel obstructive CAD involving the ostium of the ramus intermediate branch. 2. Normal LV function 3. Normal LVEDP  Plan: Cardiac cath is unchanged from prior study in 2010. The Ramus branch was untreated at that time due to concern about shifting plaque into the LAD or LCx with PCI. The patient is currently on no antianginal therapy and is severely hypertensive in the lab. I would recommend aggressive medical therapy and BP control. Will add Coreg and long acting nitrate. If she continues to have refractory angina despite optimal medical therapy and BP control, PCI of the ramus could be considered.    _____________  Disposition   Pt is being discharged home today in good condition.  Follow-up Plans & Appointments    Follow-up Information    Lorretta Harp, MD Follow up.   Specialties:  Cardiology, Radiology Why:  A message has been sent to our [facility] office for hospital follow up in 7-14 days.  Contact information: [address] [place] [address] [phone]          Discharge Instructions    Diet - low sodium heart healthy   Complete by:  As directed    Increase activity slowly   Complete by:  As directed       Discharge Medications   Allergies as of 10/12/2017      Reactions   Penicillins Anaphylaxis, Itching, Swelling, Rash   Has patient had a PCN reaction causing immediate rash, facial/tongue/throat swelling, SOB or lightheadedness with hypotension: Yes Has patient had a PCN reaction causing severe rash involving mucus membranes or skin necrosis: No Has patient had a PCN reaction that required hospitalization: Yes Has patient had a PCN reaction occurring within the last 10 years: No If all of the above answers are "NO", then may proceed with Cephalosporin use.   Cefaclor Itching, Swelling, Rash   Latex Itching, Rash   Metronidazole Itching,  Swelling, Rash   Naproxen Itching, Swelling, Rash   Nsaids Itching, Swelling, Rash   Ibuprofen IS tolerated   Septra [bactrim] Itching, Swelling, Rash   Sulfonamide Derivatives Itching, Swelling, Rash   Tape Itching, Rash      Medication List    TAKE these medications   anastrozole 1 MG tablet Commonly known as:  ARIMIDEX Take 1 tablet (1 mg total) by mouth daily.   aspirin 81 MG tablet Take 81 mg by mouth daily.   atorvastatin 40 MG tablet Commonly known as:  LIPITOR Take 1 tablet (40 mg total) by mouth at bedtime.   B-12 2500 MCG Tabs Take 2,500 mcg by mouth daily.   diazepam 5 MG tablet Commonly known as:  VALIUM Take 5 mg by mouth at bedtime   escitalopram 20 MG tablet Commonly known as:  LEXAPRO Take 30 mg by mouth daily at 12 noon. Taking a 10mg  tablet for a total dose of 30mg    ibuprofen 200 MG tablet Commonly known as:  ADVIL,MOTRIN Take 400 mg by mouth daily as needed for headache or moderate pain.   Magnesium 250 MG Tabs Take 250 mg by mouth daily.   multivitamin tablet Take 1 tablet by mouth daily.   omeprazole 40 MG capsule Commonly known as:  PRILOSEC Take 40 mg by mouth daily at 3 pm.   ondansetron 4 MG tablet Commonly known as:  ZOFRAN Take 4 mg by mouth every 8 (eight) hours as needed for nausea/vomiting.       Aspirin prescribed at discharge?  Yes High Intensity Statin Prescribed? (Lipitor 40-80mg  or Crestor 20-40mg ): Yes Beta Blocker Prescribed? Yes For EF <40%, was ACEI/ARB Prescribed? No: EF > 40% ADP Receptor Inhibitor Prescribed? (i.e. Plavix etc.-Includes Medically Managed Patients):  For EF <40%, Aldosterone Inhibitor Prescribed? No: EF > 40% Was EF assessed during THIS hospitalization? Yes Was Cardiac Rehab II ordered? (Included Medically managed Patients): Yes   Outstanding Labs/Studies   none  Duration of Discharge Encounter   Greater than 30 minutes including physician time.  Signed, Rise Mu, PA-C [facility] Pager: [phone] 10/12/2017, 11:53 AM  I have seen, examined the patient, and reviewed the above assessment and plan.  Changes to above are made where necessary.  DC to home.  Co Sign: Thompson Grayer, MD 10/12/2017 4:44 PM

## 2017-10-12 NOTE — Progress Notes (Signed)
Progress Note   Subjective   Doing well today, the patient denies CP or SOB.  No new concerns  Inpatient Medications    Scheduled Meds: . anastrozole  1 mg Oral Daily  . aspirin  81 mg Oral Daily  . atorvastatin  40 mg Oral q1800  . carvedilol  12.5 mg Oral BID WC  . enoxaparin (LOVENOX) injection  40 mg Subcutaneous Q24H  . escitalopram  30 mg Oral Q1200  . isosorbide mononitrate  30 mg Oral Daily  . multivitamin with minerals  1 tablet Oral Daily  . pantoprazole  40 mg Oral Q0600  . sodium chloride flush  3 mL Intravenous Q12H   Continuous Infusions: . sodium chloride     PRN Meds: sodium chloride, acetaminophen, diazepam, ibuprofen, nitroGLYCERIN, ondansetron (ZOFRAN) IV, ondansetron, sodium chloride flush   Vital Signs    Vitals:   10/12/17 0350 10/12/17 0420 10/12/17 0500 10/12/17 0645  BP: (!) 117/45 (!) 110/47  (!) 125/48  Pulse:      Resp:      Temp:      TempSrc:      SpO2:      Weight:   115 lb 1.3 oz (52.2 kg)   Height:        Intake/Output Summary (Last 24 hours) at 10/12/2017 1126 Last data filed at 10/11/2017 2000 Gross per 24 hour  Intake -  Output 150 ml  Net -150 ml   Filed Weights   10/11/17 1216 10/12/17 0500  Weight: 115 lb (52.2 kg) 115 lb 1.3 oz (52.2 kg)    Telemetry    Sinus bradycardia - Personally Reviewed  Physical Exam   GEN- The patient is well appearing, alert and oriented x 3 today.   Head- normocephalic, atraumatic Eyes-  Sclera clear, conjunctiva pink Ears- hearing intact Oropharynx- clear Neck- supple, Lungs- Clear to ausculation bilaterally, normal work of breathing Heart- Regular rate and rhythm  GI- soft, NT, ND, + BS Extremities- no clubbing, cyanosis, or edema  MS- no significant deformity or atrophy Skin- no rash or lesion Psych- euthymic mood, full affect Neuro- strength and sensation are intact   Labs    Chemistry Recent Labs  Lab 10/11/17 1218 10/12/17 0505  NA 141 138  K 4.3 3.7  CL 104 106   CO2 22 23  GLUCOSE 94 92  BUN 17 18  CREATININE 1.02* 0.89  CALCIUM 9.1 8.2*  PROT 6.3*  --   ALBUMIN 3.7  --   AST 19  --   ALT 13*  --   ALKPHOS 56  --   BILITOT 0.8  --   GFRNONAA 53* >60  GFRAA >60 >60  ANIONGAP 15 9     Hematology Recent Labs  Lab 10/11/17 1218  WBC 8.1  RBC 4.09  HGB 13.3  HCT 40.8  MCV 99.8  MCH 32.5  MCHC 32.6  RDW 13.6  PLT 209    Cardiac Enzymes Recent Labs  Lab 10/11/17 1218 10/11/17 1916 10/12/17 0055  TROPONINI <0.03 <0.03 <0.03    Recent Labs  Lab 10/11/17 1235  TROPIPOC 0.00        Assessment & Plan    1.  CAD Cath reviewed Discussed medicine changes with the patient.  She states, "I was given medicine last night which made my blood pressure very low.  I only want to take the medicines I was taking previously at home". This is reasonable.  Resume home medicines at discharge. I would advise close  outpatient follow-up with Dr Gwenlyn Found and his team for further medicine management.    DC to home today.  Thompson Grayer MD, [facility] 10/12/2017 11:26 AM

## 2017-10-14 ENCOUNTER — Encounter (HOSPITAL_COMMUNITY): Payer: Self-pay | Admitting: Cardiology

## 2017-10-15 ENCOUNTER — Telehealth: Payer: Self-pay | Admitting: Cardiovascular Disease

## 2017-10-15 MED FILL — Heparin Sod (Porcine)-NaCl IV Soln 1000 Unit/500ML-0.9%: INTRAVENOUS | Qty: 1000 | Status: AC

## 2017-10-15 NOTE — Telephone Encounter (Signed)
Spoke with pt who states her BP reading has been fluctuating and this morning she woke up with a headache. She also reports feeling dizzy.  Last 5 BP readings are listed in previous encounter. This morning reading was 142/71. Routed to Dr. Gwenlyn Found for recommendation.

## 2017-10-15 NOTE — Telephone Encounter (Signed)
New Message   Pt c/o BP issue: STAT if pt c/o blurred vision, one-sided weakness or slurred speech  1. What are your last 5 BP readings? 91/46, 123/58, 142/71, 110/64, 119/57, 153/74, 106/52, 109/54 113/53  2. Are you having any other symptoms (ex. Dizziness, headache, blurred vision, passed out)? Dizziness   3. What is your BP issue? Pt states that her bp has been all over the place and its worse after dinner. Please call

## 2017-10-17 NOTE — Telephone Encounter (Signed)
Spoke with pt, her bp is running from 128/61, 106/49, 142/71, 91/46, 119/57. She is not taking any bp medications at all. She does report dizziness when standing from sitting and that is typically when her bp will be low. She continues to have a little nausea but no more headache. She will have episodes of dizziness daily. She reports drinking plenty of fluids and avoiding caffeine. Explained to the patient she can eat a little extra salt when her bp is low and she is dizzy to help get her bp up more but cautioned her about too much salt and the affect it would have to elevate the bp. Patient voiced understanding and is aware dr berry is not here but will send to him for his review and advise. Pt agreed with this plan.

## 2017-10-21 NOTE — Telephone Encounter (Signed)
Returned call to patient. Explained RPH recommendations. She is still very concerned about her elevated BP. She states her BP was lower last week b/c she was hypertensive in hospital during cath and they gave her meds which bottomed out her BP. Since patient was not scheduled for post-cath visit, she has been scheduled with MD for 10/22/17 @ 1030 to review cath & BP issues.

## 2017-10-21 NOTE — Telephone Encounter (Signed)
Her blood pressure is slightly elevated but not need for new BP medication noted at this time especially after a period of low BP.  Symptoms of pain, nausea and/or headaches may  cause the BP to go up as well.   Per Med List review:  Her symptoms as very general and may have multiple causes.  Anastrozole (Arimidex) adverse drug reactions may cause some of her current symptoms like levated BP, nausea, and HA.   Escitalopram may be responsible for HA and nausea as well.   Patient should contact prescribed for further assessment and potential medication adjustment.

## 2017-10-21 NOTE — Telephone Encounter (Signed)
Follow up   Patient returning call for nurse

## 2017-10-21 NOTE — Telephone Encounter (Signed)
LM w/spouse to have patient return call

## 2017-10-21 NOTE — Telephone Encounter (Signed)
New Message  Pt c/o BP issue: STAT if pt c/o blurred vision, one-sided weakness or slurred speech  1. What are your last 5 BP readings? 162/63, 161/63, 161/69, 130/56, 143/60  2. Are you having any other symptoms (ex. Dizziness, headache, blurred vision, passed out)? dizziness  3. What is your BP issue? Pt states that her blood pressure is stilling running high. Please call

## 2017-10-21 NOTE — Telephone Encounter (Signed)
Returned call to patient of Dr. Gwenlyn Found who is concerned about elevated BP. She states she gets dizzy when her BP is up with headaches & nausea. She checks her BP 4-5x/daily. I suggested that twice daily is sufficient. She does not take any BP meds.   She called in to triage on 5/9 and spoke with Hilda Blades RN about low BP. She did not eat salt as suggested for low BP.   Her BP readings are noted below. She would like advice on any med changes that should be made.  162/63, 161/63, 161/69, 130/56, 143/60   Will route to MD/CVRR for assistance

## 2017-10-22 ENCOUNTER — Ambulatory Visit (INDEPENDENT_AMBULATORY_CARE_PROVIDER_SITE_OTHER): Payer: Medicare Other | Admitting: Cardiovascular Disease

## 2017-10-22 ENCOUNTER — Encounter: Payer: Self-pay | Admitting: Cardiovascular Disease

## 2017-10-22 DIAGNOSIS — E782 Mixed hyperlipidemia: Secondary | ICD-10-CM | POA: Diagnosis not present

## 2017-10-22 DIAGNOSIS — I10 Essential (primary) hypertension: Secondary | ICD-10-CM | POA: Diagnosis not present

## 2017-10-22 DIAGNOSIS — I6523 Occlusion and stenosis of bilateral carotid arteries: Secondary | ICD-10-CM | POA: Diagnosis not present

## 2017-10-22 DIAGNOSIS — I2510 Atherosclerotic heart disease of native coronary artery without angina pectoris: Secondary | ICD-10-CM | POA: Diagnosis not present

## 2017-10-22 DIAGNOSIS — I200 Unstable angina: Secondary | ICD-10-CM

## 2017-10-22 NOTE — Assessment & Plan Note (Signed)
She of CAD recent heart cath performed by Dr. Martinique urgently on 10/11/2017 revealing normal LV systolic function ostial ramus branch disease otherwise noncritical CAD unchanged from cardiac catheterization performed 10 years ago.  I did not think her pain based on this is vascular in nature.

## 2017-10-22 NOTE — Assessment & Plan Note (Signed)
She of hyperlipidemia on statin therapy with recent lipid profile performed 10/12/2017 revealing total cholesterol 91, LDL 41 and HDL 41.

## 2017-10-22 NOTE — Assessment & Plan Note (Signed)
History of essential hypertension her blood pressure measured today at 116/59 is on no antihypertensive medications.

## 2017-10-22 NOTE — Progress Notes (Signed)
10/22/2017 Brittany Kirk   01/04/1946  [id number]  Primary Physician Pickard, Cammie Mcgee, MD Primary Cardiologist: Lorretta Harp MD [name], [place]  HPI:  Brittany Kirk is a 72 y.o.  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 She was referred by Dr. Dennard Schaumann for cardiovascular evaluation because of chest pain. I last saw her in the office  10/11/2017.Risk factors include 25 pack years of tobacco abuse having quit 3 years ago. She has treated hypertension as well as a strong family history of heart disease with multiple siblings who died at a premature age of myocardial infarction's. She has never had a heart attack or stroke. She has had a remote cardiac catheterization by Dr. Lia Foyer but no intervention was performed. She was complaining of some substernal chest pain with chronic nausea. This has since some sided after beginning on a proton pump inhibitor.She had a negative stress test 01/24/16 and again during a recent consultation for chest pain 01/05/17. She has had upper and lower endoscopy for abdominal pain and nausea which was unrevealing. In the office she complains of increasing shortness of breath, substernal chest pain rating to her back and left upper extremity. Her EKG shows no acute changes. She clearly appeared uncomfortable .  I had her urgently transported to [facility] by EMS for cardiac catheterization which was performed by Dr. Martinique showing moderately severe ramus branch ostial disease with noncritical disease otherwise a normal LV function.  This is unchanged from prior cath performed in 2010 it was thought that her chest pain was noncardiac in nature.     Current Meds  Medication Sig  . anastrozole (ARIMIDEX) 1 MG tablet Take 1 tablet (1 mg total) by mouth daily.  Marland Kitchen aspirin 81 MG tablet Take 81 mg by mouth daily.  Marland Kitchen atorvastatin (LIPITOR) 40  MG tablet Take 1 tablet (40 mg total) by mouth at bedtime.  . Cyanocobalamin (B-12) 2500 MCG TABS Take 2,500 mcg by mouth daily.  . diazepam (VALIUM) 5 MG tablet Take 5 mg by mouth at bedtime  . escitalopram (LEXAPRO) 20 MG tablet Take 30 mg by mouth daily at 12 noon. Taking a 10mg  tablet for a total dose of 30mg   . ibuprofen (ADVIL,MOTRIN) 200 MG tablet Take 400 mg by mouth daily as needed for headache or moderate pain.  . Magnesium 250 MG TABS Take 250 mg by mouth daily.  . Multiple Vitamin (MULTIVITAMIN) tablet Take 1 tablet by mouth daily.  Marland Kitchen omeprazole (PRILOSEC) 40 MG capsule Take 40 mg by mouth daily at 3 pm.  . ondansetron (ZOFRAN) 4 MG tablet Take 4 mg by mouth every 8 (eight) hours as needed for nausea/vomiting.     Allergies  Allergen Reactions  . Penicillins Anaphylaxis, Itching, Swelling and Rash    Has patient had a PCN reaction causing immediate rash, facial/tongue/throat swelling, SOB or lightheadedness with hypotension: Yes Has patient had a PCN reaction causing severe rash involving mucus membranes or skin necrosis: No Has patient had a PCN reaction that required hospitalization: Yes Has patient had a PCN reaction occurring within the last 10 years: No If all of the above answers are "NO", then may proceed with Cephalosporin use.   . Cefaclor Itching, Swelling and Rash  . Latex Itching and Rash  . Metronidazole Itching, Swelling and Rash  . Naproxen Itching, Swelling and Rash  . Nsaids Itching, Swelling  and Rash    Ibuprofen IS tolerated  . Septra [Bactrim] Itching, Swelling and Rash  . Sulfonamide Derivatives Itching, Swelling and Rash  . Tape Itching and Rash    Social History   Socioeconomic History  . Marital status: Married    Spouse name: Not on file  . Number of children: 1  . Years of education: Not on file  . Highest education level: Not on file  Occupational History  . Occupation: retired Clinical cytogeneticist: RETIRED  Social Needs  .  Financial resource strain: Not on file  . Food insecurity:    Worry: Not on file    Inability: Not on file  . Transportation needs:    Medical: Not on file    Non-medical: Not on file  Tobacco Use  . Smoking status: Former Smoker    Packs/day: 0.50    Years: 40.00    Pack years: 20.00    Types: Cigarettes    Last attempt to quit: 02/13/2011    Years since quitting: 6.6  . Smokeless tobacco: Never Used  Substance and Sexual Activity  . Alcohol use: No  . Drug use: No  . Sexual activity: Not on file  Lifestyle  . Physical activity:    Days per week: Not on file    Minutes per session: Not on file  . Stress: Not on file  Relationships  . Social connections:    Talks on phone: Not on file    Gets together: Not on file    Attends religious service: Not on file    Active member of club or organization: Not on file    Attends meetings of clubs or organizations: Not on file    Relationship status: Not on file  . Intimate partner violence:    Fear of current or ex partner: Not on file    Emotionally abused: Not on file    Physically abused: Not on file    Forced sexual activity: Not on file  Other Topics Concern  . Not on file  Social History Narrative  . Not on file     Review of Systems: General: negative for chills, fever, night sweats or weight changes.  Cardiovascular: negative for chest pain, dyspnea on exertion, edema, orthopnea, palpitations, paroxysmal nocturnal dyspnea or shortness of breath Dermatological: negative for rash Respiratory: negative for cough or wheezing Urologic: negative for hematuria Abdominal: negative for nausea, vomiting, diarrhea, bright red blood per rectum, melena, or hematemesis Neurologic: negative for visual changes, syncope, or dizziness All other systems reviewed and are otherwise negative except as noted above.    Blood pressure (!) 116/59, pulse (!) 59, height 5\' 1"  (1.549 m), weight 115 lb 6.4 oz (52.3 kg).  General appearance:  alert and no distress Neck: no adenopathy, no JVD, supple, symmetrical, trachea midline, thyroid not enlarged, symmetric, no tenderness/mass/nodules and Bilateral carotid bruits Lungs: clear to auscultation bilaterally Heart: regular rate and rhythm, S1, S2 normal, no murmur, click, rub or gallop Extremities: extremities normal, atraumatic, no cyanosis or edema Pulses: 2+ and symmetric Skin: Skin color, texture, turgor normal. No rashes or lesions Neurologic: Alert and oriented X 3, normal strength and tone. Normal symmetric reflexes. Normal coordination and gait  EKG not performed today  ASSESSMENT AND PLAN:   HYPERLIPIDEMIA TYPE IIB / III She of hyperlipidemia on statin therapy with recent lipid profile performed 10/12/2017 revealing total cholesterol 91, LDL 41 and HDL 41.  Essential hypertension History of essential hypertension her  blood pressure measured today at 116/59 is on no antihypertensive medications.  CAD, NATIVE VESSEL She of CAD recent heart cath performed by Dr. Martinique urgently on 10/11/2017 revealing normal LV systolic function ostial ramus branch disease otherwise noncritical CAD unchanged from cardiac catheterization performed 10 years ago.  I did not think her pain based on this is vascular in nature.  Bilateral carotid artery disease (HCC) History of carotid artery disease status post duplex ultrasounds performed 02/13/2017 revealing moderately severe bilateral ICA stenosis.  Vertebral arteries were antegrade bilaterally and her subclavian arteries did not suggest high-grade disease.  We will continue to follow this on an annual basis.      Lorretta Harp MD FACP,FACC,FAHA, [facility] 10/22/2017 11:05 AM

## 2017-10-22 NOTE — Assessment & Plan Note (Signed)
History of carotid artery disease status post duplex ultrasounds performed 02/13/2017 revealing moderately severe bilateral ICA stenosis.  Vertebral arteries were antegrade bilaterally and her subclavian arteries did not suggest high-grade disease.  We will continue to follow this on an annual basis.

## 2017-10-22 NOTE — Patient Instructions (Signed)
Medication Instructions:  Your physician recommends that you continue on your current medications as directed. Please refer to the Current Medication list given to you today.   Labwork: none  Testing/Procedures: Your physician has requested that you have a carotid duplex. This test is an ultrasound of the carotid arteries in your neck. It looks at blood flow through these arteries that supply the brain with blood. Allow one hour for this exam. There are no restrictions or special instructions. September 2019    Follow-Up: We request that you follow-up in: 6 months with an extender and in 12 months with Dr Andria Rhein will receive a reminder letter in the mail two months in advance. If you don't receive a letter, please call our office to schedule the follow-up appointment.    Any Other Special Instructions Will Be Listed Below (If Applicable).     If you need a refill on your cardiac medications before your next appointment, please call your pharmacy.

## 2017-10-28 ENCOUNTER — Ambulatory Visit (INDEPENDENT_AMBULATORY_CARE_PROVIDER_SITE_OTHER): Payer: Medicare Other | Admitting: Family Medicine

## 2017-10-28 ENCOUNTER — Encounter: Payer: Self-pay | Admitting: Family Medicine

## 2017-10-28 ENCOUNTER — Other Ambulatory Visit: Payer: Self-pay

## 2017-10-28 VITALS — BP 122/70 | HR 86 | Temp 98.1°F | Resp 14 | Ht 61.0 in | Wt 114.0 lb

## 2017-10-28 DIAGNOSIS — G250 Essential tremor: Secondary | ICD-10-CM | POA: Diagnosis not present

## 2017-10-28 DIAGNOSIS — M5416 Radiculopathy, lumbar region: Secondary | ICD-10-CM

## 2017-10-28 DIAGNOSIS — M5412 Radiculopathy, cervical region: Secondary | ICD-10-CM

## 2017-10-28 DIAGNOSIS — I200 Unstable angina: Secondary | ICD-10-CM | POA: Diagnosis not present

## 2017-10-28 NOTE — Progress Notes (Signed)
Subjective:    Patient ID: Brittany Kirk, female    DOB: Sep 10, 1945, 72 y.o.   MRN: [id number]  HPI Patient was recently admitted to the hospital.  I have copied relevant portions of the discharge summary and included them below for my reference:  Admit Date: 10/11/2017 Discharge Date: 10/12/2017  Primary Care Provider: Susy Frizzle, MD Primary Cardiologist: Dr. Gwenlyn Found, MD  Discharge Diagnoses    Active Problems:   Unstable angina [facility])  History of Present Illness     72 year old female with history of CAD medically managed as detailed below, emphysema, CKD stage III, HTN, HLD, breast cancer s/p lumpectomy, carotid artery disease, prior tobacco abuse for 25 pack years quitting ~ 3 years prior, strong family history of heart disease with multiple siblings who passed away at a premature age from MIs, and GERD who was admitted to [facility] with chest pain on 10/11/2017.   Patient has history of a remote cardiac cath by Dr. Lia Foyer, MD, in 09/2008, that showed moderate calcification in the coronary arteries, high-grade ostial ramus intermedius at trifurcation location involving LAD, ramus and the AV circumflex. Medical therapy was pursued as lesion was not ideal for percutaneous intervention given his ostial location andit's relationship to the LAD and the left circumflex interface. Most recent ischemic evaluation by [facility] on 01/05/2017 that was negative for ischemia, no EKG changes, EF 83%, low risk scan. Over the fall and winter months of 2018, she had an upper and lower endoscopy that were both unrevealing. She was seen in the office on 5/3 for evaluation of chest pain. At that time, she noted increasing SOB, substernal chest pain that radiated to her back and left upper extremity. Her EKG showed no acute changes. She was transported via EMS to [facility].   Hospital Course     Consultants: none  Upon her arrival to [facility], troponin was cycled and she ruled out. CXR showed a  mild opacity of the right base. She underwent LHC on 10/11/2017 that showed single vessel CAD involving the ostium of the ramus intermediate branch. Cardiac cath was unchanged from prior study in 2010. Given prior concern regarding shifting plaque into the LAD or LCx with PCI. Patient had not been on any antianginal therapy and was noted to be severely hypertensive in the cath lab. Aggressive medical therapy and BP control was advised. She was started on Coreg and Imdur. If she continues to have refractory angina despite optimal medical therapy and BP control, PCI of the ramus could be considered. Post cath labs showed stable renal function. In the evening of 5/3, the patient was hypotensive with systolic BP in the 55D to 80s. There were no complications from her cardiac cath site. PM Coreg was held. She was given IV saline bolus with improvement in BP. Initial BP this morning was soft in the 32K systolic. Coreg was held again. BP 125 mmHg currently. Patient has explained to Dr. Rayann Heman that she only wants to take her prior to admission medications upon discharge and she will not be sent home on any new medications. She has been advised to follow up with Dr. Gwenlyn Found for further medical management (message has been sent to our office).   The patient's right femoral cardiac cath site has been examined and is healing well without issues at this time. The patient has been seen by Dr. Rayann Heman, MD and felt to be stable for discharge today. All follow up appointments have been made. Discharge  medications are listed below. Prescriptions have been reviewed with the patient and sent in to their pharmacy.  _____________  10/28/17 Patient is here today complaining of pain going up and down her back, tremor which is constant, and feeling tired all the time.  I reviewed her past records and she is also had a CT scan of the chest abdomen and pelvis in March of this year which was reassuring.  Those records are copied  below: IMPRESSION: No findings specific for recurrent or metastatic disease.  Status post right axillary lymph node dissection.  Faint subpleural micronodularity measuring up to 3 mm, predominantly in the bilateral upper lobes. Consider follow-up CT chest in 6 months to confirm stability. However, this appearance is not worrisome for metastatic disease.  1.8 cm left adrenal nodule, only minimally increased from 2014, suggesting a benign adrenal adenoma.  Can now is clear  Last year, I had seen the patient for persistent nausea and weight loss.  Work-up at that time revealed no specific etiology.  My working diagnosis was most likely anxiety and stress.  Today the patient states that she has had burning stinging pain radiating from her neck to her tailbone and from her tailbone up to her neck now for several months.  She also reports worsening weakness in her arms and legs.  She reports worsening essential tremor.  This now involves her head and neck as well as both arms.  It worsens with activity and is present with rest.  She reports continued weight loss.  She reports feeling extremely tired and weak.  Patient saw neurology last year and had an MRI of the brain as well as cervical spine that was essentially normal.  Patient states that if I sent her to a neurologist she wants to second opinion. Past Medical History:  Diagnosis Date  . Allergic rhinitis   . Anxiety   . Asymptomatic bilateral carotid artery stenosis   . CAD (coronary artery disease)   . Cancer ([place])   . Complication of anesthesia    sore throat after a surgery  . Depression   . Emphysema    no longer uses inhalers  . Gallstones    nausea, pain upper right abdomen  . GERD (gastroesophageal reflux disease)   . H/O hiatal hernia   . Headache   . History of skin cancer   . Hyperlipidemia   . Hypertension   . Shortness of breath    sob if walks up flight of stairs  . Tobacco user   . Wears dentures    top    Past Surgical History:  Procedure Laterality Date  . BREAST LUMPECTOMY Right 2015  . CHOLECYSTECTOMY  07/08/2012   Procedure: LAPAROSCOPIC CHOLECYSTECTOMY WITH INTRAOPERATIVE CHOLANGIOGRAM;  Surgeon: Gayland Curry, MD,FACS;  Location: [facility];  Service: General;  Laterality: N/A;  Laparoscopic Cholecystectomy with Intraoperative Cholangiogram  . COLONOSCOPY    . ESOPHAGOSCOPY N/A 05/29/2017   Procedure: ESOPHAGOSCOPY;  Surgeon: Clarene Essex, MD;  Location: [place];  Service: Endoscopy;  Laterality: N/A;  botox injection  . HAND SURGERY Left    wrist  . LEFT HEART CATH AND CORONARY ANGIOGRAPHY N/A 10/11/2017   Procedure: LEFT HEART CATH AND CORONARY ANGIOGRAPHY;  Surgeon: Martinique, Peter M, MD;  Location: [facility];  Service: Cardiovascular;  Laterality: N/A;  . TONSILECTOMY, ADENOIDECTOMY, BILATERAL MYRINGOTOMY AND TUBES    . TUBAL LIGATION     Current Outpatient Medications on File Prior to Visit  Medication Sig Dispense Refill  .  anastrozole (ARIMIDEX) 1 MG tablet Take 1 tablet (1 mg total) by mouth daily. 90 tablet 3  . aspirin 81 MG tablet Take 81 mg by mouth daily.    Marland Kitchen atorvastatin (LIPITOR) 40 MG tablet Take 1 tablet (40 mg total) by mouth at bedtime. 90 tablet 1  . Cyanocobalamin (B-12) 2500 MCG TABS Take 2,500 mcg by mouth daily.    . diazepam (VALIUM) 5 MG tablet Take 5 mg by mouth at bedtime  3  . escitalopram (LEXAPRO) 20 MG tablet Take 30 mg by mouth daily at 12 noon. Taking a 10mg  tablet for a total dose of 30mg     . ibuprofen (ADVIL,MOTRIN) 200 MG tablet Take 400 mg by mouth daily as needed for headache or moderate pain.    . Magnesium 250 MG TABS Take 250 mg by mouth daily.    . Multiple Vitamin (MULTIVITAMIN) tablet Take 1 tablet by mouth daily.    Marland Kitchen omeprazole (PRILOSEC) 40 MG capsule Take 40 mg by mouth daily at 3 pm.    . ondansetron (ZOFRAN) 4 MG tablet Take 4 mg by mouth every 8 (eight) hours as needed for nausea/vomiting.     No current  facility-administered medications on file prior to visit.    Allergies  Allergen Reactions  . Penicillins Anaphylaxis, Itching, Swelling and Rash    Has patient had a PCN reaction causing immediate rash, facial/tongue/throat swelling, SOB or lightheadedness with hypotension: Yes Has patient had a PCN reaction causing severe rash involving mucus membranes or skin necrosis: No Has patient had a PCN reaction that required hospitalization: Yes Has patient had a PCN reaction occurring within the last 10 years: No If all of the above answers are "NO", then may proceed with Cephalosporin use.   . Cefaclor Itching, Swelling and Rash  . Latex Itching and Rash  . Metronidazole Itching, Swelling and Rash  . Naproxen Itching, Swelling and Rash  . Nsaids Itching, Swelling and Rash    Ibuprofen IS tolerated  . Septra [Bactrim] Itching, Swelling and Rash  . Sulfonamide Derivatives Itching, Swelling and Rash  . Tape Itching and Rash   Social History   Socioeconomic History  . Marital status: Married    Spouse name: Not on file  . Number of children: 1  . Years of education: Not on file  . Highest education level: Not on file  Occupational History  . Occupation: retired Clinical cytogeneticist: RETIRED  Social Needs  . Financial resource strain: Not on file  . Food insecurity:    Worry: Not on file    Inability: Not on file  . Transportation needs:    Medical: Not on file    Non-medical: Not on file  Tobacco Use  . Smoking status: Former Smoker    Packs/day: 0.50    Years: 40.00    Pack years: 20.00    Types: Cigarettes    Last attempt to quit: 02/13/2011    Years since quitting: 6.7  . Smokeless tobacco: Never Used  Substance and Sexual Activity  . Alcohol use: No  . Drug use: No  . Sexual activity: Not on file  Lifestyle  . Physical activity:    Days per week: Not on file    Minutes per session: Not on file  . Stress: Not on file  Relationships  . Social connections:     Talks on phone: Not on file    Gets together: Not on file    Attends religious  service: Not on file    Active member of club or organization: Not on file    Attends meetings of clubs or organizations: Not on file    Relationship status: Not on file  . Intimate partner violence:    Fear of current or ex partner: Not on file    Emotionally abused: Not on file    Physically abused: Not on file    Forced sexual activity: Not on file  Other Topics Concern  . Not on file  Social History Narrative  . Not on file     Review of Systems  All other systems reviewed and are negative.      Objective:   Physical Exam  Constitutional: She is oriented to person, place, and time. She appears well-developed and well-nourished. No distress.  HENT:  Head: Normocephalic and atraumatic.  Right Ear: External ear normal.  Left Ear: External ear normal.  Mouth/Throat: Oropharynx is clear and moist. No oropharyngeal exudate.  Eyes: Pupils are equal, round, and reactive to light. Conjunctivae and EOM are normal.  Neck: Normal range of motion. No JVD present. No thyromegaly present.  Cardiovascular: Normal rate, regular rhythm and normal heart sounds. Exam reveals no gallop and no friction rub.  No murmur heard. Pulmonary/Chest: Effort normal and breath sounds normal. No stridor. No respiratory distress. She has no wheezes. She has no rales.  Abdominal: Soft. Bowel sounds are normal. She exhibits no distension. There is no tenderness. There is no guarding.  Lymphadenopathy:    She has no cervical adenopathy.  Neurological: She is alert and oriented to person, place, and time. She has normal strength and normal reflexes. She displays tremor. She displays no atrophy. No cranial nerve deficit or sensory deficit. She exhibits normal muscle tone. She displays a negative Romberg sign. She displays no seizure activity.  Skin: She is not diaphoretic.  Vitals reviewed.         Assessment & Plan:   Cervical radiculopathy - Plan: MR Cervical Spine Wo Contrast  Lumbar radiculopathy - Plan: MR Lumbar Spine Wo Contrast  Essential tremor  I spent approximately 30 minutes with the patient today conducting her history and her exam.  I have also spent a great deal of time reviewing her past medical records over the last several months including her referral to neurology, her follow-up with her oncologist, her recent admission to cardiology.  Given her unusual radicular almost neuropathic pain, I reviewed her records and found that she recently had a TSH in May of this year that was normal.  She had a vitamin B12 level checked in October 2018 that was normal.  She had an MRI in February 2018 of her neck that was normal.  She has had a CT scan of the chest abdomen and pelvis that was normal or at least showed no specific cause of her symptoms.  As I explained to the patient, she has had numerous somatic complaints over the last 12 to 18 months with no specific finding on thorough diagnostic work-up.  I believe this could be related to anxiety, stress, and underlying depression.  Patient disagrees with this assessment.  She states that emotionally she feels fine.  Therefore I will proceed with an MRI of the cervical and lumbar spine to evaluate for any abnormalities that would explain her radicular pain and neuropathic pain that radiates from her neck to her tailbone and from her tailbone to her neck that may also cause her subjective weakness in her arms and  legs.  Differential diagnosis includes multiple sclerosis.  Her essential tremor is certainly worsening however I will defer that at the present time until I have the results of her MRI.  If MRI shows no specific cause for symptoms, I will revisit possible somatization disorder.

## 2017-10-29 ENCOUNTER — Telehealth: Payer: Self-pay | Admitting: Family Medicine

## 2017-10-29 NOTE — Telephone Encounter (Signed)
Ok, be glad to see her

## 2017-10-29 NOTE — Telephone Encounter (Signed)
Patient called and left voicemail stating that she would like to hold off on the MRI. States that she will scheduled office visit to discuss.

## 2017-10-30 NOTE — Telephone Encounter (Signed)
Spoke with patient and she is scheduled to come in and discuss MRI on 11/05/17

## 2017-11-03 ENCOUNTER — Other Ambulatory Visit: Payer: Self-pay | Admitting: Family Medicine

## 2017-11-05 ENCOUNTER — Encounter: Payer: Self-pay | Admitting: Family Medicine

## 2017-11-05 ENCOUNTER — Ambulatory Visit (INDEPENDENT_AMBULATORY_CARE_PROVIDER_SITE_OTHER): Payer: Medicare Other | Admitting: Family Medicine

## 2017-11-05 VITALS — BP 138/60 | HR 76 | Temp 98.0°F | Wt 115.0 lb

## 2017-11-05 DIAGNOSIS — M5412 Radiculopathy, cervical region: Secondary | ICD-10-CM | POA: Diagnosis not present

## 2017-11-05 DIAGNOSIS — G250 Essential tremor: Secondary | ICD-10-CM | POA: Diagnosis not present

## 2017-11-05 DIAGNOSIS — I200 Unstable angina: Secondary | ICD-10-CM | POA: Diagnosis not present

## 2017-11-05 DIAGNOSIS — M5416 Radiculopathy, lumbar region: Secondary | ICD-10-CM

## 2017-11-05 DIAGNOSIS — F321 Major depressive disorder, single episode, moderate: Secondary | ICD-10-CM | POA: Diagnosis not present

## 2017-11-05 DIAGNOSIS — R5382 Chronic fatigue, unspecified: Secondary | ICD-10-CM

## 2017-11-05 DIAGNOSIS — R110 Nausea: Secondary | ICD-10-CM

## 2017-11-05 MED ORDER — DULOXETINE HCL 60 MG PO CPEP: 60.0000 mg | ORAL_CAPSULE | Freq: Every day | ORAL | 3 refills | Status: DC

## 2017-11-05 NOTE — Progress Notes (Signed)
Subjective:    Patient ID: Brittany Kirk, female    DOB: Sep 10, 1945, 72 y.o.   MRN: [id number]  HPI Patient was recently admitted to the hospital.  I have copied relevant portions of the discharge summary and included them below for my reference:  Admit Date: 10/11/2017 Discharge Date: 10/12/2017  Primary Care Provider: Susy Frizzle, MD Primary Cardiologist: Dr. Gwenlyn Found, MD  Discharge Diagnoses    Active Problems:   Unstable angina [facility])  History of Present Illness     72 year old female with history of CAD medically managed as detailed below, emphysema, CKD stage III, HTN, HLD, breast cancer s/p lumpectomy, carotid artery disease, prior tobacco abuse for 25 pack years quitting ~ 3 years prior, strong family history of heart disease with multiple siblings who passed away at a premature age from MIs, and GERD who was admitted to [facility] with chest pain on 10/11/2017.   Patient has history of a remote cardiac cath by Dr. Lia Foyer, MD, in 09/2008, that showed moderate calcification in the coronary arteries, high-grade ostial ramus intermedius at trifurcation location involving LAD, ramus and the AV circumflex. Medical therapy was pursued as lesion was not ideal for percutaneous intervention given his ostial location andit's relationship to the LAD and the left circumflex interface. Most recent ischemic evaluation by [facility] on 01/05/2017 that was negative for ischemia, no EKG changes, EF 83%, low risk scan. Over the fall and winter months of 2018, she had an upper and lower endoscopy that were both unrevealing. She was seen in the office on 5/3 for evaluation of chest pain. At that time, she noted increasing SOB, substernal chest pain that radiated to her back and left upper extremity. Her EKG showed no acute changes. She was transported via EMS to [facility].   Hospital Course     Consultants: none  Upon her arrival to [facility], troponin was cycled and she ruled out. CXR showed a  mild opacity of the right base. She underwent LHC on 10/11/2017 that showed single vessel CAD involving the ostium of the ramus intermediate branch. Cardiac cath was unchanged from prior study in 2010. Given prior concern regarding shifting plaque into the LAD or LCx with PCI. Patient had not been on any antianginal therapy and was noted to be severely hypertensive in the cath lab. Aggressive medical therapy and BP control was advised. She was started on Coreg and Imdur. If she continues to have refractory angina despite optimal medical therapy and BP control, PCI of the ramus could be considered. Post cath labs showed stable renal function. In the evening of 5/3, the patient was hypotensive with systolic BP in the 55D to 80s. There were no complications from her cardiac cath site. PM Coreg was held. She was given IV saline bolus with improvement in BP. Initial BP this morning was soft in the 32K systolic. Coreg was held again. BP 125 mmHg currently. Patient has explained to Dr. Rayann Heman that she only wants to take her prior to admission medications upon discharge and she will not be sent home on any new medications. She has been advised to follow up with Dr. Gwenlyn Found for further medical management (message has been sent to our office).   The patient's right femoral cardiac cath site has been examined and is healing well without issues at this time. The patient has been seen by Dr. Rayann Heman, MD and felt to be stable for discharge today. All follow up appointments have been made. Discharge  medications are listed below. Prescriptions have been reviewed with the patient and sent in to their pharmacy.  _____________  10/28/17 Patient is here today complaining of pain going up and down her back, tremor which is constant, and feeling tired all the time.  I reviewed her past records and she is also had a CT scan of the chest abdomen and pelvis in March of this year which was reassuring.  Those records are copied  below: IMPRESSION: No findings specific for recurrent or metastatic disease.  Status post right axillary lymph node dissection.  Faint subpleural micronodularity measuring up to 3 mm, predominantly in the bilateral upper lobes. Consider follow-up CT chest in 6 months to confirm stability. However, this appearance is not worrisome for metastatic disease.  1.8 cm left adrenal nodule, only minimally increased from 2014, suggesting a benign adrenal adenoma.  Can now is clear  Last year, I had seen the patient for persistent nausea and weight loss.  Work-up at that time revealed no specific etiology.  My working diagnosis was most likely anxiety and stress.  Today the patient states that she has had burning stinging pain radiating from her neck to her tailbone and from her tailbone up to her neck now for several months.  She also reports worsening weakness in her arms and legs.  She reports worsening essential tremor.  This now involves her head and neck as well as both arms.  It worsens with activity and is present with rest.  She reports continued weight loss.  She reports feeling extremely tired and weak.  Patient saw neurology last year and had an MRI of the brain as well as cervical spine that was essentially normal.  Patient states that if I sent her to a neurologist she wants to second opinion.  At that time, my plan was: I spent approximately 30 minutes with the patient today conducting her history and her exam.  I have also spent a great deal of time reviewing her past medical records over the last several months including her referral to neurology, her follow-up with her oncologist, her recent admission to cardiology.  Given her unusual radicular almost neuropathic pain, I reviewed her records and found that she recently had a TSH in May of this year that was normal.  She had a vitamin B12 level checked in October 2018 that was normal.  She had an MRI in February 2018 of her neck that was  normal.  She has had a CT scan of the chest abdomen and pelvis that was normal or at least showed no specific cause of her symptoms.  As I explained to the patient, she has had numerous somatic complaints over the last 12 to 18 months with no specific finding on thorough diagnostic work-up.  I believe this could be related to anxiety, stress, and underlying depression.  Patient disagrees with this assessment.  She states that emotionally she feels fine.  Therefore I will proceed with an MRI of the cervical and lumbar spine to evaluate for any abnormalities that would explain her radicular pain and neuropathic pain that radiates from her neck to her tailbone and from her tailbone to her neck that may also cause her subjective weakness in her arms and legs.  Differential diagnosis includes multiple sclerosis.  Her essential tremor is certainly worsening however I will defer that at the present time until I have the results of her MRI.  If MRI shows no specific cause for symptoms, I will revisit possible  somatization disorder.    11/05/17 Patient called 5/21 and asked me to not order the MRI.  However the patient has an appointment already scheduled for Saturday.  She now states that she would like to keep that appointment.  She is here today continuing to complain of a burning heat warm sensation radiating from her neck to her tailbone and from her tailbone to her neck.  The pain, for lack of a better word, comes and goes with no specific cause and no specific alleviating factor.  She also continues to report nausea.  Nothing has helped her nausea.. Wt Readings from Last 3 Encounters:  11/05/17 115 lb (52.2 kg)  10/28/17 114 lb (51.7 kg)  10/22/17 115 lb 6.4 oz (52.3 kg)   Thankfully her weight has remained relatively stable over the last few weeks as evidenced above.  She denies any vomiting.  She has had an exhaustive GI work-up last year with no specific cause for her nausea.  As I explained to the patient  and her last visit, I believe some the symptoms could be conversion disorder essentially.  I wonder if stress and underlying depression may be causing a myriad of somatic complaints.  She does report depression.  She reports poor energy.  She reports fatigue.  She reports poor appetite.  She denies trouble sleeping.  She denies suicidal ideation.  She does report loss of interest/anhedonia.  She independently discontinued Lexapro due to perceived lack of benefit Past Medical History:  Diagnosis Date  . Allergic rhinitis   . Anxiety   . Asymptomatic bilateral carotid artery stenosis   . CAD (coronary artery disease)   . Cancer ([place])   . Complication of anesthesia    sore throat after a surgery  . Depression   . Emphysema    no longer uses inhalers  . Gallstones    nausea, pain upper right abdomen  . GERD (gastroesophageal reflux disease)   . H/O hiatal hernia   . Headache   . History of skin cancer   . Hyperlipidemia   . Hypertension   . Shortness of breath    sob if walks up flight of stairs  . Tobacco user   . Wears dentures    top   Past Surgical History:  Procedure Laterality Date  . BREAST LUMPECTOMY Right 2015  . CHOLECYSTECTOMY  07/08/2012   Procedure: LAPAROSCOPIC CHOLECYSTECTOMY WITH INTRAOPERATIVE CHOLANGIOGRAM;  Surgeon: Gayland Curry, MD,FACS;  Location: [facility];  Service: General;  Laterality: N/A;  Laparoscopic Cholecystectomy with Intraoperative Cholangiogram  . COLONOSCOPY    . ESOPHAGOSCOPY N/A 05/29/2017   Procedure: ESOPHAGOSCOPY;  Surgeon: Clarene Essex, MD;  Location: [facility];  Service: Endoscopy;  Laterality: N/A;  botox injection  . HAND SURGERY Left    wrist  . LEFT HEART CATH AND CORONARY ANGIOGRAPHY N/A 10/11/2017   Procedure: LEFT HEART CATH AND CORONARY ANGIOGRAPHY;  Surgeon: Martinique, Peter M, MD;  Location: [facility];  Service: Cardiovascular;  Laterality: N/A;  . TONSILECTOMY, ADENOIDECTOMY, BILATERAL MYRINGOTOMY AND TUBES    . TUBAL  LIGATION     Current Outpatient Medications on File Prior to Visit  Medication Sig Dispense Refill  . anastrozole (ARIMIDEX) 1 MG tablet Take 1 tablet (1 mg total) by mouth daily. 90 tablet 3  . aspirin 81 MG tablet Take 81 mg by mouth daily.    Marland Kitchen atorvastatin (LIPITOR) 40 MG tablet TAKE 1 TABLET BY MOUTH EVERYDAY AT BEDTIME 90 tablet 1  . Cyanocobalamin (B-12)  2500 MCG TABS Take 2,500 mcg by mouth daily.    . diazepam (VALIUM) 5 MG tablet Take 5 mg by mouth at bedtime  3  . escitalopram (LEXAPRO) 20 MG tablet Take 30 mg by mouth daily at 12 noon. Taking a '10mg'$  tablet for a total dose of '30mg'$     . ibuprofen (ADVIL,MOTRIN) 200 MG tablet Take 400 mg by mouth daily as needed for headache or moderate pain.    . Magnesium 250 MG TABS Take 250 mg by mouth daily.    . Multiple Vitamin (MULTIVITAMIN) tablet Take 1 tablet by mouth daily.    Marland Kitchen omeprazole (PRILOSEC) 40 MG capsule Take 40 mg by mouth daily at 3 pm.    . ondansetron (ZOFRAN) 4 MG tablet Take 4 mg by mouth every 8 (eight) hours as needed for nausea/vomiting.     No current facility-administered medications on file prior to visit.    Allergies  Allergen Reactions  . Penicillins Anaphylaxis, Itching, Swelling and Rash    Has patient had a PCN reaction causing immediate rash, facial/tongue/throat swelling, SOB or lightheadedness with hypotension: Yes Has patient had a PCN reaction causing severe rash involving mucus membranes or skin necrosis: No Has patient had a PCN reaction that required hospitalization: Yes Has patient had a PCN reaction occurring within the last 10 years: No If all of the above answers are "NO", then may proceed with Cephalosporin use.   . Cefaclor Itching, Swelling and Rash  . Latex Itching and Rash  . Metronidazole Itching, Swelling and Rash  . Naproxen Itching, Swelling and Rash  . Nsaids Itching, Swelling and Rash    Ibuprofen IS tolerated  . Septra [Bactrim] Itching, Swelling and Rash  . Sulfonamide  Derivatives Itching, Swelling and Rash  . Tape Itching and Rash   Social History   Socioeconomic History  . Marital status: Married    Spouse name: Not on file  . Number of children: 1  . Years of education: Not on file  . Highest education level: Not on file  Occupational History  . Occupation: retired Clinical cytogeneticist: RETIRED  Social Needs  . Financial resource strain: Not on file  . Food insecurity:    Worry: Not on file    Inability: Not on file  . Transportation needs:    Medical: Not on file    Non-medical: Not on file  Tobacco Use  . Smoking status: Former Smoker    Packs/day: 0.50    Years: 40.00    Pack years: 20.00    Types: Cigarettes    Last attempt to quit: 02/13/2011    Years since quitting: 6.7  . Smokeless tobacco: Never Used  Substance and Sexual Activity  . Alcohol use: No  . Drug use: No  . Sexual activity: Not on file  Lifestyle  . Physical activity:    Days per week: Not on file    Minutes per session: Not on file  . Stress: Not on file  Relationships  . Social connections:    Talks on phone: Not on file    Gets together: Not on file    Attends religious service: Not on file    Active member of club or organization: Not on file    Attends meetings of clubs or organizations: Not on file    Relationship status: Not on file  . Intimate partner violence:    Fear of current or ex partner: Not on file  Emotionally abused: Not on file    Physically abused: Not on file    Forced sexual activity: Not on file  Other Topics Concern  . Not on file  Social History Narrative  . Not on file     Review of Systems  All other systems reviewed and are negative.      Objective:   Physical Exam  Constitutional: She is oriented to person, place, and time. She appears well-developed and well-nourished. No distress.  HENT:  Head: Normocephalic and atraumatic.  Right Ear: External ear normal.  Left Ear: External ear normal.   Mouth/Throat: Oropharynx is clear and moist. No oropharyngeal exudate.  Eyes: Pupils are equal, round, and reactive to light. Conjunctivae and EOM are normal.  Neck: Normal range of motion. No JVD present. No thyromegaly present.  Cardiovascular: Normal rate, regular rhythm and normal heart sounds. Exam reveals no gallop and no friction rub.  No murmur heard. Pulmonary/Chest: Effort normal and breath sounds normal. No stridor. No respiratory distress. She has no wheezes. She has no rales.  Abdominal: Soft. Bowel sounds are normal. She exhibits no distension. There is no tenderness. There is no guarding.  Lymphadenopathy:    She has no cervical adenopathy.  Neurological: She is alert and oriented to person, place, and time. She has normal strength and normal reflexes. She displays tremor. She displays no atrophy. No cranial nerve deficit or sensory deficit. She exhibits normal muscle tone. She displays a negative Romberg sign. She displays no seizure activity.  Skin: She is not diaphoretic.  Vitals reviewed.         Assessment & Plan:  I am not certain of the diagnosis but I am concerned that some of this may be conversion disorder and that some of her physical symptoms could be due to to underlying depression/stress/etc.  I am awaiting the results of the MRI but I have recommended starting the patient on Cymbalta 60 mg a day and rechecking in 4 weeks to see if her symptoms will improve.  I also believe there may be added benefit given some of the effect Cymbalta has been treating neuropathic pain.  Await the results of the MRI as previously scheduled.

## 2017-11-06 DIAGNOSIS — Z85828 Personal history of other malignant neoplasm of skin: Secondary | ICD-10-CM | POA: Diagnosis not present

## 2017-11-06 DIAGNOSIS — L570 Actinic keratosis: Secondary | ICD-10-CM | POA: Diagnosis not present

## 2017-11-06 DIAGNOSIS — L720 Epidermal cyst: Secondary | ICD-10-CM | POA: Diagnosis not present

## 2017-11-06 DIAGNOSIS — L723 Sebaceous cyst: Secondary | ICD-10-CM | POA: Diagnosis not present

## 2017-11-06 DIAGNOSIS — L821 Other seborrheic keratosis: Secondary | ICD-10-CM | POA: Diagnosis not present

## 2017-11-09 ENCOUNTER — Ambulatory Visit
Admission: RE | Admit: 2017-11-09 | Discharge: 2017-11-09 | Disposition: A | Discharge status: Home / Self Care | Payer: Medicare Other | Source: Ambulatory Visit | Attending: Family Medicine | Admitting: Family Medicine

## 2017-11-09 DIAGNOSIS — M5416 Radiculopathy, lumbar region: Secondary | ICD-10-CM

## 2017-11-09 DIAGNOSIS — M5412 Radiculopathy, cervical region: Secondary | ICD-10-CM

## 2017-11-15 ENCOUNTER — Encounter (INDEPENDENT_AMBULATORY_CARE_PROVIDER_SITE_OTHER): Payer: Self-pay

## 2017-11-19 ENCOUNTER — Ambulatory Visit (INDEPENDENT_AMBULATORY_CARE_PROVIDER_SITE_OTHER): Payer: Medicare Other | Admitting: Family Medicine

## 2017-11-19 ENCOUNTER — Encounter: Payer: Self-pay | Admitting: Family Medicine

## 2017-11-19 VITALS — BP 126/82 | HR 68 | Temp 98.0°F | Resp 16 | Ht 61.0 in | Wt 114.4 lb

## 2017-11-19 DIAGNOSIS — I200 Unstable angina: Secondary | ICD-10-CM

## 2017-11-19 DIAGNOSIS — M545 Low back pain, unspecified: Secondary | ICD-10-CM

## 2017-11-19 MED ORDER — HYDROCODONE-ACETAMINOPHEN 5-325 MG PO TABS: 1.0000 | ORAL_TABLET | Freq: Three times a day (TID) | ORAL | 0 refills | Status: DC | PRN

## 2017-11-19 MED ORDER — CYCLOBENZAPRINE HCL 5 MG PO TABS: 5.0000 mg | ORAL_TABLET | Freq: Three times a day (TID) | ORAL | 1 refills | Status: DC | PRN

## 2017-11-19 MED ORDER — HYDROCODONE-ACETAMINOPHEN 5-325 MG PO TABS: 1.0000 | ORAL_TABLET | Freq: Three times a day (TID) | ORAL | 0 refills | Status: AC | PRN

## 2017-11-19 NOTE — Progress Notes (Signed)
Patient ID: Brittany Kirk, female    DOB: 06-Sep-1945, 72 y.o.   MRN: [id number]  PCP: Susy Frizzle, MD  Chief Complaint  Patient presents with  . Back Pain    lifting symptoms x1 week     Subjective:   Brittany Kirk is a 73 y.o. female, presents to clinic with CC of bilateral low back pain, onset one week ago when lifting and putting heavy pile of magazines up onto a shelf.  Sudden onset of pulling to her bilateral low back while her arms are outstretched leaning and bending forward, attempting to put heavy pile up onto a shelf.  Pain has gradually worsened since that time, it is described as sharp, tense and pulling, constant and gradually worsening, exacerbated with movement.  It tends to become more sharp with movements.  Also exacerbated at night when rolling over in bed, first trying to get up out of bed or sit up in bed tends to pull more severely to the right low back down into her buttocks.  She rates pain as a following: when still/at rest 6/10, moving 8/10, rolling over in bed a 10/10.  Has tried advil, heating pad, valium at night with no improvement.  No other alleviating or aggravating factors.  She does have a past medical history of osteoporosis, cervical radiculopathy, is starting bone medications in 3 months.  She just completed MRI and she refuses to do any imaging at this time.   She denies any radiation of pain down legs, denies any numbness, tingling or weakness in her lower extremities, denies any saddle anesthesia, incontinence of stool or urine.  Denies any urinary symptoms at all, including no flank pain, urinary frequency, urgency, bladder pressure, dysuria, hematuria.     Patient Active Problem List   Diagnosis Date Noted  . Unstable angina ([place]) 10/11/2017  . Caloric malnutrition ([place]) 08/16/2017  . Weight loss, unintentional 03/12/2017  . GERD (gastroesophageal reflux disease) 08/10/2016  . Cough 08/10/2016  . Acute bronchitis 07/12/2016  . Bilateral  carotid artery disease ([place]) 02/14/2016  . Chest pain 01/13/2016  . Osteoporosis 02/22/2015  . CKD (chronic kidney disease), stage III ([place]) 08/16/2014  . Malignant neoplasm of upper-outer quadrant of right breast in female, estrogen receptor positive ([place]) 03/22/2014  . Dizziness and giddiness 11/27/2012  . Essential tremor 11/27/2012  . Numbness 11/27/2012  . TOBACCO USER 10/04/2009  . CLOSED FRACTURE OF ONE RIB 07/12/2009  . HYPERLIPIDEMIA TYPE IIB / III 09/28/2008  . CAD, NATIVE VESSEL 09/28/2008  . Rhinitis 09/09/2008  . COPD (chronic obstructive pulmonary disease) ([place]) 09/07/2008  . Essential hypertension 09/06/2008  . SKIN CANCER, HX OF 09/06/2008     Prior to Admission medications   Medication Sig Start Date End Date Taking? Authorizing Provider  anastrozole (ARIMIDEX) 1 MG tablet Take 1 tablet (1 mg total) by mouth daily. 01/30/17  Yes Magrinat, Virgie Dad, MD  aspirin 81 MG tablet Take 81 mg by mouth daily.   Yes [provider]  atorvastatin (LIPITOR) 40 MG tablet TAKE 1 TABLET BY MOUTH EVERYDAY AT BEDTIME 11/05/17  Yes Susy Frizzle, MD  Cyanocobalamin (B-12) 2500 MCG TABS Take 2,500 mcg by mouth daily.   Yes [provider]  diazepam (VALIUM) 5 MG tablet Take 5 mg by mouth at bedtime 07/19/15  Yes [provider]  DULoxetine (CYMBALTA) 60 MG capsule Take 1 capsule (60 mg total) by mouth daily. 11/05/17  Yes Susy Frizzle, MD  ibuprofen (ADVIL,MOTRIN)  200 MG tablet Take 400 mg by mouth daily as needed for headache or moderate pain.   Yes [provider]  Multiple Vitamin (MULTIVITAMIN) tablet Take 1 tablet by mouth daily.   Yes [provider]  omeprazole (PRILOSEC) 40 MG capsule Take 40 mg by mouth daily at 3 pm.   Yes [provider]  ondansetron (ZOFRAN) 4 MG tablet Take 4 mg by mouth every 8 (eight) hours as needed for nausea/vomiting. 03/13/17  Yes [provider]     Allergies  Allergen Reactions  .  Penicillins Anaphylaxis, Itching, Swelling and Rash    Has patient had a PCN reaction causing immediate rash, facial/tongue/throat swelling, SOB or lightheadedness with hypotension: Yes Has patient had a PCN reaction causing severe rash involving mucus membranes or skin necrosis: No Has patient had a PCN reaction that required hospitalization: Yes Has patient had a PCN reaction occurring within the last 10 years: No If all of the above answers are "NO", then may proceed with Cephalosporin use.   . Cefaclor Itching, Swelling and Rash  . Latex Itching and Rash  . Metronidazole Itching, Swelling and Rash  . Naproxen Itching, Swelling and Rash  . Nsaids Itching, Swelling and Rash    Ibuprofen IS tolerated  . Septra [Bactrim] Itching, Swelling and Rash  . Sulfonamide Derivatives Itching, Swelling and Rash  . Tape Itching and Rash     Family History  Problem Relation Age of Onset  . Arthritis Mother   . Heart disease Unknown        5 brothers and 2 sister  . Diverticulitis Sister   . Aplastic anemia Other      Social History   Socioeconomic History  . Marital status: Married    Spouse name: Not on file  . Number of children: 1  . Years of education: Not on file  . Highest education level: Not on file  Occupational History  . Occupation: retired Clinical cytogeneticist: RETIRED  Social Needs  . Financial resource strain: Not on file  . Food insecurity:    Worry: Not on file    Inability: Not on file  . Transportation needs:    Medical: Not on file    Non-medical: Not on file  Tobacco Use  . Smoking status: Former Smoker    Packs/day: 0.50    Years: 40.00    Pack years: 20.00    Types: Cigarettes    Last attempt to quit: 02/13/2011    Years since quitting: 6.7  . Smokeless tobacco: Never Used  Substance and Sexual Activity  . Alcohol use: No  . Drug use: No  . Sexual activity: Not on file  Lifestyle  . Physical activity:    Days per week: Not on file     Minutes per session: Not on file  . Stress: Not on file  Relationships  . Social connections:    Talks on phone: Not on file    Gets together: Not on file    Attends religious service: Not on file    Active member of club or organization: Not on file    Attends meetings of clubs or organizations: Not on file    Relationship status: Not on file  . Intimate partner violence:    Fear of current or ex partner: Not on file    Emotionally abused: Not on file    Physically abused: Not on file    Forced sexual activity: Not on file  Other Topics Concern  . Not on file  Social History Narrative  . Not on file     Review of Systems  Constitutional: Positive for unexpected weight change. Negative for activity change, appetite change, chills, diaphoresis, fatigue and fever.  HENT: Negative.   Eyes: Negative.   Respiratory: Negative for apnea, chest tightness, shortness of breath and wheezing.   Cardiovascular: Negative.  Negative for chest pain.  Gastrointestinal: Negative.  Negative for abdominal distention, abdominal pain, constipation, diarrhea, nausea and vomiting.  Endocrine: Negative.   Genitourinary: Negative.  Negative for decreased urine volume, difficulty urinating, dysuria, enuresis, flank pain, frequency, hematuria and urgency.  Musculoskeletal: Positive for back pain. Negative for gait problem, joint swelling, myalgias, neck pain and neck stiffness.  Skin: Negative.  Negative for color change.  Allergic/Immunologic: Negative.   Neurological: Negative.  Negative for weakness and numbness.  All other systems reviewed and are negative.      Objective:    Vitals:   11/19/17 1125  BP: 126/82  Pulse: 68  Resp: 16  Temp: 98 F (36.7 C)  TempSrc: Oral  SpO2: 98%  Weight: 114 lb 6.4 oz (51.9 kg)  Height: 5\' 1"  (1.549 m)      Physical Exam  Constitutional: She is oriented to person, place, and time. She appears well-developed and well-nourished. No distress.    Well-appearing, thin elderly female, no acute distress, nontoxic-appearing  HENT:  Head: Normocephalic and atraumatic.  Nose: Nose normal.  Eyes: Pupils are equal, round, and reactive to light. Conjunctivae are normal. Right eye exhibits no discharge. Left eye exhibits no discharge.  Neck: Normal range of motion. No tracheal deviation present.  Cardiovascular: Normal rate, regular rhythm, normal heart sounds and intact distal pulses. Exam reveals no gallop and no friction rub.  No murmur heard. Pulmonary/Chest: Effort normal and breath sounds normal. No stridor. No respiratory distress. She has no wheezes. She has no rales.  Abdominal: Soft. Bowel sounds are normal. She exhibits no distension and no mass. There is no tenderness. There is no guarding.  No CVA tenderness bilaterally  Musculoskeletal: She exhibits tenderness.       Lumbar back: She exhibits decreased range of motion and tenderness. She exhibits no bony tenderness, no swelling, no edema and no spasm.       Back:  Negative straight leg raise bilaterally  Neurological: She is alert and oriented to person, place, and time. She has normal strength. She displays no tremor. No sensory deficit. She exhibits normal muscle tone. Coordination and gait normal.  5 out of 5 dorsiflexion and plantarflexion bilaterally, 5 out of 5 flexion extension at the knees Within normal sensation to light touch in all extremities  Skin: Skin is warm and dry. No rash noted. She is not diaphoretic.  Psychiatric: She has a normal mood and affect. Her behavior is normal.  Nursing note and vitals reviewed.         Assessment & Plan:      ICD-10-CM   1. Acute bilateral low back pain without sciatica M54.5 cyclobenzaprine (FLEXERIL) 5 MG tablet    HYDROcodone-acetaminophen (NORCO) 5-325 MG tablet    Concern for low back strain without sciatica, patient was leaning forward carrying a heavy pile of magazines and attempting to place them on a high shelf  when she felt a sudden pull in her low back, pain is been constant since that time with muscle spasms, tightness particularly worse symptoms after any period of immobility such as going to bed at  night.   On exam she has reproducible lumbar paraspinal muscle tenderness to palpation, and some tenderness to palpation over right SI joint.  She does appear uncomfortable with minor position changes and has decreased range of motion particularly low back with flexion and extension.  She has no red flags concerning for cauda equina, such as no decreased strength or sensation, no saddle anesthesia or incontinence.  I was slightly concerned with her history of radiculopathy and osteoporosis, asked her to get some x-rays done but she refused.   Patient is already taken 5 mg of Valium at night for other issues, this did not help her much so have concerns that any other muscle relaxers be likely ineffective.  She also cannot take NSAIDs except for ibuprofen.  She currently does not have lumbar radiculopathy with indication for steroid treatment so do not feel steroids are appropriate at this time either.   Plan to treat with frequent heat therapy, frequent position changes, ibuprofen as tolerated.  We will give lower dose Flexeril to try for the mornings and to try at night.  Patient understands that she is not able to take Flexeril and Valium at the same time.  I reviewed this plan with her and again urged her to get x-rays because it would be reassuring if normal, also urged her to have some patience with her body, that would likely take several weeks or 1 to 2 months to recover from a muscle strain, she did ask for pain medication.  We will give low-dose norco, reviewed all of her medications, and printed out for her specific instructions not to take any other pain medications muscle relaxers or Valium at the same time because of sedating and potentiating effects.  Verbalized understanding.  She stated that she would hold  her Valium at night because she does not take it every night, try muscle relaxers first, and use pain medication sparingly with ibuprofen and Tylenol first-line.  F/up in 1-2 weeks, if spasms and severe inflammation is resolving, pt may benefit from PT.  If pt develops sciatica or radicular sx, she was encouraged to contact us for re-eval may need steroids.   Delsa Grana, PA-C 11/19/17 11:45 AM

## 2017-11-19 NOTE — Patient Instructions (Addendum)
No not take flexeril and valium at the same time.  If you take flexeril at night you can take 1-2 pills, but do not take with valium  Do not take valium with pain meds     Heat Therapy Heat therapy can help ease sore, stiff, injured, and tight muscles and joints. Heat relaxes your muscles, which may help ease your pain. What are the risks? If you have any of the following conditions, do not use heat therapy unless your health care provider has approved:  Poor circulation.  Healing wounds or scarred skin in the area being treated.  Diabetes, heart disease, or high blood pressure.  Not being able to feel (numbness) the area being treated.  Unusual swelling of the area being treated.  Active infections.  Blood clots.  Cancer.  Inability to communicate pain. This may include young children and people who have problems with their brain function (dementia).  Pregnancy.  Heat therapy should only be used on old, pre-existing, or long-lasting (chronic) injuries. Do not use heat therapy on new injuries unless directed by your health care provider. How to use heat therapy There are several different kinds of heat therapy, including:  Moist heat pack.  Warm water bath.  Hot water bottle.  Electric heating pad.  Heated gel pack.  Heated wrap.  Electric heating pad.  Use the heat therapy method suggested by your health care provider. Follow your health care provider's instructions on when and how to use heat therapy. General heat therapy recommendations  Do not sleep while using heat therapy. Only use heat therapy while you are awake.  Your skin may turn pink while using heat therapy. Do not use heat therapy if your skin turns red.  Do not use heat therapy if you have new pain.  High heat or long exposure to heat can cause burns. Be careful when using heat therapy to avoid burning your skin.  Do not use heat therapy on areas of your skin that are already irritated,  such as with a rash or sunburn. Contact a health care provider if:  You have blisters, redness, swelling, or numbness.  You have new pain.  Your pain is worse. This information is not intended to replace advice given to you by your health care provider. Make sure you discuss any questions you have with your health care provider. Document Released: 08/20/2011 Document Revised: 11/03/2015 Document Reviewed: 07/21/2013 Elsevier Interactive Patient Education  2018 [place].   Lumbosacral Strain Lumbosacral strain is an injury that causes pain in the lower back (lumbosacral spine). This injury usually occurs from overstretching the muscles or ligaments along your spine. A strain can affect one or more muscles or cord-like tissues that connect bones to other bones (ligaments). What are the causes? This condition may be caused by:  A hard, direct hit (blow) to the back.  Excessive stretching of the lower back muscles. This may result from: ? A fall. ? Lifting something heavy. ? Repetitive movements such as bending or crouching.  What increases the risk? The following factors may increase your risk of getting this condition:  Participating in sports or activities that involve: ? A sudden twist of the back. ? Pushing or pulling motions.  Being overweight or obese.  Having poor strength and flexibility, especially tight hamstrings or weak muscles in the back or abdomen.  Having too much of a curve in the lower back.  Having a pelvis that is tilted forward.  What are the signs or  symptoms? The main symptom of this condition is pain in the lower back, at the site of the strain. Pain may extend (radiate) down one or both legs. How is this diagnosed? This condition is diagnosed based on:  Your symptoms.  Your medical history.  A physical exam. ? Your health care provider may push on certain areas of your back to determine the source of your pain. ? You may be asked to bend  forward, backward, and side to side to assess the severity of your pain and your range of motion.  Imaging tests, such as: ? X-rays. ? MRI.  How is this treated? Treatment for this condition may include:  Putting heat and cold on the affected area.  Medicines to help relieve pain and relax your muscles (muscle relaxants).  NSAIDs to help reduce swelling and discomfort.  When your symptoms improve, it is important to gradually return to your normal routine as soon as possible to reduce pain, avoid stiffness, and avoid loss of muscle strength. Generally, symptoms should improve within 6 weeks of treatment. However, recovery time varies. Follow these instructions at home: Managing pain, stiffness, and swelling   If directed, put ice on the injured area during the first 24 hours after your strain. ? Put ice in a plastic bag. ? Place a towel between your skin and the bag. ? Leave the ice on for 20 minutes, 2-3 times a day.  If directed, put heat on the affected area as often as told by your health care provider. Use the heat source that your health care provider recommends, such as a moist heat pack or a heating pad. ? Place a towel between your skin and the heat source. ? Leave the heat on for 20-30 minutes. ? Remove the heat if your skin turns bright red. This is especially important if you are unable to feel pain, heat, or cold. You may have a greater risk of getting burned. Activity  Rest and return to your normal activities as told by your health care provider. Ask your health care provider what activities are safe for you.  Avoid activities that take a lot of energy for as long as told by your health care provider. General instructions  Take over-the-counter and prescription medicines only as told by your health care provider.  Donot drive or use heavy machinery while taking prescription pain medicine.  Do not use any products that contain nicotine or tobacco, such as  cigarettes and e-cigarettes. If you need help quitting, ask your health care provider.  Keep all follow-up visits as told by your health care provider. This is important. How is this prevented?  Use correct form when playing sports and lifting heavy objects.  Use good posture when sitting and standing.  Maintain a healthy weight.  Sleep on a mattress with medium firmness to support your back.  Be safe and responsible while being active to avoid falls.  Do at least 150 minutes of moderate-intensity exercise each week, such as brisk walking or water aerobics. Try a form of exercise that takes stress off your back, such as swimming or stationary cycling.  Maintain physical fitness, including: ? Strength. ? Flexibility. ? Cardiovascular fitness. ? Endurance. Contact a health care provider if:  Your back pain does not improve after 6 weeks of treatment.  Your symptoms get worse. Get help right away if:  Your back pain is severe.  You cannot stand or walk.  You have difficulty controlling when you urinate or when  you have a bowel movement.  You feel nauseous or you vomit.  Your feet get very cold.  You have numbness, tingling, weakness, or problems using your arms or legs.  You develop any of the following: ? Shortness of breath. ? Dizziness. ? Pain in your legs. ? Weakness in your buttocks or legs. ? Discoloration of the skin on your toes or legs. This information is not intended to replace advice given to you by your health care provider. Make sure you discuss any questions you have with your health care provider. Document Released: 03/07/2005 Document Revised: 12/16/2015 Document Reviewed: 10/30/2015 Elsevier Interactive Patient Education  [employer].

## 2017-11-29 ENCOUNTER — Other Ambulatory Visit: Payer: Self-pay | Admitting: Family Medicine

## 2017-12-09 ENCOUNTER — Ambulatory Visit (INDEPENDENT_AMBULATORY_CARE_PROVIDER_SITE_OTHER): Payer: Medicare Other | Admitting: Family Medicine

## 2017-12-09 ENCOUNTER — Encounter: Payer: Self-pay | Admitting: Family Medicine

## 2017-12-09 VITALS — BP 162/80 | HR 72 | Temp 98.2°F | Resp 14 | Ht 61.0 in | Wt 113.0 lb

## 2017-12-09 DIAGNOSIS — R110 Nausea: Secondary | ICD-10-CM

## 2017-12-09 DIAGNOSIS — R5382 Chronic fatigue, unspecified: Secondary | ICD-10-CM

## 2017-12-09 DIAGNOSIS — F321 Major depressive disorder, single episode, moderate: Secondary | ICD-10-CM | POA: Diagnosis not present

## 2017-12-09 DIAGNOSIS — I200 Unstable angina: Secondary | ICD-10-CM | POA: Diagnosis not present

## 2017-12-09 DIAGNOSIS — M5416 Radiculopathy, lumbar region: Secondary | ICD-10-CM

## 2017-12-09 DIAGNOSIS — M5412 Radiculopathy, cervical region: Secondary | ICD-10-CM | POA: Diagnosis not present

## 2017-12-09 DIAGNOSIS — G250 Essential tremor: Secondary | ICD-10-CM | POA: Diagnosis not present

## 2017-12-09 NOTE — Progress Notes (Signed)
Subjective:    Patient ID: Brittany Kirk, female    DOB: 09-08-1945, 72 y.o.   MRN: [id number]  ++++++++++++++++++++++++++++++  Patient was recently admitted to the hospital.  I have copied relevant portions of the discharge summary and included them below for my reference:  Admit Date: 10/11/2017 Discharge Date: 10/12/2017  Primary Care Provider: Susy Frizzle, MD Primary Cardiologist: Dr. Gwenlyn Found, MD  Discharge Diagnoses    Active Problems:   Unstable angina [facility])  History of Present Illness     72 year old female with history of CAD medically managed as detailed below, emphysema, CKD stage III, HTN, HLD, breast cancer s/p lumpectomy, carotid artery disease, prior tobacco abuse for 25 pack years quitting ~ 3 years prior, strong family history of heart disease with multiple siblings who passed away at a premature age from MIs, and GERD who was admitted to [facility] with chest pain on 10/11/2017.   Patient has history of a remote cardiac cath by Dr. Lia Foyer, MD, in 09/2008, that showed moderate calcification in the coronary arteries, high-grade ostial ramus intermedius at trifurcation location involving LAD, ramus and the AV circumflex. Medical therapy was pursued as lesion was not ideal for percutaneous intervention given his ostial location andit's relationship to the LAD and the left circumflex interface. Most recent ischemic evaluation by [facility] on 01/05/2017 that was negative for ischemia, no EKG changes, EF 83%, low risk scan. Over the fall and winter months of 2018, she had an upper and lower endoscopy that were both unrevealing. She was seen in the office on 5/3 for evaluation of chest pain. At that time, she noted increasing SOB, substernal chest pain that radiated to her back and left upper extremity. Her EKG showed no acute changes. She was transported via EMS to [facility].   Hospital Course     Consultants: none  Upon her arrival to [facility], troponin was cycled and  she ruled out. CXR showed a mild opacity of the right base. She underwent LHC on 10/11/2017 that showed single vessel CAD involving the ostium of the ramus intermediate branch. Cardiac cath was unchanged from prior study in 2010. Given prior concern regarding shifting plaque into the LAD or LCx with PCI. Patient had not been on any antianginal therapy and was noted to be severely hypertensive in the cath lab. Aggressive medical therapy and BP control was advised. She was started on Coreg and Imdur. If she continues to have refractory angina despite optimal medical therapy and BP control, PCI of the ramus could be considered. Post cath labs showed stable renal function. In the evening of 5/3, the patient was hypotensive with systolic BP in the 33H to 80s. There were no complications from her cardiac cath site. PM Coreg was held. She was given IV saline bolus with improvement in BP. Initial BP this morning was soft in the 54T systolic. Coreg was held again. BP 125 mmHg currently. Patient has explained to Dr. Rayann Heman that she only wants to take her prior to admission medications upon discharge and she will not be sent home on any new medications. She has been advised to follow up with Dr. Gwenlyn Found for further medical management (message has been sent to our office).   The patient's right femoral cardiac cath site has been examined and is healing well without issues at this time. The patient has been seen by Dr. Rayann Heman, MD and felt to be stable for discharge today. All follow up appointments have been made.  Discharge medications are listed below. Prescriptions have been reviewed with the patient and sent in to their pharmacy.  _____________  10/28/17 Patient is here today complaining of pain going up and down her back, tremor which is constant, and feeling tired all the time.  I reviewed her past records and she is also had a CT scan of the chest abdomen and pelvis in March of this year which was reassuring.  Those  records are copied below: IMPRESSION: No findings specific for recurrent or metastatic disease.  Status post right axillary lymph node dissection.  Faint subpleural micronodularity measuring up to 3 mm, predominantly in the bilateral upper lobes. Consider follow-up CT chest in 6 months to confirm stability. However, this appearance is not worrisome for metastatic disease.  1.8 cm left adrenal nodule, only minimally increased from 2014, suggesting a benign adrenal adenoma.  Can now is clear  Last year, I had seen the patient for persistent nausea and weight loss.  Work-up at that time revealed no specific etiology.  My working diagnosis was most likely anxiety and stress.  Today the patient states that she has had burning stinging pain radiating from her neck to her tailbone and from her tailbone up to her neck now for several months.  She also reports worsening weakness in her arms and legs.  She reports worsening essential tremor.  This now involves her head and neck as well as both arms.  It worsens with activity and is present with rest.  She reports continued weight loss.  She reports feeling extremely tired and weak.  Patient saw neurology last year and had an MRI of the brain as well as cervical spine that was essentially normal.  Patient states that if I sent her to a neurologist she wants to second opinion.  At that time, my plan was: I spent approximately 30 minutes with the patient today conducting her history and her exam.  I have also spent a great deal of time reviewing her past medical records over the last several months including her referral to neurology, her follow-up with her oncologist, her recent admission to cardiology.  Given her unusual radicular almost neuropathic pain, I reviewed her records and found that she recently had a TSH in May of this year that was normal.  She had a vitamin B12 level checked in October 2018 that was normal.  She had an MRI in February 2018 of her  neck that was normal.  She has had a CT scan of the chest abdomen and pelvis that was normal or at least showed no specific cause of her symptoms.  As I explained to the patient, she has had numerous somatic complaints over the last 12 to 18 months with no specific finding on thorough diagnostic work-up.  I believe this could be related to anxiety, stress, and underlying depression.  Patient disagrees with this assessment.  She states that emotionally she feels fine.  Therefore I will proceed with an MRI of the cervical and lumbar spine to evaluate for any abnormalities that would explain her radicular pain and neuropathic pain that radiates from her neck to her tailbone and from her tailbone to her neck that may also cause her subjective weakness in her arms and legs.  Differential diagnosis includes multiple sclerosis.  Her essential tremor is certainly worsening however I will defer that at the present time until I have the results of her MRI.  If MRI shows no specific cause for symptoms, I will revisit  possible somatization disorder.    11/05/17 Patient called 5/21 and asked me to not order the MRI.  However the patient has an appointment already scheduled for Saturday.  She now states that she would like to keep that appointment.  She is here today continuing to complain of a burning heat warm sensation radiating from her neck to her tailbone and from her tailbone to her neck.  The pain, for lack of a better word, comes and goes with no specific cause and no specific alleviating factor.  She also continues to report nausea.  Nothing has helped her nausea.. Wt Readings from Last 3 Encounters:  11/19/17 114 lb 6.4 oz (51.9 kg)  11/05/17 115 lb (52.2 kg)  10/28/17 114 lb (51.7 kg)   Thankfully her weight has remained relatively stable over the last few weeks as evidenced above.  She denies any vomiting.  She has had an exhaustive GI work-up last year with no specific cause for her nausea.  As I explained  to the patient and her last visit, I believe some the symptoms could be conversion disorder essentially.  I wonder if stress and underlying depression may be causing a myriad of somatic complaints.  She does report depression.  She reports poor energy.  She reports fatigue.  She reports poor appetite.  She denies trouble sleeping.  She denies suicidal ideation.  She does report loss of interest/anhedonia.  She independently discontinued Lexapro due to perceived lack of benefit.   At that time, my plan was: I am not certain of the diagnosis but I am concerned that some of this may be conversion disorder and that some of her physical symptoms could be due to to underlying depression/stress/etc.  I am awaiting the results of the MRI but I have recommended starting the patient on Cymbalta 60 mg a day and rechecking in 4 weeks to see if her symptoms will improve.  I also believe there may be added benefit given some of the effect Cymbalta has been treating neuropathic pain.  Await the results of the MRI as previously scheduled.  12/09/17 Patient discontinued Lexapro and began Cymbalta 60 mg a day.  Patient states that the nausea has improved.  Her resting tremor has improved.  She still has very little appetite and has lost an additional 2 pounds since her last appointment.  She no longer has the neuropathic pain radiating from her head to her tailbone and from her tailbone to her head.  MRI of the cervical spine and lumbar spine revealed only mild spondylosis but no explanation for her symptoms.  Therefore I still believe we are dealing with conversion disorder secondary to uncontrolled depression and anxiety.  I explained this to the patient in length today.  She states that she feels that her depression has not improved since switching to Cymbalta.  She actually feels more depressed in the afternoons than previously.  She denies any panic attacks.  She denies any suicidal ideation. Past Medical History:  Diagnosis  Date  . Allergic rhinitis   . Anxiety   . Asymptomatic bilateral carotid artery stenosis   . CAD (coronary artery disease)   . Cancer ([place])   . Complication of anesthesia    sore throat after a surgery  . Depression   . Emphysema    no longer uses inhalers  . Gallstones    nausea, pain upper right abdomen  . GERD (gastroesophageal reflux disease)   . H/O hiatal hernia   . Headache   .  History of skin cancer   . Hyperlipidemia   . Hypertension   . Shortness of breath    sob if walks up flight of stairs  . Tobacco user   . Wears dentures    top   Past Surgical History:  Procedure Laterality Date  . BREAST LUMPECTOMY Right 2015  . CHOLECYSTECTOMY  07/08/2012   Procedure: LAPAROSCOPIC CHOLECYSTECTOMY WITH INTRAOPERATIVE CHOLANGIOGRAM;  Surgeon: Gayland Curry, MD,FACS;  Location: [facility];  Service: General;  Laterality: N/A;  Laparoscopic Cholecystectomy with Intraoperative Cholangiogram  . COLONOSCOPY    . ESOPHAGOSCOPY N/A 05/29/2017   Procedure: ESOPHAGOSCOPY;  Surgeon: Clarene Essex, MD;  Location: [facility];  Service: Endoscopy;  Laterality: N/A;  botox injection  . HAND SURGERY Left    wrist  . LEFT HEART CATH AND CORONARY ANGIOGRAPHY N/A 10/11/2017   Procedure: LEFT HEART CATH AND CORONARY ANGIOGRAPHY;  Surgeon: Martinique, Peter M, MD;  Location: [facility];  Service: Cardiovascular;  Laterality: N/A;  . TONSILECTOMY, ADENOIDECTOMY, BILATERAL MYRINGOTOMY AND TUBES    . TUBAL LIGATION     Current Outpatient Medications on File Prior to Visit  Medication Sig Dispense Refill  . anastrozole (ARIMIDEX) 1 MG tablet Take 1 tablet (1 mg total) by mouth daily. 90 tablet 3  . aspirin 81 MG tablet Take 81 mg by mouth daily.    Marland Kitchen atorvastatin (LIPITOR) 40 MG tablet TAKE 1 TABLET BY MOUTH EVERYDAY AT BEDTIME 90 tablet 1  . Cyanocobalamin (B-12) 2500 MCG TABS Take 2,500 mcg by mouth daily.    . cyclobenzaprine (FLEXERIL) 5 MG tablet Take 1-2 tablets (5-10 mg total) by mouth 3  (three) times daily as needed for muscle spasms. 30 tablet 1  . diazepam (VALIUM) 5 MG tablet Take 5 mg by mouth at bedtime  3  . DULoxetine (CYMBALTA) 60 MG capsule TAKE 1 CAPSULE BY MOUTH EVERY DAY 90 capsule 2  . ibuprofen (ADVIL,MOTRIN) 200 MG tablet Take 400 mg by mouth daily as needed for headache or moderate pain.    . Multiple Vitamin (MULTIVITAMIN) tablet Take 1 tablet by mouth daily.    Marland Kitchen omeprazole (PRILOSEC) 40 MG capsule Take 40 mg by mouth daily at 3 pm.    . ondansetron (ZOFRAN) 4 MG tablet Take 4 mg by mouth every 8 (eight) hours as needed for nausea/vomiting.     No current facility-administered medications on file prior to visit.    Allergies  Allergen Reactions  . Penicillins Anaphylaxis, Itching, Swelling and Rash    Has patient had a PCN reaction causing immediate rash, facial/tongue/throat swelling, SOB or lightheadedness with hypotension: Yes Has patient had a PCN reaction causing severe rash involving mucus membranes or skin necrosis: No Has patient had a PCN reaction that required hospitalization: Yes Has patient had a PCN reaction occurring within the last 10 years: No If all of the above answers are "NO", then may proceed with Cephalosporin use.   . Cefaclor Itching, Swelling and Rash  . Latex Itching and Rash  . Metronidazole Itching, Swelling and Rash  . Naproxen Itching, Swelling and Rash  . Nsaids Itching, Swelling and Rash    Ibuprofen IS tolerated  . Septra [Bactrim] Itching, Swelling and Rash  . Sulfonamide Derivatives Itching, Swelling and Rash  . Tape Itching and Rash   Social History   Socioeconomic History  . Marital status: Married    Spouse name: Not on file  . Number of children: 1  . Years of education: Not on file  .  Highest education level: Not on file  Occupational History  . Occupation: retired Clinical cytogeneticist: RETIRED  Social Needs  . Financial resource strain: Not on file  . Food insecurity:    Worry: Not on  file    Inability: Not on file  . Transportation needs:    Medical: Not on file    Non-medical: Not on file  Tobacco Use  . Smoking status: Former Smoker    Packs/day: 0.50    Years: 40.00    Pack years: 20.00    Types: Cigarettes    Last attempt to quit: 02/13/2011    Years since quitting: 6.8  . Smokeless tobacco: Never Used  Substance and Sexual Activity  . Alcohol use: No  . Drug use: No  . Sexual activity: Not on file  Lifestyle  . Physical activity:    Days per week: Not on file    Minutes per session: Not on file  . Stress: Not on file  Relationships  . Social connections:    Talks on phone: Not on file    Gets together: Not on file    Attends religious service: Not on file    Active member of club or organization: Not on file    Attends meetings of clubs or organizations: Not on file    Relationship status: Not on file  . Intimate partner violence:    Fear of current or ex partner: Not on file    Emotionally abused: Not on file    Physically abused: Not on file    Forced sexual activity: Not on file  Other Topics Concern  . Not on file  Social History Narrative  . Not on file     Review of Systems  All other systems reviewed and are negative.      Objective:   Physical Exam  Constitutional: She is oriented to person, place, and time. She appears well-developed and well-nourished. No distress.  HENT:  Head: Normocephalic and atraumatic.  Right Ear: External ear normal.  Left Ear: External ear normal.  Mouth/Throat: Oropharynx is clear and moist. No oropharyngeal exudate.  Eyes: Pupils are equal, round, and reactive to light. Conjunctivae and EOM are normal.  Neck: Normal range of motion. No JVD present. No thyromegaly present.  Cardiovascular: Normal rate, regular rhythm and normal heart sounds. Exam reveals no gallop and no friction rub.  No murmur heard. Pulmonary/Chest: Effort normal and breath sounds normal. No stridor. No respiratory distress. She  has no wheezes. She has no rales.  Abdominal: Soft. Bowel sounds are normal. She exhibits no distension. There is no tenderness. There is no guarding.  Lymphadenopathy:    She has no cervical adenopathy.  Neurological: She is alert and oriented to person, place, and time. She has normal strength and normal reflexes. She displays tremor. She displays no atrophy. No cranial nerve deficit or sensory deficit. She exhibits normal muscle tone. She displays a negative Romberg sign. She displays no seizure activity.  Skin: She is not diaphoretic.  Vitals reviewed.         Assessment & Plan:  Conversion disorder, chronic nausea, weight loss, chronic back pain  I am heartened by the fact the nausea has improved, the pain has improved, and the tremor has improved.  I feel we are on the right track.  Of asked the patient to allow the Cymbalta 2-4 more weeks to take full effect.  At that point if her depression is not any  better, I would consider switching the patient to Prozac as an appetite stimulant coupled with Wellbutrin in place of the Cymbalta.  However I would still focus on treating underlying anxiety and depression as a cause of her physical symptoms as stated earlier.

## 2018-01-01 ENCOUNTER — Other Ambulatory Visit: Payer: Self-pay | Admitting: Cardiovascular Disease

## 2018-01-01 DIAGNOSIS — I6523 Occlusion and stenosis of bilateral carotid arteries: Secondary | ICD-10-CM

## 2018-01-06 ENCOUNTER — Encounter: Payer: Self-pay | Admitting: Family Medicine

## 2018-01-06 ENCOUNTER — Ambulatory Visit (INDEPENDENT_AMBULATORY_CARE_PROVIDER_SITE_OTHER): Payer: Medicare Other | Admitting: Family Medicine

## 2018-01-06 VITALS — BP 144/80 | HR 70 | Temp 98.2°F | Resp 16 | Ht 61.0 in | Wt 114.0 lb

## 2018-01-06 DIAGNOSIS — F321 Major depressive disorder, single episode, moderate: Secondary | ICD-10-CM

## 2018-01-06 DIAGNOSIS — I200 Unstable angina: Secondary | ICD-10-CM

## 2018-01-06 NOTE — Progress Notes (Signed)
Subjective:    Patient ID: Brittany Kirk, female    DOB: 07/30/1945, 72 y.o.   MRN: [id number]  HPI Patient was recently admitted to the hospital.  I have copied relevant portions of the discharge summary and included them below for my reference:  Admit Date: 10/11/2017 Discharge Date: 10/12/2017  Primary Care Provider: Susy Frizzle, MD Primary Cardiologist: Dr. Gwenlyn Found, MD  Discharge Diagnoses    Active Problems:   Unstable angina [facility])  History of Present Illness     72 year old female with history of CAD medically managed as detailed below, emphysema, CKD stage III, HTN, HLD, breast cancer s/p lumpectomy, carotid artery disease, prior tobacco abuse for 25 pack years quitting ~ 3 years prior, strong family history of heart disease with multiple siblings who passed away at a premature age from MIs, and GERD who was admitted to [facility] with chest pain on 10/11/2017.   Patient has history of a remote cardiac cath by Dr. Lia Foyer, MD, in 09/2008, that showed moderate calcification in the coronary arteries, high-grade ostial ramus intermedius at trifurcation location involving LAD, ramus and the AV circumflex. Medical therapy was pursued as lesion was not ideal for percutaneous intervention given his ostial location andit's relationship to the LAD and the left circumflex interface. Most recent ischemic evaluation by [facility] on 01/05/2017 that was negative for ischemia, no EKG changes, EF 83%, low risk scan. Over the fall and winter months of 2018, she had an upper and lower endoscopy that were both unrevealing. She was seen in the office on 5/3 for evaluation of chest pain. At that time, she noted increasing SOB, substernal chest pain that radiated to her back and left upper extremity. Her EKG showed no acute changes. She was transported via EMS to [facility].   Hospital Course     Consultants: none  Upon her arrival to [facility], troponin was cycled and she ruled out. CXR showed a  mild opacity of the right base. She underwent LHC on 10/11/2017 that showed single vessel CAD involving the ostium of the ramus intermediate branch. Cardiac cath was unchanged from prior study in 2010. Given prior concern regarding shifting plaque into the LAD or LCx with PCI. Patient had not been on any antianginal therapy and was noted to be severely hypertensive in the cath lab. Aggressive medical therapy and BP control was advised. She was started on Coreg and Imdur. If she continues to have refractory angina despite optimal medical therapy and BP control, PCI of the ramus could be considered. Post cath labs showed stable renal function. In the evening of 5/3, the patient was hypotensive with systolic BP in the 54M to 80s. There were no complications from her cardiac cath site. PM Coreg was held. She was given IV saline bolus with improvement in BP. Initial BP this morning was soft in the 08Q systolic. Coreg was held again. BP 125 mmHg currently. Patient has explained to Dr. Rayann Heman that she only wants to take her prior to admission medications upon discharge and she will not be sent home on any new medications. She has been advised to follow up with Dr. Gwenlyn Found for further medical management (message has been sent to our office).   The patient's right femoral cardiac cath site has been examined and is healing well without issues at this time. The patient has been seen by Dr. Rayann Heman, MD and felt to be stable for discharge today. All follow up appointments have been made. Discharge  medications are listed below. Prescriptions have been reviewed with the patient and sent in to their pharmacy.  _____________  10/28/17 Patient is here today complaining of pain going up and down her back, tremor which is constant, and feeling tired all the time.  I reviewed her past records and she is also had a CT scan of the chest abdomen and pelvis in March of this year which was reassuring.  Those records are copied  below: IMPRESSION: No findings specific for recurrent or metastatic disease.  Status post right axillary lymph node dissection.  Faint subpleural micronodularity measuring up to 3 mm, predominantly in the bilateral upper lobes. Consider follow-up CT chest in 6 months to confirm stability. However, this appearance is not worrisome for metastatic disease.  1.8 cm left adrenal nodule, only minimally increased from 2014, suggesting a benign adrenal adenoma.  Can now is clear  Last year, I had seen the patient for persistent nausea and weight loss.  Work-up at that time revealed no specific etiology.  My working diagnosis was most likely anxiety and stress.  Today the patient states that she has had burning stinging pain radiating from her neck to her tailbone and from her tailbone up to her neck now for several months.  She also reports worsening weakness in her arms and legs.  She reports worsening essential tremor.  This now involves her head and neck as well as both arms.  It worsens with activity and is present with rest.  She reports continued weight loss.  She reports feeling extremely tired and weak.  Patient saw neurology last year and had an MRI of the brain as well as cervical spine that was essentially normal.  Patient states that if I sent her to a neurologist she wants to second opinion.  At that time, my plan was: I spent approximately 30 minutes with the patient today conducting her history and her exam.  I have also spent a great deal of time reviewing her past medical records over the last several months including her referral to neurology, her follow-up with her oncologist, her recent admission to cardiology.  Given her unusual radicular almost neuropathic pain, I reviewed her records and found that she recently had a TSH in May of this year that was normal.  She had a vitamin B12 level checked in October 2018 that was normal.  She had an MRI in February 2018 of her neck that was  normal.  She has had a CT scan of the chest abdomen and pelvis that was normal or at least showed no specific cause of her symptoms.  As I explained to the patient, she has had numerous somatic complaints over the last 12 to 18 months with no specific finding on thorough diagnostic work-up.  I believe this could be related to anxiety, stress, and underlying depression.  Patient disagrees with this assessment.  She states that emotionally she feels fine.  Therefore I will proceed with an MRI of the cervical and lumbar spine to evaluate for any abnormalities that would explain her radicular pain and neuropathic pain that radiates from her neck to her tailbone and from her tailbone to her neck that may also cause her subjective weakness in her arms and legs.  Differential diagnosis includes multiple sclerosis.  Her essential tremor is certainly worsening however I will defer that at the present time until I have the results of her MRI.  If MRI shows no specific cause for symptoms, I will revisit possible  somatization disorder.    11/05/17 Patient called 5/21 and asked me to not order the MRI.  However the patient has an appointment already scheduled for Saturday.  She now states that she would like to keep that appointment.  She is here today continuing to complain of a burning heat warm sensation radiating from her neck to her tailbone and from her tailbone to her neck.  The pain, for lack of a better word, comes and goes with no specific cause and no specific alleviating factor.  She also continues to report nausea.  Nothing has helped her nausea.. Wt Readings from Last 3 Encounters:  01/06/18 114 lb (51.7 kg)  12/09/17 113 lb (51.3 kg)  11/19/17 114 lb 6.4 oz (51.9 kg)   Thankfully her weight has remained relatively stable over the last few weeks as evidenced above.  She denies any vomiting.  She has had an exhaustive GI work-up last year with no specific cause for her nausea.  As I explained to the patient  and her last visit, I believe some the symptoms could be conversion disorder essentially.  I wonder if stress and underlying depression may be causing a myriad of somatic complaints.  She does report depression.  She reports poor energy.  She reports fatigue.  She reports poor appetite.  She denies trouble sleeping.  She denies suicidal ideation.  She does report loss of interest/anhedonia.  She independently discontinued Lexapro due to perceived lack of benefit.   At that time, my plan was: I am not certain of the diagnosis but I am concerned that some of this may be conversion disorder and that some of her physical symptoms could be due to to underlying depression/stress/etc.  I am awaiting the results of the MRI but I have recommended starting the patient on Cymbalta 60 mg a day and rechecking in 4 weeks to see if her symptoms will improve.  I also believe there may be added benefit given some of the effect Cymbalta has been treating neuropathic pain.  Await the results of the MRI as previously scheduled.  12/09/17 Patient discontinued Lexapro and began Cymbalta 60 mg a day.  Patient states that the nausea has improved.  Her resting tremor has improved.  She still has very little appetite and has lost an additional 2 pounds since her last appointment.  She no longer has the neuropathic pain radiating from her head to her tailbone and from her tailbone to her head.  MRI of the cervical spine and lumbar spine revealed only mild spondylosis but no explanation for her symptoms.  Therefore I still believe we are dealing with conversion disorder secondary to uncontrolled depression and anxiety.  I explained this to the patient in length today.  She states that she feels that her depression has not improved since switching to Cymbalta.  She actually feels more depressed in the afternoons than previously.  She denies any panic attacks.  She denies any suicidal ideation.  At that time, my plan was: I am heartened by  the fact the nausea has improved, the pain has improved, and the tremor has improved.  I feel we are on the right track.  Of asked the patient to allow the Cymbalta 2-4 more weeks to take full effect.  At that point if her depression is not any better, I would consider switching the patient to Prozac as an appetite stimulant coupled with Wellbutrin in place of the Cymbalta.  However I would still focus on treating underlying  anxiety and depression as a cause of her physical symptoms as stated earlier.  01/06/18 I am very happy to report that the patient feels much better.  Her depression is improving.  Furthermore her anxiety is improving.  She is taking less and less diazepam.  She has been attending a class at her church regarding depression and anxiety and she feels that this is been extremely beneficial to her being able to talk to other people who have experience what she is experiencing and know how she feels.  The anxiety is improving.  She is sleeping better.  She continues to deny any suicidal ideation.  She states that she feels much happier.  She is still battling depression but it has improved substantially.  She reports less anhedonia.  She reports more energy and better concentration Past Medical History:  Diagnosis Date  . Allergic rhinitis   . Anxiety   . Asymptomatic bilateral carotid artery stenosis   . CAD (coronary artery disease)   . Cancer ([place])   . Complication of anesthesia    sore throat after a surgery  . Depression   . Emphysema    no longer uses inhalers  . Gallstones    nausea, pain upper right abdomen  . GERD (gastroesophageal reflux disease)   . H/O hiatal hernia   . Headache   . History of skin cancer   . Hyperlipidemia   . Hypertension   . Shortness of breath    sob if walks up flight of stairs  . Tobacco user   . Wears dentures    top   Past Surgical History:  Procedure Laterality Date  . BREAST LUMPECTOMY Right 2015  . CHOLECYSTECTOMY  07/08/2012    Procedure: LAPAROSCOPIC CHOLECYSTECTOMY WITH INTRAOPERATIVE CHOLANGIOGRAM;  Surgeon: Gayland Curry, MD,FACS;  Location: [facility];  Service: General;  Laterality: N/A;  Laparoscopic Cholecystectomy with Intraoperative Cholangiogram  . COLONOSCOPY    . ESOPHAGOSCOPY N/A 05/29/2017   Procedure: ESOPHAGOSCOPY;  Surgeon: Clarene Essex, MD;  Location: [facility];  Service: Endoscopy;  Laterality: N/A;  botox injection  . HAND SURGERY Left    wrist  . LEFT HEART CATH AND CORONARY ANGIOGRAPHY N/A 10/11/2017   Procedure: LEFT HEART CATH AND CORONARY ANGIOGRAPHY;  Surgeon: Martinique, Peter M, MD;  Location: [place] [facility];  Service: Cardiovascular;  Laterality: N/A;  . TONSILECTOMY, ADENOIDECTOMY, BILATERAL MYRINGOTOMY AND TUBES    . TUBAL LIGATION     Current Outpatient Medications on File Prior to Visit  Medication Sig Dispense Refill  . anastrozole (ARIMIDEX) 1 MG tablet Take 1 tablet (1 mg total) by mouth daily. 90 tablet 3  . aspirin 81 MG tablet Take 81 mg by mouth daily.    Marland Kitchen atorvastatin (LIPITOR) 40 MG tablet TAKE 1 TABLET BY MOUTH EVERYDAY AT BEDTIME 90 tablet 1  . Cyanocobalamin (B-12) 2500 MCG TABS Take 2,500 mcg by mouth daily.    . cyclobenzaprine (FLEXERIL) 5 MG tablet Take 1-2 tablets (5-10 mg total) by mouth 3 (three) times daily as needed for muscle spasms. 30 tablet 1  . diazepam (VALIUM) 5 MG tablet Take 5 mg by mouth at bedtime  3  . DULoxetine (CYMBALTA) 60 MG capsule TAKE 1 CAPSULE BY MOUTH EVERY DAY 90 capsule 2  . ibuprofen (ADVIL,MOTRIN) 200 MG tablet Take 400 mg by mouth daily as needed for headache or moderate pain.    . Multiple Vitamin (MULTIVITAMIN) tablet Take 1 tablet by mouth daily.    Marland Kitchen omeprazole (PRILOSEC) 40  MG capsule Take 40 mg by mouth daily at 3 pm.    . ondansetron (ZOFRAN) 4 MG tablet Take 4 mg by mouth every 8 (eight) hours as needed for nausea/vomiting.     No current facility-administered medications on file prior to visit.    Allergies  Allergen  Reactions  . Penicillins Anaphylaxis, Itching, Swelling and Rash    Has patient had a PCN reaction causing immediate rash, facial/tongue/throat swelling, SOB or lightheadedness with hypotension: Yes Has patient had a PCN reaction causing severe rash involving mucus membranes or skin necrosis: No Has patient had a PCN reaction that required hospitalization: Yes Has patient had a PCN reaction occurring within the last 10 years: No If all of the above answers are "NO", then may proceed with Cephalosporin use.   . Cefaclor Itching, Swelling and Rash  . Latex Itching and Rash  . Metronidazole Itching, Swelling and Rash  . Naproxen Itching, Swelling and Rash  . Nsaids Itching, Swelling and Rash    Ibuprofen IS tolerated  . Septra [Bactrim] Itching, Swelling and Rash  . Sulfonamide Derivatives Itching, Swelling and Rash  . Tape Itching and Rash   Social History   Socioeconomic History  . Marital status: Married    Spouse name: Not on file  . Number of children: 1  . Years of education: Not on file  . Highest education level: Not on file  Occupational History  . Occupation: retired Clinical cytogeneticist: RETIRED  Social Needs  . Financial resource strain: Not on file  . Food insecurity:    Worry: Not on file    Inability: Not on file  . Transportation needs:    Medical: Not on file    Non-medical: Not on file  Tobacco Use  . Smoking status: Former Smoker    Packs/day: 0.50    Years: 40.00    Pack years: 20.00    Types: Cigarettes    Last attempt to quit: 02/13/2011    Years since quitting: 6.9  . Smokeless tobacco: Never Used  Substance and Sexual Activity  . Alcohol use: No  . Drug use: No  . Sexual activity: Not on file  Lifestyle  . Physical activity:    Days per week: Not on file    Minutes per session: Not on file  . Stress: Not on file  Relationships  . Social connections:    Talks on phone: Not on file    Gets together: Not on file    Attends  religious service: Not on file    Active member of club or organization: Not on file    Attends meetings of clubs or organizations: Not on file    Relationship status: Not on file  . Intimate partner violence:    Fear of current or ex partner: Not on file    Emotionally abused: Not on file    Physically abused: Not on file    Forced sexual activity: Not on file  Other Topics Concern  . Not on file  Social History Narrative  . Not on file     Review of Systems  All other systems reviewed and are negative.      Objective:   Physical Exam  Constitutional: She is oriented to person, place, and time. She appears well-developed and well-nourished. No distress.  HENT:  Head: Normocephalic and atraumatic.  Right Ear: External ear normal.  Left Ear: External ear normal.  Mouth/Throat: Oropharynx is clear and moist. No  oropharyngeal exudate.  Eyes: Pupils are equal, round, and reactive to light. Conjunctivae and EOM are normal.  Neck: Normal range of motion. No JVD present. No thyromegaly present.  Cardiovascular: Normal rate, regular rhythm and normal heart sounds. Exam reveals no gallop and no friction rub.  No murmur heard. Pulmonary/Chest: Effort normal and breath sounds normal. No stridor. No respiratory distress. She has no wheezes. She has no rales.  Abdominal: Soft. Bowel sounds are normal. She exhibits no distension. There is no tenderness. There is no guarding.  Lymphadenopathy:    She has no cervical adenopathy.  Neurological: She is alert and oriented to person, place, and time. She has normal strength and normal reflexes. She displays tremor. She displays no atrophy. No cranial nerve deficit or sensory deficit. She exhibits normal muscle tone. She displays a negative Romberg sign. She displays no seizure activity.  Skin: She is not diaphoretic.  Vitals reviewed.         Assessment & Plan:  Depression, major, single episode, moderate ([place])    I am so happy that  the patient is benefiting from treatment.  Continue Cymbalta 60 mg a day.  We did discuss starting a low-dose Klonopin 0.5 mg p.o. twice daily on a scheduled basis to try to help calm anxiety.  Patient is actually feeling better and wants to avoid additional medication and continue on her current treatment regimen for the present time.  I believe this is completely appropriate.  Recheck the patient in 3 to 6 months or as needed.

## 2018-01-27 ENCOUNTER — Inpatient Hospital Stay (HOSPITAL_COMMUNITY): Admission: RE | Admit: 2018-01-27 | Payer: Medicare Other | Source: Ambulatory Visit

## 2018-01-27 ENCOUNTER — Ambulatory Visit: Payer: Medicare Other | Admitting: Cardiology

## 2018-01-27 ENCOUNTER — Other Ambulatory Visit: Payer: Self-pay | Admitting: Oncology

## 2018-01-27 DIAGNOSIS — Z9889 Other specified postprocedural states: Secondary | ICD-10-CM

## 2018-02-07 ENCOUNTER — Ambulatory Visit
Admission: RE | Admit: 2018-02-07 | Discharge: 2018-02-07 | Disposition: A | Discharge status: Home / Self Care | Payer: Medicare Other | Source: Ambulatory Visit | Attending: Oncology | Admitting: Oncology

## 2018-02-07 DIAGNOSIS — R928 Other abnormal and inconclusive findings on diagnostic imaging of breast: Secondary | ICD-10-CM | POA: Diagnosis not present

## 2018-02-07 DIAGNOSIS — Z9889 Other specified postprocedural states: Secondary | ICD-10-CM

## 2018-02-07 HISTORY — DX: Malignant neoplasm of unspecified site of unspecified female breast: C50.919

## 2018-02-14 ENCOUNTER — Ambulatory Visit (HOSPITAL_COMMUNITY)
Admission: RE | Admit: 2018-02-14 | Discharge: 2018-02-14 | Disposition: A | Discharge status: Home / Self Care | Payer: Medicare Other | Source: Ambulatory Visit | Attending: Cardiovascular Disease | Admitting: Cardiovascular Disease

## 2018-02-14 ENCOUNTER — Encounter: Payer: Self-pay | Admitting: Cardiology

## 2018-02-14 ENCOUNTER — Other Ambulatory Visit: Payer: Self-pay | Admitting: *Deleted

## 2018-02-14 ENCOUNTER — Ambulatory Visit (INDEPENDENT_AMBULATORY_CARE_PROVIDER_SITE_OTHER): Payer: Medicare Other | Admitting: Cardiology

## 2018-02-14 VITALS — BP 190/80 | HR 67 | Ht 61.0 in | Wt 112.0 lb

## 2018-02-14 DIAGNOSIS — I6523 Occlusion and stenosis of bilateral carotid arteries: Secondary | ICD-10-CM | POA: Diagnosis not present

## 2018-02-14 DIAGNOSIS — Z8659 Personal history of other mental and behavioral disorders: Secondary | ICD-10-CM

## 2018-02-14 DIAGNOSIS — I10 Essential (primary) hypertension: Secondary | ICD-10-CM | POA: Insufficient documentation

## 2018-02-14 DIAGNOSIS — I2510 Atherosclerotic heart disease of native coronary artery without angina pectoris: Secondary | ICD-10-CM | POA: Diagnosis not present

## 2018-02-14 DIAGNOSIS — Z853 Personal history of malignant neoplasm of breast: Secondary | ICD-10-CM | POA: Insufficient documentation

## 2018-02-14 DIAGNOSIS — E785 Hyperlipidemia, unspecified: Secondary | ICD-10-CM | POA: Diagnosis not present

## 2018-02-14 DIAGNOSIS — I200 Unstable angina: Secondary | ICD-10-CM

## 2018-02-14 MED ORDER — LOSARTAN POTASSIUM 50 MG PO TABS: 50.0000 mg | ORAL_TABLET | Freq: Every day | ORAL | 3 refills | Status: DC

## 2018-02-14 NOTE — Assessment & Plan Note (Signed)
On Cymbalta which can have some effect on her B/P- I would not change this now based on that.

## 2018-02-14 NOTE — Progress Notes (Signed)
02/14/2018 Brittany Kirk   [birth year]  [id number]  Primary Physician Dennard Schaumann Cammie Mcgee, MD Primary Cardiologist: Dr Gwenlyn Found  HPI:  72 y/o female followed by Dr Gwenlyn Found with a history of minor CAD, HTN, HLD, and carotid disease. She came to the office today for carotid dopplers. Her B/P was noted to be [id number] systolic. The pt tells me she does not check her B/P at home. She has had a headache but denies blurred vision, chest pain, or dyspnea.  On her last few office visits her B/P has been acceptable- on no antihypertensives. She denies any OTC medication use and says she is not unusually anxious. She has been placed on Cymbalta for depression by her PCP, this was back in July.    Current Outpatient Medications  Medication Sig Dispense Refill  . anastrozole (ARIMIDEX) 1 MG tablet Take 1 tablet (1 mg total) by mouth daily. 90 tablet 3  . aspirin 81 MG tablet Take 81 mg by mouth daily.    Marland Kitchen atorvastatin (LIPITOR) 40 MG tablet TAKE 1 TABLET BY MOUTH EVERYDAY AT BEDTIME 90 tablet 1  . Cyanocobalamin (B-12) 2500 MCG TABS Take 2,500 mcg by mouth daily.    . diazepam (VALIUM) 5 MG tablet Take 5 mg by mouth at bedtime  3  . DULoxetine (CYMBALTA) 60 MG capsule TAKE 1 CAPSULE BY MOUTH EVERY DAY 90 capsule 2  . ibuprofen (ADVIL,MOTRIN) 200 MG tablet Take 400 mg by mouth daily as needed for headache or moderate pain.    . Multiple Vitamin (MULTIVITAMIN) tablet Take 1 tablet by mouth daily.    Marland Kitchen omeprazole (PRILOSEC) 40 MG capsule Take 40 mg by mouth daily at 3 pm.    . losartan (COZAAR) 50 MG tablet Take 1 tablet (50 mg total) by mouth daily. 30 tablet 3   No current facility-administered medications for this visit.     Allergies  Allergen Reactions  . Penicillins Anaphylaxis, Itching, Swelling and Rash    Has patient had a PCN reaction causing immediate rash, facial/tongue/throat swelling, SOB or lightheadedness with hypotension: Yes Has patient had a PCN reaction causing severe rash involving  mucus membranes or skin necrosis: No Has patient had a PCN reaction that required hospitalization: Yes Has patient had a PCN reaction occurring within the last 10 years: No If all of the above answers are "NO", then may proceed with Cephalosporin use.   . Cefaclor Itching, Swelling and Rash  . Latex Itching and Rash  . Metronidazole Itching, Swelling and Rash  . Naproxen Itching, Swelling and Rash  . Nsaids Itching, Swelling and Rash    Ibuprofen IS tolerated  . Septra [Bactrim] Itching, Swelling and Rash  . Sulfonamide Derivatives Itching, Swelling and Rash  . Tape Itching and Rash    Past Medical History:  Diagnosis Date  . Allergic rhinitis   . Anxiety   . Asymptomatic bilateral carotid artery stenosis   . Breast cancer ([place]) 2015   Right Breast Cancer  . CAD (coronary artery disease)   . Cancer ([place])   . Complication of anesthesia    sore throat after a surgery  . Depression   . Depression   . Emphysema    no longer uses inhalers  . Gallstones    nausea, pain upper right abdomen  . GERD (gastroesophageal reflux disease)   . H/O hiatal hernia   . Headache   . History of skin cancer   . Hyperlipidemia   . Hypertension   . Shortness  of breath    sob if walks up flight of stairs  . Tobacco user   . Wears dentures    top    Social History   Socioeconomic History  . Marital status: Married    Spouse name: Not on file  . Number of children: 1  . Years of education: Not on file  . Highest education level: Not on file  Occupational History  . Occupation: retired Clinical cytogeneticist: RETIRED  Social Needs  . Financial resource strain: Not on file  . Food insecurity:    Worry: Not on file    Inability: Not on file  . Transportation needs:    Medical: Not on file    Non-medical: Not on file  Tobacco Use  . Smoking status: Former Smoker    Packs/day: 0.50    Years: 40.00    Pack years: 20.00    Types: Cigarettes    Last attempt to quit:  02/13/2011    Years since quitting: 7.0  . Smokeless tobacco: Never Used  Substance and Sexual Activity  . Alcohol use: No  . Drug use: No  . Sexual activity: Not on file  Lifestyle  . Physical activity:    Days per week: Not on file    Minutes per session: Not on file  . Stress: Not on file  Relationships  . Social connections:    Talks on phone: Not on file    Gets together: Not on file    Attends religious service: Not on file    Active member of club or organization: Not on file    Attends meetings of clubs or organizations: Not on file    Relationship status: Not on file  . Intimate partner violence:    Fear of current or ex partner: Not on file    Emotionally abused: Not on file    Physically abused: Not on file    Forced sexual activity: Not on file  Other Topics Concern  . Not on file  Social History Narrative  . Not on file     Family History  Problem Relation Age of Onset  . Arthritis Mother   . Heart disease Unknown        5 brothers and 2 sister  . Diverticulitis Sister   . Aplastic anemia Other      Review of Systems: General: negative for chills, fever, night sweats or weight changes.  Cardiovascular: negative for chest pain, dyspnea on exertion, edema, orthopnea, palpitations, paroxysmal nocturnal dyspnea or shortness of breath Dermatological: negative for rash Respiratory: negative for cough or wheezing Urologic: negative for hematuria Abdominal: negative for nausea, vomiting, diarrhea, bright red blood per rectum, melena, or hematemesis Neurologic: negative for visual changes, syncope, or dizziness All other systems reviewed and are otherwise negative except as noted above.    Blood pressure (!) 212/83, pulse 67, height 5\' 1"  (1.549 m), weight 112 lb (50.8 kg), SpO2 100 %.  General appearance: alert, cooperative and no distress Neck: no JVD and bilateral carotid bruit Lungs: clear to auscultation bilaterally Heart: regular rate and rhythm and S4  present Extremities: no edema Skin: cool and dry Neurologic: Grossly normal   ASSESSMENT AND PLAN:   CAD in native artery 35% RCA, 40% LAD, 85% small RI- medical Rx May 2019  Accelerated hypertension I suggested she consider going to the ED to have this treated but she declined. She tells me this has happened before and when she was  treated in the ED her B/P "bottomed out".  She was on Losartan a couple of years ago, she is not sure why this was stopped but she says her B/P was controlled on it.   Dyslipidemia, goal LDL below 70 LDL 41 May 2019   History of depression On Cymbalta which can have some effect on her B/P- I would not change this now based on that.  History of breast cancer S/P lumpectomy and lymph node removal Rt arm   PLAN  Repeat B/P by 190/80. I offered to send her to the ED but she declined. I suggested we resume Losartan 50 mg daily and she go home and rest quietly. She should also take one of her Diazepam 5 mg. She knows to go to the ED if she develops chest pain, SOB, or blurred vision. She will have her B/P checked Monday at Dr Samella Parr office. She needs a f/u BMP and OV with Dr Gwenlyn Found in 4-6 weeks. Doppler results are pending.   Kerin Ransom PA-C 02/14/2018 1:58 PM

## 2018-02-14 NOTE — Assessment & Plan Note (Signed)
S/P lumpectomy and lymph node removal Rt arm 

## 2018-02-14 NOTE — Patient Instructions (Signed)
Medication Instructions:  START Losartan 50mg  take 1 tablet starting today  If you need a refill on your cardiac medications before your next appointment, please call your pharmacy.  Labwork: None   Testing/Procedures: None   Follow-Up: Your physician recommends that you schedule a follow-up appointment in: 6 WEEKS with DR BERRY.  Any Other Special Instructions Will Be Listed Below (If Applicable). CONTACT OUR OFFICE AND GIVE [place] THE READING AFTER HAVING YOUR BLOOD PRESSURE CHECKED

## 2018-02-14 NOTE — Assessment & Plan Note (Signed)
I suggested she consider going to the ED to have this treated but she declined. She tells me this has happened before and when she was treated in the ED her B/P "bottomed out".  She was on Losartan a couple of years ago, she is not sure why this was stopped but she says her B/P was controlled on it.

## 2018-02-14 NOTE — Assessment & Plan Note (Signed)
LDL 41 May 2019

## 2018-02-14 NOTE — Assessment & Plan Note (Signed)
35% RCA, 40% LAD, 85% small RI- medical Rx May 2019

## 2018-02-17 ENCOUNTER — Ambulatory Visit (INDEPENDENT_AMBULATORY_CARE_PROVIDER_SITE_OTHER): Payer: Medicare Other | Admitting: Family Medicine

## 2018-02-17 ENCOUNTER — Telehealth: Payer: Self-pay | Admitting: Oncology

## 2018-02-17 ENCOUNTER — Ambulatory Visit: Payer: Medicare Other | Admitting: Oncology

## 2018-02-17 ENCOUNTER — Other Ambulatory Visit: Payer: Medicare Other

## 2018-02-17 ENCOUNTER — Encounter: Payer: Self-pay | Admitting: Family Medicine

## 2018-02-17 ENCOUNTER — Ambulatory Visit: Payer: Medicare Other

## 2018-02-17 VITALS — BP 190/82 | HR 78 | Temp 98.0°F | Resp 18 | Wt 113.6 lb

## 2018-02-17 DIAGNOSIS — I6523 Occlusion and stenosis of bilateral carotid arteries: Secondary | ICD-10-CM | POA: Insufficient documentation

## 2018-02-17 DIAGNOSIS — R42 Dizziness and giddiness: Secondary | ICD-10-CM | POA: Diagnosis not present

## 2018-02-17 DIAGNOSIS — I160 Hypertensive urgency: Secondary | ICD-10-CM

## 2018-02-17 DIAGNOSIS — I200 Unstable angina: Secondary | ICD-10-CM

## 2018-02-17 LAB — CBC WITH DIFFERENTIAL/PLATELET
Basophils Absolute: 30 cells/uL (ref 0–200)
Basophils Relative: 0.4 %
Eosinophils Absolute: 30 cells/uL (ref 15–500)
Eosinophils Relative: 0.4 %
HCT: 40.5 % (ref 35.0–45.0)
Hemoglobin: 14 g/dL (ref 11.7–15.5)
Lymphs Abs: 2079 cells/uL (ref 850–3900)
MCH: 32.9 pg (ref 27.0–33.0)
MCHC: 34.6 g/dL (ref 32.0–36.0)
MCV: 95.1 fL (ref 80.0–100.0)
MPV: 10.7 fL (ref 7.5–12.5)
Monocytes Relative: 5.8 %
Neutro Abs: 4832 cells/uL (ref 1500–7800)
Neutrophils Relative %: 65.3 %
Platelets: 261 10*3/uL (ref 140–400)
RBC: 4.26 10*6/uL (ref 3.80–5.10)
RDW: 12.7 % (ref 11.0–15.0)
Total Lymphocyte: 28.1 %
WBC mixed population: 429 cells/uL (ref 200–950)
WBC: 7.4 10*3/uL (ref 3.8–10.8)

## 2018-02-17 LAB — BASIC METABOLIC PANEL WITH GFR
BUN: 19 mg/dL (ref 7–25)
CO2: 28 mmol/L (ref 20–32)
Calcium: 9.2 mg/dL (ref 8.6–10.4)
Chloride: 106 mmol/L (ref 98–110)
Creat: 0.93 mg/dL (ref 0.60–0.93)
GFR, Est African American: 70 mL/min/{1.73_m2} (ref >=60)
GFR, Est Non African American: 61 mL/min/{1.73_m2} (ref >=60)
Glucose, Bld: 105 mg/dL — ABNORMAL HIGH (ref 65–99)
Potassium: 4.6 mmol/L (ref 3.5–5.3)
Sodium: 140 mmol/L (ref 135–146)

## 2018-02-17 MED ORDER — HYDROCHLOROTHIAZIDE 25 MG PO TABS: 25.0000 mg | ORAL_TABLET | Freq: Every day | ORAL | 3 refills | Status: DC

## 2018-02-17 NOTE — Telephone Encounter (Signed)
[facility] PAL - moved 9/16 lab/fu to 9/20 w/LC. Left message for patient. Schedule mailed.

## 2018-02-17 NOTE — Progress Notes (Signed)
Subjective:    Patient ID: Brittany Kirk, female    DOB: 07/30/1945, 72 y.o.   MRN: [id number]  HPI Patient was recently admitted to the hospital.  I have copied relevant portions of the discharge summary and included them below for my reference:  Admit Date: 10/11/2017 Discharge Date: 10/12/2017  Primary Care Provider: Susy Frizzle, MD Primary Cardiologist: Dr. Gwenlyn Found, MD  Discharge Diagnoses    Active Problems:   Unstable angina [facility])  History of Present Illness     72 year old female with history of CAD medically managed as detailed below, emphysema, CKD stage III, HTN, HLD, breast cancer s/p lumpectomy, carotid artery disease, prior tobacco abuse for 25 pack years quitting ~ 3 years prior, strong family history of heart disease with multiple siblings who passed away at a premature age from MIs, and GERD who was admitted to [facility] with chest pain on 10/11/2017.   Patient has history of a remote cardiac cath by Dr. Lia Foyer, MD, in 09/2008, that showed moderate calcification in the coronary arteries, high-grade ostial ramus intermedius at trifurcation location involving LAD, ramus and the AV circumflex. Medical therapy was pursued as lesion was not ideal for percutaneous intervention given his ostial location andit's relationship to the LAD and the left circumflex interface. Most recent ischemic evaluation by [facility] on 01/05/2017 that was negative for ischemia, no EKG changes, EF 83%, low risk scan. Over the fall and winter months of 2018, she had an upper and lower endoscopy that were both unrevealing. She was seen in the office on 5/3 for evaluation of chest pain. At that time, she noted increasing SOB, substernal chest pain that radiated to her back and left upper extremity. Her EKG showed no acute changes. She was transported via EMS to [facility].   Hospital Course     Consultants: none  Upon her arrival to [facility], troponin was cycled and she ruled out. CXR showed a  mild opacity of the right base. She underwent LHC on 10/11/2017 that showed single vessel CAD involving the ostium of the ramus intermediate branch. Cardiac cath was unchanged from prior study in 2010. Given prior concern regarding shifting plaque into the LAD or LCx with PCI. Patient had not been on any antianginal therapy and was noted to be severely hypertensive in the cath lab. Aggressive medical therapy and BP control was advised. She was started on Coreg and Imdur. If she continues to have refractory angina despite optimal medical therapy and BP control, PCI of the ramus could be considered. Post cath labs showed stable renal function. In the evening of 5/3, the patient was hypotensive with systolic BP in the 54M to 80s. There were no complications from her cardiac cath site. PM Coreg was held. She was given IV saline bolus with improvement in BP. Initial BP this morning was soft in the 08Q systolic. Coreg was held again. BP 125 mmHg currently. Patient has explained to Dr. Rayann Heman that she only wants to take her prior to admission medications upon discharge and she will not be sent home on any new medications. She has been advised to follow up with Dr. Gwenlyn Found for further medical management (message has been sent to our office).   The patient's right femoral cardiac cath site has been examined and is healing well without issues at this time. The patient has been seen by Dr. Rayann Heman, MD and felt to be stable for discharge today. All follow up appointments have been made. Discharge  medications are listed below. Prescriptions have been reviewed with the patient and sent in to their pharmacy.  _____________  10/28/17 Patient is here today complaining of pain going up and down her back, tremor which is constant, and feeling tired all the time.  I reviewed her past records and she is also had a CT scan of the chest abdomen and pelvis in March of this year which was reassuring.  Those records are copied  below: IMPRESSION: No findings specific for recurrent or metastatic disease.  Status post right axillary lymph node dissection.  Faint subpleural micronodularity measuring up to 3 mm, predominantly in the bilateral upper lobes. Consider follow-up CT chest in 6 months to confirm stability. However, this appearance is not worrisome for metastatic disease.  1.8 cm left adrenal nodule, only minimally increased from 2014, suggesting a benign adrenal adenoma.  Can now is clear  Last year, I had seen the patient for persistent nausea and weight loss.  Work-up at that time revealed no specific etiology.  My working diagnosis was most likely anxiety and stress.  Today the patient states that she has had burning stinging pain radiating from her neck to her tailbone and from her tailbone up to her neck now for several months.  She also reports worsening weakness in her arms and legs.  She reports worsening essential tremor.  This now involves her head and neck as well as both arms.  It worsens with activity and is present with rest.  She reports continued weight loss.  She reports feeling extremely tired and weak.  Patient saw neurology last year and had an MRI of the brain as well as cervical spine that was essentially normal.  Patient states that if I sent her to a neurologist she wants to second opinion.  At that time, my plan was: I spent approximately 30 minutes with the patient today conducting her history and her exam.  I have also spent a great deal of time reviewing her past medical records over the last several months including her referral to neurology, her follow-up with her oncologist, her recent admission to cardiology.  Given her unusual radicular almost neuropathic pain, I reviewed her records and found that she recently had a TSH in May of this year that was normal.  She had a vitamin B12 level checked in October 2018 that was normal.  She had an MRI in February 2018 of her neck that was  normal.  She has had a CT scan of the chest abdomen and pelvis that was normal or at least showed no specific cause of her symptoms.  As I explained to the patient, she has had numerous somatic complaints over the last 12 to 18 months with no specific finding on thorough diagnostic work-up.  I believe this could be related to anxiety, stress, and underlying depression.  Patient disagrees with this assessment.  She states that emotionally she feels fine.  Therefore I will proceed with an MRI of the cervical and lumbar spine to evaluate for any abnormalities that would explain her radicular pain and neuropathic pain that radiates from her neck to her tailbone and from her tailbone to her neck that may also cause her subjective weakness in her arms and legs.  Differential diagnosis includes multiple sclerosis.  Her essential tremor is certainly worsening however I will defer that at the present time until I have the results of her MRI.  If MRI shows no specific cause for symptoms, I will revisit possible  somatization disorder.    11/05/17 Patient called 5/21 and asked me to not order the MRI.  However the patient has an appointment already scheduled for Saturday.  She now states that she would like to keep that appointment.  She is here today continuing to complain of a burning heat warm sensation radiating from her neck to her tailbone and from her tailbone to her neck.  The pain, for lack of a better word, comes and goes with no specific cause and no specific alleviating factor.  She also continues to report nausea.  Nothing has helped her nausea.. Wt Readings from Last 3 Encounters:  02/17/18 113 lb 9.6 oz (51.5 kg)  02/14/18 112 lb (50.8 kg)  01/06/18 114 lb (51.7 kg)   Thankfully her weight has remained relatively stable over the last few weeks as evidenced above.  She denies any vomiting.  She has had an exhaustive GI work-up last year with no specific cause for her nausea.  As I explained to the patient  and her last visit, I believe some the symptoms could be conversion disorder essentially.  I wonder if stress and underlying depression may be causing a myriad of somatic complaints.  She does report depression.  She reports poor energy.  She reports fatigue.  She reports poor appetite.  She denies trouble sleeping.  She denies suicidal ideation.  She does report loss of interest/anhedonia.  She independently discontinued Lexapro due to perceived lack of benefit.   At that time, my plan was: I am not certain of the diagnosis but I am concerned that some of this may be conversion disorder and that some of her physical symptoms could be due to to underlying depression/stress/etc.  I am awaiting the results of the MRI but I have recommended starting the patient on Cymbalta 60 mg a day and rechecking in 4 weeks to see if her symptoms will improve.  I also believe there may be added benefit given some of the effect Cymbalta has been treating neuropathic pain.  Await the results of the MRI as previously scheduled.  12/09/17 Patient discontinued Lexapro and began Cymbalta 60 mg a day.  Patient states that the nausea has improved.  Her resting tremor has improved.  She still has very little appetite and has lost an additional 2 pounds since her last appointment.  She no longer has the neuropathic pain radiating from her head to her tailbone and from her tailbone to her head.  MRI of the cervical spine and lumbar spine revealed only mild spondylosis but no explanation for her symptoms.  Therefore I still believe we are dealing with conversion disorder secondary to uncontrolled depression and anxiety.  I explained this to the patient in length today.  She states that she feels that her depression has not improved since switching to Cymbalta.  She actually feels more depressed in the afternoons than previously.  She denies any panic attacks.  She denies any suicidal ideation.  At that time, my plan was: I am heartened by  the fact the nausea has improved, the pain has improved, and the tremor has improved.  I feel we are on the right track.  Of asked the patient to allow the Cymbalta 2-4 more weeks to take full effect.  At that point if her depression is not any better, I would consider switching the patient to Prozac as an appetite stimulant coupled with Wellbutrin in place of the Cymbalta.  However I would still focus on treating underlying  anxiety and depression as a cause of her physical symptoms as stated earlier.  01/06/18 I am very happy to report that the patient feels much better.  Her depression is improving.  Furthermore her anxiety is improving.  She is taking less and less diazepam.  She has been attending a class at her church regarding depression and anxiety and she feels that this is been extremely beneficial to her being able to talk to other people who have experience what she is experiencing and know how she feels.  The anxiety is improving.  She is sleeping better.  She continues to deny any suicidal ideation.  She states that she feels much happier.  She is still battling depression but it has improved substantially.  She reports less anhedonia.  She reports more energy and better concentration.  At that time, my plan was: I am so happy that the patient is benefiting from treatment.  Continue Cymbalta 60 mg a day.  We did discuss starting a low-dose Klonopin 0.5 mg p.o. twice daily on a scheduled basis to try to help calm anxiety.  Patient is actually feeling better and wants to avoid additional medication and continue on her current treatment regimen for the present time.  I believe this is completely appropriate.  Recheck the patient in 3 to 6 months or as needed.  02/17/18 Saw Cardiology on 02/14/18.  BP was 190/80.  Reviewed their note.  Losartan 50 mg poqday was resumed.  She walked in this morning complaining of dizziness and headache asking for her BP to be checked.  Initially 190/82.  She was then  placed on my schedule.  Patient denies any chest pain.  She denies any shortness of breath.  She denies any oliguria or hematuria.  She denies any vision changes.  The headache is mild and seems to be worse when her blood pressure is high.  Over the weekend, her blood pressure has fluctuated quite a bit.  The lowest she has seen her blood pressure is 140/86.  The highest she is seen his blood pressure is 190/80.  I personally repeated her blood pressure and found it to be 190/82 as well.  She is compliant with the losartan.  On her exam today there is no papilledema.  There is no evidence of endorgan damage.  EKG shows normal sinus rhythm with no ischemia no ST segment elevation, no T wave inversions or ST segment depression, no sign of left ventricular strain. Past Medical History:  Diagnosis Date  . Allergic rhinitis   . Anxiety   . Asymptomatic bilateral carotid artery stenosis    40-59% (02/2018)  . Breast cancer ([place]) 2015   Right Breast Cancer  . CAD (coronary artery disease)   . Cancer ([place])   . Depression   . Emphysema    no longer uses inhalers  . Gallstones    nausea, pain upper right abdomen  . GERD (gastroesophageal reflux disease)   . H/O hiatal hernia   . History of skin cancer   . Hyperlipidemia   . Hypertension   . Tobacco user   . Wears dentures    top   Past Surgical History:  Procedure Laterality Date  . BREAST LUMPECTOMY Right 2015  . CHOLECYSTECTOMY  07/08/2012   Procedure: LAPAROSCOPIC CHOLECYSTECTOMY WITH INTRAOPERATIVE CHOLANGIOGRAM;  Surgeon: Gayland Curry, MD,FACS;  Location: [facility];  Service: General;  Laterality: N/A;  Laparoscopic Cholecystectomy with Intraoperative Cholangiogram  . COLONOSCOPY    . ESOPHAGOSCOPY N/A 05/29/2017  Procedure: ESOPHAGOSCOPY;  Surgeon: Clarene Essex, MD;  Location: [facility];  Service: Endoscopy;  Laterality: N/A;  botox injection  . HAND SURGERY Left    wrist  . LEFT HEART CATH AND CORONARY ANGIOGRAPHY N/A 10/11/2017    Procedure: LEFT HEART CATH AND CORONARY ANGIOGRAPHY;  Surgeon: Martinique, Peter M, MD;  Location: [facility];  Service: Cardiovascular;  Laterality: N/A;  . TONSILECTOMY, ADENOIDECTOMY, BILATERAL MYRINGOTOMY AND TUBES    . TUBAL LIGATION     Current Outpatient Medications on File Prior to Visit  Medication Sig Dispense Refill  . anastrozole (ARIMIDEX) 1 MG tablet Take 1 tablet (1 mg total) by mouth daily. 90 tablet 3  . aspirin 81 MG tablet Take 81 mg by mouth daily.    Marland Kitchen atorvastatin (LIPITOR) 40 MG tablet TAKE 1 TABLET BY MOUTH EVERYDAY AT BEDTIME 90 tablet 1  . Cyanocobalamin (B-12) 2500 MCG TABS Take 2,500 mcg by mouth daily.    . diazepam (VALIUM) 5 MG tablet Take 5 mg by mouth at bedtime  3  . DULoxetine (CYMBALTA) 60 MG capsule TAKE 1 CAPSULE BY MOUTH EVERY DAY 90 capsule 2  . ibuprofen (ADVIL,MOTRIN) 200 MG tablet Take 400 mg by mouth daily as needed for headache or moderate pain.    Marland Kitchen losartan (COZAAR) 50 MG tablet Take 1 tablet (50 mg total) by mouth daily. 30 tablet 3  . Multiple Vitamin (MULTIVITAMIN) tablet Take 1 tablet by mouth daily.    Marland Kitchen omeprazole (PRILOSEC) 40 MG capsule Take 40 mg by mouth daily at 3 pm.     No current facility-administered medications on file prior to visit.    Allergies  Allergen Reactions  . Penicillins Anaphylaxis, Itching, Swelling and Rash    Has patient had a PCN reaction causing immediate rash, facial/tongue/throat swelling, SOB or lightheadedness with hypotension: Yes Has patient had a PCN reaction causing severe rash involving mucus membranes or skin necrosis: No Has patient had a PCN reaction that required hospitalization: Yes Has patient had a PCN reaction occurring within the last 10 years: No If all of the above answers are "NO", then may proceed with Cephalosporin use.   . Cefaclor Itching, Swelling and Rash  . Latex Itching and Rash  . Metronidazole Itching, Swelling and Rash  . Naproxen Itching, Swelling and Rash  . Nsaids  Itching, Swelling and Rash    Ibuprofen IS tolerated  . Septra [Bactrim] Itching, Swelling and Rash  . Sulfonamide Derivatives Itching, Swelling and Rash  . Tape Itching and Rash   Social History   Socioeconomic History  . Marital status: Married    Spouse name: Not on file  . Number of children: 1  . Years of education: Not on file  . Highest education level: Not on file  Occupational History  . Occupation: retired Clinical cytogeneticist: RETIRED  Social Needs  . Financial resource strain: Not on file  . Food insecurity:    Worry: Not on file    Inability: Not on file  . Transportation needs:    Medical: Not on file    Non-medical: Not on file  Tobacco Use  . Smoking status: Former Smoker    Packs/day: 0.50    Years: 40.00    Pack years: 20.00    Types: Cigarettes    Last attempt to quit: 02/13/2011    Years since quitting: 7.0  . Smokeless tobacco: Never Used  Substance and Sexual Activity  . Alcohol use: No  .  Drug use: No  . Sexual activity: Not on file  Lifestyle  . Physical activity:    Days per week: Not on file    Minutes per session: Not on file  . Stress: Not on file  Relationships  . Social connections:    Talks on phone: Not on file    Gets together: Not on file    Attends religious service: Not on file    Active member of club or organization: Not on file    Attends meetings of clubs or organizations: Not on file    Relationship status: Not on file  . Intimate partner violence:    Fear of current or ex partner: Not on file    Emotionally abused: Not on file    Physically abused: Not on file    Forced sexual activity: Not on file  Other Topics Concern  . Not on file  Social History Narrative  . Not on file     Review of Systems  All other systems reviewed and are negative.      Objective:   Physical Exam  Constitutional: She is oriented to person, place, and time. She appears well-developed and well-nourished. No distress.   HENT:  Head: Normocephalic and atraumatic.  Right Ear: External ear normal.  Left Ear: External ear normal.  Mouth/Throat: Oropharynx is clear and moist. No oropharyngeal exudate.  Eyes: Pupils are equal, round, and reactive to light. Conjunctivae and EOM are normal.  Neck: Normal range of motion. No JVD present. No thyromegaly present.  Cardiovascular: Normal rate, regular rhythm and normal heart sounds. Exam reveals no gallop and no friction rub.  No murmur heard. Pulmonary/Chest: Effort normal and breath sounds normal. No stridor. No respiratory distress. She has no wheezes. She has no rales.  Abdominal: Soft. Bowel sounds are normal. She exhibits no distension. There is no tenderness. There is no guarding.  Lymphadenopathy:    She has no cervical adenopathy.  Neurological: She is alert and oriented to person, place, and time. She has normal strength and normal reflexes. She displays tremor. She displays no atrophy. No cranial nerve deficit or sensory deficit. She exhibits normal muscle tone. She displays a negative Romberg sign. She displays no seizure activity.  Skin: She is not diaphoretic.  Vitals reviewed.         Assessment & Plan:  [place] - Plan: EKG 12-Lead, CBC with Differential/Platelet, BASIC METABOLIC PANEL WITH GFR  Hypertensive urgency  There is no evidence of endorgan damage.  Therefore I believe the patient is hypertensive urgency.  I have asked her to continue losartan 50 mg a day and supplement with hydrochlorothiazide 25 mg a day.  I will check a CBC as well as a CMP to evaluate for renal dysfunction.  Recheck the patient on Friday.  At that time hopefully we will see her blood pressure in the 924Q systolic.  If still elevated in the 190s, I would recommend switching to clonidine and evaluating for renal artery stenosis.  Patient is to go to the emergency room if she develops any symptoms of end organ damage including severe headache, vision changes, chest pain,  shortness of breath, oliguria, hematuria.  She agrees.

## 2018-02-21 ENCOUNTER — Ambulatory Visit (INDEPENDENT_AMBULATORY_CARE_PROVIDER_SITE_OTHER): Payer: Medicare Other | Admitting: Physician Assistant

## 2018-02-21 ENCOUNTER — Encounter: Payer: Self-pay | Admitting: Physician Assistant

## 2018-02-21 VITALS — Temp 97.6°F | Ht 61.0 in | Wt 110.0 lb

## 2018-02-21 DIAGNOSIS — I10 Essential (primary) hypertension: Secondary | ICD-10-CM | POA: Diagnosis not present

## 2018-02-21 DIAGNOSIS — I200 Unstable angina: Secondary | ICD-10-CM

## 2018-02-21 MED ORDER — HYDROCHLOROTHIAZIDE 12.5 MG PO CAPS: 12.5000 mg | ORAL_CAPSULE | Freq: Every day | ORAL | 0 refills | Status: DC

## 2018-02-21 NOTE — Progress Notes (Signed)
Patient ID: Brittany Kirk MRN: [id number], DOB: 1945/07/02, 72 y.o. Date of Encounter: 02/21/2018, 2:27 PM    Chief Complaint:  Chief Complaint  Patient presents with  . Hypertension  . Dizziness  . Fatigue     HPI: 72 y.o. year old female presents for f/u OV regarding her BP.   I reviewed her office note by Dr. Dennard Schaumann dated 02/17/2018. That note is very lengthy and incorporates information from her hospitalization in May 2019 as well as her visits with Dr. Dennard Schaumann on 10/28/2017, 11/05/2017, 12/09/2017, 01/06/2018, 02/17/2018.  Regarding her blood pressure--- the information that I saw her regarding blood pressure within all the information above--- is the fact that during her hospitalization on the night of 10/11/2017 her blood pressure dropped significantly and she became hypotensive.  See that note for details regarding that information. Ultimately, at time of discharge home from the hospital she refused to add any new medications. Wanted to resume meds she had been on prior to hospitalization.  Then at her visit with Dr. Dennard Schaumann on 02/17/2018-----His note references that ----she had seen cardiology on 02/14/2018.  BP was 190/80.  Losartan 50 mg was resumed. At her visit with Dr. Dennard Schaumann on 02/17/2018 BP was 190/82.  At that point she was being compliant with taking losartan.  At visit he added HCTZ 25 mg daily.  Today she reports ""when she takes the HCT, her Blood Pressure crashes." I asked if she had to discontinue the HCT secondary to low BP.  She states that she "took it for about 3 days."  She brings in a notebook where she has been documenting heart rates and blood pressures. Since her last visit here on 02/17/2018 she has been getting the following: 02/18/2018: 136/66-----5 AM--- sitting on sofa, resting 196/81-------10:30--- after shower 182/80--------- 1:00----after lunch 153/64 148/68  02/19/2018: 141/68---  8:30 a.m. after eating breakfast  158/79-----1:00 AM after taking a  shower 17/78 -------4:20 PM active from driving 469/62-----9:52 PM 106/53------ 7:08 PM 112/56 -------7:12 p.m. Then she got off the sofa to go to the kitchen and felt dizzy and weak. 119/56-------------- 8:30 PM 94/51--------------- 9:30 PM   02/20/2018: 123/62 at 8:00 AM when she was in bed before getting up 175/69 at 8:45 AM after breakfast and making up bed 181/74 at 9:30 AM with mild activity and sitting 158/74 at 10:00 AM with sitting and reading Notes that she then took BP med at 11:00 AM 115/62 at 12:00 PM sitting watching TV 106/55 at 3:20 PM sitting on the sofa felt tired and weak 93/62 at 5:00 PM 110/59 at 5:00 PM and active on sofa 110/62 at 9:00 PM sitting watching TV  02/21/2018: 159/71 after eating breakfast, resting 142/65 sitting and reading 142/74 active walking around the house    Home Meds:   Outpatient Medications Prior to Visit  Medication Sig Dispense Refill  . anastrozole (ARIMIDEX) 1 MG tablet Take 1 tablet (1 mg total) by mouth daily. 90 tablet 3  . aspirin 81 MG tablet Take 81 mg by mouth daily.    Marland Kitchen atorvastatin (LIPITOR) 40 MG tablet TAKE 1 TABLET BY MOUTH EVERYDAY AT BEDTIME 90 tablet 1  . Cyanocobalamin (B-12) 2500 MCG TABS Take 2,500 mcg by mouth daily.    . diazepam (VALIUM) 5 MG tablet Take 5 mg by mouth at bedtime  3  . DULoxetine (CYMBALTA) 60 MG capsule TAKE 1 CAPSULE BY MOUTH EVERY DAY 90 capsule 2  . ibuprofen (ADVIL,MOTRIN) 200 MG tablet Take 400 mg  by mouth daily as needed for headache or moderate pain.    Marland Kitchen losartan (COZAAR) 50 MG tablet Take 1 tablet (50 mg total) by mouth daily. 30 tablet 3  . Multiple Vitamin (MULTIVITAMIN) tablet Take 1 tablet by mouth daily.    Marland Kitchen omeprazole (PRILOSEC) 40 MG capsule Take 40 mg by mouth daily at 3 pm.    . hydrochlorothiazide (HYDRODIURIL) 25 MG tablet Take 1 tablet (25 mg total) by mouth daily. 90 tablet 3   No facility-administered medications prior to visit.     Allergies:  Allergies    Allergen Reactions  . Penicillins Anaphylaxis, Itching, Swelling and Rash    Has patient had a PCN reaction causing immediate rash, facial/tongue/throat swelling, SOB or lightheadedness with hypotension: Yes Has patient had a PCN reaction causing severe rash involving mucus membranes or skin necrosis: No Has patient had a PCN reaction that required hospitalization: Yes Has patient had a PCN reaction occurring within the last 10 years: No If all of the above answers are "NO", then may proceed with Cephalosporin use.   . Cefaclor Itching, Swelling and Rash  . Latex Itching and Rash  . Metronidazole Itching, Swelling and Rash  . Naproxen Itching, Swelling and Rash  . Nsaids Itching, Swelling and Rash    Ibuprofen IS tolerated  . Septra [Bactrim] Itching, Swelling and Rash  . Sulfonamide Derivatives Itching, Swelling and Rash  . Tape Itching and Rash      Review of Systems: See HPI for pertinent ROS. All other ROS negative.    Physical Exam: Temperature 97.6 F (36.4 C), temperature source Oral, height 5\' 1"  (1.549 m), weight 49.9 kg, SpO2 95 %., Body mass index is 20.78 kg/m. General:  Petite WF. Appears in no acute distress. Neck: Supple. No thyromegaly. No lymphadenopathy. Lungs: Clear bilaterally to auscultation without wheezes, rales, or rhonchi. Breathing is unlabored. Heart: Regular rhythm. No murmurs, rubs, or gallops. Msk:  Strength and tone normal for age. Extremities/Skin: Warm and dry.  Neuro: Alert and oriented X 3. Moves all extremities spontaneously. Gait is normal. CNII-XII grossly in tact. Psych:  Responds to questions appropriately with a normal affect.     ASSESSMENT AND PLAN:  72 y.o. year old female with   1. Essential hypertension Will continue the losartan at 50 mg daily.  Will decrease HCTZ from 25 down to 12.5 mg daily.   Will have her return to see Dr. Dennard Schaumann in 1 week.  Will wait to recheck bemet at that visit once she is back on additional  medications. - hydrochlorothiazide (MICROZIDE) 12.5 MG capsule; Take 1 capsule (12.5 mg total) by mouth daily.  Dispense: 30 capsule; Refill: 0   Signed, [address] [place], [place], [facility] 02/21/2018 2:27 PM

## 2018-02-24 ENCOUNTER — Ambulatory Visit: Payer: Medicare Other

## 2018-02-24 ENCOUNTER — Other Ambulatory Visit: Payer: Medicare Other

## 2018-02-24 ENCOUNTER — Ambulatory Visit: Payer: Medicare Other | Admitting: Oncology

## 2018-02-24 ENCOUNTER — Other Ambulatory Visit: Payer: Self-pay | Admitting: Physician Assistant

## 2018-02-24 DIAGNOSIS — I10 Essential (primary) hypertension: Secondary | ICD-10-CM

## 2018-02-25 MED ORDER — HYDROCHLOROTHIAZIDE 12.5 MG PO CAPS: ORAL_CAPSULE | ORAL | 0 refills | Status: DC

## 2018-02-25 NOTE — Addendum Note (Signed)
Addended by: Vonna Kotyk A on: 02/25/2018 09:01 AM   Modules accepted: Orders

## 2018-02-28 ENCOUNTER — Encounter: Payer: Self-pay | Admitting: Family Medicine

## 2018-02-28 ENCOUNTER — Other Ambulatory Visit: Payer: Medicare Other

## 2018-02-28 ENCOUNTER — Ambulatory Visit: Payer: Medicare Other | Admitting: Adult Health

## 2018-02-28 ENCOUNTER — Ambulatory Visit (INDEPENDENT_AMBULATORY_CARE_PROVIDER_SITE_OTHER): Payer: Medicare Other | Admitting: Family Medicine

## 2018-02-28 ENCOUNTER — Ambulatory Visit: Payer: Medicare Other

## 2018-02-28 VITALS — BP 166/80 | HR 80 | Temp 97.6°F | Resp 12 | Ht 61.0 in | Wt 111.0 lb

## 2018-02-28 DIAGNOSIS — E041 Nontoxic single thyroid nodule: Secondary | ICD-10-CM

## 2018-02-28 DIAGNOSIS — R0989 Other specified symptoms and signs involving the circulatory and respiratory systems: Secondary | ICD-10-CM

## 2018-02-28 DIAGNOSIS — F321 Major depressive disorder, single episode, moderate: Secondary | ICD-10-CM | POA: Diagnosis not present

## 2018-02-28 DIAGNOSIS — I10 Essential (primary) hypertension: Secondary | ICD-10-CM

## 2018-02-28 DIAGNOSIS — I200 Unstable angina: Secondary | ICD-10-CM | POA: Diagnosis not present

## 2018-02-28 LAB — BASIC METABOLIC PANEL
BUN: 15 mg/dL (ref 7–25)
CO2: 29 mmol/L (ref 20–32)
Calcium: 9.4 mg/dL (ref 8.6–10.4)
Chloride: 98 mmol/L (ref 98–110)
Creat: 0.84 mg/dL (ref 0.60–0.93)
Glucose, Bld: 105 mg/dL — ABNORMAL HIGH (ref 65–99)
Potassium: 4.3 mmol/L (ref 3.5–5.3)
Sodium: 136 mmol/L (ref 135–146)

## 2018-02-28 LAB — T3, FREE: T3, Free: 3.3 pg/mL (ref 2.3–4.2)

## 2018-02-28 LAB — T4, FREE: Free T4: 1.5 ng/dL (ref 0.8–1.8)

## 2018-02-28 LAB — TSH: TSH: 0.54 mIU/L (ref 0.40–4.50)

## 2018-02-28 NOTE — Progress Notes (Signed)
Subjective:    Patient ID: Brittany Kirk, female    DOB: [birth year], 72 y.o.   MRN: [id number]  Hypertension    Patient was recently admitted to the hospital.  I have copied relevant portions of the discharge summary and included them below for my reference:  Admit Date: 10/11/2017 Discharge Date: 10/12/2017  Primary Care Provider: Susy Frizzle, MD Primary Cardiologist: Dr. Gwenlyn Found, MD  Discharge Diagnoses    Active Problems:   Unstable angina [facility])  History of Present Illness     72 year old female with history of CAD medically managed as detailed below, emphysema, CKD stage III, HTN, HLD, breast cancer s/p lumpectomy, carotid artery disease, prior tobacco abuse for 25 pack years quitting ~ 3 years prior, strong family history of heart disease with multiple siblings who passed away at a premature age from MIs, and GERD who was admitted to [facility] with chest pain on 10/11/2017.   Patient has history of a remote cardiac cath by Dr. Lia Foyer, MD, in 09/2008, that showed moderate calcification in the coronary arteries, high-grade ostial ramus intermedius at trifurcation location involving LAD, ramus and the AV circumflex. Medical therapy was pursued as lesion was not ideal for percutaneous intervention given his ostial location andit's relationship to the LAD and the left circumflex interface. Most recent ischemic evaluation by [facility] on 01/05/2017 that was negative for ischemia, no EKG changes, EF 83%, low risk scan. Over the fall and winter months of 2018, she had an upper and lower endoscopy that were both unrevealing. She was seen in the office on 5/3 for evaluation of chest pain. At that time, she noted increasing SOB, substernal chest pain that radiated to her back and left upper extremity. Her EKG showed no acute changes. She was transported via EMS to [facility].   Hospital Course     Consultants: none  Upon her arrival to [facility], troponin was cycled and she ruled out.  CXR showed a mild opacity of the right base. She underwent LHC on 10/11/2017 that showed single vessel CAD involving the ostium of the ramus intermediate branch. Cardiac cath was unchanged from prior study in 2010. Given prior concern regarding shifting plaque into the LAD or LCx with PCI. Patient had not been on any antianginal therapy and was noted to be severely hypertensive in the cath lab. Aggressive medical therapy and BP control was advised. She was started on Coreg and Imdur. If she continues to have refractory angina despite optimal medical therapy and BP control, PCI of the ramus could be considered. Post cath labs showed stable renal function. In the evening of 5/3, the patient was hypotensive with systolic BP in the 47W to 80s. There were no complications from her cardiac cath site. PM Coreg was held. She was given IV saline bolus with improvement in BP. Initial BP this morning was soft in the 29F systolic. Coreg was held again. BP 125 mmHg currently. Patient has explained to Dr. Rayann Heman that she only wants to take her prior to admission medications upon discharge and she will not be sent home on any new medications. She has been advised to follow up with Dr. Gwenlyn Found for further medical management (message has been sent to our office).   The patient's right femoral cardiac cath site has been examined and is healing well without issues at this time. The patient has been seen by Dr. Rayann Heman, MD and felt to be stable for discharge today. All follow up appointments have  been made. Discharge medications are listed below. Prescriptions have been reviewed with the patient and sent in to their pharmacy.  _____________  10/28/17 Patient is here today complaining of pain going up and down her back, tremor which is constant, and feeling tired all the time.  I reviewed her past records and she is also had a CT scan of the chest abdomen and pelvis in March of this year which was reassuring.  Those records are copied  below: IMPRESSION: No findings specific for recurrent or metastatic disease.  Status post right axillary lymph node dissection.  Faint subpleural micronodularity measuring up to 3 mm, predominantly in the bilateral upper lobes. Consider follow-up CT chest in 6 months to confirm stability. However, this appearance is not worrisome for metastatic disease.  1.8 cm left adrenal nodule, only minimally increased from 2014, suggesting a benign adrenal adenoma.  Can now is clear  Last year, I had seen the patient for persistent nausea and weight loss.  Work-up at that time revealed no specific etiology.  My working diagnosis was most likely anxiety and stress.  Today the patient states that she has had burning stinging pain radiating from her neck to her tailbone and from her tailbone up to her neck now for several months.  She also reports worsening weakness in her arms and legs.  She reports worsening essential tremor.  This now involves her head and neck as well as both arms.  It worsens with activity and is present with rest.  She reports continued weight loss.  She reports feeling extremely tired and weak.  Patient saw neurology last year and had an MRI of the brain as well as cervical spine that was essentially normal.  Patient states that if I sent her to a neurologist she wants to second opinion.  At that time, my plan was: I spent approximately 30 minutes with the patient today conducting her history and her exam.  I have also spent a great deal of time reviewing her past medical records over the last several months including her referral to neurology, her follow-up with her oncologist, her recent admission to cardiology.  Given her unusual radicular almost neuropathic pain, I reviewed her records and found that she recently had a TSH in May of this year that was normal.  She had a vitamin B12 level checked in October 2018 that was normal.  She had an MRI in February 2018 of her neck that was  normal.  She has had a CT scan of the chest abdomen and pelvis that was normal or at least showed no specific cause of her symptoms.  As I explained to the patient, she has had numerous somatic complaints over the last 12 to 18 months with no specific finding on thorough diagnostic work-up.  I believe this could be related to anxiety, stress, and underlying depression.  Patient disagrees with this assessment.  She states that emotionally she feels fine.  Therefore I will proceed with an MRI of the cervical and lumbar spine to evaluate for any abnormalities that would explain her radicular pain and neuropathic pain that radiates from her neck to her tailbone and from her tailbone to her neck that may also cause her subjective weakness in her arms and legs.  Differential diagnosis includes multiple sclerosis.  Her essential tremor is certainly worsening however I will defer that at the present time until I have the results of her MRI.  If MRI shows no specific cause for symptoms, I  will revisit possible somatization disorder.    11/05/17 Patient called 5/21 and asked me to not order the MRI.  However the patient has an appointment already scheduled for Saturday.  She now states that she would like to keep that appointment.  She is here today continuing to complain of a burning heat warm sensation radiating from her neck to her tailbone and from her tailbone to her neck.  The pain, for lack of a better word, comes and goes with no specific cause and no specific alleviating factor.  She also continues to report nausea.  Nothing has helped her nausea.. Wt Readings from Last 3 Encounters:  02/28/18 111 lb (50.3 kg)  02/21/18 110 lb (49.9 kg)  02/17/18 113 lb 9.6 oz (51.5 kg)   Thankfully her weight has remained relatively stable over the last few weeks as evidenced above.  She denies any vomiting.  She has had an exhaustive GI work-up last year with no specific cause for her nausea.  As I explained to the patient  and her last visit, I believe some the symptoms could be conversion disorder essentially.  I wonder if stress and underlying depression may be causing a myriad of somatic complaints.  She does report depression.  She reports poor energy.  She reports fatigue.  She reports poor appetite.  She denies trouble sleeping.  She denies suicidal ideation.  She does report loss of interest/anhedonia.  She independently discontinued Lexapro due to perceived lack of benefit.   At that time, my plan was: I am not certain of the diagnosis but I am concerned that some of this may be conversion disorder and that some of her physical symptoms could be due to to underlying depression/stress/etc.  I am awaiting the results of the MRI but I have recommended starting the patient on Cymbalta 60 mg a day and rechecking in 4 weeks to see if her symptoms will improve.  I also believe there may be added benefit given some of the effect Cymbalta has been treating neuropathic pain.  Await the results of the MRI as previously scheduled.  12/09/17 Patient discontinued Lexapro and began Cymbalta 60 mg a day.  Patient states that the nausea has improved.  Her resting tremor has improved.  She still has very little appetite and has lost an additional 2 pounds since her last appointment.  She no longer has the neuropathic pain radiating from her head to her tailbone and from her tailbone to her head.  MRI of the cervical spine and lumbar spine revealed only mild spondylosis but no explanation for her symptoms.  Therefore I still believe we are dealing with conversion disorder secondary to uncontrolled depression and anxiety.  I explained this to the patient in length today.  She states that she feels that her depression has not improved since switching to Cymbalta.  She actually feels more depressed in the afternoons than previously.  She denies any panic attacks.  She denies any suicidal ideation.  At that time, my plan was: I am heartened by  the fact the nausea has improved, the pain has improved, and the tremor has improved.  I feel we are on the right track.  Of asked the patient to allow the Cymbalta 2-4 more weeks to take full effect.  At that point if her depression is not any better, I would consider switching the patient to Prozac as an appetite stimulant coupled with Wellbutrin in place of the Cymbalta.  However I would still focus  on treating underlying anxiety and depression as a cause of her physical symptoms as stated earlier.  01/06/18 I am very happy to report that the patient feels much better.  Her depression is improving.  Furthermore her anxiety is improving.  She is taking less and less diazepam.  She has been attending a class at her church regarding depression and anxiety and she feels that this is been extremely beneficial to her being able to talk to other people who have experience what she is experiencing and know how she feels.  The anxiety is improving.  She is sleeping better.  She continues to deny any suicidal ideation.  She states that she feels much happier.  She is still battling depression but it has improved substantially.  She reports less anhedonia.  She reports more energy and better concentration.  At that time, my plan was: I am so happy that the patient is benefiting from treatment.  Continue Cymbalta 60 mg a day.  We did discuss starting a low-dose Klonopin 0.5 mg p.o. twice daily on a scheduled basis to try to help calm anxiety.  Patient is actually feeling better and wants to avoid additional medication and continue on her current treatment regimen for the present time.  I believe this is completely appropriate.  Recheck the patient in 3 to 6 months or as needed.  02/17/18 Saw Cardiology on 02/14/18.  BP was 190/80.  Reviewed their note.  Losartan 50 mg poqday was resumed.  She walked in this morning complaining of dizziness and headache asking for her BP to be checked.  Initially 190/82.  She was then  placed on my schedule.  Patient denies any chest pain.  She denies any shortness of breath.  She denies any oliguria or hematuria.  She denies any vision changes.  The headache is mild and seems to be worse when her blood pressure is high.  Over the weekend, her blood pressure has fluctuated quite a bit.  The lowest she has seen her blood pressure is 140/86.  The highest she is seen his blood pressure is 190/80.  I personally repeated her blood pressure and found it to be 190/82 as well.  She is compliant with the losartan.  On her exam today there is no papilledema.  There is no evidence of endorgan damage.  EKG shows normal sinus rhythm with no ischemia no ST segment elevation, no T wave inversions or ST segment depression, no sign of left ventricular strain.  At that time, my plan was: There is no evidence of endorgan damage.  Therefore I believe the patient is hypertensive urgency.  I have asked her to continue losartan 50 mg a day and supplement with hydrochlorothiazide 25 mg a day.  I will check a CBC as well as a CMP to evaluate for renal dysfunction.  Recheck the patient on Friday.  At that time hopefully we will see her blood pressure in the 703J systolic.  If still elevated in the 190s, I would recommend switching to clonidine and evaluating for renal artery stenosis.  Patient is to go to the emergency room if she develops any symptoms of end organ damage including severe headache, vision changes, chest pain, shortness of breath, oliguria, hematuria.  She agrees.  02/28/18 Patient saw my partner last Friday, September 13.  After the addition of hydrochlorothiazide, her blood pressure began to drop precipitously.  Therefore my partner reduce her hydrochlorothiazide to 12.5 mg and continued her on losartan 50 mg a day.  She presents today with 1 week of additional blood pressures.  Her morning blood pressures are consistently high between 536 and 144 systolic.  Diastolic blood pressures always well  controlled.  She is taking hydrochlorothiazide and losartan at morning around 9:00.  Her afternoon blood pressures are well controlled and at times even low averaging between 315 and 400 systolic.  She is checking her blood pressures 5 and 6 times a day.  I reviewed the results of her carotid Doppler ordered by her cardiologist.  There was bilateral internal carotid artery stenosis of 40 to 59%.  There also coincidental findings of irregular complex nodules and a thyroid ultrasound was recommended.  Patient now wants to discontinue Cymbalta.  She states that she thinks the medication is not helping anymore the Lexapro did and she does not want to stay on the medication.   Past Medical History:  Diagnosis Date  . Allergic rhinitis   . Anxiety   . Asymptomatic bilateral carotid artery stenosis    40-59% (02/2018)  . Breast cancer ([place]) 2015   Right Breast Cancer  . CAD (coronary artery disease)   . Cancer ([place])   . Depression   . Emphysema    no longer uses inhalers  . Gallstones    nausea, pain upper right abdomen  . GERD (gastroesophageal reflux disease)   . H/O hiatal hernia   . History of skin cancer   . Hyperlipidemia   . Hypertension   . Tobacco user   . Wears dentures    top   Past Surgical History:  Procedure Laterality Date  . BREAST LUMPECTOMY Right 2015  . CHOLECYSTECTOMY  07/08/2012   Procedure: LAPAROSCOPIC CHOLECYSTECTOMY WITH INTRAOPERATIVE CHOLANGIOGRAM;  Surgeon: Gayland Curry, MD,FACS;  Location: [facility];  Service: General;  Laterality: N/A;  Laparoscopic Cholecystectomy with Intraoperative Cholangiogram  . COLONOSCOPY    . ESOPHAGOSCOPY N/A 05/29/2017   Procedure: ESOPHAGOSCOPY;  Surgeon: Clarene Essex, MD;  Location: [facility];  Service: Endoscopy;  Laterality: N/A;  botox injection  . HAND SURGERY Left    wrist  . LEFT HEART CATH AND CORONARY ANGIOGRAPHY N/A 10/11/2017   Procedure: LEFT HEART CATH AND CORONARY ANGIOGRAPHY;  Surgeon: Martinique, Peter M, MD;  Location:  [facility];  Service: Cardiovascular;  Laterality: N/A;  . TONSILECTOMY, ADENOIDECTOMY, BILATERAL MYRINGOTOMY AND TUBES    . TUBAL LIGATION     Current Outpatient Medications on File Prior to Visit  Medication Sig Dispense Refill  . anastrozole (ARIMIDEX) 1 MG tablet Take 1 tablet (1 mg total) by mouth daily. 90 tablet 3  . aspirin 81 MG tablet Take 81 mg by mouth daily.    Marland Kitchen atorvastatin (LIPITOR) 40 MG tablet TAKE 1 TABLET BY MOUTH EVERYDAY AT BEDTIME 90 tablet 1  . Cyanocobalamin (B-12) 2500 MCG TABS Take 2,500 mcg by mouth daily.    . diazepam (VALIUM) 5 MG tablet Take 5 mg by mouth at bedtime  3  . DULoxetine (CYMBALTA) 60 MG capsule TAKE 1 CAPSULE BY MOUTH EVERY DAY 90 capsule 2  . hydrochlorothiazide (MICROZIDE) 12.5 MG capsule TAKE 1 CAPSULE BY MOUTH EVERY DAY 30 capsule 0  . ibuprofen (ADVIL,MOTRIN) 200 MG tablet Take 400 mg by mouth daily as needed for headache or moderate pain.    Marland Kitchen losartan (COZAAR) 50 MG tablet Take 1 tablet (50 mg total) by mouth daily. 30 tablet 3  . Multiple Vitamin (MULTIVITAMIN) tablet Take 1 tablet by mouth daily.    Marland Kitchen omeprazole (PRILOSEC) 40  MG capsule Take 40 mg by mouth daily at 3 pm.     No current facility-administered medications on file prior to visit.    Allergies  Allergen Reactions  . Penicillins Anaphylaxis, Itching, Swelling and Rash    Has patient had a PCN reaction causing immediate rash, facial/tongue/throat swelling, SOB or lightheadedness with hypotension: Yes Has patient had a PCN reaction causing severe rash involving mucus membranes or skin necrosis: No Has patient had a PCN reaction that required hospitalization: Yes Has patient had a PCN reaction occurring within the last 10 years: No If all of the above answers are "NO", then may proceed with Cephalosporin use.   . Cefaclor Itching, Swelling and Rash  . Latex Itching and Rash  . Metronidazole Itching, Swelling and Rash  . Naproxen Itching, Swelling and Rash  . Nsaids  Itching, Swelling and Rash    Ibuprofen IS tolerated  . Septra [Bactrim] Itching, Swelling and Rash  . Sulfonamide Derivatives Itching, Swelling and Rash  . Tape Itching and Rash   Social History   Socioeconomic History  . Marital status: Married    Spouse name: Not on file  . Number of children: 1  . Years of education: Not on file  . Highest education level: Not on file  Occupational History  . Occupation: retired Clinical cytogeneticist: RETIRED  Social Needs  . Financial resource strain: Not on file  . Food insecurity:    Worry: Not on file    Inability: Not on file  . Transportation needs:    Medical: Not on file    Non-medical: Not on file  Tobacco Use  . Smoking status: Former Smoker    Packs/day: 0.50    Years: 40.00    Pack years: 20.00    Types: Cigarettes    Last attempt to quit: 02/13/2011    Years since quitting: 7.0  . Smokeless tobacco: Never Used  Substance and Sexual Activity  . Alcohol use: No  . Drug use: No  . Sexual activity: Not on file  Lifestyle  . Physical activity:    Days per week: Not on file    Minutes per session: Not on file  . Stress: Not on file  Relationships  . Social connections:    Talks on phone: Not on file    Gets together: Not on file    Attends religious service: Not on file    Active member of club or organization: Not on file    Attends meetings of clubs or organizations: Not on file    Relationship status: Not on file  . Intimate partner violence:    Fear of current or ex partner: Not on file    Emotionally abused: Not on file    Physically abused: Not on file    Forced sexual activity: Not on file  Other Topics Concern  . Not on file  Social History Narrative  . Not on file     Review of Systems  All other systems reviewed and are negative.      Objective:   Physical Exam  Constitutional: She is oriented to person, place, and time. She appears well-developed and well-nourished. No distress.    HENT:  Head: Normocephalic and atraumatic.  Right Ear: External ear normal.  Left Ear: External ear normal.  Mouth/Throat: Oropharynx is clear and moist. No oropharyngeal exudate.  Eyes: Pupils are equal, round, and reactive to light. Conjunctivae and EOM are normal.  Neck: Normal range  of motion. No JVD present. No thyromegaly present.  Cardiovascular: Normal rate, regular rhythm and normal heart sounds. Exam reveals no gallop and no friction rub.  No murmur heard. Pulmonary/Chest: Effort normal and breath sounds normal. No stridor. No respiratory distress. She has no wheezes. She has no rales.  Abdominal: Soft. Bowel sounds are normal. She exhibits no distension. There is no tenderness. There is no guarding.  Lymphadenopathy:    She has no cervical adenopathy.  Neurological: She is alert and oriented to person, place, and time. She has normal strength and normal reflexes. She displays tremor. She displays no atrophy. No cranial nerve deficit or sensory deficit. She exhibits normal muscle tone. She displays a negative Romberg sign. She displays no seizure activity.  Skin: She is not diaphoretic.  Vitals reviewed.         Assessment & Plan:  Thyroid nodule - Plan: T4, free, T3, Free, TSH, Basic Metabolic Panel, US THYROID  Labile blood pressure  Essential hypertension  Depression, major, single episode, moderate (HCC)  First I need to work-up the thyroid nodules.  I will obtain a TSH, free T3, and free T4 to determine if they are hot or cold nodules.  I would also obtain a thyroid ultrasound to determine if these nodules require fine-needle biopsy.  Second I am concerned about her labile blood pressure.  If thyroid test is normal, I would consider working up for possible pheochromocytoma and renal artery stenosis.  I will begin by repeating a BMP after the addition of losartan to look for any evidence of renal insufficiency which would be consistent with renal artery stenosis.  I  have recommended that she take losartan 50 mg p.o. twice daily to spread the medication out over a 24-hour period more consistently and continue hydrochlorothiazide 12.5 mg in the morning.  I hope that this will help bring down her blood pressures in the morning but avoid hypotension in the evening.  Recheck blood pressure in 1 week.  If blood pressures remain labile, I would proceed with a 24-hour urine collection to evaluate for pheochromocytoma particular given the previous history of a "benign adrenal adenoma" seen on previous CT.  I have also recommended that we not change Cymbalta at the present time to avoid possible confounding symptoms due to withdrawal of medication or worsening anxiety.  Once we have the blood pressure adequately controlled and the thyroid nodules worked up, we can certainly revisit discontinuation of Cymbalta.  I do not believe the Cymbalta is causing the hypertension.  There is only occasional report of 2% of patients experiencing elevated blood pressure on Cymbalta and I feel that this is unlikely for this patient

## 2018-03-07 ENCOUNTER — Ambulatory Visit
Admission: RE | Admit: 2018-03-07 | Discharge: 2018-03-07 | Disposition: A | Discharge status: Home / Self Care | Payer: Medicare Other | Source: Ambulatory Visit | Attending: Family Medicine | Admitting: Family Medicine

## 2018-03-07 DIAGNOSIS — E041 Nontoxic single thyroid nodule: Secondary | ICD-10-CM

## 2018-03-10 ENCOUNTER — Encounter: Payer: Self-pay | Admitting: Family Medicine

## 2018-03-10 DIAGNOSIS — E042 Nontoxic multinodular goiter: Secondary | ICD-10-CM | POA: Insufficient documentation

## 2018-03-11 DIAGNOSIS — Z85828 Personal history of other malignant neoplasm of skin: Secondary | ICD-10-CM | POA: Diagnosis not present

## 2018-03-11 DIAGNOSIS — L570 Actinic keratosis: Secondary | ICD-10-CM | POA: Diagnosis not present

## 2018-03-11 DIAGNOSIS — L820 Inflamed seborrheic keratosis: Secondary | ICD-10-CM | POA: Diagnosis not present

## 2018-03-11 DIAGNOSIS — L821 Other seborrheic keratosis: Secondary | ICD-10-CM | POA: Diagnosis not present

## 2018-04-01 ENCOUNTER — Ambulatory Visit (INDEPENDENT_AMBULATORY_CARE_PROVIDER_SITE_OTHER): Payer: Medicare Other | Admitting: Cardiovascular Disease

## 2018-04-01 ENCOUNTER — Encounter: Payer: Self-pay | Admitting: Cardiovascular Disease

## 2018-04-01 DIAGNOSIS — I10 Essential (primary) hypertension: Secondary | ICD-10-CM

## 2018-04-01 DIAGNOSIS — I200 Unstable angina: Secondary | ICD-10-CM | POA: Diagnosis not present

## 2018-04-01 MED ORDER — LOSARTAN POTASSIUM 100 MG PO TABS: 100.0000 mg | ORAL_TABLET | Freq: Every day | ORAL | 3 refills | Status: DC

## 2018-04-01 NOTE — Assessment & Plan Note (Signed)
History of essential hypertension her blood pressure measured today at 162/74.  She is on losartan 50 mg a day.  She has morning systolic hypertension and occasional evening hypotension.  She is aware of salt restriction.  I have asked her to increase her losartan to 100 mg a day.  She will keep a blood pressure log over the next 30 days I will see Brittany Kirk back for review and medication titration.

## 2018-04-01 NOTE — Assessment & Plan Note (Signed)
History of minimal CAD by cath performed by Dr.Jordan May of this year with a 85% ostial small ramus branch stenosis.  The patient currently denies chest pain.

## 2018-04-01 NOTE — Assessment & Plan Note (Signed)
History of dyslipidemia on Lipitor with recent lipid profile performed 10/12/2017 revealing total cholesterol 91, LDL 41 and HDL 41.

## 2018-04-01 NOTE — Patient Instructions (Signed)
Medication Instructions:  Your physician has recommended you make the following change in your medication:  1) INCREASE Losartan to 100 mg tablet by mouth ONCE daily in the morning  If you need a refill on your cardiac medications before your next appointment, please call your pharmacy.   Lab work: none If you have labs (blood work) drawn today and your tests are completely normal, you will receive your results only by: Marland Kitchen MyChart Message (if you have MyChart) OR . A paper copy in the mail If you have any lab test that is abnormal or we need to change your treatment, we will call you to review the results.  Testing/Procedures: none  Follow-Up: At [facility], you and your health needs are our priority.  As part of our continuing mission to provide you with exceptional heart care, we have created designated Provider Care Teams.  These Care Teams include your primary Cardiologist (physician) and Advanced Practice Providers (APPs -  Physician Assistants and Nurse Practitioners) who all work together to provide you with the care you need, when you need it. We request that you follow-up in: 6 months with Kerin Ransom, PA and in 12 months with Dr Gwenlyn Found.  You will receive a reminder letter in the mail two months in advance. If you don't receive a letter, please call our office to schedule the follow-up appointment.    Please call our office 2 months in advance to schedule this appointment.  You may see Dr. Gwenlyn Found or one of the following Advanced Practice Providers on your designated Care Team:   Kerin Ransom, PA-C Roby Lofts, [place] . Sande Rives, PA-C  Any Other Special Instructions Will Be Listed Below (If Applicable).  Your physician recommends that you schedule a follow-up appointment in: Hypertension Clinic in 4 weeks

## 2018-04-01 NOTE — Progress Notes (Signed)
04/01/2018 Colin Benton Aicher   11-10-1945  [id number]  Primary Physician Pickard, Cammie Mcgee, MD Primary Cardiologist: Lorretta Harp MD [name], [place]  HPI:  Brittany Kirk is a 72 y.o.  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 She was referred by Dr. Dennard Schaumann for cardiovascular evaluation because of chest pain. I last saw her in the office  10/22/2017.Risk factors include 25 pack years of tobacco abuse having quit 3 years ago. She has treated hypertension as well as a strong family history of heart disease with multiple siblings who died at a premature age of myocardial infarction's. She has never had a heart attack or stroke. She has had a remote cardiac catheterization by Dr. Lia Foyer but no intervention was performed. She was complaining of some substernal chest pain with chronic nausea. This has since some sided after beginning on a proton pump inhibitor.She had a negative stress test 01/24/16 and again during a recent consultation for chest pain 01/05/17.She has had upper and lower endoscopy for abdominal pain and nausea which was unrevealing. In the office she complains of increasing shortness of breath, substernal chest pain rating to her back and left upper extremity. Her EKG shows no acute changes. She clearly appeared uncomfortable .  I had her urgently transported to [facility] by EMS for cardiac catheterization which was performed by Dr. Martinique showing moderately severe ramus branch ostial disease with noncritical disease otherwise a normal LV function.  This is unchanged from prior cath performed in 2010 it was thought that her chest pain was noncardiac in nature. Since I saw her in the office 5 months ago she has seen Kerin Ransom back a month and a half ago to review her carotid Doppler studies at which time her blood pressure was in the 1 90-200 range.  He is  suggested that she restart her low-dose losartan which she did.  Her blood pressures been running in the 169 -678 mmHg systolic in the morning hours and sometimes significantly hypotensive at night.   Current Meds  Medication Sig  . anastrozole (ARIMIDEX) 1 MG tablet Take 1 tablet (1 mg total) by mouth daily.  Marland Kitchen aspirin 81 MG tablet Take 81 mg by mouth daily.  Marland Kitchen atorvastatin (LIPITOR) 40 MG tablet TAKE 1 TABLET BY MOUTH EVERYDAY AT BEDTIME  . Cyanocobalamin (B-12) 2500 MCG TABS Take 2,500 mcg by mouth daily.  . diazepam (VALIUM) 5 MG tablet Take 5 mg by mouth as needed.   . DULoxetine (CYMBALTA) 60 MG capsule TAKE 1 CAPSULE BY MOUTH EVERY DAY  . ibuprofen (ADVIL,MOTRIN) 200 MG tablet Take 400 mg by mouth daily as needed for headache or moderate pain.  Marland Kitchen losartan (COZAAR) 100 MG tablet Take 1 tablet (100 mg total) by mouth daily.  . Multiple Vitamin (MULTIVITAMIN) tablet Take 1 tablet by mouth daily.  Marland Kitchen omeprazole (PRILOSEC) 40 MG capsule Take 40 mg by mouth daily at 3 pm.  . [DISCONTINUED] hydrochlorothiazide (MICROZIDE) 12.5 MG capsule TAKE 1 CAPSULE BY MOUTH EVERY DAY  . [DISCONTINUED] losartan (COZAAR) 50 MG tablet Take 1 tablet (50 mg total) by mouth daily.     Allergies  Allergen Reactions  . Penicillins Anaphylaxis, Itching, Swelling and Rash    Has patient had a PCN reaction causing immediate rash, facial/tongue/throat swelling, SOB or lightheadedness with hypotension: Yes Has patient had a PCN reaction causing severe rash involving mucus  membranes or skin necrosis: No Has patient had a PCN reaction that required hospitalization: Yes Has patient had a PCN reaction occurring within the last 10 years: No If all of the above answers are "NO", then may proceed with Cephalosporin use.   . Cefaclor Itching, Swelling and Rash  . Latex Itching and Rash  . Metronidazole Itching, Swelling and Rash  . Naproxen Itching, Swelling and Rash  . Nsaids Itching, Swelling and Rash    Ibuprofen  IS tolerated  . Septra [Bactrim] Itching, Swelling and Rash  . Sulfonamide Derivatives Itching, Swelling and Rash  . Tape Itching and Rash    Social History   Socioeconomic History  . Marital status: Married    Spouse name: Not on file  . Number of children: 1  . Years of education: Not on file  . Highest education level: Not on file  Occupational History  . Occupation: retired Clinical cytogeneticist: RETIRED  Social Needs  . Financial resource strain: Not on file  . Food insecurity:    Worry: Not on file    Inability: Not on file  . Transportation needs:    Medical: Not on file    Non-medical: Not on file  Tobacco Use  . Smoking status: Former Smoker    Packs/day: 0.50    Years: 40.00    Pack years: 20.00    Types: Cigarettes    Last attempt to quit: 02/13/2011    Years since quitting: 7.1  . Smokeless tobacco: Never Used  Substance and Sexual Activity  . Alcohol use: No  . Drug use: No  . Sexual activity: Not on file  Lifestyle  . Physical activity:    Days per week: Not on file    Minutes per session: Not on file  . Stress: Not on file  Relationships  . Social connections:    Talks on phone: Not on file    Gets together: Not on file    Attends religious service: Not on file    Active member of club or organization: Not on file    Attends meetings of clubs or organizations: Not on file    Relationship status: Not on file  . Intimate partner violence:    Fear of current or ex partner: Not on file    Emotionally abused: Not on file    Physically abused: Not on file    Forced sexual activity: Not on file  Other Topics Concern  . Not on file  Social History Narrative  . Not on file     Review of Systems: General: negative for chills, fever, night sweats or weight changes.  Cardiovascular: negative for chest pain, dyspnea on exertion, edema, orthopnea, palpitations, paroxysmal nocturnal dyspnea or shortness of breath Dermatological: negative for  rash Respiratory: negative for cough or wheezing Urologic: negative for hematuria Abdominal: negative for nausea, vomiting, diarrhea, bright red blood per rectum, melena, or hematemesis Neurologic: negative for visual changes, syncope, or dizziness All other systems reviewed and are otherwise negative except as noted above.    Blood pressure (!) 162/74, pulse 72, height 5\' 1"  (1.549 m), weight 112 lb (50.8 kg).  General appearance: alert and no distress Neck: no adenopathy, no JVD, supple, symmetrical, trachea midline, thyroid not enlarged, symmetric, no tenderness/mass/nodules and Bilateral carotid bruits Lungs: clear to auscultation bilaterally Heart: regular rate and rhythm, S1, S2 normal, no murmur, click, rub or gallop Extremities: extremities normal, atraumatic, no cyanosis or edema Pulses: 2+ and symmetric  Skin: Skin color, texture, turgor normal. No rashes or lesions Neurologic: Alert and oriented X 3, normal strength and tone. Normal symmetric reflexes. Normal coordination and gait  EKG not performed today  ASSESSMENT AND PLAN:   CAD in native artery History of minimal CAD by cath performed by Dr.Jordan May of this year with a 85% ostial small ramus branch stenosis.  The patient currently denies chest pain.  Dyslipidemia, goal LDL below 70 History of dyslipidemia on Lipitor with recent lipid profile performed 10/12/2017 revealing total cholesterol 91, LDL 41 and HDL 41.  Essential hypertension History of essential hypertension her blood pressure measured today at 162/74.  She is on losartan 50 mg a day.  She has morning systolic hypertension and occasional evening hypotension.  She is aware of salt restriction.  I have asked her to increase her losartan to 100 mg a day.  She will keep a blood pressure log over the next 30 days I will see Cyril Mourning back for review and medication titration.      Lorretta Harp MD FACP,FACC,FAHA, [facility] 04/01/2018 1:42 PM

## 2018-04-04 ENCOUNTER — Ambulatory Visit: Payer: Medicare Other | Admitting: Cardiovascular Disease

## 2018-04-23 DIAGNOSIS — Z23 Encounter for immunization: Secondary | ICD-10-CM | POA: Diagnosis not present

## 2018-04-29 ENCOUNTER — Ambulatory Visit (INDEPENDENT_AMBULATORY_CARE_PROVIDER_SITE_OTHER): Payer: Medicare Other | Admitting: Pharmacist

## 2018-04-29 VITALS — BP 130/78 | HR 64

## 2018-04-29 DIAGNOSIS — I10 Essential (primary) hypertension: Secondary | ICD-10-CM

## 2018-04-29 DIAGNOSIS — I200 Unstable angina: Secondary | ICD-10-CM

## 2018-04-29 MED ORDER — LOSARTAN POTASSIUM 50 MG PO TABS: ORAL_TABLET | ORAL | 11 refills | Status: DC

## 2018-04-29 NOTE — Progress Notes (Signed)
Patient ID: Brittany Kirk                 DOB: 11-23-1945                      MRN: [id number]     HPI: Brittany Kirk is a 72 y.o. female patient of Dr. Gwenlyn Found who presents today for hypertension evaluation. PMH significant for CAD in native artery, HLD, HTN, and carotid disease. At last visit with Dr. Gwenlyn Found, blood pressure was elevated at 162/74 mmHg  in office and losartan was increased to 100mg  daily. She was asked to check her blood pressure at home and keep a log.   Patient arrives today alone and in good spirits. Patient reports that her blood pressure drops most evenings with accompanied headaches and dizziness. She reports that she has no energy at those times and she feels like she wants to go to bed and sleep. These low BPs are generally after dinner and right before bed time. Occasionally she wakes up with headaches, BP readings are 409-811 systolic during that time frame. Patient reports that she does not take hydrochlorothiazide anymore because of hypotension.   Patient reports that she eats breakfast and drinks 1 cup coffee, takes her medications, then checks her blood pressure ~1 hour after taking her medication. Patient uses a wrist cuff (bought in October 2019) and an arm cuff (87-29 years old). She uses the wrist cuff most often because it is easier to use.   Current HTN meds: losartan 100mg  daily   Previously tried: carvedilol 12.5mg  bid (bradycardia), hydrochlorothiazide 12.5-25mg  daily (hypotension), furosemide 20mg  daily (unknown), metoprolol succinate 12.5mg  daily (bradycardia), propranolol 80mg  daily (bradycardia), ramipril 5mg  daily (unknown), triamterene-hctz 37.5-25mg  daily (unknown)  BP goal: <130/80 mmHg  Family History: multiple siblings have died from MI at a premature age (brothers - 80, 89s, 57; sister - 41, 32), paternal grandfather (CVD), maternal grandfather (MI, 34)  Social History: 20 pack year smoking history, quit in 2012. Denies alcohol use, denies illicit  drug use.  Diet: Eats mostly from home. Cooks chicken, pastas, vegetables/salads. Eats some salted nuts when blood pressure gets low, does not salt food when she is cooking. Drinks 1 cup coffee in the mornings, occasionally 1 small Coke; drinks mostly water.   Exercise: Walks around the house for 20-25 minutes, is active around the house (does lots of laundry).   Home BP readings: mornings - 120s-140s/60s-80s (highest 155/74), evenings - 90s-100s/40s-50s (lowest 100/33, 92/43)  Wt Readings from Last 3 Encounters:  04/01/18 112 lb (50.8 kg)  02/28/18 111 lb (50.3 kg)  02/21/18 110 lb (49.9 kg)   BP Readings from Last 3 Encounters:  04/29/18 130/78  04/01/18 (!) 162/74  02/28/18 (!) 166/80   Pulse Readings from Last 3 Encounters:  04/29/18 64  04/01/18 72  02/28/18 80    Renal function: CrCl cannot be calculated (Patient's most recent lab result is older than the maximum 21 days allowed.).  Past Medical History:  Diagnosis Date  . Allergic rhinitis   . Anxiety   . Asymptomatic bilateral carotid artery stenosis    40-59% (02/2018)  . Breast cancer ([place]) 2015   Right Breast Cancer  . CAD (coronary artery disease)   . Cancer ([place])   . Depression   . Emphysema    no longer uses inhalers  . Gallstones    nausea, pain upper right abdomen  . GERD (gastroesophageal reflux disease)   . H/O hiatal hernia   .  History of skin cancer   . Hyperlipidemia   . Hypertension   . Multinodular goiter (nontoxic)   . Tobacco user   . Wears dentures    top    Current Outpatient Medications on File Prior to Visit  Medication Sig Dispense Refill  . anastrozole (ARIMIDEX) 1 MG tablet Take 1 tablet (1 mg total) by mouth daily. 90 tablet 3  . aspirin 81 MG tablet Take 81 mg by mouth daily.    Marland Kitchen atorvastatin (LIPITOR) 40 MG tablet TAKE 1 TABLET BY MOUTH EVERYDAY AT BEDTIME 90 tablet 1  . Cyanocobalamin (B-12) 2500 MCG TABS Take 2,500 mcg by mouth daily.    . diazepam (VALIUM) 5 MG tablet  Take 5 mg by mouth as needed.   3  . DULoxetine (CYMBALTA) 60 MG capsule TAKE 1 CAPSULE BY MOUTH EVERY DAY 90 capsule 2  . ibuprofen (ADVIL,MOTRIN) 200 MG tablet Take 400 mg by mouth daily as needed for headache or moderate pain.    . Multiple Vitamin (MULTIVITAMIN) tablet Take 1 tablet by mouth daily.    Marland Kitchen omeprazole (PRILOSEC) 40 MG capsule Take 40 mg by mouth daily at 3 pm.     No current facility-administered medications on file prior to visit.     Allergies  Allergen Reactions  . Penicillins Anaphylaxis, Itching, Swelling and Rash    Has patient had a PCN reaction causing immediate rash, facial/tongue/throat swelling, SOB or lightheadedness with hypotension: Yes Has patient had a PCN reaction causing severe rash involving mucus membranes or skin necrosis: No Has patient had a PCN reaction that required hospitalization: Yes Has patient had a PCN reaction occurring within the last 10 years: No If all of the above answers are "NO", then may proceed with Cephalosporin use.   . Cefaclor Itching, Swelling and Rash  . Latex Itching and Rash  . Metronidazole Itching, Swelling and Rash  . Naproxen Itching, Swelling and Rash  . Nsaids Itching, Swelling and Rash    Ibuprofen IS tolerated  . Septra [Bactrim] Itching, Swelling and Rash  . Sulfonamide Derivatives Itching, Swelling and Rash  . Tape Itching and Rash    Blood pressure 130/78, pulse 64.   Assessment/Plan:  1. Hypertension: Blood pressure in office near goal of <130/80 mmHg. Per home log, blood pressure uncontrolled in the mornings and hypotensive in the evenings. As patient takes blood pressure medication in the mornings and she does not become hypotensive until the evenings, total daily dose of losartan may be too high. Since patient's blood pressure is elevated in the mornings and low in the evenings, will decrease total daily dose of losartan to 75mg , dosed as 25mg  in the mornings and 50mg  in the evenings. Checked BMET (SCr,  K) today since losartan dose increase at last visit with Dr. Gwenlyn Found. Follow up in 4 weeks with hypertension clinic. Asked patient to continue logging home blood pressures and to bring the log and her BP cuff with her to follow up visit to assess for accuracy.     Cleotis Lema, PharmD Student 04/29/2018 3:00 PM

## 2018-04-29 NOTE — Patient Instructions (Addendum)
Thank you for coming to see [place] today!  STOP taking losartan 100 mg daily.  START taking losartan 25 mg (1/2 tablet of 50 mg) in the mornings and 50 mg (1 tablet of 50 mg OR 1/2 tablet of 100 mg) in the evenings.   Keep checking your blood pressure and keep a log of the readings. Please bring your blood pressure cuff to your follow up visit so that we can compare your home cuff to the in office readings.   We will call you if there is anything of note with your lab work from today.

## 2018-04-30 LAB — BASIC METABOLIC PANEL WITH GFR
BUN/Creatinine Ratio: 21 (ref 12–28)
BUN: 15 mg/dL (ref 8–27)
CO2: 24 mmol/L (ref 20–29)
Calcium: 8.5 mg/dL — ABNORMAL LOW (ref 8.7–10.3)
Chloride: 101 mmol/L (ref 96–106)
Creatinine, Ser: 0.7 mg/dL (ref 0.57–1.00)
GFR calc Af Amer: 99 mL/min/1.73
GFR calc non Af Amer: 86 mL/min/1.73
Glucose: 101 mg/dL — ABNORMAL HIGH (ref 65–99)
Potassium: 4 mmol/L (ref 3.5–5.2)
Sodium: 139 mmol/L (ref 134–144)

## 2018-05-03 ENCOUNTER — Other Ambulatory Visit: Payer: Self-pay | Admitting: Family Medicine

## 2018-05-12 ENCOUNTER — Other Ambulatory Visit: Payer: Self-pay | Admitting: Cardiology

## 2018-05-12 MED ORDER — LOSARTAN POTASSIUM 50 MG PO TABS: ORAL_TABLET | ORAL | 11 refills | Status: DC

## 2018-05-26 ENCOUNTER — Ambulatory Visit (INDEPENDENT_AMBULATORY_CARE_PROVIDER_SITE_OTHER): Payer: Medicare Other | Admitting: Pharmacist

## 2018-05-26 VITALS — BP 160/72 | HR 69

## 2018-05-26 DIAGNOSIS — I10 Essential (primary) hypertension: Secondary | ICD-10-CM

## 2018-05-26 DIAGNOSIS — I200 Unstable angina: Secondary | ICD-10-CM

## 2018-05-26 NOTE — Patient Instructions (Signed)
It was nice to see you today  Your blood pressure goal is < 130/73mmHg  Continue taking losartan 25mg  in the morning. Move your evening 50mg  dose to right when you go to bed  Start using your bicep blood pressure cuff and continue to record your blood pressure readings at home. If your top (systolic) readings is less than 110, sit for a few minutes and recheck your blood pressure  Follow up in clinic in 1 month for a blood pressure check - please bring in your home readings and bicep blood pressure cuff to this visit

## 2018-05-26 NOTE — Progress Notes (Signed)
Patient ID: Brittany Kirk                 DOB: 06-16-1945                      MRN: [id number]     HPI: Brittany Kirk is a 72 y.o. female patient of Dr. Gwenlyn Found who presents today for hypertension evaluation. PMH significant for CAD in native artery, HLD, HTN, and carotid disease. At last visit with Dr. Gwenlyn Found, blood pressure was elevated at 162/74 mmHg  in office and losartan was increased to 100mg  daily. She was asked to check her blood pressure at home and keep a log. At last visit in HTN clinic, pt reported elevated BP readings in the AM and low readings in the evening. Losartan dose was decreased from 100mg  once daily to 25mg  AM and 50mg  PM.  Patient presents today in good spirits although she has been a bit stressed lately. BP readings less labile than last visit - pt reports feeling well overall and she has only had 2 readings in the past 2 weeks with systolic BP < 409. Home readings are still trending down throughout the day but fewer low readings of SBP < 100. Most during the day are 811-91 systolic, range 478-295A and drop to ~110 before bed. She is asymptomatic during these times. She had 2 low readings in the past 2 weeks of 94/44 and 98/45, high of 157/87. She takes her AM dose of losartan at 8:30am and her PM dose at 6pm. Low BP readings tend to occur most frequently ~8pm.  She brings in her wrist cuff today - 2 home readings show systolic BP in the 213-086V. Clinic systolic reading 784 confirmed x2. Question accuracy of home wrist cuff. She does have a bicep cuff as well, although this is a few years older than her wrist cuff which she purchased a few months ago.  Current HTN meds: losartan 25mg  AM and 50mg  PM  Previously tried: carvedilol 12.5mg  BID (bradycardia), HCTZ 12.5-25mg  daily (hypotension), furosemide 20mg  daily (unknown), metoprolol succinate 12.5mg  daily (bradycardia), propranolol 80mg  daily (bradycardia), ramipril 5mg  daily (unknown), triamterene-hctz 37.5-25mg  daily  (unknown)  BP goal: <130/80 mmHg  Family History: multiple siblings have died from MI at a premature age (brothers - 49, 78s, 68; sister - 52, 12), paternal grandfather (CVD), maternal grandfather (MI, 69)  Social History: 20 pack year smoking history, quit in 2012. Denies alcohol use, denies illicit drug use.  Diet: Eats mostly from home. Cooks chicken, pastas, vegetables/salads. Eats some salted nuts when blood pressure gets low, does not salt food when she is cooking. Drinks 1 cup coffee in the mornings, occasionally 1 small Coke; drinks mostly water.   Exercise: Walks around the house for 20-25 minutes, is active around the house (does lots of laundry).   Home BP readings: mornings - 120s-140s/60s-80s (highest 155/74), evenings - 90s-100s/40s-50s (lowest 100/33, 92/43)  Wt Readings from Last 3 Encounters:  04/01/18 112 lb (50.8 kg)  02/28/18 111 lb (50.3 kg)  02/21/18 110 lb (49.9 kg)   BP Readings from Last 3 Encounters:  04/29/18 130/78  04/01/18 (!) 162/74  02/28/18 (!) 166/80   Pulse Readings from Last 3 Encounters:  04/29/18 64  04/01/18 72  02/28/18 80    Renal function: CrCl cannot be calculated (Patient's most recent lab result is older than the maximum 21 days allowed.).  Past Medical History:  Diagnosis Date  . Allergic rhinitis   .  Anxiety   . Asymptomatic bilateral carotid artery stenosis    40-59% (02/2018)  . Breast cancer ([place]) 2015   Right Breast Cancer  . CAD (coronary artery disease)   . Cancer ([place])   . Depression   . Emphysema    no longer uses inhalers  . Gallstones    nausea, pain upper right abdomen  . GERD (gastroesophageal reflux disease)   . H/O hiatal hernia   . History of skin cancer   . Hyperlipidemia   . Hypertension   . Multinodular goiter (nontoxic)   . Tobacco user   . Wears dentures    top    Current Outpatient Medications on File Prior to Visit  Medication Sig Dispense Refill  . anastrozole (ARIMIDEX) 1 MG tablet Take  1 tablet (1 mg total) by mouth daily. 90 tablet 3  . aspirin 81 MG tablet Take 81 mg by mouth daily.    Marland Kitchen atorvastatin (LIPITOR) 40 MG tablet TAKE 1 TABLET BY MOUTH EVERYDAY AT BEDTIME 90 tablet 1  . Cyanocobalamin (B-12) 2500 MCG TABS Take 2,500 mcg by mouth daily.    . diazepam (VALIUM) 5 MG tablet Take 5 mg by mouth as needed.   3  . DULoxetine (CYMBALTA) 60 MG capsule TAKE 1 CAPSULE BY MOUTH EVERY DAY 90 capsule 2  . ibuprofen (ADVIL,MOTRIN) 200 MG tablet Take 400 mg by mouth daily as needed for headache or moderate pain.    Marland Kitchen losartan (COZAAR) 50 MG tablet Take 1/2 tablet by mouth in the morning and 1 tablet by mouth in the evening. 45 tablet 11  . Multiple Vitamin (MULTIVITAMIN) tablet Take 1 tablet by mouth daily.    Marland Kitchen omeprazole (PRILOSEC) 40 MG capsule Take 40 mg by mouth daily at 3 pm.     No current facility-administered medications on file prior to visit.     Allergies  Allergen Reactions  . Penicillins Anaphylaxis, Itching, Swelling and Rash    Has patient had a PCN reaction causing immediate rash, facial/tongue/throat swelling, SOB or lightheadedness with hypotension: Yes Has patient had a PCN reaction causing severe rash involving mucus membranes or skin necrosis: No Has patient had a PCN reaction that required hospitalization: Yes Has patient had a PCN reaction occurring within the last 10 years: No If all of the above answers are "NO", then may proceed with Cephalosporin use.   . Cefaclor Itching, Swelling and Rash  . Latex Itching and Rash  . Metronidazole Itching, Swelling and Rash  . Naproxen Itching, Swelling and Rash  . Nsaids Itching, Swelling and Rash    Ibuprofen IS tolerated  . Septra [Bactrim] Itching, Swelling and Rash  . Sulfonamide Derivatives Itching, Swelling and Rash  . Tape Itching and Rash    There were no vitals taken for this visit.   Assessment/Plan:  1. Hypertension: BP elevated above goal <130/10mmHg, clinic reading [id number] higher  systolic than home wrist cuff, confirmed x2 checks with each cuff. Most readings at home are at goal, however the accuracy of these readings is in question based on BP comparison today in clinic. Will continue losartan 25mg  AM for now and 50mg  PM, however advised pt to move her PM dose to bedtime at 10pm in the hopes that this will decrease hypotensive episodes occurring ~8pm. Pt will start monitoring her BP with her bicep cuff and will bring this to f/u visit in HTN clinic in 4 weeks.   Feliberto Stockley E. Faisal Stradling, PharmD, BCACP, [place] [phone]  [address], [place], [place] [address] Phone: [phone]; Fax: [phone] 05/26/2018 2:46 PM

## 2018-06-13 ENCOUNTER — Telehealth: Payer: Self-pay | Admitting: Oncology

## 2018-06-13 NOTE — Telephone Encounter (Signed)
Patient called to reschedule cancelled appointments for September 2019 - patient also wants to see GM. Gave patient appointment for lab/fu 1/22.

## 2018-06-23 NOTE — Progress Notes (Signed)
Patient ID: Brittany Kirk                 DOB: [birth year]                      MRN: [id number]     HPI: Brittany Kirk is a 73 y.o. female referred by Dr. Gwenlyn Found to HTN clinic. PMH is significant for CAD in native artery, HLD, HTN, and carotid disease. Patient has history of hypotension accompanied by headaches and dizziness, which often occur right before bed time. Patient was last seen in clinic in December where fewer low readings of SBP <100 were noted. Also, home wrist cuff may not be accurate as it reads 20-30 mg lower than clinic cuff. Advised patient to use bicep cuff even though it is older than the wrist cuff. Patient presents today for follow up.  Patient is in good spirits today. Denies headache, vision changes, or imbalances. Patient reports improved dizziness. Patient reports compliance and takes morning medication doses at 830-9 AM and evening doses at 930-10 PM right before bedtime. Patient has been using the home bicep cuff since the last visit. In clinic, home BP cuff reads BP 165/75 HR 65, which reads ~5 mmHg lower than clinic BP cuff.  Current HTN meds: losartan 25 mg qAM and 50 mg qPM  Previously tried: carvedilol 12.5mg  BID (bradycardia), HCTZ 12.5-25mg  daily (hypotension), furosemide 20mg  daily (unknown), metoprolol succinate 12.5mg  daily (bradycardia), propranolol 80mg  daily (bradycardia), ramipril 5mg  daily (unknown), triamterene-hctz 37.5-25mg  daily (unknown)  BP goal: <130/80 mmHg  Family History: multiple siblings have died from MI at a premature age (brothers - 65, 57s, 61; sister - 80, 18), paternal grandfather (CVD), maternal grandfather (MI, 29)  Social History: 20 pack year smoking history, quit in 2012. Denies alcohol use, denies illicit drug use.  Diet: Eats mostly from home. Cooks chicken, pastas, vegetables/salads. Eats some salted nuts when blood pressure gets low, does not salt food when she is cooking. Drinks 1 cup coffee in the mornings, occasionally 1  small Coke; drinks mostly water.   Exercise: Walks around the house for 20-25 minutes, is active around the house (does lots of laundry).   Home BP readings: BP reads higher in the morning and gradually improves over the afternoon and evening. Highest BP 169/65 (06/12/18 AM). Lowest BP 99/49 (06/14/18 PM). Average BPs 150-160s/70s. Average HR 60-70.  Wt Readings from Last 3 Encounters:  04/01/18 112 lb (50.8 kg)  02/28/18 111 lb (50.3 kg)  02/21/18 110 lb (49.9 kg)   BP Readings from Last 3 Encounters:  05/26/18 (!) 160/72  04/29/18 130/78  04/01/18 (!) 162/74   Pulse Readings from Last 3 Encounters:  05/26/18 69  04/29/18 64  04/01/18 72    Renal function: CrCl cannot be calculated (Patient's most recent lab result is older than the maximum 21 days allowed.).  Past Medical History:  Diagnosis Date  . Allergic rhinitis   . Anxiety   . Asymptomatic bilateral carotid artery stenosis    40-59% (02/2018)  . Breast cancer ([facility]) 2015   Right Breast Cancer  . CAD (coronary artery disease)   . Cancer ([facility])   . Depression   . Emphysema    no longer uses inhalers  . Gallstones    nausea, pain upper right abdomen  . GERD (gastroesophageal reflux disease)   . H/O hiatal hernia   . History of skin cancer   . Hyperlipidemia   . Hypertension   .  Multinodular goiter (nontoxic)   . Tobacco user   . Wears dentures    top    Current Outpatient Medications on File Prior to Visit  Medication Sig Dispense Refill  . anastrozole (ARIMIDEX) 1 MG tablet Take 1 tablet (1 mg total) by mouth daily. 90 tablet 3  . aspirin 81 MG tablet Take 81 mg by mouth daily.    Marland Kitchen atorvastatin (LIPITOR) 40 MG tablet TAKE 1 TABLET BY MOUTH EVERYDAY AT BEDTIME 90 tablet 1  . Cyanocobalamin (B-12) 2500 MCG TABS Take 2,500 mcg by mouth daily.    . diazepam (VALIUM) 5 MG tablet Take 5 mg by mouth as needed.   3  . DULoxetine (CYMBALTA) 60 MG capsule TAKE 1 CAPSULE BY MOUTH EVERY DAY 90 capsule 2  . ibuprofen  (ADVIL,MOTRIN) 200 MG tablet Take 400 mg by mouth daily as needed for headache or moderate pain.    Marland Kitchen losartan (COZAAR) 50 MG tablet Take 1/2 tablet by mouth in the morning and 1 tablet by mouth in the evening. 45 tablet 11  . Multiple Vitamin (MULTIVITAMIN) tablet Take 1 tablet by mouth daily.    Marland Kitchen omeprazole (PRILOSEC) 40 MG capsule Take 40 mg by mouth daily at 3 pm.     No current facility-administered medications on file prior to visit.     Allergies  Allergen Reactions  . Penicillins Anaphylaxis, Itching, Swelling and Rash    Has patient had a PCN reaction causing immediate rash, facial/tongue/throat swelling, SOB or lightheadedness with hypotension: Yes Has patient had a PCN reaction causing severe rash involving mucus membranes or skin necrosis: No Has patient had a PCN reaction that required hospitalization: Yes Has patient had a PCN reaction occurring within the last 10 years: No If all of the above answers are "NO", then may proceed with Cephalosporin use.   . Cefaclor Itching, Swelling and Rash  . Latex Itching and Rash  . Metronidazole Itching, Swelling and Rash  . Naproxen Itching, Swelling and Rash  . Nsaids Itching, Swelling and Rash    Ibuprofen IS tolerated  . Septra [Bactrim] Itching, Swelling and Rash  . Sulfonamide Derivatives Itching, Swelling and Rash  . Tape Itching and Rash     Assessment/Plan:  1. Hypertension - BP above goal <130/80. Both home BP cuff and clinic BP cuffs are reading higher than goal. Per home BP log, readings are much higher in the mornings and BP improves over time in the afternoon and in the evening. Patient has several intolerances to antihypertensive medications. Will increase losartan to 100 mg QHS. Will follow up in 1-2 weeks via telephone and initiate spironolactone 25 mg daily if BP stays elevated. Will schedule f/u visit at that time.  Patient seen in clinic by Almira Bar, PharmD Candidate  Catheryn Slifer E. Lenon Kuennen, PharmD, BCACP, [facility] [address] [place], [place] [address] Phone: [phone]; Fax: [phone] 06/24/2018 3:00 PM

## 2018-06-24 ENCOUNTER — Ambulatory Visit (INDEPENDENT_AMBULATORY_CARE_PROVIDER_SITE_OTHER): Payer: Medicare Other | Admitting: Pharmacist

## 2018-06-24 ENCOUNTER — Other Ambulatory Visit: Payer: Self-pay | Admitting: Cardiovascular Disease

## 2018-06-24 VITALS — BP 172/82 | HR 65

## 2018-06-24 DIAGNOSIS — I10 Essential (primary) hypertension: Secondary | ICD-10-CM | POA: Diagnosis not present

## 2018-06-24 NOTE — Telephone Encounter (Signed)
Losartan 100mg  is not correct dose

## 2018-06-24 NOTE — Patient Instructions (Signed)
It was good to see you today.  Your blood pressure goal is <130/80.  Increase losartan dose to 100 mg and take it right before bed time.   Continue checking your blood pressure daily.  We will call you in 1-2 weeks to see if we need to add spironolactone to control your blood pressure.

## 2018-07-01 NOTE — Progress Notes (Signed)
[facility]  Telephone:([phone]) [phone] [phone]   ID: Nealy Hickmon Harm DOB: 11-23-1945  MR#: [id number]  CZY#:[id number]  Patient Care Team: Susy Frizzle, MD as PCP - General (Family Medicine) Elsie Stain, MD (Pulmonary Disease) Ersel Enslin, Virgie Dad, MD as Consulting Physician (Oncology) Thea Silversmith, MD as Consulting Physician (Radiation Oncology) Dian Queen, MD as Consulting Physician (Obstetrics and Gynecology) Clarene Essex, MD as Consulting Physician (Gastroenterology) OTHER MD: Griselda Miner M.D.   CHIEF COMPLAINT: Early stage estrogen receptor positive breast cancer  CURRENT TREATMENT: anastrozole   BREAST CANCER HISTORY: From the earlier intake note:  Louellen underwent routine screening mammography August of 2015 showing a possible mass in the right breast. On 01/19/2014 at [facility] she underwent right diagnostic mammography and ultrasonography. The breast density was category B. The mass appeared spiculated on spot compression views and an ultrasound it was nearly isoechoic. It was located at the 10:00 position 4 cm from the nipple and measured 7.5 mm by ultrasonography. Ultrasound of the right axilla was negative.  On 01/26/2014 the patient underwent biopsy of the mass in question and this showed ([id number]) and invasive ductal carcinoma, grade 1, estrogen receptor 100% positive, progesterone receptor 60% positive, both with strong staining intensity, with an MIB-1 of 12%, and no HER-2 modification, the signals ratio being 1.38 and the number per cell 3.30.  On 02/19/2014 the patient underwent right lumpectomy. Sentinel lymph node sampling was attempted but failed and accordingly the patient received a full axillary lymph node dissection. The final pathology from this procedure (SZ [id number]. [id number]) showed an invasive ductal carcinoma, 1.4 cm, grade 1, with negative margins, and all 19 lymph nodes clear.  Her subsequent history is as  detailed below   INTERVAL HISTORY: Ohanna returns today for follow-up and treatment of her estrogen receptor positive breast cancer.   She continues on anastrozole. She tolerates this well and without any noticeable side effects. She has been out of medication for a few months now.  Rebekka's last bone density screening on 02/07/2015 at [facility], showed a T-score of -3.2, which is considered osteoporotic.    She decided against by trying Prolia due to the possible side affects.   Her last mammography was on 02/07/2018 at [facility] showing Breast Density Category B. There is no mammographic evidence of malignancy.   Since her last visit here, she underwent a chest xray on 10/11/2017 for chest pain showing mild opacity at the right lung base may reflect atelectasis versus developing infiltrate.  She also underwent a cervical spine MRI without contrast and a lumbar spine MRI without contrast on 11/09/2017 for low back pain showing unchanged minimal cervical spondylosis without stenosis. Very mild lumbar spondylosis and facet arthrosis without evidence of neural impingement.  Finally, she underwent an ultrasound of her thyroid on 03/07/2018 showing findings suggestive of multinodular goiter. None of the discretely measured thyroid nodules meet imaging criteria to recommend percutaneous sampling or continued dedicated follow-up.     REVIEW OF SYSTEMS: Annetta states that she has had some difficulties with her blood pressure. For exercise, she has been walking. She does not belong to a gym. For the holidays, she had family over, and everyone brought something to share. She had some reflux issues at the last appointment, but she was prescribed a medication and has been able to tolerate most foods now. She pulled a muscle in her chest while cleaning her house. The patient denies unusual headaches, visual changes, nausea,  vomiting, or dizziness. There has been no unusual cough, phlegm  production, or pleurisy. This been no change in bowel or bladder habits. The patient denies unexplained fatigue or unexplained weight loss, bleeding, rash, or fever. A detailed review of systems was otherwise noncontributory.    PAST MEDICAL HISTORY: Past Medical History:  Diagnosis Date  . Allergic rhinitis   . Anxiety   . Asymptomatic bilateral carotid artery stenosis    40-59% (02/2018)  . Breast cancer ([facility]) 2015   Right Breast Cancer  . CAD (coronary artery disease)   . Cancer ([facility])   . Depression   . Emphysema    no longer uses inhalers  . Gallstones    nausea, pain upper right abdomen  . GERD (gastroesophageal reflux disease)   . H/O hiatal hernia   . History of skin cancer   . Hyperlipidemia   . Hypertension   . Multinodular goiter (nontoxic)   . Tobacco user   . Wears dentures    top    PAST SURGICAL HISTORY: Past Surgical History:  Procedure Laterality Date  . BREAST LUMPECTOMY Right 2015  . CHOLECYSTECTOMY  07/08/2012   Procedure: LAPAROSCOPIC CHOLECYSTECTOMY WITH INTRAOPERATIVE CHOLANGIOGRAM;  Surgeon: Gayland Curry, MD,FACS;  Location: [facility];  Service: General;  Laterality: N/A;  Laparoscopic Cholecystectomy with Intraoperative Cholangiogram  . COLONOSCOPY    . ESOPHAGOSCOPY N/A 05/29/2017   Procedure: ESOPHAGOSCOPY;  Surgeon: Clarene Essex, MD;  Location: [facility];  Service: Endoscopy;  Laterality: N/A;  botox injection  . HAND SURGERY Left    wrist  . LEFT HEART CATH AND CORONARY ANGIOGRAPHY N/A 10/11/2017   Procedure: LEFT HEART CATH AND CORONARY ANGIOGRAPHY;  Surgeon: Martinique, Peter M, MD;  Location: [facility];  Service: Cardiovascular;  Laterality: N/A;  . TONSILECTOMY, ADENOIDECTOMY, BILATERAL MYRINGOTOMY AND TUBES    . TUBAL LIGATION      FAMILY HISTORY Family History  Problem Relation Age of Onset  . Arthritis Mother   . Heart disease Unknown        5 brothers and 2 sister  . Diverticulitis Sister   . Aplastic anemia Other    The  patient's father died at the age of 37. The patient's mother died at the age of 68 from pancreatic cancer which had been diagnosed a year earlier. The patient had 6 brothers, 2 sisters. There is no history of breast or ovarian cancer in the family.   GYNECOLOGIC HISTORY:  No LMP recorded. Patient is postmenopausal. Menarche age 56, first live birth age 46. The patient is GX P1. She went through menopause at approximately age 27. She did not take hormone replacement.    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 She has a son from her first marriage, [name], who works in [place] as a Dealer. The patient has 2 biological grandchildren, including a 50 year old grandson who is undergoing bone marrow [facility] at [facility] for aplastic anemia 03/25/2014. Vicky's current, second husband, Dorena Bodo"), worked in Arboriculturist. He is now retired. He also has 2 grandchildren from an earlier marriage. He is a Airline pilot. The patient attends a local [place].    ADVANCED DIRECTIVES: Not in place   HEALTH MAINTENANCE: Social History   Tobacco Use  . Smoking status: Former Smoker    Packs/day: 0.50    Years: 40.00    Pack years: 20.00    Types: Cigarettes  Last attempt to quit: 02/13/2011    Years since quitting: 7.3  . Smokeless tobacco: Never Used  Substance Use Topics  . Alcohol use: No  . Drug use: No     Colonoscopy: 07/10/2017 with results showing: LG Intestine -Ascending Colon, Polyp: Sessile serrated polyp (hu).   PAP:  Bone density: At Dr. Christen Butter; osteopenia  Lipid panel:  Allergies  Allergen Reactions  . Penicillins Anaphylaxis, Itching, Swelling and Rash    Has patient had a PCN reaction causing immediate rash, facial/tongue/throat swelling, SOB or lightheadedness with hypotension: Yes Has patient had a PCN reaction causing severe rash involving mucus membranes or skin  necrosis: No Has patient had a PCN reaction that required hospitalization: Yes Has patient had a PCN reaction occurring within the last 10 years: No If all of the above answers are "NO", then may proceed with Cephalosporin use.   . Cefaclor Itching, Swelling and Rash  . Latex Itching and Rash  . Metronidazole Itching, Swelling and Rash  . Naproxen Itching, Swelling and Rash  . Nsaids Itching, Swelling and Rash    Ibuprofen IS tolerated  . Septra [Bactrim] Itching, Swelling and Rash  . Sulfonamide Derivatives Itching, Swelling and Rash  . Tape Itching and Rash    Current Outpatient Medications  Medication Sig Dispense Refill  . anastrozole (ARIMIDEX) 1 MG tablet Take 1 tablet (1 mg total) by mouth daily. 90 tablet 3  . aspirin 81 MG tablet Take 81 mg by mouth daily.    Marland Kitchen atorvastatin (LIPITOR) 40 MG tablet TAKE 1 TABLET BY MOUTH EVERYDAY AT BEDTIME 90 tablet 1  . Cyanocobalamin (B-12) 2500 MCG TABS Take 2,500 mcg by mouth daily.    . diazepam (VALIUM) 5 MG tablet Take 5 mg by mouth as needed.   3  . DULoxetine (CYMBALTA) 60 MG capsule TAKE 1 CAPSULE BY MOUTH EVERY DAY 90 capsule 2  . ibuprofen (ADVIL,MOTRIN) 200 MG tablet Take 400 mg by mouth daily as needed for headache or moderate pain.    Marland Kitchen losartan (COZAAR) 100 MG tablet Take 1 tablet by mouth daily    . Multiple Vitamin (MULTIVITAMIN) tablet Take 1 tablet by mouth daily.    Marland Kitchen omeprazole (PRILOSEC) 40 MG capsule Take 40 mg by mouth daily at 3 pm.     No current facility-administered medications for this visit.     OBJECTIVE: Middle-aged white woman in no acute distress  Vitals:   07/02/18 1317  BP: (!) 168/95  Pulse: 67  Resp: 18  Temp: (!) 97.3 F (36.3 C)  SpO2: 100%     Body mass index is 21.62 kg/m.    ECOG FS:1 - Symptomatic but completely ambulatory  Sclerae unicteric, pupils round and equal No cervical or supraclavicular adenopathy Lungs no rales or rhonchi Heart regular rate and rhythm Abd soft, nontender,  positive bowel sounds MSK no focal spinal tenderness, no upper extremity lymphedema Neuro: nonfocal, well oriented, appropriate affect Breasts: The right breast is status post lumpectomy.  There are no palpable masses.  There are no skin or nipple changes of concern.  The left breast is benign.  Both axillae are benign.   LAB RESULTS:  CMP     Component Value Date/Time   NA 142 07/02/2018 1221   NA 139 04/29/2018 1456   NA 141 02/25/2017 1415   K 4.3 07/02/2018 1221   K 3.9 02/25/2017 1415   CL 105 07/02/2018 1221   CO2 27 07/02/2018 1221   CO2 26 02/25/2017 1415  GLUCOSE 100 (H) 07/02/2018 1221   GLUCOSE 110 02/25/2017 1415   BUN 16 07/02/2018 1221   BUN 15 04/29/2018 1456   BUN 14.1 02/25/2017 1415   CREATININE 0.80 07/02/2018 1221   CREATININE 0.84 02/28/2018 1256   CREATININE 1.0 02/25/2017 1415   CALCIUM 9.1 07/02/2018 1221   CALCIUM 9.2 02/25/2017 1415   PROT 6.5 07/02/2018 1221   PROT 6.2 02/27/2017 0940   PROT 6.5 02/25/2017 1415   ALBUMIN 3.6 07/02/2018 1221   ALBUMIN 3.9 02/27/2017 0940   ALBUMIN 3.5 02/25/2017 1415   AST 14 (L) 07/02/2018 1221   AST 16 02/25/2017 1415   ALT 13 07/02/2018 1221   ALT 10 02/25/2017 1415   ALKPHOS 92 07/02/2018 1221   ALKPHOS 74 02/25/2017 1415   BILITOT 0.6 07/02/2018 1221   BILITOT 0.56 02/25/2017 1415   GFRNONAA >60 07/02/2018 1221   GFRNONAA 61 02/17/2018 1201   GFRAA >60 07/02/2018 1221   GFRAA 70 02/17/2018 1201    I No results found for: SPEP  Lab Results  Component Value Date   WBC 7.0 07/02/2018   NEUTROABS 4.5 07/02/2018   HGB 13.5 07/02/2018   HCT 40.7 07/02/2018   MCV 99.5 07/02/2018   PLT 199 07/02/2018      Chemistry      Component Value Date/Time   NA 142 07/02/2018 1221   NA 139 04/29/2018 1456   NA 141 02/25/2017 1415   K 4.3 07/02/2018 1221   K 3.9 02/25/2017 1415   CL 105 07/02/2018 1221   CO2 27 07/02/2018 1221   CO2 26 02/25/2017 1415   BUN 16 07/02/2018 1221   BUN 15 04/29/2018  1456   BUN 14.1 02/25/2017 1415   CREATININE 0.80 07/02/2018 1221   CREATININE 0.84 02/28/2018 1256   CREATININE 1.0 02/25/2017 1415      Component Value Date/Time   CALCIUM 9.1 07/02/2018 1221   CALCIUM 9.2 02/25/2017 1415   ALKPHOS 92 07/02/2018 1221   ALKPHOS 74 02/25/2017 1415   AST 14 (L) 07/02/2018 1221   AST 16 02/25/2017 1415   ALT 13 07/02/2018 1221   ALT 10 02/25/2017 1415   BILITOT 0.6 07/02/2018 1221   BILITOT 0.56 02/25/2017 1415       No results found for: LABCA2  No components found for: LABCA125  No results for input(s): INR in the last 168 hours.  Urinalysis    Component Value Date/Time   COLORURINE AMBER (A) 07/06/2016 1607    STUDIES: No results found.   ASSESSMENT: 73 y.o. [place] woman status post right upper outer quadrant lumpectomy and axillary lymph node dissection 02/19/2014 for a pT1c pN0, stage IA invasive ductal carcinoma, grade 1, estrogen and progesterone receptor positive, HER-2 not amplified, with an MIB-1 of 12%   (1) Oncotype score of 18 predicts a risk of outside the breast recurrence of 11% if the patient's only systemic treatment is tamoxifen for 5 years. It also suggests no benefit from chemotherapy  (2) the patient opted against adjuvant radiation  (3) anastrozole started 04/18/2014  (a) [facility] 20 03/01/2015 showed a T score of -3.2 (osteoporosis)  (4) malnutrition: Possibly secondary to reflux  (5) osteoporosis: opted against pharmacological interventions    PLAN: Lougenia is now 4-1/2 years out from definitive surgery for her breast cancer with no evidence of disease recurrence.  This is very favorable.  She is tolerating anastrozole well enough that when she accidentally stopped (she ran out  of medication) she noted no changes.  We are going to continue the anastrozole through November of this year.  That will be her 5-year mark.  That means when she sees me next time, that month, she  will be ready to "graduate".  I have encouraged her to participate in the trails to recovery program and also in our yoga and tai chi classes  We discussed blood pressure issues.  She is actively trying to manage this.  The blood pressure here is not reliable.  All our patients are very anxious when they come here.  She does need to check her pressure at home when she is more relaxed and then give that information to her cardiologist  She knows to call for any other issue that may develop before the next visit.    Kamill Fulbright, Virgie Dad, MD  07/02/18 1:53 PM Medical Oncology and Hematology [facility] [address] [place], [place] [address] Tel. [phone]    Fax. [phone]   I, Jacqualyn Posey am acting as a Education administrator for Chauncey Cruel, MD.   I, Lurline Del MD, have reviewed the above documentation for accuracy and completeness, and I agree with the above.

## 2018-07-02 ENCOUNTER — Telehealth: Payer: Self-pay | Admitting: Oncology

## 2018-07-02 ENCOUNTER — Inpatient Hospital Stay: Payer: Medicare Other

## 2018-07-02 ENCOUNTER — Inpatient Hospital Stay: Payer: Medicare Other | Attending: Oncology | Admitting: Oncology

## 2018-07-02 VITALS — BP 168/95 | HR 67 | Temp 97.3°F | Resp 18 | Ht 61.0 in | Wt 114.4 lb

## 2018-07-02 DIAGNOSIS — Z170 Estrogen receptor positive status [ER+]: Principal | ICD-10-CM

## 2018-07-02 DIAGNOSIS — Z7982 Long term (current) use of aspirin: Secondary | ICD-10-CM | POA: Diagnosis not present

## 2018-07-02 DIAGNOSIS — Z87891 Personal history of nicotine dependence: Secondary | ICD-10-CM

## 2018-07-02 DIAGNOSIS — I10 Essential (primary) hypertension: Secondary | ICD-10-CM | POA: Diagnosis not present

## 2018-07-02 DIAGNOSIS — Z79811 Long term (current) use of aromatase inhibitors: Secondary | ICD-10-CM

## 2018-07-02 DIAGNOSIS — Z79899 Other long term (current) drug therapy: Secondary | ICD-10-CM

## 2018-07-02 DIAGNOSIS — C50411 Malignant neoplasm of upper-outer quadrant of right female breast: Secondary | ICD-10-CM

## 2018-07-02 LAB — CMP (CANCER CENTER ONLY)
ALT: 13 U/L (ref 0–44)
AST: 14 U/L — ABNORMAL LOW (ref 15–41)
Albumin: 3.6 g/dL (ref 3.5–5.0)
Alkaline Phosphatase: 92 U/L (ref 38–126)
Anion gap: 10 (ref 5–15)
BUN: 16 mg/dL (ref 8–23)
CO2: 27 mmol/L (ref 22–32)
Calcium: 9.1 mg/dL (ref 8.9–10.3)
Chloride: 105 mmol/L (ref 98–111)
Creatinine: 0.8 mg/dL (ref 0.44–1.00)
GFR, Est AFR Am: >60 mL/min (ref >60)
GFR, Estimated: >60 mL/min (ref >60)
Glucose, Bld: 100 mg/dL — ABNORMAL HIGH (ref 70–99)
Potassium: 4.3 mmol/L (ref 3.5–5.1)
Sodium: 142 mmol/L (ref 135–145)
Total Bilirubin: 0.6 mg/dL (ref 0.3–1.2)
Total Protein: 6.5 g/dL (ref 6.5–8.1)

## 2018-07-02 LAB — CBC WITH DIFFERENTIAL/PLATELET
Abs Immature Granulocytes: 0.02 10*3/uL (ref 0.00–0.07)
Basophils Absolute: 0 10*3/uL (ref 0.0–0.1)
Basophils Relative: 0 %
Eosinophils Absolute: 0 10*3/uL (ref 0.0–0.5)
Eosinophils Relative: 0 %
HCT: 40.7 % (ref 36.0–46.0)
Hemoglobin: 13.5 g/dL (ref 12.0–15.0)
Immature Granulocytes: 0 %
Lymphocytes Relative: 29 %
Lymphs Abs: 2 10*3/uL (ref 0.7–4.0)
MCH: 33 pg (ref 26.0–34.0)
MCHC: 33.2 g/dL (ref 30.0–36.0)
MCV: 99.5 fL (ref 80.0–100.0)
Monocytes Absolute: 0.4 10*3/uL (ref 0.1–1.0)
Monocytes Relative: 6 %
Neutro Abs: 4.5 10*3/uL (ref 1.7–7.7)
Neutrophils Relative %: 65 %
Platelets: 199 10*3/uL (ref 150–400)
RBC: 4.09 MIL/uL (ref 3.87–5.11)
RDW: 12.9 % (ref 11.5–15.5)
WBC: 7 10*3/uL (ref 4.0–10.5)
nRBC: 0 % (ref 0.0–0.2)

## 2018-07-02 MED ORDER — ANASTROZOLE 1 MG PO TABS: 1.0000 mg | ORAL_TABLET | Freq: Every day | ORAL | 3 refills | Status: DC

## 2018-07-02 NOTE — Telephone Encounter (Signed)
Gave avs and calendar ° °

## 2018-07-04 ENCOUNTER — Telehealth: Payer: Self-pay | Admitting: Pharmacist

## 2018-07-04 NOTE — Telephone Encounter (Signed)
Called pt to follow up with BP. Since increasing her losartan to 100mg  daily, she reports the highest her BP has been was 681/59 and 470 systolic at the doctor's office, however many readings have been in the 761H-183U systolic. She has had 7 readings with systolic BP < 373, 1 low of 578 systolic. Will continue current meds and advised pt to continue to monitor BP at home. Will follow up in HTN clinic in the next 2-3 weeks and can consider adding spironolactone at that time if needed. She will bring in her home BP readings and BP cuff to visit.

## 2018-07-10 ENCOUNTER — Ambulatory Visit: Payer: Medicare Other | Admitting: Family Medicine

## 2018-07-14 ENCOUNTER — Ambulatory Visit (INDEPENDENT_AMBULATORY_CARE_PROVIDER_SITE_OTHER): Payer: Medicare Other | Admitting: Family Medicine

## 2018-07-14 ENCOUNTER — Encounter: Payer: Self-pay | Admitting: Family Medicine

## 2018-07-14 VITALS — BP 168/70 | HR 60 | Temp 97.7°F | Resp 14 | Ht 61.0 in | Wt 115.0 lb

## 2018-07-14 DIAGNOSIS — R0989 Other specified symptoms and signs involving the circulatory and respiratory systems: Secondary | ICD-10-CM

## 2018-07-14 DIAGNOSIS — F321 Major depressive disorder, single episode, moderate: Secondary | ICD-10-CM

## 2018-07-14 DIAGNOSIS — F5101 Primary insomnia: Secondary | ICD-10-CM

## 2018-07-14 DIAGNOSIS — E7800 Pure hypercholesterolemia, unspecified: Secondary | ICD-10-CM

## 2018-07-14 LAB — LIPID PANEL
Cholesterol: 133 mg/dL (ref <200)
HDL: 55 mg/dL (ref >=50)
LDL Cholesterol (Calc): 61 mg/dL (calc)
Non-HDL Cholesterol (Calc): 78 mg/dL (calc) (ref <130)
Total CHOL/HDL Ratio: 2.4 (calc) (ref <5.0)
Triglycerides: 88 mg/dL (ref <150)

## 2018-07-14 MED ORDER — DULOXETINE HCL 30 MG PO CPEP: 30.0000 mg | ORAL_CAPSULE | Freq: Every day | ORAL | 3 refills | Status: DC

## 2018-07-14 MED ORDER — TRAZODONE HCL 50 MG PO TABS: 25.0000 mg | ORAL_TABLET | Freq: Every evening | ORAL | 3 refills | Status: DC | PRN

## 2018-07-14 NOTE — Progress Notes (Signed)
Subjective:    Patient ID: Brittany Kirk, female    DOB: [birth year], 73 y.o.   MRN: [id number]  Hypertension    Patient was recently admitted to the hospital.  I have copied relevant portions of the discharge summary and included them below for my reference:  Admit Date: 10/11/2017 Discharge Date: 10/12/2017  Primary Care Provider: Susy Frizzle, MD Primary Cardiologist: Dr. Gwenlyn Found, MD  Discharge Diagnoses    Active Problems:   Unstable angina [facility])  History of Present Illness     73 year old female with history of CAD medically managed as detailed below, emphysema, CKD stage III, HTN, HLD, breast cancer s/p lumpectomy, carotid artery disease, prior tobacco abuse for 25 pack years quitting ~ 3 years prior, strong family history of heart disease with multiple siblings who passed away at a premature age from MIs, and GERD who was admitted to [facility] with chest pain on 10/11/2017.   Patient has history of a remote cardiac cath by Dr. Lia Foyer, MD, in 09/2008, that showed moderate calcification in the coronary arteries, high-grade ostial ramus intermedius at trifurcation location involving LAD, ramus and the AV circumflex. Medical therapy was pursued as lesion was not ideal for percutaneous intervention given his ostial location andit's relationship to the LAD and the left circumflex interface. Most recent ischemic evaluation by [facility] on 01/05/2017 that was negative for ischemia, no EKG changes, EF 83%, low risk scan. Over the fall and winter months of 2018, she had an upper and lower endoscopy that were both unrevealing. She was seen in the office on 5/3 for evaluation of chest pain. At that time, she noted increasing SOB, substernal chest pain that radiated to her back and left upper extremity. Her EKG showed no acute changes. She was transported via EMS to [facility].   Hospital Course     Consultants: none  Upon her arrival to [facility], troponin was cycled and she ruled out.  CXR showed a mild opacity of the right base. She underwent LHC on 10/11/2017 that showed single vessel CAD involving the ostium of the ramus intermediate branch. Cardiac cath was unchanged from prior study in 2010. Given prior concern regarding shifting plaque into the LAD or LCx with PCI. Patient had not been on any antianginal therapy and was noted to be severely hypertensive in the cath lab. Aggressive medical therapy and BP control was advised. She was started on Coreg and Imdur. If she continues to have refractory angina despite optimal medical therapy and BP control, PCI of the ramus could be considered. Post cath labs showed stable renal function. In the evening of 5/3, the patient was hypotensive with systolic BP in the 47W to 80s. There were no complications from her cardiac cath site. PM Coreg was held. She was given IV saline bolus with improvement in BP. Initial BP this morning was soft in the 29F systolic. Coreg was held again. BP 125 mmHg currently. Patient has explained to Dr. Rayann Heman that she only wants to take her prior to admission medications upon discharge and she will not be sent home on any new medications. She has been advised to follow up with Dr. Gwenlyn Found for further medical management (message has been sent to our office).   The patient's right femoral cardiac cath site has been examined and is healing well without issues at this time. The patient has been seen by Dr. Rayann Heman, MD and felt to be stable for discharge today. All follow up appointments have  been made. Discharge medications are listed below. Prescriptions have been reviewed with the patient and sent in to their pharmacy.  _____________  10/28/17 Patient is here today complaining of pain going up and down her back, tremor which is constant, and feeling tired all the time.  I reviewed her past records and she is also had a CT scan of the chest abdomen and pelvis in March of this year which was reassuring.  Those records are copied  below: IMPRESSION: No findings specific for recurrent or metastatic disease.  Status post right axillary lymph node dissection.  Faint subpleural micronodularity measuring up to 3 mm, predominantly in the bilateral upper lobes. Consider follow-up CT chest in 6 months to confirm stability. However, this appearance is not worrisome for metastatic disease.  1.8 cm left adrenal nodule, only minimally increased from 2014, suggesting a benign adrenal adenoma.  Can now is clear  Last year, I had seen the patient for persistent nausea and weight loss.  Work-up at that time revealed no specific etiology.  My working diagnosis was most likely anxiety and stress.  Today the patient states that she has had burning stinging pain radiating from her neck to her tailbone and from her tailbone up to her neck now for several months.  She also reports worsening weakness in her arms and legs.  She reports worsening essential tremor.  This now involves her head and neck as well as both arms.  It worsens with activity and is present with rest.  She reports continued weight loss.  She reports feeling extremely tired and weak.  Patient saw neurology last year and had an MRI of the brain as well as cervical spine that was essentially normal.  Patient states that if I sent her to a neurologist she wants to second opinion.  At that time, my plan was: I spent approximately 30 minutes with the patient today conducting her history and her exam.  I have also spent a great deal of time reviewing her past medical records over the last several months including her referral to neurology, her follow-up with her oncologist, her recent admission to cardiology.  Given her unusual radicular almost neuropathic pain, I reviewed her records and found that she recently had a TSH in May of this year that was normal.  She had a vitamin B12 level checked in October 2018 that was normal.  She had an MRI in February 2018 of her neck that was  normal.  She has had a CT scan of the chest abdomen and pelvis that was normal or at least showed no specific cause of her symptoms.  As I explained to the patient, she has had numerous somatic complaints over the last 12 to 18 months with no specific finding on thorough diagnostic work-up.  I believe this could be related to anxiety, stress, and underlying depression.  Patient disagrees with this assessment.  She states that emotionally she feels fine.  Therefore I will proceed with an MRI of the cervical and lumbar spine to evaluate for any abnormalities that would explain her radicular pain and neuropathic pain that radiates from her neck to her tailbone and from her tailbone to her neck that may also cause her subjective weakness in her arms and legs.  Differential diagnosis includes multiple sclerosis.  Her essential tremor is certainly worsening however I will defer that at the present time until I have the results of her MRI.  If MRI shows no specific cause for symptoms, I  will revisit possible somatization disorder.    11/05/17 Patient called 5/21 and asked me to not order the MRI.  However the patient has an appointment already scheduled for Saturday.  She now states that she would like to keep that appointment.  She is here today continuing to complain of a burning heat warm sensation radiating from her neck to her tailbone and from her tailbone to her neck.  The pain, for lack of a better word, comes and goes with no specific cause and no specific alleviating factor.  She also continues to report nausea.  Nothing has helped her nausea.. Wt Readings from Last 3 Encounters:  07/02/18 114 lb 6.4 oz (51.9 kg)  04/01/18 112 lb (50.8 kg)  02/28/18 111 lb (50.3 kg)   Thankfully her weight has remained relatively stable over the last few weeks as evidenced above.  She denies any vomiting.  She has had an exhaustive GI work-up last year with no specific cause for her nausea.  As I explained to the patient  and her last visit, I believe some the symptoms could be conversion disorder essentially.  I wonder if stress and underlying depression may be causing a myriad of somatic complaints.  She does report depression.  She reports poor energy.  She reports fatigue.  She reports poor appetite.  She denies trouble sleeping.  She denies suicidal ideation.  She does report loss of interest/anhedonia.  She independently discontinued Lexapro due to perceived lack of benefit.   At that time, my plan was: I am not certain of the diagnosis but I am concerned that some of this may be conversion disorder and that some of her physical symptoms could be due to to underlying depression/stress/etc.  I am awaiting the results of the MRI but I have recommended starting the patient on Cymbalta 60 mg a day and rechecking in 4 weeks to see if her symptoms will improve.  I also believe there may be added benefit given some of the effect Cymbalta has been treating neuropathic pain.  Await the results of the MRI as previously scheduled.  12/09/17 Patient discontinued Lexapro and began Cymbalta 60 mg a day.  Patient states that the nausea has improved.  Her resting tremor has improved.  She still has very little appetite and has lost an additional 2 pounds since her last appointment.  She no longer has the neuropathic pain radiating from her head to her tailbone and from her tailbone to her head.  MRI of the cervical spine and lumbar spine revealed only mild spondylosis but no explanation for her symptoms.  Therefore I still believe we are dealing with conversion disorder secondary to uncontrolled depression and anxiety.  I explained this to the patient in length today.  She states that she feels that her depression has not improved since switching to Cymbalta.  She actually feels more depressed in the afternoons than previously.  She denies any panic attacks.  She denies any suicidal ideation.  At that time, my plan was: I am heartened by  the fact the nausea has improved, the pain has improved, and the tremor has improved.  I feel we are on the right track.  Of asked the patient to allow the Cymbalta 2-4 more weeks to take full effect.  At that point if her depression is not any better, I would consider switching the patient to Prozac as an appetite stimulant coupled with Wellbutrin in place of the Cymbalta.  However I would still focus  on treating underlying anxiety and depression as a cause of her physical symptoms as stated earlier.  01/06/18 I am very happy to report that the patient feels much better.  Her depression is improving.  Furthermore her anxiety is improving.  She is taking less and less diazepam.  She has been attending a class at her church regarding depression and anxiety and she feels that this is been extremely beneficial to her being able to talk to other people who have experience what she is experiencing and know how she feels.  The anxiety is improving.  She is sleeping better.  She continues to deny any suicidal ideation.  She states that she feels much happier.  She is still battling depression but it has improved substantially.  She reports less anhedonia.  She reports more energy and better concentration.  At that time, my plan was: I am so happy that the patient is benefiting from treatment.  Continue Cymbalta 60 mg a day.  We did discuss starting a low-dose Klonopin 0.5 mg p.o. twice daily on a scheduled basis to try to help calm anxiety.  Patient is actually feeling better and wants to avoid additional medication and continue on her current treatment regimen for the present time.  I believe this is completely appropriate.  Recheck the patient in 3 to 6 months or as needed.  02/17/18 Saw Cardiology on 02/14/18.  BP was 190/80.  Reviewed their note.  Losartan 50 mg poqday was resumed.  She walked in this morning complaining of dizziness and headache asking for her BP to be checked.  Initially 190/82.  She was then  placed on my schedule.  Patient denies any chest pain.  She denies any shortness of breath.  She denies any oliguria or hematuria.  She denies any vision changes.  The headache is mild and seems to be worse when her blood pressure is high.  Over the weekend, her blood pressure has fluctuated quite a bit.  The lowest she has seen her blood pressure is 140/86.  The highest she is seen his blood pressure is 190/80.  I personally repeated her blood pressure and found it to be 190/82 as well.  She is compliant with the losartan.  On her exam today there is no papilledema.  There is no evidence of endorgan damage.  EKG shows normal sinus rhythm with no ischemia no ST segment elevation, no T wave inversions or ST segment depression, no sign of left ventricular strain.  At that time, my plan was: There is no evidence of endorgan damage.  Therefore I believe the patient is hypertensive urgency.  I have asked her to continue losartan 50 mg a day and supplement with hydrochlorothiazide 25 mg a day.  I will check a CBC as well as a CMP to evaluate for renal dysfunction.  Recheck the patient on Friday.  At that time hopefully we will see her blood pressure in the 528U systolic.  If still elevated in the 190s, I would recommend switching to clonidine and evaluating for renal artery stenosis.  Patient is to go to the emergency room if she develops any symptoms of end organ damage including severe headache, vision changes, chest pain, shortness of breath, oliguria, hematuria.  She agrees.  02/28/18 Patient saw my partner last Friday, September 13.  After the addition of hydrochlorothiazide, her blood pressure began to drop precipitously.  Therefore my partner reduce her hydrochlorothiazide to 12.5 mg and continued her on losartan 50 mg a day.  She presents today with 1 week of additional blood pressures.  Her morning blood pressures are consistently high between 973 and 532 systolic.  Diastolic blood pressures always well  controlled.  She is taking hydrochlorothiazide and losartan at morning around 9:00.  Her afternoon blood pressures are well controlled and at times even low averaging between 992 and 426 systolic.  She is checking her blood pressures 5 and 6 times a day.  I reviewed the results of her carotid Doppler ordered by her cardiologist.  There was bilateral internal carotid artery stenosis of 40 to 59%.  There also coincidental findings of irregular complex nodules and a thyroid ultrasound was recommended.  Patient now wants to discontinue Cymbalta.  She states that she thinks the medication is not helping anymore the Lexapro did and she does not want to stay on the medication.  At that time, my plan was: First I need to work-up the thyroid nodules.  I will obtain a TSH, free T3, and free T4 to determine if they are hot or cold nodules.  I would also obtain a thyroid ultrasound to determine if these nodules require fine-needle biopsy.  Second I am concerned about her labile blood pressure.  If thyroid test is normal, I would consider working up for possible pheochromocytoma and renal artery stenosis.  I will begin by repeating a BMP after the addition of losartan to look for any evidence of renal insufficiency which would be consistent with renal artery stenosis.  I have recommended that she take losartan 50 mg p.o. twice daily to spread the medication out over a 24-hour period more consistently and continue hydrochlorothiazide 12.5 mg in the morning.  I hope that this will help bring down her blood pressures in the morning but avoid hypotension in the evening.  Recheck blood pressure in 1 week.  If blood pressures remain labile, I would proceed with a 24-hour urine collection to evaluate for pheochromocytoma particular given the previous history of a "benign adrenal adenoma" seen on previous CT.  I have also recommended that we not change Cymbalta at the present time to avoid possible confounding symptoms due to  withdrawal of medication or worsening anxiety.  Once we have the blood pressure adequately controlled and the thyroid nodules worked up, we can certainly revisit discontinuation of Cymbalta.  I do not believe the Cymbalta is causing the hypertension.  There is only occasional report of 2% of patients experiencing elevated blood pressure on Cymbalta and I feel that this is unlikely for this patient  07/14/18  Thyroid US: IMPRESSION: 1. Findings suggestive of multinodular goiter. 2. None of the discretely measured thyroid nodules meet imaging criteria to recommend percutaneous sampling or continued dedicated follow-up.  Since I last saw the patient, she saw her cardiologist Dr. Gwenlyn Found and is currently seeing the Pharm.D. at the cardiology clinic to better manage her blood pressure.  They have increased her losartan 100 mg a day.  She continues to have morning blood pressures in the 60-1 70 range with the evening blood pressures with a systolic blood pressure of 100 despite taking her blood pressure medicine in the evening.  She reports difficulty sleeping.  She states that she will only sleep 1 or 2 hours and then she awakens and is unable to go back to sleep.  She also reports a dry mouth.  She is not using Valium.  She wants to wean off Cymbalta.  She does not find that the Cymbalta is helping her anxiety or her  depression.  She like to see how she would do off the medication.  She denies any chest pain shortness of breath or dyspnea on exertion.  Past Medical History:  Diagnosis Date  . Allergic rhinitis   . Anxiety   . Asymptomatic bilateral carotid artery stenosis    40-59% (02/2018)  . Breast cancer ([place]) 2015   Right Breast Cancer  . CAD (coronary artery disease)   . Cancer ([place])   . Depression   . Emphysema    no longer uses inhalers  . Gallstones    nausea, pain upper right abdomen  . GERD (gastroesophageal reflux disease)   . H/O hiatal hernia   . History of skin cancer   .  Hyperlipidemia   . Hypertension   . Multinodular goiter (nontoxic)   . Tobacco user   . Wears dentures    top   Past Surgical History:  Procedure Laterality Date  . BREAST LUMPECTOMY Right 2015  . CHOLECYSTECTOMY  07/08/2012   Procedure: LAPAROSCOPIC CHOLECYSTECTOMY WITH INTRAOPERATIVE CHOLANGIOGRAM;  Surgeon: Gayland Curry, MD,FACS;  Location: [facility];  Service: General;  Laterality: N/A;  Laparoscopic Cholecystectomy with Intraoperative Cholangiogram  . COLONOSCOPY    . ESOPHAGOSCOPY N/A 05/29/2017   Procedure: ESOPHAGOSCOPY;  Surgeon: Clarene Essex, MD;  Location: [place];  Service: Endoscopy;  Laterality: N/A;  botox injection  . HAND SURGERY Left    wrist  . LEFT HEART CATH AND CORONARY ANGIOGRAPHY N/A 10/11/2017   Procedure: LEFT HEART CATH AND CORONARY ANGIOGRAPHY;  Surgeon: Martinique, Peter M, MD;  Location: [place] CV [facility];  Service: Cardiovascular;  Laterality: N/A;  . TONSILECTOMY, ADENOIDECTOMY, BILATERAL MYRINGOTOMY AND TUBES    . TUBAL LIGATION     Current Outpatient Medications on File Prior to Visit  Medication Sig Dispense Refill  . anastrozole (ARIMIDEX) 1 MG tablet Take 1 tablet (1 mg total) by mouth daily. 90 tablet 3  . aspirin 81 MG tablet Take 81 mg by mouth daily.    Marland Kitchen atorvastatin (LIPITOR) 40 MG tablet TAKE 1 TABLET BY MOUTH EVERYDAY AT BEDTIME 90 tablet 1  . Cyanocobalamin (B-12) 2500 MCG TABS Take 2,500 mcg by mouth daily.    . diazepam (VALIUM) 5 MG tablet Take 5 mg by mouth as needed.   3  . DULoxetine (CYMBALTA) 60 MG capsule TAKE 1 CAPSULE BY MOUTH EVERY DAY 90 capsule 2  . ibuprofen (ADVIL,MOTRIN) 200 MG tablet Take 400 mg by mouth daily as needed for headache or moderate pain.    Marland Kitchen losartan (COZAAR) 100 MG tablet Take 1 tablet by mouth daily    . Multiple Vitamin (MULTIVITAMIN) tablet Take 1 tablet by mouth daily.    Marland Kitchen omeprazole (PRILOSEC) 40 MG capsule Take 40 mg by mouth daily at 3 pm.     No current facility-administered medications on file  prior to visit.    Allergies  Allergen Reactions  . Penicillins Anaphylaxis, Itching, Swelling and Rash    Has patient had a PCN reaction causing immediate rash, facial/tongue/throat swelling, SOB or lightheadedness with hypotension: Yes Has patient had a PCN reaction causing severe rash involving mucus membranes or skin necrosis: No Has patient had a PCN reaction that required hospitalization: Yes Has patient had a PCN reaction occurring within the last 10 years: No If all of the above answers are "NO", then may proceed with Cephalosporin use.   . Cefaclor Itching, Swelling and Rash  . Latex Itching and Rash  . Metronidazole Itching, Swelling and Rash  .  Naproxen Itching, Swelling and Rash  . Nsaids Itching, Swelling and Rash    Ibuprofen IS tolerated  . Septra [Bactrim] Itching, Swelling and Rash  . Sulfonamide Derivatives Itching, Swelling and Rash  . Tape Itching and Rash   Social History   Socioeconomic History  . Marital status: Married    Spouse name: Not on file  . Number of children: 1  . Years of education: Not on file  . Highest education level: Not on file  Occupational History  . Occupation: retired Clinical cytogeneticist: RETIRED  Social Needs  . Financial resource strain: Not on file  . Food insecurity:    Worry: Not on file    Inability: Not on file  . Transportation needs:    Medical: Not on file    Non-medical: Not on file  Tobacco Use  . Smoking status: Former Smoker    Packs/day: 0.50    Years: 40.00    Pack years: 20.00    Types: Cigarettes    Last attempt to quit: 02/13/2011    Years since quitting: 7.4  . Smokeless tobacco: Never Used  Substance and Sexual Activity  . Alcohol use: No  . Drug use: No  . Sexual activity: Not on file  Lifestyle  . Physical activity:    Days per week: Not on file    Minutes per session: Not on file  . Stress: Not on file  Relationships  . Social connections:    Talks on phone: Not on file    Gets  together: Not on file    Attends religious service: Not on file    Active member of club or organization: Not on file    Attends meetings of clubs or organizations: Not on file    Relationship status: Not on file  . Intimate partner violence:    Fear of current or ex partner: Not on file    Emotionally abused: Not on file    Physically abused: Not on file    Forced sexual activity: Not on file  Other Topics Concern  . Not on file  Social History Narrative  . Not on file     Review of Systems  All other systems reviewed and are negative.      Objective:   Physical Exam Vitals signs reviewed.  Constitutional:      General: She is not in acute distress.    Appearance: She is well-developed. She is not diaphoretic.  HENT:     Head: Normocephalic and atraumatic.     Right Ear: External ear normal.     Left Ear: External ear normal.     Mouth/Throat:     Pharynx: No oropharyngeal exudate.  Eyes:     Conjunctiva/sclera: Conjunctivae normal.     Pupils: Pupils are equal, round, and reactive to light.  Neck:     Musculoskeletal: Normal range of motion.     Thyroid: No thyromegaly.     Vascular: No JVD.  Cardiovascular:     Rate and Rhythm: Normal rate and regular rhythm.     Heart sounds: Normal heart sounds. No murmur. No friction rub. No gallop.   Pulmonary:     Effort: Pulmonary effort is normal. No respiratory distress.     Breath sounds: Normal breath sounds. No stridor. No wheezing or rales.  Abdominal:     General: Bowel sounds are normal. There is no distension.     Palpations: Abdomen is soft.  Tenderness: There is no abdominal tenderness. There is no guarding.  Lymphadenopathy:     Cervical: No cervical adenopathy.  Neurological:     Mental Status: She is alert and oriented to person, place, and time.     Cranial Nerves: No cranial nerve deficit.     Sensory: No sensory deficit.     Motor: Tremor present. No atrophy, abnormal muscle tone or seizure  activity.     Deep Tendon Reflexes: Reflexes are normal and symmetric.           Assessment & Plan:  Pure hypercholesterolemia - Plan: Lipid panel  Labile blood pressure  Depression, major, single episode, moderate (HCC)  Primary insomnia  Blood pressure today is still elevated.  I will defer management to her cardiologist that she has an appointment already on Wednesday to see them and discuss this to avoid polypharmacy.  I will wean the patient off Cymbalta and decrease to 30 mg a day for 2 weeks then 30 mg every other day for 2 weeks then discontinue the medication.  If the depression worsens, the patient would like to go back on Lexapro because she feels that that medication worked better.  We will add trazodone 25 to 50 mg p.o. nightly for insomnia and then reassess the patient in 1 month.

## 2018-07-16 ENCOUNTER — Ambulatory Visit (INDEPENDENT_AMBULATORY_CARE_PROVIDER_SITE_OTHER): Payer: Medicare Other | Admitting: Pharmacist

## 2018-07-16 VITALS — BP 134/62 | HR 61

## 2018-07-16 DIAGNOSIS — I10 Essential (primary) hypertension: Secondary | ICD-10-CM | POA: Diagnosis not present

## 2018-07-16 MED ORDER — HYDRALAZINE HCL 25 MG PO TABS: ORAL_TABLET | ORAL | 11 refills | Status: DC

## 2018-07-16 NOTE — Progress Notes (Signed)
Patient ID: Brittany Kirk                 DOB: Oct 28, 1945                      MRN: [id number]     HPI: Brittany Kirk is a 73 y.o. female referred by Dr. Gwenlyn Found to HTN clinic. PMH is significant for CAD in native artery, HLD, HTN, and carotid disease. Patient has a history of hypotension accompanied by headaches and dizziness, which often occur right before bed time. BP tends to run higher in the morning upon awakening. At last visit in HTN clinic 3 weeks ago, BP was elevated at 172/82 and losartan was increased to 100mg  HS. F/u BMET was stable. She presents today for follow up.  Patient is in good spirits today. Denies headache, vision changes, or dizziness. Her BP readings continue to remain elevated in the morning (253-664 systolic) and trend down throughout the day, with 2 occasions of systolic BP in the 40H ~4VQ. She is taking her losartan right before she goes to bed at night as previously instructed. She feels a bit tired when her systolic readings falls to ~100 but no other symptoms. Her home bicep BP cuff read 136/64 in clinic today which was comparable to clinic reading. She only took 50mg  of losartan last night since her BP was low at 97/45.  Current HTN meds: losartan 100mg  at bedtime  Previously tried: carvedilol 12.5mg  BID (bradycardia), HCTZ 12.5-25mg  daily (hypotension), furosemide 20mg  daily (unknown), metoprolol succinate 12.5mg  daily (bradycardia), propranolol 80mg  daily (bradycardia), ramipril 5mg  daily (unknown), triamterene-hctz 37.5-25mg  daily (unknown)  BP goal: <130/80 mmHg  Family History: multiple siblings have died from MI at a premature age (brothers - 8, 45s, 78; sister - 31, 25), paternal grandfather (CVD), maternal grandfather (MI, 57)  Social History: 20 pack year smoking history, quit in 2012. Denies alcohol use, denies illicit drug use.  Diet: Eats mostly from home. Cooks chicken, pastas, vegetables/salads. Eats some salted nuts when blood pressure gets low,  does not salt food when she is cooking. Drinks 1 cup coffee in the mornings, occasionally 1 small Coke; drinks mostly water.   Exercise: Walks around the house for 20-25 minutes, is active around the house (does lots of laundry).   Home BP readings: BP reads higher in the morning and gradually improves over the afternoon and evening. Highest BP 179/67 (AM). Lowest BP 93/48 (PM). Average systolic BPs in AM 259-563, afternoon 120-160, evening 90-120. Average HR 60.  Wt Readings from Last 3 Encounters:  07/14/18 115 lb (52.2 kg)  07/02/18 114 lb 6.4 oz (51.9 kg)  04/01/18 112 lb (50.8 kg)   BP Readings from Last 3 Encounters:  07/14/18 (!) 168/70  07/02/18 (!) 168/95  06/24/18 (!) 172/82   Pulse Readings from Last 3 Encounters:  07/14/18 60  07/02/18 67  06/24/18 65    Renal function: Estimated Creatinine Clearance: 46.6 mL/min (by C-G formula based on SCr of 0.8 mg/dL).  Past Medical History:  Diagnosis Date  . Allergic rhinitis   . Anxiety   . Asymptomatic bilateral carotid artery stenosis    40-59% (02/2018)  . Breast cancer ([place]) 2015   Right Breast Cancer  . CAD (coronary artery disease)   . Cancer ([place])   . Depression   . Emphysema    no longer uses inhalers  . Gallstones    nausea, pain upper right abdomen  . GERD (gastroesophageal reflux disease)   .  H/O hiatal hernia   . History of skin cancer   . Hyperlipidemia   . Hypertension   . Multinodular goiter (nontoxic)   . Tobacco user   . Wears dentures    top    Current Outpatient Medications on File Prior to Visit  Medication Sig Dispense Refill  . anastrozole (ARIMIDEX) 1 MG tablet Take 1 tablet (1 mg total) by mouth daily. 90 tablet 3  . aspirin 81 MG tablet Take 81 mg by mouth daily.    Marland Kitchen atorvastatin (LIPITOR) 40 MG tablet TAKE 1 TABLET BY MOUTH EVERYDAY AT BEDTIME 90 tablet 1  . Cyanocobalamin (B-12) 2500 MCG TABS Take 2,500 mcg by mouth daily.    . diazepam (VALIUM) 5 MG tablet Take 5 mg by mouth as  needed.   3  . DULoxetine (CYMBALTA) 30 MG capsule Take 1 capsule (30 mg total) by mouth daily. 30 capsule 3  . losartan (COZAAR) 100 MG tablet Take 1 tablet by mouth daily    . Multiple Vitamin (MULTIVITAMIN) tablet Take 1 tablet by mouth daily.    Marland Kitchen omeprazole (PRILOSEC) 40 MG capsule Take 40 mg by mouth daily at 3 pm.    . traZODone (DESYREL) 50 MG tablet Take 0.5-1 tablets (25-50 mg total) by mouth at bedtime as needed for sleep. 30 tablet 3   No current facility-administered medications on file prior to visit.     Allergies  Allergen Reactions  . Penicillins Anaphylaxis, Itching, Swelling and Rash    Has patient had a PCN reaction causing immediate rash, facial/tongue/throat swelling, SOB or lightheadedness with hypotension: Yes Has patient had a PCN reaction causing severe rash involving mucus membranes or skin necrosis: No Has patient had a PCN reaction that required hospitalization: Yes Has patient had a PCN reaction occurring within the last 10 years: No If all of the above answers are "NO", then may proceed with Cephalosporin use.   . Cefaclor Itching, Swelling and Rash  . Latex Itching and Rash  . Metronidazole Itching, Swelling and Rash  . Naproxen Itching, Swelling and Rash  . Nsaids Itching, Swelling and Rash    Ibuprofen IS tolerated  . Septra [Bactrim] Itching, Swelling and Rash  . Sulfonamide Derivatives Itching, Swelling and Rash  . Tape Itching and Rash     Assessment/Plan:  1. Hypertension - BP continues to remain elevated in the morning and trend down throughout the day, occasionally dropping low before bedtime. Previously moved losartan 100mg  dosing to bedtime so that it would wear off by the next evening when her BP typically drops. BP trend is still very notable throughout the day, with systolic readings starting 130-170 and falling to 95-120 by the evening. Advised pt that if her systolic BP is ~428 at night, to only take losartan 50mg , otherwise continue  with 100mg  in the evening. Will add on hydralazine 25mg  for patient to take only once a day in the morning if her systolic BP is > 768. I am hopeful that shorter half life of 3-7 hours will help to target elevated BP readings without worsening occasional hypotension at night. Will call pt in 2 weeks to see how she is doing per her preference, and will schedule follow up in pharmacy clinic at that time if needed.   Iqra Rotundo E. Anjelique Makar, PharmD, BCACP, [facility] [address] [place], [place] [address] Phone: [phone]; Fax: [phone] 07/16/2018 7:14 AM

## 2018-07-16 NOTE — Patient Instructions (Addendum)
Continue taking losartan 100mg  every night before bed  If your systolic blood pressure is < 100, check it again within 30 minutes. If it remains ~ 100, only take 50mg  of losartan.  In the morning, if your systolic blood pressure is > 130, take hydralazine 25mg    I will call you in 2 weeks to see how you are doing  If you have any problems before then, call Jinny Blossom, Pharmacist at [phone]

## 2018-07-25 DIAGNOSIS — L600 Ingrowing nail: Secondary | ICD-10-CM | POA: Diagnosis not present

## 2018-07-25 DIAGNOSIS — M25774 Osteophyte, right foot: Secondary | ICD-10-CM | POA: Diagnosis not present

## 2018-07-30 ENCOUNTER — Telehealth: Payer: Self-pay | Admitting: Pharmacist

## 2018-07-30 NOTE — Telephone Encounter (Signed)
Called pt to follow up with BP readings since starting hydralazine in the morning. She reports highest AM BP reading of 153/69 this morning. This was after BP was low last night at 94/48 so she only took losartan 50mg  as previously instructed. For the most part, AM BP readings have been improved and she has used hydralazine 3x over the past week. Mid afternoon readings 124/60, 139/69. Other night BP readings of 104/48 and 115/48. She is feeling well overall and feels like her BP is better controlled. Will continue with current regimen and advised pt to call clinic with any BP related concerns.

## 2018-08-11 ENCOUNTER — Encounter: Payer: Self-pay | Admitting: Family Medicine

## 2018-08-11 ENCOUNTER — Ambulatory Visit (INDEPENDENT_AMBULATORY_CARE_PROVIDER_SITE_OTHER): Payer: Medicare Other | Admitting: Family Medicine

## 2018-08-11 VITALS — BP 188/70 | HR 62 | Temp 97.8°F | Resp 14 | Ht 61.0 in | Wt 115.0 lb

## 2018-08-11 DIAGNOSIS — I10 Essential (primary) hypertension: Secondary | ICD-10-CM | POA: Diagnosis not present

## 2018-08-11 DIAGNOSIS — F411 Generalized anxiety disorder: Secondary | ICD-10-CM | POA: Diagnosis not present

## 2018-08-11 DIAGNOSIS — F321 Major depressive disorder, single episode, moderate: Secondary | ICD-10-CM

## 2018-08-11 DIAGNOSIS — G250 Essential tremor: Secondary | ICD-10-CM

## 2018-08-11 DIAGNOSIS — I7300 Raynaud's syndrome without gangrene: Secondary | ICD-10-CM

## 2018-08-11 MED ORDER — PROPRANOLOL HCL 20 MG PO TABS: 20.0000 mg | ORAL_TABLET | Freq: Three times a day (TID) | ORAL | 0 refills | Status: DC | PRN

## 2018-08-11 MED ORDER — FLUOXETINE HCL 20 MG PO TABS: 20.0000 mg | ORAL_TABLET | Freq: Every day | ORAL | 3 refills | Status: DC

## 2018-08-11 NOTE — Progress Notes (Signed)
Subjective:    Patient ID: Brittany Kirk, female    DOB: [birth year], 73 y.o.   MRN: [id number]  Hypertension    Patient was recently admitted to the hospital.  I have copied relevant portions of the discharge summary and included them below for my reference:  Admit Date: 10/11/2017 Discharge Date: 10/12/2017  Primary Care Provider: Susy Frizzle, Kirk Primary Cardiologist: Brittany Kirk  Discharge Diagnoses    Active Problems:   Unstable angina [facility])  History of Present Illness     73 year old female with history of CAD medically managed as detailed below, emphysema, CKD stage III, HTN, HLD, breast cancer s/p lumpectomy, carotid artery disease, prior tobacco abuse for 25 pack years quitting ~ 3 years prior, strong family history of heart disease with multiple siblings who passed away at a premature age from MIs, and GERD who was admitted to [facility] with chest pain on 10/11/2017.   Patient has history of a remote cardiac cath by Dr. Lia Foyer, Kirk, in 09/2008, that showed moderate calcification in the coronary arteries, high-grade ostial ramus intermedius at trifurcation location involving LAD, ramus and the AV circumflex. Medical therapy was pursued as lesion was not ideal for percutaneous intervention given his ostial location andit's relationship to the LAD and the left circumflex interface. Most recent ischemic evaluation by [facility] on 01/05/2017 that was negative for ischemia, no EKG changes, EF 83%, low risk scan. Over the fall and winter months of 2018, she had an upper and lower endoscopy that were both unrevealing. She was seen in the office on 5/3 for evaluation of chest pain. At that time, she noted increasing SOB, substernal chest pain that radiated to her back and left upper extremity. Her EKG showed no acute changes. She was transported via EMS to [facility].   Hospital Course     Consultants: none  Upon her arrival to [facility], troponin was cycled and she ruled out.  CXR showed a mild opacity of the right base. She underwent LHC on 10/11/2017 that showed single vessel CAD involving the ostium of the ramus intermediate branch. Cardiac cath was unchanged from prior study in 2010. Given prior concern regarding shifting plaque into the LAD or LCx with PCI. Patient had not been on any antianginal therapy and was noted to be severely hypertensive in the cath lab. Aggressive medical therapy and BP control was advised. She was started on Coreg and Imdur. If she continues to have refractory angina despite optimal medical therapy and BP control, PCI of the ramus could be considered. Post cath labs showed stable renal function. In the evening of 5/3, the patient was hypotensive with systolic BP in the 47W to 80s. There were no complications from her cardiac cath site. PM Coreg was held. She was given IV saline bolus with improvement in BP. Initial BP this morning was soft in the 29F systolic. Coreg was held again. BP 125 mmHg currently. Patient has explained to Dr. Rayann Kirk that she only wants to take her prior to admission medications upon discharge and she will not be sent home on any new medications. She has been advised to follow up with Dr. Gwenlyn Kirk for further medical management (message has been sent to our office).   The patient's right femoral cardiac cath site has been examined and is healing well without issues at this time. The patient has been seen by Brittany Kirk and felt to be stable for discharge today. All follow up appointments have  been made. Discharge medications are listed below. Prescriptions have been reviewed with the patient and sent in to their pharmacy.  _____________  10/28/17 Patient is here today complaining of pain going up and down her back, tremor which is constant, and feeling tired all the time.  I reviewed her past records and she is also had a CT scan of the chest abdomen and pelvis in March of this year which was reassuring.  Those records are copied  below: IMPRESSION: No findings specific for recurrent or metastatic disease.  Status post right axillary lymph node dissection.  Faint subpleural micronodularity measuring up to 3 mm, predominantly in the bilateral upper lobes. Consider follow-up CT chest in 6 months to confirm stability. However, this appearance is not worrisome for metastatic disease.  1.8 cm left adrenal nodule, only minimally increased from 2014, suggesting a benign adrenal adenoma.  Can now is clear  Last year, I had seen the patient for persistent nausea and weight loss.  Work-up at that time revealed no specific etiology.  My working diagnosis was most likely anxiety and stress.  Today the patient states that she has had burning stinging pain radiating from her neck to her tailbone and from her tailbone up to her neck now for several months.  She also reports worsening weakness in her arms and legs.  She reports worsening essential tremor.  This now involves her head and neck as well as both arms.  It worsens with activity and is present with rest.  She reports continued weight loss.  She reports feeling extremely tired and weak.  Patient saw neurology last year and had an MRI of the brain as well as cervical spine that was essentially normal.  Patient states that if I sent her to a neurologist she wants to second opinion.  At that time, my plan was: I spent approximately 30 minutes with the patient today conducting her history and her exam.  I have also spent a great deal of time reviewing her past medical records over the last several months including her referral to neurology, her follow-up with her oncologist, her recent admission to cardiology.  Given her unusual radicular almost neuropathic pain, I reviewed her records and Kirk that she recently had a TSH in May of this year that was normal.  She had a vitamin B12 level checked in October 2018 that was normal.  She had an MRI in February 2018 of her neck that was  normal.  She has had a CT scan of the chest abdomen and pelvis that was normal or at least showed no specific cause of her symptoms.  As I explained to the patient, she has had numerous somatic complaints over the last 12 to 18 months with no specific finding on thorough diagnostic work-up.  I believe this could be related to anxiety, stress, and underlying depression.  Patient disagrees with this assessment.  She states that emotionally she feels fine.  Therefore I will proceed with an MRI of the cervical and lumbar spine to evaluate for any abnormalities that would explain her radicular pain and neuropathic pain that radiates from her neck to her tailbone and from her tailbone to her neck that may also cause her subjective weakness in her arms and legs.  Differential diagnosis includes multiple sclerosis.  Her essential tremor is certainly worsening however I will defer that at the present time until I have the results of her MRI.  If MRI shows no specific cause for symptoms, I  will revisit possible somatization disorder.    11/05/17 Patient called 5/21 and asked me to not order the MRI.  However the patient has an appointment already scheduled for Saturday.  She now states that she would like to keep that appointment.  She is here today continuing to complain of a burning heat warm sensation radiating from her neck to her tailbone and from her tailbone to her neck.  The pain, for lack of a better word, comes and goes with no specific cause and no specific alleviating factor.  She also continues to report nausea.  Nothing has helped her nausea.. Wt Readings from Last 3 Encounters:  08/11/18 115 lb (52.2 kg)  07/14/18 115 lb (52.2 kg)  07/02/18 114 lb 6.4 oz (51.9 kg)   Thankfully her weight has remained relatively stable over the last few weeks as evidenced above.  She denies any vomiting.  She has had an exhaustive GI work-up last year with no specific cause for her nausea.  As I explained to the patient  and her last visit, I believe some the symptoms could be conversion disorder essentially.  I wonder if stress and underlying depression may be causing a myriad of somatic complaints.  She does report depression.  She reports poor energy.  She reports fatigue.  She reports poor appetite.  She denies trouble sleeping.  She denies suicidal ideation.  She does report loss of interest/anhedonia.  She independently discontinued Lexapro due to perceived lack of benefit.   At that time, my plan was: I am not certain of the diagnosis but I am concerned that some of this may be conversion disorder and that some of her physical symptoms could be due to to underlying depression/stress/etc.  I am awaiting the results of the MRI but I have recommended starting the patient on Cymbalta 60 mg a day and rechecking in 4 weeks to see if her symptoms will improve.  I also believe there may be added benefit given some of the effect Cymbalta has been treating neuropathic pain.  Await the results of the MRI as previously scheduled.  12/09/17 Patient discontinued Lexapro and began Cymbalta 60 mg a day.  Patient states that the nausea has improved.  Her resting tremor has improved.  She still has very little appetite and has lost an additional 2 pounds since her last appointment.  She no longer has the neuropathic pain radiating from her head to her tailbone and from her tailbone to her head.  MRI of the cervical spine and lumbar spine revealed only mild spondylosis but no explanation for her symptoms.  Therefore I still believe we are dealing with conversion disorder secondary to uncontrolled depression and anxiety.  I explained this to the patient in length today.  She states that she feels that her depression has not improved since switching to Cymbalta.  She actually feels more depressed in the afternoons than previously.  She denies any panic attacks.  She denies any suicidal ideation.  At that time, my plan was: I am heartened by  the fact the nausea has improved, the pain has improved, and the tremor has improved.  I feel we are on the right track.  Of asked the patient to allow the Cymbalta 2-4 more weeks to take full effect.  At that point if her depression is not any better, I would consider switching the patient to Prozac as an appetite stimulant coupled with Wellbutrin in place of the Cymbalta.  However I would still focus  on treating underlying anxiety and depression as a cause of her physical symptoms as stated earlier.  01/06/18 I am very happy to report that the patient feels much better.  Her depression is improving.  Furthermore her anxiety is improving.  She is taking less and less diazepam.  She has been attending a class at her church regarding depression and anxiety and she feels that this is been extremely beneficial to her being able to talk to other people who have experience what she is experiencing and know how she feels.  The anxiety is improving.  She is sleeping better.  She continues to deny any suicidal ideation.  She states that she feels much happier.  She is still battling depression but it has improved substantially.  She reports less anhedonia.  She reports more energy and better concentration.  At that time, my plan was: I am so happy that the patient is benefiting from treatment.  Continue Cymbalta 60 mg a day.  We did discuss starting a low-dose Klonopin 0.5 mg p.o. twice daily on a scheduled basis to try to help calm anxiety.  Patient is actually feeling better and wants to avoid additional medication and continue on her current treatment regimen for the present time.  I believe this is completely appropriate.  Recheck the patient in 3 to 6 months or as needed.  02/17/18 Saw Cardiology on 02/14/18.  BP was 190/80.  Reviewed their note.  Losartan 50 mg poqday was resumed.  She walked in this morning complaining of dizziness and headache asking for her BP to be checked.  Initially 190/82.  She was then  placed on my schedule.  Patient denies any chest pain.  She denies any shortness of breath.  She denies any oliguria or hematuria.  She denies any vision changes.  The headache is mild and seems to be worse when her blood pressure is high.  Over the weekend, her blood pressure has fluctuated quite a bit.  The lowest she has seen her blood pressure is 140/86.  The highest she is seen his blood pressure is 190/80.  I personally repeated her blood pressure and Kirk it to be 190/82 as well.  She is compliant with the losartan.  On her exam today there is no papilledema.  There is no evidence of endorgan damage.  EKG shows normal sinus rhythm with no ischemia no ST segment elevation, no T wave inversions or ST segment depression, no sign of left ventricular strain.  At that time, my plan was: There is no evidence of endorgan damage.  Therefore I believe the patient is hypertensive urgency.  I have asked her to continue losartan 50 mg a day and supplement with hydrochlorothiazide 25 mg a day.  I will check a CBC as well as a CMP to evaluate for renal dysfunction.  Recheck the patient on Friday.  At that time hopefully we will see her blood pressure in the 852D systolic.  If still elevated in the 190s, I would recommend switching to clonidine and evaluating for renal artery stenosis.  Patient is to go to the emergency room if she develops any symptoms of end organ damage including severe headache, vision changes, chest pain, shortness of breath, oliguria, hematuria.  She agrees.  02/28/18 Patient saw my partner last Friday, September 13.  After the addition of hydrochlorothiazide, her blood pressure began to drop precipitously.  Therefore my partner reduce her hydrochlorothiazide to 12.5 mg and continued her on losartan 50 mg a day.  She presents today with 1 week of additional blood pressures.  Her morning blood pressures are consistently high between 536 and 644 systolic.  Diastolic blood pressures always well  controlled.  She is taking hydrochlorothiazide and losartan at morning around 9:00.  Her afternoon blood pressures are well controlled and at times even low averaging between 034 and 742 systolic.  She is checking her blood pressures 5 and 6 times a day.  I reviewed the results of her carotid Doppler ordered by her cardiologist.  There was bilateral internal carotid artery stenosis of 40 to 59%.  There also coincidental findings of irregular complex nodules and a thyroid ultrasound was recommended.  Patient now wants to discontinue Cymbalta.  She states that she thinks the medication is not helping anymore the Lexapro did and she does not want to stay on the medication.  At that time, my plan was: First I need to work-up the thyroid nodules.  I will obtain a TSH, free T3, and free T4 to determine if they are hot or cold nodules.  I would also obtain a thyroid ultrasound to determine if these nodules require fine-needle biopsy.  Second I am concerned about her labile blood pressure.  If thyroid test is normal, I would consider working up for possible pheochromocytoma and renal artery stenosis.  I will begin by repeating a BMP after the addition of losartan to look for any evidence of renal insufficiency which would be consistent with renal artery stenosis.  I have recommended that she take losartan 50 mg p.o. twice daily to spread the medication out over a 24-hour period more consistently and continue hydrochlorothiazide 12.5 mg in the morning.  I hope that this will help bring down her blood pressures in the morning but avoid hypotension in the evening.  Recheck blood pressure in 1 week.  If blood pressures remain labile, I would proceed with a 24-hour urine collection to evaluate for pheochromocytoma particular given the previous history of a "benign adrenal adenoma" seen on previous CT.  I have also recommended that we not change Cymbalta at the present time to avoid possible confounding symptoms due to  withdrawal of medication or worsening anxiety.  Once we have the blood pressure adequately controlled and the thyroid nodules worked up, we can certainly revisit discontinuation of Cymbalta.  I do not believe the Cymbalta is causing the hypertension.  There is only occasional report of 2% of patients experiencing elevated blood pressure on Cymbalta and I feel that this is unlikely for this patient  07/14/18  Thyroid US: IMPRESSION: 1. Findings suggestive of multinodular goiter. 2. None of the discretely measured thyroid nodules meet imaging criteria to recommend percutaneous sampling or continued dedicated follow-up.  Since I last saw the patient, she saw her cardiologist Dr. Gwenlyn Kirk and is currently seeing the Pharm.D. at the cardiology clinic to better manage her blood pressure.  They have increased her losartan 100 mg a day.  She continues to have morning blood pressures in the 60-1 70 range with the evening blood pressures with a systolic blood pressure of 100 despite taking her blood pressure medicine in the evening.  She reports difficulty sleeping.  She states that she will only sleep 1 or 2 hours and then she awakens and is unable to go back to sleep.  She also reports a dry mouth.  She is not using Valium.  She wants to wean off Cymbalta.  She does not find that the Cymbalta is helping her anxiety or her  depression.  She like to see how she would do off the medication.  She denies any chest pain shortness of breath or dyspnea on exertion.  At that time, my plan was: Blood pressure today is still elevated.  I will defer management to her cardiologist that she has an appointment already on Wednesday to see them and discuss this to avoid polypharmacy.  I will wean the patient off Cymbalta and decrease to 30 mg a day for 2 weeks then 30 mg every other day for 2 weeks then discontinue the medication.  If the depression worsens, the patient would like to go back on Lexapro because she feels that that  medication worked better.  We will add trazodone 25 to 50 mg p.o. nightly for insomnia and then reassess the patient in 1 month.  08/11/18 Patient saw cardiology who put her on hydralazine 25 mg 3 times a day for her blood pressure.  Her blood pressure is elevated today at 188/70.  I rechecked it and Kirk it to be 170/64.  However she presents today with several concerns.  Her primary concern is swelling in her ankles and feet.  I removed the patient walks in shoes.  There is no significant swelling today in her ankles or in her feet.  There is no pitting edema.  Patient states that it waxes and wanes.  However there is no appreciable edema on exam today.  What is concerning is that distal to the MTP joints on both feet, the toes are purple.  They also have sluggish capillary refill less than 5 seconds.  Findings are uniform and symmetric bilaterally concerning for Raynaud's phenomenon versus arterial insufficiency to the toes.  Patient denies any pain in the feet but she does state that her legs feel cold all the time.  She also states that her tremor is worsening.  She states that she is more shaky than normal.  She has bilateral essential tremor in both hands.  She also has an essential tremor in her neck and head.  She has never tried any kind of medication for essential tremor.  She has never tried propranolol or primidone.  She also reports feeling anxious inside.  She denies that the anxiety worsened after she decrease the Cymbalta.  She is weaning off the Cymbalta but she states that Cymbalta never helped with anxiety.  The anxiety waxes and wanes.  She did feel that the Lexapro was more beneficial than the Cymbalta but still not sufficient.  Past Medical History:  Diagnosis Date  . Allergic rhinitis   . Anxiety   . Asymptomatic bilateral carotid artery stenosis    40-59% (02/2018)  . Breast cancer ([place]) 2015   Right Breast Cancer  . CAD (coronary artery disease)   . Cancer ([place])   . Depression    . Emphysema    no longer uses inhalers  . Gallstones    nausea, pain upper right abdomen  . GERD (gastroesophageal reflux disease)   . H/O hiatal hernia   . History of skin cancer   . Hyperlipidemia   . Hypertension   . Multinodular goiter (nontoxic)   . Tobacco user   . Wears dentures    top   Past Surgical History:  Procedure Laterality Date  . BREAST LUMPECTOMY Right 2015  . CHOLECYSTECTOMY  07/08/2012   Procedure: LAPAROSCOPIC CHOLECYSTECTOMY WITH INTRAOPERATIVE CHOLANGIOGRAM;  Surgeon: Gayland Curry, Kirk,FACS;  Location: [facility];  Service: General;  Laterality: N/A;  Laparoscopic Cholecystectomy with Intraoperative  Cholangiogram  . COLONOSCOPY    . ESOPHAGOSCOPY N/A 05/29/2017   Procedure: ESOPHAGOSCOPY;  Surgeon: Clarene Essex, Kirk;  Location: [facility];  Service: Endoscopy;  Laterality: N/A;  botox injection  . HAND SURGERY Left    wrist  . LEFT HEART CATH AND CORONARY ANGIOGRAPHY N/A 10/11/2017   Procedure: LEFT HEART CATH AND CORONARY ANGIOGRAPHY;  Surgeon: Martinique, Peter M, Kirk;  Location: [facility];  Service: Cardiovascular;  Laterality: N/A;  . TONSILECTOMY, ADENOIDECTOMY, BILATERAL MYRINGOTOMY AND TUBES    . TUBAL LIGATION     Current Outpatient Medications on File Prior to Visit  Medication Sig Dispense Refill  . anastrozole (ARIMIDEX) 1 MG tablet Take 1 tablet (1 mg total) by mouth daily. 90 tablet 3  . aspirin 81 MG tablet Take 81 mg by mouth daily.    Marland Kitchen atorvastatin (LIPITOR) 40 MG tablet TAKE 1 TABLET BY MOUTH EVERYDAY AT BEDTIME 90 tablet 1  . Cyanocobalamin (B-12) 2500 MCG TABS Take 2,500 mcg by mouth daily.    . diazepam (VALIUM) 5 MG tablet Take 5 mg by mouth as needed.   3  . DULoxetine (CYMBALTA) 30 MG capsule Take 1 capsule (30 mg total) by mouth daily. 30 capsule 3  . hydrALAZINE (APRESOLINE) 25 MG tablet Take 1 tablet by mouth once a day in the morning. 30 tablet 11  . losartan (COZAAR) 100 MG tablet Take 1 tablet by mouth daily    . Multiple  Vitamin (MULTIVITAMIN) tablet Take 1 tablet by mouth daily.    Marland Kitchen omeprazole (PRILOSEC) 40 MG capsule Take 40 mg by mouth daily at 3 pm.    . traZODone (DESYREL) 50 MG tablet Take 0.5-1 tablets (25-50 mg total) by mouth at bedtime as needed for sleep. 30 tablet 3   No current facility-administered medications on file prior to visit.    Allergies  Allergen Reactions  . Penicillins Anaphylaxis, Itching, Swelling and Rash    Has patient had a PCN reaction causing immediate rash, facial/tongue/throat swelling, SOB or lightheadedness with hypotension: Yes Has patient had a PCN reaction causing severe rash involving mucus membranes or skin necrosis: No Has patient had a PCN reaction that required hospitalization: Yes Has patient had a PCN reaction occurring within the last 10 years: No If all of the above answers are "NO", then may proceed with Cephalosporin use.   . Cefaclor Itching, Swelling and Rash  . Latex Itching and Rash  . Metronidazole Itching, Swelling and Rash  . Naproxen Itching, Swelling and Rash  . Nsaids Itching, Swelling and Rash    Ibuprofen IS tolerated  . Septra [Bactrim] Itching, Swelling and Rash  . Sulfonamide Derivatives Itching, Swelling and Rash  . Tape Itching and Rash   Social History   Socioeconomic History  . Marital status: Married    Spouse name: Not on file  . Number of children: 1  . Years of education: Not on file  . Highest education level: Not on file  Occupational History  . Occupation: retired Clinical cytogeneticist: RETIRED  Social Needs  . Financial resource strain: Not on file  . Food insecurity:    Worry: Not on file    Inability: Not on file  . Transportation needs:    Medical: Not on file    Non-medical: Not on file  Tobacco Use  . Smoking status: Former Smoker    Packs/day: 0.50    Years: 40.00    Pack years: 20.00  Types: Cigarettes    Last attempt to quit: 02/13/2011    Years since quitting: 7.4  . Smokeless tobacco:  Never Used  Substance and Sexual Activity  . Alcohol use: No  . Drug use: No  . Sexual activity: Not on file  Lifestyle  . Physical activity:    Days per week: Not on file    Minutes per session: Not on file  . Stress: Not on file  Relationships  . Social connections:    Talks on phone: Not on file    Gets together: Not on file    Attends religious service: Not on file    Active member of club or organization: Not on file    Attends meetings of clubs or organizations: Not on file    Relationship status: Not on file  . Intimate partner violence:    Fear of current or ex partner: Not on file    Emotionally abused: Not on file    Physically abused: Not on file    Forced sexual activity: Not on file  Other Topics Concern  . Not on file  Social History Narrative  . Not on file     Review of Systems  All other systems reviewed and are negative.      Objective:   Physical Exam Vitals signs reviewed.  Constitutional:      General: She is not in acute distress.    Appearance: She is well-developed. She is not diaphoretic.  HENT:     Head: Normocephalic and atraumatic.     Right Ear: External ear normal.     Left Ear: External ear normal.     Mouth/Throat:     Pharynx: No oropharyngeal exudate.  Eyes:     Conjunctiva/sclera: Conjunctivae normal.     Pupils: Pupils are equal, round, and reactive to light.  Neck:     Musculoskeletal: Normal range of motion.     Thyroid: No thyromegaly.     Vascular: No JVD.  Cardiovascular:     Rate and Rhythm: Normal rate and regular rhythm.     Heart sounds: Normal heart sounds. No murmur. No friction rub. No gallop.   Pulmonary:     Effort: Pulmonary effort is normal. No respiratory distress.     Breath sounds: Normal breath sounds. No stridor. No wheezing or rales.  Abdominal:     General: Bowel sounds are normal. There is no distension.     Palpations: Abdomen is soft.     Tenderness: There is no abdominal tenderness. There is  no guarding.  Lymphadenopathy:     Cervical: No cervical adenopathy.  Neurological:     Mental Status: She is alert and oriented to person, place, and time.     Cranial Nerves: No cranial nerve deficit.     Sensory: No sensory deficit.     Motor: Tremor present. No atrophy, abnormal muscle tone or seizure activity.     Deep Tendon Reflexes: Reflexes are normal and symmetric.   Patient has 2/4 dorsalis pedis pulses in both feet.  Feet are warm and well perfused however at the MTP joints, toes become purple with delayed capillary refill than 5 seconds in each toe        Assessment & Plan:  Generalized anxiety disorder with depression.  Discontinue Cymbalta and replace with Prozac 20 mg a day and reassess the patient in 3 weeks.  Essential tremor.  Once the patient is tolerating Prozac I would add propranolol 20 mg 3 times  a day as needed for tremor and elevated blood pressure.  If the propranolol helps, we could then switch to a long-acting dose depending on how much propranolol the patient is taking per day.  Raynaud's phenomenon.  Schedule arterial Dopplers with ABIs and TBI's of the lower extremities to evaluate for any evidence of peripheral artery disease.  If none is present, amlodipine could be used to help treat this however this would exacerbate the swelling with the patient is reporting.  Therefore if there is no significant peripheral artery disease, I would recommend against treating the Raynaud's phenomenon with medication unless it becomes problematic.  Recheck the patient in 3 weeks

## 2018-08-18 ENCOUNTER — Ambulatory Visit (HOSPITAL_COMMUNITY)
Admission: RE | Admit: 2018-08-18 | Discharge: 2018-08-18 | Disposition: A | Discharge status: Home / Self Care | Payer: Medicare Other | Source: Ambulatory Visit | Attending: Family | Admitting: Family

## 2018-08-18 DIAGNOSIS — I7300 Raynaud's syndrome without gangrene: Secondary | ICD-10-CM | POA: Insufficient documentation

## 2018-08-20 DIAGNOSIS — L821 Other seborrheic keratosis: Secondary | ICD-10-CM | POA: Diagnosis not present

## 2018-08-20 DIAGNOSIS — L820 Inflamed seborrheic keratosis: Secondary | ICD-10-CM | POA: Diagnosis not present

## 2018-08-20 DIAGNOSIS — Z85828 Personal history of other malignant neoplasm of skin: Secondary | ICD-10-CM | POA: Diagnosis not present

## 2018-08-20 DIAGNOSIS — L218 Other seborrheic dermatitis: Secondary | ICD-10-CM | POA: Diagnosis not present

## 2018-08-20 DIAGNOSIS — L570 Actinic keratosis: Secondary | ICD-10-CM | POA: Diagnosis not present

## 2018-08-26 ENCOUNTER — Ambulatory Visit (INDEPENDENT_AMBULATORY_CARE_PROVIDER_SITE_OTHER): Payer: Medicare Other | Admitting: Family Medicine

## 2018-08-26 ENCOUNTER — Encounter: Payer: Self-pay | Admitting: Family Medicine

## 2018-08-26 ENCOUNTER — Other Ambulatory Visit: Payer: Self-pay

## 2018-08-26 VITALS — BP 136/64 | HR 54 | Temp 97.5°F | Resp 16 | Ht 61.0 in | Wt 116.0 lb

## 2018-08-26 DIAGNOSIS — I7300 Raynaud's syndrome without gangrene: Secondary | ICD-10-CM

## 2018-08-26 DIAGNOSIS — I10 Essential (primary) hypertension: Secondary | ICD-10-CM

## 2018-08-26 DIAGNOSIS — G250 Essential tremor: Secondary | ICD-10-CM | POA: Diagnosis not present

## 2018-08-26 NOTE — Progress Notes (Signed)
Subjective:    Patient ID: Brittany Kirk, female    DOB: Nov 15, 1945, 73 y.o.   MRN: [id number]  Hypertension    Patient was recently admitted to the hospital.  I have copied relevant portions of the discharge summary and included them below for my reference:  Admit Date: 10/11/2017 Discharge Date: 10/12/2017  Primary Care Provider: Susy Frizzle, MD Primary Cardiologist: Dr. Gwenlyn Found, MD  Discharge Diagnoses    Active Problems:   Unstable angina [facility])  History of Present Illness     73 year old female with history of CAD medically managed as detailed below, emphysema, CKD stage III, HTN, HLD, breast cancer s/p lumpectomy, carotid artery disease, prior tobacco abuse for 25 pack years quitting ~ 3 years prior, strong family history of heart disease with multiple siblings who passed away at a premature age from MIs, and GERD who was admitted to [facility] with chest pain on 10/11/2017.   Patient has history of a remote cardiac cath by Dr. Lia Foyer, MD, in 09/2008, that showed moderate calcification in the coronary arteries, high-grade ostial ramus intermedius at trifurcation location involving LAD, ramus and the AV circumflex. Medical therapy was pursued as lesion was not ideal for percutaneous intervention given his ostial location andit's relationship to the LAD and the left circumflex interface. Most recent ischemic evaluation by [facility] on 01/05/2017 that was negative for ischemia, no EKG changes, EF 83%, low risk scan. Over the fall and winter months of 2018, she had an upper and lower endoscopy that were both unrevealing. She was seen in the office on 5/3 for evaluation of chest pain. At that time, she noted increasing SOB, substernal chest pain that radiated to her back and left upper extremity. Her EKG showed no acute changes. She was transported via EMS to [facility].   Hospital Course     Consultants: none  Upon her arrival to [facility], troponin was cycled and she ruled out.  CXR showed a mild opacity of the right base. She underwent LHC on 10/11/2017 that showed single vessel CAD involving the ostium of the ramus intermediate branch. Cardiac cath was unchanged from prior study in 2010. Given prior concern regarding shifting plaque into the LAD or LCx with PCI. Patient had not been on any antianginal therapy and was noted to be severely hypertensive in the cath lab. Aggressive medical therapy and BP control was advised. She was started on Coreg and Imdur. If she continues to have refractory angina despite optimal medical therapy and BP control, PCI of the ramus could be considered. Post cath labs showed stable renal function. In the evening of 5/3, the patient was hypotensive with systolic BP in the 09X to 80s. There were no complications from her cardiac cath site. PM Coreg was held. She was given IV saline bolus with improvement in BP. Initial BP this morning was soft in the 83J systolic. Coreg was held again. BP 125 mmHg currently. Patient has explained to Dr. Rayann Heman that she only wants to take her prior to admission medications upon discharge and she will not be sent home on any new medications. She has been advised to follow up with Dr. Gwenlyn Found for further medical management (message has been sent to our office).   The patient's right femoral cardiac cath site has been examined and is healing well without issues at this time. The patient has been seen by Dr. Rayann Heman, MD and felt to be stable for discharge today. All follow up appointments have  been made. Discharge medications are listed below. Prescriptions have been reviewed with the patient and sent in to their pharmacy.  _____________  10/28/17 Patient is here today complaining of pain going up and down her back, tremor which is constant, and feeling tired all the time.  I reviewed her past records and she is also had a CT scan of the chest abdomen and pelvis in March of this year which was reassuring.  Those records are copied  below: IMPRESSION: No findings specific for recurrent or metastatic disease.  Status post right axillary lymph node dissection.  Faint subpleural micronodularity measuring up to 3 mm, predominantly in the bilateral upper lobes. Consider follow-up CT chest in 6 months to confirm stability. However, this appearance is not worrisome for metastatic disease.  1.8 cm left adrenal nodule, only minimally increased from 2014, suggesting a benign adrenal adenoma.  Can now is clear  Last year, I had seen the patient for persistent nausea and weight loss.  Work-up at that time revealed no specific etiology.  My working diagnosis was most likely anxiety and stress.  Today the patient states that she has had burning stinging pain radiating from her neck to her tailbone and from her tailbone up to her neck now for several months.  She also reports worsening weakness in her arms and legs.  She reports worsening essential tremor.  This now involves her head and neck as well as both arms.  It worsens with activity and is present with rest.  She reports continued weight loss.  She reports feeling extremely tired and weak.  Patient saw neurology last year and had an MRI of the brain as well as cervical spine that was essentially normal.  Patient states that if I sent her to a neurologist she wants to second opinion.  At that time, my plan was: I spent approximately 30 minutes with the patient today conducting her history and her exam.  I have also spent a great deal of time reviewing her past medical records over the last several months including her referral to neurology, her follow-up with her oncologist, her recent admission to cardiology.  Given her unusual radicular almost neuropathic pain, I reviewed her records and found that she recently had a TSH in May of this year that was normal.  She had a vitamin B12 level checked in October 2018 that was normal.  She had an MRI in February 2018 of her neck that was  normal.  She has had a CT scan of the chest abdomen and pelvis that was normal or at least showed no specific cause of her symptoms.  As I explained to the patient, she has had numerous somatic complaints over the last 12 to 18 months with no specific finding on thorough diagnostic work-up.  I believe this could be related to anxiety, stress, and underlying depression.  Patient disagrees with this assessment.  She states that emotionally she feels fine.  Therefore I will proceed with an MRI of the cervical and lumbar spine to evaluate for any abnormalities that would explain her radicular pain and neuropathic pain that radiates from her neck to her tailbone and from her tailbone to her neck that may also cause her subjective weakness in her arms and legs.  Differential diagnosis includes multiple sclerosis.  Her essential tremor is certainly worsening however I will defer that at the present time until I have the results of her MRI.  If MRI shows no specific cause for symptoms, I  will revisit possible somatization disorder.    11/05/17 Patient called 5/21 and asked me to not order the MRI.  However the patient has an appointment already scheduled for Saturday.  She now states that she would like to keep that appointment.  She is here today continuing to complain of a burning heat warm sensation radiating from her neck to her tailbone and from her tailbone to her neck.  The pain, for lack of a better word, comes and goes with no specific cause and no specific alleviating factor.  She also continues to report nausea.  Nothing has helped her nausea.. Wt Readings from Last 3 Encounters:  08/26/18 116 lb (52.6 kg)  08/11/18 115 lb (52.2 kg)  07/14/18 115 lb (52.2 kg)   Thankfully her weight has remained relatively stable over the last few weeks as evidenced above.  She denies any vomiting.  She has had an exhaustive GI work-up last year with no specific cause for her nausea.  As I explained to the patient and her  last visit, I believe some the symptoms could be conversion disorder essentially.  I wonder if stress and underlying depression may be causing a myriad of somatic complaints.  She does report depression.  She reports poor energy.  She reports fatigue.  She reports poor appetite.  She denies trouble sleeping.  She denies suicidal ideation.  She does report loss of interest/anhedonia.  She independently discontinued Lexapro due to perceived lack of benefit.   At that time, my plan was: I am not certain of the diagnosis but I am concerned that some of this may be conversion disorder and that some of her physical symptoms could be due to to underlying depression/stress/etc.  I am awaiting the results of the MRI but I have recommended starting the patient on Cymbalta 60 mg a day and rechecking in 4 weeks to see if her symptoms will improve.  I also believe there may be added benefit given some of the effect Cymbalta has been treating neuropathic pain.  Await the results of the MRI as previously scheduled.  12/09/17 Patient discontinued Lexapro and began Cymbalta 60 mg a day.  Patient states that the nausea has improved.  Her resting tremor has improved.  She still has very little appetite and has lost an additional 2 pounds since her last appointment.  She no longer has the neuropathic pain radiating from her head to her tailbone and from her tailbone to her head.  MRI of the cervical spine and lumbar spine revealed only mild spondylosis but no explanation for her symptoms.  Therefore I still believe we are dealing with conversion disorder secondary to uncontrolled depression and anxiety.  I explained this to the patient in length today.  She states that she feels that her depression has not improved since switching to Cymbalta.  She actually feels more depressed in the afternoons than previously.  She denies any panic attacks.  She denies any suicidal ideation.  At that time, my plan was: I am heartened by the fact  the nausea has improved, the pain has improved, and the tremor has improved.  I feel we are on the right track.  Of asked the patient to allow the Cymbalta 2-4 more weeks to take full effect.  At that point if her depression is not any better, I would consider switching the patient to Prozac as an appetite stimulant coupled with Wellbutrin in place of the Cymbalta.  However I would still focus on treating  underlying anxiety and depression as a cause of her physical symptoms as stated earlier.  01/06/18 I am very happy to report that the patient feels much better.  Her depression is improving.  Furthermore her anxiety is improving.  She is taking less and less diazepam.  She has been attending a class at her church regarding depression and anxiety and she feels that this is been extremely beneficial to her being able to talk to other people who have experience what she is experiencing and know how she feels.  The anxiety is improving.  She is sleeping better.  She continues to deny any suicidal ideation.  She states that she feels much happier.  She is still battling depression but it has improved substantially.  She reports less anhedonia.  She reports more energy and better concentration.  At that time, my plan was: I am so happy that the patient is benefiting from treatment.  Continue Cymbalta 60 mg a day.  We did discuss starting a low-dose Klonopin 0.5 mg p.o. twice daily on a scheduled basis to try to help calm anxiety.  Patient is actually feeling better and wants to avoid additional medication and continue on her current treatment regimen for the present time.  I believe this is completely appropriate.  Recheck the patient in 3 to 6 months or as needed.  02/17/18 Saw Cardiology on 02/14/18.  BP was 190/80.  Reviewed their note.  Losartan 50 mg poqday was resumed.  She walked in this morning complaining of dizziness and headache asking for her BP to be checked.  Initially 190/82.  She was then placed on my  schedule.  Patient denies any chest pain.  She denies any shortness of breath.  She denies any oliguria or hematuria.  She denies any vision changes.  The headache is mild and seems to be worse when her blood pressure is high.  Over the weekend, her blood pressure has fluctuated quite a bit.  The lowest she has seen her blood pressure is 140/86.  The highest she is seen his blood pressure is 190/80.  I personally repeated her blood pressure and found it to be 190/82 as well.  She is compliant with the losartan.  On her exam today there is no papilledema.  There is no evidence of endorgan damage.  EKG shows normal sinus rhythm with no ischemia no ST segment elevation, no T wave inversions or ST segment depression, no sign of left ventricular strain.  At that time, my plan was: There is no evidence of endorgan damage.  Therefore I believe the patient is hypertensive urgency.  I have asked her to continue losartan 50 mg a day and supplement with hydrochlorothiazide 25 mg a day.  I will check a CBC as well as a CMP to evaluate for renal dysfunction.  Recheck the patient on Friday.  At that time hopefully we will see her blood pressure in the 161W systolic.  If still elevated in the 190s, I would recommend switching to clonidine and evaluating for renal artery stenosis.  Patient is to go to the emergency room if she develops any symptoms of end organ damage including severe headache, vision changes, chest pain, shortness of breath, oliguria, hematuria.  She agrees.  02/28/18 Patient saw my partner last Friday, September 13.  After the addition of hydrochlorothiazide, her blood pressure began to drop precipitously.  Therefore my partner reduce her hydrochlorothiazide to 12.5 mg and continued her on losartan 50 mg a day.  She  presents today with 1 week of additional blood pressures.  Her morning blood pressures are consistently high between 315 and 400 systolic.  Diastolic blood pressures always well controlled.  She is  taking hydrochlorothiazide and losartan at morning around 9:00.  Her afternoon blood pressures are well controlled and at times even low averaging between 867 and 619 systolic.  She is checking her blood pressures 5 and 6 times a day.  I reviewed the results of her carotid Doppler ordered by her cardiologist.  There was bilateral internal carotid artery stenosis of 40 to 59%.  There also coincidental findings of irregular complex nodules and a thyroid ultrasound was recommended.  Patient now wants to discontinue Cymbalta.  She states that she thinks the medication is not helping anymore the Lexapro did and she does not want to stay on the medication.  At that time, my plan was: First I need to work-up the thyroid nodules.  I will obtain a TSH, free T3, and free T4 to determine if they are hot or cold nodules.  I would also obtain a thyroid ultrasound to determine if these nodules require fine-needle biopsy.  Second I am concerned about her labile blood pressure.  If thyroid test is normal, I would consider working up for possible pheochromocytoma and renal artery stenosis.  I will begin by repeating a BMP after the addition of losartan to look for any evidence of renal insufficiency which would be consistent with renal artery stenosis.  I have recommended that she take losartan 50 mg p.o. twice daily to spread the medication out over a 24-hour period more consistently and continue hydrochlorothiazide 12.5 mg in the morning.  I hope that this will help bring down her blood pressures in the morning but avoid hypotension in the evening.  Recheck blood pressure in 1 week.  If blood pressures remain labile, I would proceed with a 24-hour urine collection to evaluate for pheochromocytoma particular given the previous history of a "benign adrenal adenoma" seen on previous CT.  I have also recommended that we not change Cymbalta at the present time to avoid possible confounding symptoms due to withdrawal of medication or  worsening anxiety.  Once we have the blood pressure adequately controlled and the thyroid nodules worked up, we can certainly revisit discontinuation of Cymbalta.  I do not believe the Cymbalta is causing the hypertension.  There is only occasional report of 2% of patients experiencing elevated blood pressure on Cymbalta and I feel that this is unlikely for this patient  07/14/18  Thyroid US: IMPRESSION: 1. Findings suggestive of multinodular goiter. 2. None of the discretely measured thyroid nodules meet imaging criteria to recommend percutaneous sampling or continued dedicated follow-up.  Since I last saw the patient, she saw her cardiologist Dr. Gwenlyn Found and is currently seeing the Pharm.D. at the cardiology clinic to better manage her blood pressure.  They have increased her losartan 100 mg a day.  She continues to have morning blood pressures in the 60-1 70 range with the evening blood pressures with a systolic blood pressure of 100 despite taking her blood pressure medicine in the evening.  She reports difficulty sleeping.  She states that she will only sleep 1 or 2 hours and then she awakens and is unable to go back to sleep.  She also reports a dry mouth.  She is not using Valium.  She wants to wean off Cymbalta.  She does not find that the Cymbalta is helping her anxiety or her depression.  She like to see how she would do off the medication.  She denies any chest pain shortness of breath or dyspnea on exertion.  At that time, my plan was: Blood pressure today is still elevated.  I will defer management to her cardiologist that she has an appointment already on Wednesday to see them and discuss this to avoid polypharmacy.  I will wean the patient off Cymbalta and decrease to 30 mg a day for 2 weeks then 30 mg every other day for 2 weeks then discontinue the medication.  If the depression worsens, the patient would like to go back on Lexapro because she feels that that medication worked better.  We  will add trazodone 25 to 50 mg p.o. nightly for insomnia and then reassess the patient in 1 month.  08/11/18 Patient saw cardiology who put her on hydralazine 25 mg 3 times a day for her blood pressure.  Her blood pressure is elevated today at 188/70.  I rechecked it and found it to be 170/64.  However she presents today with several concerns.  Her primary concern is swelling in her ankles and feet.  I removed the patient walks in shoes.  There is no significant swelling today in her ankles or in her feet.  There is no pitting edema.  Patient states that it waxes and wanes.  However there is no appreciable edema on exam today.  What is concerning is that distal to the MTP joints on both feet, the toes are purple.  They also have sluggish capillary refill less than 5 seconds.  Findings are uniform and symmetric bilaterally concerning for Raynaud's phenomenon versus arterial insufficiency to the toes.  Patient denies any pain in the feet but she does state that her legs feel cold all the time.  She also states that her tremor is worsening.  She states that she is more shaky than normal.  She has bilateral essential tremor in both hands.  She also has an essential tremor in her neck and head.  She has never tried any kind of medication for essential tremor.  She has never tried propranolol or primidone.  She also reports feeling anxious inside.  She denies that the anxiety worsened after she decrease the Cymbalta.  She is weaning off the Cymbalta but she states that Cymbalta never helped with anxiety.  The anxiety waxes and wanes.  She did feel that the Lexapro was more beneficial than the Cymbalta but still not sufficient.  At that time, my plan was: Generalized anxiety disorder with depression.  Discontinue Cymbalta and replace with Prozac 20 mg a day and reassess the patient in 3 weeks.  Essential tremor.  Once the patient is tolerating Prozac I would add propranolol 20 mg 3 times a day as needed for tremor and  elevated blood pressure.  If the propranolol helps, we could then switch to a long-acting dose depending on how much propranolol the patient is taking per day.  Raynaud's phenomenon.  Schedule arterial Dopplers with ABIs and TBI's of the lower extremities to evaluate for any evidence of peripheral artery disease.  If none is present, amlodipine could be used to help treat this however this would exacerbate the swelling with the patient is reporting.  Therefore if there is no significant peripheral artery disease, I would recommend against treating the Raynaud's phenomenon with medication unless it becomes problematic.  Recheck the patient in 3 weeks  08/26/18 Please see the arterial Dopplers of the legs.  Patient had  normal blood flow down to the foot.  However her TBI's were markedly abnormal indicating diminished blood flow past the MTP joint consistent with her diagnosis of Raynaud's disease.  I had suggested starting her on amlodipine to see if this would help with the Raynaud's phenomenon.  However the patient's blood pressure since the addition of propranolol has been dangerously low.  Systolic blood pressure has been down into the low 80s.  The highest she has seen has been 145.  The majority are 80-110.  This is her systolic pressure she reports feeling dizzy and tired.  Her heart rate is also in the 40s to 50s.  She denies any syncope but she does feel lightheaded.  The propranolol helped her tremor however it is obviously dropping her blood pressure and heart rate too much.  Past Medical History:  Diagnosis Date   Allergic rhinitis    Anxiety    Asymptomatic bilateral carotid artery stenosis    40-59% (02/2018)   Breast cancer ([place]) 2015   Right Breast Cancer   CAD (coronary artery disease)    Cancer (HCC)    Depression    Emphysema    no longer uses inhalers   Gallstones    nausea, pain upper right abdomen   GERD (gastroesophageal reflux disease)    H/O hiatal hernia     History of skin cancer    Hyperlipidemia    Hypertension    Multinodular goiter (nontoxic)    Tobacco user    Wears dentures    top   Past Surgical History:  Procedure Laterality Date   BREAST LUMPECTOMY Right 2015   CHOLECYSTECTOMY  07/08/2012   Procedure: LAPAROSCOPIC CHOLECYSTECTOMY WITH INTRAOPERATIVE CHOLANGIOGRAM;  Surgeon: Gayland Curry, MD,FACS;  Location: [facility];  Service: General;  Laterality: N/A;  Laparoscopic Cholecystectomy with Intraoperative Cholangiogram   COLONOSCOPY     ESOPHAGOSCOPY N/A 05/29/2017   Procedure: ESOPHAGOSCOPY;  Surgeon: Clarene Essex, MD;  Location: [facility];  Service: Endoscopy;  Laterality: N/A;  botox injection   HAND SURGERY Left    wrist   LEFT HEART CATH AND CORONARY ANGIOGRAPHY N/A 10/11/2017   Procedure: LEFT HEART CATH AND CORONARY ANGIOGRAPHY;  Surgeon: Martinique, Peter M, MD;  Location: [facility];  Service: Cardiovascular;  Laterality: N/A;   TONSILECTOMY, ADENOIDECTOMY, BILATERAL MYRINGOTOMY AND TUBES     TUBAL LIGATION     Current Outpatient Medications on File Prior to Visit  Medication Sig Dispense Refill   anastrozole (ARIMIDEX) 1 MG tablet Take 1 tablet (1 mg total) by mouth daily. 90 tablet 3   aspirin 81 MG tablet Take 81 mg by mouth daily.     atorvastatin (LIPITOR) 40 MG tablet TAKE 1 TABLET BY MOUTH EVERYDAY AT BEDTIME 90 tablet 1   Cyanocobalamin (B-12) 2500 MCG TABS Take 2,500 mcg by mouth daily.     diazepam (VALIUM) 5 MG tablet Take 5 mg by mouth as needed.   3   FLUoxetine (PROZAC) 20 MG tablet Take 1 tablet (20 mg total) by mouth daily. 30 tablet 3   hydrALAZINE (APRESOLINE) 25 MG tablet Take 1 tablet by mouth once a day in the morning. 30 tablet 11   losartan (COZAAR) 100 MG tablet Take 1 tablet by mouth daily     Multiple Vitamin (MULTIVITAMIN) tablet Take 1 tablet by mouth daily.     omeprazole (PRILOSEC) 40 MG capsule Take 40 mg by mouth daily at 3 pm.     propranolol (INDERAL) 20 MG  tablet  Take 1 tablet (20 mg total) by mouth 3 (three) times daily as needed (tremor or blood pressure). 90 tablet 0   traZODone (DESYREL) 50 MG tablet Take 0.5-1 tablets (25-50 mg total) by mouth at bedtime as needed for sleep. 30 tablet 3   No current facility-administered medications on file prior to visit.    Allergies  Allergen Reactions   Penicillins Anaphylaxis, Itching, Swelling and Rash    Has patient had a PCN reaction causing immediate rash, facial/tongue/throat swelling, SOB or lightheadedness with hypotension: Yes Has patient had a PCN reaction causing severe rash involving mucus membranes or skin necrosis: No Has patient had a PCN reaction that required hospitalization: Yes Has patient had a PCN reaction occurring within the last 10 years: No If all of the above answers are "NO", then may proceed with Cephalosporin use.    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[place] resource strain: Not on file   Food insecurity:    Worry: Not on file    Inability: Not on file   Transportation needs:    Medical: Not on file    Non-medical: Not on file  Tobacco Use   Smoking status: Former Smoker    Packs/day: 0.50    Years: 40.00    Pack years: 20.00    Types: Cigarettes    Last attempt to quit: 02/13/2011    Years since quitting: 7.5   Smokeless tobacco: Never Used  Substance  and Sexual Activity   Alcohol use: No   Drug use: No   Sexual activity: Not on file  Lifestyle   Physical activity:    Days per week: Not on file    Minutes per session: Not on file   Stress: Not on file  Relationships   Social connections:    Talks on phone: Not on file    Gets together: Not on file    Attends religious service: Not on file    Active member of club or organization: Not on file    Attends meetings of clubs or organizations: Not on file    Relationship status: Not on file   Intimate partner violence:    Fear of current or ex partner: Not on file    Emotionally abused: Not on file    Physically abused: Not on file    Forced sexual activity: Not on file  Other Topics Concern   Not on file  Social History Narrative   Not on file     Review of Systems  All other systems reviewed and are negative.      Objective:   Physical Exam Vitals signs reviewed.  Constitutional:      General: She is not in acute distress.    Appearance: She is well-developed. She is not diaphoretic.  HENT:     Head:  Normocephalic and atraumatic.     Right Ear: External ear normal.     Left Ear: External ear normal.     Mouth/Throat:     Pharynx: No oropharyngeal exudate.  Eyes:     Conjunctiva/sclera: Conjunctivae normal.     Pupils: Pupils are equal, round, and reactive to light.  Neck:     Musculoskeletal: Normal range of motion.     Thyroid: No thyromegaly.     Vascular: No JVD.  Cardiovascular:     Rate and Rhythm: Normal rate and regular rhythm.     Heart sounds: Normal heart sounds. No murmur. No friction rub. No gallop.   Pulmonary:     Effort: Pulmonary effort is normal. No respiratory distress.     Breath sounds: Normal breath sounds. No stridor. No wheezing or rales.  Abdominal:     General: Bowel sounds are normal. There is no distension.     Palpations: Abdomen is soft.     Tenderness: There is no abdominal tenderness. There is no guarding.    Lymphadenopathy:     Cervical: No cervical adenopathy.  Neurological:     Mental Status: She is alert and oriented to person, place, and time.     Cranial Nerves: No cranial nerve deficit.     Sensory: No sensory deficit.     Motor: Tremor present. No atrophy, abnormal muscle tone or seizure activity.     Deep Tendon Reflexes: Reflexes are normal and symmetric.   Patient has 2/4 dorsalis pedis pulses in both feet.  Feet are warm and well perfused however at the MTP joints, toes become purple with delayed capillary refill than 5 seconds in each toe        Assessment & Plan:  Raynaud's phenomenon without gangrene  Essential hypertension  Essential tremor  I have asked the patient to discontinue propranolol.  We are stopping this medication due to hypotension and bradycardia.  I explained to her that we must just allow the essential tremor.  I would not want her to take a beta-blocker with her low heart rate.  We discussed primidone however I recommended against it due to side effects.  Her blood pressure if anything is low.  Therefore of asked her to discontinue propranolol.  I would not start amlodipine due to relative hypotension and bradycardia.  Therefore I recommended against any treatment for her Raynaud's phenomenon.  Unless she develops wounds that will not heal, we have elected not to treat the Raynauds.

## 2018-09-01 ENCOUNTER — Ambulatory Visit: Payer: Medicare Other | Admitting: Family Medicine

## 2018-09-02 ENCOUNTER — Other Ambulatory Visit: Payer: Self-pay | Admitting: Family Medicine

## 2018-09-03 ENCOUNTER — Other Ambulatory Visit: Payer: Self-pay | Admitting: *Deleted

## 2018-09-03 ENCOUNTER — Other Ambulatory Visit: Payer: Self-pay | Admitting: Family Medicine

## 2018-09-03 MED ORDER — TRAZODONE HCL 50 MG PO TABS: 25.0000 mg | ORAL_TABLET | Freq: Every evening | ORAL | 3 refills | Status: DC | PRN

## 2018-09-12 ENCOUNTER — Ambulatory Visit (INDEPENDENT_AMBULATORY_CARE_PROVIDER_SITE_OTHER): Payer: Medicare Other | Admitting: Family Medicine

## 2018-09-12 ENCOUNTER — Encounter: Payer: Self-pay | Admitting: Family Medicine

## 2018-09-12 ENCOUNTER — Other Ambulatory Visit: Payer: Self-pay

## 2018-09-12 VITALS — BP 130/60 | HR 68 | Temp 98.4°F | Resp 14 | Ht 61.0 in | Wt 114.0 lb

## 2018-09-12 DIAGNOSIS — M542 Cervicalgia: Secondary | ICD-10-CM

## 2018-09-12 DIAGNOSIS — G4710 Hypersomnia, unspecified: Secondary | ICD-10-CM | POA: Diagnosis not present

## 2018-09-12 MED ORDER — CYCLOBENZAPRINE HCL 10 MG PO TABS: 10.0000 mg | ORAL_TABLET | Freq: Three times a day (TID) | ORAL | 0 refills | Status: DC | PRN

## 2018-09-12 NOTE — Progress Notes (Signed)
Subjective:    Patient ID: Brittany Kirk, female    DOB: 1946/01/13, 73 y.o.   MRN: [id number]  Patient complains of tenderness and pain in the posterior portion of her skull near the attachment of the trapezius muscle on the right-hand side.  She feels a bony lump in that area.  However this bony protuberance is symmetric compared to the left-hand side as well.  I believe this represents the bony protuberance where the trapezius attaches on the occipital bone.  It is just to the right of the midline and is located on the nuchal line.  That area is exquisitely tender to palpation.  It elicits almost nervelike pain when I touch in that area.  The patient denies any falls or injuries.  The pain is been located there for approximately 1 week.  She also states that she has been very sleepy for the last 3 weeks.  No matter how much she sleeps at night, she feels like she can sleep throughout the day.  She also admits to feeling very anxious due to the coronavirus pandemic.  She does not feel that she is resting well.  She denies any snoring.  She denies her husband witnessing any apneic episodes.  She denies any fatigue although she does report hypersomnolence.  She denies any medication changes other than we recently stopped some of her blood pressure medication.  She rarely takes trazodone or Valium and there is no association to the sleepiness with that medication.  She is also not taking any allergy medication right now or Benadryl. Past Medical History:  Diagnosis Date  . Allergic rhinitis   . Anxiety   . Asymptomatic bilateral carotid artery stenosis    40-59% (02/2018)  . Breast cancer ([place]) 2015   Right Breast Cancer  . CAD (coronary artery disease)   . Cancer ([place])   . Depression   . Emphysema    no longer uses inhalers  . Gallstones    nausea, pain upper right abdomen  . GERD (gastroesophageal reflux disease)   . H/O hiatal hernia   . History of skin cancer   . Hyperlipidemia   .  Hypertension   . Multinodular goiter (nontoxic)   . Tobacco user   . Wears dentures    top   Past Surgical History:  Procedure Laterality Date  . BREAST LUMPECTOMY Right 2015  . CHOLECYSTECTOMY  07/08/2012   Procedure: LAPAROSCOPIC CHOLECYSTECTOMY WITH INTRAOPERATIVE CHOLANGIOGRAM;  Surgeon: Gayland Curry, MD,FACS;  Location: [facility];  Service: General;  Laterality: N/A;  Laparoscopic Cholecystectomy with Intraoperative Cholangiogram  . COLONOSCOPY    . ESOPHAGOSCOPY N/A 05/29/2017   Procedure: ESOPHAGOSCOPY;  Surgeon: Clarene Essex, MD;  Location: [facility];  Service: Endoscopy;  Laterality: N/A;  botox injection  . HAND SURGERY Left    wrist  . LEFT HEART CATH AND CORONARY ANGIOGRAPHY N/A 10/11/2017   Procedure: LEFT HEART CATH AND CORONARY ANGIOGRAPHY;  Surgeon: Martinique, Peter M, MD;  Location: [facility];  Service: Cardiovascular;  Laterality: N/A;  . TONSILECTOMY, ADENOIDECTOMY, BILATERAL MYRINGOTOMY AND TUBES    . TUBAL LIGATION     Current Outpatient Medications on File Prior to Visit  Medication Sig Dispense Refill  . anastrozole (ARIMIDEX) 1 MG tablet Take 1 tablet (1 mg total) by mouth daily. 90 tablet 3  . aspirin 81 MG tablet Take 81 mg by mouth daily.    Marland Kitchen atorvastatin (LIPITOR) 40 MG tablet TAKE 1 TABLET BY MOUTH EVERYDAY AT BEDTIME  90 tablet 1  . Cyanocobalamin (B-12) 2500 MCG TABS Take 2,500 mcg by mouth daily.    . diazepam (VALIUM) 5 MG tablet Take 5 mg by mouth as needed.   3  . FLUoxetine (PROZAC) 20 MG capsule TAKE 1 CAPSULE BY MOUTH EVERY DAY 90 capsule 2  . hydrALAZINE (APRESOLINE) 25 MG tablet Take 1 tablet by mouth once a day in the morning. 30 tablet 11  . losartan (COZAAR) 100 MG tablet Take 1 tablet by mouth daily    . Multiple Vitamin (MULTIVITAMIN) tablet Take 1 tablet by mouth daily.    Marland Kitchen omeprazole (PRILOSEC) 40 MG capsule Take 40 mg by mouth daily at 3 pm.    . traZODone (DESYREL) 50 MG tablet Take 0.5-1 tablets (25-50 mg total) by mouth at  bedtime as needed for sleep. 90 tablet 3   No current facility-administered medications on file prior to visit.    Allergies  Allergen Reactions  . Penicillins Anaphylaxis, Itching, Swelling and Rash    Has patient had a PCN reaction causing immediate rash, facial/tongue/throat swelling, SOB or lightheadedness with hypotension: Yes Has patient had a PCN reaction causing severe rash involving mucus membranes or skin necrosis: No Has patient had a PCN reaction that required hospitalization: Yes Has patient had a PCN reaction occurring within the last 10 years: No If all of the above answers are "NO", then may proceed with Cephalosporin use.   . Cefaclor Itching, Swelling and Rash  . Latex Itching and Rash  . Metronidazole Itching, Swelling and Rash  . Naproxen Itching, Swelling and Rash  . Nsaids Itching, Swelling and Rash    Ibuprofen IS tolerated  . Septra [Bactrim] Itching, Swelling and Rash  . Sulfonamide Derivatives Itching, Swelling and Rash  . Tape Itching and Rash   Social History   Socioeconomic History  . Marital status: Married    Spouse name: Not on file  . Number of children: 1  . Years of education: Not on file  . Highest education level: Not on file  Occupational History  . Occupation: retired Clinical cytogeneticist: RETIRED  Social Needs  . Financial resource strain: Not on file  . Food insecurity:    Worry: Not on file    Inability: Not on file  . Transportation needs:    Medical: Not on file    Non-medical: Not on file  Tobacco Use  . Smoking status: Former Smoker    Packs/day: 0.50    Years: 40.00    Pack years: 20.00    Types: Cigarettes    Last attempt to quit: 02/13/2011    Years since quitting: 7.5  . Smokeless tobacco: Never Used  Substance and Sexual Activity  . Alcohol use: No  . Drug use: No  . Sexual activity: Not on file  Lifestyle  . Physical activity:    Days per week: Not on file    Minutes per session: Not on file  .  Stress: Not on file  Relationships  . Social connections:    Talks on phone: Not on file    Gets together: Not on file    Attends religious service: Not on file    Active member of club or organization: Not on file    Attends meetings of clubs or organizations: Not on file    Relationship status: Not on file  . Intimate partner violence:    Fear of current or ex partner: Not on file  Emotionally abused: Not on file    Physically abused: Not on file    Forced sexual activity: Not on file  Other Topics Concern  . Not on file  Social History Narrative  . Not on file     Review of Systems  All other systems reviewed and are negative.      Objective:   Physical Exam Vitals signs reviewed.  Constitutional:      General: She is not in acute distress.    Appearance: She is well-developed. She is not diaphoretic.  HENT:     Head: Normocephalic and atraumatic. No abrasion, contusion, masses or laceration.      Right Ear: Tympanic membrane, ear canal and external ear normal.     Left Ear: Tympanic membrane, ear canal and external ear normal.  Eyes:     Extraocular Movements: Extraocular movements intact.     Conjunctiva/sclera: Conjunctivae normal.     Pupils: Pupils are equal, round, and reactive to light.  Neck:     Musculoskeletal: Normal range of motion.     Thyroid: No thyromegaly.     Vascular: No JVD.  Cardiovascular:     Rate and Rhythm: Normal rate and regular rhythm.     Heart sounds: Normal heart sounds. No murmur. No friction rub. No gallop.   Pulmonary:     Effort: Pulmonary effort is normal. No respiratory distress.     Breath sounds: Normal breath sounds. No stridor. No wheezing or rales.  Lymphadenopathy:     Head:     Right side of head: No posterior auricular or occipital adenopathy.     Left side of head: No posterior auricular or occipital adenopathy.     Cervical: No cervical adenopathy.  Neurological:     Mental Status: She is alert and oriented  to person, place, and time.     Cranial Nerves: No cranial nerve deficit.     Sensory: No sensory deficit.     Motor: Tremor present. No weakness, atrophy, abnormal muscle tone or seizure activity.     Gait: Gait normal.     Deep Tendon Reflexes: Reflexes are normal and symmetric.        Assessment & Plan:  Posterior neck pain  Hypersomnolence  I believe the pain is due to muscle tightness at the origin of the trapezius on the superior nuchal line on the right-hand portion of the occipital bone.  I believe this is likely brought on by stress and anxiety.  I believe that could be also irritating a local cutaneous nerve in that area which is causing almost a nerve like pain in the posterior portion of her head.  Patient had an MRI of the cervical spine in June that I reviewed with her in detail.  However the pain is very superficial and tender to the touch.  I recommended that we treat this with ibuprofen 800 mg every 8 hour for 1 week and also use Flexeril 10 mg p.o. nightly as a muscle relaxer to see if that would relax the trapezius muscle and relieve any muscle tension in that area that is exacerbating the situation.  I believe the hypersomnolence is likely brought on by anxiety as well.  I do not believe the patient's resting well.  I believe she is very anxious regarding the coronavirus.  I have recommended that we simply monitor this at the present time.  If the hypersomnolence persist, I would recommend a sleep study to evaluate for obstructive sleep apnea as  well as possible narcolepsy.  I would not recommend a stimulant unless absolutely necessary as this would likely exacerbate her hypertension and also exacerbate her anxiety and tremor.  Spent more than 25 minutes today with the patient performing her history and physical.  Patient is comfortable with this plan

## 2018-10-21 DIAGNOSIS — Z6822 Body mass index (BMI) 22.0-22.9, adult: Secondary | ICD-10-CM | POA: Diagnosis not present

## 2018-10-21 DIAGNOSIS — Z01419 Encounter for gynecological examination (general) (routine) without abnormal findings: Secondary | ICD-10-CM | POA: Diagnosis not present

## 2018-10-21 DIAGNOSIS — F5104 Psychophysiologic insomnia: Secondary | ICD-10-CM | POA: Diagnosis not present

## 2018-10-23 ENCOUNTER — Telehealth: Payer: Self-pay | Admitting: Cardiology

## 2018-10-23 NOTE — Telephone Encounter (Signed)
LVM for patient to call and schedule 6 month followup with Lurena Joiner.

## 2018-10-23 NOTE — Telephone Encounter (Signed)
My Chart message sent

## 2018-11-30 ENCOUNTER — Other Ambulatory Visit: Payer: Self-pay | Admitting: Family Medicine

## 2018-12-24 ENCOUNTER — Other Ambulatory Visit: Payer: Self-pay

## 2018-12-24 ENCOUNTER — Encounter: Payer: Self-pay | Admitting: Family Medicine

## 2018-12-24 ENCOUNTER — Ambulatory Visit (INDEPENDENT_AMBULATORY_CARE_PROVIDER_SITE_OTHER): Payer: Medicare Other | Admitting: Family Medicine

## 2018-12-24 VITALS — BP 138/60 | HR 52 | Temp 98.0°F | Resp 18 | Ht 59.0 in | Wt 116.8 lb

## 2018-12-24 DIAGNOSIS — N3001 Acute cystitis with hematuria: Secondary | ICD-10-CM

## 2018-12-24 LAB — URINALYSIS, ROUTINE W REFLEX MICROSCOPIC
Bilirubin Urine: NEGATIVE
Glucose, UA: NEGATIVE
Hyaline Cast: NONE SEEN /LPF
Nitrite: NEGATIVE
Protein, ur: NEGATIVE
Specific Gravity, Urine: 1.025 (ref 1.001–1.03)
pH: 5 (ref 5.0–8.0)

## 2018-12-24 LAB — MICROSCOPIC MESSAGE

## 2018-12-24 MED ORDER — CIPROFLOXACIN HCL 500 MG PO TABS: 500.0000 mg | ORAL_TABLET | Freq: Two times a day (BID) | ORAL | 0 refills | Status: AC

## 2018-12-24 NOTE — Patient Instructions (Signed)
Urinary Tract Infection, Adult A urinary tract infection (UTI) is an infection of any part of the urinary tract. The urinary tract includes:  The kidneys.  The ureters.  The bladder.  The urethra. These organs make, store, and get rid of pee (urine) in the body. What are the causes? This is caused by germs (bacteria) in your genital area. These germs grow and cause swelling (inflammation) of your urinary tract. What increases the risk? You are more likely to develop this condition if:  You have a small, thin tube (catheter) to drain pee.  You cannot control when you pee or poop (incontinence).  You are female, and: ? You use these methods to prevent pregnancy: ? A medicine that kills sperm (spermicide). ? A device that blocks sperm (diaphragm). ? You have low levels of a female hormone (estrogen). ? You are pregnant.  You have genes that add to your risk.  You are sexually active.  You take antibiotic medicines.  You have trouble peeing because of: ? A prostate that is bigger than normal, if you are female. ? A blockage in the part of your body that drains pee from the bladder (urethra). ? A kidney stone. ? A nerve condition that affects your bladder (neurogenic bladder). ? Not getting enough to drink. ? Not peeing often enough.  You have other conditions, such as: ? Diabetes. ? A weak disease-fighting system (immune system). ? Sickle cell disease. ? Gout. ? Injury of the spine. What are the signs or symptoms? Symptoms of this condition include:  Needing to pee right away (urgently).  Peeing often.  Peeing small amounts often.  Pain or burning when peeing.  Blood in the pee.  Pee that smells bad or not like normal.  Trouble peeing.  Pee that is cloudy.  Fluid coming from the vagina, if you are female.  Pain in the belly or lower back. Other symptoms include:  Throwing up (vomiting).  No urge to eat.  Feeling mixed up (confused).  Being tired  and grouchy (irritable).  A fever.  Watery poop (diarrhea). How is this treated? This condition may be treated with:  Antibiotic medicine.  Other medicines.  Drinking enough water. Follow these instructions at home:  Medicines  Take over-the-counter and prescription medicines only as told by your doctor.  If you were prescribed an antibiotic medicine, take it as told by your doctor. Do not stop taking it even if you start to feel better. General instructions  Make sure you: ? Pee until your bladder is empty. ? Do not hold pee for a long time. ? Empty your bladder after sex. ? Wipe from front to back after pooping if you are a female. Use each tissue one time when you wipe.  Drink enough fluid to keep your pee pale yellow.  Keep all follow-up visits as told by your doctor. This is important. Contact a doctor if:  You do not get better after 1-2 days.  Your symptoms go away and then come back. Get help right away if:  You have very bad back pain.  You have very bad pain in your lower belly.  You have a fever.  You are sick to your stomach (nauseous).  You are throwing up. Summary  A urinary tract infection (UTI) is an infection of any part of the urinary tract.  This condition is caused by germs in your genital area.  There are many risk factors for a UTI. These include having a small, thin   tube to drain pee and not being able to control when you pee or poop.  Treatment includes antibiotic medicines for germs.  Drink enough fluid to keep your pee pale yellow. This information is not intended to replace advice given to you by your health care provider. Make sure you discuss any questions you have with your health care provider. Document Released: 11/14/2007 Document Revised: 05/15/2018 Document Reviewed: 12/05/2017 Elsevier Patient Education  2020 [employer]  

## 2018-12-24 NOTE — Progress Notes (Signed)
Patient ID: Brittany Kirk, female    DOB: 1946-06-09, 73 y.o.   MRN: [id number]  PCP: Susy Frizzle, MD  Chief Complaint  Patient presents with  . Urinary Tract Infection    07/13 hematuria, dysuria, burning, pain about pelvic area, no meds    Subjective:   Brittany Kirk is a 73 y.o. female, presents to clinic with CC of dysuria, hematuria and bladder pain and pressure x Sat/Sun about 3-4 days ago. No history of UTI    Dysuria  This is a new problem. The problem occurs every urination. The problem has been unchanged. The quality of the pain is described as burning. The pain is moderate. There has been no fever. There is no history of pyelonephritis. Associated symptoms include frequency and urgency. Pertinent negatives include no chills, discharge, flank pain, hematuria, hesitancy, nausea, possible pregnancy, sweats or vomiting. She has tried nothing for the symptoms. There is no history of kidney stones, recurrent UTIs, a single kidney, urinary stasis or a urological procedure.      Patient Active Problem List   Diagnosis Date Noted  . Multinodular goiter (nontoxic)   . Asymptomatic bilateral carotid artery stenosis   . History of depression 02/14/2018  . History of breast cancer 02/14/2018  . Accelerated hypertension 02/14/2018  . Unstable angina ([place]) 10/11/2017  . Caloric malnutrition ([place]) 08/16/2017  . Weight loss, unintentional 03/12/2017  . GERD (gastroesophageal reflux disease) 08/10/2016  . Cough 08/10/2016  . Acute bronchitis 07/12/2016  . Bilateral carotid artery disease ([place]) 02/14/2016  . Chest pain 01/13/2016  . Osteoporosis 02/22/2015  . CKD (chronic kidney disease), stage III ([place]) 08/16/2014  . Malignant neoplasm of upper-outer quadrant of right breast in female, estrogen receptor positive ([place]) 03/22/2014  . Dizziness and giddiness 11/27/2012  . Essential tremor 11/27/2012  . Numbness 11/27/2012  . TOBACCO USER 10/04/2009  . CLOSED FRACTURE  OF ONE RIB 07/12/2009  . Dyslipidemia, goal LDL below 70 09/28/2008  . CAD in native artery 09/28/2008  . Rhinitis 09/09/2008  . COPD (chronic obstructive pulmonary disease) ([place]) 09/07/2008  . Essential hypertension 09/06/2008  . SKIN CANCER, HX OF 09/06/2008     Prior to Admission medications   Medication Sig Start Date End Date Taking? Authorizing Provider  anastrozole (ARIMIDEX) 1 MG tablet Take 1 tablet (1 mg total) by mouth daily. 07/02/18  Yes Magrinat, Virgie Dad, MD  aspirin 81 MG tablet Take 81 mg by mouth daily.   Yes [provider]  atorvastatin (LIPITOR) 40 MG tablet TAKE 1 TABLET BY MOUTH EVERYDAY AT BEDTIME 12/01/18  Yes Susy Frizzle, MD  Cyanocobalamin (B-12) 2500 MCG TABS Take 2,500 mcg by mouth daily.   Yes [provider]  diazepam (VALIUM) 5 MG tablet Take 5 mg by mouth as needed.  07/19/15  Yes [provider]  FLUoxetine (PROZAC) 20 MG capsule TAKE 1 CAPSULE BY MOUTH EVERY DAY 09/02/18  Yes Susy Frizzle, MD  hydrALAZINE (APRESOLINE) 25 MG tablet Take 1 tablet by mouth once a day in the morning. 07/16/18  Yes Lorretta Harp, MD  losartan (COZAAR) 100 MG tablet Take 1 tablet by mouth daily 06/24/18  Yes Lorretta Harp, MD  Multiple Vitamin (MULTIVITAMIN) tablet Take 1 tablet by mouth daily.   Yes [provider]  omeprazole (PRILOSEC) 40 MG capsule Take 40 mg by mouth daily at 3 pm.   Yes [provider]  traZODone (DESYREL) 50 MG tablet Take 0.5-1 tablets (25-50 mg  total) by mouth at bedtime as needed for sleep. 09/03/18  Yes Susy Frizzle, MD  cyclobenzaprine (FLEXERIL) 10 MG tablet Take 1 tablet (10 mg total) by mouth 3 (three) times daily as needed for muscle spasms. Patient not taking: Reported on 12/24/2018 09/12/18   Susy Frizzle, MD     Allergies  Allergen Reactions  . Penicillins Anaphylaxis, Itching, Swelling and Rash    Has patient had a PCN reaction causing immediate rash, facial/tongue/throat  swelling, SOB or lightheadedness with hypotension: Yes Has patient had a PCN reaction causing severe rash involving mucus membranes or skin necrosis: No Has patient had a PCN reaction that required hospitalization: Yes Has patient had a PCN reaction occurring within the last 10 years: No If all of the above answers are "NO", then may proceed with Cephalosporin use.   . Cefaclor Itching, Swelling and Rash  . Latex Itching and Rash  . Metronidazole Itching, Swelling and Rash  . Naproxen Itching, Swelling and Rash  . Nsaids Itching, Swelling and Rash    Ibuprofen IS tolerated  . Septra [Bactrim] Itching, Swelling and Rash  . Sulfonamide Derivatives Itching, Swelling and Rash  . Tape Itching and Rash     Family History  Problem Relation Age of Onset  . Arthritis Mother   . Heart disease Unknown        5 brothers and 2 sister  . Diverticulitis Sister   . Aplastic anemia Other      Social History   Socioeconomic History  . Marital status: Married    Spouse name: Not on file  . Number of children: 1  . Years of education: Not on file  . Highest education level: Not on file  Occupational History  . Occupation: retired Clinical cytogeneticist: RETIRED  Social Needs  . Financial resource strain: Not on file  . Food insecurity    Worry: Not on file    Inability: Not on file  . Transportation needs    Medical: Not on file    Non-medical: Not on file  Tobacco Use  . Smoking status: Former Smoker    Packs/day: 0.50    Years: 40.00    Pack years: 20.00    Types: Cigarettes    Quit date: 02/13/2011    Years since quitting: 7.8  . Smokeless tobacco: Never Used  Substance and Sexual Activity  . Alcohol use: No  . Drug use: No  . Sexual activity: Not on file  Lifestyle  . Physical activity    Days per week: Not on file    Minutes per session: Not on file  . Stress: Not on file  Relationships  . Social Herbalist on phone: Not on file    Gets together:  Not on file    Attends religious service: Not on file    Active member of club or organization: Not on file    Attends meetings of clubs or organizations: Not on file    Relationship status: Not on file  . Intimate partner violence    Fear of current or ex partner: Not on file    Emotionally abused: Not on file    Physically abused: Not on file    Forced sexual activity: Not on file  Other Topics Concern  . Not on file  Social History Narrative  . Not on file     Review of Systems  Constitutional: Negative.  Negative for chills.  HENT: Negative.  Eyes: Negative.   Respiratory: Negative.   Cardiovascular: Negative.   Gastrointestinal: Negative.  Negative for nausea and vomiting.  Endocrine: Negative.   Genitourinary: Positive for dysuria, frequency and urgency. Negative for flank pain, hematuria and hesitancy.  Musculoskeletal: Negative.   Skin: Negative.   Allergic/Immunologic: Negative.   Neurological: Negative.   Hematological: Negative.   Psychiatric/Behavioral: Negative.   All other systems reviewed and are negative.      Objective:    Vitals:   12/24/18 0917  BP: 138/60  Pulse: (!) 52  Resp: 18  Temp: 98 F (36.7 C)  SpO2: 98%  Weight: 116 lb 12.8 oz (53 kg)  Height: 4\' 11"  (1.499 m)      Physical Exam Vitals signs and nursing note reviewed.  Constitutional:      General: She is not in acute distress.    Appearance: She is well-developed. She is not ill-appearing, toxic-appearing or diaphoretic.  HENT:     Head: Normocephalic and atraumatic.     Nose: Nose normal.     Mouth/Throat:     Mouth: Mucous membranes are moist.     Pharynx: Oropharynx is clear.  Eyes:     General:        Right eye: No discharge.        Left eye: No discharge.     Conjunctiva/sclera: Conjunctivae normal.  Neck:     Trachea: No tracheal deviation.  Cardiovascular:     Rate and Rhythm: Regular rhythm. Bradycardia present.     Pulses: Normal pulses.     Heart sounds:  Normal heart sounds. No murmur. No friction rub. No gallop.   Pulmonary:     Effort: Pulmonary effort is normal. No respiratory distress.     Breath sounds: Normal breath sounds. No stridor. No wheezing.  Abdominal:     General: Bowel sounds are normal. There is no distension.     Tenderness: There is abdominal tenderness (mild suprapubic ttp). There is no right CVA tenderness, left CVA tenderness, guarding or rebound.  Musculoskeletal: Normal range of motion.  Skin:    General: Skin is warm and dry.     Findings: No rash.  Neurological:     Mental Status: She is alert.     Motor: No abnormal muscle tone.     Coordination: Coordination normal.  Psychiatric:        Mood and Affect: Mood normal.        Behavior: Behavior normal.       Results: Results for orders placed or performed in visit on 12/24/18  Urinalysis, Routine w reflex microscopic  Result Value Ref Range   Color, Urine YELLOW YELLOW   APPearance CLOUDY (A) CLEAR   Specific Gravity, Urine 1.025 1.001 - 1.03   pH 5.0 5.0 - 8.0   Glucose, UA NEGATIVE NEGATIVE   Bilirubin Urine NEGATIVE NEGATIVE   Ketones, ur TRACE (A) NEGATIVE   Hgb urine dipstick 2+ (A) NEGATIVE   Protein, ur NEGATIVE NEGATIVE   Nitrite NEGATIVE NEGATIVE   Leukocytes,Ua 3+ (A) NEGATIVE   WBC, UA 40-60 (A) 0 - 5 /HPF   RBC / HPF 10-20 (A) 0 - 2 /HPF   Squamous Epithelial / LPF 0-5 < OR = 5 /HPF   Bacteria, UA MODERATE (A) NONE SEEN /HPF   Hyaline Cast NONE SEEN NONE SEEN /LPF  Microscopic Message  Result Value Ref Range   Note         Assessment & Plan:  ICD-10-CM   1. Acute cystitis with hematuria  N30.01 Urinalysis, Routine w reflex microscopic    Urine Culture   4d dysuria and hematuria, UA and microscopy positive, urine culture added, many abx allergies, cipro best choice with her options, discussed SE    Also advised to discuss concerning sx with husband, gait/balance difficulties, AMS, decreased appetite to follow up right  away, and we will call her and f/up with culture result.  (unable to use keflex, did not want to use fluoroquinolone, but seemed better option than macrobid for her age)    Delsa Grana, Hershal Coria 12/24/18 9:26 AM

## 2019-02-12 DIAGNOSIS — H524 Presbyopia: Secondary | ICD-10-CM | POA: Diagnosis not present

## 2019-02-12 DIAGNOSIS — H52223 Regular astigmatism, bilateral: Secondary | ICD-10-CM | POA: Diagnosis not present

## 2019-02-12 DIAGNOSIS — H5202 Hypermetropia, left eye: Secondary | ICD-10-CM | POA: Diagnosis not present

## 2019-02-12 DIAGNOSIS — H04123 Dry eye syndrome of bilateral lacrimal glands: Secondary | ICD-10-CM | POA: Diagnosis not present

## 2019-02-12 DIAGNOSIS — H25813 Combined forms of age-related cataract, bilateral: Secondary | ICD-10-CM | POA: Diagnosis not present

## 2019-02-19 ENCOUNTER — Ambulatory Visit (HOSPITAL_COMMUNITY)
Admission: RE | Admit: 2019-02-19 | Discharge: 2019-02-19 | Disposition: A | Discharge status: Home / Self Care | Payer: Medicare Other | Source: Ambulatory Visit | Attending: Cardiology | Admitting: Cardiology

## 2019-02-19 ENCOUNTER — Other Ambulatory Visit: Payer: Self-pay

## 2019-02-19 ENCOUNTER — Other Ambulatory Visit (HOSPITAL_COMMUNITY): Payer: Self-pay | Admitting: Cardiovascular Disease

## 2019-02-19 DIAGNOSIS — I6523 Occlusion and stenosis of bilateral carotid arteries: Secondary | ICD-10-CM | POA: Diagnosis not present

## 2019-02-20 ENCOUNTER — Telehealth: Payer: Self-pay | Admitting: Cardiovascular Disease

## 2019-02-20 NOTE — Telephone Encounter (Signed)
Reviewed 9/10 carotid results with pt. She verbalized understanding. Repeat order in Epic

## 2019-02-20 NOTE — Telephone Encounter (Signed)
New message   Patient states that she is returning a call. Please call.

## 2019-03-10 ENCOUNTER — Ambulatory Visit (INDEPENDENT_AMBULATORY_CARE_PROVIDER_SITE_OTHER): Payer: Medicare Other

## 2019-03-10 ENCOUNTER — Other Ambulatory Visit: Payer: Self-pay

## 2019-03-10 DIAGNOSIS — Z23 Encounter for immunization: Secondary | ICD-10-CM | POA: Diagnosis not present

## 2019-03-27 ENCOUNTER — Encounter: Payer: Self-pay | Admitting: Family Medicine

## 2019-03-27 ENCOUNTER — Other Ambulatory Visit: Payer: Self-pay

## 2019-03-27 ENCOUNTER — Ambulatory Visit (INDEPENDENT_AMBULATORY_CARE_PROVIDER_SITE_OTHER): Payer: Medicare Other | Admitting: Family Medicine

## 2019-03-27 VITALS — BP 120/72 | HR 51 | Temp 97.6°F | Ht 61.0 in | Wt 115.0 lb

## 2019-03-27 DIAGNOSIS — R5383 Other fatigue: Secondary | ICD-10-CM | POA: Diagnosis not present

## 2019-03-27 DIAGNOSIS — I6523 Occlusion and stenosis of bilateral carotid arteries: Secondary | ICD-10-CM

## 2019-03-27 DIAGNOSIS — F32A Depression, unspecified: Secondary | ICD-10-CM

## 2019-03-27 DIAGNOSIS — D510 Vitamin B12 deficiency anemia due to intrinsic factor deficiency: Secondary | ICD-10-CM | POA: Diagnosis not present

## 2019-03-27 DIAGNOSIS — F329 Major depressive disorder, single episode, unspecified: Secondary | ICD-10-CM

## 2019-03-27 MED ORDER — METHYLPHENIDATE HCL 10 MG PO TABS: 10.0000 mg | ORAL_TABLET | Freq: Every morning | ORAL | 0 refills | Status: DC

## 2019-03-27 MED ORDER — MUPIROCIN 2 % EX OINT: 1.0000 "application " | TOPICAL_OINTMENT | Freq: Two times a day (BID) | CUTANEOUS | 0 refills | Status: DC

## 2019-03-27 NOTE — Progress Notes (Signed)
ccd

## 2019-03-27 NOTE — Progress Notes (Signed)
Subjective:    Patient ID: Brittany Kirk, female    DOB: [birth year], 73 y.o.   MRN: [id number]  Hypertension   Patient was recently admitted to the hospital.  I have copied relevant portions of the discharge summary and included them below for my reference:  Admit Date: 10/11/2017 Discharge Date: 10/12/2017  Primary Care Provider: Susy Frizzle, MD Primary Cardiologist: Dr. Gwenlyn Found, MD  Discharge Diagnoses    Active Problems:   Unstable angina [facility])  History of Present Illness     73 year old female with history of CAD medically managed as detailed below, emphysema, CKD stage III, HTN, HLD, breast cancer s/p lumpectomy, carotid artery disease, prior tobacco abuse for 25 pack years quitting ~ 3 years prior, strong family history of heart disease with multiple siblings who passed away at a premature age from MIs, and GERD who was admitted to [facility] with chest pain on 10/11/2017.   Patient has history of a remote cardiac cath by Dr. Lia Foyer, MD, in 09/2008, that showed moderate calcification in the coronary arteries, high-grade ostial ramus intermedius at trifurcation location involving LAD, ramus and the AV circumflex. Medical therapy was pursued as lesion was not ideal for percutaneous intervention given his ostial location andit's relationship to the LAD and the left circumflex interface. Most recent ischemic evaluation by [facility] on 01/05/2017 that was negative for ischemia, no EKG changes, EF 83%, low risk scan. Over the fall and winter months of 2018, she had an upper and lower endoscopy that were both unrevealing. She was seen in the office on 5/3 for evaluation of chest pain. At that time, she noted increasing SOB, substernal chest pain that radiated to her back and left upper extremity. Her EKG showed no acute changes. She was transported via EMS to [facility].   Hospital Course     Consultants: none  Upon her arrival to [facility], troponin was cycled and she ruled out.  CXR showed a mild opacity of the right base. She underwent LHC on 10/11/2017 that showed single vessel CAD involving the ostium of the ramus intermediate branch. Cardiac cath was unchanged from prior study in 2010. Given prior concern regarding shifting plaque into the LAD or LCx with PCI. Patient had not been on any antianginal therapy and was noted to be severely hypertensive in the cath lab. Aggressive medical therapy and BP control was advised. She was started on Coreg and Imdur. If she continues to have refractory angina despite optimal medical therapy and BP control, PCI of the ramus could be considered. Post cath labs showed stable renal function. In the evening of 5/3, the patient was hypotensive with systolic BP in the 49F to 80s. There were no complications from her cardiac cath site. PM Coreg was held. She was given IV saline bolus with improvement in BP. Initial BP this morning was soft in the 02O systolic. Coreg was held again. BP 125 mmHg currently. Patient has explained to Dr. Rayann Heman that she only wants to take her prior to admission medications upon discharge and she will not be sent home on any new medications. She has been advised to follow up with Dr. Gwenlyn Found for further medical management (message has been sent to our office).   The patient's right femoral cardiac cath site has been examined and is healing well without issues at this time. The patient has been seen by Dr. Rayann Heman, MD and felt to be stable for discharge today. All follow up appointments have been  made. Discharge medications are listed below. Prescriptions have been reviewed with the patient and sent in to their pharmacy.  _____________  10/28/17 Patient is here today complaining of pain going up and down her back, tremor which is constant, and feeling tired all the time.  I reviewed her past records and she is also had a CT scan of the chest abdomen and pelvis in March of this year which was reassuring.  Those records are copied  below: IMPRESSION: No findings specific for recurrent or metastatic disease.  Status post right axillary lymph node dissection.  Faint subpleural micronodularity measuring up to 3 mm, predominantly in the bilateral upper lobes. Consider follow-up CT chest in 6 months to confirm stability. However, this appearance is not worrisome for metastatic disease.  1.8 cm left adrenal nodule, only minimally increased from 2014, suggesting a benign adrenal adenoma.  Can now is clear  Last year, I had seen the patient for persistent nausea and weight loss.  Work-up at that time revealed no specific etiology.  My working diagnosis was most likely anxiety and stress.  Today the patient states that she has had burning stinging pain radiating from her neck to her tailbone and from her tailbone up to her neck now for several months.  She also reports worsening weakness in her arms and legs.  She reports worsening essential tremor.  This now involves her head and neck as well as both arms.  It worsens with activity and is present with rest.  She reports continued weight loss.  She reports feeling extremely tired and weak.  Patient saw neurology last year and had an MRI of the brain as well as cervical spine that was essentially normal.  Patient states that if I sent her to a neurologist she wants to second opinion.  At that time, my plan was: I spent approximately 30 minutes with the patient today conducting her history and her exam.  I have also spent a great deal of time reviewing her past medical records over the last several months including her referral to neurology, her follow-up with her oncologist, her recent admission to cardiology.  Given her unusual radicular almost neuropathic pain, I reviewed her records and found that she recently had a TSH in May of this year that was normal.  She had a vitamin B12 level checked in October 2018 that was normal.  She had an MRI in February 2018 of her neck that was  normal.  She has had a CT scan of the chest abdomen and pelvis that was normal or at least showed no specific cause of her symptoms.  As I explained to the patient, she has had numerous somatic complaints over the last 12 to 18 months with no specific finding on thorough diagnostic work-up.  I believe this could be related to anxiety, stress, and underlying depression.  Patient disagrees with this assessment.  She states that emotionally she feels fine.  Therefore I will proceed with an MRI of the cervical and lumbar spine to evaluate for any abnormalities that would explain her radicular pain and neuropathic pain that radiates from her neck to her tailbone and from her tailbone to her neck that may also cause her subjective weakness in her arms and legs.  Differential diagnosis includes multiple sclerosis.  Her essential tremor is certainly worsening however I will defer that at the present time until I have the results of her MRI.  If MRI shows no specific cause for symptoms, I will  revisit possible somatization disorder.    11/05/17 Patient called 5/21 and asked me to not order the MRI.  However the patient has an appointment already scheduled for Saturday.  She now states that she would like to keep that appointment.  She is here today continuing to complain of a burning heat warm sensation radiating from her neck to her tailbone and from her tailbone to her neck.  The pain, for lack of a better word, comes and goes with no specific cause and no specific alleviating factor.  She also continues to report nausea.  Nothing has helped her nausea.. Wt Readings from Last 3 Encounters:  03/27/19 115 lb (52.2 kg)  12/24/18 116 lb 12.8 oz (53 kg)  09/12/18 114 lb (51.7 kg)   Thankfully her weight has remained relatively stable over the last few weeks as evidenced above.  She denies any vomiting.  She has had an exhaustive GI work-up last year with no specific cause for her nausea.  As I explained to the patient  and her last visit, I believe some the symptoms could be conversion disorder essentially.  I wonder if stress and underlying depression may be causing a myriad of somatic complaints.  She does report depression.  She reports poor energy.  She reports fatigue.  She reports poor appetite.  She denies trouble sleeping.  She denies suicidal ideation.  She does report loss of interest/anhedonia.  She independently discontinued Lexapro due to perceived lack of benefit.   At that time, my plan was: I am not certain of the diagnosis but I am concerned that some of this may be conversion disorder and that some of her physical symptoms could be due to to underlying depression/stress/etc.  I am awaiting the results of the MRI but I have recommended starting the patient on Cymbalta 60 mg a day and rechecking in 4 weeks to see if her symptoms will improve.  I also believe there may be added benefit given some of the effect Cymbalta has been treating neuropathic pain.  Await the results of the MRI as previously scheduled.  12/09/17 Patient discontinued Lexapro and began Cymbalta 60 mg a day.  Patient states that the nausea has improved.  Her resting tremor has improved.  She still has very little appetite and has lost an additional 2 pounds since her last appointment.  She no longer has the neuropathic pain radiating from her head to her tailbone and from her tailbone to her head.  MRI of the cervical spine and lumbar spine revealed only mild spondylosis but no explanation for her symptoms.  Therefore I still believe we are dealing with conversion disorder secondary to uncontrolled depression and anxiety.  I explained this to the patient in length today.  She states that she feels that her depression has not improved since switching to Cymbalta.  She actually feels more depressed in the afternoons than previously.  She denies any panic attacks.  She denies any suicidal ideation.  At that time, my plan was: I am heartened by  the fact the nausea has improved, the pain has improved, and the tremor has improved.  I feel we are on the right track.  Of asked the patient to allow the Cymbalta 2-4 more weeks to take full effect.  At that point if her depression is not any better, I would consider switching the patient to Prozac as an appetite stimulant coupled with Wellbutrin in place of the Cymbalta.  However I would still focus on  treating underlying anxiety and depression as a cause of her physical symptoms as stated earlier.  01/06/18 I am very happy to report that the patient feels much better.  Her depression is improving.  Furthermore her anxiety is improving.  She is taking less and less diazepam.  She has been attending a class at her church regarding depression and anxiety and she feels that this is been extremely beneficial to her being able to talk to other people who have experience what she is experiencing and know how she feels.  The anxiety is improving.  She is sleeping better.  She continues to deny any suicidal ideation.  She states that she feels much happier.  She is still battling depression but it has improved substantially.  She reports less anhedonia.  She reports more energy and better concentration.  At that time, my plan was: I am so happy that the patient is benefiting from treatment.  Continue Cymbalta 60 mg a day.  We did discuss starting a low-dose Klonopin 0.5 mg p.o. twice daily on a scheduled basis to try to help calm anxiety.  Patient is actually feeling better and wants to avoid additional medication and continue on her current treatment regimen for the present time.  I believe this is completely appropriate.  Recheck the patient in 3 to 6 months or as needed.  02/17/18 Saw Cardiology on 02/14/18.  BP was 190/80.  Reviewed their note.  Losartan 50 mg poqday was resumed.  She walked in this morning complaining of dizziness and headache asking for her BP to be checked.  Initially 190/82.  She was then  placed on my schedule.  Patient denies any chest pain.  She denies any shortness of breath.  She denies any oliguria or hematuria.  She denies any vision changes.  The headache is mild and seems to be worse when her blood pressure is high.  Over the weekend, her blood pressure has fluctuated quite a bit.  The lowest she has seen her blood pressure is 140/86.  The highest she is seen his blood pressure is 190/80.  I personally repeated her blood pressure and found it to be 190/82 as well.  She is compliant with the losartan.  On her exam today there is no papilledema.  There is no evidence of endorgan damage.  EKG shows normal sinus rhythm with no ischemia no ST segment elevation, no T wave inversions or ST segment depression, no sign of left ventricular strain.  At that time, my plan was: There is no evidence of endorgan damage.  Therefore I believe the patient is hypertensive urgency.  I have asked her to continue losartan 50 mg a day and supplement with hydrochlorothiazide 25 mg a day.  I will check a CBC as well as a CMP to evaluate for renal dysfunction.  Recheck the patient on Friday.  At that time hopefully we will see her blood pressure in the 539J systolic.  If still elevated in the 190s, I would recommend switching to clonidine and evaluating for renal artery stenosis.  Patient is to go to the emergency room if she develops any symptoms of end organ damage including severe headache, vision changes, chest pain, shortness of breath, oliguria, hematuria.  She agrees.  02/28/18 Patient saw my partner last Friday, September 13.  After the addition of hydrochlorothiazide, her blood pressure began to drop precipitously.  Therefore my partner reduce her hydrochlorothiazide to 12.5 mg and continued her on losartan 50 mg a day.  She presents today with 1 week of additional blood pressures.  Her morning blood pressures are consistently high between 443 and 154 systolic.  Diastolic blood pressures always well  controlled.  She is taking hydrochlorothiazide and losartan at morning around 9:00.  Her afternoon blood pressures are well controlled and at times even low averaging between 008 and 676 systolic.  She is checking her blood pressures 5 and 6 times a day.  I reviewed the results of her carotid Doppler ordered by her cardiologist.  There was bilateral internal carotid artery stenosis of 40 to 59%.  There also coincidental findings of irregular complex nodules and a thyroid ultrasound was recommended.  Patient now wants to discontinue Cymbalta.  She states that she thinks the medication is not helping anymore the Lexapro did and she does not want to stay on the medication.  At that time, my plan was: First I need to work-up the thyroid nodules.  I will obtain a TSH, free T3, and free T4 to determine if they are hot or cold nodules.  I would also obtain a thyroid ultrasound to determine if these nodules require fine-needle biopsy.  Second I am concerned about her labile blood pressure.  If thyroid test is normal, I would consider working up for possible pheochromocytoma and renal artery stenosis.  I will begin by repeating a BMP after the addition of losartan to look for any evidence of renal insufficiency which would be consistent with renal artery stenosis.  I have recommended that she take losartan 50 mg p.o. twice daily to spread the medication out over a 24-hour period more consistently and continue hydrochlorothiazide 12.5 mg in the morning.  I hope that this will help bring down her blood pressures in the morning but avoid hypotension in the evening.  Recheck blood pressure in 1 week.  If blood pressures remain labile, I would proceed with a 24-hour urine collection to evaluate for pheochromocytoma particular given the previous history of a "benign adrenal adenoma" seen on previous CT.  I have also recommended that we not change Cymbalta at the present time to avoid possible confounding symptoms due to  withdrawal of medication or worsening anxiety.  Once we have the blood pressure adequately controlled and the thyroid nodules worked up, we can certainly revisit discontinuation of Cymbalta.  I do not believe the Cymbalta is causing the hypertension.  There is only occasional report of 2% of patients experiencing elevated blood pressure on Cymbalta and I feel that this is unlikely for this patient  07/14/18  Thyroid US: IMPRESSION: 1. Findings suggestive of multinodular goiter. 2. None of the discretely measured thyroid nodules meet imaging criteria to recommend percutaneous sampling or continued dedicated follow-up.  Since I last saw the patient, she saw her cardiologist Dr. Gwenlyn Found and is currently seeing the Pharm.D. at the cardiology clinic to better manage her blood pressure.  They have increased her losartan 100 mg a day.  She continues to have morning blood pressures in the 60-1 70 range with the evening blood pressures with a systolic blood pressure of 100 despite taking her blood pressure medicine in the evening.  She reports difficulty sleeping.  She states that she will only sleep 1 or 2 hours and then she awakens and is unable to go back to sleep.  She also reports a dry mouth.  She is not using Valium.  She wants to wean off Cymbalta.  She does not find that the Cymbalta is helping her anxiety or her  depression.  She like to see how she would do off the medication.  She denies any chest pain shortness of breath or dyspnea on exertion.  At that time, my plan was: Blood pressure today is still elevated.  I will defer management to her cardiologist that she has an appointment already on Wednesday to see them and discuss this to avoid polypharmacy.  I will wean the patient off Cymbalta and decrease to 30 mg a day for 2 weeks then 30 mg every other day for 2 weeks then discontinue the medication.  If the depression worsens, the patient would like to go back on Lexapro because she feels that that  medication worked better.  We will add trazodone 25 to 50 mg p.o. nightly for insomnia and then reassess the patient in 1 month.  08/11/18 Patient saw cardiology who put her on hydralazine 25 mg 3 times a day for her blood pressure.  Her blood pressure is elevated today at 188/70.  I rechecked it and found it to be 170/64.  However she presents today with several concerns.  Her primary concern is swelling in her ankles and feet.  I removed the patient walks in shoes.  There is no significant swelling today in her ankles or in her feet.  There is no pitting edema.  Patient states that it waxes and wanes.  However there is no appreciable edema on exam today.  What is concerning is that distal to the MTP joints on both feet, the toes are purple.  They also have sluggish capillary refill less than 5 seconds.  Findings are uniform and symmetric bilaterally concerning for Raynaud's phenomenon versus arterial insufficiency to the toes.  Patient denies any pain in the feet but she does state that her legs feel cold all the time.  She also states that her tremor is worsening.  She states that she is more shaky than normal.  She has bilateral essential tremor in both hands.  She also has an essential tremor in her neck and head.  She has never tried any kind of medication for essential tremor.  She has never tried propranolol or primidone.  She also reports feeling anxious inside.  She denies that the anxiety worsened after she decrease the Cymbalta.  She is weaning off the Cymbalta but she states that Cymbalta never helped with anxiety.  The anxiety waxes and wanes.  She did feel that the Lexapro was more beneficial than the Cymbalta but still not sufficient.  At that time, my plan was: Generalized anxiety disorder with depression.  Discontinue Cymbalta and replace with Prozac 20 mg a day and reassess the patient in 3 weeks.  Essential tremor.  Once the patient is tolerating Prozac I would add propranolol 20 mg 3 times a  day as needed for tremor and elevated blood pressure.  If the propranolol helps, we could then switch to a long-acting dose depending on how much propranolol the patient is taking per day.  Raynaud's phenomenon.  Schedule arterial Dopplers with ABIs and TBI's of the lower extremities to evaluate for any evidence of peripheral artery disease.  If none is present, amlodipine could be used to help treat this however this would exacerbate the swelling with the patient is reporting.  Therefore if there is no significant peripheral artery disease, I would recommend against treating the Raynaud's phenomenon with medication unless it becomes problematic.  Recheck the patient in 3 weeks  03/27/19 Patient again presents complaining of severe fatigue.  She  states that she has very little energy.  She states that even after sleeping 8 hours every evening, she wakes up and still feels extremely tired like she could fall asleep.  Is very hard for her to get out of bed.  She denies any suicidal ideation.  She denies any hallucinations or delusions however she does report worsening depression.  The depression seems to have only been compounded by the COVID-19 pandemic and the mandatory quarantine.  She denies any chest pain or shortness of breath or dyspnea on exertion.  She denies any nausea or vomiting or diarrhea or abdominal pain.  She denies any snoring or witnessed apneic episodes.  She does report hypersomnolence but she does not feel that she has sleep apnea or narcolepsy.  She just feels tired and fatigued all the time.  Extensive work-up last year found no physical explanation Past Medical History:  Diagnosis Date   Allergic rhinitis    Anxiety    Asymptomatic bilateral carotid artery stenosis    40-59% (02/2018)   Breast cancer ([facility]) 2015   Right Breast Cancer   CAD (coronary artery disease)    Cancer ([facility]CC)    Depression    Emphysema    no longer uses inhalers   Gallstones    nausea, pain  upper right abdomen   GERD (gastroesophageal reflux disease)    H/O hiatal hernia    History of skin cancer    Hyperlipidemia    Hypertension    Multinodular goiter (nontoxic)    Tobacco user    Wears dentures    top   Past Surgical History:  Procedure Laterality Date   BREAST LUMPECTOMY Right 2015   CHOLECYSTECTOMY  07/08/2012   Procedure: LAPAROSCOPIC CHOLECYSTECTOMY WITH INTRAOPERATIVE CHOLANGIOGRAM;  Surgeon: Gayland Curry, MD,FACS;  Location: [facility];  Service: General;  Laterality: N/A;  Laparoscopic Cholecystectomy with Intraoperative Cholangiogram   COLONOSCOPY     ESOPHAGOSCOPY N/A 05/29/2017   Procedure: ESOPHAGOSCOPY;  Surgeon: Clarene Essex, MD;  Location: [place];  Service: Endoscopy;  Laterality: N/A;  botox injection   HAND SURGERY Left    wrist   LEFT HEART CATH AND CORONARY ANGIOGRAPHY N/A 10/11/2017   Procedure: LEFT HEART CATH AND CORONARY ANGIOGRAPHY;  Surgeon: Martinique, Peter M, MD;  Location: [facility];  Service: Cardiovascular;  Laterality: N/A;   TONSILECTOMY, ADENOIDECTOMY, BILATERAL MYRINGOTOMY AND TUBES     TUBAL LIGATION     Current Outpatient Medications on File Prior to Visit  Medication Sig Dispense Refill   anastrozole (ARIMIDEX) 1 MG tablet Take 1 tablet (1 mg total) by mouth daily. 90 tablet 3   aspirin 81 MG tablet Take 81 mg by mouth daily.     atorvastatin (LIPITOR) 40 MG tablet TAKE 1 TABLET BY MOUTH EVERYDAY AT BEDTIME 90 tablet 1   Cyanocobalamin (B-12) 2500 MCG TABS Take 2,500 mcg by mouth daily.     diazepam (VALIUM) 5 MG tablet Take 5 mg by mouth as needed.   3   FLUoxetine (PROZAC) 20 MG capsule TAKE 1 CAPSULE BY MOUTH EVERY DAY 90 capsule 2   Multiple Vitamin (MULTIVITAMIN) tablet Take 1 tablet by mouth daily.     cyclobenzaprine (FLEXERIL) 10 MG tablet Take 1 tablet (10 mg total) by mouth 3 (three) times daily as needed for muscle spasms. (Patient not taking: Reported on 12/24/2018) 30 tablet 0    hydrALAZINE (APRESOLINE) 25 MG tablet Take 1 tablet by mouth once a day in the morning. (Patient not taking:  Reported on 03/27/2019) 30 tablet 11   losartan (COZAAR) 100 MG tablet Take 1 tablet by mouth daily     omeprazole (PRILOSEC) 40 MG capsule Take 40 mg by mouth daily at 3 pm.     traZODone (DESYREL) 50 MG tablet Take 0.5-1 tablets (25-50 mg total) by mouth at bedtime as needed for sleep. (Patient not taking: Reported on 03/27/2019) 90 tablet 3   No current facility-administered medications on file prior to visit.    Allergies  Allergen Reactions   Penicillins Anaphylaxis, Itching, Swelling and Rash    Has patient had a PCN reaction causing immediate rash, facial/tongue/throat swelling, SOB or lightheadedness with hypotension: Yes Has patient had a PCN reaction causing severe rash involving mucus membranes or skin necrosis: No Has patient had a PCN reaction that required hospitalization: Yes Has patient had a PCN reaction occurring within the last 10 years: No If all of the above answers are "NO", then may proceed with Cephalosporin use.    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[employer] resource strain: Not on file   Food insecurity    Worry: Not on file    Inability: Not on file   Transportation needs    Medical: Not on file    Non-medical: Not on file  Tobacco Use    Smoking status: Former Smoker    Packs/day: 0.50    Years: 40.00    Pack years: 20.00    Types: Cigarettes    Quit date: 02/13/2011    Years since quitting: 8.1   Smokeless tobacco: Never Used  Substance and Sexual Activity   Alcohol use: No   Drug use: No   Sexual activity: Not on file  Lifestyle   Physical activity    Days per week: Not on file    Minutes per session: Not on file   Stress: Not on file  Relationships   Social connections    Talks on phone: Not on file    Gets together: Not on file    Attends religious service: Not on file    Active member of club or organization: Not on file    Attends meetings of clubs or organizations: Not on file    Relationship status: Not on file   Intimate partner violence    Fear of current or ex partner: Not on file    Emotionally abused: Not on file    Physically abused: Not on file    Forced sexual activity: Not on file  Other Topics Concern   Not on file  Social History Narrative   Not on file     Review of Systems  All other systems reviewed and are negative.      Objective:   Physical Exam Vitals signs reviewed.  Constitutional:      General: She is  not in acute distress.    Appearance: She is well-developed. She is not diaphoretic.  HENT:     Head: Normocephalic and atraumatic.     Right Ear: External ear normal.     Left Ear: External ear normal.     Mouth/Throat:     Pharynx: No oropharyngeal exudate.  Eyes:     Conjunctiva/sclera: Conjunctivae normal.     Pupils: Pupils are equal, round, and reactive to light.  Neck:     Musculoskeletal: Normal range of motion.     Thyroid: No thyromegaly.     Vascular: No JVD.  Cardiovascular:     Rate and Rhythm: Normal rate and regular rhythm.     Heart sounds: Normal heart sounds. No murmur. No friction rub. No gallop.   Pulmonary:     Effort: Pulmonary effort is normal. No respiratory distress.     Breath sounds: Normal breath sounds. No stridor. No  wheezing or rales.  Abdominal:     General: Bowel sounds are normal. There is no distension.     Palpations: Abdomen is soft.     Tenderness: There is no abdominal tenderness. There is no guarding.  Lymphadenopathy:     Cervical: No cervical adenopathy.  Neurological:     Mental Status: She is alert and oriented to person, place, and time.     Cranial Nerves: No cranial nerve deficit.     Sensory: No sensory deficit.     Motor: Tremor present. No atrophy, abnormal muscle tone or seizure activity.     Deep Tendon Reflexes: Reflexes are normal and symmetric.          Assessment & Plan:  Fatigue due to depression - Plan: methylphenidate (RITALIN) 10 MG tablet, CBC with Differential/Platelet, Vitamin B12, COMPLETE METABOLIC PANEL WITH GFR, TSH  I had a long discussion today with the patient.  I believe this is most likely fatigue due to depression.  We discussed a referral to neurology for split level sleep study to evaluate for sleep apnea.  However I believe the patient most likely has chronic fatigue syndrome or more Apley depression and fatigue related to this.  We discussed the risk and benefits and the patient is willing to try methylphenidate 10 mg p.o. every morning.  She will monitor for worsening anxiety, worsening tremor, tachycardia, or chest pain.  We will need to monitor her blood pressure closely.  Reassess the patient in 2 weeks or sooner if symptoms worsen.

## 2019-03-28 LAB — CBC WITH DIFFERENTIAL/PLATELET
Absolute Monocytes: 464 cells/uL (ref 200–950)
Basophils Absolute: 30 cells/uL (ref 0–200)
Basophils Relative: 0.4 %
Eosinophils Absolute: 30 cells/uL (ref 15–500)
Eosinophils Relative: 0.4 %
HCT: 39.4 % (ref 35.0–45.0)
Hemoglobin: 13.1 g/dL (ref 11.7–15.5)
Lymphs Abs: 2333 cells/uL (ref 850–3900)
MCH: 32.9 pg (ref 27.0–33.0)
MCHC: 33.2 g/dL (ref 32.0–36.0)
MCV: 99 fL (ref 80.0–100.0)
MPV: 10.3 fL (ref 7.5–12.5)
Monocytes Relative: 6.1 %
Neutro Abs: 4742 cells/uL (ref 1500–7800)
Neutrophils Relative %: 62.4 %
Platelets: 243 10*3/uL (ref 140–400)
RBC: 3.98 10*6/uL (ref 3.80–5.10)
RDW: 12.8 % (ref 11.0–15.0)
Total Lymphocyte: 30.7 %
WBC: 7.6 10*3/uL (ref 3.8–10.8)

## 2019-03-28 LAB — COMPLETE METABOLIC PANEL WITH GFR
AG Ratio: 1.8 (calc) (ref 1.0–2.5)
ALT: 11 U/L (ref 6–29)
AST: 17 U/L (ref 10–35)
Albumin: 3.8 g/dL (ref 3.6–5.1)
Alkaline phosphatase (APISO): 56 U/L (ref 37–153)
BUN: 20 mg/dL (ref 7–25)
CO2: 25 mmol/L (ref 20–32)
Calcium: 9.1 mg/dL (ref 8.6–10.4)
Chloride: 105 mmol/L (ref 98–110)
Creat: 0.8 mg/dL (ref 0.60–0.93)
GFR, Est African American: 84 mL/min/{1.73_m2} (ref >=60)
GFR, Est Non African American: 72 mL/min/{1.73_m2} (ref >=60)
Globulin: 2.1 g/dL (calc) (ref 1.9–3.7)
Glucose, Bld: 98 mg/dL (ref 65–99)
Potassium: 4.6 mmol/L (ref 3.5–5.3)
Sodium: 140 mmol/L (ref 135–146)
Total Bilirubin: 0.5 mg/dL (ref 0.2–1.2)
Total Protein: 5.9 g/dL — ABNORMAL LOW (ref 6.1–8.1)

## 2019-03-28 LAB — TSH: TSH: 0.54 mIU/L (ref 0.40–4.50)

## 2019-03-28 LAB — VITAMIN B12: Vitamin B-12: 1040 pg/mL (ref 200–1100)

## 2019-03-31 ENCOUNTER — Telehealth: Payer: Self-pay

## 2019-03-31 NOTE — Telephone Encounter (Signed)
Spoke with pt who is okay with concerting her appt type for 10/21 from OV to virtual. Confirmed 10/21 at 10:30am with pt who verbalized understanding

## 2019-04-01 ENCOUNTER — Telehealth: Payer: Self-pay

## 2019-04-01 ENCOUNTER — Telehealth (INDEPENDENT_AMBULATORY_CARE_PROVIDER_SITE_OTHER): Payer: Medicare Other | Admitting: Cardiovascular Disease

## 2019-04-01 ENCOUNTER — Encounter: Payer: Self-pay | Admitting: Cardiovascular Disease

## 2019-04-01 DIAGNOSIS — I10 Essential (primary) hypertension: Secondary | ICD-10-CM

## 2019-04-01 DIAGNOSIS — I2510 Atherosclerotic heart disease of native coronary artery without angina pectoris: Secondary | ICD-10-CM

## 2019-04-01 DIAGNOSIS — F17200 Nicotine dependence, unspecified, uncomplicated: Secondary | ICD-10-CM

## 2019-04-01 DIAGNOSIS — E785 Hyperlipidemia, unspecified: Secondary | ICD-10-CM

## 2019-04-01 DIAGNOSIS — I6523 Occlusion and stenosis of bilateral carotid arteries: Secondary | ICD-10-CM

## 2019-04-01 NOTE — Progress Notes (Signed)
Virtual Visit via Telephone Note   This visit type was conducted due to national recommendations for restrictions regarding the COVID-19 Pandemic (e.g. social distancing) in an effort to limit this patient's exposure and mitigate transmission in our community.  Due to her co-morbid illnesses, this patient is at least at moderate risk for complications without adequate follow up.  This format is felt to be most appropriate for this patient at this time.  The patient did not have access to video technology/had technical difficulties with video requiring transitioning to audio format only (telephone).  All issues noted in this document were discussed and addressed.  No physical exam could be performed with this format.  Please refer to the patient's chart for her  consent to telehealth for [facility].   Date:  04/01/2019   ID:  Brittany Kirk, DOB 07-04-1945, MRN [id number]  Patient Location: Home Provider Location: Home  PCP:  Susy Frizzle, MD  Cardiologist: Dr. [name] Electrophysiologist:  None   Evaluation Performed:  Follow-Up Visit  Chief Complaint: Follow-up essential hypertension and hyperlipidemia as well as CAD  History of Present Illness:    Brittany Kirk is a 73 y.o.   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 She was referred by Dr. Dennard Schaumann for cardiovascular evaluation because of chest pain. I last saw her in the office  04/01/2018.Risk factors include 25 pack years of tobacco abuse having quit 3 years ago. She has treated hypertension as well as a strong family history of heart disease with multiple siblings who died at a premature age of myocardial infarction's. She has never had a heart attack or stroke. She has had a remote cardiac catheterization by Dr. Lia Foyer but no intervention was performed. She was complaining of some substernal chest pain  with chronic nausea. This has since some sided after beginning on a proton pump inhibitor.She had a negative stress test 01/24/16 and again during a recent consultation for chest pain 01/05/17.She has had upper and lower endoscopy for abdominal pain and nausea which was unrevealing. In the office she complains of increasing shortness of breath, substernal chest pain rating to her back and left upper extremity. Her EKG shows no acute changes. She clearlyappeareduncomfortable .I had her urgently transported to [facility] by EMS for cardiac catheterization which was performed by Dr. Martinique showing moderately severe ramus branch ostial disease with noncritical disease otherwise a normal LV function. This is unchanged from prior cath performed in 2010 it was thought that her chest pain was noncardiac in nature.  Since I saw her in the office 1 year ago she continues to do well.  She denies chest pain.  Her carotid Dopplers recently performed 02/19/2019 showed moderately severe right internal carotid artery stenosis and moderate left.  Her blood pressure this morning was 121/58 and she stopped her blood pressure medicines about a month ago because of symptomatic hypotension.  Her major complaint is of fatigue.  She says she has "no energy".  Her PCP suggested that she start on Ritalin.  She did not fill this prescription.   The patient does not have symptoms concerning for COVID-19 infection (fever, chills, cough, or new shortness of breath).    Past Medical History:  Diagnosis Date  . Allergic rhinitis   . Anxiety   . Asymptomatic bilateral carotid artery stenosis    40-59% (02/2018)  . Breast cancer ([place]) 2015  Right Breast Cancer  . CAD (coronary artery disease)   . Cancer ([place])   . Depression   . Emphysema    no longer uses inhalers  . Gallstones    nausea, pain upper right abdomen  . GERD (gastroesophageal reflux disease)   . H/O hiatal hernia   . History of skin cancer   .  Hyperlipidemia   . Hypertension   . Multinodular goiter (nontoxic)   . Tobacco user   . Wears dentures    top   Past Surgical History:  Procedure Laterality Date  . BREAST LUMPECTOMY Right 2015  . CHOLECYSTECTOMY  07/08/2012   Procedure: LAPAROSCOPIC CHOLECYSTECTOMY WITH INTRAOPERATIVE CHOLANGIOGRAM;  Surgeon: Gayland Curry, MD,FACS;  Location: [facility];  Service: General;  Laterality: N/A;  Laparoscopic Cholecystectomy with Intraoperative Cholangiogram  . COLONOSCOPY    . ESOPHAGOSCOPY N/A 05/29/2017   Procedure: ESOPHAGOSCOPY;  Surgeon: Clarene Essex, MD;  Location: [facility];  Service: Endoscopy;  Laterality: N/A;  botox injection  . HAND SURGERY Left    wrist  . LEFT HEART CATH AND CORONARY ANGIOGRAPHY N/A 10/11/2017   Procedure: LEFT HEART CATH AND CORONARY ANGIOGRAPHY;  Surgeon: Martinique, Peter M, MD;  Location: [facility];  Service: Cardiovascular;  Laterality: N/A;  . TONSILECTOMY, ADENOIDECTOMY, BILATERAL MYRINGOTOMY AND TUBES    . TUBAL LIGATION       No outpatient medications have been marked as taking for the 04/01/19 encounter (Appointment) with Lorretta Harp, MD.     Allergies:   Penicillins, Cefaclor, Latex, Metronidazole, Naproxen, Nsaids, Septra [bactrim], Sulfonamide derivatives, and Tape   Social History   Tobacco Use  . Smoking status: Former Smoker    Packs/day: 0.50    Years: 40.00    Pack years: 20.00    Types: Cigarettes    Quit date: 02/13/2011    Years since quitting: 8.1  . Smokeless tobacco: Never Used  Substance Use Topics  . Alcohol use: No  . Drug use: No     Family Hx: The patient's family history includes Aplastic anemia in an other family member; Arthritis in her mother; Diverticulitis in her sister; Heart disease in her unknown relative.  ROS:   Please see the history of present illness.     All other systems reviewed and are negative.   Prior CV studies:   The following studies were reviewed today:  Carotid Doppler  studies performed 02/19/2019  Labs/Other Tests and Data Reviewed:    EKG:  No ECG reviewed.  Recent Labs: 03/27/2019: ALT 11; BUN 20; Creat 0.80; Hemoglobin 13.1; Platelets 243; Potassium 4.6; Sodium 140; TSH 0.54   Recent Lipid Panel Lab Results  Component Value Date/Time   CHOL 133 07/14/2018 11:49 AM   CHOL 124 02/27/2017 09:40 AM   TRIG 88 07/14/2018 11:49 AM   HDL 55 07/14/2018 11:49 AM   HDL 58 02/27/2017 09:40 AM   CHOLHDL 2.4 07/14/2018 11:49 AM   LDLCALC 61 07/14/2018 11:49 AM    Wt Readings from Last 3 Encounters:  03/27/19 115 lb (52.2 kg)  12/24/18 116 lb 12.8 oz (53 kg)  09/12/18 114 lb (51.7 kg)     Objective:    Vital Signs:  There were no vitals taken for this visit.   VITAL SIGNS:  reviewed patient's blood pressure measured by her at home today was 121/58.  A complete physical exam was not performed since this was a virtual telemedicine phone visit  ASSESSMENT & PLAN:    1. Essential hypertension-history  of essential hypertension blood pressure measured this morning palpation of 121/58.  She was on hydralazine and losartan in the past but she discontinued this on her own about a month ago because of symptomatic hypotension. 2. Hyperlipidemia-history of hyperlipidemia on atorvastatin with lipid profile performed 07/14/2018 revealing total cholesterol 133, LDL 61 HDL 55 3. Carotid artery disease-history of carotid artery disease with carotid duplex performed 02/19/2019 revealing moderately severe right ICA stenosis and moderate left.  We will continue to monitor this noninvasively on annual basis 4. Coronary artery disease-history of CAD with cath performed by Dr. Martinique several years ago revealing ostial ramus branch disease but otherwise no significant CAD unchanged from cath performed in 2010.  She denies chest pain or shortness of breath  COVID-19 Education: The signs and symptoms of COVID-19 were discussed with the patient and how to seek care for testing  (follow up with PCP or arrange E-visit).  The importance of social distancing was discussed today.  Time:   Today, I have spent 6 minutes with the patient with telehealth technology discussing the above problems.     Medication Adjustments/Labs and Tests Ordered: Current medicines are reviewed at length with the patient today.  Concerns regarding medicines are outlined above.   Tests Ordered: No orders of the defined types were placed in this encounter.   Medication Changes: No orders of the defined types were placed in this encounter.   Follow Up:  In Person in 6 month(s)  Signed, [name], MD  04/01/2019 8:56 AM    [place]

## 2019-04-01 NOTE — Telephone Encounter (Signed)
Patient  aware of 10/21 AVS instructions and verbalized understanding.  Letter including After Visit Summary and any other necessary documents to be mailed to the patient's address on file.  Staff message sent to Medical Records pool to mail AVS to patient address.  

## 2019-04-01 NOTE — Patient Instructions (Signed)
  Medication Instructions:  none If you need a refill on your cardiac medications before your next appointment, please call your pharmacy.   Lab work: none If you have labs (blood work) drawn today and your tests are completely normal, you will receive your results only by: Marland Kitchen MyChart Message (if you have MyChart) OR . A paper copy in the mail If you have any lab test that is abnormal or we need to change your treatment, we will call you to review the results.  Testing/Procedures: Your physician has requested that you have a carotid duplex. This test is an ultrasound of the carotid arteries in your neck. It looks at blood flow through these arteries that supply the brain with blood. Allow one hour for this exam. There are no restrictions or special instructions. DUE September 2021. YOU WILL BE CONTACTED BY A SCHEDULER TO SET UP THIS APPOINTMENT.   Follow-Up: At [facility], you and your health needs are our priority.  As part of our continuing mission to provide you with exceptional heart care, we have created designated Provider Care Teams.  These Care Teams include your primary Cardiologist (physician) and Advanced Practice Providers (APPs -  Physician Assistants and Nurse Practitioners) who all work together to provide you with the care you need, when you need it. . You will need a follow up appointment in 6 months with Kerin Ransom, PA-C and in 12  MONTHS with Dr. [name].  Please call our office 2 months in advance to schedule each appointment.

## 2019-04-09 ENCOUNTER — Ambulatory Visit (INDEPENDENT_AMBULATORY_CARE_PROVIDER_SITE_OTHER): Payer: Medicare Other | Admitting: Family Medicine

## 2019-04-09 ENCOUNTER — Other Ambulatory Visit: Payer: Self-pay

## 2019-04-09 ENCOUNTER — Encounter: Payer: Self-pay | Admitting: Family Medicine

## 2019-04-09 VITALS — BP 140/60 | HR 58 | Temp 98.4°F | Resp 12 | Ht 61.0 in | Wt 114.0 lb

## 2019-04-09 DIAGNOSIS — F329 Major depressive disorder, single episode, unspecified: Secondary | ICD-10-CM | POA: Diagnosis not present

## 2019-04-09 DIAGNOSIS — R5383 Other fatigue: Secondary | ICD-10-CM | POA: Diagnosis not present

## 2019-04-09 DIAGNOSIS — I6523 Occlusion and stenosis of bilateral carotid arteries: Secondary | ICD-10-CM

## 2019-04-09 DIAGNOSIS — F32A Depression, unspecified: Secondary | ICD-10-CM

## 2019-04-09 NOTE — Progress Notes (Signed)
Subjective:    Patient ID: Brittany Kirk, female    DOB: [birth year], 73 y.o.   MRN: [id number]  Hypertension   Patient was recently admitted to the hospital.  I have copied relevant portions of the discharge summary and included them below for my reference:  Admit Date: 10/11/2017 Discharge Date: 10/12/2017  Primary Care Provider: Susy Frizzle, MD Primary Cardiologist: Dr. Gwenlyn Found, MD  Discharge Diagnoses    Active Problems:   Unstable angina [facility])  History of Present Illness     73 year old female with history of CAD medically managed as detailed below, emphysema, CKD stage III, HTN, HLD, breast cancer s/p lumpectomy, carotid artery disease, prior tobacco abuse for 25 pack years quitting ~ 3 years prior, strong family history of heart disease with multiple siblings who passed away at a premature age from MIs, and GERD who was admitted to [facility] with chest pain on 10/11/2017.   Patient has history of a remote cardiac cath by Dr. Lia Foyer, MD, in 09/2008, that showed moderate calcification in the coronary arteries, high-grade ostial ramus intermedius at trifurcation location involving LAD, ramus and the AV circumflex. Medical therapy was pursued as lesion was not ideal for percutaneous intervention given his ostial location andit's relationship to the LAD and the left circumflex interface. Most recent ischemic evaluation by [facility] on 01/05/2017 that was negative for ischemia, no EKG changes, EF 83%, low risk scan. Over the fall and winter months of 2018, she had an upper and lower endoscopy that were both unrevealing. She was seen in the office on 5/3 for evaluation of chest pain. At that time, she noted increasing SOB, substernal chest pain that radiated to her back and left upper extremity. Her EKG showed no acute changes. She was transported via EMS to [facility].   Hospital Course     Consultants: none  Upon her arrival to [facility], troponin was cycled and she ruled out.  CXR showed a mild opacity of the right base. She underwent LHC on 10/11/2017 that showed single vessel CAD involving the ostium of the ramus intermediate branch. Cardiac cath was unchanged from prior study in 2010. Given prior concern regarding shifting plaque into the LAD or LCx with PCI. Patient had not been on any antianginal therapy and was noted to be severely hypertensive in the cath lab. Aggressive medical therapy and BP control was advised. She was started on Coreg and Imdur. If she continues to have refractory angina despite optimal medical therapy and BP control, PCI of the ramus could be considered. Post cath labs showed stable renal function. In the evening of 5/3, the patient was hypotensive with systolic BP in the 49F to 80s. There were no complications from her cardiac cath site. PM Coreg was held. She was given IV saline bolus with improvement in BP. Initial BP this morning was soft in the 02O systolic. Coreg was held again. BP 125 mmHg currently. Patient has explained to Dr. Rayann Heman that she only wants to take her prior to admission medications upon discharge and she will not be sent home on any new medications. She has been advised to follow up with Dr. Gwenlyn Found for further medical management (message has been sent to our office).   The patient's right femoral cardiac cath site has been examined and is healing well without issues at this time. The patient has been seen by Dr. Rayann Heman, MD and felt to be stable for discharge today. All follow up appointments have been  made. Discharge medications are listed below. Prescriptions have been reviewed with the patient and sent in to their pharmacy.  _____________  10/28/17 Patient is here today complaining of pain going up and down her back, tremor which is constant, and feeling tired all the time.  I reviewed her past records and she is also had a CT scan of the chest abdomen and pelvis in March of this year which was reassuring.  Those records are copied  below: IMPRESSION: No findings specific for recurrent or metastatic disease.  Status post right axillary lymph node dissection.  Faint subpleural micronodularity measuring up to 3 mm, predominantly in the bilateral upper lobes. Consider follow-up CT chest in 6 months to confirm stability. However, this appearance is not worrisome for metastatic disease.  1.8 cm left adrenal nodule, only minimally increased from 2014, suggesting a benign adrenal adenoma.  Can now is clear  Last year, I had seen the patient for persistent nausea and weight loss.  Work-up at that time revealed no specific etiology.  My working diagnosis was most likely anxiety and stress.  Today the patient states that she has had burning stinging pain radiating from her neck to her tailbone and from her tailbone up to her neck now for several months.  She also reports worsening weakness in her arms and legs.  She reports worsening essential tremor.  This now involves her head and neck as well as both arms.  It worsens with activity and is present with rest.  She reports continued weight loss.  She reports feeling extremely tired and weak.  Patient saw neurology last year and had an MRI of the brain as well as cervical spine that was essentially normal.  Patient states that if I sent her to a neurologist she wants to second opinion.  At that time, my plan was: I spent approximately 30 minutes with the patient today conducting her history and her exam.  I have also spent a great deal of time reviewing her past medical records over the last several months including her referral to neurology, her follow-up with her oncologist, her recent admission to cardiology.  Given her unusual radicular almost neuropathic pain, I reviewed her records and found that she recently had a TSH in May of this year that was normal.  She had a vitamin B12 level checked in October 2018 that was normal.  She had an MRI in February 2018 of her neck that was  normal.  She has had a CT scan of the chest abdomen and pelvis that was normal or at least showed no specific cause of her symptoms.  As I explained to the patient, she has had numerous somatic complaints over the last 12 to 18 months with no specific finding on thorough diagnostic work-up.  I believe this could be related to anxiety, stress, and underlying depression.  Patient disagrees with this assessment.  She states that emotionally she feels fine.  Therefore I will proceed with an MRI of the cervical and lumbar spine to evaluate for any abnormalities that would explain her radicular pain and neuropathic pain that radiates from her neck to her tailbone and from her tailbone to her neck that may also cause her subjective weakness in her arms and legs.  Differential diagnosis includes multiple sclerosis.  Her essential tremor is certainly worsening however I will defer that at the present time until I have the results of her MRI.  If MRI shows no specific cause for symptoms, I will  revisit possible somatization disorder.    11/05/17 Patient called 5/21 and asked me to not order the MRI.  However the patient has an appointment already scheduled for Saturday.  She now states that she would like to keep that appointment.  She is here today continuing to complain of a burning heat warm sensation radiating from her neck to her tailbone and from her tailbone to her neck.  The pain, for lack of a better word, comes and goes with no specific cause and no specific alleviating factor.  She also continues to report nausea.  Nothing has helped her nausea.. Wt Readings from Last 3 Encounters:  04/01/19 112 lb (50.8 kg)  03/27/19 115 lb (52.2 kg)  12/24/18 116 lb 12.8 oz (53 kg)   Thankfully her weight has remained relatively stable over the last few weeks as evidenced above.  She denies any vomiting.  She has had an exhaustive GI work-up last year with no specific cause for her nausea.  As I explained to the patient  and her last visit, I believe some the symptoms could be conversion disorder essentially.  I wonder if stress and underlying depression may be causing a myriad of somatic complaints.  She does report depression.  She reports poor energy.  She reports fatigue.  She reports poor appetite.  She denies trouble sleeping.  She denies suicidal ideation.  She does report loss of interest/anhedonia.  She independently discontinued Lexapro due to perceived lack of benefit.   At that time, my plan was: I am not certain of the diagnosis but I am concerned that some of this may be conversion disorder and that some of her physical symptoms could be due to to underlying depression/stress/etc.  I am awaiting the results of the MRI but I have recommended starting the patient on Cymbalta 60 mg a day and rechecking in 4 weeks to see if her symptoms will improve.  I also believe there may be added benefit given some of the effect Cymbalta has been treating neuropathic pain.  Await the results of the MRI as previously scheduled.  12/09/17 Patient discontinued Lexapro and began Cymbalta 60 mg a day.  Patient states that the nausea has improved.  Her resting tremor has improved.  She still has very little appetite and has lost an additional 2 pounds since her last appointment.  She no longer has the neuropathic pain radiating from her head to her tailbone and from her tailbone to her head.  MRI of the cervical spine and lumbar spine revealed only mild spondylosis but no explanation for her symptoms.  Therefore I still believe we are dealing with conversion disorder secondary to uncontrolled depression and anxiety.  I explained this to the patient in length today.  She states that she feels that her depression has not improved since switching to Cymbalta.  She actually feels more depressed in the afternoons than previously.  She denies any panic attacks.  She denies any suicidal ideation.  At that time, my plan was: I am heartened by  the fact the nausea has improved, the pain has improved, and the tremor has improved.  I feel we are on the right track.  Of asked the patient to allow the Cymbalta 2-4 more weeks to take full effect.  At that point if her depression is not any better, I would consider switching the patient to Prozac as an appetite stimulant coupled with Wellbutrin in place of the Cymbalta.  However I would still focus on  treating underlying anxiety and depression as a cause of her physical symptoms as stated earlier.  01/06/18 I am very happy to report that the patient feels much better.  Her depression is improving.  Furthermore her anxiety is improving.  She is taking less and less diazepam.  She has been attending a class at her church regarding depression and anxiety and she feels that this is been extremely beneficial to her being able to talk to other people who have experience what she is experiencing and know how she feels.  The anxiety is improving.  She is sleeping better.  She continues to deny any suicidal ideation.  She states that she feels much happier.  She is still battling depression but it has improved substantially.  She reports less anhedonia.  She reports more energy and better concentration.  At that time, my plan was: I am so happy that the patient is benefiting from treatment.  Continue Cymbalta 60 mg a day.  We did discuss starting a low-dose Klonopin 0.5 mg p.o. twice daily on a scheduled basis to try to help calm anxiety.  Patient is actually feeling better and wants to avoid additional medication and continue on her current treatment regimen for the present time.  I believe this is completely appropriate.  Recheck the patient in 3 to 6 months or as needed.  02/17/18 Saw Cardiology on 02/14/18.  BP was 190/80.  Reviewed their note.  Losartan 50 mg poqday was resumed.  She walked in this morning complaining of dizziness and headache asking for her BP to be checked.  Initially 190/82.  She was then  placed on my schedule.  Patient denies any chest pain.  She denies any shortness of breath.  She denies any oliguria or hematuria.  She denies any vision changes.  The headache is mild and seems to be worse when her blood pressure is high.  Over the weekend, her blood pressure has fluctuated quite a bit.  The lowest she has seen her blood pressure is 140/86.  The highest she is seen his blood pressure is 190/80.  I personally repeated her blood pressure and found it to be 190/82 as well.  She is compliant with the losartan.  On her exam today there is no papilledema.  There is no evidence of endorgan damage.  EKG shows normal sinus rhythm with no ischemia no ST segment elevation, no T wave inversions or ST segment depression, no sign of left ventricular strain.  At that time, my plan was: There is no evidence of endorgan damage.  Therefore I believe the patient is hypertensive urgency.  I have asked her to continue losartan 50 mg a day and supplement with hydrochlorothiazide 25 mg a day.  I will check a CBC as well as a CMP to evaluate for renal dysfunction.  Recheck the patient on Friday.  At that time hopefully we will see her blood pressure in the 539J systolic.  If still elevated in the 190s, I would recommend switching to clonidine and evaluating for renal artery stenosis.  Patient is to go to the emergency room if she develops any symptoms of end organ damage including severe headache, vision changes, chest pain, shortness of breath, oliguria, hematuria.  She agrees.  02/28/18 Patient saw my partner last Friday, September 13.  After the addition of hydrochlorothiazide, her blood pressure began to drop precipitously.  Therefore my partner reduce her hydrochlorothiazide to 12.5 mg and continued her on losartan 50 mg a day.  She presents today with 1 week of additional blood pressures.  Her morning blood pressures are consistently high between 443 and 154 systolic.  Diastolic blood pressures always well  controlled.  She is taking hydrochlorothiazide and losartan at morning around 9:00.  Her afternoon blood pressures are well controlled and at times even low averaging between 008 and 676 systolic.  She is checking her blood pressures 5 and 6 times a day.  I reviewed the results of her carotid Doppler ordered by her cardiologist.  There was bilateral internal carotid artery stenosis of 40 to 59%.  There also coincidental findings of irregular complex nodules and a thyroid ultrasound was recommended.  Patient now wants to discontinue Cymbalta.  She states that she thinks the medication is not helping anymore the Lexapro did and she does not want to stay on the medication.  At that time, my plan was: First I need to work-up the thyroid nodules.  I will obtain a TSH, free T3, and free T4 to determine if they are hot or cold nodules.  I would also obtain a thyroid ultrasound to determine if these nodules require fine-needle biopsy.  Second I am concerned about her labile blood pressure.  If thyroid test is normal, I would consider working up for possible pheochromocytoma and renal artery stenosis.  I will begin by repeating a BMP after the addition of losartan to look for any evidence of renal insufficiency which would be consistent with renal artery stenosis.  I have recommended that she take losartan 50 mg p.o. twice daily to spread the medication out over a 24-hour period more consistently and continue hydrochlorothiazide 12.5 mg in the morning.  I hope that this will help bring down her blood pressures in the morning but avoid hypotension in the evening.  Recheck blood pressure in 1 week.  If blood pressures remain labile, I would proceed with a 24-hour urine collection to evaluate for pheochromocytoma particular given the previous history of a "benign adrenal adenoma" seen on previous CT.  I have also recommended that we not change Cymbalta at the present time to avoid possible confounding symptoms due to  withdrawal of medication or worsening anxiety.  Once we have the blood pressure adequately controlled and the thyroid nodules worked up, we can certainly revisit discontinuation of Cymbalta.  I do not believe the Cymbalta is causing the hypertension.  There is only occasional report of 2% of patients experiencing elevated blood pressure on Cymbalta and I feel that this is unlikely for this patient  07/14/18  Thyroid US: IMPRESSION: 1. Findings suggestive of multinodular goiter. 2. None of the discretely measured thyroid nodules meet imaging criteria to recommend percutaneous sampling or continued dedicated follow-up.  Since I last saw the patient, she saw her cardiologist Dr. Gwenlyn Found and is currently seeing the Pharm.D. at the cardiology clinic to better manage her blood pressure.  They have increased her losartan 100 mg a day.  She continues to have morning blood pressures in the 60-1 70 range with the evening blood pressures with a systolic blood pressure of 100 despite taking her blood pressure medicine in the evening.  She reports difficulty sleeping.  She states that she will only sleep 1 or 2 hours and then she awakens and is unable to go back to sleep.  She also reports a dry mouth.  She is not using Valium.  She wants to wean off Cymbalta.  She does not find that the Cymbalta is helping her anxiety or her  depression.  She like to see how she would do off the medication.  She denies any chest pain shortness of breath or dyspnea on exertion.  At that time, my plan was: Blood pressure today is still elevated.  I will defer management to her cardiologist that she has an appointment already on Wednesday to see them and discuss this to avoid polypharmacy.  I will wean the patient off Cymbalta and decrease to 30 mg a day for 2 weeks then 30 mg every other day for 2 weeks then discontinue the medication.  If the depression worsens, the patient would like to go back on Lexapro because she feels that that  medication worked better.  We will add trazodone 25 to 50 mg p.o. nightly for insomnia and then reassess the patient in 1 month.  08/11/18 Patient saw cardiology who put her on hydralazine 25 mg 3 times a day for her blood pressure.  Her blood pressure is elevated today at 188/70.  I rechecked it and found it to be 170/64.  However she presents today with several concerns.  Her primary concern is swelling in her ankles and feet.  I removed the patient walks in shoes.  There is no significant swelling today in her ankles or in her feet.  There is no pitting edema.  Patient states that it waxes and wanes.  However there is no appreciable edema on exam today.  What is concerning is that distal to the MTP joints on both feet, the toes are purple.  They also have sluggish capillary refill less than 5 seconds.  Findings are uniform and symmetric bilaterally concerning for Raynaud's phenomenon versus arterial insufficiency to the toes.  Patient denies any pain in the feet but she does state that her legs feel cold all the time.  She also states that her tremor is worsening.  She states that she is more shaky than normal.  She has bilateral essential tremor in both hands.  She also has an essential tremor in her neck and head.  She has never tried any kind of medication for essential tremor.  She has never tried propranolol or primidone.  She also reports feeling anxious inside.  She denies that the anxiety worsened after she decrease the Cymbalta.  She is weaning off the Cymbalta but she states that Cymbalta never helped with anxiety.  The anxiety waxes and wanes.  She did feel that the Lexapro was more beneficial than the Cymbalta but still not sufficient.  At that time, my plan was: Generalized anxiety disorder with depression.  Discontinue Cymbalta and replace with Prozac 20 mg a day and reassess the patient in 3 weeks.  Essential tremor.  Once the patient is tolerating Prozac I would add propranolol 20 mg 3 times a  day as needed for tremor and elevated blood pressure.  If the propranolol helps, we could then switch to a long-acting dose depending on how much propranolol the patient is taking per day.  Raynaud's phenomenon.  Schedule arterial Dopplers with ABIs and TBI's of the lower extremities to evaluate for any evidence of peripheral artery disease.  If none is present, amlodipine could be used to help treat this however this would exacerbate the swelling with the patient is reporting.  Therefore if there is no significant peripheral artery disease, I would recommend against treating the Raynaud's phenomenon with medication unless it becomes problematic.  Recheck the patient in 3 weeks  03/27/19 Patient again presents complaining of severe fatigue.  She  states that she has very little energy.  She states that even after sleeping 8 hours every evening, she wakes up and still feels extremely tired like she could fall asleep.  Is very hard for her to get out of bed.  She denies any suicidal ideation.  She denies any hallucinations or delusions however she does report worsening depression.  The depression seems to have only been compounded by the COVID-19 pandemic and the mandatory quarantine.  She denies any chest pain or shortness of breath or dyspnea on exertion.  She denies any nausea or vomiting or diarrhea or abdominal pain.  She denies any snoring or witnessed apneic episodes.  She does report hypersomnolence but she does not feel that she has sleep apnea or narcolepsy.  She just feels tired and fatigued all the time.  Extensive work-up last year found no physical explanation.  AT that time, my plan was: I had a long discussion today with the patient.  I believe this is most likely fatigue due to depression.  We discussed a referral to neurology for split level sleep study to evaluate for sleep apnea.  However I believe the patient most likely has chronic fatigue syndrome or more likely depression and fatigue  related to this.  We discussed the risk and benefits and the patient is willing to try methylphenidate 10 mg p.o. every morning.  She will monitor for worsening anxiety, worsening tremor, tachycardia, or chest pain.  We will need to monitor her blood pressure closely.  Reassess the patient in 2 weeks or sooner if symptoms worsen.  04/09/19 Patient read the side effect profile for the Ritalin.  After reading this and discussing her situation with her cardiologist, she elected not to try the Ritalin for fatigue secondary to depression.  She is here today for follow-up.  She has been taking ginseng over-the-counter and does feel some improvement in her energy level.  Over the last year we have tried a combination of Lexapro, then Cymbalta, then Prozac.  She has seen very little benefit with either of these medications.  She was unable to afford Trintellix.  She has not tried augmentation with Wellbutrin.  Due to her medical history I would want to avoid Rexulti or Abilify unless absolutely necessary.  She is here today to discuss other options. Past Medical History:  Diagnosis Date   Allergic rhinitis    Anxiety    Asymptomatic bilateral carotid artery stenosis    40-59% (02/2018)   Breast cancer ([place]) 2015   Right Breast Cancer   CAD (coronary artery disease)    Cancer ([facility])    Depression    Emphysema    no longer uses inhalers   Gallstones    nausea, pain upper right abdomen   GERD (gastroesophageal reflux disease)    H/O hiatal hernia    History of skin cancer    Hyperlipidemia    Hypertension    Multinodular goiter (nontoxic)    Tobacco user    Wears dentures    top   Past Surgical History:  Procedure Laterality Date   BREAST LUMPECTOMY Right 2015   CHOLECYSTECTOMY  07/08/2012   Procedure: LAPAROSCOPIC CHOLECYSTECTOMY WITH INTRAOPERATIVE CHOLANGIOGRAM;  Surgeon: Gayland Curry, MD,FACS;  Location: [facility];  Service: General;  Laterality: N/A;  Laparoscopic  Cholecystectomy with Intraoperative Cholangiogram   COLONOSCOPY     ESOPHAGOSCOPY N/A 05/29/2017   Procedure: ESOPHAGOSCOPY;  Surgeon: Clarene Essex, MD;  Location: [facility];  Service: Endoscopy;  Laterality: N/A;  botox  injection   HAND SURGERY Left    wrist   LEFT HEART CATH AND CORONARY ANGIOGRAPHY N/A 10/11/2017   Procedure: LEFT HEART CATH AND CORONARY ANGIOGRAPHY;  Surgeon: Martinique, Peter M, MD;  Location: [facility];  Service: Cardiovascular;  Laterality: N/A;   TONSILECTOMY, ADENOIDECTOMY, BILATERAL MYRINGOTOMY AND TUBES     TUBAL LIGATION     Current Outpatient Medications on File Prior to Visit  Medication Sig Dispense Refill   anastrozole (ARIMIDEX) 1 MG tablet Take 1 tablet (1 mg total) by mouth daily. 90 tablet 3   aspirin 81 MG tablet Take 81 mg by mouth daily.     atorvastatin (LIPITOR) 40 MG tablet TAKE 1 TABLET BY MOUTH EVERYDAY AT BEDTIME 90 tablet 1   Cyanocobalamin (B-12) 2500 MCG TABS Take 2,500 mcg by mouth daily.     cyclobenzaprine (FLEXERIL) 10 MG tablet Take 1 tablet (10 mg total) by mouth 3 (three) times daily as needed for muscle spasms. (Patient not taking: Reported on 12/24/2018) 30 tablet 0   diazepam (VALIUM) 5 MG tablet Take 5 mg by mouth as needed.   3   FLUoxetine (PROZAC) 20 MG capsule TAKE 1 CAPSULE BY MOUTH EVERY DAY 90 capsule 2   hydrALAZINE (APRESOLINE) 25 MG tablet Take 1 tablet by mouth once a day in the morning. (Patient not taking: Reported on 04/01/2019) 30 tablet 11   losartan (COZAAR) 100 MG tablet Take 1 tablet by mouth daily     methylphenidate (RITALIN) 10 MG tablet Take 1 tablet (10 mg total) by mouth every morning. (Patient not taking: Reported on 04/01/2019) 30 tablet 0   Multiple Vitamin (MULTIVITAMIN) tablet Take 1 tablet by mouth daily.     mupirocin ointment (BACTROBAN) 2 % Place 1 application into the nose 2 (two) times daily. 22 g 0   omeprazole (PRILOSEC) 40 MG capsule Take 40 mg by mouth daily at 3 pm.       traZODone (DESYREL) 50 MG tablet Take 0.5-1 tablets (25-50 mg total) by mouth at bedtime as needed for sleep. 90 tablet 3   No current facility-administered medications on file prior to visit.    Allergies  Allergen Reactions   Penicillins Anaphylaxis, Itching, Swelling and Rash    Has patient had a PCN reaction causing immediate rash, facial/tongue/throat swelling, SOB or lightheadedness with hypotension: Yes Has patient had a PCN reaction causing severe rash involving mucus membranes or skin necrosis: No Has patient had a PCN reaction that required hospitalization: Yes Has patient had a PCN reaction occurring within the last 10 years: No If all of the above answers are "NO", then may proceed with Cephalosporin use.    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RETIRED  Social Needs   Financial resource strain: Not on file   Food insecurity    Worry: Not on file  Inability: Not on file   Transportation needs    Medical: Not on file    Non-medical: Not on file  Tobacco Use   Smoking status: Former Smoker    Packs/day: 0.50    Years: 40.00    Pack years: 20.00    Types: Cigarettes    Quit date: 02/13/2011    Years since quitting: 8.1   Smokeless tobacco: Never Used  Substance and Sexual Activity   Alcohol use: No   Drug use: No   Sexual activity: Not on file  Lifestyle   Physical activity     Days per week: Not on file    Minutes per session: Not on file   Stress: Not on file  Relationships   Social connections    Talks on phone: Not on file    Gets together: Not on file    Attends religious service: Not on file    Active member of club or organization: Not on file    Attends meetings of clubs or organizations: Not on file    Relationship status: Not on file   Intimate partner violence    Fear of current or ex partner: Not on file    Emotionally abused: Not on file    Physically abused: Not on file    Forced sexual activity: Not on file  Other Topics Concern   Not on file  Social History Narrative   Not on file     Review of Systems  All other systems reviewed and are negative.      Objective:   Physical Exam Vitals signs reviewed.  Constitutional:      General: She is not in acute distress.    Appearance: She is well-developed. She is not diaphoretic.  HENT:     Head: Normocephalic and atraumatic.     Right Ear: External ear normal.     Left Ear: External ear normal.     Mouth/Throat:     Pharynx: No oropharyngeal exudate.  Eyes:     Conjunctiva/sclera: Conjunctivae normal.     Pupils: Pupils are equal, round, and reactive to light.  Neck:     Musculoskeletal: Normal range of motion.     Thyroid: No thyromegaly.     Vascular: No JVD.  Cardiovascular:     Rate and Rhythm: Normal rate and regular rhythm.     Heart sounds: Normal heart sounds. No murmur. No friction rub. No gallop.   Pulmonary:     Effort: Pulmonary effort is normal. No respiratory distress.     Breath sounds: Normal breath sounds. No stridor. No wheezing or rales.  Abdominal:     General: Bowel sounds are normal. There is no distension.     Palpations: Abdomen is soft.     Tenderness: There is no abdominal tenderness. There is no guarding.  Lymphadenopathy:     Cervical: No cervical adenopathy.  Neurological:     Mental Status: She is alert and oriented to person, place, and  time.     Cranial Nerves: No cranial nerve deficit.     Sensory: No sensory deficit.     Motor: Tremor present. No atrophy, abnormal muscle tone or seizure activity.     Deep Tendon Reflexes: Reflexes are normal and symmetric.          Assessment & Plan:  I certainly understand the patient's hesitancy to take Ritalin and in fact I concur with her unless the fatigue is debilitating.  However at this point the  next step would be to get a psychiatrist involved for second opinion regarding treatment of her depression.  Patient would like to think about this for a while.  At the present time she does not want to make any changes in her medication.  She will call me if she changes her mind and decides she would like to speak with a psychiatrist however the present time we will continue her current therapy.

## 2019-04-10 ENCOUNTER — Ambulatory Visit: Payer: Medicare Other | Admitting: Family Medicine

## 2019-04-15 ENCOUNTER — Telehealth: Payer: Self-pay | Admitting: Family Medicine

## 2019-04-15 NOTE — Telephone Encounter (Signed)
Patient called in states she is ready for you to refer her to a psychiatrist. She doesn't know of one but she would like you to send her to the best one.  CB# [phone]

## 2019-04-16 ENCOUNTER — Other Ambulatory Visit: Payer: Self-pay | Admitting: Family Medicine

## 2019-04-16 DIAGNOSIS — R5383 Other fatigue: Secondary | ICD-10-CM

## 2019-04-16 DIAGNOSIS — F329 Major depressive disorder, single episode, unspecified: Secondary | ICD-10-CM

## 2019-04-16 DIAGNOSIS — F321 Major depressive disorder, single episode, moderate: Secondary | ICD-10-CM

## 2019-04-16 NOTE — Telephone Encounter (Signed)
Ok, order placed, please uses [facility]

## 2019-04-16 NOTE — Telephone Encounter (Signed)
Will do!

## 2019-04-24 ENCOUNTER — Other Ambulatory Visit: Payer: Self-pay

## 2019-04-24 DIAGNOSIS — Z85828 Personal history of other malignant neoplasm of skin: Secondary | ICD-10-CM | POA: Diagnosis not present

## 2019-04-24 DIAGNOSIS — L245 Irritant contact dermatitis due to other chemical products: Secondary | ICD-10-CM | POA: Diagnosis not present

## 2019-04-27 ENCOUNTER — Encounter: Payer: Self-pay | Admitting: Family Medicine

## 2019-04-27 ENCOUNTER — Other Ambulatory Visit: Payer: Self-pay

## 2019-04-27 ENCOUNTER — Ambulatory Visit (INDEPENDENT_AMBULATORY_CARE_PROVIDER_SITE_OTHER): Payer: Medicare Other | Admitting: Family Medicine

## 2019-04-27 VITALS — BP 166/70 | HR 60 | Temp 96.9°F | Resp 18 | Ht 61.0 in | Wt 115.0 lb

## 2019-04-27 DIAGNOSIS — M542 Cervicalgia: Secondary | ICD-10-CM

## 2019-04-27 DIAGNOSIS — I6523 Occlusion and stenosis of bilateral carotid arteries: Secondary | ICD-10-CM

## 2019-04-27 DIAGNOSIS — G250 Essential tremor: Secondary | ICD-10-CM | POA: Diagnosis not present

## 2019-04-27 MED ORDER — MELOXICAM 15 MG PO TABS: 15.0000 mg | ORAL_TABLET | Freq: Every day | ORAL | 0 refills | Status: DC

## 2019-04-27 NOTE — Progress Notes (Signed)
Subjective:    Patient ID: Brittany Kirk, female    DOB: [birth year], 73 y.o.   MRN: [id number]  Hypertension   Patient was recently admitted to the hospital.  I have copied relevant portions of the discharge summary and included them below for my reference:  Admit Date: 10/11/2017 Discharge Date: 10/12/2017  Primary Care Provider: Susy Frizzle, MD Primary Cardiologist: Dr. Gwenlyn Found, MD  Discharge Diagnoses    Active Problems:   Unstable angina [facility])  History of Present Illness     73 year old female with history of CAD medically managed as detailed below, emphysema, CKD stage III, HTN, HLD, breast cancer s/p lumpectomy, carotid artery disease, prior tobacco abuse for 25 pack years quitting ~ 3 years prior, strong family history of heart disease with multiple siblings who passed away at a premature age from MIs, and GERD who was admitted to [facility] with chest pain on 10/11/2017.   Patient has history of a remote cardiac cath by Dr. Lia Foyer, MD, in 09/2008, that showed moderate calcification in the coronary arteries, high-grade ostial ramus intermedius at trifurcation location involving LAD, ramus and the AV circumflex. Medical therapy was pursued as lesion was not ideal for percutaneous intervention given his ostial location andit's relationship to the LAD and the left circumflex interface. Most recent ischemic evaluation by [facility] on 01/05/2017 that was negative for ischemia, no EKG changes, EF 83%, low risk scan. Over the fall and winter months of 2018, she had an upper and lower endoscopy that were both unrevealing. She was seen in the office on 5/3 for evaluation of chest pain. At that time, she noted increasing SOB, substernal chest pain that radiated to her back and left upper extremity. Her EKG showed no acute changes. She was transported via EMS to [facility].   Hospital Course     Consultants: none  Upon her arrival to [facility], troponin was cycled and she ruled out.  CXR showed a mild opacity of the right base. She underwent LHC on 10/11/2017 that showed single vessel CAD involving the ostium of the ramus intermediate branch. Cardiac cath was unchanged from prior study in 2010. Given prior concern regarding shifting plaque into the LAD or LCx with PCI. Patient had not been on any antianginal therapy and was noted to be severely hypertensive in the cath lab. Aggressive medical therapy and BP control was advised. She was started on Coreg and Imdur. If she continues to have refractory angina despite optimal medical therapy and BP control, PCI of the ramus could be considered. Post cath labs showed stable renal function. In the evening of 5/3, the patient was hypotensive with systolic BP in the 49F to 80s. There were no complications from her cardiac cath site. PM Coreg was held. She was given IV saline bolus with improvement in BP. Initial BP this morning was soft in the 02O systolic. Coreg was held again. BP 125 mmHg currently. Patient has explained to Dr. Rayann Heman that she only wants to take her prior to admission medications upon discharge and she will not be sent home on any new medications. She has been advised to follow up with Dr. Gwenlyn Found for further medical management (message has been sent to our office).   The patient's right femoral cardiac cath site has been examined and is healing well without issues at this time. The patient has been seen by Dr. Rayann Heman, MD and felt to be stable for discharge today. All follow up appointments have been  made. Discharge medications are listed below. Prescriptions have been reviewed with the patient and sent in to their pharmacy.  _____________  10/28/17 Patient is here today complaining of pain going up and down her back, tremor which is constant, and feeling tired all the time.  I reviewed her past records and she is also had a CT scan of the chest abdomen and pelvis in March of this year which was reassuring.  Those records are copied  below: IMPRESSION: No findings specific for recurrent or metastatic disease.  Status post right axillary lymph node dissection.  Faint subpleural micronodularity measuring up to 3 mm, predominantly in the bilateral upper lobes. Consider follow-up CT chest in 6 months to confirm stability. However, this appearance is not worrisome for metastatic disease.  1.8 cm left adrenal nodule, only minimally increased from 2014, suggesting a benign adrenal adenoma.  Can now is clear  Last year, I had seen the patient for persistent nausea and weight loss.  Work-up at that time revealed no specific etiology.  My working diagnosis was most likely anxiety and stress.  Today the patient states that she has had burning stinging pain radiating from her neck to her tailbone and from her tailbone up to her neck now for several months.  She also reports worsening weakness in her arms and legs.  She reports worsening essential tremor.  This now involves her head and neck as well as both arms.  It worsens with activity and is present with rest.  She reports continued weight loss.  She reports feeling extremely tired and weak.  Patient saw neurology last year and had an MRI of the brain as well as cervical spine that was essentially normal.  Patient states that if I sent her to a neurologist she wants to second opinion.  At that time, my plan was: I spent approximately 30 minutes with the patient today conducting her history and her exam.  I have also spent a great deal of time reviewing her past medical records over the last several months including her referral to neurology, her follow-up with her oncologist, her recent admission to cardiology.  Given her unusual radicular almost neuropathic pain, I reviewed her records and found that she recently had a TSH in May of this year that was normal.  She had a vitamin B12 level checked in October 2018 that was normal.  She had an MRI in February 2018 of her neck that was  normal.  She has had a CT scan of the chest abdomen and pelvis that was normal or at least showed no specific cause of her symptoms.  As I explained to the patient, she has had numerous somatic complaints over the last 12 to 18 months with no specific finding on thorough diagnostic work-up.  I believe this could be related to anxiety, stress, and underlying depression.  Patient disagrees with this assessment.  She states that emotionally she feels fine.  Therefore I will proceed with an MRI of the cervical and lumbar spine to evaluate for any abnormalities that would explain her radicular pain and neuropathic pain that radiates from her neck to her tailbone and from her tailbone to her neck that may also cause her subjective weakness in her arms and legs.  Differential diagnosis includes multiple sclerosis.  Her essential tremor is certainly worsening however I will defer that at the present time until I have the results of her MRI.  If MRI shows no specific cause for symptoms, I will  revisit possible somatization disorder.    11/05/17 Patient called 5/21 and asked me to not order the MRI.  However the patient has an appointment already scheduled for Saturday.  She now states that she would like to keep that appointment.  She is here today continuing to complain of a burning heat warm sensation radiating from her neck to her tailbone and from her tailbone to her neck.  The pain, for lack of a better word, comes and goes with no specific cause and no specific alleviating factor.  She also continues to report nausea.  Nothing has helped her nausea.. Wt Readings from Last 3 Encounters:  04/27/19 115 lb (52.2 kg)  04/09/19 114 lb (51.7 kg)  04/01/19 112 lb (50.8 kg)   Thankfully her weight has remained relatively stable over the last few weeks as evidenced above.  She denies any vomiting.  She has had an exhaustive GI work-up last year with no specific cause for her nausea.  As I explained to the patient and her  last visit, I believe some the symptoms could be conversion disorder essentially.  I wonder if stress and underlying depression may be causing a myriad of somatic complaints.  She does report depression.  She reports poor energy.  She reports fatigue.  She reports poor appetite.  She denies trouble sleeping.  She denies suicidal ideation.  She does report loss of interest/anhedonia.  She independently discontinued Lexapro due to perceived lack of benefit.   At that time, my plan was: I am not certain of the diagnosis but I am concerned that some of this may be conversion disorder and that some of her physical symptoms could be due to to underlying depression/stress/etc.  I am awaiting the results of the MRI but I have recommended starting the patient on Cymbalta 60 mg a day and rechecking in 4 weeks to see if her symptoms will improve.  I also believe there may be added benefit given some of the effect Cymbalta has been treating neuropathic pain.  Await the results of the MRI as previously scheduled.  12/09/17 Patient discontinued Lexapro and began Cymbalta 60 mg a day.  Patient states that the nausea has improved.  Her resting tremor has improved.  She still has very little appetite and has lost an additional 2 pounds since her last appointment.  She no longer has the neuropathic pain radiating from her head to her tailbone and from her tailbone to her head.  MRI of the cervical spine and lumbar spine revealed only mild spondylosis but no explanation for her symptoms.  Therefore I still believe we are dealing with conversion disorder secondary to uncontrolled depression and anxiety.  I explained this to the patient in length today.  She states that she feels that her depression has not improved since switching to Cymbalta.  She actually feels more depressed in the afternoons than previously.  She denies any panic attacks.  She denies any suicidal ideation.  At that time, my plan was: I am heartened by the fact  the nausea has improved, the pain has improved, and the tremor has improved.  I feel we are on the right track.  Of asked the patient to allow the Cymbalta 2-4 more weeks to take full effect.  At that point if her depression is not any better, I would consider switching the patient to Prozac as an appetite stimulant coupled with Wellbutrin in place of the Cymbalta.  However I would still focus on treating underlying  anxiety and depression as a cause of her physical symptoms as stated earlier.  01/06/18 I am very happy to report that the patient feels much better.  Her depression is improving.  Furthermore her anxiety is improving.  She is taking less and less diazepam.  She has been attending a class at her church regarding depression and anxiety and she feels that this is been extremely beneficial to her being able to talk to other people who have experience what she is experiencing and know how she feels.  The anxiety is improving.  She is sleeping better.  She continues to deny any suicidal ideation.  She states that she feels much happier.  She is still battling depression but it has improved substantially.  She reports less anhedonia.  She reports more energy and better concentration.  At that time, my plan was: I am so happy that the patient is benefiting from treatment.  Continue Cymbalta 60 mg a day.  We did discuss starting a low-dose Klonopin 0.5 mg p.o. twice daily on a scheduled basis to try to help calm anxiety.  Patient is actually feeling better and wants to avoid additional medication and continue on her current treatment regimen for the present time.  I believe this is completely appropriate.  Recheck the patient in 3 to 6 months or as needed.  02/17/18 Saw Cardiology on 02/14/18.  BP was 190/80.  Reviewed their note.  Losartan 50 mg poqday was resumed.  She walked in this morning complaining of dizziness and headache asking for her BP to be checked.  Initially 190/82.  She was then placed on my  schedule.  Patient denies any chest pain.  She denies any shortness of breath.  She denies any oliguria or hematuria.  She denies any vision changes.  The headache is mild and seems to be worse when her blood pressure is high.  Over the weekend, her blood pressure has fluctuated quite a bit.  The lowest she has seen her blood pressure is 140/86.  The highest she is seen his blood pressure is 190/80.  I personally repeated her blood pressure and found it to be 190/82 as well.  She is compliant with the losartan.  On her exam today there is no papilledema.  There is no evidence of endorgan damage.  EKG shows normal sinus rhythm with no ischemia no ST segment elevation, no T wave inversions or ST segment depression, no sign of left ventricular strain.  At that time, my plan was: There is no evidence of endorgan damage.  Therefore I believe the patient is hypertensive urgency.  I have asked her to continue losartan 50 mg a day and supplement with hydrochlorothiazide 25 mg a day.  I will check a CBC as well as a CMP to evaluate for renal dysfunction.  Recheck the patient on Friday.  At that time hopefully we will see her blood pressure in the 829F systolic.  If still elevated in the 190s, I would recommend switching to clonidine and evaluating for renal artery stenosis.  Patient is to go to the emergency room if she develops any symptoms of end organ damage including severe headache, vision changes, chest pain, shortness of breath, oliguria, hematuria.  She agrees.  02/28/18 Patient saw my partner last Friday, September 13.  After the addition of hydrochlorothiazide, her blood pressure began to drop precipitously.  Therefore my partner reduce her hydrochlorothiazide to 12.5 mg and continued her on losartan 50 mg a day.  She presents  today with 1 week of additional blood pressures.  Her morning blood pressures are consistently high between 219 and 758 systolic.  Diastolic blood pressures always well controlled.  She is  taking hydrochlorothiazide and losartan at morning around 9:00.  Her afternoon blood pressures are well controlled and at times even low averaging between 832 and 549 systolic.  She is checking her blood pressures 5 and 6 times a day.  I reviewed the results of her carotid Doppler ordered by her cardiologist.  There was bilateral internal carotid artery stenosis of 40 to 59%.  There also coincidental findings of irregular complex nodules and a thyroid ultrasound was recommended.  Patient now wants to discontinue Cymbalta.  She states that she thinks the medication is not helping anymore the Lexapro did and she does not want to stay on the medication.  At that time, my plan was: First I need to work-up the thyroid nodules.  I will obtain a TSH, free T3, and free T4 to determine if they are hot or cold nodules.  I would also obtain a thyroid ultrasound to determine if these nodules require fine-needle biopsy.  Second I am concerned about her labile blood pressure.  If thyroid test is normal, I would consider working up for possible pheochromocytoma and renal artery stenosis.  I will begin by repeating a BMP after the addition of losartan to look for any evidence of renal insufficiency which would be consistent with renal artery stenosis.  I have recommended that she take losartan 50 mg p.o. twice daily to spread the medication out over a 24-hour period more consistently and continue hydrochlorothiazide 12.5 mg in the morning.  I hope that this will help bring down her blood pressures in the morning but avoid hypotension in the evening.  Recheck blood pressure in 1 week.  If blood pressures remain labile, I would proceed with a 24-hour urine collection to evaluate for pheochromocytoma particular given the previous history of a "benign adrenal adenoma" seen on previous CT.  I have also recommended that we not change Cymbalta at the present time to avoid possible confounding symptoms due to withdrawal of medication or  worsening anxiety.  Once we have the blood pressure adequately controlled and the thyroid nodules worked up, we can certainly revisit discontinuation of Cymbalta.  I do not believe the Cymbalta is causing the hypertension.  There is only occasional report of 2% of patients experiencing elevated blood pressure on Cymbalta and I feel that this is unlikely for this patient  07/14/18  Thyroid US: IMPRESSION: 1. Findings suggestive of multinodular goiter. 2. None of the discretely measured thyroid nodules meet imaging criteria to recommend percutaneous sampling or continued dedicated follow-up.  Since I last saw the patient, she saw her cardiologist Dr. Gwenlyn Found and is currently seeing the Pharm.D. at the cardiology clinic to better manage her blood pressure.  They have increased her losartan 100 mg a day.  She continues to have morning blood pressures in the 60-1 70 range with the evening blood pressures with a systolic blood pressure of 100 despite taking her blood pressure medicine in the evening.  She reports difficulty sleeping.  She states that she will only sleep 1 or 2 hours and then she awakens and is unable to go back to sleep.  She also reports a dry mouth.  She is not using Valium.  She wants to wean off Cymbalta.  She does not find that the Cymbalta is helping her anxiety or her depression.  She like to see how she would do off the medication.  She denies any chest pain shortness of breath or dyspnea on exertion.  At that time, my plan was: Blood pressure today is still elevated.  I will defer management to her cardiologist that she has an appointment already on Wednesday to see them and discuss this to avoid polypharmacy.  I will wean the patient off Cymbalta and decrease to 30 mg a day for 2 weeks then 30 mg every other day for 2 weeks then discontinue the medication.  If the depression worsens, the patient would like to go back on Lexapro because she feels that that medication worked better.  We  will add trazodone 25 to 50 mg p.o. nightly for insomnia and then reassess the patient in 1 month.  08/11/18 Patient saw cardiology who put her on hydralazine 25 mg 3 times a day for her blood pressure.  Her blood pressure is elevated today at 188/70.  I rechecked it and found it to be 170/64.  However she presents today with several concerns.  Her primary concern is swelling in her ankles and feet.  I removed the patient walks in shoes.  There is no significant swelling today in her ankles or in her feet.  There is no pitting edema.  Patient states that it waxes and wanes.  However there is no appreciable edema on exam today.  What is concerning is that distal to the MTP joints on both feet, the toes are purple.  They also have sluggish capillary refill less than 5 seconds.  Findings are uniform and symmetric bilaterally concerning for Raynaud's phenomenon versus arterial insufficiency to the toes.  Patient denies any pain in the feet but she does state that her legs feel cold all the time.  She also states that her tremor is worsening.  She states that she is more shaky than normal.  She has bilateral essential tremor in both hands.  She also has an essential tremor in her neck and head.  She has never tried any kind of medication for essential tremor.  She has never tried propranolol or primidone.  She also reports feeling anxious inside.  She denies that the anxiety worsened after she decrease the Cymbalta.  She is weaning off the Cymbalta but she states that Cymbalta never helped with anxiety.  The anxiety waxes and wanes.  She did feel that the Lexapro was more beneficial than the Cymbalta but still not sufficient.  At that time, my plan was: Generalized anxiety disorder with depression.  Discontinue Cymbalta and replace with Prozac 20 mg a day and reassess the patient in 3 weeks.  Essential tremor.  Once the patient is tolerating Prozac I would add propranolol 20 mg 3 times a day as needed for tremor and  elevated blood pressure.  If the propranolol helps, we could then switch to a long-acting dose depending on how much propranolol the patient is taking per day.  Raynaud's phenomenon.  Schedule arterial Dopplers with ABIs and TBI's of the lower extremities to evaluate for any evidence of peripheral artery disease.  If none is present, amlodipine could be used to help treat this however this would exacerbate the swelling with the patient is reporting.  Therefore if there is no significant peripheral artery disease, I would recommend against treating the Raynaud's phenomenon with medication unless it becomes problematic.  Recheck the patient in 3 weeks  03/27/19 Patient again presents complaining of severe fatigue.  She states that  she has very little energy.  She states that even after sleeping 8 hours every evening, she wakes up and still feels extremely tired like she could fall asleep.  Is very hard for her to get out of bed.  She denies any suicidal ideation.  She denies any hallucinations or delusions however she does report worsening depression.  The depression seems to have only been compounded by the COVID-19 pandemic and the mandatory quarantine.  She denies any chest pain or shortness of breath or dyspnea on exertion.  She denies any nausea or vomiting or diarrhea or abdominal pain.  She denies any snoring or witnessed apneic episodes.  She does report hypersomnolence but she does not feel that she has sleep apnea or narcolepsy.  She just feels tired and fatigued all the time.  Extensive work-up last year found no physical explanation.  AT that time, my plan was: I had a long discussion today with the patient.  I believe this is most likely fatigue due to depression.  We discussed a referral to neurology for split level sleep study to evaluate for sleep apnea.  However I believe the patient most likely has chronic fatigue syndrome or more likely depression and fatigue related to this.  We discussed the  risk and benefits and the patient is willing to try methylphenidate 10 mg p.o. every morning.  She will monitor for worsening anxiety, worsening tremor, tachycardia, or chest pain.  We will need to monitor her blood pressure closely.  Reassess the patient in 2 weeks or sooner if symptoms worsen.  04/09/19 Patient read the side effect profile for the Ritalin.  After reading this and discussing her situation with her cardiologist, she elected not to try the Ritalin for fatigue secondary to depression.  She is here today for follow-up.  She has been taking ginseng over-the-counter and does feel some improvement in her energy level.  Over the last year we have tried a combination of Lexapro, then Cymbalta, then Prozac.  She has seen very little benefit with either of these medications.  She was unable to afford Trintellix.  She has not tried augmentation with Wellbutrin.  Due to her medical history I would want to avoid Rexulti or Abilify unless absolutely necessary.  She is here today to discuss other options.  At that time, my plan was: I certainly understand the patient's hesitancy to take Ritalin and in fact I concur with her unless the fatigue is debilitating.  However at this point the next step would be to get a psychiatrist involved for second opinion regarding treatment of her depression.  Patient would like to think about this for a while.  At the present time she does not want to make any changes in her medication.  She will call me if she changes her mind and decides she would like to speak with a psychiatrist however the present time we will continue her current therapy.  04/27/19  MRI on 11/2017 Minimal disc bulging from C3-4 to C6-7 is unchanged and does not result in spinal stenosis, neural foraminal stenosis, or spinal cord mass effect. Very mild left-sided facet arthrosis is noted in the mid to lower cervical spine.  Patient presents today requesting a referral to see a neurosurgeon.  She  reports aching pain in her neck.  She denies any cervical radiculopathy.  She denies any weakness or numbness in her arms.  She denies any shooting neuropathic pain in her arms.  She does complain of an aching pain  in her neck and stiffness in her neck.  She takes Tylenol occasionally.  She will occasionally use Advil but not often.  She denies any falls or injuries recently.  She does have some mild tenderness to palpation in the left paraspinal area.  She would also like a referral to see her neurologist.  She sees [facility] neurology.  She does have an essential tremor and beta-blockers have not helped.  The next step would be to try primidone however the patient would like to discuss this with the neurologist prior to starting the medication which I believe is completely reasonable.  The patient request a referral to see an ENT.  She has a "bump" inside her left nostril just on the inside of the opening.  On examination today I do not see any bump or lesion.  There is nothing on visual inspection.  The patient has a hard time finding it with her own finger.  I do see some mild dry scaly skin but otherwise examination of her nostril is relatively normal Past Medical History:  Diagnosis Date   Allergic rhinitis    Anxiety    Asymptomatic bilateral carotid artery stenosis    40-59% (02/2018)   Breast cancer ([place]) 2015   Right Breast Cancer   CAD (coronary artery disease)    Cancer ([place])    Depression    Emphysema    no longer uses inhalers   Gallstones    nausea, pain upper right abdomen   GERD (gastroesophageal reflux disease)    H/O hiatal hernia    History of skin cancer    Hyperlipidemia    Hypertension    Multinodular goiter (nontoxic)    Tobacco user    Wears dentures    top   Past Surgical History:  Procedure Laterality Date   BREAST LUMPECTOMY Right 2015   CHOLECYSTECTOMY  07/08/2012   Procedure: LAPAROSCOPIC CHOLECYSTECTOMY WITH INTRAOPERATIVE  CHOLANGIOGRAM;  Surgeon: Gayland Curry, MD,FACS;  [facility]: [facility];  Service: General;  Laterality: N/A;  Laparoscopic Cholecystectomy with Intraoperative Cholangiogram   COLONOSCOPY     ESOPHAGOSCOPY N/A 05/29/2017   Procedure: ESOPHAGOSCOPY;  Surgeon: Clarene Essex, MD;  Location: [facility];  Service: Endoscopy;  Laterality: N/A;  botox injection   HAND SURGERY Left    wrist   LEFT HEART CATH AND CORONARY ANGIOGRAPHY N/A 10/11/2017   Procedure: LEFT HEART CATH AND CORONARY ANGIOGRAPHY;  Surgeon: Martinique, Peter M, MD;  Location: [place] [facility];  Service: Cardiovascular;  Laterality: N/A;   TONSILECTOMY, ADENOIDECTOMY, BILATERAL MYRINGOTOMY AND TUBES     TUBAL LIGATION     Current Outpatient Medications on File Prior to Visit  Medication Sig Dispense Refill   anastrozole (ARIMIDEX) 1 MG tablet Take 1 tablet (1 mg total) by mouth daily. 90 tablet 3   aspirin 81 MG tablet Take 81 mg by mouth daily.     atorvastatin (LIPITOR) 40 MG tablet TAKE 1 TABLET BY MOUTH EVERYDAY AT BEDTIME 90 tablet 1   Cyanocobalamin (B-12) 2500 MCG TABS Take 2,500 mcg by mouth daily.     diazepam (VALIUM) 5 MG tablet Take 5 mg by mouth as needed.   3   FLUoxetine (PROZAC) 20 MG capsule TAKE 1 CAPSULE BY MOUTH EVERY DAY 90 capsule 2   losartan (COZAAR) 100 MG tablet Take 1 tablet by mouth daily     Multiple Vitamin (MULTIVITAMIN) tablet Take 1 tablet by mouth daily.     mupirocin ointment (BACTROBAN) 2 % Place 1 application  into the nose 2 (two) times daily. 22 g 0   traZODone (DESYREL) 50 MG tablet Take 0.5-1 tablets (25-50 mg total) by mouth at bedtime as needed for sleep. 90 tablet 3   hydrALAZINE (APRESOLINE) 25 MG tablet Take 1 tablet by mouth once a day in the morning. (Patient not taking: Reported on 04/01/2019) 30 tablet 11   No current facility-administered medications on file prior to visit.    Allergies  Allergen Reactions   Penicillins Anaphylaxis, Itching, Swelling and Rash    Has  patient had a PCN reaction causing immediate rash, facial/tongue/throat swelling, SOB or lightheadedness with hypotension: Yes Has patient had a PCN reaction causing severe rash involving mucus membranes or skin necrosis: No Has patient had a PCN reaction that required hospitalization: Yes Has patient had a PCN reaction occurring within the last 10 years: No If all of the above answers are "NO", then may proceed with Cephalosporin use.    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[employer] resource strain: Not on file   Food insecurity    Worry: Not on file    Inability: Not on file   Transportation needs    Medical: Not on file    Non-medical: Not on file  Tobacco Use   Smoking status: Former Smoker    Packs/day: 0.50    Years: 40.00    Pack years: 20.00    Types: Cigarettes    Quit date: 02/13/2011    Years since quitting: 8.2   Smokeless tobacco: Never Used  Substance and Sexual Activity   Alcohol use: No   Drug use: No   Sexual activity: Not on file  Lifestyle   Physical activity    Days per week: Not on file    Minutes per session: Not on file   Stress: Not on file  Relationships   Social connections    Talks on phone: Not on file    Gets together: Not on file    Attends religious service: Not on file    Active member of club or organization: Not on file    Attends  meetings of clubs or organizations: Not on file    Relationship status: Not on file   Intimate partner violence    Fear of current or ex partner: Not on file    Emotionally abused: Not on file    Physically abused: Not on file    Forced sexual activity: Not on file  Other Topics Concern   Not on file  Social History Narrative   Not on file     Review of Systems  All other systems reviewed and are negative.      Objective:   Physical Exam Vitals signs reviewed.  Constitutional:      General: She is not in acute distress.  Appearance: She is well-developed. She is not diaphoretic.  Neck:     Musculoskeletal: Normal range of motion and neck supple. No neck rigidity or muscular tenderness.     Thyroid: No thyromegaly.     Vascular: No JVD.  Cardiovascular:     Rate and Rhythm: Normal rate and regular rhythm.     Heart sounds: Normal heart sounds. No murmur. No friction rub. No gallop.   Pulmonary:     Effort: Pulmonary effort is normal. No respiratory distress.     Breath sounds: Normal breath sounds. No stridor. No wheezing or rales.  Lymphadenopathy:     Cervical: No cervical adenopathy.  Neurological:     General: No focal deficit present.     Mental Status: She is alert and oriented to person, place, and time. Mental status is at baseline.     Cranial Nerves: No cranial nerve deficit.     Sensory: No sensory deficit.     Motor: Tremor present. No weakness, atrophy, abnormal muscle tone or seizure activity.     Coordination: Coordination normal.     Gait: Gait normal.     Deep Tendon Reflexes: Reflexes are normal and symmetric.          Assessment & Plan:  Essential tremor - Plan: Ambulatory referral to Neurology  Posterior neck pain  Per the patient's request I will consult neurology to discuss treatment for essential tremor.  We discussed primidone but the patient would like a second opinion.  Regarding the pain in her neck, I believe this is most likely  due to the facet arthritis seen on her MRI.  I see no indication for surgery and therefore I do not believe a neurosurgical evaluation is necessary at this time.  Initially I would try conservative strategies such as NSAIDs or physical therapy.  We will start using an NSAID meloxicam 15 mg a day and see if her symptoms improve.  I did caution the patient about potential GI toxicity.  Regarding the ENT referral, I see no visible abnormality inside her left nostril.  Therefore I do not feel that ENT is necessary.  The patient is okay with this.

## 2019-05-05 NOTE — Progress Notes (Signed)
[place]  [phone] [phone]   ID: Brittany Kirk DOB: 07/08/1945  MR#: [id number]  JYN#:[id number]  Patient Care Team: Susy Frizzle, MD as PCP - General (Family Medicine) Elsie Stain, MD (Pulmonary Disease) Shanaia Sievers, Virgie Dad, MD as Consulting Physician (Oncology) Thea Silversmith, MD as Consulting Physician (Radiation Oncology) Dian Queen, MD as Consulting Physician (Obstetrics and Gynecology) Clarene Essex, MD as Consulting Physician (Gastroenterology) OTHER MD: Griselda Miner M.D.  CHIEF COMPLAINT: Early stage estrogen receptor positive breast cancer  CURRENT TREATMENT: Completing 5 years of anastrozole   INTERVAL HISTORY: Brittany Kirk returns today for follow-up of her estrogen receptor positive breast cancer.   She continues on anastrozole she has had no significant problems from this medication.  In particular hot flashes vaginal dryness and arthralgias/myalgias have not been a problem.  Brittany Kirk's last bone density screening on 02/07/2015 at [facility], showed a T-score of -3.2, which is considered osteoporotic.  We have discussed pharmacologic intervention for this but she has been reluctant to proceed.  Since her last visit, she has not undergone any additional studies. Her most recent mammography was on 02/07/2018 at [facility].  She tells me that she has received a letter from the breast center and that she is going to be sure to call them back and schedule mammography before the end of the year   REVIEW OF SYSTEMS: Brittany Kirk is taking appropriate pandemic precautions.  She carefully weighed the pluses and minuses regarding visiting family in [place] for the holidays and she is decided against it because some family members are not very careful in her eyes.  She walks for exercise, very irregularly.  Her r functional status is average for her age.  A detailed review of systems today was otherwise noncontributory    BREAST CANCER HISTORY: From the earlier intake note:  Brittany Kirk underwent routine screening mammography August of 2015 showing a possible mass in the right breast. On 01/19/2014 at [facility] she underwent right diagnostic mammography and ultrasonography. The breast density was category B. The mass appeared spiculated on spot compression views and an ultrasound it was nearly isoechoic. It was located at the 10:00 position 4 cm from the nipple and measured 7.5 mm by ultrasonography. Ultrasound of the right axilla was negative.  On 01/26/2014 the patient underwent biopsy of the mass in question and this showed ([id number]) and invasive ductal carcinoma, grade 1, estrogen receptor 100% positive, progesterone receptor 60% positive, both with strong staining intensity, with an MIB-1 of 12%, and no HER-2 modification, the signals ratio being 1.38 and the number per cell 3.30.  On 02/19/2014 the patient underwent right lumpectomy. Sentinel lymph node sampling was attempted but failed and accordingly the patient received a full axillary lymph node dissection. The final pathology from this procedure (SZ [id number]. [id number]) showed an invasive ductal carcinoma, 1.4 cm, grade 1, with negative margins, and all 19 lymph nodes clear.  Her subsequent history is as detailed below   PAST MEDICAL HISTORY: Past Medical History:  Diagnosis Date  . Allergic rhinitis   . Anxiety   . Asymptomatic bilateral carotid artery stenosis    40-59% (02/2018)  . Breast cancer ([place]) 2015   Right Breast Cancer  . CAD (coronary artery disease)   . Cancer ([place])   . Depression   . Emphysema    no longer uses inhalers  . Gallstones    nausea, pain upper right abdomen  . GERD (gastroesophageal reflux disease)   .  H/O hiatal hernia   . History of skin cancer   . Hyperlipidemia   . Hypertension   . Multinodular goiter (nontoxic)   . Tobacco user   . Wears dentures    top    PAST SURGICAL HISTORY: Past Surgical  History:  Procedure Laterality Date  . BREAST LUMPECTOMY Right 2015  . CHOLECYSTECTOMY  07/08/2012   Procedure: LAPAROSCOPIC CHOLECYSTECTOMY WITH INTRAOPERATIVE CHOLANGIOGRAM;  Surgeon: Gayland Curry, MD,FACS;  [facility];  Service: General;  Laterality: N/A;  Laparoscopic Cholecystectomy with Intraoperative Cholangiogram  . COLONOSCOPY    . ESOPHAGOSCOPY N/A 05/29/2017   Procedure: ESOPHAGOSCOPY;  Surgeon: Clarene Essex, MD;  Location: [facility];  Service: Endoscopy;  Laterality: N/A;  botox injection  . HAND SURGERY Left    wrist  . LEFT HEART CATH AND CORONARY ANGIOGRAPHY N/A 10/11/2017   Procedure: LEFT HEART CATH AND CORONARY ANGIOGRAPHY;  Surgeon: Martinique, Peter M, MD;  Location: [facility] [place] [facility];  Service: Cardiovascular;  Laterality: N/A;  . TONSILECTOMY, ADENOIDECTOMY, BILATERAL MYRINGOTOMY AND TUBES    . TUBAL LIGATION      FAMILY HISTORY Family History  Problem Relation Age of Onset  . Arthritis Mother   . Heart disease Unknown        5 brothers and 2 sister  . Diverticulitis Sister   . Aplastic anemia Other    The patient's father died at the age of 94. The patient's mother died at the age of 57 from pancreatic cancer which had been diagnosed a year earlier. The patient had 6 brothers, 2 sisters. There is no history of breast or ovarian cancer in the family.   GYNECOLOGIC HISTORY:  No LMP recorded. Patient is postmenopausal. Menarche age 49, first live birth age 69. The patient is GX P1. She went through menopause at approximately age 6. She did not take hormone replacement.    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 She has a son from her first marriage, Brittany Kirk, who works in [place] as a Dealer. The patient has 2 biological grandchildren, including a 44 year old grandson who underwent bone marrow transplant at [facility] for aplastic anemia 03/25/2014. Brittany Kirk's second husband,  Brittany Kirk"), worked in Arboriculturist. He also has 2 grandchildren from an earlier marriage. The patient attends a local [place].    ADVANCED DIRECTIVES: Not in place   HEALTH MAINTENANCE: Social History   Tobacco Use  . Smoking status: Former Smoker    Packs/day: 0.50    Years: 40.00    Pack years: 20.00    Types: Cigarettes    Quit date: 02/13/2011    Years since quitting: 8.2  . Smokeless tobacco: Never Used  Substance Use Topics  . Alcohol use: No  . Drug use: No      Allergies  Allergen Reactions  . Penicillins Anaphylaxis, Itching, Swelling and Rash    Has patient had a PCN reaction causing immediate rash, facial/tongue/throat swelling, SOB or lightheadedness with hypotension: Yes Has patient had a PCN reaction causing severe rash involving mucus membranes or skin necrosis: No Has patient had a PCN reaction that required hospitalization: Yes Has patient had a PCN reaction occurring within the last 10 years: No If all of the above answers are "NO", then may proceed with Cephalosporin use.   . Cefaclor Itching, Swelling and Rash  . Latex Itching and Rash  . Metronidazole Itching, Swelling and Rash  . Naproxen  Itching, Swelling and Rash  . Nsaids Itching, Swelling and Rash    Ibuprofen IS tolerated  . Septra [Bactrim] Itching, Swelling and Rash  . Sulfonamide Derivatives Itching, Swelling and Rash  . Tape Itching and Rash    Current Outpatient Medications  Medication Sig Dispense Refill  . anastrozole (ARIMIDEX) 1 MG tablet Take 1 tablet (1 mg total) by mouth daily. 90 tablet 3  . aspirin 81 MG tablet Take 81 mg by mouth daily.    Marland Kitchen atorvastatin (LIPITOR) 40 MG tablet TAKE 1 TABLET BY MOUTH EVERYDAY AT BEDTIME 90 tablet 1  . Cyanocobalamin (B-12) 2500 MCG TABS Take 2,500 mcg by mouth daily.    . diazepam (VALIUM) 5 MG tablet Take 5 mg by mouth as needed.   3  . FLUoxetine (PROZAC) 20 MG capsule TAKE 1 CAPSULE BY MOUTH EVERY DAY 90 capsule 2   . hydrALAZINE (APRESOLINE) 25 MG tablet Take 1 tablet by mouth once a day in the morning. (Patient not taking: Reported on 04/01/2019) 30 tablet 11  . losartan (COZAAR) 100 MG tablet Take 1 tablet by mouth daily    . meloxicam (MOBIC) 15 MG tablet Take 1 tablet (15 mg total) by mouth daily. 30 tablet 0  . Multiple Vitamin (MULTIVITAMIN) tablet Take 1 tablet by mouth daily.    . mupirocin ointment (BACTROBAN) 2 % Place 1 application into the nose 2 (two) times daily. 22 g 0  . traZODone (DESYREL) 50 MG tablet Take 0.5-1 tablets (25-50 mg total) by mouth at bedtime as needed for sleep. 90 tablet 3   No current facility-administered medications for this visit.     OBJECTIVE: Middle-aged white woman who appears stated age  73:   05/06/19 1401  BP: (!) 149/63  Pulse: (!) 57  Resp: 17  Temp: 98.5 F (36.9 C)  SpO2: 100%     Body mass index is 21.48 kg/m.    ECOG FS:1 - Symptomatic but completely ambulatory  Sclerae unicteric, EOMs intact Wearing a mask No cervical or supraclavicular adenopathy Lungs no rales or rhonchi Heart regular rate and rhythm Abd soft, nontender, positive bowel sounds MSK mild kyphosis but no focal spinal tenderness, no upper extremity lymphedema Neuro: nonfocal, well oriented, appropriate affect Breasts: Status post right lumpectomy, with no evidence of disease recurrence.  The left breast is unremarkable.  Both axillae are benign.   LAB RESULTS:  CMP     Component Value Date/Time   NA 140 03/27/2019 1234   NA 139 04/29/2018 1456   NA 141 02/25/2017 1415   K 4.6 03/27/2019 1234   K 3.9 02/25/2017 1415   CL 105 03/27/2019 1234   CO2 25 03/27/2019 1234   CO2 26 02/25/2017 1415   GLUCOSE 98 03/27/2019 1234   GLUCOSE 110 02/25/2017 1415   BUN 20 03/27/2019 1234   BUN 15 04/29/2018 1456   BUN 14.1 02/25/2017 1415   CREATININE 0.80 03/27/2019 1234   CREATININE 1.0 02/25/2017 1415   CALCIUM 9.1 03/27/2019 1234   CALCIUM 9.2 02/25/2017 1415    PROT 5.9 (L) 03/27/2019 1234   PROT 6.2 02/27/2017 0940   PROT 6.5 02/25/2017 1415   ALBUMIN 3.6 07/02/2018 1221   ALBUMIN 3.9 02/27/2017 0940   ALBUMIN 3.5 02/25/2017 1415   AST 17 03/27/2019 1234   AST 14 (L) 07/02/2018 1221   AST 16 02/25/2017 1415   ALT 11 03/27/2019 1234   ALT 13 07/02/2018 1221   ALT 10 02/25/2017 1415   ALKPHOS 92  07/02/2018 1221   ALKPHOS 74 02/25/2017 1415   BILITOT 0.5 03/27/2019 1234   BILITOT 0.6 07/02/2018 1221   BILITOT 0.56 02/25/2017 1415   GFRNONAA 72 03/27/2019 1234   GFRAA 84 03/27/2019 1234    I No results found for: SPEP  Lab Results  Component Value Date   WBC 7.4 05/06/2019   NEUTROABS 4.6 05/06/2019   HGB 12.8 05/06/2019   HCT 39.6 05/06/2019   MCV 101.5 (H) 05/06/2019   PLT 218 05/06/2019      Chemistry      Component Value Date/Time   NA 140 03/27/2019 1234   NA 139 04/29/2018 1456   NA 141 02/25/2017 1415   K 4.6 03/27/2019 1234   K 3.9 02/25/2017 1415   CL 105 03/27/2019 1234   CO2 25 03/27/2019 1234   CO2 26 02/25/2017 1415   BUN 20 03/27/2019 1234   BUN 15 04/29/2018 1456   BUN 14.1 02/25/2017 1415   CREATININE 0.80 03/27/2019 1234   CREATININE 1.0 02/25/2017 1415      Component Value Date/Time   CALCIUM 9.1 03/27/2019 1234   CALCIUM 9.2 02/25/2017 1415   ALKPHOS 92 07/02/2018 1221   ALKPHOS 74 02/25/2017 1415   AST 17 03/27/2019 1234   AST 14 (L) 07/02/2018 1221   AST 16 02/25/2017 1415   ALT 11 03/27/2019 1234   ALT 13 07/02/2018 1221   ALT 10 02/25/2017 1415   BILITOT 0.5 03/27/2019 1234   BILITOT 0.6 07/02/2018 1221   BILITOT 0.56 02/25/2017 1415       No results found for: LABCA2  No components found for: LABCA125  No results for input(s): INR in the last 168 hours.  Urinalysis    Component Value Date/Time   COLORURINE YELLOW 12/24/2018 0946    STUDIES: No results found.   ASSESSMENT: 73 y.o. Brittany Kirk woman status post right upper outer quadrant lumpectomy and axillary lymph  node dissection 02/19/2014 for a pT1c pN0, stage IA invasive ductal carcinoma, grade 1, estrogen and progesterone receptor positive, HER-2 not amplified, with an MIB-1 of 12%   (1) Oncotype score of 18 predicts a risk of outside the breast recurrence of 11% if the patient's only systemic treatment is tamoxifen for 5 years. It also suggests no benefit from chemotherapy  (2) the patient opted against adjuvant radiation  (3) anastrozole started 04/18/2014, completing 5 years NOV 2020  (a) DEXA scan [facility] August 20 03/01/2015 showed a T score of -3.2 (osteoporosis)  (4)osteoporosis: opted against pharmacological interventions   PLAN: Avielle is now a little more than 5 years out from definitive surgery for her breast cancer, with no evidence of disease recurrence.  This is very favorable.  She has completed 5 years of anastrozole.  She tolerated this well.  There is no indication for her to continue beyond the basic 5years.  She is very pleased to be going off that medication.  She is a bit behind in her mammography but she assures me that she will call to have mammography scheduled at the breast center before the end of the year.  She is aware that she has significant osteoporosis.  She will continue to discuss that with her primary care physician but so far she has not been interested in receiving bisphosphonates or denosumab.  At this point I am comfortable releasing her to her primary care physician's care.  All she will need in terms of breast cancer follow-up is her yearly mammography and  a yearly physician breast exam.  I will be glad to see Maleeah again at any point in the future if and when the need arises but as of now are making no further routine appointments for her here.   Syra Sirmons, Virgie Dad, MD  05/07/19 9:03 AM Medical Oncology and Hematology [facility] [place], [place] [address] Tel. [phone]    Fax. [phone]    I, Wilburn Mylar, am acting as scribe for Dr. Virgie Dad. Rivan Siordia.  I, Lurline Del MD, have reviewed the above documentation for accuracy and completeness, and I agree with the above.

## 2019-05-06 ENCOUNTER — Other Ambulatory Visit: Payer: Self-pay

## 2019-05-06 ENCOUNTER — Inpatient Hospital Stay (HOSPITAL_BASED_OUTPATIENT_CLINIC_OR_DEPARTMENT_OTHER): Payer: Medicare Other | Admitting: Oncology

## 2019-05-06 ENCOUNTER — Inpatient Hospital Stay: Payer: Medicare Other | Attending: Oncology

## 2019-05-06 VITALS — BP 149/63 | HR 57 | Temp 98.5°F | Resp 17 | Ht 61.0 in | Wt 113.7 lb

## 2019-05-06 DIAGNOSIS — Z170 Estrogen receptor positive status [ER+]: Secondary | ICD-10-CM

## 2019-05-06 DIAGNOSIS — Z79811 Long term (current) use of aromatase inhibitors: Secondary | ICD-10-CM | POA: Insufficient documentation

## 2019-05-06 DIAGNOSIS — C50411 Malignant neoplasm of upper-outer quadrant of right female breast: Secondary | ICD-10-CM | POA: Insufficient documentation

## 2019-05-06 DIAGNOSIS — M810 Age-related osteoporosis without current pathological fracture: Secondary | ICD-10-CM | POA: Diagnosis not present

## 2019-05-06 DIAGNOSIS — I6523 Occlusion and stenosis of bilateral carotid arteries: Secondary | ICD-10-CM

## 2019-05-06 DIAGNOSIS — M818 Other osteoporosis without current pathological fracture: Secondary | ICD-10-CM | POA: Diagnosis not present

## 2019-05-06 LAB — CBC WITH DIFFERENTIAL/PLATELET
Abs Immature Granulocytes: 0.02 10*3/uL (ref 0.00–0.07)
Basophils Absolute: 0 10*3/uL (ref 0.0–0.1)
Basophils Relative: 0 %
Eosinophils Absolute: 0 10*3/uL (ref 0.0–0.5)
Eosinophils Relative: 0 %
HCT: 39.6 % (ref 36.0–46.0)
Hemoglobin: 12.8 g/dL (ref 12.0–15.0)
Immature Granulocytes: 0 %
Lymphocytes Relative: 30 %
Lymphs Abs: 2.2 10*3/uL (ref 0.7–4.0)
MCH: 32.8 pg (ref 26.0–34.0)
MCHC: 32.3 g/dL (ref 30.0–36.0)
MCV: 101.5 fL — ABNORMAL HIGH (ref 80.0–100.0)
Monocytes Absolute: 0.5 10*3/uL (ref 0.1–1.0)
Monocytes Relative: 6 %
Neutro Abs: 4.6 10*3/uL (ref 1.7–7.7)
Neutrophils Relative %: 64 %
Platelets: 218 10*3/uL (ref 150–400)
RBC: 3.9 MIL/uL (ref 3.87–5.11)
RDW: 13.4 % (ref 11.5–15.5)
WBC: 7.4 10*3/uL (ref 4.0–10.5)
nRBC: 0 % (ref 0.0–0.2)

## 2019-05-28 ENCOUNTER — Other Ambulatory Visit: Payer: Self-pay | Admitting: Family Medicine

## 2019-06-01 ENCOUNTER — Telehealth: Payer: Self-pay | Admitting: *Deleted

## 2019-06-01 ENCOUNTER — Institutional Professional Consult (permissible substitution): Payer: Medicare Other | Admitting: Diagnostic Neuroimaging

## 2019-06-01 NOTE — Telephone Encounter (Signed)
Called patient and informed her MD cannot come into office today. I advised he has openings tomorrow, and we rescheduled her. Patient verbalized understanding, appreciation.

## 2019-06-03 ENCOUNTER — Institutional Professional Consult (permissible substitution): Payer: Self-pay | Admitting: Diagnostic Neuroimaging

## 2019-06-17 DIAGNOSIS — K5901 Slow transit constipation: Secondary | ICD-10-CM | POA: Diagnosis not present

## 2019-06-17 DIAGNOSIS — R110 Nausea: Secondary | ICD-10-CM | POA: Diagnosis not present

## 2019-07-01 ENCOUNTER — Institutional Professional Consult (permissible substitution): Payer: Medicare Other | Admitting: Diagnostic Neuroimaging

## 2019-07-13 DIAGNOSIS — I4891 Unspecified atrial fibrillation: Secondary | ICD-10-CM

## 2019-07-13 HISTORY — DX: Unspecified atrial fibrillation: I48.91

## 2019-07-15 ENCOUNTER — Observation Stay (HOSPITAL_COMMUNITY)
Admission: EM | Admit: 2019-07-15 | Discharge: 2019-07-15 | Disposition: A | Discharge status: Home / Self Care | Payer: Medicare Other | Attending: Internal Medicine | Admitting: Internal Medicine

## 2019-07-15 ENCOUNTER — Ambulatory Visit (HOSPITAL_COMMUNITY): Payer: Medicare Other

## 2019-07-15 ENCOUNTER — Other Ambulatory Visit: Payer: Self-pay

## 2019-07-15 ENCOUNTER — Emergency Department (HOSPITAL_COMMUNITY): Payer: Medicare Other

## 2019-07-15 ENCOUNTER — Encounter (HOSPITAL_COMMUNITY): Payer: Self-pay | Admitting: Internal Medicine

## 2019-07-15 DIAGNOSIS — Z853 Personal history of malignant neoplasm of breast: Secondary | ICD-10-CM | POA: Diagnosis not present

## 2019-07-15 DIAGNOSIS — I499 Cardiac arrhythmia, unspecified: Secondary | ICD-10-CM | POA: Diagnosis not present

## 2019-07-15 DIAGNOSIS — F419 Anxiety disorder, unspecified: Secondary | ICD-10-CM | POA: Insufficient documentation

## 2019-07-15 DIAGNOSIS — Z03818 Encounter for observation for suspected exposure to other biological agents ruled out: Secondary | ICD-10-CM | POA: Diagnosis not present

## 2019-07-15 DIAGNOSIS — I2510 Atherosclerotic heart disease of native coronary artery without angina pectoris: Secondary | ICD-10-CM | POA: Diagnosis not present

## 2019-07-15 DIAGNOSIS — E042 Nontoxic multinodular goiter: Secondary | ICD-10-CM | POA: Diagnosis present

## 2019-07-15 DIAGNOSIS — Z886 Allergy status to analgesic agent status: Secondary | ICD-10-CM | POA: Diagnosis not present

## 2019-07-15 DIAGNOSIS — R0902 Hypoxemia: Secondary | ICD-10-CM | POA: Diagnosis not present

## 2019-07-15 DIAGNOSIS — Z791 Long term (current) use of non-steroidal anti-inflammatories (NSAID): Secondary | ICD-10-CM | POA: Diagnosis not present

## 2019-07-15 DIAGNOSIS — Z85828 Personal history of other malignant neoplasm of skin: Secondary | ICD-10-CM | POA: Insufficient documentation

## 2019-07-15 DIAGNOSIS — Z87892 Personal history of anaphylaxis: Secondary | ICD-10-CM | POA: Diagnosis not present

## 2019-07-15 DIAGNOSIS — Z79899 Other long term (current) drug therapy: Secondary | ICD-10-CM | POA: Insufficient documentation

## 2019-07-15 DIAGNOSIS — I10 Essential (primary) hypertension: Secondary | ICD-10-CM | POA: Diagnosis present

## 2019-07-15 DIAGNOSIS — R079 Chest pain, unspecified: Secondary | ICD-10-CM | POA: Diagnosis not present

## 2019-07-15 DIAGNOSIS — I4891 Unspecified atrial fibrillation: Secondary | ICD-10-CM | POA: Diagnosis not present

## 2019-07-15 DIAGNOSIS — R0789 Other chest pain: Secondary | ICD-10-CM | POA: Diagnosis not present

## 2019-07-15 DIAGNOSIS — J449 Chronic obstructive pulmonary disease, unspecified: Secondary | ICD-10-CM | POA: Diagnosis present

## 2019-07-15 DIAGNOSIS — Z880 Allergy status to penicillin: Secondary | ICD-10-CM | POA: Diagnosis not present

## 2019-07-15 DIAGNOSIS — Z888 Allergy status to other drugs, medicaments and biological substances status: Secondary | ICD-10-CM | POA: Insufficient documentation

## 2019-07-15 DIAGNOSIS — N182 Chronic kidney disease, stage 2 (mild): Secondary | ICD-10-CM | POA: Insufficient documentation

## 2019-07-15 DIAGNOSIS — R002 Palpitations: Secondary | ICD-10-CM | POA: Diagnosis not present

## 2019-07-15 DIAGNOSIS — I129 Hypertensive chronic kidney disease with stage 1 through stage 4 chronic kidney disease, or unspecified chronic kidney disease: Secondary | ICD-10-CM | POA: Diagnosis not present

## 2019-07-15 DIAGNOSIS — M810 Age-related osteoporosis without current pathological fracture: Secondary | ICD-10-CM | POA: Diagnosis not present

## 2019-07-15 DIAGNOSIS — I6523 Occlusion and stenosis of bilateral carotid arteries: Secondary | ICD-10-CM | POA: Diagnosis present

## 2019-07-15 DIAGNOSIS — F329 Major depressive disorder, single episode, unspecified: Secondary | ICD-10-CM | POA: Diagnosis not present

## 2019-07-15 DIAGNOSIS — Z87891 Personal history of nicotine dependence: Secondary | ICD-10-CM | POA: Insufficient documentation

## 2019-07-15 DIAGNOSIS — Z881 Allergy status to other antibiotic agents status: Secondary | ICD-10-CM | POA: Diagnosis not present

## 2019-07-15 DIAGNOSIS — Z8249 Family history of ischemic heart disease and other diseases of the circulatory system: Secondary | ICD-10-CM | POA: Insufficient documentation

## 2019-07-15 DIAGNOSIS — Z20822 Contact with and (suspected) exposure to covid-19: Secondary | ICD-10-CM | POA: Insufficient documentation

## 2019-07-15 DIAGNOSIS — E785 Hyperlipidemia, unspecified: Secondary | ICD-10-CM | POA: Diagnosis not present

## 2019-07-15 DIAGNOSIS — Z882 Allergy status to sulfonamides status: Secondary | ICD-10-CM | POA: Diagnosis not present

## 2019-07-15 DIAGNOSIS — Z7982 Long term (current) use of aspirin: Secondary | ICD-10-CM | POA: Insufficient documentation

## 2019-07-15 DIAGNOSIS — K219 Gastro-esophageal reflux disease without esophagitis: Secondary | ICD-10-CM | POA: Insufficient documentation

## 2019-07-15 HISTORY — DX: Diaphragmatic hernia without obstruction or gangrene: K44.9

## 2019-07-15 HISTORY — DX: Scoliosis, unspecified: M41.9

## 2019-07-15 LAB — HEMOGLOBIN A1C
Hgb A1c MFr Bld: 5.3 % (ref 4.8–5.6)
Mean Plasma Glucose: 105.41 mg/dL

## 2019-07-15 LAB — APTT: aPTT: 25 seconds (ref 24–36)

## 2019-07-15 LAB — RESPIRATORY PANEL BY RT PCR (FLU A&B, COVID)
Influenza A by PCR: NEGATIVE
Influenza B by PCR: NEGATIVE
SARS Coronavirus 2 by RT PCR: NEGATIVE

## 2019-07-15 LAB — BASIC METABOLIC PANEL
Anion gap: 9 (ref 5–15)
BUN: 20 mg/dL (ref 8–23)
CO2: 25 mmol/L (ref 22–32)
Calcium: 8.4 mg/dL — ABNORMAL LOW (ref 8.9–10.3)
Chloride: 108 mmol/L (ref 98–111)
Creatinine, Ser: 0.76 mg/dL (ref 0.44–1.00)
GFR calc Af Amer: >60 mL/min (ref >60)
GFR calc non Af Amer: >60 mL/min (ref >60)
Glucose, Bld: 109 mg/dL — ABNORMAL HIGH (ref 70–99)
Potassium: 3.6 mmol/L (ref 3.5–5.1)
Sodium: 142 mmol/L (ref 135–145)

## 2019-07-15 LAB — CBC WITH DIFFERENTIAL/PLATELET
Abs Immature Granulocytes: 0.02 10*3/uL (ref 0.00–0.07)
Basophils Absolute: 0 10*3/uL (ref 0.0–0.1)
Basophils Relative: 0 %
Eosinophils Absolute: 0.1 10*3/uL (ref 0.0–0.5)
Eosinophils Relative: 1 %
HCT: 36.7 % (ref 36.0–46.0)
Hemoglobin: 11.8 g/dL — ABNORMAL LOW (ref 12.0–15.0)
Immature Granulocytes: 0 %
Lymphocytes Relative: 32 %
Lymphs Abs: 2.1 10*3/uL (ref 0.7–4.0)
MCH: 33.1 pg (ref 26.0–34.0)
MCHC: 32.2 g/dL (ref 30.0–36.0)
MCV: 102.8 fL — ABNORMAL HIGH (ref 80.0–100.0)
Monocytes Absolute: 0.4 10*3/uL (ref 0.1–1.0)
Monocytes Relative: 6 %
Neutro Abs: 4 10*3/uL (ref 1.7–7.7)
Neutrophils Relative %: 61 %
Platelets: 169 10*3/uL (ref 150–400)
RBC: 3.57 MIL/uL — ABNORMAL LOW (ref 3.87–5.11)
RDW: 13.2 % (ref 11.5–15.5)
WBC: 6.6 10*3/uL (ref 4.0–10.5)
nRBC: 0 % (ref 0.0–0.2)

## 2019-07-15 LAB — LIPID PANEL
Cholesterol: 133 mg/dL (ref 0–200)
HDL: 59 mg/dL (ref >40)
LDL Cholesterol: 64 mg/dL (ref 0–99)
Total CHOL/HDL Ratio: 2.3 RATIO
Triglycerides: 48 mg/dL (ref <150)
VLDL: 10 mg/dL (ref 0–40)

## 2019-07-15 LAB — PROTIME-INR
INR: 1.1 (ref 0.8–1.2)
Prothrombin Time: 14.1 seconds (ref 11.4–15.2)

## 2019-07-15 LAB — TROPONIN I (HIGH SENSITIVITY)
Troponin I (High Sensitivity): 18 ng/L — ABNORMAL HIGH (ref <18)
Troponin I (High Sensitivity): 69 ng/L — ABNORMAL HIGH (ref <18)
Troponin I (High Sensitivity): 63 ng/L — ABNORMAL HIGH (ref <18)

## 2019-07-15 LAB — MAGNESIUM: Magnesium: 2.2 mg/dL (ref 1.7–2.4)

## 2019-07-15 LAB — BRAIN NATRIURETIC PEPTIDE: B Natriuretic Peptide: 117.2 pg/mL — ABNORMAL HIGH (ref 0.0–100.0)

## 2019-07-15 LAB — TSH: TSH: 0.777 u[IU]/mL (ref 0.350–4.500)

## 2019-07-15 MED ORDER — APIXABAN 5 MG PO TABS
5.0000 mg | ORAL_TABLET | Freq: Two times a day (BID) | ORAL | Status: DC
  Filled 2019-07-15: qty 1

## 2019-07-15 MED ORDER — ATORVASTATIN CALCIUM 40 MG PO TABS: 40.0000 mg | ORAL_TABLET | Freq: Every day | ORAL | Status: DC

## 2019-07-15 MED ORDER — PANTOPRAZOLE SODIUM 40 MG PO TBEC: 40.0000 mg | DELAYED_RELEASE_TABLET | Freq: Every day | ORAL | Status: DC

## 2019-07-15 MED ORDER — HEPARIN (PORCINE) 25000 UT/250ML-% IV SOLN
750.0000 [IU]/h | INTRAVENOUS | Status: DC
  Administered 2019-07-15: 09:00:00 750 [IU]/h via INTRAVENOUS

## 2019-07-15 MED ORDER — HEPARIN BOLUS VIA INFUSION
2000.0000 [IU] | Freq: Once | INTRAVENOUS | Status: AC
  Administered 2019-07-15: 03:00:00 2000 [IU] via INTRAVENOUS
  Filled 2019-07-15: qty 2000

## 2019-07-15 MED ORDER — METOPROLOL TARTRATE 25 MG PO TABS: 12.5000 mg | ORAL_TABLET | Freq: Two times a day (BID) | ORAL | Status: DC

## 2019-07-15 MED ORDER — ACETAMINOPHEN 325 MG PO TABS: 650.0000 mg | ORAL_TABLET | ORAL | Status: DC | PRN

## 2019-07-15 MED ORDER — DIAZEPAM 5 MG PO TABS: 5.0000 mg | ORAL_TABLET | Freq: Every day | ORAL | Status: DC | PRN

## 2019-07-15 MED ORDER — APIXABAN 5 MG PO TABS: 5.0000 mg | ORAL_TABLET | Freq: Two times a day (BID) | ORAL | 0 refills | Status: DC

## 2019-07-15 MED ORDER — TRAZODONE HCL 50 MG PO TABS: 25.0000 mg | ORAL_TABLET | Freq: Every evening | ORAL | Status: DC | PRN

## 2019-07-15 MED ORDER — ONDANSETRON HCL 4 MG/2ML IJ SOLN: 4.0000 mg | Freq: Four times a day (QID) | INTRAMUSCULAR | Status: DC | PRN

## 2019-07-15 MED ORDER — METOPROLOL TARTRATE 25 MG PO TABS: 12.5000 mg | ORAL_TABLET | Freq: Two times a day (BID) | ORAL | 0 refills | Status: DC

## 2019-07-15 MED ORDER — HEPARIN (PORCINE) 25000 UT/250ML-% IV SOLN
750.0000 [IU]/h | INTRAVENOUS | Status: DC
  Administered 2019-07-15: 03:00:00 750 [IU]/h via INTRAVENOUS
  Filled 2019-07-15: qty 250

## 2019-07-15 MED ORDER — METOPROLOL TARTRATE 5 MG/5ML IV SOLN
5.0000 mg | Freq: Once | INTRAVENOUS | Status: AC
  Administered 2019-07-15: 03:00:00 5 mg via INTRAVENOUS
  Filled 2019-07-15: qty 5

## 2019-07-15 NOTE — Consult Note (Signed)
Cardiology Consultation:   Patient ID: Brittany Kirk MRN: [id number]; DOB: [birth year]  Admit date: 07/15/2019 Date of Consult: 07/15/2019  Primary Care Provider: Susy Frizzle, MD Primary Cardiologist: [name], MD  Primary Electrophysiologist:  None    Patient Profile:   Brittany Kirk is a 74 y.o. female with a hx of hypertension, strong family history of heart disease, previous tobacco abuse, breast cancer, GERD, and carotid artery disease who is being seen today for the evaluation of new onset atrial fibrillation RVR at the request of Dr Lorin Mercy.  History of Present Illness:   Brittany Kirk is followed by Dr. Gwenlyn Found.  She had a remote catheterization by Dr. Lia Foyer but no intervention was performed. She had a negative stress test 01/24/2016 and again during hospitalization for chest pain 01/05/2017. She has been on PPI for acid reflux.  In May 2019 she was seen in the office for progressive chest pain and was quickly taken to the Cath Lab.  Catheterization showed single-vessel obstructive CAD involving ostium of the ramus intermediate branch, normal LV function, normal LVEDP.  No PCI was performed. Cath was unchanged from prior in 2010 in which ramus branch was untreated at that time due to concern about shifting plaque into the LAD or circumflex with PCI. Pressures have been intermittently high and was restarted losartan and hydralazine.  The patient was last seen in a telemetry visit 04/01/2019 by Dr. Gwenlyn Found.  She reported she had discontinued hydralazine and losartan due to symptomatic hypotension. carotid Dopplers on 02/19/2019 showed moderate to severe right internal carotid artery stenosis and moderate left.  Pressures were stable at that time.  The patient presented to the ED 07/15/2019 for chest pain and palpitations.  Reported chest discomfort started around 11:30 PM.  Initially thought it was indigestion which she has had for many years but didn't feel the burning sensation. She  checked her heart rate which was around 103 and very abnormal for her. She started to feel pain in both of her arms as well. She also noticed a fluttering sensation in her heart that didn't go away. No shortness of breath, nausea, vomiting, diaphoresis, lightheadedness, or dizziness. She checked her heart rate and it was around 110 (baseline around 50-60s) so she called EMS reported she was found to be in A. fib RVR.   In the ED patient was afebrile, blood pressure 109/62.  Rates were on 100-140.  She was given IV Lopressor with improvement of rate continued to be in A. fib.  IV heparin was started. HS troponin 18 > 69>63.  Potassium 3.6, creatinine 0.76.  BNP 117.  WBC 6.6, hemoglobin 11.8.  Rapid respiratory panel was negative.  Chest x-ray with no acute abnormality.  Patient was admitted for further work-up  Patient quit smoking about 5-7 years ago. She drinks alcohol about once a year. She drinks 3/4 cup of coffee daily. She denies recent swelling in her legs. Since she has been in the ED her rates have been 70-80s. She had intermittent fluttering now. No chest discomfort.   Heart Pathway Score:     Past Medical History:  Diagnosis Date  . Allergic rhinitis   . Anxiety   . Asymptomatic bilateral carotid artery stenosis    40-59% (02/2018)  . Atrial fibrillation ([place]) 07/2019  . Breast cancer ([place]) 2015   Right Breast Cancer  . CAD (coronary artery disease)   . Depression   . Emphysema    no longer uses inhalers  . Gallstones  nausea, pain upper right abdomen  . GERD (gastroesophageal reflux disease)   . H/O hiatal hernia   . Hiatal hernia   . History of skin cancer   . Hyperlipidemia   . Hypertension   . Multinodular goiter (nontoxic)   . Scoliosis   . Tobacco user   . Wears dentures    top    Past Surgical History:  Procedure Laterality Date  . BREAST LUMPECTOMY Right 2015  . CHOLECYSTECTOMY  07/08/2012   Procedure: LAPAROSCOPIC CHOLECYSTECTOMY WITH INTRAOPERATIVE  CHOLANGIOGRAM;  Surgeon: Gayland Curry, MD,FACS;  Location: [facility];  Service: General;  Laterality: N/A;  Laparoscopic Cholecystectomy with Intraoperative Cholangiogram  . COLONOSCOPY    . ESOPHAGOSCOPY N/A 05/29/2017   Procedure: ESOPHAGOSCOPY;  Surgeon: Clarene Essex, MD;  Location: [facility];  Service: Endoscopy;  Laterality: N/A;  botox injection  . HAND SURGERY Left    wrist  . LEFT HEART CATH AND CORONARY ANGIOGRAPHY N/A 10/11/2017   Procedure: LEFT HEART CATH AND CORONARY ANGIOGRAPHY;  Surgeon: Martinique, Peter M, MD;  Location: [facility];  Service: Cardiovascular;  Laterality: N/A;  . TONSILECTOMY, ADENOIDECTOMY, BILATERAL MYRINGOTOMY AND TUBES    . TUBAL LIGATION       Home Medications:  Prior to Admission medications   Medication Sig Start Date End Date Taking? Authorizing Provider  ascorbic acid (VITAMIN C) 500 MG tablet Take 500 mg by mouth daily.   Yes [provider]  aspirin 81 MG tablet Take 81 mg by mouth at bedtime.    Yes [provider]  atorvastatin (LIPITOR) 40 MG tablet TAKE 1 TABLET BY MOUTH EVERYDAY AT BEDTIME Patient taking differently: Take 40 mg by mouth at bedtime.  12/01/18  Yes Susy Frizzle, MD  cholecalciferol (VITAMIN D3) 25 MCG (1000 UNIT) tablet Take 1,000 Units by mouth daily.   Yes [provider]  Cyanocobalamin (B-12) 2500 MCG TABS Take 2,500 mcg by mouth daily.   Yes [provider]  diazepam (VALIUM) 5 MG tablet Take 5 mg by mouth daily as needed for anxiety.  07/19/15  Yes [provider]  omeprazole (PRILOSEC) 20 MG capsule Take 20 mg by mouth daily. 07/06/19  Yes [provider]  traZODone (DESYREL) 50 MG tablet Take 0.5-1 tablets (25-50 mg total) by mouth at bedtime as needed for sleep. 09/03/18  Yes Susy Frizzle, MD  zinc gluconate 50 MG tablet Take 50 mg by mouth daily.   Yes [provider]    Inpatient Medications: Scheduled Meds: . atorvastatin  40 mg Oral QHS  .  pantoprazole  40 mg Oral Daily   Continuous Infusions: . heparin     PRN Meds: acetaminophen, diazepam, ondansetron (ZOFRAN) IV, traZODone  Allergies:    Allergies  Allergen Reactions  . Penicillins Anaphylaxis, Itching, Swelling and Rash    Has patient had a PCN reaction causing immediate rash, facial/tongue/throat swelling, SOB or lightheadedness with hypotension: Yes Has patient had a PCN reaction causing severe rash involving mucus membranes or skin necrosis: No Has patient had a PCN reaction that required hospitalization: Yes Has patient had a PCN reaction occurring within the last 10 years: No If all of the above answers are "NO", then may proceed with Cephalosporin use.   . Cefaclor Itching, Swelling and Rash  . Latex Itching and Rash  . Metronidazole Itching, Swelling and Rash  . Naproxen Itching, Swelling and Rash  . Nsaids Itching, Swelling and Rash    Ibuprofen IS tolerated  . Septra [  Bactrim] Itching, Swelling and Rash  . Sulfonamide Derivatives Itching, Swelling and Rash  . Tape Itching and Rash    Social History:   Social History   Socioeconomic History  . Marital status: Married    Spouse name: Not on file  . Number of children: 1  . Years of education: Not on file  . Highest education level: Not on file  Occupational History  . Occupation: retired Clinical cytogeneticist: RETIRED  Tobacco Use  . Smoking status: Former Smoker    Packs/day: 0.50    Years: 40.00    Pack years: 20.00    Types: Cigarettes    Quit date: 02/13/2011    Years since quitting: 8.4  . Smokeless tobacco: Never Used  Substance and Sexual Activity  . Alcohol use: No  . Drug use: No  . Sexual activity: Not on file  Other Topics Concern  . Not on file  Social History Narrative  . Not on file   Social Determinants of Health   Financial Resource Strain:   . Difficulty of Paying Living Expenses: Not on file  Food Insecurity:   . Worried About Charity fundraiser in  the Last Year: Not on file  . Ran Out of Food in the Last Year: Not on file  Transportation Needs:   . Lack of Transportation (Medical): Not on file  . Lack of Transportation (Non-Medical): Not on file  Physical Activity:   . Days of Exercise per Week: Not on file  . Minutes of Exercise per Session: Not on file  Stress:   . Feeling of Stress : Not on file  Social Connections:   . Frequency of Communication with Friends and Family: Not on file  . Frequency of Social Gatherings with Friends and Family: Not on file  . Attends Religious Services: Not on file  . Active Member of Clubs or Organizations: Not on file  . Attends Archivist Meetings: Not on file  . Marital Status: Not on file  Intimate Partner Violence:   . Fear of Current or Ex-Partner: Not on file  . Emotionally Abused: Not on file  . Physically Abused: Not on file  . Sexually Abused: Not on file    Family History:   Family History  Problem Relation Age of Onset  . Arthritis Mother   . Heart disease Other        5 brothers and 2 sister  . Diverticulitis Sister   . Aplastic anemia Other      ROS:  Please see the history of present illness.  All other ROS reviewed and negative.     Physical Exam/Data:   Vitals:   07/15/19 0730 07/15/19 0745 07/15/19 0800 07/15/19 0815  BP: 128/63 136/67 137/78 136/63  Pulse: 80 82 (!) 123 (!) 113  Resp: (!) 23 19 (!) 26 (!) 21  Temp:      TempSrc:      SpO2: 98% 98% 99% 100%  Weight:      Height:        Intake/Output Summary (Last 24 hours) at 07/15/2019 0848 Last data filed at 07/15/2019 0816 Gross per 24 hour  Intake --  Output 650 ml  Net -650 ml   Last 3 Weights 07/15/2019 05/06/2019 04/27/2019  Weight (lbs) 115 lb 113 lb 11.2 oz 115 lb  Weight (kg) 52.164 kg 51.574 kg 52.164 kg     Body mass index is 23.23 kg/m.  General:  Well nourished,  well developed, in no acute distress HEENT: normal Lymph: no adenopathy Neck: no JVD Endocrine:  No  thryomegaly Vascular: No carotid bruits; FA pulses 2+ bilaterally without bruits  Cardiac:  normal S1, S2; Irreg Irreg; no murmur  Lungs:  clear to auscultation bilaterally, no wheezing, rhonchi or rales  Abd: soft, nontender, no hepatomegaly  Ext: no edema Musculoskeletal:  No deformities, BUE and BLE strength normal and equal Skin: warm and dry  Neuro:  CNs 2-12 intact, no focal abnormalities noted Psych:  Normal affect   EKG:  The EKG was personally reviewed and demonstrates:  Afib RVR, HR 103, minimal ST depression lateral leads Telemetry:  Telemetry was personally reviewed and demonstrates:  Afib Hr slowly improving from 110 to now around the 80s; rare PVCs  Relevant CV Studies: Cardiac cath 10/11/2017  Ost LAD lesion is 30% stenosed.  Prox LAD lesion is 40% stenosed.  Ost Ramus lesion is 85% stenosed.  Prox RCA to Mid RCA lesion is 35% stenosed.  The left ventricular systolic function is normal.  LV end diastolic pressure is normal.  The left ventricular ejection fraction is 55-65% by visual estimate.   1. Single vessel obstructive CAD involving the ostium of the ramus intermediate branch. 2. Normal LV function 3. Normal LVEDP  Plan: Cardiac cath is unchanged from prior study in 2010. The Ramus branch was untreated at that time due to concern about shifting plaque into the LAD or LCx with PCI. The patient is currently on no antianginal therapy and is severely hypertensive in the lab. I would recommend aggressive medical therapy and BP control. Will add Coreg and long acting nitrate. If she continues to have refractory angina despite optimal medical therapy and BP control, PCI of the ramus could be considered.   Coronary Diagrams  Diagnostic Dominance: Right      Myoview stress test 01/05/2017  CLinically negative for ischemia  No EKG changes with infusion  LVEF 83%  Low risk scan  Laboratory Data:  High Sensitivity Troponin:   Recent Labs  Lab  07/15/19 0217 07/15/19 0527  TROPONINIHS 18* 69*     Chemistry Recent Labs  Lab 07/15/19 0217  NA 142  K 3.6  CL 108  CO2 25  GLUCOSE 109*  BUN 20  CREATININE 0.76  CALCIUM 8.4*  GFRNONAA >60  GFRAA >60  ANIONGAP 9    No results for input(s): PROT, ALBUMIN, AST, ALT, ALKPHOS, BILITOT in the last 168 hours. Hematology Recent Labs  Lab 07/15/19 0217  WBC 6.6  RBC 3.57*  HGB 11.8*  HCT 36.7  MCV 102.8*  MCH 33.1  MCHC 32.2  RDW 13.2  PLT 169   BNP Recent Labs  Lab 07/15/19 0217  BNP 117.2*    DDimer No results for input(s): DDIMER in the last 168 hours.   Radiology/Studies:  DG Chest Port 1 View  Result Date: 07/15/2019 CLINICAL DATA:  Chest pain and tachycardia EXAM: PORTABLE CHEST 1 VIEW COMPARISON:  10/11/2017 FINDINGS: Cardiac shadow is stable. The lungs are well aerated bilaterally. No focal infiltrate or sizable effusion is seen. Aortic calcifications are noted. Postsurgical changes in the right axilla are seen. No bony abnormality is noted. IMPRESSION: No acute abnormality noted. Electronically Signed   By: Inez Catalina M.D.   On: 07/15/2019 02:59     Assessment and Plan:   New onset A. fib RVR -Patient was brought to the ED found to be in A. fib RVR with rates 100-140s. administered IV Lopressor with reduction  of rate -K 3.6, goal > 4 supplement -Check Mg and TSH  -CHA2DS2-VASc = 4 (female, hypertension, age x 2, PAD) -Started on IV heparin>>plan to transition to Eliquis 5 mg twice daily -Check EKG this morning - echo ordered -Patient remained in afib with controlled rates in the 70 to 80s not on rate controlling medication. During interview with the MD patient spontaneously converted to NSR. - Can consider discharge with Eliquis and low dose metoprolol.   Hypertension -Patient is not on medication at baseline - pressures are reasonable  Hyperlipidemia -At home patient takes Lipitor 40 mg daily - LDL 41 in 10/2008  CAD -Last cath in 2019  showed ostial ramus branch disease but otherwise no significant change from previous cath in 2019 -She has not had significant chest pain or shortness of breath - HS troponin 18 > 69>63.  Trend is flat and not consistent with ACS. This is likely demand ischemia in setting of A. fib RVR. -EKG with no ischemic changes - Can consider continuing Aspirin given ostial ramus disease. Will discuss with MD  For questions or updates, please contact [place] Please consult [email or web address] for contact info under     Signed, Analeese Andreatta Ninfa Meeker, PA-C  07/15/2019 8:48 AM

## 2019-07-15 NOTE — ED Triage Notes (Addendum)
Came in with [facility], reportedly from home complained of chest pain, and felt her heart was racing. Initial EKG suggests Afib fluctuating from 80's-140's BPM. Has no cardiac hx. Per EMS, patient had 2 nitro en route to the hospital and 500 ml of fluids.

## 2019-07-15 NOTE — Progress Notes (Signed)
ANTICOAGULATION CONSULT NOTE - Initial Consult  Pharmacy Consult for Heparin Indication: atrial fibrillation  Allergies  Allergen Reactions  . Penicillins Anaphylaxis, Itching, Swelling and Rash    Has patient had a PCN reaction causing immediate rash, facial/tongue/throat swelling, SOB or lightheadedness with hypotension: Yes Has patient had a PCN reaction causing severe rash involving mucus membranes or skin necrosis: No Has patient had a PCN reaction that required hospitalization: Yes Has patient had a PCN reaction occurring within the last 10 years: No If all of the above answers are "NO", then may proceed with Cephalosporin use.   . Cefaclor Itching, Swelling and Rash  . Latex Itching and Rash  . Metronidazole Itching, Swelling and Rash  . Naproxen Itching, Swelling and Rash  . Nsaids Itching, Swelling and Rash    Ibuprofen IS tolerated  . Septra [Bactrim] Itching, Swelling and Rash  . Sulfonamide Derivatives Itching, Swelling and Rash  . Tape Itching and Rash    Patient Measurements: Height: 4\' 11"  (149.9 cm) Weight: 115 lb (52.2 kg) IBW/kg (Calculated) : 43.2  Vital Signs: Temp: 97.8 F (36.6 C) (02/03 0223) Temp Source: Oral (02/03 0223)  Labs: No results for input(s): HGB, HCT, PLT, APTT, LABPROT, INR, HEPARINUNFRC, HEPRLOWMOCWT, CREATININE, CKTOTAL, CKMB, TROPONINIHS in the last 72 hours.  CrCl cannot be calculated (Patient's most recent lab result is older than the maximum 21 days allowed.).   Medical History: Past Medical History:  Diagnosis Date  . Allergic rhinitis   . Anxiety   . Asymptomatic bilateral carotid artery stenosis    40-59% (02/2018)  . Breast cancer ([place]) 2015   Right Breast Cancer  . CAD (coronary artery disease)   . Cancer ([place])   . Depression   . Emphysema    no longer uses inhalers  . Gallstones    nausea, pain upper right abdomen  . GERD (gastroesophageal reflux disease)   . H/O hiatal hernia   . History of skin cancer   .  Hyperlipidemia   . Hypertension   . Multinodular goiter (nontoxic)   . Tobacco user   . Wears dentures    top    Medications:  No current facility-administered medications on file prior to encounter.   Current Outpatient Medications on File Prior to Encounter  Medication Sig Dispense Refill  . aspirin 81 MG tablet Take 81 mg by mouth daily.    Marland Kitchen atorvastatin (LIPITOR) 40 MG tablet TAKE 1 TABLET BY MOUTH EVERYDAY AT BEDTIME 90 tablet 1  . Cyanocobalamin (B-12) 2500 MCG TABS Take 2,500 mcg by mouth daily.    . diazepam (VALIUM) 5 MG tablet Take 5 mg by mouth as needed.   3  . FLUoxetine (PROZAC) 20 MG capsule TAKE 1 CAPSULE BY MOUTH EVERY DAY 90 capsule 2  . hydrALAZINE (APRESOLINE) 25 MG tablet Take 1 tablet by mouth once a day in the morning. (Patient not taking: Reported on 04/01/2019) 30 tablet 11  . meloxicam (MOBIC) 15 MG tablet TAKE 1 TABLET BY MOUTH EVERY DAY 30 tablet 3  . Multiple Vitamin (MULTIVITAMIN) tablet Take 1 tablet by mouth daily.    . traZODone (DESYREL) 50 MG tablet Take 0.5-1 tablets (25-50 mg total) by mouth at bedtime as needed for sleep. 90 tablet 3     Assessment: 74 y.o. female with Afib for heparin Goal of Therapy:  Heparin level 0.3-0.7 units/ml Monitor platelets by anticoagulation protocol: Yes   Plan:  Heparin 2000 units IV bolus, then start heparin 750 units/hr Check heparin level  in 8 hours.    Jnai Snellgrove, Bronson Curb 07/15/2019,2:38 AM

## 2019-07-15 NOTE — Discharge Instructions (Signed)
Please return for a repeat episode. Follow up with the cardiologist as recommended.

## 2019-07-15 NOTE — ED Provider Notes (Signed)
[facility] EMERGENCY DEPARTMENT Provider Note   CSN: [id number] Arrival date & time: 07/15/19  0210     History Chief Complaint  Patient presents with  . Chest Pain  . Palpitations    Brittany Kirk is a 74 y.o. female.  Patient presents to the emergency department for evaluation of chest pain.  Patient reports that she had onset of discomfort in her chest around 11:30 PM.  She initially thought that it was indigestion.  Around 12:30 AM she started to feel pain in both of her arms and left shoulder.  Chest pain was worsening and she started to feel like her heart was racing.  She is brought to the ER by EMS.  She has been in atrial fibrillation with a rapid ventricular response during transport.        Past Medical History:  Diagnosis Date  . Allergic rhinitis   . Anxiety   . Asymptomatic bilateral carotid artery stenosis    40-59% (02/2018)  . Breast cancer ([place]) 2015   Right Breast Cancer  . CAD (coronary artery disease)   . Cancer ([place])   . Depression   . Emphysema    no longer uses inhalers  . Gallstones    nausea, pain upper right abdomen  . GERD (gastroesophageal reflux disease)   . H/O hiatal hernia   . History of skin cancer   . Hyperlipidemia   . Hypertension   . Multinodular goiter (nontoxic)   . Tobacco user   . Wears dentures    top    Patient Active Problem List   Diagnosis Date Noted  . Multinodular goiter (nontoxic)   . Asymptomatic bilateral carotid artery stenosis   . History of depression 02/14/2018  . History of breast cancer 02/14/2018  . Accelerated hypertension 02/14/2018  . Unstable angina ([place]) 10/11/2017  . Caloric malnutrition ([place]) 08/16/2017  . Weight loss, unintentional 03/12/2017  . GERD (gastroesophageal reflux disease) 08/10/2016  . Cough 08/10/2016  . Acute bronchitis 07/12/2016  . Bilateral carotid artery disease ([place]) 02/14/2016  . Chest pain 01/13/2016  . Osteoporosis 02/22/2015  . CKD (chronic  kidney disease), stage III 08/16/2014  . Malignant neoplasm of upper-outer quadrant of right breast in female, estrogen receptor positive ([place]) 03/22/2014  . Dizziness and giddiness 11/27/2012  . Essential tremor 11/27/2012  . Numbness 11/27/2012  . TOBACCO USER 10/04/2009  . CLOSED FRACTURE OF ONE RIB 07/12/2009  . Dyslipidemia, goal LDL below 70 09/28/2008  . CAD in native artery 09/28/2008  . Rhinitis 09/09/2008  . COPD (chronic obstructive pulmonary disease) ([place]) 09/07/2008  . Essential hypertension 09/06/2008  . SKIN CANCER, HX OF 09/06/2008    Past Surgical History:  Procedure Laterality Date  . BREAST LUMPECTOMY Right 2015  . CHOLECYSTECTOMY  07/08/2012   Procedure: LAPAROSCOPIC CHOLECYSTECTOMY WITH INTRAOPERATIVE CHOLANGIOGRAM;  Surgeon: Gayland Curry, MD,FACS;  Location: [facility];  Service: General;  Laterality: N/A;  Laparoscopic Cholecystectomy with Intraoperative Cholangiogram  . COLONOSCOPY    . ESOPHAGOSCOPY N/A 05/29/2017   Procedure: ESOPHAGOSCOPY;  Surgeon: Clarene Essex, MD;  Location: [facility];  Service: Endoscopy;  Laterality: N/A;  botox injection  . HAND SURGERY Left    wrist  . LEFT HEART CATH AND CORONARY ANGIOGRAPHY N/A 10/11/2017   Procedure: LEFT HEART CATH AND CORONARY ANGIOGRAPHY;  Surgeon: Martinique, Peter M, MD;  Location: [facility];  Service: Cardiovascular;  Laterality: N/A;  . TONSILECTOMY, ADENOIDECTOMY, BILATERAL MYRINGOTOMY AND TUBES    . TUBAL LIGATION  OB History   No obstetric history on file.     Family History  Problem Relation Age of Onset  . Arthritis Mother   . Heart disease Unknown        5 brothers and 2 sister  . Diverticulitis Sister   . Aplastic anemia Other     Social History   Tobacco Use  . Smoking status: Former Smoker    Packs/day: 0.50    Years: 40.00    Pack years: 20.00    Types: Cigarettes    Quit date: 02/13/2011    Years since quitting: 8.4  . Smokeless tobacco: Never Used  Substance Use Topics   . Alcohol use: No  . Drug use: No    Home Medications Prior to Admission medications   Medication Sig Start Date End Date Taking? Authorizing Provider  aspirin 81 MG tablet Take 81 mg by mouth daily.    [provider]  atorvastatin (LIPITOR) 40 MG tablet TAKE 1 TABLET BY MOUTH EVERYDAY AT BEDTIME 12/01/18   Susy Frizzle, MD  Cyanocobalamin (B-12) 2500 MCG TABS Take 2,500 mcg by mouth daily.    [provider]  diazepam (VALIUM) 5 MG tablet Take 5 mg by mouth as needed.  07/19/15   [provider]  FLUoxetine (PROZAC) 20 MG capsule TAKE 1 CAPSULE BY MOUTH EVERY DAY 09/02/18   Susy Frizzle, MD  hydrALAZINE (APRESOLINE) 25 MG tablet Take 1 tablet by mouth once a day in the morning. Patient not taking: Reported on 04/01/2019 07/16/18   Lorretta Harp, MD  meloxicam (MOBIC) 15 MG tablet TAKE 1 TABLET BY MOUTH EVERY DAY 05/28/19   Susy Frizzle, MD  Multiple Vitamin (MULTIVITAMIN) tablet Take 1 tablet by mouth daily.    [provider]  traZODone (DESYREL) 50 MG tablet Take 0.5-1 tablets (25-50 mg total) by mouth at bedtime as needed for sleep. 09/03/18   Susy Frizzle, MD    Allergies    Penicillins, Cefaclor, Latex, Metronidazole, Naproxen, Nsaids, Septra [bactrim], Sulfonamide derivatives, and Tape  Review of Systems   Review of Systems  Cardiovascular: Positive for chest pain and palpitations.  All other systems reviewed and are negative.   Physical Exam Updated Vital Signs Temp 97.8 F (36.6 C) (Oral)   Ht 4\' 11"  (1.499 m)   Wt 52.2 kg   BMI 23.23 kg/m   Physical Exam Vitals and nursing note reviewed.  Constitutional:      General: She is not in acute distress.    Appearance: Normal appearance. She is well-developed.  HENT:     Head: Normocephalic and atraumatic.     Right Ear: Hearing normal.     Left Ear: Hearing normal.     Nose: Nose normal.  Eyes:     Conjunctiva/sclera: Conjunctivae normal.     Pupils: Pupils  are equal, round, and reactive to light.  Cardiovascular:     Rate and Rhythm: Tachycardia present. Rhythm irregularly irregular.     Heart sounds: S1 normal and S2 normal. No murmur. No friction rub. No gallop.   Pulmonary:     Effort: Pulmonary effort is normal. No respiratory distress.     Breath sounds: Normal breath sounds.  Chest:     Chest wall: No tenderness.  Abdominal:     General: Bowel sounds are normal.     Palpations: Abdomen is soft.     Tenderness: There is no abdominal tenderness. There is no guarding or rebound. Negative signs include  Murphy's sign and McBurney's sign.     Hernia: No hernia is present.  Musculoskeletal:        General: Normal range of motion.     Cervical back: Normal range of motion and neck supple.  Skin:    General: Skin is warm and dry.     Findings: No rash.  Neurological:     Mental Status: She is alert and oriented to person, place, and time.     GCS: GCS eye subscore is 4. GCS verbal subscore is 5. GCS motor subscore is 6.     Cranial Nerves: No cranial nerve deficit.     Sensory: No sensory deficit.     Coordination: Coordination normal.  Psychiatric:        Speech: Speech normal.        Behavior: Behavior normal.        Thought Content: Thought content normal.     ED Results / Procedures / Treatments   Labs (all labs ordered are listed, but only abnormal results are displayed) Labs Reviewed  CBC WITH DIFFERENTIAL/PLATELET  BASIC METABOLIC PANEL  BRAIN NATRIURETIC PEPTIDE  PROTIME-INR  APTT  TROPONIN I (HIGH SENSITIVITY)    EKG None  Radiology No results found.  Procedures Procedures (including critical care time)  Medications Ordered in ED Medications  metoprolol tartrate (LOPRESSOR) injection 5 mg (has no administration in time range)    ED Course  I have reviewed the triage vital signs and the nursing notes.  Pertinent labs & imaging results that were available during my care of the patient were reviewed by  me and considered in my medical decision making (see chart for details).    MDM Rules/Calculators/A&P                     Patient presents to the emergency department for evaluation of chest pain and racing heartbeat.  Patient does have a history of coronary artery disease, previously seen 85% ostial small ramus branch stenosis.  Patient was found to be in atrial fibrillation by EMS.  This is a new finding for her.  She has been exhibiting moderately rapid ventricular response, rate running in the low 100s and as high as 140.  She was given IV Lopressor with improvement of rate but continues to be in atrial fibrillation.  She was initiated on heparin.  First troponin is 18.  EKG does not suggest any obvious infarct or ischemia.  She will require hospitalization for further management of new onset atrial fibrillation.  Discussed with on-call cardiology.  Requests medicine admission, cardiology will follow to manage A. fib.   CHA2DS2-VASc Score for Atrial Fibrillation Stroke Risk from [email or web address]  on 07/15/2019 ** All calculations should be rechecked by clinician prior to use **  RESULT SUMMARY: 5 points Stroke risk was 7.2% per year in >90,000 patients (the [place] Atrial Fibrillation Cohort Study) and 10.0% risk of stroke/TIA/systemic embolism.  One recommendation suggests a 0 score is "low" risk and may not require anticoagulation; a 1 score is "low-moderate" risk and should consider antiplatelet or anticoagulation, and score 2 or greater is "moderate-high" risk and should otherwise be an anticoagulation candidate.   INPUTS: Age --> 2 = ?50 Sex --> 1 = Female CHF history --> 0 = No Hypertension history --> 1 = Yes Stroke/TIA/thromboembolism history --> 0 = No Vascular disease history (prior MI, peripheral artery disease, or aortic plaque) --> 1 = Yes Diabetes history --> 0 = No  Final Clinical Impression(s) /  ED Diagnoses Final diagnoses:  Atrial fibrillation with [facility])    Rx / DC  Orders ED Discharge Orders    None       Hardie Veltre, Gwenyth Allegra, MD 07/15/19 [id number]

## 2019-07-15 NOTE — Progress Notes (Signed)
Admission H&P is now changed to an ER visit because the patient is able to be discharged from the ER.  Patient has been seen by cardiology.  She is now in NSR and no further cardiology intervention is needed at this time, per Dr. Harrell Gave.  As such, I will request that the EDP discharge the patient.  As per Dr. Harrell Gave, she needs: -5 mg Eliquis PO BID -Metoprolol 12.5 mg PO BID or Toprol XL 25 mg daily (I would start the short-acting to ensure that she tolerates) -Outpatient cardiology f/u with Echo, to be arranged by Dr. [facility].  Will discharge to home at this time.  The patient remained in the ER throughout.   Carlyon Shadow, M.D.

## 2019-07-15 NOTE — ED Notes (Signed)
Pt discharge instructions and prescriptions reviewed with the patient. The patient verbalized understanding of both. Pt discharged. 

## 2019-07-15 NOTE — ED Provider Notes (Signed)
74 year old female with new onset atrial fibrillation with rapid ventricular response plan is for admission to the hospital.  Likely the patient has converted spontaneously while awaiting cardiology evaluation.  Now recommending discharge home.  Recommending that I start her on Eliquis and low-dose metoprolol.  Cards and PCP follow-up.  CHA2DS2/VAS Stroke Risk Points  Current as of 20 minutes ago     5 >= 2 Points: High Risk  1 - 1.99 Points: Medium Risk  0 Points: Low Risk    The patient's score has not changed in the past year.: No Change     Details    This score determines the patient's risk of having a stroke if the  patient has atrial fibrillation.       Points Metrics  0 Has Congestive Heart Failure:  No    Current as of 20 minutes ago  1 Has Vascular Disease:  Yes    Current as of 20 minutes ago  1 Has Hypertension:  Yes    Current as of 20 minutes ago  2 Age:  43    Current as of 20 minutes ago  0 Has Diabetes:  No    Current as of 20 minutes ago  0 Had Stroke:  No  Had TIA:  No  Had thromboembolism:  No    Current as of 20 minutes ago  1 Female:  Yes    Current as of 20 minutes ago             Deno Etienne, DO 07/15/19 1102

## 2019-07-15 NOTE — H&P (Addendum)
History and Physical    Brittany Kirk [id number] DOB: 04-15-1946 DOA: 07/15/2019  PCP: Susy Frizzle, MD Consultants:  Magrinat - oncology; Gwenlyn Found - cardiology Patient coming from:  Home - lives with husband; NOK: Husband, [phone]  Chief Complaint: Chest pain, palpitations  HPI: Brittany Kirk is a 74 y.o. female with medical history significant of HTN; HLD; COPD; depression; nonobstructive CAF; and R breast cancer (2015) presenting with chest pain, palpitations. She reports that last night about 1130 she got up to use the bathroom.  She went back to bed and couldn't go to sleep.  About midnight, she thought she had indigestion and "this flutter, like a little bird in my chest."  It was intermittent.  Then her arms felt achy.  She did not have shooting pains but had some discomfort behind her left shoulder.  They checked her BP and her HR was "not normal for me."  She went back to bed and the fluttering kept going.  They checked her BP again and her HR was 110 (usually in the 60-70s).  So they called 911.  She can still feel it to some extent.  No h/o afib prior.      ED Course:  Carryover, per Dr. Hal Hope:  74 year old female history of nonobstructive CAD presents with chest pain and palpitation was found to be in A. fib with RVR. This is new onset. Cardiology was notified by the ER physician at this time requested hospital admission they will be seeing patient in consult. Patient was started on heparin. Heart rate improved to the 1 dose of IV metoprolol. Cardiology is likely planning cardioversion later today.  Review of Systems: As per HPI; otherwise review of systems reviewed and negative.   Ambulatory Status:  Ambulates without assistance  Past Medical History:  Diagnosis Date  . Allergic rhinitis   . Anxiety   . Asymptomatic bilateral carotid artery stenosis    40-59% (02/2018)  . Atrial fibrillation ([place]) 07/2019  . Breast cancer ([place]) 2015   Right Breast  Cancer  . CAD (coronary artery disease)   . Depression   . Emphysema    no longer uses inhalers  . Gallstones    nausea, pain upper right abdomen  . GERD (gastroesophageal reflux disease)   . H/O hiatal hernia   . Hiatal hernia   . History of skin cancer   . Hyperlipidemia   . Hypertension   . Multinodular goiter (nontoxic)   . Scoliosis   . Tobacco user   . Wears dentures    top    Past Surgical History:  Procedure Laterality Date  . BREAST LUMPECTOMY Right 2015  . CHOLECYSTECTOMY  07/08/2012   Procedure: LAPAROSCOPIC CHOLECYSTECTOMY WITH INTRAOPERATIVE CHOLANGIOGRAM;  Surgeon: Gayland Curry, MD,FACS;  Location: [facility];  Service: General;  Laterality: N/A;  Laparoscopic Cholecystectomy with Intraoperative Cholangiogram  . COLONOSCOPY    . ESOPHAGOSCOPY N/A 05/29/2017   Procedure: ESOPHAGOSCOPY;  Surgeon: Clarene Essex, MD;  Location: [place];  Service: Endoscopy;  Laterality: N/A;  botox injection  . HAND SURGERY Left    wrist  . LEFT HEART CATH AND CORONARY ANGIOGRAPHY N/A 10/11/2017   Procedure: LEFT HEART CATH AND CORONARY ANGIOGRAPHY;  Surgeon: Martinique, Peter M, MD;  Location: [facility];  Service: Cardiovascular;  Laterality: N/A;  . TONSILECTOMY, ADENOIDECTOMY, BILATERAL MYRINGOTOMY AND TUBES    . TUBAL LIGATION      Social History   Socioeconomic History  . Marital status: Married  Spouse name: Not on file  . Number of children: 1  . Years of education: Not on file  . Highest education level: Not on file  Occupational History  . Occupation: retired Clinical cytogeneticist: RETIRED  Tobacco Use  . Smoking status: Former Smoker    Packs/day: 0.50    Years: 40.00    Pack years: 20.00    Types: Cigarettes    Quit date: 02/13/2011    Years since quitting: 8.4  . Smokeless tobacco: Never Used  Substance and Sexual Activity  . Alcohol use: No  . Drug use: No  . Sexual activity: Not on file  Other Topics Concern  . Not on file  Social  History Narrative  . Not on file   Social Determinants of Health   Financial Resource Strain:   . Difficulty of Paying Living Expenses: Not on file  Food Insecurity:   . Worried About Charity fundraiser in the Last Year: Not on file  . Ran Out of Food in the Last Year: Not on file  Transportation Needs:   . Lack of Transportation (Medical): Not on file  . Lack of Transportation (Non-Medical): Not on file  Physical Activity:   . Days of Exercise per Week: Not on file  . Minutes of Exercise per Session: Not on file  Stress:   . Feeling of Stress : Not on file  Social Connections:   . Frequency of Communication with Friends and Family: Not on file  . Frequency of Social Gatherings with Friends and Family: Not on file  . Attends Religious Services: Not on file  . Active Member of Clubs or Organizations: Not on file  . Attends Archivist Meetings: Not on file  . Marital Status: Not on file  Intimate Partner Violence:   . Fear of Current or Ex-Partner: Not on file  . Emotionally Abused: Not on file  . Physically Abused: Not on file  . Sexually Abused: Not on file    Allergies  Allergen Reactions  . Penicillins Anaphylaxis, Itching, Swelling and Rash    Has patient had a PCN reaction causing immediate rash, facial/tongue/throat swelling, SOB or lightheadedness with hypotension: Yes Has patient had a PCN reaction causing severe rash involving mucus membranes or skin necrosis: No Has patient had a PCN reaction that required hospitalization: Yes Has patient had a PCN reaction occurring within the last 10 years: No If all of the above answers are "NO", then may proceed with Cephalosporin use.   . Cefaclor Itching, Swelling and Rash  . Latex Itching and Rash  . Metronidazole Itching, Swelling and Rash  . Naproxen Itching, Swelling and Rash  . Nsaids Itching, Swelling and Rash    Ibuprofen IS tolerated  . Septra [Bactrim] Itching, Swelling and Rash  . Sulfonamide  Derivatives Itching, Swelling and Rash  . Tape Itching and Rash    Family History  Problem Relation Age of Onset  . Arthritis Mother   . Heart disease Other        5 brothers and 2 sister  . Diverticulitis Sister   . Aplastic anemia Other     Prior to Admission medications   Medication Sig Start Date End Date Taking? Authorizing Provider  ascorbic acid (VITAMIN C) 500 MG tablet Take 500 mg by mouth daily.   Yes [provider]  aspirin 81 MG tablet Take 81 mg by mouth at bedtime.    Yes [provider]  atorvastatin (  LIPITOR) 40 MG tablet TAKE 1 TABLET BY MOUTH EVERYDAY AT BEDTIME Patient taking differently: Take 40 mg by mouth at bedtime.  12/01/18  Yes Susy Frizzle, MD  cholecalciferol (VITAMIN D3) 25 MCG (1000 UNIT) tablet Take 1,000 Units by mouth daily.   Yes [provider]  Cyanocobalamin (B-12) 2500 MCG TABS Take 2,500 mcg by mouth daily.   Yes [provider]  diazepam (VALIUM) 5 MG tablet Take 5 mg by mouth daily as needed for anxiety.  07/19/15  Yes [provider]  omeprazole (PRILOSEC) 20 MG capsule Take 20 mg by mouth daily. 07/06/19  Yes [provider]  traZODone (DESYREL) 50 MG tablet Take 0.5-1 tablets (25-50 mg total) by mouth at bedtime as needed for sleep. 09/03/18  Yes Susy Frizzle, MD  zinc gluconate 50 MG tablet Take 50 mg by mouth daily.   Yes [provider]    Physical Exam: Vitals:   07/15/19 0730 07/15/19 0745 07/15/19 0800 07/15/19 0815  BP: 128/63 136/67 137/78 136/63  Pulse: 80 82 (!) 123 (!) 113  Resp: (!) 23 19 (!) 26 (!) 21  Temp:      TempSrc:      SpO2: 98% 98% 99% 100%  Weight:      Height:         . General:  Appears calm and comfortable and is NAD . Eyes:  PERRL, EOMI, normal lids, iris . ENT:  grossly normal hearing, lips & tongue, mmm; appropriate (mostly artificial) dentition . Neck:  no LAD, masses or thyromegaly; R > L carotid bruits . Cardiovascular:   Irregularly irregular, rate controlled in 80s, no m/r/g. 1+ LE edema.  Marland Kitchen Respiratory:   CTA bilaterally with no wheezes/rales/rhonchi.  Normal respiratory effort. . Abdomen:  soft, NT (has chronic RLQ discomfort and this is unchanged per her report), ND, NABS . Skin:  no rash or induration seen on limited exam . Musculoskeletal:  grossly normal tone BUE/BLE, good ROM, no bony abnormality . Lower extremity:  No LE edema.  Limited foot exam with no ulcerations.  2+ distal pulses. Marland Kitchen Psychiatric:  grossly normal mood and affect, speech fluent and appropriate, AOx3 . Neurologic:  CN 2-12 grossly intact, moves all extremities in coordinated fashion, sensation intact    Radiological Exams on Admission: DG Chest Port 1 View  Result Date: 07/15/2019 CLINICAL DATA:  Chest pain and tachycardia EXAM: PORTABLE CHEST 1 VIEW COMPARISON:  10/11/2017 FINDINGS: Cardiac shadow is stable. The lungs are well aerated bilaterally. No focal infiltrate or sizable effusion is seen. Aortic calcifications are noted. Postsurgical changes in the right axilla are seen. No bony abnormality is noted. IMPRESSION: No acute abnormality noted. Electronically Signed   By: Inez Catalina M.D.   On: 07/15/2019 02:59    EKG: Independently reviewed.  Afib with rate 103; nonspecific ST changes with no evidence of acute ischemia   Labs on Admission: I have personally reviewed the available labs and imaging studies at the time of the admission.  Pertinent labs:   Glucose 109 BNP 117.2 HS troponin 18, 69 WBC 6.6 Hgb 11.8 INR 1.1 Respiratory panel PCR negative   Assessment/Plan Principal Problem:   Atrial fibrillation with RVR (HCC) Active Problems:   Dyslipidemia, goal LDL below 70   Essential hypertension   CAD in native artery   COPD (chronic obstructive pulmonary disease) (HCC)   Stage 2 chronic kidney disease due to benign hypertension   History of breast cancer   Asymptomatic bilateral carotid  artery stenosis    Multinodular goiter (nontoxic)   Atrial Fibrillation:   -Patient presenting with new-onset afib.  -Etiology is thought to be related to HTN, possibly thyroid dysfunction, possibly ischemic heart disease -Since the afib onset may have been within 48 hours, cardioversion is being considered. -Since the afib onset is unknown, will focus on rate control and searching for the underlying cause at this time. -Will plan to place in observation status on telemetry; her rate was controlled with one dose of metoprolol and so at this time she does not appear to need Cardizem drip or other rate controlling treatment urgently. -HS troponin initially normal and repeat elevated with elevated delta troponin; will repeat.  This may be due to demand ischemia but she does have h/o 85% blockage in a coronary vessel in 5/19 and so ischemia must be considered. -I considered stopping Heparin and starting Eliquis for the afib, but given the question of ischemic heart disease as a possible cause will resume Heparin for now.  -Will request Echocardiogram for further evaluation  -Will risk stratify with FLP and HgbA1c; will also order TSH -Repeat EKG in AM  -Will consult cardiology  -Heart rate is well controlled. -CHA2DS2-VASc Score is >2 and so patient will need oral anticoagulation.  -Hold ASA for now while on Heparin  HTN -There was previously discussion about starting Coreg after her cath in 5/19, but she is not taking this at this time - this appears to be a good choice but will defer to cardiology -She last saw Dr. Gwenlyn Found in 03/2019 -At that time, he noted that she had been on hydralazine and losartan in the past but discontinued on her own due to symptomatic hypotension  HLD -Continue Lipitor -Lipids were checked in 2/20 (133/55/61/88); will repeat  CAD -85% ostial ramus stenosis with otherwise diffuse nonobstructive CAD on cath in 5/19 -Her BP was poorly controlled at that time and so the decision was made  for medical management with long-acting nitrate (not taking) and anti-HTN (not taking) medications -If this was unsuccessful, plan was for consideration of stent -Based on mildly elevated HS troponin with + delta, will continue Heparin infusion and recheck troponin  -Will check Echo -Patient may need repeat cath to assess if ischemic CAD was the cause of her afib  Carotid stenosis -Moderately severe R ICA stenosis, moderate on the L in 02/2019 -This is being followed by cardiology  Stage 2 CKD -Appears to be stable at this time  COPD -She does not appear to be taking medications for this issue at this time  H/o breast cancer -Remote h/o early stage estrogen receptor-positive breast cancer -s/p lumpectomy, LN dissection -She opted against adjuvant radiation -She has completed 5 years of anastrozole -She was also found to have osteoporosis and opted against pharmacological interventions -She has been released by oncology as of 05/06/19  H/o multinodular goiter -Normal TSH in 10/20 -Recheck TSH  Anxiety -Continue prn Valium -Continue Trazodone prn, as well    Note: This patient has been tested and is negative for the novel coronavirus COVID-19.   DVT prophylaxis: Heparin Code Status:  Full - confirmed with patient Family Communication: None present; I spoke with her husband at the time of evaluation Disposition Plan:  She is anticipated to d/c to home without [facility] services once her cardiology issues have been resolved. Consults called: Cardiology  Admission status: It is my clinical opinion that referral for OBSERVATION is reasonable and necessary in this patient based on the above  information provided. The aforementioned taken together are felt to place the patient at high risk for further clinical deterioration. However it is anticipated that the patient may be medically stable for discharge from the hospital within 24 to 48 hours.     Karmen Bongo MD [facility]   How to contact the [facility] Attending or Consulting provider [place] or covering provider during after hours [place], for this patient?  1. Check the care team in [facility] and look for a) attending/consulting [facility] provider listed and b) the [facility] team listed 2. Log into [email or web address] and use [place]'s universal password to access. If you do not have the password, please contact the hospital operator. 3. Locate the [facility] provider you are looking for under [facility] and page to a number that you can be directly reached. 4. If you still have difficulty reaching the provider, please page the [facility] (Director on Call) for the Hospitalists listed on amion for assistance.   07/15/2019, 8:52 AM

## 2019-07-16 ENCOUNTER — Telehealth: Payer: Self-pay | Admitting: Family Medicine

## 2019-07-16 ENCOUNTER — Telehealth: Payer: Self-pay | Admitting: Cardiovascular Disease

## 2019-07-16 NOTE — Telephone Encounter (Signed)
Called and spoke with patient to follow up with her from her recent hospital discharge. Patient states that she is doing well today. Her atrial Fibrillation corrected itself yesterday without any medical intervention. She informed me that she does have an appointment with her cardiologist. Also informed her to come into our practice for a follow up with Dr. Dennard Schaumann. Patient verbalized understanding and scheduled and hospital follow up with Dr. Dennard Schaumann for Monday February the 8th at 2:30 PM

## 2019-07-16 NOTE — Telephone Encounter (Signed)
Spoke to pt husband who report pt had previously called in for an alternative medication to Eliquis. He report the cost is too expensive. Husband now reports they will continue with current plan and will pay for medication.

## 2019-07-16 NOTE — Telephone Encounter (Signed)
Pt c/o medication issue:  1. Name of Medication: apixaban (ELIQUIS) 5 MG TABS tablet  2. How are you currently taking this medication (dosage and times per day)? Has not started medication  3. Are you having a reaction (difficulty breathing--STAT)? no  4. What is your medication issue? Patient states the medication is $400 and would like to know if there is another cheaper medication she could take instead.

## 2019-07-16 NOTE — Telephone Encounter (Signed)
Patient is calling back stating she no longer wants a cheaper alternative, she can afford to keep taking Eliquis.

## 2019-07-20 ENCOUNTER — Encounter: Payer: Self-pay | Admitting: Family Medicine

## 2019-07-20 ENCOUNTER — Other Ambulatory Visit: Payer: Self-pay

## 2019-07-20 ENCOUNTER — Ambulatory Visit (INDEPENDENT_AMBULATORY_CARE_PROVIDER_SITE_OTHER): Payer: Medicare Other | Admitting: Family Medicine

## 2019-07-20 VITALS — BP 128/70 | HR 52 | Temp 96.9°F | Resp 14 | Ht 61.0 in | Wt 119.0 lb

## 2019-07-20 DIAGNOSIS — I480 Paroxysmal atrial fibrillation: Secondary | ICD-10-CM | POA: Diagnosis not present

## 2019-07-20 MED ORDER — METOPROLOL SUCCINATE ER 25 MG PO TB24: 12.5000 mg | ORAL_TABLET | Freq: Every day | ORAL | 3 refills | Status: DC

## 2019-07-20 NOTE — Progress Notes (Signed)
Subjective:    Patient ID: Brittany Kirk, female    DOB: Sep 29, 1945, 74 y.o.   MRN: [id number]  HPI Patient was recently admitted to the hospital with an irregular heart rate and was found to be in atrial fibrillation with rapid ventricular response.  She was started on Eliquis 5 mg twice daily.  She is also on metoprolol 12.5 mg twice daily.  She is here today for follow-up.  Thyroid test in the hospital was normal.  However the patient has been only taking her metoprolol in the morning due to bradycardia in the evening.  She states that she will take 12.5 mg in the morning however in the afternoon her heart rate is typically in the 40s and therefore she will not take the evening dose of metoprolol.  She has occasionally felt her heart skip like it is gone out of rhythm but extinguish on its iron.  Today she is in normal sinus rhythm with a heart rate around 60 bpm on my exam.  She denies any syncope or near syncope or chest pain or shortness of breath or dyspnea on exertion.  She denies any bleeding or bruising since she started taking Eliquis.  She denies any strokelike symptoms. Past Medical History:  Diagnosis Date  . Allergic rhinitis   . Anxiety   . Asymptomatic bilateral carotid artery stenosis    40-59% (02/2018)  . Atrial fibrillation ([place]) 07/2019  . Breast cancer ([place]) 2015   Right Breast Cancer  . CAD (coronary artery disease)   . Depression   . Emphysema    no longer uses inhalers  . Gallstones    nausea, pain upper right abdomen  . GERD (gastroesophageal reflux disease)   . H/O hiatal hernia   . Hiatal hernia   . History of skin cancer   . Hyperlipidemia   . Hypertension   . Multinodular goiter (nontoxic)   . Scoliosis   . Tobacco user   . Wears dentures    top   Past Surgical History:  Procedure Laterality Date  . BREAST LUMPECTOMY Right 2015  . CHOLECYSTECTOMY  07/08/2012   Procedure: LAPAROSCOPIC CHOLECYSTECTOMY WITH INTRAOPERATIVE CHOLANGIOGRAM;  Surgeon:  Gayland Curry, MD,FACS;  Location: [facility];  Service: General;  Laterality: N/A;  Laparoscopic Cholecystectomy with Intraoperative Cholangiogram  . COLONOSCOPY    . ESOPHAGOSCOPY N/A 05/29/2017   Procedure: ESOPHAGOSCOPY;  Surgeon: Clarene Essex, MD;  Location: [facility];  Service: Endoscopy;  Laterality: N/A;  botox injection  . HAND SURGERY Left    wrist  . LEFT HEART CATH AND CORONARY ANGIOGRAPHY N/A 10/11/2017   Procedure: LEFT HEART CATH AND CORONARY ANGIOGRAPHY;  Surgeon: Martinique, Peter M, MD;  Location: [facility];  Service: Cardiovascular;  Laterality: N/A;  . TONSILECTOMY, ADENOIDECTOMY, BILATERAL MYRINGOTOMY AND TUBES    . TUBAL LIGATION     Current Outpatient Medications on File Prior to Visit  Medication Sig Dispense Refill  . apixaban (ELIQUIS) 5 MG TABS tablet Take 1 tablet (5 mg total) by mouth 2 (two) times daily. 60 tablet 0  . ascorbic acid (VITAMIN C) 500 MG tablet Take 500 mg by mouth daily.    Marland Kitchen atorvastatin (LIPITOR) 40 MG tablet TAKE 1 TABLET BY MOUTH EVERYDAY AT BEDTIME (Patient taking differently: Take 40 mg by mouth at bedtime. ) 90 tablet 1  . cholecalciferol (VITAMIN D3) 25 MCG (1000 UNIT) tablet Take 1,000 Units by mouth daily.    . Cyanocobalamin (B-12) 2500 MCG TABS Take 2,500  mcg by mouth daily.    . diazepam (VALIUM) 5 MG tablet Take 5 mg by mouth daily as needed for anxiety.   3  . omeprazole (PRILOSEC) 20 MG capsule Take 20 mg by mouth daily.    . traZODone (DESYREL) 50 MG tablet Take 0.5-1 tablets (25-50 mg total) by mouth at bedtime as needed for sleep. 90 tablet 3  . zinc gluconate 50 MG tablet Take 50 mg by mouth daily.     No current facility-administered medications on file prior to visit.   Allergies  Allergen Reactions  . Penicillins Anaphylaxis, Itching, Swelling and Rash    Has patient had a PCN reaction causing immediate rash, facial/tongue/throat swelling, SOB or lightheadedness with hypotension: Yes Has patient had a PCN reaction  causing severe rash involving mucus membranes or skin necrosis: No Has patient had a PCN reaction that required hospitalization: Yes Has patient had a PCN reaction occurring within the last 10 years: No If all of the above answers are "NO", then may proceed with Cephalosporin use.   . Cefaclor Itching, Swelling and Rash  . Latex Itching and Rash  . Metronidazole Itching, Swelling and Rash  . Naproxen Itching, Swelling and Rash  . Nsaids Itching, Swelling and Rash    Ibuprofen IS tolerated  . Septra [Bactrim] Itching, Swelling and Rash  . Sulfonamide Derivatives Itching, Swelling and Rash  . Tape Itching and Rash   Social History   Socioeconomic History  . Marital status: Married    Spouse name: Not on file  . Number of children: 1  . Years of education: Not on file  . Highest education level: Not on file  Occupational History  . Occupation: retired Clinical cytogeneticist: RETIRED  Tobacco Use  . Smoking status: Former Smoker    Packs/day: 0.50    Years: 40.00    Pack years: 20.00    Types: Cigarettes    Quit date: 02/13/2011    Years since quitting: 8.4  . Smokeless tobacco: Never Used  Substance and Sexual Activity  . Alcohol use: No  . Drug use: No  . Sexual activity: Not on file  Other Topics Concern  . Not on file  Social History Narrative  . Not on file   Social Determinants of Health   Financial Resource Strain:   . Difficulty of Paying Living Expenses: Not on file  Food Insecurity:   . Worried About Charity fundraiser in the Last Year: Not on file  . Ran Out of Food in the Last Year: Not on file  Transportation Needs:   . Lack of Transportation (Medical): Not on file  . Lack of Transportation (Non-Medical): Not on file  Physical Activity:   . Days of Exercise per Week: Not on file  . Minutes of Exercise per Session: Not on file  Stress:   . Feeling of Stress : Not on file  Social Connections:   . Frequency of Communication with Friends and  Family: Not on file  . Frequency of Social Gatherings with Friends and Family: Not on file  . Attends Religious Services: Not on file  . Active Member of Clubs or Organizations: Not on file  . Attends Archivist Meetings: Not on file  . Marital Status: Not on file  Intimate Partner Violence:   . Fear of Current or Ex-Partner: Not on file  . Emotionally Abused: Not on file  . Physically Abused: Not on file  . Sexually Abused:  Not on file     Review of Systems  All other systems reviewed and are negative.      Objective:   Physical Exam Vitals reviewed.  Constitutional:      General: She is not in acute distress.    Appearance: She is well-developed. She is not diaphoretic.  HENT:     Head: Normocephalic and atraumatic.     Right Ear: External ear normal.     Left Ear: External ear normal.     Mouth/Throat:     Pharynx: No oropharyngeal exudate.  Eyes:     Conjunctiva/sclera: Conjunctivae normal.     Pupils: Pupils are equal, round, and reactive to light.  Neck:     Thyroid: No thyromegaly.     Vascular: No JVD.  Cardiovascular:     Rate and Rhythm: Normal rate and regular rhythm.     Heart sounds: Normal heart sounds. No murmur. No friction rub. No gallop.   Pulmonary:     Effort: Pulmonary effort is normal. No respiratory distress.     Breath sounds: Normal breath sounds. No stridor. No wheezing or rales.  Abdominal:     General: Bowel sounds are normal. There is no distension.     Palpations: Abdomen is soft.     Tenderness: There is no abdominal tenderness. There is no guarding.  Musculoskeletal:     Cervical back: Normal range of motion.  Lymphadenopathy:     Cervical: No cervical adenopathy.  Neurological:     Mental Status: She is alert and oriented to person, place, and time.     Cranial Nerves: No cranial nerve deficit.     Sensory: No sensory deficit.     Motor: Tremor present. No atrophy, abnormal muscle tone or seizure activity.     Deep  Tendon Reflexes: Reflexes are normal and symmetric.           Assessment & Plan:  Paroxysmal atrial fibrillation (HCC)  Spent 30 minutes today with the patient discussing the natural history of atrial fibrillation.  She is currently in normal sinus rhythm.  I recommended that she change her metoprolol to metoprolol succinate 25 mg tablets, one half a tablet daily.  This way she will take the medicine spread out every 24 hours.  Rather than give all 12.5 mg in the 12-hour dose and then nothing for 12 hours.  Monitor for bradycardia.  If she does develop bradycardia we will need to discontinue metoprolol altogether.  Continue the Eliquis for stroke prevention.  We discussed the fact that the stroke risk is similar whether she is constantly in atrial fibrillation versus having episodes of atrial fibrillation.  We discussed warning signs such as shortness of breath or chest pain or palpitations and when to seek medical attention.

## 2019-07-21 NOTE — Progress Notes (Signed)
Cardiology Clinic Note   Patient Name: Brittany Kirk Date of Encounter: 07/22/2019  Primary Care Provider:  Susy Frizzle, MD Primary Cardiologist:  [name], MD  Patient Profile    Brittany Kirk 74 year old female presents to the clinic today for hospital follow-up.  Past Medical History    Past Medical History:  Diagnosis Date  . Allergic rhinitis   . Anxiety   . Asymptomatic bilateral carotid artery stenosis    40-59% (02/2018)  . Atrial fibrillation ([place]) 07/2019  . Breast cancer ([place]) 2015   Right Breast Cancer  . CAD (coronary artery disease)   . Depression   . Emphysema    no longer uses inhalers  . Gallstones    nausea, pain upper right abdomen  . GERD (gastroesophageal reflux disease)   . H/O hiatal hernia   . Hiatal hernia   . History of skin cancer   . Hyperlipidemia   . Hypertension   . Multinodular goiter (nontoxic)   . Scoliosis   . Tobacco user   . Wears dentures    top   Past Surgical History:  Procedure Laterality Date  . BREAST LUMPECTOMY Right 2015  . CHOLECYSTECTOMY  07/08/2012   Procedure: LAPAROSCOPIC CHOLECYSTECTOMY WITH INTRAOPERATIVE CHOLANGIOGRAM;  Surgeon: Gayland Curry, MD,FACS;  Location: [facility];  Service: General;  Laterality: N/A;  Laparoscopic Cholecystectomy with Intraoperative Cholangiogram  . COLONOSCOPY    . ESOPHAGOSCOPY N/A 05/29/2017   Procedure: ESOPHAGOSCOPY;  Surgeon: Clarene Essex, MD;  Location: [facility];  Service: Endoscopy;  Laterality: N/A;  botox injection  . HAND SURGERY Left    wrist  . LEFT HEART CATH AND CORONARY ANGIOGRAPHY N/A 10/11/2017   Procedure: LEFT HEART CATH AND CORONARY ANGIOGRAPHY;  Surgeon: Martinique, Peter M, MD;  Location: [facility];  Service: Cardiovascular;  Laterality: N/A;  . TONSILECTOMY, ADENOIDECTOMY, BILATERAL MYRINGOTOMY AND TUBES    . TUBAL LIGATION      Allergies  Allergies  Allergen Reactions  . Penicillins Anaphylaxis, Itching, Swelling and Rash    Has  patient had a PCN reaction causing immediate rash, facial/tongue/throat swelling, SOB or lightheadedness with hypotension: Yes Has patient had a PCN reaction causing severe rash involving mucus membranes or skin necrosis: No Has patient had a PCN reaction that required hospitalization: Yes Has patient had a PCN reaction occurring within the last 10 years: No If all of the above answers are "NO", then may proceed with Cephalosporin use.   . Cefaclor Itching, Swelling and Rash  . Latex Itching and Rash  . Metronidazole Itching, Swelling and Rash  . Naproxen Itching, Swelling and Rash  . Nsaids Itching, Swelling and Rash    Ibuprofen IS tolerated  . Septra [Bactrim] Itching, Swelling and Rash  . Sulfonamide Derivatives Itching, Swelling and Rash  . Tape Itching and Rash    History of Present Illness    [name] has a PMH of CAD, essential hypertension, bilateral carotid artery disease, unstable angina, accelerated hypertension, A. fib with RVR, COPD, GERD, stage II chronic kidney disease, and depression.  She has had a remote cardiac catheterization by Dr. Lia Foyer with no intervention.  She had a negative stress test 01/2016 and again in 12/2016.  She was seen in the cardiology clinic 10/2017 for progressive chest pain and was quickly taken to the Cath Lab.  Her cardiac catheterization showed single-vessel disease involving her ostium of the ramus intermediate branch, normal LV function, normal LVEDP.  No PCI was performed.  Her catheterization  remained unchanged from prior in 2010 of which her ramus branch was untreated due to concern for shifting plaque into the LAD or circumflex with PCI.  Her blood pressure had been intermittently elevated and she was restarted on losartan and hydralazine.  During a virtual visit 03/2019 with Dr. Gwenlyn Found she reported the discontinuation of hydralazine and losartan due to symptomatic hypotension.  Her carotid Dopplers 02/2019 showed moderate to severe right ICA  stenosis and moderate left.  She was admitted to the hospital 07/15/2019 and cardiology was consulted at the request of Dr. Inda Merlin for an evaluation of her new onset atrial fibrillation with RVR.  Heart rate 100-1 40s.  She received IV Lopressor, potassium, and IV heparin which was transitioned to Eliquis 5 mg twice daily.  She converted from atrial fibrillation to normal sinus rhythm spontaneously thank you and was discharged home on Eliquis and low-dose metoprolol.  She presents to the clinic today and states she has not had any further episodes of palpitations or irregular heartbeat.  She feels she is slightly fatigued.  However she presented to her PCP on 07/16/2019 and he changed her metoprolol to metoprolol succinate 12.5 mg daily.  She is slowly returning to her normal daily activities.  She is tolerating apixaban well and has no cardiac complaints at this time.  She has noticed slight increased lower extremity edema.  I will recommend lower extremity support stockings and give her the salty 6 sheet.  She denies chest pain, shortness of breath, palpitations, melena, hematuria, hemoptysis, diaphoresis, weakness, presyncope, syncope, orthopnea, and PND.   Home Medications    Prior to Admission medications   Medication Sig Start Date End Date Taking? Authorizing Provider  apixaban (ELIQUIS) 5 MG TABS tablet Take 1 tablet (5 mg total) by mouth 2 (two) times daily. 07/15/19 08/14/19  Deno Etienne, DO  ascorbic acid (VITAMIN C) 500 MG tablet Take 500 mg by mouth daily.    [provider]  atorvastatin (LIPITOR) 40 MG tablet TAKE 1 TABLET BY MOUTH EVERYDAY AT BEDTIME Patient taking differently: Take 40 mg by mouth at bedtime.  12/01/18   Susy Frizzle, MD  cholecalciferol (VITAMIN D3) 25 MCG (1000 UNIT) tablet Take 1,000 Units by mouth daily.    [provider]  Cyanocobalamin (B-12) 2500 MCG TABS Take 2,500 mcg by mouth daily.    [provider]  diazepam (VALIUM) 5 MG tablet  Take 5 mg by mouth daily as needed for anxiety.  07/19/15   [provider]  metoprolol succinate (TOPROL-XL) 25 MG 24 hr tablet Take 0.5 tablets (12.5 mg total) by mouth daily. 07/20/19   Susy Frizzle, MD  omeprazole (PRILOSEC) 20 MG capsule Take 20 mg by mouth daily. 07/06/19   [provider]  traZODone (DESYREL) 50 MG tablet Take 0.5-1 tablets (25-50 mg total) by mouth at bedtime as needed for sleep. 09/03/18   Susy Frizzle, MD  zinc gluconate 50 MG tablet Take 50 mg by mouth daily.    [provider]    Family History    Family History  Problem Relation Age of Onset  . Arthritis Mother   . Heart disease Other        5 brothers and 2 sister  . Diverticulitis Sister   . Aplastic anemia Other    She indicated that her mother is deceased. She indicated that her father is deceased. She indicated that the status of her sister is unknown. She indicated that only one of her  two others is alive.  Social History    Social History   Socioeconomic History  . Marital status: Married    Spouse name: Not on file  . Number of children: 1  . Years of education: Not on file  . Highest education level: Not on file  Occupational History  . Occupation: retired Clinical cytogeneticist: RETIRED  Tobacco Use  . Smoking status: Former Smoker    Packs/day: 0.50    Years: 40.00    Pack years: 20.00    Types: Cigarettes    Quit date: 02/13/2011    Years since quitting: 8.4  . Smokeless tobacco: Never Used  Substance and Sexual Activity  . Alcohol use: No  . Drug use: No  . Sexual activity: Not on file  Other Topics Concern  . Not on file  Social History Narrative  . Not on file   Social Determinants of Health   Financial Resource Strain:   . Difficulty of Paying Living Expenses: Not on file  Food Insecurity:   . Worried About Charity fundraiser in the Last Year: Not on file  . Ran Out of Food in the Last Year: Not on file  Transportation Needs:    . Lack of Transportation (Medical): Not on file  . Lack of Transportation (Non-Medical): Not on file  Physical Activity:   . Days of Exercise per Week: Not on file  . Minutes of Exercise per Session: Not on file  Stress:   . Feeling of Stress : Not on file  Social Connections:   . Frequency of Communication with Friends and Family: Not on file  . Frequency of Social Gatherings with Friends and Family: Not on file  . Attends Religious Services: Not on file  . Active Member of Clubs or Organizations: Not on file  . Attends Archivist Meetings: Not on file  . Marital Status: Not on file  Intimate Partner Violence:   . Fear of Current or Ex-Partner: Not on file  . Emotionally Abused: Not on file  . Physically Abused: Not on file  . Sexually Abused: Not on file     Review of Systems    General:  No chills, fever, night sweats or weight changes.  Cardiovascular:  No chest pain, dyspnea on exertion, edema, orthopnea, palpitations, paroxysmal nocturnal dyspnea. Dermatological: No rash, lesions/masses Respiratory: No cough, dyspnea Urologic: No hematuria, dysuria Abdominal:   No nausea, vomiting, diarrhea, bright red blood per rectum, melena, or hematemesis Neurologic:  No visual changes, wkns, changes in mental status. All other systems reviewed and are otherwise negative except as noted above.  Physical Exam    VS:  BP (!) 122/58   Pulse (!) 54   Ht 4\' 11"  (1.499 m)   Wt 118 lb (53.5 kg)   BMI 23.83 kg/m  , BMI Body mass index is 23.83 kg/m. GEN: Well nourished, well developed, in no acute distress. HEENT: normal. Neck: Supple, no JVD, carotid bruits, or masses. Cardiac: RRR, no murmurs, rubs, or gallops. No clubbing, cyanosis, generalized lower extremity bilateral nonpitting edema.  Radials/DP/PT 2+ and equal bilaterally.  Respiratory:  Respirations regular and unlabored, clear to auscultation bilaterally. GI: Soft, nontender, nondistended, BS + x 4. MS: no  deformity or atrophy. Skin: warm and dry, no rash. Neuro:  Strength and sensation are intact. Psych: Normal affect.  Accessory Clinical Findings    ECG personally reviewed by me today-sinus bradycardia cannot rule out anterior infarct undetermined age  54 bpm- No acute changes  EKG 07/16/2019 Atrial fibrillation 103 bpm   Cardiac cath 10/11/2017  Ost LAD lesion is 30% stenosed.  Prox LAD lesion is 40% stenosed.  Ost Ramus lesion is 85% stenosed.  Prox RCA to Mid RCA lesion is 35% stenosed.  The left ventricular systolic function is normal.  LV end diastolic pressure is normal.  The left ventricular ejection fraction is 55-65% by visual estimate.  1. Single vessel obstructive CAD involving the ostium of the ramus intermediate branch. 2. Normal LV function 3. Normal LVEDP  Plan: Cardiac cath is unchanged from prior study in 2010. The Ramus branch was untreated at that time due to concern about shifting plaque into the LAD or LCx with PCI. The patient is currently on no antianginal therapy and is severely hypertensive in the lab. I would recommend aggressive medical therapy and BP control. Will add Coreg and long acting nitrate. If she continues to have refractory angina despite optimal medical therapy and BP control, PCI of the ramus could be considered.   Coronary Diagrams  Diagnostic Dominance: Right      Myoview stress test 01/05/2017  CLinically negative for ischemia  No EKG changes with infusion  LVEF 83%  Low risk   Assessment & Plan   1.  New onset atrial fibrillation with RVR-heart rate today sinus bradycardia 54 bpm.  Patient presents to the emergency department 07/15/2019 with new onset A. fib RVR.  Was given IV heparin and IV Lopressor.  She converted to normal sinus rhythm and was transitioned to Eliquis and low-dose metoprolol. CHA2DS2-VASc score 4 (female, age, PAD, HTN) Continue apixaban 5 mg twice daily Continue metoprolol succinate 12.5 mg  daily Avoid triggers caffeine, chocolate, EtOH, OSA, etc.  Essential hypertension-BP today 122/58 Continue metoprolol succinate 12.5 mg daily  Heart healthy low-sodium diet Increase physical activity as tolerated  Lower extremity edema-generalized lower extremity nonpitting edema.  Has noticed since hospital discharge. Heart healthy low-sodium diet-salty 6 given Wear lower extremity support stockings-[place] support stockings sheet given Elevate lower extremities when not active  Hyperlipidemia-07/15/2019: Cholesterol 133; HDL 59; LDL Cholesterol 64; Triglycerides 48; VLDL 10 Continue atorvastatin 40 mg daily Heart healthy low-sodium high-fiber diet  Coronary artery disease-no chest pain today.  Cardiac catheterization 2019 showed ostial ramus branch disease with no other significant change from previous cath.  Medical management was recommended Continue metoprolol succinate 12.5 mg daily Continue atorvastatin 40 mg daily  Disposition: Follow-up with Dr. Gwenlyn Found in 3 months.  Jossie Ng. [facility] [place] [address] Office [phone] Fax [phone]

## 2019-07-22 ENCOUNTER — Other Ambulatory Visit: Payer: Self-pay

## 2019-07-22 ENCOUNTER — Encounter: Payer: Self-pay | Admitting: General Practice

## 2019-07-22 ENCOUNTER — Ambulatory Visit (INDEPENDENT_AMBULATORY_CARE_PROVIDER_SITE_OTHER): Payer: Medicare Other | Admitting: General Practice

## 2019-07-22 VITALS — BP 122/58 | HR 54 | Ht 59.0 in | Wt 118.0 lb

## 2019-07-22 DIAGNOSIS — E785 Hyperlipidemia, unspecified: Secondary | ICD-10-CM | POA: Diagnosis not present

## 2019-07-22 DIAGNOSIS — I4891 Unspecified atrial fibrillation: Secondary | ICD-10-CM

## 2019-07-22 DIAGNOSIS — R600 Localized edema: Secondary | ICD-10-CM

## 2019-07-22 DIAGNOSIS — I2510 Atherosclerotic heart disease of native coronary artery without angina pectoris: Secondary | ICD-10-CM

## 2019-07-22 DIAGNOSIS — I10 Essential (primary) hypertension: Secondary | ICD-10-CM | POA: Diagnosis not present

## 2019-07-22 NOTE — Patient Instructions (Signed)
Special Instructions: PLEASE PURCHASE AND WEAR COMPRESSION STOCKINGS DAILY AND OFF AT BEDTIME. Compression stockings are elastic socks that squeeze the legs. They help to increase blood flow to the legs and to decrease swelling in the legs from fluid retention, and reduce the chance of developing blood clots in the lower legs.   PLEASE READ AND FOLLOW SALTY 6 ATTACHED  Reduce your risk of getting COVID-19 With your heart disease it is especially important for people at increased risk of severe illness from COVID-19, and those who live with them, to protect themselves from getting COVID-19. The best way to protect yourself and to help reduce the spread of the virus that causes COVID-19 is to: Marland Kitchen Limit your interactions with other people as much as possible. . Take precautions to prevent getting COVID-19 when you do interact with others. If you start feeling sick and think you may have COVID-19, get in touch with your healthcare provider within 24 hours.  Follow-Up: 3 months  In Person [name], MD.    At [facility], you and your health needs are our priority.  As part of our continuing mission to provide you with exceptional heart care, we have created designated Provider Care Teams.  These Care Teams include your primary Cardiologist (physician) and Advanced Practice Providers (APPs -  Physician Assistants and Nurse Practitioners) who all work together to provide you with the care you need, when you need it.  Thank you for choosing [facility] at [facility]!!

## 2019-08-04 ENCOUNTER — Telehealth (INDEPENDENT_AMBULATORY_CARE_PROVIDER_SITE_OTHER): Payer: Medicare Other | Admitting: Family Medicine

## 2019-08-04 DIAGNOSIS — I2510 Atherosclerotic heart disease of native coronary artery without angina pectoris: Secondary | ICD-10-CM | POA: Diagnosis not present

## 2019-08-04 DIAGNOSIS — Z23 Encounter for immunization: Secondary | ICD-10-CM

## 2019-08-04 NOTE — Progress Notes (Signed)
Subjective:    Patient ID: Brittany Kirk, female    DOB: 1946-06-03, 74 y.o.   MRN: [id number]  HPI  Patient is being seen today as a telephone visit.  She consents to be seen via telephone.  Phone call began at [phone].  Phone call concluded at [phone].  The patient is scheduled to receive her Covid vaccination on Monday.  However she is concerned about the safety of the vaccine given the fact that she is on Eliquis for atrial fibrillation and also has atrial fibrillation.  She is also concerned about the safety of the vaccine.  She denies any recent illnesses.  She does have a history of allergies to penicillin as well as latex sulfur and metronidazole. Past Medical History:  Diagnosis Date  . Allergic rhinitis   . Anxiety   . Asymptomatic bilateral carotid artery stenosis    40-59% (02/2018)  . Atrial fibrillation ([place]) 07/2019  . Breast cancer ([place]) 2015   Right Breast Cancer  . CAD (coronary artery disease)   . Depression   . Emphysema    no longer uses inhalers  . Gallstones    nausea, pain upper right abdomen  . GERD (gastroesophageal reflux disease)   . H/O hiatal hernia   . Hiatal hernia   . History of skin cancer   . Hyperlipidemia   . Hypertension   . Multinodular goiter (nontoxic)   . Scoliosis   . Tobacco user   . Wears dentures    top   Past Surgical History:  Procedure Laterality Date  . BREAST LUMPECTOMY Right 2015  . CHOLECYSTECTOMY  07/08/2012   Procedure: LAPAROSCOPIC CHOLECYSTECTOMY WITH INTRAOPERATIVE CHOLANGIOGRAM;  Surgeon: Gayland Curry, MD,FACS;  Location: [facility];  Service: General;  Laterality: N/A;  Laparoscopic Cholecystectomy with Intraoperative Cholangiogram  . COLONOSCOPY    . ESOPHAGOSCOPY N/A 05/29/2017   Procedure: ESOPHAGOSCOPY;  Surgeon: Clarene Essex, MD;  Location: [facility];  Service: Endoscopy;  Laterality: N/A;  botox injection  . HAND SURGERY Left    wrist  . LEFT HEART CATH AND CORONARY ANGIOGRAPHY N/A 10/11/2017   Procedure: LEFT HEART  CATH AND CORONARY ANGIOGRAPHY;  Surgeon: Martinique, Peter M, MD;  Location: [facility];  Service: Cardiovascular;  Laterality: N/A;  . TONSILECTOMY, ADENOIDECTOMY, BILATERAL MYRINGOTOMY AND TUBES    . TUBAL LIGATION     Current Outpatient Medications on File Prior to Visit  Medication Sig Dispense Refill  . apixaban (ELIQUIS) 5 MG TABS tablet Take 1 tablet (5 mg total) by mouth 2 (two) times daily. 60 tablet 0  . ascorbic acid (VITAMIN C) 500 MG tablet Take 500 mg by mouth daily.    Marland Kitchen atorvastatin (LIPITOR) 40 MG tablet TAKE 1 TABLET BY MOUTH EVERYDAY AT BEDTIME (Patient taking differently: Take 40 mg by mouth at bedtime. ) 90 tablet 1  . cholecalciferol (VITAMIN D3) 25 MCG (1000 UNIT) tablet Take 1,000 Units by mouth daily.    . Cyanocobalamin (B-12) 2500 MCG TABS Take 2,500 mcg by mouth daily.    . diazepam (VALIUM) 5 MG tablet Take 5 mg by mouth daily as needed for anxiety.   3  . metoprolol succinate (TOPROL-XL) 25 MG 24 hr tablet Take 0.5 tablets (12.5 mg total) by mouth daily. 90 tablet 3  . omeprazole (PRILOSEC) 20 MG capsule Take 20 mg by mouth daily.    . traZODone (DESYREL) 50 MG tablet Take 0.5-1 tablets (25-50 mg total) by mouth at bedtime as needed for sleep. [place]  tablet 3  . zinc gluconate 50 MG tablet Take 50 mg by mouth daily.     No current facility-administered medications on file prior to visit.   Allergies  Allergen Reactions  . Penicillins Anaphylaxis, Itching, Swelling and Rash    Has patient had a PCN reaction causing immediate rash, facial/tongue/throat swelling, SOB or lightheadedness with hypotension: Yes Has patient had a PCN reaction causing severe rash involving mucus membranes or skin necrosis: No Has patient had a PCN reaction that required hospitalization: Yes Has patient had a PCN reaction occurring within the last 10 years: No If all of the above answers are "NO", then may proceed with Cephalosporin use.   . Cefaclor Itching, Swelling and Rash  .  Latex Itching and Rash  . Metronidazole Itching, Swelling and Rash  . Naproxen Itching, Swelling and Rash  . Nsaids Itching, Swelling and Rash    Ibuprofen IS tolerated  . Septra [Bactrim] Itching, Swelling and Rash  . Sulfonamide Derivatives Itching, Swelling and Rash  . Tape Itching and Rash   Social History   Socioeconomic History  . Marital status: Married    Spouse name: Not on file  . Number of children: 1  . Years of education: Not on file  . Highest education level: Not on file  Occupational History  . Occupation: retired Clinical cytogeneticist: RETIRED  Tobacco Use  . Smoking status: Former Smoker    Packs/day: 0.50    Years: 40.00    Pack years: 20.00    Types: Cigarettes    Quit date: 02/13/2011    Years since quitting: 8.4  . Smokeless tobacco: Never Used  Substance and Sexual Activity  . Alcohol use: No  . Drug use: No  . Sexual activity: Not on file  Other Topics Concern  . Not on file  Social History Narrative  . Not on file   Social Determinants of Health   Financial Resource Strain:   . Difficulty of Paying Living Expenses: Not on file  Food Insecurity:   . Worried About Charity fundraiser in the Last Year: Not on file  . Ran Out of Food in the Last Year: Not on file  Transportation Needs:   . Lack of Transportation (Medical): Not on file  . Lack of Transportation (Non-Medical): Not on file  Physical Activity:   . Days of Exercise per Week: Not on file  . Minutes of Exercise per Session: Not on file  Stress:   . Feeling of Stress : Not on file  Social Connections:   . Frequency of Communication with Friends and Family: Not on file  . Frequency of Social Gatherings with Friends and Family: Not on file  . Attends Religious Services: Not on file  . Active Member of Clubs or Organizations: Not on file  . Attends Archivist Meetings: Not on file  . Marital Status: Not on file  Intimate Partner Violence:   . Fear of Current or  Ex-Partner: Not on file  . Emotionally Abused: Not on file  . Physically Abused: Not on file  . Sexually Abused: Not on file     Review of Systems  All other systems reviewed and are negative.      Objective:   Physical Exam        Assessment & Plan:  High priority for COVID-19 virus vaccination  I spent more than 10 minutes today with the patient explaining why I feel that she is  a high priority for the COVID-19 vaccination both due to her atrial fibrillation and her age.  I believe that she would be at high risk for complications were she to acquire the infection.  I also explained to the patient that it is likely that eventually she would be exposed to the virus.  Therefore I strongly recommend the vaccine.  I explained the mechanism of action of the vaccine and how the vaccine should not and cannot make her sick or give her Covid.  I did explain that there is always the risk of an allergic reaction however these can be medically managed whenever she receives the vaccine with Benadryl or if necessary an EpiPen.  Therefore I would strongly recommend the vaccine for this individual.  I answered all her questions as best I could.

## 2019-08-07 ENCOUNTER — Ambulatory Visit: Payer: Medicare Other | Attending: Internal Medicine

## 2019-08-07 DIAGNOSIS — Z23 Encounter for immunization: Secondary | ICD-10-CM | POA: Insufficient documentation

## 2019-08-07 NOTE — Progress Notes (Signed)
   Covid-19 Vaccination Clinic  Name:  Brittany Kirk    MRN: [id number] DOB: 1945/11/21  08/07/2019  [name] was observed post Covid-19 immunization for 30 minutes based on pre-vaccination screening without incidence. She was provided with Vaccine Information Sheet and instruction to access the V-Safe system.   [name] was instructed to call 911 with any severe reactions post vaccine: Marland Kitchen Difficulty breathing  . Swelling of your face and throat  . A fast heartbeat  . A bad rash all over your body  . Dizziness and weakness    Immunizations Administered    Name Date Dose VIS Date Route   Pfizer COVID-19 Vaccine 08/07/2019 11:06 AM 0.3 mL 05/22/2019 Intramuscular   Manufacturer: [place]   Lot: [id number]   [place]: [id number]

## 2019-08-10 ENCOUNTER — Ambulatory Visit: Payer: Medicare Other

## 2019-08-10 ENCOUNTER — Telehealth: Payer: Self-pay | Admitting: Cardiovascular Disease

## 2019-08-10 MED ORDER — APIXABAN 5 MG PO TABS: 5.0000 mg | ORAL_TABLET | Freq: Two times a day (BID) | ORAL | 3 refills | Status: DC

## 2019-08-10 NOTE — Telephone Encounter (Signed)
Called and let pt know we refilled prescription

## 2019-08-10 NOTE — Telephone Encounter (Signed)
New message   Patient needs a new prescription for  apixaban (ELIQUIS) 5 MG TABS tablet sent to [id number] [place], [place] - [address]

## 2019-09-01 ENCOUNTER — Ambulatory Visit: Payer: Medicare Other | Attending: Internal Medicine

## 2019-09-01 DIAGNOSIS — Z23 Encounter for immunization: Secondary | ICD-10-CM

## 2019-09-01 NOTE — Progress Notes (Signed)
   Covid-19 Vaccination Clinic  Name:  Brittany Kirk    MRN: [id number] DOB: 08/20/1945  09/01/2019  Ms. Flow was observed post Covid-19 immunization for 30 minutes based on pre-vaccination screening without incident. She was provided with Vaccine Information Sheet and instruction to access the V-Safe system.   [name] was instructed to call 911 with any severe reactions post vaccine: Marland Kitchen Difficulty breathing  . Swelling of face and throat  . A fast heartbeat  . A bad rash all over body  . Dizziness and weakness   Immunizations Administered    Name Date Dose VIS Date Route   Pfizer COVID-19 Vaccine 09/01/2019  1:36 PM 0.3 mL 05/22/2019 Intramuscular   Manufacturer: [place]   Lot: [id number]   [place]: [id number]

## 2019-09-09 ENCOUNTER — Other Ambulatory Visit: Payer: Self-pay | Admitting: Family Medicine

## 2019-09-29 ENCOUNTER — Other Ambulatory Visit: Payer: Self-pay

## 2019-09-29 ENCOUNTER — Ambulatory Visit (INDEPENDENT_AMBULATORY_CARE_PROVIDER_SITE_OTHER): Payer: Medicare Other | Admitting: Family Medicine

## 2019-09-29 ENCOUNTER — Encounter: Payer: Self-pay | Admitting: Family Medicine

## 2019-09-29 VITALS — BP 120/60 | HR 56 | Temp 96.9°F | Resp 14 | Ht 61.0 in | Wt 119.0 lb

## 2019-09-29 DIAGNOSIS — R5382 Chronic fatigue, unspecified: Secondary | ICD-10-CM | POA: Diagnosis not present

## 2019-09-29 DIAGNOSIS — I2510 Atherosclerotic heart disease of native coronary artery without angina pectoris: Secondary | ICD-10-CM

## 2019-09-29 DIAGNOSIS — I480 Paroxysmal atrial fibrillation: Secondary | ICD-10-CM

## 2019-09-29 MED ORDER — ATORVASTATIN CALCIUM 40 MG PO TABS: ORAL_TABLET | ORAL | 2 refills | Status: DC

## 2019-09-29 NOTE — Progress Notes (Signed)
Subjective:    Patient ID: Brittany Kirk, female    DOB: [birth year], 74 y.o.   MRN: [id number]  HPI  07/20/19 Patient was recently admitted to the hospital with an irregular heart rate and was found to be in atrial fibrillation with rapid ventricular response.  She was started on Eliquis 5 mg twice daily.  She is also on metoprolol 12.5 mg twice daily.  She is here today for follow-up.  Thyroid test in the hospital was normal.  However the patient has been only taking her metoprolol in the morning due to bradycardia in the evening.  She states that she will take 12.5 mg in the morning however in the afternoon her heart rate is typically in the 40s and therefore she will not take the evening dose of metoprolol.  She has occasionally felt her heart skip like it is gone out of rhythm but extinguish on its iron.  Today she is in normal sinus rhythm with a heart rate around 60 bpm on my exam.  She denies any syncope or near syncope or chest pain or shortness of breath or dyspnea on exertion.  She denies any bleeding or bruising since she started taking Eliquis.  She denies any strokelike symptoms.  At that time, my plan was: Spent 30 minutes today with the patient discussing the natural history of atrial fibrillation.  She is currently in normal sinus rhythm.  I recommended that she change her metoprolol to metoprolol succinate 25 mg tablets, one half a tablet daily.  This way she will take the medicine spread out every 24 hours.  Rather than give all 12.5 mg in the 12-hour dose and then nothing for 12 hours.  Monitor for bradycardia.  If she does develop bradycardia we will need to discontinue metoprolol altogether.  Continue the Eliquis for stroke prevention.  We discussed the fact that the stroke risk is similar whether she is constantly in atrial fibrillation versus having episodes of atrial fibrillation.  We discussed warning signs such as shortness of breath or chest pain or palpitations and when to seek  medical attention.    09/29/19 Patient reports severe fatigue.  She states that she has no energy.  She feels tired all the time.  At times she feels lightheaded like she may pass out.  Patient has a history of paroxysmal atrial fibrillation but is currently in normal sinus rhythm today.  She states that her heart rate at home is typically between 40 and 50 bpm.  She also reports blood pressures between 100-110/50-60.  She denies any strokelike symptoms.  She denies any syncope or near syncope.  She denies any sudden weight gain or sudden weight loss.  She denies any fevers or chills.  She denies any melena or hematochezia.  She denies any cough or chest pain or orthopnea or paroxysmal nocturnal dyspnea.  She does have a history of known coronary artery disease that is being managed medically.  She is not taking her Lipitor.  She also has a small seborrheic keratosis on the left side of her neck is approximately 3 mm in diameter.  She wanted me to check her ears today and these are completely clear. Past Medical History:  Diagnosis Date  . Allergic rhinitis   . Anxiety   . Asymptomatic bilateral carotid artery stenosis    40-59% (02/2018)  . Atrial fibrillation ([place]) 07/2019  . Breast cancer ([place]) 2015   Right Breast Cancer  . CAD (coronary artery disease)   .  Depression   . Emphysema    no longer uses inhalers  . Gallstones    nausea, pain upper right abdomen  . GERD (gastroesophageal reflux disease)   . H/O hiatal hernia   . Hiatal hernia   . History of skin cancer   . Hyperlipidemia   . Hypertension   . Multinodular goiter (nontoxic)   . Scoliosis   . Tobacco user   . Wears dentures    top   Past Surgical History:  Procedure Laterality Date  . BREAST LUMPECTOMY Right 2015  . CHOLECYSTECTOMY  07/08/2012   Procedure: LAPAROSCOPIC CHOLECYSTECTOMY WITH INTRAOPERATIVE CHOLANGIOGRAM;  Surgeon: Gayland Curry, MD,FACS;  Location: [facility];  Service: General;  Laterality: N/A;  Laparoscopic  Cholecystectomy with Intraoperative Cholangiogram  . COLONOSCOPY    . ESOPHAGOSCOPY N/A 05/29/2017   Procedure: ESOPHAGOSCOPY;  Surgeon: Clarene Essex, MD;  Location: [facility];  Service: Endoscopy;  Laterality: N/A;  botox injection  . HAND SURGERY Left    wrist  . LEFT HEART CATH AND CORONARY ANGIOGRAPHY N/A 10/11/2017   Procedure: LEFT HEART CATH AND CORONARY ANGIOGRAPHY;  Surgeon: Martinique, Peter M, MD;  Location: [facility];  Service: Cardiovascular;  Laterality: N/A;  . TONSILECTOMY, ADENOIDECTOMY, BILATERAL MYRINGOTOMY AND TUBES    . TUBAL LIGATION     Current Outpatient Medications on File Prior to Visit  Medication Sig Dispense Refill  . ascorbic acid (VITAMIN C) 500 MG tablet Take 500 mg by mouth daily.    . cholecalciferol (VITAMIN D3) 25 MCG (1000 UNIT) tablet Take 1,000 Units by mouth daily.    . Cyanocobalamin (B-12) 2500 MCG TABS Take 2,500 mcg by mouth daily.    . diazepam (VALIUM) 5 MG tablet Take 5 mg by mouth daily as needed for anxiety.   3  . metoprolol succinate (TOPROL-XL) 25 MG 24 hr tablet Take 0.5 tablets (12.5 mg total) by mouth daily. 90 tablet 3  . omeprazole (PRILOSEC) 20 MG capsule Take 20 mg by mouth daily.    . traZODone (DESYREL) 50 MG tablet TAKE 1/2 TO 1 TABLET BY MOUTH AT BEDTIME AS NEEDED FOR SLEEP 90 tablet 3  . zinc gluconate 50 MG tablet Take 50 mg by mouth daily.    Marland Kitchen apixaban (ELIQUIS) 5 MG TABS tablet Take 1 tablet (5 mg total) by mouth 2 (two) times daily. 60 tablet 3   No current facility-administered medications on file prior to visit.   Allergies  Allergen Reactions  . Penicillins Anaphylaxis, Itching, Swelling and Rash    Has patient had a PCN reaction causing immediate rash, facial/tongue/throat swelling, SOB or lightheadedness with hypotension: Yes Has patient had a PCN reaction causing severe rash involving mucus membranes or skin necrosis: No Has patient had a PCN reaction that required hospitalization: Yes Has patient had a PCN  reaction occurring within the last 10 years: No If all of the above answers are "NO", then may proceed with Cephalosporin use.   . Cefaclor Itching, Swelling and Rash  . Latex Itching and Rash  . Metronidazole Itching, Swelling and Rash  . Naproxen Itching, Swelling and Rash  . Nsaids Itching, Swelling and Rash    Ibuprofen IS tolerated  . Septra [Bactrim] Itching, Swelling and Rash  . Sulfonamide Derivatives Itching, Swelling and Rash  . Tape Itching and Rash   Social History   Socioeconomic History  . Marital status: Married    Spouse name: Not on file  . Number of children: 1  . Years of  education: Not on file  . Highest education level: Not on file  Occupational History  . Occupation: retired Clinical cytogeneticist: RETIRED  Tobacco Use  . Smoking status: Former Smoker    Packs/day: 0.50    Years: 40.00    Pack years: 20.00    Types: Cigarettes    Quit date: 02/13/2011    Years since quitting: 8.6  . Smokeless tobacco: Never Used  Substance and Sexual Activity  . Alcohol use: No  . Drug use: No  . Sexual activity: Not on file  Other Topics Concern  . Not on file  Social History Narrative  . Not on file   Social Determinants of Health   Financial Resource Strain:   . Difficulty of Paying Living Expenses:   Food Insecurity:   . Worried About Charity fundraiser in the Last Year:   . Arboriculturist in the Last Year:   Transportation Needs:   . Film/video editor (Medical):   Marland Kitchen Lack of Transportation (Non-Medical):   Physical Activity:   . Days of Exercise per Week:   . Minutes of Exercise per Session:   Stress:   . Feeling of Stress :   Social Connections:   . Frequency of Communication with Friends and Family:   . Frequency of Social Gatherings with Friends and Family:   . Attends Religious Services:   . Active Member of Clubs or Organizations:   . Attends Archivist Meetings:   Marland Kitchen Marital Status:   Intimate Partner Violence:   .  Fear of Current or Ex-Partner:   . Emotionally Abused:   Marland Kitchen Physically Abused:   . Sexually Abused:      Review of Systems  All other systems reviewed and are negative.      Objective:   Physical Exam Vitals reviewed.  Constitutional:      General: She is not in acute distress.    Appearance: She is well-developed. She is not diaphoretic.  HENT:     Head: Normocephalic and atraumatic.     Right Ear: External ear normal.     Left Ear: External ear normal.     Mouth/Throat:     Pharynx: No oropharyngeal exudate.  Eyes:     Conjunctiva/sclera: Conjunctivae normal.     Pupils: Pupils are equal, round, and reactive to light.  Neck:     Thyroid: No thyromegaly.     Vascular: No JVD.  Cardiovascular:     Rate and Rhythm: Normal rate and regular rhythm.     Heart sounds: Normal heart sounds. No murmur. No friction rub. No gallop.   Pulmonary:     Effort: Pulmonary effort is normal. No respiratory distress.     Breath sounds: Normal breath sounds. No stridor. No wheezing or rales.  Abdominal:     General: Bowel sounds are normal. There is no distension.     Palpations: Abdomen is soft.     Tenderness: There is no abdominal tenderness. There is no guarding.  Musculoskeletal:     Cervical back: Normal range of motion.  Lymphadenopathy:     Cervical: No cervical adenopathy.  Neurological:     Mental Status: She is alert and oriented to person, place, and time.     Cranial Nerves: No cranial nerve deficit.     Sensory: No sensory deficit.     Motor: Tremor present. No atrophy, abnormal muscle tone or seizure activity.     Deep  Tendon Reflexes: Reflexes are normal and symmetric.           Assessment & Plan:  Chronic fatigue - Plan: CBC with Differential/Platelet, COMPLETE METABOLIC PANEL WITH GFR, TSH  Paroxysmal atrial fibrillation (HCC)  ASCVD (arteriosclerotic cardiovascular disease)  I believe the patient's fatigue may possibly be related to bradycardia and  hypotension.  Therefore of asked the patient to discontinue metoprolol and reassess in 2 weeks.  Meanwhile I will check a CBC, CMP, and TSH.  I encouraged the patient to resume her Lipitor given her known coronary artery disease.

## 2019-09-30 LAB — COMPLETE METABOLIC PANEL WITH GFR
AG Ratio: 1.5 (calc) (ref 1.0–2.5)
ALT: 8 U/L (ref 6–29)
AST: 13 U/L (ref 10–35)
Albumin: 3.7 g/dL (ref 3.6–5.1)
Alkaline phosphatase (APISO): 62 U/L (ref 37–153)
BUN: 22 mg/dL (ref 7–25)
CO2: 25 mmol/L (ref 20–32)
Calcium: 9 mg/dL (ref 8.6–10.4)
Chloride: 106 mmol/L (ref 98–110)
Creat: 0.84 mg/dL (ref 0.60–0.93)
GFR, Est African American: 79 mL/min/{1.73_m2} (ref >=60)
GFR, Est Non African American: 68 mL/min/{1.73_m2} (ref >=60)
Globulin: 2.4 g/dL (calc) (ref 1.9–3.7)
Glucose, Bld: 90 mg/dL (ref 65–99)
Potassium: 4.6 mmol/L (ref 3.5–5.3)
Sodium: 140 mmol/L (ref 135–146)
Total Bilirubin: 0.5 mg/dL (ref 0.2–1.2)
Total Protein: 6.1 g/dL (ref 6.1–8.1)

## 2019-09-30 LAB — CBC WITH DIFFERENTIAL/PLATELET
Absolute Monocytes: 470 cells/uL (ref 200–950)
Basophils Absolute: 31 cells/uL (ref 0–200)
Basophils Relative: 0.4 %
Eosinophils Absolute: 23 cells/uL (ref 15–500)
Eosinophils Relative: 0.3 %
HCT: 37.7 % (ref 35.0–45.0)
Hemoglobin: 12.8 g/dL (ref 11.7–15.5)
Lymphs Abs: 2225 cells/uL (ref 850–3900)
MCH: 33.2 pg — ABNORMAL HIGH (ref 27.0–33.0)
MCHC: 34 g/dL (ref 32.0–36.0)
MCV: 97.9 fL (ref 80.0–100.0)
MPV: 10.2 fL (ref 7.5–12.5)
Monocytes Relative: 6.1 %
Neutro Abs: 4951 cells/uL (ref 1500–7800)
Neutrophils Relative %: 64.3 %
Platelets: 242 10*3/uL (ref 140–400)
RBC: 3.85 10*6/uL (ref 3.80–5.10)
RDW: 12.8 % (ref 11.0–15.0)
Total Lymphocyte: 28.9 %
WBC: 7.7 10*3/uL (ref 3.8–10.8)

## 2019-09-30 LAB — TSH: TSH: 0.65 mIU/L (ref 0.40–4.50)

## 2019-10-06 ENCOUNTER — Other Ambulatory Visit: Payer: Self-pay | Admitting: Family Medicine

## 2019-10-06 ENCOUNTER — Telehealth: Payer: Self-pay | Admitting: Family Medicine

## 2019-10-06 DIAGNOSIS — G250 Essential tremor: Secondary | ICD-10-CM

## 2019-10-06 DIAGNOSIS — I2510 Atherosclerotic heart disease of native coronary artery without angina pectoris: Secondary | ICD-10-CM

## 2019-10-06 DIAGNOSIS — I7300 Raynaud's syndrome without gangrene: Secondary | ICD-10-CM

## 2019-10-06 DIAGNOSIS — I480 Paroxysmal atrial fibrillation: Secondary | ICD-10-CM

## 2019-10-06 NOTE — Progress Notes (Signed)
°  Chronic Care Management   Note  10/06/2019 Name: Brittany Kirk MRN: [id number] DOB: [birth year]  Brittany Kirk is a 74 y.o. year old female who is a primary care patient of Pickard, Cammie Mcgee, MD. I reached out to [name] by phone today in response to a referral sent by [name]'s PCP, Susy Frizzle, MD.   Brittany Kirk was given information about Chronic Care Management services today including:  1. CCM service includes personalized support from designated clinical staff supervised by her physician, including individualized plan of care and coordination with other care providers 2. 24/7 contact phone numbers for assistance for urgent and routine care needs. 3. Service will only be billed when office clinical staff spend 20 minutes or more in a month to coordinate care. 4. Only one practitioner may furnish and bill the service in a calendar month. 5. The patient may stop CCM services at any time (effective at the end of the month) by phone call to the office staff.   Patient agreed to services and verbal consent obtained.   Follow up plan:   [place]

## 2019-10-07 NOTE — Chronic Care Management (AMB) (Addendum)
Chronic Care Management Pharmacy  Name: Brittany Kirk  MRN: [id number] DOB: 1945/07/17  Chief Complaint/ HPI  Brittany Kirk,  74 y.o. , female presents for their Initial CCM visit with the clinical pharmacist via telephone.  PCP : Susy Frizzle, MD  Their chronic conditions include: Afib, hypertension,GERD, dyslipidemia.  Office Visits:  09/29/19([place]) - chronic fatigue, possible d/y bradycardia/hypotension, d/c metoprolol and will reassess in 2 weeks.  Encouraged patient to resume Lipitor due to CAD.  07/20/19 ([facility]) - changed metoprolol to metoprolol succinate 25mg  patient was to take half tablet every 12 hours  Consult Visit:  07/22/19 Brittany Kirk, Cardio) - hospital follow up 07/15/19 Brittany Kirk, ED) - presented with chest pain, determined to be in afib.  Started Eliquis 5mg  bid and metoprolol tartrate 12.5mg  bid as directed  Medications: Outpatient Encounter Medications as of 10/09/2019  Medication Sig   apixaban (ELIQUIS) 5 MG TABS tablet Take 1 tablet (5 mg total) by mouth 2 (two) times daily.   ascorbic acid (VITAMIN C) 500 MG tablet Take 500 mg by mouth daily.   atorvastatin (LIPITOR) 40 MG tablet TAKE 1 TABLET BY MOUTH EVERYDAY AT BEDTIME   cholecalciferol (VITAMIN D3) 25 MCG (1000 UNIT) tablet Take 1,000 Units by mouth daily.   Cyanocobalamin (B-12) 2500 MCG TABS Take 2,500 mcg by mouth daily.   diazepam (VALIUM) 5 MG tablet Take 5 mg by mouth daily as needed for anxiety.    omeprazole (PRILOSEC) 20 MG capsule Take 20 mg by mouth daily.   traZODone (DESYREL) 50 MG tablet TAKE 1/2 TO 1 TABLET BY MOUTH AT BEDTIME AS NEEDED FOR SLEEP   zinc gluconate 50 MG tablet Take 50 mg by mouth daily.   metoprolol succinate (TOPROL-XL) 25 MG 24 hr tablet Take 0.5 tablets (12.5 mg total) by mouth daily. (Patient not taking: Reported on 10/09/2019)   No facility-administered encounter medications on file as of 10/09/2019.     Current Diagnosis/Assessment:  Goals Addressed              This Visit's Progress    CAD       CARE PLAN ENTRY  Current Barriers:  Chronic Disease Management support, education, and care coordination needs related to atrial fibrilation.  Pharmacist Clinical Goal(s):  Over the next 30 days patient will work with PharmD to optimize medication regimen related to atrial fibrilation.  Interventions: Comprehensive medication review performed. Patient instructed to report any abnormal bruising/bleeding to provider.  Patient Self Care Activities:  Patient verbalizes understanding of plan as described above and Calls provider office for new concerns or questions Over the next 30 days patient will focus on medication adherence by using a pill box.  Initial goal documentation      Dyslipidemia - LDL <70       CARE PLAN ENTRY (see longitudinal plan of care for additional care plan information)  Current Barriers:  Controlled hyperlipidemia, complicated by CAD, hypertension, and GERD. Current antihyperlipidemic regimen: atorvastatin 40mg  daily Previous antihyperlipidemic medications tried: none noted Most recent lipid panel:     Component Value Date/Time   CHOL 133 07/15/2019 0934   CHOL 124 02/27/2017 0940   TRIG 48 07/15/2019 0934   HDL 59 07/15/2019 0934   HDL 58 02/27/2017 0940   CHOLHDL 2.3 07/15/2019 0934   VLDL 10 07/15/2019 0934   LDLCALC 64 07/15/2019 0934   LDLCALC 61 07/14/2018 1149   ASCVD risk enhancing conditions: age >3, DM, HTN, CKD, CHF, current smoker 10-year ASCVD risk score: 18.1%  Pharmacist Clinical Goal(s):  Over the next 30 days, patient will work with PharmD and providers towards optimized antihyperlipidemic therapy  Interventions: Comprehensive medication review performed; medication list updated in electronic medical record.  Inter-disciplinary care team collaboration (see longitudinal plan of care) Continue current medications.  Patient Self Care Activities:  Patient will focus on  medication adherence over the next 30 days using pill box.  Initial goal documentation      Hypertension - BP < 130/80       CARE PLAN ENTRY (see longitudinal plan of care for additional care plan information)  Current Barriers:  Controlled hypertension, complicated by dyslipidemia, CAD, and GERD. Current antihypertensive regimen: none noted Previous antihypertensives tried: losartan, ramipril Last practice recorded BP readings:  BP Readings from Last 3 Encounters:  09/29/19 120/60  07/22/19 (!) 122/58  07/20/19 128/70   Current home BP readings: ranging from 112/54 to 172/80 at the highest.    Pharmacist Clinical Goal(s):  Over the next 30 days, patient will work with PharmD and providers to optimize antihypertensive regimen  Interventions: Inter-disciplinary care team collaboration (see longitudinal plan of care) Comprehensive medication review performed; medication list updated in the electronic medical record.  Will continue to monitor BP daily as record in log.  Patient will acquire arm cuff to use to monitor BP instead of wrist cuff previously using. Consult with Dr. Dennard Schaumann on appropriate blood pressure therapy due to recent elevations with at home cuff.  Patient Self Care Activities:  Over the next 30 days, patient will continue to check BP at least twice daily , document, and provide at future appointments Patient will focus on medication adherence by using a pill box.  Initial goal documentation          Financial Resource Strain: Low Risk    Difficulty of Paying Living Expenses: Not hard at all     Hypertension   Office blood pressures are  BP Readings from Last 3 Encounters:  09/29/19 120/60  07/22/19 (!) 122/58  07/20/19 128/70    Patient has failed these meds in the past: metoprolol succinate 25mg  (stopped last visit with PCP d/t bradycardia)  Patient checks BP at home multiple times daily.  Patient home BP readings are ranging: From 112/54,  159/77, up to 172/80 (4/26).  Patient reports the high BP's are normally 30 minutes after waking and then usually drops back down a few hours later.  Patient is currently uncontrolled on the following medications: none   We discussed  continuing to monitor BP , contact PCP if BP continues to range be elevated in the mornings after waking up.  She is drinking caffeine but this is usually after she checks her BP so elevation seems unrelated.  Discussed risks of elevated BP with Afib and when to seek medical attention.  Patient switching to arm cuff to monitor instead of current wrist cuff.  Plan Will consult with PCP to determine best course of action for blood pressure.  Current elevations warrant additional therapy such as ACEI/ARB with additional medication for rate control with Afib.    Dyslipidemia   Lipid Panel     Component Value Date/Time   CHOL 133 07/15/2019 0934   CHOL 124 02/27/2017 0940   TRIG 48 07/15/2019 0934   HDL 59 07/15/2019 0934   HDL 58 02/27/2017 0940   CHOLHDL 2.3 07/15/2019 0934   VLDL 10 07/15/2019 0934   LDLCALC 64 07/15/2019 0934   LDLCALC 61 07/14/2018 1149   LABVLDL 17 02/27/2017 0940  The 10-year ASCVD risk score Mikey Bussing DC Jr., et al., 2013) is: 18.1%   Values used to calculate the score:     Age: 7 years     Sex: Female     Is Non-Hispanic African American: No     Diabetic: No     Tobacco smoker: No     Systolic Blood Pressure: 123456 mmHg     Is BP treated: Yes     HDL Cholesterol: 59 mg/dL     Total Cholesterol: 133 mg/dL   Patient has failed these meds in past: no others Patient is currently controlled on the following medications: atorvastatin 40mg  daily  We discussed:   importance of medication adherence and benefits of statins  Plan  Continue current medications  GERD    Patient has failed these meds in past: none noted Patient is currently controlled on the following medications: omeprazole 20mg  daily  Patient had a hiatal  hernia and has been on omeprazole ever since.  Currently asymptomatic but wants to remain on long term.  Discussed dosing in the morning 30 minutes before eating as opposed to in the afternoon as she was previously taking.  Plan  Continue current medications   Atrial Fibrilation    Patient has failed these meds in past: none noted Patient is currently controlled on the following medications: Eliquis 5mg  daily  We discussed:  Patient reported no abnormal bruising or bleeding, denied blood in stool etc.  Educated patient on avoidance of NSAIDs.  Plan  Continue current medications  Vaccines   Reviewed and discussed patient's vaccination history.    Immunization History  Administered Date(s) Administered   Fluad Quad(high Dose 65+) 03/10/2019   Influenza Split 05/04/2013   Influenza Whole 06/11/2006, 06/11/2008, 03/06/2011   Influenza, High Dose Seasonal PF 03/12/2017   Influenza,inj,Quad PF,6+ Mos 06/14/2014, 02/22/2015, 03/08/2016   PFIZER SARS-COV-2 Vaccination 08/07/2019, 09/01/2019   Pneumococcal Conjugate-13 02/22/2015   Pneumococcal Polysaccharide-23 07/14/2013   Tdap 05/19/2012    Plan  Recommended patient receive shingles vaccine in pharmacy. Patient reports high cost but this was 3 years ago.  Patient will check into this next time she is at the pharmacy.  Medication Management   Miscellaneous medications: trazadone 50mg  tablet one-half to one daily for sleep OTC's: zinc 50mg  daily, vitamin b-12 252mcg daily, vitamin d3 74mcg daily, vitamin c 500mg  daily Patient currently uses [facility] [place] [place].  Phone #  [phone] Patient reports using pill box method to organize medications and promote adherence. Patient reports adherence to medications and no missed doses recently. No barriers with cost.   Beverly Milch, PharmD Clinical Pharmacist [place] [phone] I have collaborated with the care management provider regarding  care management and care coordination activities outlined in this encounter and have reviewed this encounter including documentation in the note and care plan. I am certifying that I agree with the content of this note and encounter as supervising physician.

## 2019-10-09 ENCOUNTER — Other Ambulatory Visit: Payer: Self-pay

## 2019-10-09 ENCOUNTER — Ambulatory Visit: Payer: Medicare Other | Admitting: Pharmacist

## 2019-10-09 DIAGNOSIS — I10 Essential (primary) hypertension: Secondary | ICD-10-CM

## 2019-10-09 DIAGNOSIS — E785 Hyperlipidemia, unspecified: Secondary | ICD-10-CM

## 2019-10-09 DIAGNOSIS — I4891 Unspecified atrial fibrillation: Secondary | ICD-10-CM

## 2019-10-09 NOTE — Patient Instructions (Addendum)
Visit Information  Goals Addressed            This Visit's Progress   . CAD       CARE PLAN ENTRY  Current Barriers:  . Chronic Disease Management support, education, and care coordination needs related to atrial fibrilation.  Pharmacist Clinical Goal(s):  Marland Kitchen Over the next 30 days patient will work with PharmD to optimize medication regimen related to atrial fibrilation.  Interventions: . Comprehensive medication review performed. . Patient instructed to report any abnormal bruising/bleeding to provider.  Patient Self Care Activities:  . Patient verbalizes understanding of plan as described above and Calls provider office for new concerns or questions . Over the next 30 days patient will focus on medication adherence by using a pill box.  Initial goal documentation     . Dyslipidemia - LDL <70       CARE PLAN ENTRY (see longitudinal plan of care for additional care plan information)  Current Barriers:  . Controlled hyperlipidemia, complicated by CAD, hypertension, and GERD. . Current antihyperlipidemic regimen: atorvastatin 40mg  daily . Previous antihyperlipidemic medications tried: none noted . Most recent lipid panel:     Component Value Date/Time   CHOL 133 07/15/2019 0934   CHOL 124 02/27/2017 0940   TRIG 48 07/15/2019 0934   HDL 59 07/15/2019 0934   HDL 58 02/27/2017 0940   CHOLHDL 2.3 07/15/2019 0934   VLDL 10 07/15/2019 0934   LDLCALC 64 07/15/2019 0934   LDLCALC 61 07/14/2018 1149   . ASCVD risk enhancing conditions: age >40, DM, HTN, CKD, CHF, current smoker . 10-year ASCVD risk score: 18.1%  Pharmacist Clinical Goal(s):  Marland Kitchen Over the next 30 days, patient will work with PharmD and providers towards optimized antihyperlipidemic therapy  Interventions: . Comprehensive medication review performed; medication list updated in electronic medical record.  Bertram Savin care team collaboration (see longitudinal plan of care) . Continue current  medications.  Patient Self Care Activities:  . Patient will focus on medication adherence over the next 30 days using pill box.  Initial goal documentation     . Hypertension - BP < 130/80         Ms. Pettey was given information about Chronic Care Management services today including:  1. CCM service includes personalized support from designated clinical staff supervised by her physician, including individualized plan of care and coordination with other care providers 2. 24/7 contact phone numbers for assistance for urgent and routine care needs. 3. Standard insurance, coinsurance, copays and deductibles apply for chronic care management only during months in which we provide at least 20 minutes of these services. Most insurances cover these services at 100%, however patients may be responsible for any copay, coinsurance and/or deductible if applicable. This service may help you avoid the need for more expensive face-to-face services. 4. Only one practitioner may furnish and bill the service in a calendar month. 5. The patient may stop CCM services at any time (effective at the end of the month) by phone call to the office staff.  Patient agreed to services and verbal consent obtained.   The patient verbalized understanding of instructions provided today and agreed to receive a mailed copy of patient instruction and/or educational materials. Telephone follow up appointment with pharmacy team member scheduled for: 11/04/2019 @ 12:15 PM  Beverly Milch, PharmD Clinical Pharmacist [facility] [phone]   Atrial Fibrillation  Atrial fibrillation is a type of irregular or rapid heartbeat (arrhythmia). In atrial fibrillation, the top  part of the heart (atria) beats in an irregular pattern. This makes the heart unable to pump blood normally and effectively. The goal of treatment is to prevent blood clots from forming, control your heart rate, or restore your  heartbeat to a normal rhythm. If this condition is not treated, it can cause serious problems, such as a weakened heart muscle (cardiomyopathy) or a stroke. What are the causes? This condition is often caused by medical conditions that damage the heart's electrical system. These include:  High blood pressure (hypertension). This is the most common cause.  Certain heart problems or conditions, such as heart failure, coronary artery disease, heart valve problems, or heart surgery.  Diabetes.  Overactive thyroid (hyperthyroidism).  Obesity.  Chronic kidney disease. In some cases, the cause of this condition is not known. What increases the risk? This condition is more likely to develop in:  Older people.  People who smoke.  Athletes who do endurance exercise.  People who have a family history of atrial fibrillation.  Men.  People who use drugs.  People who drink a lot of alcohol.  People who have lung conditions, such as emphysema, pneumonia, or COPD.  People who have obstructive sleep apnea. What are the signs or symptoms? Symptoms of this condition include:  A feeling that your heart is racing or beating irregularly.  Discomfort or pain in your chest.  Shortness of breath.  Sudden light-headedness or weakness.  Tiring easily during exercise or activity.  Fatigue.  Syncope (fainting).  Sweating. In some cases, there are no symptoms. How is this diagnosed? Your health care provider may detect atrial fibrillation when taking your pulse. If detected, this condition may be diagnosed with:  An electrocardiogram (ECG) to check electrical signals of the heart.  An ambulatory cardiac monitor to record your heart's activity for a few days.  A transthoracic echocardiogram (TTE) to create pictures of your heart.  A transesophageal echocardiogram (TEE) to create even closer pictures of your heart.  A stress test to check your blood supply while you  exercise.  Imaging tests, such as a CT scan or chest X-ray.  Blood tests. How is this treated? Treatment depends on underlying conditions and how you feel when you experience atrial fibrillation. This condition may be treated with:  Medicines to prevent blood clots or to treat heart rate or heart rhythm problems.  Electrical cardioversion to reset the heart's rhythm.  A pacemaker to correct abnormal heart rhythm.  Ablation to remove the heart tissue that sends abnormal signals.  Left atrial appendage closure to seal the area where blood clots can form. In some cases, underlying conditions will be treated. Follow these instructions at home: Medicines  Take over-the counter and prescription medicines only as told by your health care provider.  Do not take any new medicines without talking to your health care provider.  If you are taking blood thinners: ? Talk with your health care provider before you take any medicines that contain aspirin or NSAIDs, such as ibuprofen. These medicines increase your risk for dangerous bleeding. ? Take your medicine exactly as told, at the same time every day. ? Avoid activities that could cause injury or bruising, and follow instructions about how to prevent falls. ? Wear a medical alert bracelet or carry a card that lists what medicines you take. Lifestyle      Do not use any products that contain nicotine or tobacco, such as cigarettes, e-cigarettes, and chewing tobacco. If you need help quitting, ask  your health care provider.  Eat heart-healthy foods. Talk with a dietitian to make an eating plan that is right for you.  Exercise regularly as told by your health care provider.  Do not drink alcohol.  Lose weight if you are overweight.  Do not use drugs, including cannabis. General instructions  If you have obstructive sleep apnea, manage your condition as told by your health care provider.  Do not use diet pills unless your health  care provider approves. Diet pills can make heart problems worse.  Keep all follow-up visits as told by your health care provider. This is important. Contact a health care provider if you:  Notice a change in the rate, rhythm, or strength of your heartbeat.  Are taking a blood thinner and you notice more bruising.  Tire more easily when you exercise or do heavy work.  Have a sudden change in weight. Get help right away if you have:   Chest pain, abdominal pain, sweating, or weakness.  Trouble breathing.  Side effects of blood thinners, such as blood in your vomit, stool, or urine, or bleeding that cannot stop.  Any symptoms of a stroke. "BE FAST" is an easy way to remember the main warning signs of a stroke: ? B - Balance. Signs are dizziness, sudden trouble walking, or loss of balance. ? E - Eyes. Signs are trouble seeing or a sudden change in vision. ? F - Face. Signs are sudden weakness or numbness of the face, or the face or eyelid drooping on one side. ? A - Arms. Signs are weakness or numbness in an arm. This happens suddenly and usually on one side of the body. ? S - Speech. Signs are sudden trouble speaking, slurred speech, or trouble understanding what people say. ? T - Time. Time to call emergency services. Write down what time symptoms started.  Other signs of a stroke, such as: ? A sudden, severe headache with no known cause. ? Nausea or vomiting. ? Seizure. These symptoms may represent a serious problem that is an emergency. Do not wait to see if the symptoms will go away. Get medical help right away. Call your local emergency services (911 in the [place] Do not drive yourself to the hospital. Summary  Atrial fibrillation is a type of irregular or rapid heartbeat (arrhythmia).  Symptoms include a feeling that your heart is beating fast or irregularly.  You may be given medicines to prevent blood clots or to treat heart rate or heart rhythm problems.  Get help  right away if you have signs or symptoms of a stroke.  Get help right away if you cannot catch your breath or have chest pain or pressure. This information is not intended to replace advice given to you by your health care provider. Make sure you discuss any questions you have with your health care provider. Document Revised: 11/19/2018 Document Reviewed: 11/19/2018 Elsevier Patient Education  [place].

## 2019-10-14 DIAGNOSIS — L720 Epidermal cyst: Secondary | ICD-10-CM | POA: Diagnosis not present

## 2019-10-14 DIAGNOSIS — L821 Other seborrheic keratosis: Secondary | ICD-10-CM | POA: Diagnosis not present

## 2019-10-14 DIAGNOSIS — Z85828 Personal history of other malignant neoplasm of skin: Secondary | ICD-10-CM | POA: Diagnosis not present

## 2019-10-14 DIAGNOSIS — D485 Neoplasm of uncertain behavior of skin: Secondary | ICD-10-CM | POA: Diagnosis not present

## 2019-10-14 DIAGNOSIS — D225 Melanocytic nevi of trunk: Secondary | ICD-10-CM | POA: Diagnosis not present

## 2019-10-14 DIAGNOSIS — D1801 Hemangioma of skin and subcutaneous tissue: Secondary | ICD-10-CM | POA: Diagnosis not present

## 2019-10-14 DIAGNOSIS — C44519 Basal cell carcinoma of skin of other part of trunk: Secondary | ICD-10-CM | POA: Diagnosis not present

## 2019-10-20 ENCOUNTER — Ambulatory Visit (INDEPENDENT_AMBULATORY_CARE_PROVIDER_SITE_OTHER): Payer: Medicare Other | Admitting: Cardiovascular Disease

## 2019-10-20 ENCOUNTER — Encounter: Payer: Self-pay | Admitting: Cardiovascular Disease

## 2019-10-20 ENCOUNTER — Other Ambulatory Visit: Payer: Self-pay

## 2019-10-20 DIAGNOSIS — E785 Hyperlipidemia, unspecified: Secondary | ICD-10-CM

## 2019-10-20 DIAGNOSIS — I10 Essential (primary) hypertension: Secondary | ICD-10-CM | POA: Diagnosis not present

## 2019-10-20 DIAGNOSIS — I2510 Atherosclerotic heart disease of native coronary artery without angina pectoris: Secondary | ICD-10-CM | POA: Diagnosis not present

## 2019-10-20 DIAGNOSIS — I6523 Occlusion and stenosis of bilateral carotid arteries: Secondary | ICD-10-CM | POA: Diagnosis not present

## 2019-10-20 DIAGNOSIS — I4891 Unspecified atrial fibrillation: Secondary | ICD-10-CM

## 2019-10-20 NOTE — Assessment & Plan Note (Signed)
History of dyslipidemia on statin therapy with lipid profile performed 07/15/2019 revealing total cholesterol 133, LDL 64 and HDL 59. 

## 2019-10-20 NOTE — Assessment & Plan Note (Signed)
History of CAD status post cardiac catheterization by Dr. Martinique 10/11/2017 revealing normal LV function with 85% ostial ramus branch stenosis apparently unchanged from prior cath in 2010 by Dr. Lia Foyer.  Medical therapy was recommended.  She denies chest pain or shortness of breath.

## 2019-10-20 NOTE — Assessment & Plan Note (Signed)
Recent ER eval by Dr. Harrell Gave 07/15/2019 for A. fib with RVR spontaneously converting to sinus rhythm.  She is on metoprolol.  She has had a few episodes of brief AF but for the most part is asymptomatic from this.  She is on Eliquis oral anticoagulation. The CHA2DSVASC2 score is   4.

## 2019-10-20 NOTE — Assessment & Plan Note (Signed)
History of essential hypertension blood pressure measured today at 122/60.  She is on metoprolol.

## 2019-10-20 NOTE — Patient Instructions (Signed)
Medication Instructions:  NO CHANGE *If you need a refill on your cardiac medications before your next appointment, please call your pharmacy*   Lab Work: If you have labs (blood work) drawn today and your tests are completely normal, you will receive your results only by: Marland Kitchen MyChart Message (if you have MyChart) OR . A paper copy in the mail If you have any lab test that is abnormal or we need to change your treatment, we will call you to review the results.   Testing/Procedures: Your physician has requested that you have a carotid duplex. This test is an ultrasound of the carotid arteries in your neck. It looks at blood flow through these arteries that supply the brain with blood. Allow one hour for this exam. There are no restrictions or special instructions.SCHEDULE IN September 2021     Follow-Up: At [facility], you and your health needs are our priority.  As part of our continuing mission to provide you with exceptional heart care, we have created designated Provider Care Teams.  These Care Teams include your primary Cardiologist (physician) and Advanced Practice Providers (APPs -  Physician Assistants and Nurse Practitioners) who all work together to provide you with the care you need, when you need it.  We recommend signing up for the patient portal called "MyChart".  Sign up information is provided on this After Visit Summary.  MyChart is used to connect with patients for Virtual Visits (Telemedicine).  Patients are able to view lab/test results, encounter notes, upcoming appointments, etc.  Non-urgent messages can be sent to your provider as well.   To learn more about what you can do with MyChart, go to [email or web address].    Your next appointment:    Your physician wants you to follow-up in: [place] NP You will receive a reminder letter in the mail two months in advance. If you don't receive a letter, please call our office to schedule the follow-up  appointment.   Your physician wants you to follow-up in: [place] will receive a reminder letter in the mail two months in advance. If you don't receive a letter, please call our office to schedule the follow-up appointment.

## 2019-10-20 NOTE — Progress Notes (Signed)
10/20/2019 Colin Benton Plata   20-Dec-1945  [id number]  Primary Physician Pickard, Cammie Mcgee, MD Primary Cardiologist: Lorretta Harp MD [name], [place]  HPI:  Brittany Kirk is a 74 y.o.  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 She was referred by Dr. Dennard Schaumann for cardiovascular evaluation because of chest pain. I last spoke to her on the phone for a virtual telemedicine phone visit 04/01/2019. Risk factors include 25 pack years of tobacco abuse having quit 3 years ago. She has treated hypertension as well as a strong family history of heart disease with multiple siblings who died at a premature age of myocardial infarction's. She has never had a heart attack or stroke. She has had a remote cardiac catheterization by Dr. Lia Foyer but no intervention was performed. She was complaining of some substernal chest pain with chronic nausea. This has since some sided after beginning on a proton pump inhibitor.She had a negative stress test 01/24/16 and again during a recent consultation for chest pain 01/05/17.She has had upper and lower endoscopy for abdominal pain and nausea which was unrevealing.  In the office she complains of increasing shortness of breath, substernal chest pain rating to her back and left upper extremity. Her EKG shows no acute changes.Sheclearlyappeareduncomfortable .I had her urgently transported to [facility] by EMS for cardiac catheterization which was performed by Dr. Martinique showing moderately severe ramus branch ostial disease with noncritical disease otherwise a normal LV function. This is unchanged from prior cath performed in 2010 it was thought that her chest pain was noncardiac in nature.  Since I spoke to her on the phone approximately 9 months ago she was seen in the emergency room on 07/15/2019 for A. fib with RVR.  She converted spontaneously to sinus rhythm and was  begun on apixaban by Dr. Harrell Gave.  She has had a few episodes of brief AF since but for the most part is asymptomatic from this.  She denies chest pain or shortness of breath.   Current Meds  Medication Sig  . apixaban (ELIQUIS) 5 MG TABS tablet Take 1 tablet (5 mg total) by mouth 2 (two) times daily.  Marland Kitchen ascorbic acid (VITAMIN C) 500 MG tablet Take 500 mg by mouth daily.  Marland Kitchen atorvastatin (LIPITOR) 40 MG tablet TAKE 1 TABLET BY MOUTH EVERYDAY AT BEDTIME  . cholecalciferol (VITAMIN D3) 25 MCG (1000 UNIT) tablet Take 1,000 Units by mouth daily.  . Cyanocobalamin (B-12) 2500 MCG TABS Take 2,500 mcg by mouth daily.  . diazepam (VALIUM) 5 MG tablet Take 5 mg by mouth daily as needed for anxiety.   . metoprolol succinate (TOPROL-XL) 25 MG 24 hr tablet Take 0.5 tablets (12.5 mg total) by mouth daily.  Marland Kitchen omeprazole (PRILOSEC) 20 MG capsule Take 20 mg by mouth daily.  . traZODone (DESYREL) 50 MG tablet TAKE 1/2 TO 1 TABLET BY MOUTH AT BEDTIME AS NEEDED FOR SLEEP  . zinc gluconate 50 MG tablet Take 50 mg by mouth daily.     Allergies  Allergen Reactions  . Penicillins Anaphylaxis, Itching, Swelling and Rash    Has patient had a PCN reaction causing immediate rash, facial/tongue/throat swelling, SOB or lightheadedness with hypotension: Yes Has patient had a PCN reaction causing severe rash involving mucus membranes or skin necrosis: No Has patient had a PCN reaction that required hospitalization: Yes Has patient had a PCN reaction occurring within the  last 10 years: No If all of the above answers are "NO", then may proceed with Cephalosporin use.   . Cefaclor Itching, Swelling and Rash  . Latex Itching and Rash  . Metronidazole Itching, Swelling and Rash  . Naproxen Itching, Swelling and Rash  . Nsaids Itching, Swelling and Rash    Ibuprofen IS tolerated  . Septra [Bactrim] Itching, Swelling and Rash  . Sulfonamide Derivatives Itching, Swelling and Rash  . Tape Itching and Rash    Social  History   Socioeconomic History  . Marital status: Married    Spouse name: Not on file  . Number of children: 1  . Years of education: Not on file  . Highest education level: Not on file  Occupational History  . Occupation: retired Clinical cytogeneticist: RETIRED  Tobacco Use  . Smoking status: Former Smoker    Packs/day: 0.50    Years: 40.00    Pack years: 20.00    Types: Cigarettes    Quit date: 02/13/2011    Years since quitting: 8.6  . Smokeless tobacco: Never Used  Substance and Sexual Activity  . Alcohol use: No  . Drug use: No  . Sexual activity: Not on file  Other Topics Concern  . Not on file  Social History Narrative  . Not on file   Social Determinants of Health   Financial Resource Strain: Low Risk   . Difficulty of Paying Living Expenses: Not hard at all  Food Insecurity:   . Worried About Charity fundraiser in the Last Year:   . Arboriculturist in the Last Year:   Transportation Needs:   . Film/video editor (Medical):   Marland Kitchen Lack of Transportation (Non-Medical):   Physical Activity:   . Days of Exercise per Week:   . Minutes of Exercise per Session:   Stress:   . Feeling of Stress :   Social Connections:   . Frequency of Communication with Friends and Family:   . Frequency of Social Gatherings with Friends and Family:   . Attends Religious Services:   . Active Member of Clubs or Organizations:   . Attends Archivist Meetings:   Marland Kitchen Marital Status:   Intimate Partner Violence:   . Fear of Current or Ex-Partner:   . Emotionally Abused:   Marland Kitchen Physically Abused:   . Sexually Abused:      Review of Systems: General: negative for chills, fever, night sweats or weight changes.  Cardiovascular: negative for chest pain, dyspnea on exertion, edema, orthopnea, palpitations, paroxysmal nocturnal dyspnea or shortness of breath Dermatological: negative for rash Respiratory: negative for cough or wheezing Urologic: negative for  hematuria Abdominal: negative for nausea, vomiting, diarrhea, bright red blood per rectum, melena, or hematemesis Neurologic: negative for visual changes, syncope, or dizziness All other systems reviewed and are otherwise negative except as noted above.    Blood pressure 122/60, pulse (!) 58, temperature 98 F (36.7 C), height 4\' 11"  (1.499 m), weight 118 lb 9.6 oz (53.8 kg).  General appearance: alert and no distress Neck: no adenopathy, no JVD, supple, symmetrical, trachea midline, thyroid not enlarged, symmetric, no tenderness/mass/nodules and Bilateral carotid bruits Lungs: clear to auscultation bilaterally Heart: regular rate and rhythm, S1, S2 normal, no murmur, click, rub or gallop Extremities: extremities normal, atraumatic, no cyanosis or edema Pulses: 2+ and symmetric Skin: Skin color, texture, turgor normal. No rashes or lesions Neurologic: Alert and oriented X 3, normal strength and tone.  Normal symmetric reflexes. Normal coordination and gait  EKG not performed today  ASSESSMENT AND PLAN:   CAD in native artery History of CAD status post cardiac catheterization by Dr. Martinique 10/11/2017 revealing normal LV function with 85% ostial ramus branch stenosis apparently unchanged from prior cath in 2010 by Dr. Lia Foyer.  Medical therapy was recommended.  She denies chest pain or shortness of breath.  Dyslipidemia, goal LDL below 70 History of dyslipidemia on statin therapy with lipid profile performed 07/15/2019 revealing total cholesterol 133, LDL 64 and HDL 59.  Essential hypertension History of essential hypertension blood pressure measured today at 122/60.  She is on metoprolol.  Asymptomatic bilateral carotid artery stenosis History of carotid artery disease with Doppler performed 02/19/2019 revealing moderately severe right and moderate left ICA stenosis.  She is neurologically asymptomatic.  We will repeat her studies this coming September.  Atrial fibrillation with RVR  (HCC) Recent ER eval by Dr. Harrell Gave 07/15/2019 for A. fib with RVR spontaneously converting to sinus rhythm.  She is on metoprolol.  She has had a few episodes of brief AF but for the most part is asymptomatic from this.  She is on Eliquis oral anticoagulation. The CHA2DSVASC2 score is   4.      Lorretta Harp MD FACP,FACC,FAHA, [facility] 10/20/2019 2:29 PM

## 2019-10-20 NOTE — Assessment & Plan Note (Addendum)
History of carotid artery disease with Doppler performed 02/19/2019 revealing moderately severe right and moderate left ICA stenosis.  She is neurologically asymptomatic.  We will repeat her studies this coming September.

## 2019-11-04 ENCOUNTER — Ambulatory Visit: Payer: Self-pay | Admitting: Pharmacist

## 2019-11-04 NOTE — Patient Instructions (Addendum)
Visit Information  Goals Addressed            This Visit's Progress   . CAD       CARE PLAN ENTRY  Current Barriers:  . Chronic Disease Management support, education, and care coordination needs related to atrial fibrilation.  Pharmacist Clinical Goal(s):  Marland Kitchen Over the next 180 days patient will work with PharmD to optimize medication regimen related to atrial fibrilation.  Interventions: . Comprehensive medication review performed. . Patient instructed to report any abnormal bruising/bleeding to provider.  Patient Self Care Activities:  . Patient verbalizes understanding of plan as described above and Calls provider office for new concerns or questions . Over the next 30 days patient will focus on medication adherence by using a pill box.  Initial goal documentation     . Hypertension - BP < 130/80       CARE PLAN ENTRY (see longitudinal plan of care for additional care plan information)  Current Barriers:  . Controlled hypertension, complicated by dyslipidemia, CAD, and GERD. . Current antihypertensive regimen: none noted . Previous antihypertensives tried: losartan, ramipril . Last practice recorded BP readings:  BP Readings from Last 3 Encounters:  09/29/19 120/60  07/22/19 (!) 122/58  07/20/19 128/70   . Current home BP readings: 88/35 to 131/58 with most being at normal range.  Pharmacist Clinical Goal(s):  Marland Kitchen Over the next 180 days, patient will work with PharmD and providers to optimize antihypertensive regimen  Interventions: . Inter-disciplinary care team collaboration (see longitudinal plan of care) . Comprehensive medication review performed; medication list updated in the electronic medical record.  . Will continue to monitor BP daily as record in log.  Patient Self Care Activities:  . Over the next 30 days, patient will continue to check BP at least twice daily , document, and provide at future appointments . Patient will focus on medication adherence  by using a pill box.  Please see past updates related to this goal by clicking on the "Past Updates" button in the selected goal         The patient verbalized understanding of instructions provided today and agreed to receive a mailed copy of patient instruction and/or educational materials.  Telephone follow up appointment with pharmacy team member scheduled for: 05/04/2020 @ 4:30 PM Beverly Milch, PharmD Clinical Pharmacist [facility] [phone]   Preventing Atrial Fibrillation-Related Stroke  Atrial fibrillation is a common type of irregular or rapid heartbeat (arrhythmia) that greatly increases your risk for a stroke. In atrial fibrillation, the top portions of the heart (atria) beat out of sync with the lower portions of the heart. When the muscles of the atria are tightening in an uncoordinated way (fibrillating), blood can pool in the heart and form clots. If a clot travels to the brain, it can cause a stroke. This type of stroke is preventable. Understanding atrial fibrillation and knowing how to properly manage it can prevent you from having a stroke. What increases my risk for a stroke? If you have atrial fibrillation, you may be at increased risk for a stroke if you also:  Have heart failure.  Have high blood pressure.  Are older than age 64.  Have diabetes.  Have a history of vascular disease, such as heart attack or stroke.  Are female. If you have atrial fibrillation and you also have one or more of those risk factors, talk with your health care provider about treatments that can prevent a stroke. Other risk factors  for a stroke include:  Smoking.  High cholesterol.  Diabetes.  Being inactive (sedentary lifestyle).  Having a family history of stroke.  Eating a diet that is high in fat, cholesterol, and salt. What treatments help to manage atrial fibrillation? The main goals of treatment for atrial fibrillation are to prevent  blood clots from forming and to keep your heart beating at a normal rate and rhythm. Treatment may include:  Blood-thinning medicine (anticoagulant) that helps to prevent clots from forming. This medicine also increases the risk of bleeding. Talk with your health care provider about the risks and benefits of taking anticoagulants.  Medicine that slows the heart rate or brings the heart rhythm back to normal.  Electrical cardioversion. This is a procedure that resets the heart's rhythm by delivering a controlled, low-energy shock through your skin to your heart.  An ablation procedure, such as catheter ablation, catheter ablation with pacemaker, or surgical ablation. These procedures destroy the heart tissues that send abnormal signals so that heart rhythms can be improved or made normal. A pacemaker is a device that is placed under the skin to help the heart beat in a regular rhythm. How can I prevent atrial fibrillation-related stroke? Medicines  Take over-the-counter and prescription medicines only as told by your health care provider.  If your health care provider prescribed an anticoagulant, take it exactly as told. Taking too much blood-thinning medicine can cause bleeding. If you do not take enough blood-thinning medicine, you will not have the protection that you need against stroke and other problems. Eating and drinking  Eat healthy foods, including at least 5 servings of fruits and vegetables a day.  Do not drink alcohol.  Do not drink beverages that contain caffeine, such as coffee, soda, and tea.  Follow dietary instructions as told by your health care provider. Managing other medical conditions  Manage and be aware of your blood pressure. If you have high blood pressure, follow your treatment plan to keep it in your target range.  Have your cholesterol checked as often as recommended by your health care provider. If you have high cholesterol, follow your treatment plan to  lower it and keep it in your target range.  Talk with your health care provider about symptoms to watch for. Some people may not have any symptoms, so it can be hard to know that they have atrial fibrillation. Talk with your health care provider if you experience: ? A feeling that your heart is beating rapidly or irregularly. ? An irregular pulse. ? A feeling of discomfort or pain in your chest. ? Shortness of breath. ? Sudden light-headedness or weakness. ? Tiredness (fatigue) that happens easily during exercise.  If you have obstructive sleep apnea (OSA), manage your condition as told by your health care provider. General instructions  Maintain a healthy weight. Do not use diet pills unless your health care provider approves. Diet pills may make heart problems worse.  Exercise regularly. Get at least 30 minutes of activity on most or all days, or as told by your health care provider.  Do not use any products that contain nicotine or tobacco, such as cigarettes and e-cigarettes. If you need help quitting, ask your health care provider.  Do not use drugs, such as cocaine and amphetamines.  Keep all follow-up visits as told by your health care providers. This is important. These include visits with your heart specialist. Where to find more information You may find more information about preventing atrial fibrillation-related stroke from:  National Stroke Association (AFib-Stroke Connection): www.stroke.org Contact a health care provider if:  You notice a change in the rate, rhythm, or strength of your heartbeat.  You have dizziness.  You are taking an anticoagulant and you have more bruises than usual.  You tire out more easily when you exercise or do similar activities. Get help right away if:   You have chest pain.  You have pain in your abdomen.  You experience unusual sweating or weakness.  You take anticoagulants and you: ? Have severe headaches or confusion. ? Have  blood in your vomit, bowel movement, or urine. ? Have bleeding that will not stop. ? Fall or injure your head.  You have any symptoms of a stroke. "BE FAST" is an easy way to remember the main warning signs of a stroke: ? B - Balance. Signs are dizziness, trouble walking, or loss of balance. ? E - Eyes. Signs are trouble seeing or a sudden change in vision. ? F - Face. Signs are sudden weakness or numbness of the face, or the face or eyelid drooping on one side. ? A - Arms. Signs are weakness or numbness in an arm. This happens suddenly and usually on one side of the body. ? S - Speech. Signs are sudden trouble speaking, slurred speech, or trouble understanding what people say. ? T - Time. Time to call emergency services. Write down what time symptoms started.  You have other signs of a stroke, such as: ? A sudden, severe headache with no known cause. ? Nausea or vomiting. ? Seizure. These symptoms may represent a serious problem that is an emergency. Do not wait to see if the symptoms will go away. Get medical help right away. Call your local emergency services (911 in the [place] Do not drive yourself to the hospital. Summary  Having atrial fibrillation increases the risk for a stroke. Talk with your health care provider about what symptoms to watch for.  Atrial fibrillation-related stroke is preventable. Proper management of atrial fibrillation can prevent you from having a stroke.  Talk with your health care provider about whether anticoagulant medicine is right for you.  Learn the warning signs of a stroke and remember "BE FAST." This information is not intended to replace advice given to you by your health care provider. Make sure you discuss any questions you have with your health care provider. Document Revised: 09/22/2018 Document Reviewed: 09/12/2016 Elsevier Patient Education  [place].

## 2019-11-04 NOTE — Chronic Care Management (AMB) (Addendum)
Chronic Care Management   Follow Up Note   11/04/2019 Name: Brittany Kirk MRN: [id number] DOB: 1945/10/16  Referred by: Brittany Frizzle, MD Reason for referral : Chronic Care Management (Follow up PharmD)   Brittany Kirk is a 74 y.o. year old female who is a primary care patient of Brittany Kirk, Brittany Mcgee, MD. The CCM team was consulted for assistance with chronic disease management and care coordination needs.    Review of patient status, including review of consultants reports, relevant laboratory and other test results, and collaboration with appropriate care team members and the patient's provider was performed as part of comprehensive patient evaluation and provision of chronic care management services.    SDOH (Social Determinants of Health) assessments performed: No See Care Plan activities for detailed interventions related to Brittany Kirk)      Outpatient Encounter Medications as of 11/04/2019  Medication Sig   apixaban (ELIQUIS) 5 MG TABS tablet Take 1 tablet (5 mg total) by mouth 2 (two) times daily.   ascorbic acid (VITAMIN C) 500 MG tablet Take 500 mg by mouth daily.   atorvastatin (LIPITOR) 40 MG tablet TAKE 1 TABLET BY MOUTH EVERYDAY AT BEDTIME   cholecalciferol (VITAMIN D3) 25 MCG (1000 UNIT) tablet Take 1,000 Units by mouth daily.   Cyanocobalamin (B-12) 2500 MCG TABS Take 2,500 mcg by mouth daily.   diazepam (VALIUM) 5 MG tablet Take 5 mg by mouth daily as needed for anxiety.    metoprolol succinate (TOPROL-XL) 25 MG 24 hr tablet Take 0.5 tablets (12.5 mg total) by mouth daily. (Patient not taking: Reported on 11/04/2019)   omeprazole (PRILOSEC) 20 MG capsule Take 20 mg by mouth daily.   traZODone (DESYREL) 50 MG tablet TAKE 1/2 TO 1 TABLET BY MOUTH AT BEDTIME AS NEEDED FOR SLEEP   zinc gluconate 50 MG tablet Take 50 mg by mouth daily.   No facility-administered encounter medications on file as of 11/04/2019.     Objective:   Patient call for CCM with PharmD for follow up  of blood pressure and new monitoring plan.  . Hypertension    Office blood pressures are  BP Readings from Last 3 Encounters:  10/20/19 122/60  09/29/19 120/60  07/22/19 (!) 122/58    Patient has failed these meds in the past: none noted  Patient checks BP at home twice daily  Patient home BP readings are ranging: 120/50, 131/58,  89/48, 89/34, 88/35, 97/37, 102/49, 131/56 are some recent BP readings  Pt reports that she did get the new arm cuff recommended at last visit.  Monitors twice daily and records in log.  Reports that first BP of the day is noramlly 120s-130s/50-60s, then BP tends to drop down about one hour later upon recheck.  Reports lots of dizziness and sluggishness when this happens.  No longer on any BP medication due to bradycardia.  Plan  Continue current medications.  Follow- up with cardiologist on low BP and feelings of dizziness if this does not resolve within a few days.     Atrial Fibrilation    Patient has failed these meds in past: none noted Patient is currently controlled on the following medications: Eliquis 5mg  daily  We discussed: no bruising or bleeding reported at this visit  Plan  Continue current medications   Plan:   Telephone follow up appointment with care management team member scheduled for: 05/04/2020 @ 4:30 PM  Beverly Milch, PharmD Clinical Pharmacist [place] [phone]   I have  collaborated with the care management provider regarding care management and care coordination activities outlined in this encounter and have reviewed this encounter including documentation in the note and care plan. I am certifying that I agree with the content of this note and encounter as supervising physician.

## 2019-11-29 ENCOUNTER — Other Ambulatory Visit: Payer: Self-pay | Admitting: Cardiovascular Disease

## 2019-11-30 ENCOUNTER — Telehealth: Payer: Self-pay | Admitting: Cardiovascular Disease

## 2019-11-30 ENCOUNTER — Other Ambulatory Visit: Payer: Self-pay | Admitting: Oncology

## 2019-11-30 DIAGNOSIS — Z853 Personal history of malignant neoplasm of breast: Secondary | ICD-10-CM

## 2019-11-30 DIAGNOSIS — Z9889 Other specified postprocedural states: Secondary | ICD-10-CM

## 2019-11-30 NOTE — Telephone Encounter (Signed)
Spoke with pt, she forgot to tell dr berry that dr pickard stopped her metoprolol due to low diastolic bp. She continues to c/o fatigue and feeling very tired. She is not on any more bp medications. She will increase her fluids and salt to help rise her bp . We discussed exercising her feet before getting up to help raise her bp some. shen will try this and call back if no better. Will forward to dr berry to review and advise.

## 2019-11-30 NOTE — Telephone Encounter (Signed)
Pt c/o BP issue: STAT if pt c/o blurred vision, one-sided weakness or slurred speech  1. What are your last 5 BP readings?  06/21: 143/58, 104/37, 97/38 06/11: 124/54, 91/39 06/12: 134/59, 105/41 98/36, 107/51, 95/39, 96/38, 105/38  2. Are you having any other symptoms (ex. Dizziness, headache, blurred vision, passed out)? Fatigue, constant thirst, Feels cold, cvNumbness and tingling in legs, Dizziness upon standing occasionally   3. What is your BP issue? Dr. Dennard Schaumann (pt's PCP) took her off of her BP medication because her BP was so low. The pt feels her BP is still low

## 2019-12-16 ENCOUNTER — Ambulatory Visit
Admission: RE | Admit: 2019-12-16 | Discharge: 2019-12-16 | Disposition: A | Discharge status: Home / Self Care | Payer: Medicare Other | Source: Ambulatory Visit | Attending: Oncology | Admitting: Oncology

## 2019-12-16 ENCOUNTER — Other Ambulatory Visit: Payer: Self-pay

## 2019-12-16 DIAGNOSIS — Z853 Personal history of malignant neoplasm of breast: Secondary | ICD-10-CM

## 2019-12-16 DIAGNOSIS — Z9889 Other specified postprocedural states: Secondary | ICD-10-CM

## 2020-01-01 ENCOUNTER — Telehealth: Payer: Self-pay

## 2020-01-01 NOTE — Telephone Encounter (Signed)
Pt called stating her Afib woke her up at midnight on 07/23 and her bp is 133/67, pulse is ranging in the 60's and 70's which is abnormal for her. I advised pt to call her cardiologist for further assistance on what to do due to Dr. Dennard Schaumann has no open appts available.

## 2020-01-05 ENCOUNTER — Ambulatory Visit (INDEPENDENT_AMBULATORY_CARE_PROVIDER_SITE_OTHER): Payer: Medicare Other | Admitting: Family Medicine

## 2020-01-05 ENCOUNTER — Other Ambulatory Visit: Payer: Self-pay

## 2020-01-05 VITALS — BP 130/60 | HR 60 | Temp 97.6°F | Ht 59.0 in | Wt 119.0 lb

## 2020-01-05 DIAGNOSIS — N3001 Acute cystitis with hematuria: Secondary | ICD-10-CM

## 2020-01-05 DIAGNOSIS — I2510 Atherosclerotic heart disease of native coronary artery without angina pectoris: Secondary | ICD-10-CM

## 2020-01-05 DIAGNOSIS — R300 Dysuria: Secondary | ICD-10-CM | POA: Diagnosis not present

## 2020-01-05 DIAGNOSIS — R8290 Unspecified abnormal findings in urine: Secondary | ICD-10-CM

## 2020-01-05 LAB — URINALYSIS, ROUTINE W REFLEX MICROSCOPIC
Bacteria, UA: NONE SEEN /HPF
Bilirubin Urine: NEGATIVE
Glucose, UA: NEGATIVE
Hyaline Cast: NONE SEEN /LPF
Ketones, ur: NEGATIVE
Leukocytes,Ua: NEGATIVE
Nitrite: NEGATIVE
Protein, ur: NEGATIVE
Specific Gravity, Urine: 1.015 (ref 1.001–1.03)
WBC, UA: NONE SEEN /HPF (ref 0–5)
pH: 5 (ref 5.0–8.0)

## 2020-01-05 LAB — MICROSCOPIC MESSAGE

## 2020-01-05 MED ORDER — CIPROFLOXACIN HCL 250 MG PO TABS
250.0000 mg | ORAL_TABLET | Freq: Two times a day (BID) | ORAL | 0 refills | Status: DC
Start: 2020-01-05 — End: 2020-06-01

## 2020-01-05 NOTE — Addendum Note (Signed)
Addended by: Saundra Shelling on: 01/05/2020 03:30 PM   Modules accepted: Orders

## 2020-01-05 NOTE — Progress Notes (Signed)
Subjective:    Patient ID: Brittany Kirk, female    DOB: 05-16-1946, 74 y.o.   MRN: [id number]  HPI  Patient reports dysuria for 5 days.  She reports increased urinary frequency and urgency.  She also reports some vague lower back pain.  Recently she started noticing drops of blood in her urine as well.  She denies any fevers or chills.  She denies any nausea or vomiting.  She states that it feels like there is fire in her urethra.  This is a constant sensation that does not change whether she is voiding or not.  Urinalysis today shows +2 blood but is otherwise negative.   On eliquis for a fib.  Past Medical History:  Diagnosis Date  . Allergic rhinitis   . Anxiety   . Asymptomatic bilateral carotid artery stenosis    40-59% (02/2018)  . Atrial fibrillation ([place]) 07/2019  . Breast cancer ([place]) 2015   Right Breast Cancer  . CAD (coronary artery disease)   . Depression   . Emphysema    no longer uses inhalers  . Gallstones    nausea, pain upper right abdomen  . GERD (gastroesophageal reflux disease)   . H/O hiatal hernia   . Hiatal hernia   . History of skin cancer   . Hyperlipidemia   . Hypertension   . Multinodular goiter (nontoxic)   . Scoliosis   . Tobacco user   . Wears dentures    top   Past Surgical History:  Procedure Laterality Date  . BREAST LUMPECTOMY Right 2015  . CHOLECYSTECTOMY  07/08/2012   Procedure: LAPAROSCOPIC CHOLECYSTECTOMY WITH INTRAOPERATIVE CHOLANGIOGRAM;  Surgeon: Gayland Curry, MD,FACS;  Location: [facility];  Service: General;  Laterality: N/A;  Laparoscopic Cholecystectomy with Intraoperative Cholangiogram  . COLONOSCOPY    . ESOPHAGOSCOPY N/A 05/29/2017   Procedure: ESOPHAGOSCOPY;  Surgeon: Clarene Essex, MD;  Location: [facility];  Service: Endoscopy;  Laterality: N/A;  botox injection  . HAND SURGERY Left    wrist  . LEFT HEART CATH AND CORONARY ANGIOGRAPHY N/A 10/11/2017   Procedure: LEFT HEART CATH AND CORONARY ANGIOGRAPHY;  Surgeon: Martinique,  Peter M, MD;  Location: [facility];  Service: Cardiovascular;  Laterality: N/A;  . TONSILECTOMY, ADENOIDECTOMY, BILATERAL MYRINGOTOMY AND TUBES    . TUBAL LIGATION     Current Outpatient Medications on File Prior to Visit  Medication Sig Dispense Refill  . ascorbic acid (VITAMIN C) 500 MG tablet Take 500 mg by mouth daily.    Marland Kitchen atorvastatin (LIPITOR) 40 MG tablet TAKE 1 TABLET BY MOUTH EVERYDAY AT BEDTIME 90 tablet 2  . cholecalciferol (VITAMIN D3) 25 MCG (1000 UNIT) tablet Take 1,000 Units by mouth daily.    . Cyanocobalamin (B-12) 2500 MCG TABS Take 2,500 mcg by mouth daily.    . diazepam (VALIUM) 5 MG tablet Take 5 mg by mouth daily as needed for anxiety.   3  . ELIQUIS 5 MG TABS tablet TAKE 1 TABLET BY MOUTH TWICE A DAY 60 tablet 3  . metoprolol succinate (TOPROL-XL) 25 MG 24 hr tablet Take 0.5 tablets (12.5 mg total) by mouth daily. (Patient not taking: Reported on 11/04/2019) 90 tablet 3  . omeprazole (PRILOSEC) 20 MG capsule Take 20 mg by mouth daily.    . traZODone (DESYREL) 50 MG tablet TAKE 1/2 TO 1 TABLET BY MOUTH AT BEDTIME AS NEEDED FOR SLEEP 90 tablet 3  . zinc gluconate 50 MG tablet Take 50 mg by mouth daily.  No current facility-administered medications on file prior to visit.   Allergies  Allergen Reactions  . Penicillins Anaphylaxis, Itching, Swelling and Rash    Has patient had a PCN reaction causing immediate rash, facial/tongue/throat swelling, SOB or lightheadedness with hypotension: Yes Has patient had a PCN reaction causing severe rash involving mucus membranes or skin necrosis: No Has patient had a PCN reaction that required hospitalization: Yes Has patient had a PCN reaction occurring within the last 10 years: No If all of the above answers are "NO", then may proceed with Cephalosporin use.   . Cefaclor Itching, Swelling and Rash  . Latex Itching and Rash  . Metronidazole Itching, Swelling and Rash  . Naproxen Itching, Swelling and Rash  . Nsaids  Itching, Swelling and Rash    Ibuprofen IS tolerated  . Septra [Bactrim] Itching, Swelling and Rash  . Sulfonamide Derivatives Itching, Swelling and Rash  . Tape Itching and Rash   Social History   Socioeconomic History  . Marital status: Married    Spouse name: Not on file  . Number of children: 1  . Years of education: Not on file  . Highest education level: Not on file  Occupational History  . Occupation: retired Clinical cytogeneticist: RETIRED  Tobacco Use  . Smoking status: Former Smoker    Packs/day: 0.50    Years: 40.00    Pack years: 20.00    Types: Cigarettes    Quit date: 02/13/2011    Years since quitting: 8.8  . Smokeless tobacco: Never Used  Vaping Use  . Vaping Use: Never used  Substance and Sexual Activity  . Alcohol use: No  . Drug use: No  . Sexual activity: Not on file  Other Topics Concern  . Not on file  Social History Narrative  . Not on file   Social Determinants of Health   Financial Resource Strain: Low Risk   . Difficulty of Paying Living Expenses: Not hard at all  Food Insecurity:   . Worried About Charity fundraiser in the Last Year:   . Arboriculturist in the Last Year:   Transportation Needs:   . Film/video editor (Medical):   Marland Kitchen Lack of Transportation (Non-Medical):   Physical Activity:   . Days of Exercise per Week:   . Minutes of Exercise per Session:   Stress:   . Feeling of Stress :   Social Connections:   . Frequency of Communication with Friends and Family:   . Frequency of Social Gatherings with Friends and Family:   . Attends Religious Services:   . Active Member of Clubs or Organizations:   . Attends Archivist Meetings:   Marland Kitchen Marital Status:   Intimate Partner Violence:   . Fear of Current or Ex-Partner:   . Emotionally Abused:   Marland Kitchen Physically Abused:   . Sexually Abused:      Review of Systems  All other systems reviewed and are negative.      Objective:   Physical Exam Vitals  reviewed.  Constitutional:      General: She is not in acute distress.    Appearance: She is well-developed. She is not diaphoretic.  HENT:     Head: Normocephalic and atraumatic.     Right Ear: External ear normal.     Left Ear: External ear normal.     Mouth/Throat:     Pharynx: No oropharyngeal exudate.  Eyes:     Conjunctiva/sclera: Conjunctivae  normal.     Pupils: Pupils are equal, round, and reactive to light.  Neck:     Thyroid: No thyromegaly.     Vascular: No JVD.  Cardiovascular:     Rate and Rhythm: Normal rate and regular rhythm.     Heart sounds: Normal heart sounds. No murmur heard.  No friction rub. No gallop.   Pulmonary:     Effort: Pulmonary effort is normal. No respiratory distress.     Breath sounds: Normal breath sounds. No stridor. No wheezing or rales.  Abdominal:     General: Bowel sounds are normal. There is no distension.     Palpations: Abdomen is soft.     Tenderness: There is no abdominal tenderness. There is no guarding.  Musculoskeletal:     Cervical back: Normal range of motion.  Lymphadenopathy:     Cervical: No cervical adenopathy.  Neurological:     Mental Status: She is alert and oriented to person, place, and time.     Cranial Nerves: No cranial nerve deficit.     Sensory: No sensory deficit.     Motor: Tremor present. No atrophy, abnormal muscle tone or seizure activity.     Deep Tendon Reflexes: Reflexes are normal and symmetric.           Assessment & Plan:  Dysuria  Hematuria due to acute cystitis  I suspect that her hematuria is likely due to cystitis given the discomfort that she is having in her bladder.  I have recommended that she take Cipro 250 mg twice daily for 5 days given her allergy to cephalosporins penicillins and sulfur.  Return to repeat a urinalysis in 1 week to ensure resolution of the hematuria.  I will send a urine culture

## 2020-01-06 LAB — URINE CULTURE
MICRO NUMBER:: 10754666
SPECIMEN QUALITY:: ADEQUATE

## 2020-01-07 ENCOUNTER — Telehealth: Payer: Self-pay

## 2020-01-07 ENCOUNTER — Other Ambulatory Visit: Payer: Self-pay

## 2020-01-07 DIAGNOSIS — R8290 Unspecified abnormal findings in urine: Secondary | ICD-10-CM

## 2020-01-07 NOTE — Telephone Encounter (Signed)
Ok with urology consult but I would start with ct scan renal stone protocol for dx: hematuria

## 2020-01-07 NOTE — Telephone Encounter (Signed)
Pt called to report that she is still having blood in her urine and feeling pressure and pain in her pelvic area. Pt also reports back pain. Pt states that she has stopped taking the antibiotics. Pt would like a referral to urology. Please advise.

## 2020-01-08 ENCOUNTER — Telehealth: Payer: Self-pay

## 2020-01-08 ENCOUNTER — Other Ambulatory Visit: Payer: Self-pay

## 2020-01-08 DIAGNOSIS — N3001 Acute cystitis with hematuria: Secondary | ICD-10-CM

## 2020-01-08 NOTE — Telephone Encounter (Signed)
Error

## 2020-01-08 NOTE — Progress Notes (Unsigned)
urol

## 2020-01-11 ENCOUNTER — Other Ambulatory Visit: Payer: Medicare Other

## 2020-01-11 ENCOUNTER — Ambulatory Visit
Admission: RE | Admit: 2020-01-11 | Discharge: 2020-01-11 | Disposition: A | Discharge status: Home / Self Care | Payer: Medicare Other | Source: Ambulatory Visit | Attending: Family Medicine | Admitting: Family Medicine

## 2020-01-11 ENCOUNTER — Other Ambulatory Visit: Payer: Self-pay

## 2020-01-11 DIAGNOSIS — R8290 Unspecified abnormal findings in urine: Secondary | ICD-10-CM

## 2020-01-11 DIAGNOSIS — N3001 Acute cystitis with hematuria: Secondary | ICD-10-CM | POA: Diagnosis not present

## 2020-01-11 DIAGNOSIS — I700 Atherosclerosis of aorta: Secondary | ICD-10-CM | POA: Diagnosis not present

## 2020-01-11 DIAGNOSIS — Z9049 Acquired absence of other specified parts of digestive tract: Secondary | ICD-10-CM | POA: Diagnosis not present

## 2020-01-11 LAB — URINALYSIS, ROUTINE W REFLEX MICROSCOPIC
Bilirubin Urine: NEGATIVE
Glucose, UA: NEGATIVE
Leukocytes,Ua: NEGATIVE
Nitrite: NEGATIVE
Protein, ur: NEGATIVE
Specific Gravity, Urine: 1.025 (ref 1.001–1.03)
pH: 5 (ref 5.0–8.0)

## 2020-01-11 LAB — MICROSCOPIC MESSAGE

## 2020-01-12 ENCOUNTER — Other Ambulatory Visit: Payer: Self-pay

## 2020-01-29 ENCOUNTER — Telehealth: Payer: Self-pay | Admitting: Cardiovascular Disease

## 2020-01-29 NOTE — Telephone Encounter (Signed)
Spoke with pt and has noted very low B/P and HR readings This has been going on for quite some time since June Per pt today has been ranging anywhere from 88-131/32-53 and HR in the 50's also is dizzy and fatigue Per pt B/P increased to 98/34 after eating some chips Also drinks at least 4 bottles of water a day in addition to other liquids Encouraged pt to try and drink Gatorade as well Per pt will try Pedialyte Appt made with Coletta Memos NP for August 30 at 9:15 am Will forward to Dr Gwenlyn Found for review and recommendations .Adonis Housekeeper

## 2020-01-29 NOTE — Telephone Encounter (Signed)
    Pt c/o BP issue: STAT if pt c/o blurred vision, one-sided weakness or slurred speech  1. What are your last 5 BP readings?  121/53 88/32 98/32  96/36 98/41  2. Are you having any other symptoms (ex. Dizziness, headache, blurred vision, passed out)? Dizziness, fatigue, sleepy and feel cold.  3. What is your BP issue? Pt said she's been having this issue since 06/21 when she called last time. She said she is not getting better, she also said she's been having depression lately.

## 2020-01-29 NOTE — Telephone Encounter (Signed)
She has symptomatic hypotension and is only on very low dose BB. Please have her come in to see a Pharm D to consider startig Midodrine

## 2020-02-01 NOTE — Telephone Encounter (Signed)
Pt already scheduled with APP on 8/30, can discuss addition of midodrine at that time. Unfortunately, no PharmD availability sooner than that date. If meds are started at next APP appt, pt can follow up with PharmD after if she would like.

## 2020-02-08 ENCOUNTER — Ambulatory Visit: Payer: Medicare Other | Admitting: General Practice

## 2020-02-26 ENCOUNTER — Ambulatory Visit (HOSPITAL_COMMUNITY)
Admission: RE | Admit: 2020-02-26 | Discharge: 2020-02-26 | Disposition: A | Discharge status: Home / Self Care | Payer: Medicare Other | Source: Ambulatory Visit | Attending: Cardiovascular Disease | Admitting: Cardiovascular Disease

## 2020-02-26 ENCOUNTER — Other Ambulatory Visit: Payer: Self-pay

## 2020-02-26 ENCOUNTER — Other Ambulatory Visit (HOSPITAL_COMMUNITY): Payer: Self-pay | Admitting: Cardiovascular Disease

## 2020-02-26 DIAGNOSIS — I6523 Occlusion and stenosis of bilateral carotid arteries: Secondary | ICD-10-CM

## 2020-03-01 ENCOUNTER — Other Ambulatory Visit: Payer: Self-pay

## 2020-03-01 ENCOUNTER — Ambulatory Visit (INDEPENDENT_AMBULATORY_CARE_PROVIDER_SITE_OTHER): Payer: Medicare Other | Admitting: Urology

## 2020-03-01 ENCOUNTER — Encounter: Payer: Self-pay | Admitting: Urology

## 2020-03-01 VITALS — BP 147/62 | HR 64 | Temp 97.6°F | Ht 59.0 in | Wt 119.0 lb

## 2020-03-01 DIAGNOSIS — I2510 Atherosclerotic heart disease of native coronary artery without angina pectoris: Secondary | ICD-10-CM

## 2020-03-01 DIAGNOSIS — R319 Hematuria, unspecified: Secondary | ICD-10-CM

## 2020-03-01 LAB — URINALYSIS, ROUTINE W REFLEX MICROSCOPIC
Bilirubin, UA: NEGATIVE
Glucose, UA: NEGATIVE
Leukocytes,UA: NEGATIVE
Nitrite, UA: NEGATIVE
Protein,UA: NEGATIVE
Specific Gravity, UA: 1.02 (ref 1.005–1.030)
Urobilinogen, Ur: 0.2 mg/dL (ref 0.2–1.0)
pH, UA: 5 (ref 5.0–7.5)

## 2020-03-01 LAB — MICROSCOPIC EXAMINATION
Epithelial Cells (non renal): >10 /hpf — AB (ref 0–10)
Renal Epithel, UA: NONE SEEN /hpf

## 2020-03-01 NOTE — Progress Notes (Signed)
H&P  Chief Complaint: Hematuria  History of Present Illness: Brittany Kirk is a 74 y.o. year old female here as a new patient. Pt experiencing gross hematuria starting in mid-July. Pt notes urinary/abdominal pressure associated with her hematuria. She reports 3 episodes of bright blood followed by occasional recurrent tint to her urine. ~ 40 pack year h/o smoking, quit 9 years ago.   Past Medical History:  Diagnosis Date  . Allergic rhinitis   . Anxiety   . Asymptomatic bilateral carotid artery stenosis    40-59% (02/2018)  . Atrial fibrillation ([place]) 07/2019  . Breast cancer ([place]) 2015   Right Breast Cancer  . CAD (coronary artery disease)   . Depression   . Emphysema    no longer uses inhalers  . Gallstones    nausea, pain upper right abdomen  . GERD (gastroesophageal reflux disease)   . H/O hiatal hernia   . Hiatal hernia   . History of skin cancer   . Hyperlipidemia   . Hypertension   . Multinodular goiter (nontoxic)   . Scoliosis   . Tobacco user   . Wears dentures    top    Past Surgical History:  Procedure Laterality Date  . BREAST LUMPECTOMY Right 2015  . CHOLECYSTECTOMY  07/08/2012   Procedure: LAPAROSCOPIC CHOLECYSTECTOMY WITH INTRAOPERATIVE CHOLANGIOGRAM;  Surgeon: Gayland Curry, MD,FACS;  Location: [facility];  Service: General;  Laterality: N/A;  Laparoscopic Cholecystectomy with Intraoperative Cholangiogram  . COLONOSCOPY    . ESOPHAGOSCOPY N/A 05/29/2017   Procedure: ESOPHAGOSCOPY;  Surgeon: Clarene Essex, MD;  Location: [facility];  Service: Endoscopy;  Laterality: N/A;  botox injection  . HAND SURGERY Left    wrist  . LEFT HEART CATH AND CORONARY ANGIOGRAPHY N/A 10/11/2017   Procedure: LEFT HEART CATH AND CORONARY ANGIOGRAPHY;  Surgeon: Martinique, Peter M, MD;  Location: [facility];  Service: Cardiovascular;  Laterality: N/A;  . TONSILECTOMY, ADENOIDECTOMY, BILATERAL MYRINGOTOMY AND TUBES    . TUBAL LIGATION      Home Medications:  (Not in a  hospital admission)   Allergies:  Allergies  Allergen Reactions  . Penicillins Anaphylaxis, Itching, Swelling and Rash    Has patient had a PCN reaction causing immediate rash, facial/tongue/throat swelling, SOB or lightheadedness with hypotension: Yes Has patient had a PCN reaction causing severe rash involving mucus membranes or skin necrosis: No Has patient had a PCN reaction that required hospitalization: Yes Has patient had a PCN reaction occurring within the last 10 years: No If all of the above answers are "NO", then may proceed with Cephalosporin use.   . Cefaclor Itching, Swelling and Rash  . Latex Itching and Rash  . Metronidazole Itching, Swelling and Rash  . Naproxen Itching, Swelling and Rash  . Nsaids Itching, Swelling and Rash    Ibuprofen IS tolerated  . Septra [Bactrim] Itching, Swelling and Rash  . Sulfonamide Derivatives Itching, Swelling and Rash  . Tape Itching and Rash    Family History  Problem Relation Age of Onset  . Arthritis Mother   . Heart disease Other        5 brothers and 2 sister  . Diverticulitis Sister   . Aplastic anemia Other     Social History:  reports that she quit smoking about 9 years ago. Her smoking use included cigarettes. She has a 20.00 pack-year smoking history. She has never used smokeless tobacco. She reports that she does not drink alcohol and does not use drugs.  ROS:  A complete review of systems was performed.  All systems are negative except for pertinent findings as noted.  Physical Exam:  Vital signs in last 24 hours: BP: ()/()  Arterial Line BP: ()/()  General:  Alert and oriented, No acute distress HEENT: Normocephalic, atraumatic Neck: No JVD or lymphadenopathy Cardiovascular: Regular rate  Lungs: Normal inspiratory/expiratory excursion Abdomen: Soft, nontender, nondistended, no abdominal masses LQ tenderness (L) Back: No CVA tenderness Extremities: No edema Neurologic: Grossly intact  I have reviewed prior  pt notes  I have reviewed notes from referring/previous physicians  I have reviewed urinalysis results  I have independently reviewed prior imaging (CT images reviewed)  I have reviewed prior urine culture Impression/Assessment:  H/O gross hematuria in ex-smoker. Normal non contrasted CT  Plan:  1. Urine sent for cytology  2. F/U w/ cystoscopy at next available appointment.  Budd Palmer 03/01/2020, 9:09 AM  [name]. Giannie Soliday MD

## 2020-03-01 NOTE — Progress Notes (Signed)
Urological Symptom Review  Patient is experiencing the following symptoms: Hard to postpone urination Blood in urine   Review of Systems  Gastrointestinal (upper)  : Indigestion/heartburn  Gastrointestinal (lower) : Negative for lower GI symptoms  Constitutional : Weight loss Fatigue  Skin: Negative for skin symptoms  Eyes: Negative for eye symptoms  Ear/Nose/Throat : Negative for Ear/Nose/Throat symptoms  Hematologic/Lymphatic: Easy bruising  Cardiovascular : Negative for cardiovascular symptoms  Respiratory : Negative for respiratory symptoms  Endocrine: Negative for endocrine symptoms  Musculoskeletal: Back pain  Neurological: Negative for neurological symptoms  Psychologic: Anxiety

## 2020-03-12 ENCOUNTER — Emergency Department (HOSPITAL_COMMUNITY)
Admission: EM | Admit: 2020-03-12 | Discharge: 2020-03-12 | Disposition: A | Discharge status: Home / Self Care | Payer: Medicare Other | Attending: Emergency Medicine | Admitting: Emergency Medicine

## 2020-03-12 ENCOUNTER — Emergency Department (HOSPITAL_COMMUNITY): Payer: Medicare Other

## 2020-03-12 ENCOUNTER — Encounter (HOSPITAL_COMMUNITY): Payer: Self-pay | Admitting: Emergency Medicine

## 2020-03-12 DIAGNOSIS — I129 Hypertensive chronic kidney disease with stage 1 through stage 4 chronic kidney disease, or unspecified chronic kidney disease: Secondary | ICD-10-CM | POA: Insufficient documentation

## 2020-03-12 DIAGNOSIS — N182 Chronic kidney disease, stage 2 (mild): Secondary | ICD-10-CM | POA: Insufficient documentation

## 2020-03-12 DIAGNOSIS — J449 Chronic obstructive pulmonary disease, unspecified: Secondary | ICD-10-CM | POA: Diagnosis not present

## 2020-03-12 DIAGNOSIS — Z853 Personal history of malignant neoplasm of breast: Secondary | ICD-10-CM | POA: Insufficient documentation

## 2020-03-12 DIAGNOSIS — I493 Ventricular premature depolarization: Secondary | ICD-10-CM | POA: Diagnosis not present

## 2020-03-12 DIAGNOSIS — Z79899 Other long term (current) drug therapy: Secondary | ICD-10-CM | POA: Insufficient documentation

## 2020-03-12 DIAGNOSIS — Z87891 Personal history of nicotine dependence: Secondary | ICD-10-CM | POA: Diagnosis not present

## 2020-03-12 DIAGNOSIS — Z85828 Personal history of other malignant neoplasm of skin: Secondary | ICD-10-CM | POA: Diagnosis not present

## 2020-03-12 DIAGNOSIS — Z91040 Latex allergy status: Secondary | ICD-10-CM | POA: Diagnosis not present

## 2020-03-12 DIAGNOSIS — R079 Chest pain, unspecified: Secondary | ICD-10-CM | POA: Insufficient documentation

## 2020-03-12 DIAGNOSIS — R0789 Other chest pain: Secondary | ICD-10-CM | POA: Diagnosis not present

## 2020-03-12 DIAGNOSIS — R002 Palpitations: Secondary | ICD-10-CM | POA: Insufficient documentation

## 2020-03-12 DIAGNOSIS — Z7901 Long term (current) use of anticoagulants: Secondary | ICD-10-CM | POA: Insufficient documentation

## 2020-03-12 DIAGNOSIS — I491 Atrial premature depolarization: Secondary | ICD-10-CM | POA: Diagnosis not present

## 2020-03-12 DIAGNOSIS — R030 Elevated blood-pressure reading, without diagnosis of hypertension: Secondary | ICD-10-CM

## 2020-03-12 DIAGNOSIS — I959 Hypotension, unspecified: Secondary | ICD-10-CM | POA: Diagnosis not present

## 2020-03-12 DIAGNOSIS — Z8679 Personal history of other diseases of the circulatory system: Secondary | ICD-10-CM

## 2020-03-12 DIAGNOSIS — I10 Essential (primary) hypertension: Secondary | ICD-10-CM | POA: Diagnosis not present

## 2020-03-12 DIAGNOSIS — I2510 Atherosclerotic heart disease of native coronary artery without angina pectoris: Secondary | ICD-10-CM | POA: Insufficient documentation

## 2020-03-12 DIAGNOSIS — I4891 Unspecified atrial fibrillation: Secondary | ICD-10-CM | POA: Diagnosis not present

## 2020-03-12 LAB — CBC
HCT: 38 % (ref 36.0–46.0)
Hemoglobin: 12.5 g/dL (ref 12.0–15.0)
MCH: 32.9 pg (ref 26.0–34.0)
MCHC: 32.9 g/dL (ref 30.0–36.0)
MCV: 100 fL (ref 80.0–100.0)
Platelets: 193 10*3/uL (ref 150–400)
RBC: 3.8 MIL/uL — ABNORMAL LOW (ref 3.87–5.11)
RDW: 13.3 % (ref 11.5–15.5)
WBC: 5.9 10*3/uL (ref 4.0–10.5)
nRBC: 0 % (ref 0.0–0.2)

## 2020-03-12 LAB — BASIC METABOLIC PANEL WITH GFR
Anion gap: 9 (ref 5–15)
BUN: 11 mg/dL (ref 8–23)
CO2: 24 mmol/L (ref 22–32)
Calcium: 8.5 mg/dL — ABNORMAL LOW (ref 8.9–10.3)
Chloride: 106 mmol/L (ref 98–111)
Creatinine, Ser: 0.86 mg/dL (ref 0.44–1.00)
GFR calc Af Amer: >60 mL/min (ref >60)
GFR calc non Af Amer: >60 mL/min (ref >60)
Glucose, Bld: 147 mg/dL — ABNORMAL HIGH (ref 70–99)
Potassium: 4 mmol/L (ref 3.5–5.1)
Sodium: 139 mmol/L (ref 135–145)

## 2020-03-12 LAB — TROPONIN I (HIGH SENSITIVITY)
Troponin I (High Sensitivity): 7 ng/L (ref <18)
Troponin I (High Sensitivity): 5 ng/L (ref <18)

## 2020-03-12 MED ORDER — POTASSIUM CHLORIDE CRYS ER 20 MEQ PO TBCR
40.0000 meq | EXTENDED_RELEASE_TABLET | Freq: Once | ORAL | Status: AC
  Administered 2020-03-12: 40 meq via ORAL
  Filled 2020-03-12: qty 2

## 2020-03-12 NOTE — ED Notes (Signed)
Dr. Ashok Cordia aware of BP 200/66

## 2020-03-12 NOTE — ED Triage Notes (Signed)
EMS stated, pt states ive been in and out of A-Fib. Had some chest pain and palpitations and nausea for a week.; IV 20 g in left AC. Zofran 4mg . IV

## 2020-03-12 NOTE — ED Notes (Signed)
Gave pt drink and cheese and crackers.

## 2020-03-12 NOTE — ED Provider Notes (Addendum)
[facility] EMERGENCY DEPARTMENT Provider Note   CSN: [id number] Arrival date & time: 03/12/20  1016     History Chief Complaint  Patient presents with  . Palpitations    Brittany Kirk is a 74 y.o. female.  Patient with hx afib, presents via EMS indicating she feels as though she may have been in afib the past 4-5 days. Symptoms acute onset, moderate, persistent, feeling as if heart beating irregularly and/or fast. States symptoms occur at rest. No relation to activity or exertion. No syncope. Also states occasional chest pain at rest, in past couple days. Lasts seconds to minutes. No associated nv. States at one point in past couple days did have episode of feeling sweaty, which is unusual for her. States compliant w home meds, including eliquis. Denies recent blood loss, rectal bleeding or melena. No fever or chills.   The history is provided by the patient and the EMS personnel.       Past Medical History:  Diagnosis Date  . Acid reflux   . Allergic rhinitis   . Anxiety   . Asymptomatic bilateral carotid artery stenosis    40-59% (02/2018)  . Atrial fibrillation ([place]) 07/2019  . Breast cancer ([place]) 2015   Right Breast Cancer  . CAD (coronary artery disease)   . Depression   . Emphysema    no longer uses inhalers  . Gallstones    nausea, pain upper right abdomen  . GERD (gastroesophageal reflux disease)   . H/O hiatal hernia   . Hiatal hernia   . History of skin cancer   . Hyperlipidemia   . Hypertension   . Multinodular goiter (nontoxic)   . Scoliosis   . Skin cancer   . Tobacco user   . Wears dentures    top    Patient Active Problem List   Diagnosis Date Noted  . Atrial fibrillation with RVR ([place]) 07/15/2019  . Multinodular goiter (nontoxic)   . Asymptomatic bilateral carotid artery stenosis   . History of depression 02/14/2018  . History of breast cancer 02/14/2018  . Accelerated hypertension 02/14/2018  . Unstable angina ([place])  10/11/2017  . Caloric malnutrition ([place]) 08/16/2017  . Weight loss, unintentional 03/12/2017  . GERD (gastroesophageal reflux disease) 08/10/2016  . Cough 08/10/2016  . Acute bronchitis 07/12/2016  . Bilateral carotid artery disease ([place]) 02/14/2016  . Chest pain 01/13/2016  . Osteoporosis 02/22/2015  . Stage 2 chronic kidney disease due to benign hypertension 08/16/2014  . Malignant neoplasm of upper-outer quadrant of right breast in female, estrogen receptor positive ([place]) 03/22/2014  . Dizziness and giddiness 11/27/2012  . Essential tremor 11/27/2012  . Numbness 11/27/2012  . TOBACCO USER 10/04/2009  . CLOSED FRACTURE OF ONE RIB 07/12/2009  . Dyslipidemia, goal LDL below 70 09/28/2008  . CAD in native artery 09/28/2008  . Rhinitis 09/09/2008  . COPD (chronic obstructive pulmonary disease) ([place]) 09/07/2008  . Essential hypertension 09/06/2008  . SKIN CANCER, HX OF 09/06/2008    Past Surgical History:  Procedure Laterality Date  . BREAST LUMPECTOMY Right 2015  . CHOLECYSTECTOMY  07/08/2012   Procedure: LAPAROSCOPIC CHOLECYSTECTOMY WITH INTRAOPERATIVE CHOLANGIOGRAM;  Surgeon: Gayland Curry, MD,FACS;  Location: [facility];  Service: General;  Laterality: N/A;  Laparoscopic Cholecystectomy with Intraoperative Cholangiogram  . COLONOSCOPY    . ESOPHAGOSCOPY N/A 05/29/2017   Procedure: ESOPHAGOSCOPY;  Surgeon: Clarene Essex, MD;  Location: [facility];  Service: Endoscopy;  Laterality: N/A;  botox injection  . HAND SURGERY Left  wrist  . LEFT HEART CATH AND CORONARY ANGIOGRAPHY N/A 10/11/2017   Procedure: LEFT HEART CATH AND CORONARY ANGIOGRAPHY;  Surgeon: Martinique, Peter M, MD;  Location: [facility];  Service: Cardiovascular;  Laterality: N/A;  . TONSILECTOMY, ADENOIDECTOMY, BILATERAL MYRINGOTOMY AND TUBES    . TUBAL LIGATION       OB History   No obstetric history on file.     Family History  Problem Relation Age of Onset  . Arthritis Mother   . Pancreatic cancer Mother    . Heart disease Other        5 brothers and 2 sister  . Diverticulitis Sister   . Heart disease Sister   . Aplastic anemia Other     Social History   Tobacco Use  . Smoking status: Former Smoker    Packs/day: 0.50    Years: 46.00    Pack years: 23.00    Types: Cigarettes    Quit date: 02/13/2011    Years since quitting: 9.0  . Smokeless tobacco: Never Used  Vaping Use  . Vaping Use: Never used  Substance Use Topics  . Alcohol use: No  . Drug use: No    Home Medications Prior to Admission medications   Medication Sig Start Date End Date Taking? Authorizing Provider  ascorbic acid (VITAMIN C) 500 MG tablet Take 500 mg by mouth daily.    [provider]  atorvastatin (LIPITOR) 40 MG tablet TAKE 1 TABLET BY MOUTH EVERYDAY AT BEDTIME 09/29/19   Susy Frizzle, MD  cholecalciferol (VITAMIN D3) 25 MCG (1000 UNIT) tablet Take 1,000 Units by mouth daily.    [provider]  ciprofloxacin (CIPRO) 250 MG tablet Take 1 tablet (250 mg total) by mouth 2 (two) times daily. Patient not taking: Reported on 03/01/2020 01/05/20   Susy Frizzle, MD  Cyanocobalamin (B-12) 2500 MCG TABS Take 2,500 mcg by mouth daily.    [provider]  diazepam (VALIUM) 5 MG tablet Take 5 mg by mouth daily as needed for anxiety.  07/19/15   [provider]  ELIQUIS 5 MG TABS tablet TAKE 1 TABLET BY MOUTH TWICE A DAY 11/30/19   Lorretta Harp, MD  omeprazole (PRILOSEC) 20 MG capsule Take 20 mg by mouth daily. 07/06/19   [provider]  traZODone (DESYREL) 50 MG tablet TAKE 1/2 TO 1 TABLET BY MOUTH AT BEDTIME AS NEEDED FOR SLEEP 09/09/19   Susy Frizzle, MD  zinc gluconate 50 MG tablet Take 50 mg by mouth daily.    [provider]    Allergies    Penicillins, Cefaclor, Latex, Metronidazole, Naproxen, Nsaids, Septra [bactrim], Sulfonamide derivatives, and Tape  Review of Systems   Review of Systems  Constitutional: Negative for chills and fever.    HENT: Negative for sore throat.   Eyes: Negative for redness.  Respiratory: Negative for shortness of breath.   Cardiovascular: Positive for chest pain and palpitations. Negative for leg swelling.  Gastrointestinal: Negative for abdominal pain and vomiting.  Genitourinary: Negative for flank pain.  Musculoskeletal: Negative for back pain and neck pain.  Skin: Negative for rash.  Neurological: Negative for headaches.  Hematological: Does not bruise/bleed easily.  Psychiatric/Behavioral: Negative for confusion.    Physical Exam Updated Vital Signs BP (!) 192/67   Pulse 66   Temp 98.1 F (36.7 C) (Oral)   Resp 16   SpO2 100%   Physical Exam Vitals and nursing note reviewed.  Constitutional:  Appearance: Normal appearance. She is well-developed.  HENT:     Head: Atraumatic.     Nose: Nose normal.     Mouth/Throat:     Mouth: Mucous membranes are moist.  Eyes:     General: No scleral icterus.    Conjunctiva/sclera: Conjunctivae normal.  Neck:     Trachea: No tracheal deviation.  Cardiovascular:     Rate and Rhythm: Normal rate and regular rhythm.     Pulses: Normal pulses.     Heart sounds: Normal heart sounds. No murmur heard.  No friction rub. No gallop.   Pulmonary:     Effort: Pulmonary effort is normal. No respiratory distress.     Breath sounds: Normal breath sounds.  Abdominal:     General: Bowel sounds are normal. There is no distension.     Palpations: Abdomen is soft.     Tenderness: There is no abdominal tenderness. There is no guarding.  Genitourinary:    Comments: No cva tenderness.  Musculoskeletal:        General: No swelling.     Cervical back: Normal range of motion and neck supple. No rigidity. No muscular tenderness.  Skin:    General: Skin is warm and dry.     Findings: No rash.  Neurological:     Mental Status: She is alert.     Comments: Alert, speech normal.   Psychiatric:        Mood and Affect: Mood normal.     ED Results /  Procedures / Treatments   Labs (all labs ordered are listed, but only abnormal results are displayed) Results for orders placed or performed during the hospital encounter of 41/66/06  Basic metabolic panel  Result Value Ref Range   Sodium 139 135 - 145 mmol/L   Potassium 4.0 3.5 - 5.1 mmol/L   Chloride 106 98 - 111 mmol/L   CO2 24 22 - 32 mmol/L   Glucose, Bld 147 (H) 70 - 99 mg/dL   BUN 11 8 - 23 mg/dL   Creatinine, Ser 0.86 0.44 - 1.00 mg/dL   Calcium 8.5 (L) 8.9 - 10.3 mg/dL   GFR calc non Af Amer >60 >60 mL/min   GFR calc Af Amer >60 >60 mL/min   Anion gap 9 5 - 15  CBC  Result Value Ref Range   WBC 5.9 4.0 - 10.5 K/uL   RBC 3.80 (L) 3.87 - 5.11 MIL/uL   Hemoglobin 12.5 12.0 - 15.0 g/dL   HCT 38.0 36 - 46 %   MCV 100.0 80.0 - 100.0 fL   MCH 32.9 26.0 - 34.0 pg   MCHC 32.9 30.0 - 36.0 g/dL   RDW 13.3 11.5 - 15.5 %   Platelets 193 150 - 400 K/uL   nRBC 0.0 0.0 - 0.2 %  Troponin I (High Sensitivity)  Result Value Ref Range   Troponin I (High Sensitivity) 7 <18 ng/L  Troponin I (High Sensitivity)  Result Value Ref Range   Troponin I (High Sensitivity) 5 <18 ng/L   DG Chest 2 View  Result Date: 03/12/2020 CLINICAL DATA:  Chest pain. EXAM: CHEST - 2 VIEW COMPARISON:  Multiple priors, most recent 07/15/2019 FINDINGS: The heart size and mediastinal contours are within normal limits. Both lungs are clear. No pleural effusions or pneumothorax. No acute osseous abnormality. Right axillary clips. IMPRESSION: No acute cardiopulmonary disease. Electronically Signed   By: Margaretha Sheffield MD   On: 03/12/2020 10:56   VAS US CAROTID  Result Date:  02/28/2020 Carotid Arterial Duplex Study Indications:       Bilateral carotid artery stenosis. Patient c/o headaches,                    dizziness and presyncope episodes if she gets up too quickly. Risk Factors:      Hypertension, hyperlipidemia, past history of smoking,                    coronary artery disease. Other Factors:     COPD.  Comparison Study:  In 02/2019, a carotid duplex showed velocities 427/98 cm/s in                    the RICA, s/p endarterectomy and 527/78 cm/s in the LICA. Performing Technologist: Sharlett Iles RVT  Examination Guidelines: A complete evaluation includes B-mode imaging, spectral Doppler, color Doppler, and power Doppler as needed of all accessible portions of each vessel. Bilateral testing is considered an integral part of a complete examination. Limited examinations for reoccurring indications may be performed as noted.  Right Carotid Findings: +----------+--------+--------+--------+------------------+---------+           PSV cm/sEDV cm/sStenosisPlaque DescriptionComments  +----------+--------+--------+--------+------------------+---------+ CCA Prox  167     15                                turbulent +----------+--------+--------+--------+------------------+---------+ CCA Distal72      11              heterogenous                +----------+--------+--------+--------+------------------+---------+ ICA Prox  367     66      60-79%  heterogenous                +----------+--------+--------+--------+------------------+---------+ ICA Mid   155     22                                turbulent +----------+--------+--------+--------+------------------+---------+ ICA Distal97      19                                tortuous  +----------+--------+--------+--------+------------------+---------+ ECA       120     9               heterogenous                +----------+--------+--------+--------+------------------+---------+ +----------+--------+-------+----------------------+---------------------------+           PSV cm/sEDV cmsDescribe              Arm Pressure (mmHG)         +----------+--------+-------+----------------------+---------------------------+ [id number]            Stenotic and turbulentlymph node removed; h/o                                                     breast cancer               +----------+--------+-------+----------------------+---------------------------+ +---------+--------+--+--------+-+ VertebralPSV cm/s48EDV cm/s9 +---------+--------+--+--------+-+ Velocities in the RICA are elevated and have decreased compared to the prior exam. Left Carotid Findings: +----------+--------+--------+--------+------------------+---------+  PSV cm/sEDV cm/sStenosisPlaque DescriptionComments  +----------+--------+--------+--------+------------------+---------+ CCA Prox  102     14                                          +----------+--------+--------+--------+------------------+---------+ CCA Distal74      13                                          +----------+--------+--------+--------+------------------+---------+ ICA Prox  283     59                                          +----------+--------+--------+--------+------------------+---------+ ICA Mid   291     42      40-59%                    turbulent +----------+--------+--------+--------+------------------+---------+ ICA Distal98      24                                tortuous  +----------+--------+--------+--------+------------------+---------+ ECA       182     25      >50%    heterogenous                +----------+--------+--------+--------+------------------+---------+ +----------+--------+--------+----------------+-------------------+           PSV cm/sEDV cm/sDescribe        Arm Pressure (mmHG) +----------+--------+--------+----------------+-------------------+ [id number]             Multiphasic, [id number]                 +----------+--------+--------+----------------+-------------------+ +---------+--------+--+--------+--+---------+ VertebralPSV cm/s63EDV cm/s12Antegrade +---------+--------+--+--------+--+---------+ Velocities in the LICA are elevated and essentially stable compared to the prior exam.  Summary:  Right Carotid: Velocities in the right ICA are consistent with a 60-79%                stenosis. Non-hemodynamically significant plaque <50% noted in                the CCA. Left Carotid: Velocities in the left ICA are consistent with a 40-59% stenosis.               The ECA appears >50% stenosed. Vertebrals:  Bilateral vertebral arteries demonstrate antegrade flow. Subclavians: Normal flow hemodynamics were seen in bilateral subclavian              arteries. *See table(s) above for measurements and observations. Suggest follow up study in 12 months. Electronically signed by Kathlyn Sacramento MD on 02/28/2020 at 8:02:56 AM.    Final     EKG EKG Interpretation  Date/Time:  Saturday March 12 2020 10:19:27 EDT Ventricular Rate:  62 PR Interval:  116 QRS Duration: 100 QT Interval:  424 QTC Calculation: 430 R Axis:   22 Text Interpretation: Normal sinus rhythm `no acute st/t changes Confirmed by Lajean Saver [phone]) on 03/12/2020 12:45:03 PM   Radiology DG Chest 2 View  Result Date: 03/12/2020 CLINICAL DATA:  Chest pain. EXAM: CHEST - 2 VIEW COMPARISON:  Multiple priors, most recent 07/15/2019 FINDINGS: The heart size and mediastinal contours are within normal limits. Both lungs are clear. No pleural  effusions or pneumothorax. No acute osseous abnormality. Right axillary clips. IMPRESSION: No acute cardiopulmonary disease. Electronically Signed   By: Margaretha Sheffield MD   On: 03/12/2020 10:56    Procedures Procedures (including critical care time)  Medications Ordered in ED Medications  potassium chloride SA (KLOR-CON) CR tablet 40 mEq (has no administration in time range)    ED Course  I have reviewed the triage vital signs and the nursing notes.  Pertinent labs & imaging results that were available during my care of the patient were reviewed by me and considered in my medical decision making (see chart for details).    MDM Rules/Calculators/A&P                         Iv ns.  Continuous pulse ox and cardiac monitoring - remains in nsr. Labs sent. Cxr.   Reviewed nursing notes and prior charts for additional history.  Prior cardiac cath reviewed - single 85% lesion noted, other nonobstructive cad.   Initial labs reviewed/interpreted by me - trop normal. Chem normal. Hemoglobin normal.  CXR reviewed/interpreted by me - no pna.   Additional labs reviewed/interpreted by me - delta trop normal, not significantly increased.   Pt remains in sinus rhythm.     On review prior charts, hx htn, not currently on meds for same.   Patient indicates 'back in afib' saying 'there it goes again' approximately once every 30 seconds or so - pt placed back on monitor - patient remains in sinus rhythm, but is having occasional unifocal pvcs that are exactly correlating with her symptoms. Discussed pvcs w pt.   Recheck pt, no cp or sob. Nsr. Pt currently appears stable for d/c.   Rec close cardiology f/u.  Return precautions provided.     Final Clinical Impression(s) / ED Diagnoses Final diagnoses:  None    Rx / DC Orders ED Discharge Orders    None         Lajean Saver, MD 03/12/20 1506

## 2020-03-12 NOTE — Discharge Instructions (Signed)
It was our pleasure to provide your ER care today - we hope that you feel better.  Your are not in a-fib, but you are having occasional pvcs, that are causing the sense of palpitations or irregular heart beating - see attached information about pvcs.  Follow up with your cardiologist in the next 1-2 weeks.   Your blood pressure is high today - eat heart health diet, plenty of fruits and vegetables, limit salt intake, and follow up with your doctor in the next 1-2 weeks.   Return to ER if worse, new symptoms, persistent fast heart beating, chest pain, trouble breathing, fainting, or other concern.

## 2020-03-15 ENCOUNTER — Other Ambulatory Visit: Payer: Self-pay

## 2020-03-15 ENCOUNTER — Encounter: Payer: Self-pay | Admitting: Cardiovascular Disease

## 2020-03-15 ENCOUNTER — Ambulatory Visit (INDEPENDENT_AMBULATORY_CARE_PROVIDER_SITE_OTHER): Payer: Medicare Other | Admitting: Cardiovascular Disease

## 2020-03-15 DIAGNOSIS — I10 Essential (primary) hypertension: Secondary | ICD-10-CM | POA: Diagnosis not present

## 2020-03-15 DIAGNOSIS — E785 Hyperlipidemia, unspecified: Secondary | ICD-10-CM

## 2020-03-15 DIAGNOSIS — I6523 Occlusion and stenosis of bilateral carotid arteries: Secondary | ICD-10-CM | POA: Diagnosis not present

## 2020-03-15 DIAGNOSIS — I4891 Unspecified atrial fibrillation: Secondary | ICD-10-CM | POA: Diagnosis not present

## 2020-03-15 DIAGNOSIS — I2510 Atherosclerotic heart disease of native coronary artery without angina pectoris: Secondary | ICD-10-CM | POA: Diagnosis not present

## 2020-03-15 MED ORDER — METOPROLOL SUCCINATE ER 25 MG PO TB24: 12.5000 mg | ORAL_TABLET | Freq: Every day | ORAL | 3 refills | Status: DC

## 2020-03-15 NOTE — Assessment & Plan Note (Signed)
History of noncritical CAD by cardiac catheterization performed by Dr. Martinique 10/11/2017, this was unchanged from her cath 10 years previously.  She did have an ostial small ramus branch stenosis thought best to medically treated.  She denies chest pain.

## 2020-03-15 NOTE — Patient Instructions (Signed)
Medication Instructions:   START METOPROLOL SUCC ER 12.5 MG ONCE DAILY= 1/2 OF THE 25 MG TABLETS ONCE DAILY  *If you need a refill on your cardiac medications before your next appointment, please call your pharmacy*   Follow-Up: At [facility], you and your health needs are our priority.  As part of our continuing mission to provide you with exceptional heart care, we have created designated Provider Care Teams.  These Care Teams include your primary Cardiologist (physician) and Advanced Practice Providers (APPs -  Physician Assistants and Nurse Practitioners) who all work together to provide you with the care you need, when you need it.  We recommend signing up for the patient portal called "MyChart".  Sign up information is provided on this After Visit Summary.  MyChart is used to connect with patients for Virtual Visits (Telemedicine).  Patients are able to view lab/test results, encounter notes, upcoming appointments, etc.  Non-urgent messages can be sent to your provider as well.   To learn more about what you can do with MyChart, go to [email or web address].    Your next appointment:   3 month(s)  The format for your next appointment:   In Person  Provider:   You will see one of the following Advanced Practice Providers on your designated Care Team:     Coletta Memos, FNP  [place]

## 2020-03-15 NOTE — Assessment & Plan Note (Signed)
History of dyslipidemia on statin therapy with lipid profile performed 07/15/2019 revealing total cholesterol 133, LDL 64 and HDL 59.

## 2020-03-15 NOTE — Assessment & Plan Note (Signed)
History of essential hypertension a blood pressure measured today 160/78.  She did check her blood pressure this morning which was in the 110/70 range.  She has an element of "white coat hypertension".  She is not on antihypertensive medications.

## 2020-03-15 NOTE — Assessment & Plan Note (Signed)
History of PAF maintaining sinus rhythm on Eliquis oral anticoagulation.  Left episode of A. fib was in the ER 07/15/2019.  She did spontaneously convert to sinus rhythm.  She was recently seen in the ER for tachypalpitations.  She thought she was in A. fib but was not.  She did have PVCs on the monitor.  I am going to begin her on low-dose beta-blockade (Toprol-XL 12.5 mg a day).

## 2020-03-15 NOTE — Progress Notes (Signed)
03/15/2020 Brittany Kirk   Apr 09, 1946  [id number]  Primary Physician Pickard, Cammie Mcgee, MD Primary Cardiologist: Lorretta Harp MD [name], [place]  HPI:  Brittany Kirk is a 74 y.o.    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 She was referred by Dr. Dennard Schaumann for cardiovascular evaluation because of chest pain. I last saw her in the office 10/20/2019. Risk factors include 25 pack years of tobacco abuse having quit 3 years ago. She has treated hypertension as well as a strong family history of heart disease with multiple siblings who died at a premature age of myocardial infarction's. She has never had a heart attack or stroke. She has had a remote cardiac catheterization by Dr. Lia Foyer but no intervention was performed. She was complaining of some substernal chest pain with chronic nausea. This has since some sided after beginning on a proton pump inhibitor.She had a negative stress test 01/24/16 and again during a recent consultation for chest pain 01/05/17.She has had upper and lower endoscopy for abdominal pain and nausea which was unrevealing.  She had diagnostic coronary angiography performed by Dr. Martinique 5/19 showing moderately severe ramus branch ostial disease with noncritical disease otherwise a normal LV function. This was unchanged from prior cath performed in 2010 it was thought that her chest pain was noncardiac in nature.  She was seen in the emergency room on 07/15/2019 for A. fib with RVR.  She converted spontaneously to sinus rhythm and was begun on apixaban by Dr. Harrell Gave.  She has had a few episodes of brief AF since but for the most part is asymptomatic from this.    Since I saw her 6 months ago she was seen in the ER once for tachypalpitations which she thought was recurrent A. fib but actually she was in sinus rhythm.  She did have PVCs on the monitor.   Current Meds    Medication Sig  . ascorbic acid (VITAMIN C) 500 MG tablet Take 500 mg by mouth daily.  Marland Kitchen atorvastatin (LIPITOR) 40 MG tablet TAKE 1 TABLET BY MOUTH EVERYDAY AT BEDTIME  . cholecalciferol (VITAMIN D3) 25 MCG (1000 UNIT) tablet Take 1,000 Units by mouth daily.  . Cyanocobalamin (B-12) 2500 MCG TABS Take 2,500 mcg by mouth daily.  . diazepam (VALIUM) 5 MG tablet Take 5 mg by mouth daily as needed for anxiety.   Marland Kitchen ELIQUIS 5 MG TABS tablet TAKE 1 TABLET BY MOUTH TWICE A DAY  . omeprazole (PRILOSEC) 20 MG capsule Take 20 mg by mouth daily.  . traZODone (DESYREL) 50 MG tablet TAKE 1/2 TO 1 TABLET BY MOUTH AT BEDTIME AS NEEDED FOR SLEEP  . zinc gluconate 50 MG tablet Take 50 mg by mouth daily.     Allergies  Allergen Reactions  . Penicillins Anaphylaxis, Itching, Swelling and Rash    Has patient had a PCN reaction causing immediate rash, facial/tongue/throat swelling, SOB or lightheadedness with hypotension: Yes Has patient had a PCN reaction causing severe rash involving mucus membranes or skin necrosis: No Has patient had a PCN reaction that required hospitalization: Yes Has patient had a PCN reaction occurring within the last 10 years: No If all of the above answers are "NO", then may proceed with Cephalosporin use.   . Cefaclor Itching, Swelling and Rash  . Latex Itching and Rash  . Metronidazole Itching, Swelling and Rash  . Naproxen Itching, Swelling and  Rash  . Nsaids Itching, Swelling and Rash    Ibuprofen IS tolerated  . Septra [Bactrim] Itching, Swelling and Rash  . Sulfonamide Derivatives Itching, Swelling and Rash  . Tape Itching and Rash    Social History   Socioeconomic History  . Marital status: Married    Spouse name: Not on file  . Number of children: 1  . Years of education: Not on file  . Highest education level: Not on file  Occupational History  . Occupation: retired Clinical cytogeneticist: RETIRED  Tobacco Use  . Smoking status: Former Smoker     Packs/day: 0.50    Years: 46.00    Pack years: 23.00    Types: Cigarettes    Quit date: 02/13/2011    Years since quitting: 9.0  . Smokeless tobacco: Never Used  Vaping Use  . Vaping Use: Never used  Substance and Sexual Activity  . Alcohol use: No  . Drug use: No  . Sexual activity: Not on file  Other Topics Concern  . Not on file  Social History Narrative  . Not on file   Social Determinants of Health   Financial Resource Strain: Low Risk   . Difficulty of Paying Living Expenses: Not hard at all  Food Insecurity:   . Worried About Charity fundraiser in the Last Year: Not on file  . Ran Out of Food in the Last Year: Not on file  Transportation Needs:   . Lack of Transportation (Medical): Not on file  . Lack of Transportation (Non-Medical): Not on file  Physical Activity:   . Days of Exercise per Week: Not on file  . Minutes of Exercise per Session: Not on file  Stress:   . Feeling of Stress : Not on file  Social Connections:   . Frequency of Communication with Friends and Family: Not on file  . Frequency of Social Gatherings with Friends and Family: Not on file  . Attends Religious Services: Not on file  . Active Member of Clubs or Organizations: Not on file  . Attends Archivist Meetings: Not on file  . Marital Status: Not on file  Intimate Partner Violence:   . Fear of Current or Ex-Partner: Not on file  . Emotionally Abused: Not on file  . Physically Abused: Not on file  . Sexually Abused: Not on file     Review of Systems: General: negative for chills, fever, night sweats or weight changes.  Cardiovascular: negative for chest pain, dyspnea on exertion, edema, orthopnea, palpitations, paroxysmal nocturnal dyspnea or shortness of breath Dermatological: negative for rash Respiratory: negative for cough or wheezing Urologic: negative for hematuria Abdominal: negative for nausea, vomiting, diarrhea, bright red blood per rectum, melena, or  hematemesis Neurologic: negative for visual changes, syncope, or dizziness All other systems reviewed and are otherwise negative except as noted above.    Blood pressure (!) 160/78, pulse 64, height 4\' 11"  (1.499 m), weight 116 lb 6.4 oz (52.8 kg).  General appearance: alert and no distress Neck: no adenopathy, no JVD, supple, symmetrical, trachea midline, thyroid not enlarged, symmetric, no tenderness/mass/nodules and Bilateral carotid bruits Lungs: clear to auscultation bilaterally Heart: regular rate and rhythm, S1, S2 normal, no murmur, click, rub or gallop Extremities: extremities normal, atraumatic, no cyanosis or edema Pulses: 2+ and symmetric Skin: Skin color, texture, turgor normal. No rashes or lesions Neurologic: Alert and oriented X 3, normal strength and tone. Normal symmetric reflexes. Normal coordination and gait  EKG not performed today  ASSESSMENT AND PLAN:   CAD in native artery History of noncritical CAD by cardiac catheterization performed by Dr. Martinique 10/11/2017, this was unchanged from her cath 10 years previously.  She did have an ostial small ramus branch stenosis thought best to medically treated.  She denies chest pain.  Dyslipidemia, goal LDL below 70 History of dyslipidemia on statin therapy with lipid profile performed 07/15/2019 revealing total cholesterol 133, LDL 64 and HDL 59.  Essential hypertension History of essential hypertension a blood pressure measured today 160/78.  She did check her blood pressure this morning which was in the 110/70 range.  She has an element of "white coat hypertension".  She is not on antihypertensive medications.  Bilateral carotid artery disease (HCC) History of moderate bilateral ICA stenosis by duplex ultrasound recently checked 02/26/2020.  She will need this rechecked in 1 year.  Atrial fibrillation with RVR (HCC) History of PAF maintaining sinus rhythm on Eliquis oral anticoagulation.  Left episode of A. fib was in the  ER 07/15/2019.  She did spontaneously convert to sinus rhythm.  She was recently seen in the ER for tachypalpitations.  She thought she was in A. fib but was not.  She did have PVCs on the monitor.  I am going to begin her on low-dose beta-blockade (Toprol-XL 12.5 mg a day).      Lorretta Harp MD FACP,FACC,FAHA, [facility] 03/15/2020 2:59 PM

## 2020-03-15 NOTE — Assessment & Plan Note (Signed)
History of moderate bilateral ICA stenosis by duplex ultrasound recently checked 02/26/2020.  She will need this rechecked in 1 year.

## 2020-03-18 DIAGNOSIS — Z85828 Personal history of other malignant neoplasm of skin: Secondary | ICD-10-CM | POA: Diagnosis not present

## 2020-03-18 DIAGNOSIS — L820 Inflamed seborrheic keratosis: Secondary | ICD-10-CM | POA: Diagnosis not present

## 2020-04-05 ENCOUNTER — Other Ambulatory Visit: Payer: Self-pay

## 2020-04-05 ENCOUNTER — Ambulatory Visit (INDEPENDENT_AMBULATORY_CARE_PROVIDER_SITE_OTHER): Payer: Medicare Other | Admitting: Urology

## 2020-04-05 ENCOUNTER — Encounter: Payer: Self-pay | Admitting: Urology

## 2020-04-05 VITALS — BP 164/62 | HR 60 | Temp 97.6°F | Ht 59.0 in | Wt 116.4 lb

## 2020-04-05 DIAGNOSIS — R319 Hematuria, unspecified: Secondary | ICD-10-CM

## 2020-04-05 DIAGNOSIS — I2510 Atherosclerotic heart disease of native coronary artery without angina pectoris: Secondary | ICD-10-CM

## 2020-04-05 LAB — URINALYSIS, ROUTINE W REFLEX MICROSCOPIC
Bilirubin, UA: NEGATIVE
Glucose, UA: NEGATIVE
Leukocytes,UA: NEGATIVE
Nitrite, UA: NEGATIVE
Protein,UA: NEGATIVE
Specific Gravity, UA: 1.025 (ref 1.005–1.030)
Urobilinogen, Ur: 1 mg/dL (ref 0.2–1.0)
pH, UA: 5 (ref 5.0–7.5)

## 2020-04-05 LAB — MICROSCOPIC EXAMINATION
Renal Epithel, UA: NONE SEEN /hpf
WBC, UA: NONE SEEN /hpf (ref 0–5)

## 2020-04-05 MED ORDER — CIPROFLOXACIN HCL 500 MG PO TABS
500.0000 mg | ORAL_TABLET | Freq: Once | ORAL | Status: AC
  Administered 2020-04-05: 500 mg via ORAL

## 2020-04-05 NOTE — Progress Notes (Signed)
History of Present Illness: Brittany Kirk is a 74 y.o. year old female who presents for follow-up of hematuria.  She is due to have cystoscopy today.   9.20.2021: Brittany Kirk is a 74 y.o. year old female here as a new patient. Pt experiencing gross hematuria starting in mid-July. Pt notes urinary/abdominal pressure associated with her hematuria. She reports 3 episodes of bright blood followed by occasional recurrent tint to her urine. ~ 40 pack year h/o smoking, quit 9 years ago. 8.2.21: CT urogram: No renal or ureteral stones.  No hydronephrosis. Heavily calcified aorta and iliac vessels. Moderate to large stool burden in the colon.   Past Medical History:  Diagnosis Date  . Acid reflux   . Allergic rhinitis   . Anxiety   . Asymptomatic bilateral carotid artery stenosis    40-59% (02/2018)  . Atrial fibrillation ([place]) 07/2019  . Breast cancer ([place]) 2015   Right Breast Cancer  . CAD (coronary artery disease)   . Depression   . Emphysema    no longer uses inhalers  . Gallstones    nausea, pain upper right abdomen  . GERD (gastroesophageal reflux disease)   . H/O hiatal hernia   . Hiatal hernia   . History of skin cancer   . Hyperlipidemia   . Hypertension   . Multinodular goiter (nontoxic)   . Scoliosis   . Skin cancer   . Tobacco user   . Wears dentures    top    Past Surgical History:  Procedure Laterality Date  . BREAST LUMPECTOMY Right 2015  . CHOLECYSTECTOMY  07/08/2012   Procedure: LAPAROSCOPIC CHOLECYSTECTOMY WITH INTRAOPERATIVE CHOLANGIOGRAM;  Surgeon: Gayland Curry, MD,FACS;  Location: [facility];  Service: General;  Laterality: N/A;  Laparoscopic Cholecystectomy with Intraoperative Cholangiogram  . COLONOSCOPY    . ESOPHAGOSCOPY N/A 05/29/2017   Procedure: ESOPHAGOSCOPY;  Surgeon: Clarene Essex, MD;  Location: [facility];  Service: Endoscopy;  Laterality: N/A;  botox injection  . HAND SURGERY Left    wrist  . LEFT HEART CATH AND CORONARY ANGIOGRAPHY  N/A 10/11/2017   Procedure: LEFT HEART CATH AND CORONARY ANGIOGRAPHY;  Surgeon: Martinique, Peter M, MD;  Location: [facility];  Service: Cardiovascular;  Laterality: N/A;  . TONSILECTOMY, ADENOIDECTOMY, BILATERAL MYRINGOTOMY AND TUBES    . TUBAL LIGATION      Home Medications:  (Not in a hospital admission)   Allergies:  Allergies  Allergen Reactions  . Penicillins Anaphylaxis, Itching, Swelling and Rash    Has patient had a PCN reaction causing immediate rash, facial/tongue/throat swelling, SOB or lightheadedness with hypotension: Yes Has patient had a PCN reaction causing severe rash involving mucus membranes or skin necrosis: No Has patient had a PCN reaction that required hospitalization: Yes Has patient had a PCN reaction occurring within the last 10 years: No If all of the above answers are "NO", then may proceed with Cephalosporin use.   . Cefaclor Itching, Swelling and Rash  . Latex Itching and Rash  . Metronidazole Itching, Swelling and Rash  . Naproxen Itching, Swelling and Rash  . Nsaids Itching, Swelling and Rash    Ibuprofen IS tolerated  . Septra [Bactrim] Itching, Swelling and Rash  . Sulfonamide Derivatives Itching, Swelling and Rash  . Tape Itching and Rash    Family History  Problem Relation Age of Onset  . Arthritis Mother   . Pancreatic cancer Mother   . Heart disease Other        5  brothers and 2 sister  . Diverticulitis Sister   . Heart disease Sister   . Aplastic anemia Other     Social History:  reports that she quit smoking about 9 years ago. Her smoking use included cigarettes. She has a 23.00 pack-year smoking history. She has never used smokeless tobacco. She reports that she does not drink alcohol and does not use drugs.  ROS: A complete review of systems was performed.  All systems are negative except for pertinent findings as noted.  Physical Exam:  Vital signs in last 24 hours: @VSRANGES @ General:  Alert and oriented, No acute  distress HEENT: Normocephalic, atraumatic Neck: No JVD or lymphadenopathy Cardiovascular: Regular rate  Lungs: Normal inspiratory/expiratory excursion Abdomen: Soft, nontender, nondistended, no abdominal masses Back: No CVA tenderness Extremities: No edema Neurologic: Grossly intact  Cystoscopy Procedure Note:  Indication: Gross hematuria  After informed consent and discussion of the procedure and its risks, Brittany Kirk was positioned and prepped in the standard fashion.  Cystoscopy was performed with a flexible cystoscope.   Findings:  Bladder neck: Normal  Ureteral orifices: Normal configuration, location Bladder: No urothelial abnormality seen  The patient tolerated the procedure well.   I have reviewed prior pt notes  I have reviewed notes from referring/previous physicians  I have reviewed urinalysis results  I have independently reviewed prior imaging     Impression/Assessment:  Gross hematuria with negative evaluation so far, lacking urine cytology  Plan:  I will send urine for cytology  I would like her to come back in about 4 months to recheck  [name] 04/05/2020, 12:44 PM  Brittany Kirk. Brittany Cihlar MD

## 2020-04-05 NOTE — Progress Notes (Signed)
Urological Symptom Review  Patient is experiencing the following symptoms: Frequent urination Get up at night to urinate Stream starts and stops   Review of Systems  Gastrointestinal (upper)  : Nausea  Gastrointestinal (lower) : Negative for lower GI symptoms  Constitutional : Fatigue  Skin: Negative for skin symptoms  Eyes: Negative for eye symptoms  Ear/Nose/Throat : Negative for Ear/Nose/Throat symptoms  Hematologic/Lymphatic: Easy bruising  Cardiovascular : Negative for cardiovascular symptoms  Respiratory : Negative for respiratory symptoms  Endocrine: Negative for endocrine symptoms  Musculoskeletal: Negative for musculoskeletal symptoms  Neurological: Negative for neurological symptoms  Psychologic: Negative for psychiatric symptoms

## 2020-04-06 LAB — CYTOLOGY, URINE

## 2020-04-07 NOTE — Progress Notes (Signed)
Sent via mychart

## 2020-04-08 ENCOUNTER — Other Ambulatory Visit: Payer: Self-pay | Admitting: Cardiovascular Disease

## 2020-04-11 NOTE — Progress Notes (Signed)
Sent via mychart

## 2020-04-18 ENCOUNTER — Ambulatory Visit: Payer: Medicare Other | Admitting: General Practice

## 2020-05-04 ENCOUNTER — Telehealth: Payer: Self-pay

## 2020-05-16 ENCOUNTER — Ambulatory Visit (INDEPENDENT_AMBULATORY_CARE_PROVIDER_SITE_OTHER): Payer: Medicare Other | Admitting: Family Medicine

## 2020-05-16 ENCOUNTER — Other Ambulatory Visit: Payer: Self-pay

## 2020-05-16 ENCOUNTER — Ambulatory Visit
Admission: RE | Admit: 2020-05-16 | Discharge: 2020-05-16 | Disposition: A | Discharge status: Home / Self Care | Payer: Medicare Other | Source: Ambulatory Visit | Attending: Family Medicine | Admitting: Family Medicine

## 2020-05-16 VITALS — BP 128/68 | HR 88 | Temp 97.6°F | Resp 15 | Ht 59.0 in | Wt 116.0 lb

## 2020-05-16 DIAGNOSIS — M5431 Sciatica, right side: Secondary | ICD-10-CM

## 2020-05-16 DIAGNOSIS — M7061 Trochanteric bursitis, right hip: Secondary | ICD-10-CM | POA: Diagnosis not present

## 2020-05-16 DIAGNOSIS — M25551 Pain in right hip: Secondary | ICD-10-CM

## 2020-05-16 DIAGNOSIS — I2510 Atherosclerotic heart disease of native coronary artery without angina pectoris: Secondary | ICD-10-CM | POA: Diagnosis not present

## 2020-05-16 DIAGNOSIS — M545 Low back pain, unspecified: Secondary | ICD-10-CM | POA: Diagnosis not present

## 2020-05-16 DIAGNOSIS — M25552 Pain in left hip: Secondary | ICD-10-CM

## 2020-05-16 NOTE — Progress Notes (Signed)
Subjective:    Patient ID: Brittany Kirk, female    DOB: 07-13-1945, 74 y.o.   MRN: [id number]  HPI  Patient is currently on Eliquis for atrial fibrillation.  She recently fell and landed on her right lateral hip.  Ever since that time she has had pain radiating from the posterior aspect of her hip down to the lateral side of her hip over the greater trochanteric bursa which is tender to palpation.  However occasionally the pain will shoot down the posterior lateral aspect of her right leg down below the knee into her foot.  She is also having terrible pain in her left hip.  The pain in the left hip however is anterior.  It is not over the hip joint itself but is more closely linked near the pubic symphysis.  She does not have pain with flexion of the left hip or external or internal rotation however she does have pain with weightbearing in that area and with palpation.  She denies any saddle anesthesia or bowel or bladder incontinence or leg weakness however she does have pain with ambulation Past Medical History:  Diagnosis Date  . Acid reflux   . Allergic rhinitis   . Anxiety   . Asymptomatic bilateral carotid artery stenosis    40-59% (02/2018)  . Atrial fibrillation ([place]) 07/2019  . Breast cancer ([place]) 2015   Right Breast Cancer  . CAD (coronary artery disease)   . Depression   . Emphysema    no longer uses inhalers  . Gallstones    nausea, pain upper right abdomen  . GERD (gastroesophageal reflux disease)   . H/O hiatal hernia   . Hiatal hernia   . History of skin cancer   . Hyperlipidemia   . Hypertension   . Multinodular goiter (nontoxic)   . Scoliosis   . Skin cancer   . Tobacco user   . Wears dentures    top   Past Surgical History:  Procedure Laterality Date  . BREAST LUMPECTOMY Right 2015  . CHOLECYSTECTOMY  07/08/2012   Procedure: LAPAROSCOPIC CHOLECYSTECTOMY WITH INTRAOPERATIVE CHOLANGIOGRAM;  Surgeon: Gayland Curry, MD,FACS;  Location: [facility];  Service:  General;  Laterality: N/A;  Laparoscopic Cholecystectomy with Intraoperative Cholangiogram  . COLONOSCOPY    . ESOPHAGOSCOPY N/A 05/29/2017   Procedure: ESOPHAGOSCOPY;  Surgeon: Clarene Essex, MD;  Location: [facility];  Service: Endoscopy;  Laterality: N/A;  botox injection  . HAND SURGERY Left    wrist  . LEFT HEART CATH AND CORONARY ANGIOGRAPHY N/A 10/11/2017   Procedure: LEFT HEART CATH AND CORONARY ANGIOGRAPHY;  Surgeon: Martinique, Peter M, MD;  Location: [facility];  Service: Cardiovascular;  Laterality: N/A;  . TONSILECTOMY, ADENOIDECTOMY, BILATERAL MYRINGOTOMY AND TUBES    . TUBAL LIGATION     Current Outpatient Medications on File Prior to Visit  Medication Sig Dispense Refill  . ascorbic acid (VITAMIN C) 500 MG tablet Take 500 mg by mouth daily.    Marland Kitchen atorvastatin (LIPITOR) 40 MG tablet TAKE 1 TABLET BY MOUTH EVERYDAY AT BEDTIME 90 tablet 2  . cholecalciferol (VITAMIN D3) 25 MCG (1000 UNIT) tablet Take 1,000 Units by mouth daily.    . Cyanocobalamin (B-12) 2500 MCG TABS Take 2,500 mcg by mouth daily.    . diazepam (VALIUM) 5 MG tablet Take 5 mg by mouth daily as needed for anxiety.   3  . ELIQUIS 5 MG TABS tablet TAKE 1 TABLET BY MOUTH TWICE A DAY 180 tablet 1  .  metoprolol succinate (TOPROL XL) 25 MG 24 hr tablet Take 0.5 tablets (12.5 mg total) by mouth daily. 45 tablet 3  . omeprazole (PRILOSEC) 20 MG capsule Take 20 mg by mouth daily.    . traZODone (DESYREL) 50 MG tablet TAKE 1/2 TO 1 TABLET BY MOUTH AT BEDTIME AS NEEDED FOR SLEEP 90 tablet 3  . zinc gluconate 50 MG tablet Take 50 mg by mouth daily.    . ciprofloxacin (CIPRO) 250 MG tablet Take 1 tablet (250 mg total) by mouth 2 (two) times daily. (Patient not taking: Reported on 03/01/2020) 10 tablet 0   No current facility-administered medications on file prior to visit.   Allergies  Allergen Reactions  . Penicillins Anaphylaxis, Itching, Swelling and Rash    Has patient had a PCN reaction causing immediate rash,  facial/tongue/throat swelling, SOB or lightheadedness with hypotension: Yes Has patient had a PCN reaction causing severe rash involving mucus membranes or skin necrosis: No Has patient had a PCN reaction that required hospitalization: Yes Has patient had a PCN reaction occurring within the last 10 years: No If all of the above answers are "NO", then may proceed with Cephalosporin use.   . Cefaclor Itching, Swelling and Rash  . Latex Itching and Rash  . Metronidazole Itching, Swelling and Rash  . Naproxen Itching, Swelling and Rash  . Nsaids Itching, Swelling and Rash    Ibuprofen IS tolerated  . Septra [Bactrim] Itching, Swelling and Rash  . Sulfonamide Derivatives Itching, Swelling and Rash  . Tape Itching and Rash   Social History   Socioeconomic History  . Marital status: Married    Spouse name: Not on file  . Number of children: 1  . Years of education: Not on file  . Highest education level: Not on file  Occupational History  . Occupation: retired Clinical cytogeneticist: RETIRED  Tobacco Use  . Smoking status: Former Smoker    Packs/day: 0.50    Years: 46.00    Pack years: 23.00    Types: Cigarettes    Quit date: 02/13/2011    Years since quitting: 9.2  . Smokeless tobacco: Never Used  Vaping Use  . Vaping Use: Never used  Substance and Sexual Activity  . Alcohol use: No  . Drug use: No  . Sexual activity: Not on file  Other Topics Concern  . Not on file  Social History Narrative  . Not on file   Social Determinants of Health   Financial Resource Strain: Low Risk   . Difficulty of Paying Living Expenses: Not hard at all  Food Insecurity:   . Worried About Charity fundraiser in the Last Year: Not on file  . Ran Out of Food in the Last Year: Not on file  Transportation Needs:   . Lack of Transportation (Medical): Not on file  . Lack of Transportation (Non-Medical): Not on file  Physical Activity:   . Days of Exercise per Week: Not on file  .  Minutes of Exercise per Session: Not on file  Stress:   . Feeling of Stress : Not on file  Social Connections:   . Frequency of Communication with Friends and Family: Not on file  . Frequency of Social Gatherings with Friends and Family: Not on file  . Attends Religious Services: Not on file  . Active Member of Clubs or Organizations: Not on file  . Attends Archivist Meetings: Not on file  . Marital Status: Not on  file  Intimate Partner Violence:   . Fear of Current or Ex-Partner: Not on file  . Emotionally Abused: Not on file  . Physically Abused: Not on file  . Sexually Abused: Not on file     Review of Systems  All other systems reviewed and are negative.      Objective:   Physical Exam Vitals reviewed.  Constitutional:      General: She is not in acute distress.    Appearance: She is well-developed. She is not diaphoretic.  HENT:     Head: Normocephalic and atraumatic.     Right Ear: External ear normal.     Left Ear: External ear normal.     Mouth/Throat:     Pharynx: No oropharyngeal exudate.  Eyes:     Conjunctiva/sclera: Conjunctivae normal.     Pupils: Pupils are equal, round, and reactive to light.  Neck:     Thyroid: No thyromegaly.     Vascular: No JVD.  Cardiovascular:     Rate and Rhythm: Normal rate and regular rhythm.     Heart sounds: Normal heart sounds. No murmur heard.  No friction rub. No gallop.   Pulmonary:     Effort: Pulmonary effort is normal. No respiratory distress.     Breath sounds: Normal breath sounds. No stridor. No wheezing or rales.  Abdominal:     General: Bowel sounds are normal. There is no distension.     Palpations: Abdomen is soft.     Tenderness: There is no abdominal tenderness. There is no guarding.  Musculoskeletal:     Cervical back: Normal range of motion.     Lumbar back: Spasms, tenderness and bony tenderness present. Decreased range of motion. Negative right straight leg raise test and negative left  straight leg raise test.       Back:     Right hip: Tenderness and bony tenderness present. Decreased range of motion. Normal strength.     Left hip: Tenderness and bony tenderness present. Decreased range of motion. Normal strength.       Legs:  Lymphadenopathy:     Cervical: No cervical adenopathy.  Neurological:     Mental Status: She is alert and oriented to person, place, and time.     Cranial Nerves: No cranial nerve deficit.     Sensory: No sensory deficit.     Motor: Tremor present. No atrophy, abnormal muscle tone or seizure activity.     Deep Tendon Reflexes: Reflexes are normal and symmetric.           Assessment & Plan:  Right sided sciatica - Plan: DG Lumbar Spine Complete, DG HIPS BILAT WITH PELVIS 3-4 VIEWS  Bilateral hip pain - Plan: DG Lumbar Spine Complete, DG HIPS BILAT WITH PELVIS 3-4 VIEWS  Greater trochanteric bursitis, right - Plan: DG HIPS BILAT WITH PELVIS 3-4 VIEWS  I believe the patient likely has a combination of problems.  She certainly appears to have greater trochanteric bursitis in the right hip.  However she is also having symptoms consistent with right-sided sciatica.  Therefore I will obtain an x-ray of both the right hip as well as the lumbar spine to evaluate further.  If the x-ray shows no fracture in this area, I would recommend a trial of prednisone in an effort to treat both right-sided sciatica and bursitis.  The pain in the left hip is more concerning.  I recommended an x-ray of the pelvis as well as the hip.  I am  concerned about possible fracture in the ramus of the pubic symphysis on the left-hand side.  Await the results of the x-ray

## 2020-05-17 ENCOUNTER — Other Ambulatory Visit: Payer: Self-pay | Admitting: Family Medicine

## 2020-05-17 MED ORDER — PREDNISONE 20 MG PO TABS: ORAL_TABLET | ORAL | 0 refills | Status: DC

## 2020-05-26 ENCOUNTER — Ambulatory Visit (INDEPENDENT_AMBULATORY_CARE_PROVIDER_SITE_OTHER): Payer: Medicare Other | Admitting: Family Medicine

## 2020-05-26 ENCOUNTER — Other Ambulatory Visit: Payer: Self-pay

## 2020-05-26 VITALS — BP 130/70 | HR 54 | Temp 97.7°F | Ht 59.0 in | Wt 118.0 lb

## 2020-05-26 DIAGNOSIS — I2510 Atherosclerotic heart disease of native coronary artery without angina pectoris: Secondary | ICD-10-CM

## 2020-05-26 DIAGNOSIS — M25552 Pain in left hip: Secondary | ICD-10-CM | POA: Diagnosis not present

## 2020-05-26 MED ORDER — HYDROCODONE-ACETAMINOPHEN 5-325 MG PO TABS
1.0000 | ORAL_TABLET | Freq: Four times a day (QID) | ORAL | 0 refills | Status: DC | PRN
Start: 2020-05-26 — End: 2020-08-01

## 2020-05-26 NOTE — Progress Notes (Signed)
Subjective:    Patient ID: Brittany Kirk, female    DOB: [birth year], 74 y.o.   MRN: [id number]  Fall    Patient is currently on Eliquis for atrial fibrillation.  She recently fell and landed on her right lateral hip.  Ever since that time she has had pain radiating from the posterior aspect of her hip down to the lateral side of her hip over the greater trochanteric bursa which is tender to palpation.  However occasionally the pain will shoot down the posterior lateral aspect of her right leg down below the knee into her foot.  She is also having terrible pain in her left hip.  The pain in the left hip however is anterior.  It is not over the hip joint itself but is more closely linked near the pubic symphysis.  She does not have pain with flexion of the left hip or external or internal rotation however she does have pain with weightbearing in that area and with palpation.  She denies any saddle anesthesia or bowel or bladder incontinence or leg weakness however she does have pain with ambulation.  At that time, my plan was: I believe the patient likely has a combination of problems.  She certainly appears to have greater trochanteric bursitis in the right hip.  However she is also having symptoms consistent with right-sided sciatica.  Therefore I will obtain an x-ray of both the right hip as well as the lumbar spine to evaluate further.  If the x-ray shows no fracture in this area, I would recommend a trial of prednisone in an effort to treat both right-sided sciatica and bursitis.  The pain in the left hip is more concerning.  I recommended an x-ray of the pelvis as well as the hip.  I am concerned about possible fracture in the ramus of the pubic symphysis on the left-hand side.  Await the results of the x-ray  05/26/20 X-rays of the lumbar spine, pelvis, and both hips revealed no fracture.  After taking the prednisone, the pain on the right side of the hip and down the right leg has all but gone  away.  She states that she has no pain on the right side today.  However the pain in her left pelvic area is excruciating.  I am able to reproduce the pain by palpation adjacent to the labia majora on the left side.  The area of maximum tenderness is the tendinous insertion of the groin muscle on the pubic symphysis.  Palpation of this muscle body and muscle tendon elicits the pain.  Therefore I believe that the patient has torn this muscle tendon.  The bone itself does not seem to produce the pain.  I did have the patient disrobe except for her underwear.  There is no erythema in the area of tenderness.  There is no palpable mass.  There is no swelling.  There is no visible abnormality to the skin to suggest some type of infection.  There is no lymphadenopathy in the inguinal canal.  Patient denies any dysuria or hematuria or urgency or frequency or melena or hematochezia.  Based on the muscles of the anterior thigh, I believe that the patient likely has torn either the adductor longus or perhaps the gracilis tendon. Past Medical History:  Diagnosis Date  . Acid reflux   . Allergic rhinitis   . Anxiety   . Asymptomatic bilateral carotid artery stenosis    40-59% (02/2018)  . Atrial fibrillation ([place])  07/2019  . Breast cancer ([place]) 2015   Right Breast Cancer  . CAD (coronary artery disease)   . Depression   . Emphysema    no longer uses inhalers  . Gallstones    nausea, pain upper right abdomen  . GERD (gastroesophageal reflux disease)   . H/O hiatal hernia   . Hiatal hernia   . History of skin cancer   . Hyperlipidemia   . Hypertension   . Multinodular goiter (nontoxic)   . Scoliosis   . Skin cancer   . Tobacco user   . Wears dentures    top   Past Surgical History:  Procedure Laterality Date  . BREAST LUMPECTOMY Right 2015  . CHOLECYSTECTOMY  07/08/2012   Procedure: LAPAROSCOPIC CHOLECYSTECTOMY WITH INTRAOPERATIVE CHOLANGIOGRAM;  Surgeon: Gayland Curry, MD,FACS;  Location: [facility];   Service: General;  Laterality: N/A;  Laparoscopic Cholecystectomy with Intraoperative Cholangiogram  . COLONOSCOPY    . ESOPHAGOSCOPY N/A 05/29/2017   Procedure: ESOPHAGOSCOPY;  Surgeon: Clarene Essex, MD;  Location: [facility];  Service: Endoscopy;  Laterality: N/A;  botox injection  . HAND SURGERY Left    wrist  . LEFT HEART CATH AND CORONARY ANGIOGRAPHY N/A 10/11/2017   Procedure: LEFT HEART CATH AND CORONARY ANGIOGRAPHY;  Surgeon: Martinique, Peter M, MD;  Location: [facility];  Service: Cardiovascular;  Laterality: N/A;  . TONSILECTOMY, ADENOIDECTOMY, BILATERAL MYRINGOTOMY AND TUBES    . TUBAL LIGATION     Current Outpatient Medications on File Prior to Visit  Medication Sig Dispense Refill  . ascorbic acid (VITAMIN C) 500 MG tablet Take 500 mg by mouth daily.    Marland Kitchen atorvastatin (LIPITOR) 40 MG tablet TAKE 1 TABLET BY MOUTH EVERYDAY AT BEDTIME 90 tablet 2  . cholecalciferol (VITAMIN D3) 25 MCG (1000 UNIT) tablet Take 1,000 Units by mouth daily.    . Cyanocobalamin (B-12) 2500 MCG TABS Take 2,500 mcg by mouth daily.    . diazepam (VALIUM) 5 MG tablet Take 5 mg by mouth daily as needed for anxiety.   3  . ELIQUIS 5 MG TABS tablet TAKE 1 TABLET BY MOUTH TWICE A DAY 180 tablet 1  . metoprolol succinate (TOPROL XL) 25 MG 24 hr tablet Take 0.5 tablets (12.5 mg total) by mouth daily. 45 tablet 3  . omeprazole (PRILOSEC) 20 MG capsule Take 20 mg by mouth daily.    . traZODone (DESYREL) 50 MG tablet TAKE 1/2 TO 1 TABLET BY MOUTH AT BEDTIME AS NEEDED FOR SLEEP 90 tablet 3  . zinc gluconate 50 MG tablet Take 50 mg by mouth daily.    . ciprofloxacin (CIPRO) 250 MG tablet Take 1 tablet (250 mg total) by mouth 2 (two) times daily. (Patient not taking: No sig reported) 10 tablet 0   No current facility-administered medications on file prior to visit.   Allergies  Allergen Reactions  . Penicillins Anaphylaxis, Itching, Swelling and Rash    Has patient had a PCN reaction causing immediate rash,  facial/tongue/throat swelling, SOB or lightheadedness with hypotension: Yes Has patient had a PCN reaction causing severe rash involving mucus membranes or skin necrosis: No Has patient had a PCN reaction that required hospitalization: Yes Has patient had a PCN reaction occurring within the last 10 years: No If all of the above answers are "NO", then may proceed with Cephalosporin use.   . Cefaclor Itching, Swelling and Rash  . Latex Itching and Rash  . Metronidazole Itching, Swelling and Rash  . Naproxen Itching, Swelling  and Rash  . Nsaids Itching, Swelling and Rash    Ibuprofen IS tolerated  . Septra [Bactrim] Itching, Swelling and Rash  . Sulfonamide Derivatives Itching, Swelling and Rash  . Tape Itching and Rash   Social History   Socioeconomic History  . Marital status: Married    Spouse name: Not on file  . Number of children: 1  . Years of education: Not on file  . Highest education level: Not on file  Occupational History  . Occupation: retired Clinical cytogeneticist: RETIRED  Tobacco Use  . Smoking status: Former Smoker    Packs/day: 0.50    Years: 46.00    Pack years: 23.00    Types: Cigarettes    Quit date: 02/13/2011    Years since quitting: 9.2  . Smokeless tobacco: Never Used  Vaping Use  . Vaping Use: Never used  Substance and Sexual Activity  . Alcohol use: No  . Drug use: No  . Sexual activity: Not on file  Other Topics Concern  . Not on file  Social History Narrative  . Not on file   Social Determinants of Health   Financial Resource Strain: Low Risk   . Difficulty of Paying Living Expenses: Not hard at all  Food Insecurity: Not on file  Transportation Needs: Not on file  Physical Activity: Not on file  Stress: Not on file  Social Connections: Not on file  Intimate Partner Violence: Not on file     Review of Systems  All other systems reviewed and are negative.      Objective:   Physical Exam Vitals reviewed.   Constitutional:      General: She is not in acute distress.    Appearance: She is well-developed. She is not diaphoretic.  HENT:     Head: Normocephalic and atraumatic.     Right Ear: External ear normal.     Left Ear: External ear normal.     Mouth/Throat:     Pharynx: No oropharyngeal exudate.  Eyes:     Conjunctiva/sclera: Conjunctivae normal.     Pupils: Pupils are equal, round, and reactive to light.  Neck:     Thyroid: No thyromegaly.     Vascular: No JVD.  Cardiovascular:     Rate and Rhythm: Normal rate and regular rhythm.     Heart sounds: Normal heart sounds. No murmur heard. No friction rub. No gallop.   Pulmonary:     Effort: Pulmonary effort is normal. No respiratory distress.     Breath sounds: Normal breath sounds. No stridor. No wheezing or rales.  Abdominal:     General: Bowel sounds are normal. There is no distension.     Palpations: Abdomen is soft.     Tenderness: There is no abdominal tenderness. There is no guarding.  Musculoskeletal:     Cervical back: Normal range of motion.     Lumbar back: Spasms, tenderness and bony tenderness present. Decreased range of motion. Negative right straight leg raise test and negative left straight leg raise test.     Right hip: No tenderness or bony tenderness. Normal range of motion. Normal strength.     Left hip: Tenderness and bony tenderness present. Normal range of motion. Normal strength.       Legs:  Lymphadenopathy:     Cervical: No cervical adenopathy.  Neurological:     Mental Status: She is alert and oriented to person, place, and time.     Cranial Nerves:  No cranial nerve deficit.     Sensory: No sensory deficit.     Motor: Tremor present. No atrophy, abnormal muscle tone or seizure activity.     Deep Tendon Reflexes: Reflexes are normal and symmetric.           Assessment & Plan:  Left hip pain  Patient is having severe pain in the left hip despite a negative x-ray.  I believe that she would  benefit from an MRI of the hip however she has not failed 6 weeks of conservative therapy.  However the patient is in such pain, she does not feel that she could tolerate physical therapy right now.  Therefore I will consult orthopedics to get a second opinion to see if they feel she needs an MRI of the hip and pelvic area prior to proceeding with physical therapy.  I will consult orthopedics soon as possible.  I gave the patient hydrocodone 5/325 1 every 6 hours as needed for pain.

## 2020-06-01 ENCOUNTER — Telehealth: Payer: Self-pay

## 2020-06-01 ENCOUNTER — Ambulatory Visit (INDEPENDENT_AMBULATORY_CARE_PROVIDER_SITE_OTHER): Payer: Medicare Other | Admitting: Family Medicine

## 2020-06-01 ENCOUNTER — Other Ambulatory Visit: Payer: Self-pay

## 2020-06-01 DIAGNOSIS — M25552 Pain in left hip: Secondary | ICD-10-CM

## 2020-06-01 MED ORDER — DICLOFENAC SODIUM 1 % EX GEL: 4.0000 g | Freq: Four times a day (QID) | CUTANEOUS | 6 refills | Status: DC | PRN

## 2020-06-01 NOTE — Telephone Encounter (Signed)
Please advise 

## 2020-06-01 NOTE — Telephone Encounter (Signed)
Patient called about the rx that was called in today for Voltaren she stated she is reading the side effects and she has health issues she doesn't think the medication is safe she would like a call back regarding what's next CB:[phone]

## 2020-06-01 NOTE — Progress Notes (Signed)
Office Visit Note   Patient: Brittany Kirk           Date of Birth: 24-Aug-1945           MRN: [id number] Visit Date: 06/01/2020 Requested by: Susy Frizzle, MD [address] [place],  [place] [address] PCP: Susy Frizzle, MD  Subjective: Chief Complaint  Patient presents with  . Left Hip - Pain    Pain in the left groin. Fell on Thanksgiving - affected right hip more than left. The right hip got better, but the left groin pain has worsened. Hurts to lift the left leg. The pain is a "burning, stinging sensation."    HPI: She is here with left hip pain.  Right after Thanksgiving she fell injuring her right hip.  She was able to get up and walk, and after a few days she went to see her PCP who obtained x-rays which were negative for fracture.  She continued to have pain and was placed on prednisone which helped significantly with her right hip.  Shortly after her right hip started hurting, the left hip began to hurt in the groin area.  That hip has continued to bother her significantly when walking or trying to move her leg.  At rest she has minimal discomfort.  No previous problems with her hip.  She is now using a walker for support because of her pain.  She is on anticoagulants chronically.                ROS:   All other systems were reviewed and are negative.  Objective: Vital Signs: There were no vitals taken for this visit.  Physical Exam:  General:  Alert and oriented, in no acute distress. Pulm:  Breathing unlabored. Psy:  Normal mood, congruent affect. Skin: No bruising Left hip: She has pain with flexion and adduction against resistance.  She has pain with passive flexion and internal rotation but she still has good range of motion.  No significant tenderness over the greater trochanter.   Imaging: No results found.  Assessment & Plan: 1.  Persistent left hip pain status post fall, cannot rule out occult fracture.  Other possibilities include  hemarthrosis, tendon strain. -We discussed options and elected to pursue MRI scan to rule out fracture.  If negative, then possibly intra-articular steroid injection under ultrasound guidance. -Trial of Voltaren gel topically for pain relief. -Physical therapy would be another option depending on MRI findings.     Procedures: No procedures performed        PMFS History: Patient Active Problem List   Diagnosis Date Noted  . Atrial fibrillation with RVR ([place]) 07/15/2019  . Multinodular goiter (nontoxic)   . Asymptomatic bilateral carotid artery stenosis   . History of depression 02/14/2018  . History of breast cancer 02/14/2018  . Accelerated hypertension 02/14/2018  . Unstable angina ([place]) 10/11/2017  . Caloric malnutrition ([place]) 08/16/2017  . Weight loss, unintentional 03/12/2017  . GERD (gastroesophageal reflux disease) 08/10/2016  . Cough 08/10/2016  . Acute bronchitis 07/12/2016  . Bilateral carotid artery disease ([place]) 02/14/2016  . Chest pain 01/13/2016  . Osteoporosis 02/22/2015  . Stage 2 chronic kidney disease due to benign hypertension 08/16/2014  . Malignant neoplasm of upper-outer quadrant of right breast in female, estrogen receptor positive ([place]) 03/22/2014  . Dizziness and giddiness 11/27/2012  . Essential tremor 11/27/2012  . Numbness 11/27/2012  . TOBACCO USER 10/04/2009  . CLOSED FRACTURE OF ONE RIB  07/12/2009  . Dyslipidemia, goal LDL below 70 09/28/2008  . CAD in native artery 09/28/2008  . Rhinitis 09/09/2008  . COPD (chronic obstructive pulmonary disease) ([place]) 09/07/2008  . Essential hypertension 09/06/2008  . SKIN CANCER, HX OF 09/06/2008   Past Medical History:  Diagnosis Date  . Acid reflux   . Allergic rhinitis   . Anxiety   . Asymptomatic bilateral carotid artery stenosis    40-59% (02/2018)  . Atrial fibrillation ([place]) 07/2019  . Breast cancer ([place]) 2015   Right Breast Cancer  . CAD (coronary artery disease)   . Depression   .  Emphysema    no longer uses inhalers  . Gallstones    nausea, pain upper right abdomen  . GERD (gastroesophageal reflux disease)   . H/O hiatal hernia   . Hiatal hernia   . History of skin cancer   . Hyperlipidemia   . Hypertension   . Multinodular goiter (nontoxic)   . Scoliosis   . Skin cancer   . Tobacco user   . Wears dentures    top    Family History  Problem Relation Age of Onset  . Arthritis Mother   . Pancreatic cancer Mother   . Heart disease Other        5 brothers and 2 sister  . Diverticulitis Sister   . Heart disease Sister   . Aplastic anemia Other     Past Surgical History:  Procedure Laterality Date  . BREAST LUMPECTOMY Right 2015  . CHOLECYSTECTOMY  07/08/2012   Procedure: LAPAROSCOPIC CHOLECYSTECTOMY WITH INTRAOPERATIVE CHOLANGIOGRAM;  Surgeon: Gayland Curry, MD,FACS;  Location: [facility];  Service: General;  Laterality: N/A;  Laparoscopic Cholecystectomy with Intraoperative Cholangiogram  . COLONOSCOPY    . ESOPHAGOSCOPY N/A 05/29/2017   Procedure: ESOPHAGOSCOPY;  Surgeon: Clarene Essex, MD;  Location: [facility];  Service: Endoscopy;  Laterality: N/A;  botox injection  . HAND SURGERY Left    wrist  . LEFT HEART CATH AND CORONARY ANGIOGRAPHY N/A 10/11/2017   Procedure: LEFT HEART CATH AND CORONARY ANGIOGRAPHY;  Surgeon: Martinique, Peter M, MD;  Location: [facility];  Service: Cardiovascular;  Laterality: N/A;  . TONSILECTOMY, ADENOIDECTOMY, BILATERAL MYRINGOTOMY AND TUBES    . TUBAL LIGATION     Social History   Occupational History  . Occupation: retired Clinical cytogeneticist: RETIRED  Tobacco Use  . Smoking status: Former Smoker    Packs/day: 0.50    Years: 46.00    Pack years: 23.00    Types: Cigarettes    Quit date: 02/13/2011    Years since quitting: 9.3  . Smokeless tobacco: Never Used  Vaping Use  . Vaping Use: Never used  Substance and Sexual Activity  . Alcohol use: No  . Drug use: No  . Sexual activity: Not on file

## 2020-06-02 MED ORDER — TRAMADOL HCL 50 MG PO TABS: 50.0000 mg | ORAL_TABLET | Freq: Four times a day (QID) | ORAL | 0 refills | Status: DC | PRN

## 2020-06-02 NOTE — Telephone Encounter (Signed)
Tramadol Rx sent 

## 2020-06-02 NOTE — Telephone Encounter (Signed)
I called and spoke with the patient's husband. The patient had already seen a message from the pharmacy that this medication was prescribed. I asked if she felt more at ease with this medication and the husband gave an affirmative response.

## 2020-06-02 NOTE — Addendum Note (Signed)
Addended by: Hortencia Pilar on: 06/02/2020 08:16 AM   Modules accepted: Orders

## 2020-06-06 ENCOUNTER — Telehealth: Payer: Self-pay | Admitting: Family Medicine

## 2020-06-09 ENCOUNTER — Other Ambulatory Visit: Payer: Self-pay | Admitting: Family Medicine

## 2020-06-09 ENCOUNTER — Telehealth (INDEPENDENT_AMBULATORY_CARE_PROVIDER_SITE_OTHER): Payer: Medicare Other | Admitting: Nurse Practitioner

## 2020-06-09 ENCOUNTER — Other Ambulatory Visit: Payer: Self-pay

## 2020-06-09 DIAGNOSIS — J069 Acute upper respiratory infection, unspecified: Secondary | ICD-10-CM | POA: Diagnosis not present

## 2020-06-09 MED ORDER — GUAIFENESIN ER 600 MG PO TB12: 600.0000 mg | ORAL_TABLET | Freq: Two times a day (BID) | ORAL | 0 refills | Status: DC | PRN

## 2020-06-09 NOTE — Assessment & Plan Note (Signed)
Acute, ongoing x 3 days.  Encouraged COVID testing.  Reassured patient that symptoms and exam findings are most consistent with a viral upper respiratory infection and explained lack of efficacy of antibiotics against viruses.  Discussed expected course and features suggestive of secondary bacterial infection.  Continue supportive care. Increase fluid intake with water or electrolyte solution like pedialyte. Encouraged acetaminophen as needed for fever/pain. Encouraged salt water gargling, chloraseptic spray and throat lozenges. Encouraged OTC guaifenesin. Encouraged saline sinus flushes and/or neti with humidified air.  Notify clinic if symptoms last greater than 10 days.  With any chest pain or shortness of breath, go to ER.

## 2020-06-09 NOTE — Patient Instructions (Signed)

## 2020-06-09 NOTE — Progress Notes (Signed)
Subjective:    Patient ID: Brittany Kirk, female    DOB: 06/05/1946, 74 y.o.   MRN: [id number]  HPI: Brittany Kirk is a 74 y.o. female presenting virtually for upper respiratory symptoms.  Chief Complaint  Patient presents with  . Illness    For the past 3 days having runny nose, productive coughing and temp of 99.4. taking coricidin for the sx, seem to be helping a bit with the cough. Pt is has had covid vaccine, not time for booster. Medication and pharmacy has been verified   UPPER RESPIRATORY TRACT INFECTION Onset: 3 days ago Worst symptom: congestion and phlegm Fever: low grade fever; 99.4 Cough: yes; thick, yellow sputum Shortness of breath: no Wheezing: no Chest pain: no Chest tightness: no Chest congestion: yes Nasal congestion: no Runny nose: yes Post nasal drip: yes Sneezing: no Sore throat: no Swollen glands: no Sinus pressure: no Headache: no Face pain: no Toothache: no Ear pain: no  Ear pressure: no  Dysphagia: no Eyes red/itching:no Eye drainage/crusting: no  Nausea: no Vomiting: no Diarrhea: no Change in appetite: no Loss of taste/smell: no Rash: no Fatigue: yes Sick contacts: no Strep contacts: no  Context: better Recurrent sinusitis: no Treatments attempted: Coricidin, zyrtec Relief with OTC medications: yes  Allergies  Allergen Reactions  . Penicillins Anaphylaxis, Itching, Swelling and Rash    Has patient had a PCN reaction causing immediate rash, facial/tongue/throat swelling, SOB or lightheadedness with hypotension: Yes Has patient had a PCN reaction causing severe rash involving mucus membranes or skin necrosis: No Has patient had a PCN reaction that required hospitalization: Yes Has patient had a PCN reaction occurring within the last 10 years: No If all of the above answers are "NO", then may proceed with Cephalosporin use.   . Cefaclor Itching, Swelling and Rash  . Latex Itching and Rash  . Metronidazole Itching, Swelling  and Rash  . Naproxen Itching, Swelling and Rash  . Nsaids Itching, Swelling and Rash    Ibuprofen IS tolerated  . Septra [Bactrim] Itching, Swelling and Rash  . Sulfonamide Derivatives Itching, Swelling and Rash  . Tape Itching and Rash    Outpatient Encounter Medications as of 06/09/2020  Medication Sig  . ascorbic acid (VITAMIN C) 500 MG tablet Take 500 mg by mouth daily.  Marland Kitchen atorvastatin (LIPITOR) 40 MG tablet TAKE 1 TABLET BY MOUTH EVERYDAY AT BEDTIME  . cholecalciferol (VITAMIN D3) 25 MCG (1000 UNIT) tablet Take 1,000 Units by mouth daily.  . Cyanocobalamin (B-12) 2500 MCG TABS Take 2,500 mcg by mouth daily.  . diazepam (VALIUM) 5 MG tablet Take 5 mg by mouth daily as needed for anxiety.   . diclofenac Sodium (VOLTAREN) 1 % GEL Apply 4 g topically 4 (four) times daily as needed.  Marland Kitchen ELIQUIS 5 MG TABS tablet TAKE 1 TABLET BY MOUTH TWICE A DAY  . guaiFENesin (MUCINEX) 600 MG 12 hr tablet Take 1 tablet (600 mg total) by mouth 2 (two) times daily as needed for cough or to loosen phlegm.  Marland Kitchen HYDROcodone-acetaminophen (NORCO) 5-325 MG tablet Take 1 tablet by mouth every 6 (six) hours as needed for moderate pain.  . metoprolol succinate (TOPROL XL) 25 MG 24 hr tablet Take 0.5 tablets (12.5 mg total) by mouth daily.  Marland Kitchen omeprazole (PRILOSEC) 20 MG capsule Take 20 mg by mouth daily.  . traMADol (ULTRAM) 50 MG tablet Take 1 tablet (50 mg total) by mouth every 6 (six) hours as needed.  . traZODone (DESYREL) 50  MG tablet TAKE 1/2 TO 1 TABLET BY MOUTH AT BEDTIME AS NEEDED FOR SLEEP  . zinc gluconate 50 MG tablet Take 50 mg by mouth daily.   No facility-administered encounter medications on file as of 06/09/2020.    Patient Active Problem List   Diagnosis Date Noted  . Upper respiratory tract infection 06/09/2020  . Atrial fibrillation with RVR ([place]) 07/15/2019  . Multinodular goiter (nontoxic)   . Asymptomatic bilateral carotid artery stenosis   . History of depression 02/14/2018  . History  of breast cancer 02/14/2018  . Accelerated hypertension 02/14/2018  . Unstable angina ([place]) 10/11/2017  . Caloric malnutrition ([place]) 08/16/2017  . Weight loss, unintentional 03/12/2017  . GERD (gastroesophageal reflux disease) 08/10/2016  . Cough 08/10/2016  . Acute bronchitis 07/12/2016  . Bilateral carotid artery disease ([place]) 02/14/2016  . Chest pain 01/13/2016  . Osteoporosis 02/22/2015  . Stage 2 chronic kidney disease due to benign hypertension 08/16/2014  . Malignant neoplasm of upper-outer quadrant of right breast in female, estrogen receptor positive ([place]) 03/22/2014  . Dizziness and giddiness 11/27/2012  . Essential tremor 11/27/2012  . Numbness 11/27/2012  . TOBACCO USER 10/04/2009  . CLOSED FRACTURE OF ONE RIB 07/12/2009  . Dyslipidemia, goal LDL below 70 09/28/2008  . CAD in native artery 09/28/2008  . Rhinitis 09/09/2008  . COPD (chronic obstructive pulmonary disease) ([place]) 09/07/2008  . Essential hypertension 09/06/2008  . SKIN CANCER, HX OF 09/06/2008    Past Medical History:  Diagnosis Date  . Acid reflux   . Allergic rhinitis   . Anxiety   . Asymptomatic bilateral carotid artery stenosis    40-59% (02/2018)  . Atrial fibrillation ([place]) 07/2019  . Breast cancer ([place]) 2015   Right Breast Cancer  . CAD (coronary artery disease)   . Depression   . Emphysema    no longer uses inhalers  . Gallstones    nausea, pain upper right abdomen  . GERD (gastroesophageal reflux disease)   . H/O hiatal hernia   . Hiatal hernia   . History of skin cancer   . Hyperlipidemia   . Hypertension   . Multinodular goiter (nontoxic)   . Scoliosis   . Skin cancer   . Tobacco user   . Wears dentures    top    Relevant past medical, surgical, family and social history reviewed and updated as indicated. Interim medical history since our last visit reviewed.  Review of Systems  Constitutional: Positive for fatigue. Negative for activity change, appetite change and fever.   HENT: Positive for congestion, postnasal drip and rhinorrhea. Negative for ear discharge, ear pain, sinus pressure, sinus pain, sneezing, sore throat and trouble swallowing.   Eyes: Negative.  Negative for pain, discharge, redness and itching.  Respiratory: Positive for cough. Negative for chest tightness, shortness of breath and wheezing.   Cardiovascular: Negative.  Negative for chest pain.  Gastrointestinal: Negative.  Negative for diarrhea, nausea and vomiting.  Skin: Negative.  Negative for rash.  Neurological: Negative.  Negative for headaches.  Hematological: Negative.  Negative for adenopathy.  Psychiatric/Behavioral: Negative.     Per HPI unless specifically indicated above     Objective:    There were no vitals taken for this visit.  Wt Readings from Last 3 Encounters:  05/26/20 118 lb (53.5 kg)  05/16/20 116 lb (52.6 kg)  04/05/20 116 lb 6.4 oz (52.8 kg)    Physical Exam Physical examination unable to be performed due to lack of equipment.  Patient talking  in complete sentences during telemedicine visit.     Assessment & Plan:   Problem List Items Addressed This Visit      Respiratory   Upper respiratory tract infection - Primary    Acute, ongoing x 3 days.  Encouraged COVID testing.  Reassured patient that symptoms and exam findings are most consistent with a viral upper respiratory infection and explained lack of efficacy of antibiotics against viruses.  Discussed expected course and features suggestive of secondary bacterial infection.  Continue supportive care. Increase fluid intake with water or electrolyte solution like pedialyte. Encouraged acetaminophen as needed for fever/pain. Encouraged salt water gargling, chloraseptic spray and throat lozenges. Encouraged OTC guaifenesin. Encouraged saline sinus flushes and/or neti with humidified air.  Notify clinic if symptoms last greater than 10 days.  With any chest pain or shortness of breath, go to ER.        Relevant Medications   guaiFENesin (MUCINEX) 600 MG 12 hr tablet       Follow up plan: Return if symptoms worsen or fail to improve.  This visit was completed via telephone due to the restrictions of the COVID-19 pandemic. All issues as above were discussed and addressed but no physical exam was performed. If it was felt that the patient should be evaluated in the office, they were directed there. The patient verbally consented to this visit. Patient was unable to complete an audio/visual visit due to Lack of equipment. . Location of the patient: home . Location of the provider: work . Those involved with this call:  . Provider: Carnella Guadalajara, DNP . CMA: Annabelle Harman, CMA . Front Desk/Registration: Jeralene Peters  . Time spent on call: 14 minutes on the phone discussing health concerns. 26 minutes total spent in review of patient's record and preparation of their chart.  I verified patient identity using two factors (patient name and date of birth). Patient consents verbally to being seen via telemedicine visit today.

## 2020-06-11 LAB — SARS-COV-2 RNA,(COVID-19) QUALITATIVE NAAT: SARS CoV2 RNA: NOT DETECTED

## 2020-06-13 ENCOUNTER — Other Ambulatory Visit: Payer: Self-pay

## 2020-06-13 ENCOUNTER — Ambulatory Visit (HOSPITAL_COMMUNITY)
Admission: RE | Admit: 2020-06-13 | Discharge: 2020-06-13 | Disposition: A | Discharge status: Home / Self Care | Payer: Medicare Other | Source: Ambulatory Visit | Attending: Family Medicine | Admitting: Family Medicine

## 2020-06-13 DIAGNOSIS — S72145A Nondisplaced intertrochanteric fracture of left femur, initial encounter for closed fracture: Secondary | ICD-10-CM | POA: Diagnosis not present

## 2020-06-13 DIAGNOSIS — S32402A Unspecified fracture of left acetabulum, initial encounter for closed fracture: Secondary | ICD-10-CM | POA: Diagnosis not present

## 2020-06-13 DIAGNOSIS — S32592A Other specified fracture of left pubis, initial encounter for closed fracture: Secondary | ICD-10-CM | POA: Diagnosis not present

## 2020-06-13 DIAGNOSIS — M25552 Pain in left hip: Secondary | ICD-10-CM | POA: Insufficient documentation

## 2020-06-13 DIAGNOSIS — S73192A Other sprain of left hip, initial encounter: Secondary | ICD-10-CM | POA: Diagnosis not present

## 2020-06-14 ENCOUNTER — Telehealth: Payer: Self-pay | Admitting: Family Medicine

## 2020-06-14 ENCOUNTER — Other Ambulatory Visit: Payer: Self-pay | Admitting: Family Medicine

## 2020-06-14 ENCOUNTER — Encounter: Payer: Self-pay | Admitting: Family Medicine

## 2020-06-14 DIAGNOSIS — M25552 Pain in left hip: Secondary | ICD-10-CM

## 2020-06-14 NOTE — Telephone Encounter (Signed)
MRI shows nondisplaced fractures of the pelvis and hip.    I will ask one of our surgeons to look at the images as well, but I do not think this will require any surgical intervention.

## 2020-06-14 NOTE — Telephone Encounter (Signed)
Perfect, thanks 

## 2020-06-14 NOTE — Telephone Encounter (Signed)
Looks all good to me.  Nonop.  Weight-bear as tolerated and physical therapy.  Thank you.

## 2020-06-15 ENCOUNTER — Encounter: Payer: Self-pay | Admitting: Family Medicine

## 2020-06-21 ENCOUNTER — Ambulatory Visit: Payer: Medicare Other | Admitting: General Practice

## 2020-06-27 ENCOUNTER — Ambulatory Visit: Payer: Medicare Other | Admitting: Physical Therapy

## 2020-07-11 ENCOUNTER — Other Ambulatory Visit: Payer: Self-pay

## 2020-07-11 ENCOUNTER — Ambulatory Visit (INDEPENDENT_AMBULATORY_CARE_PROVIDER_SITE_OTHER): Payer: Medicare Other | Admitting: Physical Therapy

## 2020-07-11 DIAGNOSIS — R262 Difficulty in walking, not elsewhere classified: Secondary | ICD-10-CM

## 2020-07-11 DIAGNOSIS — M6281 Muscle weakness (generalized): Secondary | ICD-10-CM

## 2020-07-11 DIAGNOSIS — M25552 Pain in left hip: Secondary | ICD-10-CM

## 2020-07-11 NOTE — Patient Instructions (Signed)
Access Code: [email or web address] URL: [email or web address] Date: 07/11/2020 Prepared by: Kearney Hard  Exercises Supine Bridge - 1 x daily - 7 x weekly - 2 sets - 10 reps - 2-3 seconds hold Supine Hip Adduction Isometric with Ball - 2 x daily - 7 x weekly - 2 sets - 10 reps - 5 seconds hold Hooklying Clamshell with Resistance - 2 x daily - 7 x weekly - 2 sets - 10 reps Supine Lower Trunk Rotation - 2 x daily - 7 x weekly - 3 reps - 20 seconds hold

## 2020-07-11 NOTE — Therapy (Signed)
[facility] [address] [place], [place], [address] Phone: [phone]   Fax:  [phone]  Physical Therapy Treatment  Patient Details  Name: Brittany Kirk MRN: [id number] Date of Birth: 06-18-1945 Referring Provider (PT): Eunice Blase, MD   Encounter Date: 07/11/2020   PT End of Session - 07/11/20 1242    Visit Number 1    Number of Visits 12    Date for PT Re-Evaluation 09/09/20    Authorization Type medicare    Progress Note Due on Visit 10    PT Start Time 1104    PT Stop Time 1143    PT Time Calculation (min) 39 min    Activity Tolerance Patient tolerated treatment well    Behavior During Therapy [facility] for tasks assessed/performed           Past Medical History:  Diagnosis Date  . Acid reflux   . Allergic rhinitis   . Anxiety   . Asymptomatic bilateral carotid artery stenosis    40-59% (02/2018)  . Atrial fibrillation ([place]) 07/2019  . Breast cancer ([place]) 2015   Right Breast Cancer  . CAD (coronary artery disease)   . Depression   . Emphysema    no longer uses inhalers  . Gallstones    nausea, pain upper right abdomen  . GERD (gastroesophageal reflux disease)   . H/O hiatal hernia   . Hiatal hernia   . History of skin cancer   . Hyperlipidemia   . Hypertension   . Multinodular goiter (nontoxic)   . Scoliosis   . Skin cancer   . Tobacco user   . Wears dentures    top    Past Surgical History:  Procedure Laterality Date  . BREAST LUMPECTOMY Right 2015  . CHOLECYSTECTOMY  07/08/2012   Procedure: LAPAROSCOPIC CHOLECYSTECTOMY WITH INTRAOPERATIVE CHOLANGIOGRAM;  Surgeon: Gayland Curry, MD,FACS;  Location: [facility];  Service: General;  Laterality: N/A;  Laparoscopic Cholecystectomy with Intraoperative Cholangiogram  . COLONOSCOPY    . ESOPHAGOSCOPY N/A 05/29/2017   Procedure: ESOPHAGOSCOPY;  Surgeon: Clarene Essex, MD;  Location: [place];  Service: Endoscopy;  Laterality: N/A;  botox injection  . HAND SURGERY Left     wrist  . LEFT HEART CATH AND CORONARY ANGIOGRAPHY N/A 10/11/2017   Procedure: LEFT HEART CATH AND CORONARY ANGIOGRAPHY;  Surgeon: Martinique, Peter M, MD;  Location: [facility];  Service: Cardiovascular;  Laterality: N/A;  . TONSILECTOMY, ADENOIDECTOMY, BILATERAL MYRINGOTOMY AND TUBES    . TUBAL LIGATION      There were no vitals filed for this visit.   Subjective Assessment - 07/11/20 1103    Subjective Brittany Kirk arriving to [facility] reporting fall in November and ongoing groin pain on left side. Pt reporting she was lying on her sofa and when she went to get up she stepped on her throw and tripped.              [facility] PT Assessment - 07/11/20 0001      Assessment   Medical Diagnosis L hip pain M25.552    Referring Provider (PT) Hilts, Michael, MD    Hand Dominance Right    Prior Therapy no      Precautions   Precautions None      Restrictions   Weight Bearing Restrictions No      Balance Screen   Has the patient fallen in the past 6 months Yes    How many times? 1    Is the patient reluctant  to leave their home because of a fear of falling?  No      Home Environment   Living Environment Private residence    Living Arrangements Spouse/significant other    Type of [place] to enter    Entrance Stairs-Number of Steps 2    Entrance Stairs-Rails None    Home Layout Able to live on main level with bedroom/bathroom;Two level      Prior Function   Level of Independence Independent    Vocation Retired      Charity fundraiser Status Within Functional Limits for tasks assessed      Observation/Other Assessments   Focus on Therapeutic Outcomes (FOTO)  % (predicted %)      ROM / Strength   AROM / PROM / Strength AROM;Strength      AROM   Overall AROM  Deficits    AROM Assessment Site Hip    Right/Left Hip Right;Left    Right Hip Extension 120    Right Hip External Rotation  50    Right Hip Internal Rotation  40    Left Hip  Flexion 115    Left Hip External Rotation  48   pain noted in L groin   Left Hip Internal Rotation  40   pain noted in L groin     Strength   Overall Strength Deficits    Strength Assessment Site Hip    Right/Left Hip Right;Left    Right Hip Flexion 4/5    Right Hip External Rotation  4/5    Right Hip Internal Rotation 4/5    Left Hip Flexion 3/5    Left Hip External Rotation 3/5    Left Hip Internal Rotation 3/5      Transfers   Five time sit to stand comments  21 seconds no UE support      Ambulation/Gait   Assistive device Rolling walker    Gait Comments Pt amb with RW with step though gait pattern with decreased wieght shifting on L LE and decreased foot clearance.                                 PT Education - 07/11/20 1242    Education Details PT POC, HEP    Person(s) Educated Patient    Methods Explanation;Demonstration;Handout    Comprehension Returned demonstration;Verbalized understanding            PT Short Term Goals - 07/11/20 1227      PT SHORT TERM GOAL #1   Title Pt will be independent in her initial HEP.    Time 3    Period Weeks    Status New    Target Date 08/05/20      PT SHORT TERM GOAL #2   Title Will participate in balance assessments via [place] with LTG to be set.    Time 3    Period Weeks    Status New      PT SHORT TERM GOAL #3   Title -      PT SHORT TERM GOAL #4   Title -             PT Long Term Goals - 07/11/20 1228      PT LONG TERM GOAL #1   Title Pt will be able to demonstrate >/= 4+/5 in bilateral LE"s for improved functional mobility and gait.  Baseline see flowsheets    Time 6    Period Weeks    Status New      PT LONG TERM GOAL #2   Title Pt will be able to amb 500 feet without assistive safely with normalized gait pattern on level surfaces.    Baseline amb with RW on 07/11/20    Time 6    Period Weeks    Status New    Target Date 08/26/20      PT LONG TERM GOAL #3   Title  Pt will improve her FOTO from 52% to >/= 69% function.    Baseline 52% on 07/11/2020    Time 6    Period Weeks    Status New    Target Date 08/26/20      PT LONG TERM GOAL #4   Title Pt will be able to improve her 5 time sit stand </= 14 seconds with no UE support.    Time 6    Period Weeks    Status New    Target Date 08/26/20      PT LONG TERM GOAL #5   Title Pt will be able to report her L hip pain is </= 2/10 with transfers and walking house hold distances.    Baseline see flow sheets    Time 6    Period Weeks    Status New    Target Date 08/26/20                 Plan - 07/11/20 1241    Clinical Impression Statement Pt presenting today for PT evaluation of L hip pain. Pt recent MRI on 06/13/2020 with findings: acute nondisplaced fx L pubic root/anterior acetabulum, nondisplaced fx of L pubic bone inferior ramis, acute bilisacral alar fx, OA of anterisuperior labrus. Pt presenting with tenderness along adductor magnus tendon and medial left thigh. Pt reporting difficulty sleeping and increasd pain after prolonged sitting when she first stands. Pt currently amb with rolling walker with decreased weight shifting and stance phase on L LE. Skilled PT needed to address pt's impairments with below interventions.    Personal Factors and Comorbidities Age;Comorbidity 3+    Comorbidities Pt recent MRI on 06/13/2020 with findings: acute nondisplaced fx L pubic root/anterior acetabulum, nondisplaced fx of L pubic bone inferior ramis, acute bilisacral alar fx, OA of anterisuperior labrus.    Examination-Activity Limitations Squat;Stairs;Transfers;Stand    Examination-Participation Restrictions Cleaning;Community Activity;Driving;Other    Stability/Clinical Decision Making Stable/Uncomplicated    Clinical Decision Making Low    Rehab Potential Good    PT Frequency 2x / week    PT Duration 6 weeks    PT Treatment/Interventions ADLs/Self Care Home Management;Cryotherapy;Electrical  Stimulation;Moist Heat;Ultrasound;Gait training;Stair training;Functional mobility training;Therapeutic activities;Patient/family education;Neuromuscular re-education;Balance training;Therapeutic exercise;Manual techniques;Passive range of motion;Taping    PT Next Visit Plan Perform BERG balance and create LTG, hip flexor stretch, piriformis stretch, hip strengthening, hip and lumbar stretching, gait training.    PT Home Exercise Plan [email or web address]    Consulted and Agree with Plan of Care Patient           Patient will benefit from skilled therapeutic intervention in order to improve the following deficits and impairments:  Pain,Impaired flexibility,Decreased balance,Decreased strength,Decreased range of motion,Decreased activity tolerance,Difficulty walking,Postural dysfunction  Visit Diagnosis: Pain in left hip  Difficulty in walking, not elsewhere classified  Muscle weakness (generalized)     Problem List Patient Active Problem List   Diagnosis Date Noted  . Upper respiratory tract  infection 06/09/2020  . Atrial fibrillation with RVR ([place]) 07/15/2019  . Multinodular goiter (nontoxic)   . Asymptomatic bilateral carotid artery stenosis   . History of depression 02/14/2018  . History of breast cancer 02/14/2018  . Accelerated hypertension 02/14/2018  . Unstable angina ([place]) 10/11/2017  . Caloric malnutrition ([place]) 08/16/2017  . Weight loss, unintentional 03/12/2017  . GERD (gastroesophageal reflux disease) 08/10/2016  . Cough 08/10/2016  . Acute bronchitis 07/12/2016  . Bilateral carotid artery disease ([place]) 02/14/2016  . Chest pain 01/13/2016  . Osteoporosis 02/22/2015  . Stage 2 chronic kidney disease due to benign hypertension 08/16/2014  . Malignant neoplasm of upper-outer quadrant of right breast in female, estrogen receptor positive ([place]) 03/22/2014  . Dizziness and giddiness 11/27/2012  . Essential tremor 11/27/2012  . Numbness 11/27/2012  . TOBACCO USER 10/04/2009  .  CLOSED FRACTURE OF ONE RIB 07/12/2009  . Dyslipidemia, goal LDL below 70 09/28/2008  . CAD in native artery 09/28/2008  . Rhinitis 09/09/2008  . COPD (chronic obstructive pulmonary disease) ([place]) 09/07/2008  . Essential hypertension 09/06/2008  . SKIN CANCER, HX OF 09/06/2008    Oretha Caprice, PT, MPT 07/11/2020, 12:59 PM  [facility] Physical Therapy [address] [place], [place], [address] Phone: [phone]   Fax:  [phone]  Name: Brittany Kirk MRN: [id number] Date of Birth: 08/06/1945

## 2020-07-12 ENCOUNTER — Telehealth: Payer: Self-pay | Admitting: Pharmacist

## 2020-07-12 NOTE — Progress Notes (Addendum)
Chronic Care Management Pharmacy Assistant   Name: Brittany Kirk  MRN: [id number] DOB: 06-30-1945  Reason for Encounter: Disease State for HTN.  Patient Questions:  1.  Have you seen any other providers since your last visit? Yes.   2.  Any changes in your medicines or health? Yes.  PCP : Susy Frizzle, MD   Office Visits: 06/09/20 (Telemedicine) Fabian Sharp, NP. Upper Respiratory Tract Infection. STARTED Guaifensin 600 mg 2 times daily PRN.  05/26/20 Dr. Dennard Schaumann for Left Hip pain. STARTED Hydrocodone-Acetaminophen 5-325 mg 1 tablet every 6 hours, PRN. COMPLETED Prednisone 20 mg pack. 05/16/20 Dr. Dennard Schaumann for Sciatica. Xrays taken. STARTED Prednisone 20 mg pack. 01/05/20 Dr. Dennard Schaumann for Dysuria. STARTED Ciprofloxacin 250 mg   Consults: 07/11/20 Rehab Oretha Caprice, PT First therapy appointment. No medication changes.  06/01/20 Orthopedics Hilts, Mchael, MD. Scheduled for MRI. STARTED Tramadol HCL 50 mg every 6 hours, PRN. 04/05/20 Urology Dahlstedt, Annie Main, MD. Urine sent out for cytology. No medication changes.  03/18/20 Dermatology Procedure done/lesions. No information given. 03/15/20 Cardio Lorretta Harp, MD. STARTED Metoprolol Succinate 12.5 mg.  03/01/20 Urology Dahlstedt, Annie Main, MD . Urine sent out for cytology. No medication changes.   Hospital: 03/12/20 Lajean Saver, MD. For palpitations. Images taken, blood drawn. No medication changes.   Allergies:   Allergies  Allergen Reactions   Penicillins Anaphylaxis, Itching, Swelling and Rash    Has patient had a PCN reaction causing immediate rash, facial/tongue/throat swelling, SOB or lightheadedness with hypotension: Yes Has patient had a PCN reaction causing severe rash involving mucus membranes or skin necrosis: No Has patient had a PCN reaction that required hospitalization: Yes Has patient had a PCN reaction occurring within the last 10 years: No If all of the above answers are "NO", then  may proceed with Cephalosporin use.    Cefaclor Itching, Swelling and Rash   Latex Itching and Rash   Metronidazole Itching, Swelling and Rash   Naproxen Itching, Swelling and Rash   Nsaids Itching, Swelling and Rash    Ibuprofen IS tolerated   Septra [Bactrim] Itching, Swelling and Rash   Sulfonamide Derivatives Itching, Swelling and Rash   Tape Itching and Rash    Medications: Outpatient Encounter Medications as of 07/12/2020  Medication Sig   ascorbic acid (VITAMIN C) 500 MG tablet Take 500 mg by mouth daily.   atorvastatin (LIPITOR) 40 MG tablet TAKE 1 TABLET BY MOUTH EVERYDAY AT BEDTIME   cholecalciferol (VITAMIN D3) 25 MCG (1000 UNIT) tablet Take 1,000 Units by mouth daily.   Cyanocobalamin (B-12) 2500 MCG TABS Take 2,500 mcg by mouth daily.   diazepam (VALIUM) 5 MG tablet Take 5 mg by mouth daily as needed for anxiety.    diclofenac Sodium (VOLTAREN) 1 % GEL Apply 4 g topically 4 (four) times daily as needed.   ELIQUIS 5 MG TABS tablet TAKE 1 TABLET BY MOUTH TWICE A DAY   guaiFENesin (MUCINEX) 600 MG 12 hr tablet Take 1 tablet (600 mg total) by mouth 2 (two) times daily as needed for cough or to loosen phlegm.   HYDROcodone-acetaminophen (NORCO) 5-325 MG tablet Take 1 tablet by mouth every 6 (six) hours as needed for moderate pain.   metoprolol succinate (TOPROL XL) 25 MG 24 hr tablet Take 0.5 tablets (12.5 mg total) by mouth daily.   omeprazole (PRILOSEC) 20 MG capsule Take 20 mg by mouth daily.   traMADol (ULTRAM) 50 MG tablet Take 1 tablet (50 mg total) by mouth every  6 (six) hours as needed.   traZODone (DESYREL) 50 MG tablet TAKE 1/2 TO 1 TABLET BY MOUTH AT BEDTIME AS NEEDED FOR SLEEP   zinc gluconate 50 MG tablet Take 50 mg by mouth daily.   No facility-administered encounter medications on file as of 07/12/2020.    Current Diagnosis: Patient Active Problem List   Diagnosis Date Noted   Upper respiratory tract infection 06/09/2020   Atrial fibrillation with RVR ([place])  07/15/2019   Multinodular goiter (nontoxic)    Asymptomatic bilateral carotid artery stenosis    History of depression 02/14/2018   History of breast cancer 02/14/2018   Accelerated hypertension 02/14/2018   Unstable angina ([place]) 10/11/2017   Caloric malnutrition ([place]) 08/16/2017   Weight loss, unintentional 03/12/2017   GERD (gastroesophageal reflux disease) 08/10/2016   Cough 08/10/2016   Acute bronchitis 07/12/2016   Bilateral carotid artery disease ([place]) 02/14/2016   Chest pain 01/13/2016   Osteoporosis 02/22/2015   Stage 2 chronic kidney disease due to benign hypertension 08/16/2014   Malignant neoplasm of upper-outer quadrant of right breast in female, estrogen receptor positive ([place]) 03/22/2014   Dizziness and giddiness 11/27/2012   Essential tremor 11/27/2012   Numbness 11/27/2012   TOBACCO USER 10/04/2009   CLOSED FRACTURE OF ONE RIB 07/12/2009   Dyslipidemia, goal LDL below 70 09/28/2008   CAD in native artery 09/28/2008   Rhinitis 09/09/2008   COPD (chronic obstructive pulmonary disease) ([place]) 09/07/2008   Essential hypertension 09/06/2008   SKIN CANCER, HX OF 09/06/2008    Goals Addressed   None    Reviewed chart prior to disease state call. Spoke with patient regarding BP  Recent Office Vitals: BP Readings from Last 3 Encounters:  05/26/20 130/70  05/16/20 128/68  04/05/20 (!) 164/62   Pulse Readings from Last 3 Encounters:  05/26/20 (!) 54  05/16/20 88  04/05/20 60    Wt Readings from Last 3 Encounters:  05/26/20 118 lb (53.5 kg)  05/16/20 116 lb (52.6 kg)  04/05/20 116 lb 6.4 oz (52.8 kg)     Kidney Function Lab Results  Component Value Date/Time   CREATININE 0.86 03/12/2020 10:36 AM   CREATININE 0.84 09/29/2019 12:44 PM   CREATININE 0.76 07/15/2019 02:17 AM   CREATININE 0.80 03/27/2019 12:34 PM   CREATININE 1.0 02/25/2017 02:15 PM   CREATININE 1.1 02/23/2016 02:48 PM   GFRNONAA >60 03/12/2020 10:36 AM   GFRNONAA 68 09/29/2019 12:44 PM    GFRAA >60 03/12/2020 10:36 AM   GFRAA 79 09/29/2019 12:44 PM    BMP Latest Ref Rng & Units 03/12/2020 09/29/2019 07/15/2019  Glucose 70 - 99 mg/dL 147(H) 90 109(H)  BUN 8 - 23 mg/dL 11 22 20   Creatinine 0.44 - 1.00 mg/dL 0.86 0.84 0.76  BUN/Creat Ratio 6 - 22 (calc) - NOT APPLICABLE -  Sodium A999333 - 145 mmol/L 139 140 142  Potassium 3.5 - 5.1 mmol/L 4.0 4.6 3.6  Chloride 98 - 111 mmol/L 106 106 108  CO2 22 - 32 mmol/L 24 25 25   Calcium 8.9 - 10.3 mg/dL 8.5(L) 9.0 8.4(L)    Current antihypertensive regimen:  Metoprolol Succinate 25 mg Take 0.5 tablets (12.5 mg total) by mouth daily.  How often are you checking your Blood Pressure? several times per month   Current home BP readings: Patient stated her blood pressure has been running low around 90/60.  What recent interventions/DTPs have been made by any provider to improve Blood Pressure control since last CPP Visit: None  Any recent  hospitalizations or ED visits since last visit with CPP? Yes on 03/12/20 for palpitations.  What diet changes have been made to improve Blood Pressure Control?  Patient stated she has started cutting back on her sodium intake. She stated she tries to eat more chicken, salmon, whole grains, vegetables, and fruits. She stated she doesn't eat any fired foods.  What exercise is being done to improve your Blood Pressure Control?  Patent recently fell and has some hip pain. She stated is starting therapy to improve her mobility. She stated her mobility is getting better she has been able to do laundry/fold clothes, shower, and take steps without walker.   Adherence Review: Is the patient currently on ACE/ARB medication? No.  Does the patient have >5 day gap between last estimated fill dates? No  Patient stated she doesn't take her metoprolol succinate 12.5 mg tablet everyday but does it most of the time. She stated this is because her blood pressure has been so low she doesn't feel she needs to take it.     Follow-Up:  Pharmacist Review   Charlann Lange, RMA Clinical Pharmacist Assistant [phone]  4 minutes spent in review, coordination, and documentation. Will address metoprolol at upcoming PharmD visit. Reviewed by: Beverly Milch, PharmD Clinical Pharmacist [facility] [phone]

## 2020-07-12 NOTE — Telephone Encounter (Signed)
Spoke with patient - she has already reached out to [facility] and learned that the cost is her $480 deductible plus copay.  Discussed options of Xarelto, Pradaxa and warfarin.  Answered all her questions.  Patient will remain on Eliquis for now, but aware she can call office if any problems with cost in the future

## 2020-07-19 ENCOUNTER — Other Ambulatory Visit: Payer: Self-pay

## 2020-07-19 ENCOUNTER — Ambulatory Visit (INDEPENDENT_AMBULATORY_CARE_PROVIDER_SITE_OTHER): Payer: Medicare Other | Admitting: Physical Therapy

## 2020-07-19 ENCOUNTER — Encounter: Payer: Self-pay | Admitting: Physical Therapy

## 2020-07-19 DIAGNOSIS — M25552 Pain in left hip: Secondary | ICD-10-CM

## 2020-07-19 DIAGNOSIS — R262 Difficulty in walking, not elsewhere classified: Secondary | ICD-10-CM | POA: Diagnosis not present

## 2020-07-19 DIAGNOSIS — M6281 Muscle weakness (generalized): Secondary | ICD-10-CM | POA: Diagnosis not present

## 2020-07-19 NOTE — Therapy (Signed)
[facility] Physical Therapy [address] [place], [place], [address] Phone: [phone]   Fax:  [phone]  Physical Therapy Treatment  Patient Details  Name: Brittany Kirk MRN: [id number] Date of Birth: 19-Oct-1945 Referring Provider (PT): Eunice Blase, MD   Encounter Date: 07/19/2020   PT End of Session - 07/19/20 1311    Visit Number 2    Number of Visits 12    Date for PT Re-Evaluation 09/09/20    Authorization Type medicare    Progress Note Due on Visit 10    PT Start Time 1300    PT Stop Time 1340    PT Time Calculation (min) 40 min    Activity Tolerance Patient tolerated treatment well    Behavior During Therapy [facility] for tasks assessed/performed           Past Medical History:  Diagnosis Date  . Acid reflux   . Allergic rhinitis   . Anxiety   . Asymptomatic bilateral carotid artery stenosis    40-59% (02/2018)  . Atrial fibrillation ([place]) 07/2019  . Breast cancer ([place]) 2015   Right Breast Cancer  . CAD (coronary artery disease)   . Depression   . Emphysema    no longer uses inhalers  . Gallstones    nausea, pain upper right abdomen  . GERD (gastroesophageal reflux disease)   . H/O hiatal hernia   . Hiatal hernia   . History of skin cancer   . Hyperlipidemia   . Hypertension   . Multinodular goiter (nontoxic)   . Scoliosis   . Skin cancer   . Tobacco user   . Wears dentures    top    Past Surgical History:  Procedure Laterality Date  . BREAST LUMPECTOMY Right 2015  . CHOLECYSTECTOMY  07/08/2012   Procedure: LAPAROSCOPIC CHOLECYSTECTOMY WITH INTRAOPERATIVE CHOLANGIOGRAM;  Surgeon: Gayland Curry, MD,FACS;  Location: [facility];  Service: General;  Laterality: N/A;  Laparoscopic Cholecystectomy with Intraoperative Cholangiogram  . COLONOSCOPY    . ESOPHAGOSCOPY N/A 05/29/2017   Procedure: ESOPHAGOSCOPY;  Surgeon: Clarene Essex, MD;  Location: [facility];  Service: Endoscopy;  Laterality: N/A;  botox injection  . HAND SURGERY Left     wrist  . LEFT HEART CATH AND CORONARY ANGIOGRAPHY N/A 10/11/2017   Procedure: LEFT HEART CATH AND CORONARY ANGIOGRAPHY;  Surgeon: Martinique, Peter M, MD;  Location: [facility];  Service: Cardiovascular;  Laterality: N/A;  . TONSILECTOMY, ADENOIDECTOMY, BILATERAL MYRINGOTOMY AND TUBES    . TUBAL LIGATION      There were no vitals filed for this visit.   Subjective Assessment - 07/19/20 1308    Subjective Pt arriving to therapy reporting pain in left groin, 2/10 pain at rest and 6/10 pain when walking. Pt reporting has to lift her L leg onto the bed due to pain.    Pertinent History Pt recent MRI on 06/13/2020 with findings: acute nondisplaced fx L pubic root/anterior acetabulum, nondisplaced fx of L pubic bone inferior ramis, acute bilisacral alar fx, OA of anterisuperior labrus.    Limitations Sitting;Standing;Walking;House hold activities    How long can you sit comfortably? it's getting up after sitting that hurts    Patient Stated Goals Stop hurting    Currently in Pain? Yes    Pain Score 6     Pain Location Groin    Pain Orientation Left    Pain Descriptors / Indicators Aching;Sore    Pain Type Chronic pain    Pain Onset More than  a month ago    Pain Frequency Intermittent                             [facility] Adult PT Treatment/Exercise - 07/19/20 0001      Standardized Balance Assessment   Standardized Balance Assessment Berg Balance Test      Berg Balance Test   Sit to Stand Able to stand without using hands and stabilize independently    Standing Unsupported Able to stand safely 2 minutes    Sitting with Back Unsupported but Feet Supported on Floor or Stool Able to sit safely and securely 2 minutes    Stand to Sit Sits safely with minimal use of hands    Transfers Able to transfer safely, minor use of hands    Standing Unsupported with Eyes Closed Able to stand 10 seconds safely    Standing Ubsupported with Feet Together Able to place feet together  independently and stand for 1 minute with supervision    From Standing, Reach Forward with Outstretched Arm Can reach forward >12 cm safely (5")    From Standing Position, Pick up Object from Floor Able to pick up shoe, needs supervision    From Standing Position, Turn to Look Behind Over each Shoulder Looks behind one side only/other side shows less weight shift    Turn 360 Degrees Able to turn 360 degrees safely one side only in 4 seconds or less    Standing Unsupported, Alternately Place Feet on Step/Stool Able to stand independently and complete 8 steps >20 seconds    Standing Unsupported, One Foot in Front Able to plae foot ahead of the other independently and hold 30 seconds    Standing on One Leg Able to lift leg independently and hold equal to or more than 3 seconds    Total Score 47      Exercises   Exercises Knee/Hip      Knee/Hip Exercises: Stretches   Active Hamstring Stretch Right;Left;2 reps;30 seconds    Hip Flexor Stretch 2 reps;60 seconds;Left    Piriformis Stretch Left;3 reps;20 seconds      Knee/Hip Exercises: Seated   Sit to Sand 10 reps;without UE support      Knee/Hip Exercises: Supine   Hip Adduction Isometric Strengthening;15 reps    Hip Adduction Isometric Limitations holding 5 seconds each    Bridges Strengthening;Both;10 reps    Straight Leg Raises Strengthening;Left;10 reps    Other Supine Knee/Hip Exercises clam shells: L2 theraband x 10 holding 3 seconds each                    PT Short Term Goals - 07/19/20 1334      PT SHORT TERM GOAL #1   Title Pt will be independent in her initial HEP.    Status On-going      PT SHORT TERM GOAL #2   Title Will participate in balance assessments via [place] with LTG to be set.    Status On-going             PT Long Term Goals - 07/19/20 1347      PT LONG TERM GOAL #1   Title Pt will be able to demonstrate >/= 4+/5 in bilateral LE"s for improved functional mobility and gait.    Status  On-going      PT LONG TERM GOAL #2   Title Pt will be able to amb 500 feet without assistive  safely with normalized gait pattern on level surfaces.    Status On-going      PT LONG TERM GOAL #3   Title Pt will improve her FOTO from 52% to >/= 69% function.    Status On-going      PT LONG TERM GOAL #4   Title Pt will be able to improve her 5 time sit stand </= 14 seconds with no UE support.    Status On-going      PT LONG TERM GOAL #5   Title Pt will be able to report her L hip pain is </= 2/10 with transfers and walking house hold distances.    Status On-going      Additional Long Term Goals   Additional Long Term Goals Yes      PT LONG TERM GOAL #6   Title Pt will improve her BERG balance score from 47/56 to >/= 52/56    Baseline 47    Time 6    Period Weeks                 Plan - 07/19/20 1328    Clinical Impression Statement Pt arriving today reporting 2/10 pain in groin when sitting, and 6/10 pain in groin with standing/walking. Pt with tenderness around adductor mangus tendon on LLE. Pt tolerating gentle stretching and strengthening exercises today during session.BERG balance score of 47/56.  No changes in pt's HEP. Conitnue with skilled PT toward goals set.    Personal Factors and Comorbidities Age;Comorbidity 3+    Comorbidities Pt recent MRI on 06/13/2020 with findings: acute nondisplaced fx L pubic root/anterior acetabulum, nondisplaced fx of L pubic bone inferior ramis, acute bilisacral alar fx, OA of anterisuperior labrus. h/o skin cancer, A-fib, CAD, heart cath 2019, breast cancer    Examination-Activity Limitations Squat;Stairs;Transfers;Stand    Stability/Clinical Decision Making Stable/Uncomplicated    Rehab Potential Good    PT Frequency 2x / week    PT Duration 6 weeks    PT Treatment/Interventions ADLs/Self Care Home Management;Cryotherapy;Moist Heat;Gait training;Stair training;Functional mobility training;Therapeutic activities;Patient/family  education;Neuromuscular re-education;Balance training;Therapeutic exercise;Manual techniques;Passive range of motion;Taping    PT Next Visit Plan hip flexor stretch, piriformis stretch, hip strengthening, hip and lumbar stretching, gait training.    PT Home Exercise Plan [id number]    Consulted and Agree with Plan of Care Patient           Patient will benefit from skilled therapeutic intervention in order to improve the following deficits and impairments:  Pain,Impaired flexibility,Decreased balance,Decreased strength,Decreased range of motion,Decreased activity tolerance,Difficulty walking,Postural dysfunction  Visit Diagnosis: Pain in left hip  Difficulty in walking, not elsewhere classified  Muscle weakness (generalized)     Problem List Patient Active Problem List   Diagnosis Date Noted  . Upper respiratory tract infection 06/09/2020  . Atrial fibrillation with RVR ([place]) 07/15/2019  . Multinodular goiter (nontoxic)   . Asymptomatic bilateral carotid artery stenosis   . History of depression 02/14/2018  . History of breast cancer 02/14/2018  . Accelerated hypertension 02/14/2018  . Unstable angina ([place]) 10/11/2017  . Caloric malnutrition ([place]) 08/16/2017  . Weight loss, unintentional 03/12/2017  . GERD (gastroesophageal reflux disease) 08/10/2016  . Cough 08/10/2016  . Acute bronchitis 07/12/2016  . Bilateral carotid artery disease ([place]) 02/14/2016  . Chest pain 01/13/2016  . Osteoporosis 02/22/2015  . Stage 2 chronic kidney disease due to benign hypertension 08/16/2014  . Malignant neoplasm of upper-outer quadrant of right breast in female, estrogen receptor positive ([employer]) 03/22/2014  .  Dizziness and giddiness 11/27/2012  . Essential tremor 11/27/2012  . Numbness 11/27/2012  . TOBACCO USER 10/04/2009  . CLOSED FRACTURE OF ONE RIB 07/12/2009  . Dyslipidemia, goal LDL below 70 09/28/2008  . CAD in native artery 09/28/2008  . Rhinitis 09/09/2008  . COPD (chronic  obstructive pulmonary disease) ([place]) 09/07/2008  . Essential hypertension 09/06/2008  . SKIN CANCER, HX OF 09/06/2008    Oretha Caprice, PT, MPT 07/19/2020, 1:51 PM  [facility] Physical Therapy [address] [place], [place], [address] Phone: [phone]   Fax:  [phone]  Name: Brittany Kirk MRN: [id number] Date of Birth: 10/29/1945

## 2020-07-20 ENCOUNTER — Encounter: Payer: Self-pay | Admitting: Pharmacist

## 2020-07-20 ENCOUNTER — Telehealth: Payer: Self-pay | Admitting: Family Medicine

## 2020-07-20 MED ORDER — PREDNISONE 10 MG PO TABS: ORAL_TABLET | ORAL | 0 refills | Status: DC

## 2020-07-20 NOTE — Telephone Encounter (Signed)
Please advise 

## 2020-07-20 NOTE — Telephone Encounter (Signed)
This encounter was created in error - please disregard.

## 2020-07-20 NOTE — Telephone Encounter (Signed)
Pt called stating she's been going to PT and she's started having pain in her groin area, she states it gets really tight ans swells up. Pts physical therapist recommended she ask Dr. Junius Roads for a rx of prednisone and so she would like to have that called in if possible? Pt would like a CB to update her   [phone]

## 2020-07-20 NOTE — Telephone Encounter (Signed)
I called and advised the patient. 

## 2020-07-21 ENCOUNTER — Encounter: Payer: Self-pay | Admitting: Physical Therapy

## 2020-07-21 ENCOUNTER — Ambulatory Visit (INDEPENDENT_AMBULATORY_CARE_PROVIDER_SITE_OTHER): Payer: Medicare Other | Admitting: Physical Therapy

## 2020-07-21 ENCOUNTER — Other Ambulatory Visit: Payer: Self-pay

## 2020-07-21 DIAGNOSIS — R262 Difficulty in walking, not elsewhere classified: Secondary | ICD-10-CM | POA: Diagnosis not present

## 2020-07-21 DIAGNOSIS — M25552 Pain in left hip: Secondary | ICD-10-CM

## 2020-07-21 DIAGNOSIS — M6281 Muscle weakness (generalized): Secondary | ICD-10-CM

## 2020-07-21 NOTE — Therapy (Signed)
[facility] [address] [place], [place], [address] Phone: [phone]   Fax:  [phone]  Physical Therapy Treatment  Patient Details  Name: Brittany Kirk MRN: [id number] Date of Birth: 24-Feb-1946 Referring Provider (PT): Eunice Blase, MD   Encounter Date: 07/21/2020   PT End of Session - 07/21/20 1335    Visit Number 3    Number of Visits 12    Date for PT Re-Evaluation 09/09/20    Authorization Type medicare    Progress Note Due on Visit 10    PT Start Time 1255    PT Stop Time 1335    PT Time Calculation (min) 40 min    Activity Tolerance Patient tolerated treatment well    Behavior During Therapy [facility] for tasks assessed/performed           Past Medical History:  Diagnosis Date  . Acid reflux   . Allergic rhinitis   . Anxiety   . Asymptomatic bilateral carotid artery stenosis    40-59% (02/2018)  . Atrial fibrillation ([place]) 07/2019  . Breast cancer ([place]) 2015   Right Breast Cancer  . CAD (coronary artery disease)   . Depression   . Emphysema    no longer uses inhalers  . Gallstones    nausea, pain upper right abdomen  . GERD (gastroesophageal reflux disease)   . H/O hiatal hernia   . Hiatal hernia   . History of skin cancer   . Hyperlipidemia   . Hypertension   . Multinodular goiter (nontoxic)   . Scoliosis   . Skin cancer   . Tobacco user   . Wears dentures    top    Past Surgical History:  Procedure Laterality Date  . BREAST LUMPECTOMY Right 2015  . CHOLECYSTECTOMY  07/08/2012   Procedure: LAPAROSCOPIC CHOLECYSTECTOMY WITH INTRAOPERATIVE CHOLANGIOGRAM;  Surgeon: Gayland Curry, MD,FACS;  Location: [facility];  Service: General;  Laterality: N/A;  Laparoscopic Cholecystectomy with Intraoperative Cholangiogram  . COLONOSCOPY    . ESOPHAGOSCOPY N/A 05/29/2017   Procedure: ESOPHAGOSCOPY;  Surgeon: Clarene Essex, MD;  Location: [place];  Service: Endoscopy;  Laterality: N/A;  botox injection  . HAND SURGERY Left     wrist  . LEFT HEART CATH AND CORONARY ANGIOGRAPHY N/A 10/11/2017   Procedure: LEFT HEART CATH AND CORONARY ANGIOGRAPHY;  Surgeon: Martinique, Peter M, MD;  Location: [place] CV LAB;  Service: Cardiovascular;  Laterality: N/A;  . TONSILECTOMY, ADENOIDECTOMY, BILATERAL MYRINGOTOMY AND TUBES    . TUBAL LIGATION      There were no vitals filed for this visit.   Subjective Assessment - 07/21/20 1259    Subjective Pain in Lt groin is better today; Tuesday was really bad.    Pertinent History Pt recent MRI on 06/13/2020 with findings: acute nondisplaced fx L pubic root/anterior acetabulum, nondisplaced fx of L pubic bone inferior ramis, acute bilisacral alar fx, OA of anterisuperior labrus.    Limitations Sitting;Standing;Walking;House hold activities    How long can you sit comfortably? it's getting up after sitting that hurts    Patient Stated Goals Stop hurting    Currently in Pain? Yes    Pain Score 4     Pain Location Groin    Pain Orientation Left    Pain Descriptors / Indicators Aching;Sore    Pain Type Chronic pain    Pain Onset More than a month ago    Pain Frequency Intermittent    Aggravating Factors  standing after prolonged sitting, walking, IR  of Lt hip    Pain Relieving Factors lying, changing positions                             [facility] Adult PT Treatment/Exercise - 07/21/20 1300      Knee/Hip Exercises: Stretches   Passive Hamstring Stretch Both;3 reps;30 seconds    Passive Hamstring Stretch Limitations seated      Knee/Hip Exercises: Aerobic   Recumbent Bike 8 min; no resistance; slow pedaling      Knee/Hip Exercises: Standing   Heel Raises Both;10 reps    Hip Abduction Both;10 reps;Knee straight    Hip Extension Both;10 reps;Knee straight      Knee/Hip Exercises: Supine   Bridges with Ball Squeeze 2 sets;10 reps    Other Supine Knee/Hip Exercises Lt hooklying adduction/abduction 10 x 10 sec hold                    PT Short Term  Goals - 07/21/20 1335      PT SHORT TERM GOAL #1   Title Pt will be independent in her initial HEP.    Status Achieved      PT SHORT TERM GOAL #2   Title Will participate in balance assessments via [place] with LTG to be set.    Status Achieved   met last session            PT Long Term Goals - 07/19/20 1347      PT LONG TERM GOAL #1   Title Pt will be able to demonstrate >/= 4+/5 in bilateral LE"s for improved functional mobility and gait.    Status On-going      PT LONG TERM GOAL #2   Title Pt will be able to amb 500 feet without assistive safely with normalized gait pattern on level surfaces.    Status On-going      PT LONG TERM GOAL #3   Title Pt will improve her FOTO from 52% to >/= 69% function.    Status On-going      PT LONG TERM GOAL #4   Title Pt will be able to improve her 5 time sit stand </= 14 seconds with no UE support.    Status On-going      PT LONG TERM GOAL #5   Title Pt will be able to report her L hip pain is </= 2/10 with transfers and walking house hold distances.    Status On-going      Additional Long Term Goals   Additional Long Term Goals Yes      PT LONG TERM GOAL #6   Title Pt will improve her BERG balance score from 47/56 to >/= 52/56    Baseline 47    Time 6    Period Weeks                 Plan - 07/21/20 1335    Clinical Impression Statement Pt tolerated session well today with minimal pain with exercises and able to perform standing exercises today.  Will continue to benefit from PT to maximize function.    Personal Factors and Comorbidities Age;Comorbidity 3+    Comorbidities Pt recent MRI on 06/13/2020 with findings: acute nondisplaced fx L pubic root/anterior acetabulum, nondisplaced fx of L pubic bone inferior ramis, acute bilisacral alar fx, OA of anterisuperior labrus. h/o skin cancer, A-fib, CAD, heart cath 2019, breast cancer    Examination-Activity Limitations Squat;Stairs;Transfers;Stand  Stability/Clinical  Decision Making Stable/Uncomplicated    Rehab Potential Good    PT Frequency 2x / week    PT Duration 6 weeks    PT Treatment/Interventions ADLs/Self Care Home Management;Cryotherapy;Moist Heat;Gait training;Stair training;Functional mobility training;Therapeutic activities;Patient/family education;Neuromuscular re-education;Balance training;Therapeutic exercise;Manual techniques;Passive range of motion;Taping    PT Next Visit Plan hip flexor stretch, piriformis stretch, hip strengthening, hip and lumbar stretching, gait training.    PT Home Exercise Plan [id number]    Consulted and Agree with Plan of Care Patient           Patient will benefit from skilled therapeutic intervention in order to improve the following deficits and impairments:  Pain,Impaired flexibility,Decreased balance,Decreased strength,Decreased range of motion,Decreased activity tolerance,Difficulty walking,Postural dysfunction  Visit Diagnosis: Pain in left hip  Difficulty in walking, not elsewhere classified  Muscle weakness (generalized)     Problem List Patient Active Problem List   Diagnosis Date Noted  . Upper respiratory tract infection 06/09/2020  . Atrial fibrillation with RVR ([place]) 07/15/2019  . Multinodular goiter (nontoxic)   . Asymptomatic bilateral carotid artery stenosis   . History of depression 02/14/2018  . History of breast cancer 02/14/2018  . Accelerated hypertension 02/14/2018  . Unstable angina ([place]) 10/11/2017  . Caloric malnutrition ([place]) 08/16/2017  . Weight loss, unintentional 03/12/2017  . GERD (gastroesophageal reflux disease) 08/10/2016  . Cough 08/10/2016  . Acute bronchitis 07/12/2016  . Bilateral carotid artery disease ([place]) 02/14/2016  . Chest pain 01/13/2016  . Osteoporosis 02/22/2015  . Stage 2 chronic kidney disease due to benign hypertension 08/16/2014  . Malignant neoplasm of upper-outer quadrant of right breast in female, estrogen receptor positive ([place]) 03/22/2014   . Dizziness and giddiness 11/27/2012  . Essential tremor 11/27/2012  . Numbness 11/27/2012  . TOBACCO USER 10/04/2009  . CLOSED FRACTURE OF ONE RIB 07/12/2009  . Dyslipidemia, goal LDL below 70 09/28/2008  . CAD in native artery 09/28/2008  . Rhinitis 09/09/2008  . COPD (chronic obstructive pulmonary disease) ([place]) 09/07/2008  . Essential hypertension 09/06/2008  . SKIN CANCER, HX OF 09/06/2008      Laureen Abrahams, PT, DPT 07/21/20 1:38 PM    [facility] [address] [place], [place], [address] Phone: [phone]   Fax:  [phone]  Name: ZIVA NUNZIATA MRN: [id number] Date of Birth: [birth year]

## 2020-07-25 ENCOUNTER — Encounter: Payer: Self-pay | Admitting: Family Medicine

## 2020-07-26 ENCOUNTER — Encounter: Payer: Self-pay | Admitting: Physical Therapy

## 2020-07-26 ENCOUNTER — Other Ambulatory Visit: Payer: Self-pay

## 2020-07-26 ENCOUNTER — Ambulatory Visit (INDEPENDENT_AMBULATORY_CARE_PROVIDER_SITE_OTHER): Payer: Medicare Other | Admitting: Physical Therapy

## 2020-07-26 DIAGNOSIS — M6281 Muscle weakness (generalized): Secondary | ICD-10-CM | POA: Diagnosis not present

## 2020-07-26 DIAGNOSIS — R262 Difficulty in walking, not elsewhere classified: Secondary | ICD-10-CM

## 2020-07-26 DIAGNOSIS — M25552 Pain in left hip: Secondary | ICD-10-CM

## 2020-07-26 NOTE — Therapy (Signed)
[facility] [address] [place], [place], [address] Phone: [phone]   Fax:  [phone]  Physical Therapy Treatment  Patient Details  Name: Brittany Kirk MRN: [id number] Date of Birth: Mar 23, 1946 Referring Provider (PT): Eunice Blase, MD   Encounter Date: 07/26/2020   PT End of Session - 07/26/20 1406    Visit Number 4    Number of Visits 12    Date for PT Re-Evaluation 09/09/20    Authorization Type medicare    Progress Note Due on Visit 10    PT Start Time 1346    PT Stop Time 1430    PT Time Calculation (min) 44 min    Activity Tolerance Patient tolerated treatment well    Behavior During Therapy [facility] for tasks assessed/performed           Past Medical History:  Diagnosis Date  . Acid reflux   . Allergic rhinitis   . Anxiety   . Asymptomatic bilateral carotid artery stenosis    40-59% (02/2018)  . Atrial fibrillation ([place]) 07/2019  . Breast cancer ([place]) 2015   Right Breast Cancer  . CAD (coronary artery disease)   . Depression   . Emphysema    no longer uses inhalers  . Gallstones    nausea, pain upper right abdomen  . GERD (gastroesophageal reflux disease)   . H/O hiatal hernia   . Hiatal hernia   . History of skin cancer   . Hyperlipidemia   . Hypertension   . Multinodular goiter (nontoxic)   . Scoliosis   . Skin cancer   . Tobacco user   . Wears dentures    top    Past Surgical History:  Procedure Laterality Date  . BREAST LUMPECTOMY Right 2015  . CHOLECYSTECTOMY  07/08/2012   Procedure: LAPAROSCOPIC CHOLECYSTECTOMY WITH INTRAOPERATIVE CHOLANGIOGRAM;  Surgeon: Gayland Curry, MD,FACS;  [facility];  Service: General;  Laterality: N/A;  Laparoscopic Cholecystectomy with Intraoperative Cholangiogram  . COLONOSCOPY    . ESOPHAGOSCOPY N/A 05/29/2017   Procedure: ESOPHAGOSCOPY;  Surgeon: Clarene Essex, MD;  Location: [place];  Service: Endoscopy;  Laterality: N/A;  botox injection  . HAND SURGERY Left     wrist  . LEFT HEART CATH AND CORONARY ANGIOGRAPHY N/A 10/11/2017   Procedure: LEFT HEART CATH AND CORONARY ANGIOGRAPHY;  Surgeon: Martinique, Peter M, MD;  Location: [place] CV LAB;  Service: Cardiovascular;  Laterality: N/A;  . TONSILECTOMY, ADENOIDECTOMY, BILATERAL MYRINGOTOMY AND TUBES    . TUBAL LIGATION      There were no vitals filed for this visit.   Subjective Assessment - 07/26/20 1402    Subjective Pt reporting pain in left groin and has not been going down the leg.    Pertinent History Pt recent MRI on 06/13/2020 with findings: acute nondisplaced fx L pubic root/anterior acetabulum, nondisplaced fx of L pubic bone inferior ramis, acute bilisacral alar fx, OA of anterisuperior labrus.    Limitations Sitting;Standing;Walking;House hold activities    How long can you sit comfortably? it's getting up after sitting that hurts    Patient Stated Goals Stop hurting    Currently in Pain? Yes    Pain Score 3     Pain Location Groin    Pain Orientation Left    Pain Descriptors / Indicators Aching;Sore    Pain Type Chronic pain    Pain Onset More than a month ago  [facility] Adult PT Treatment/Exercise - 07/26/20 0001      Knee/Hip Exercises: Stretches   Passive Hamstring Stretch Both;3 reps;30 seconds    Passive Hamstring Stretch Limitations supine      Knee/Hip Exercises: Aerobic   Nustep L4 x 6 minutes      Knee/Hip Exercises: Standing   Heel Raises Both;10 reps    Hip Abduction Both;10 reps;Knee straight    Hip Extension Both;10 reps;Knee straight      Knee/Hip Exercises: Supine   Bridges with Ball Squeeze 2 sets;10 reps    Other Supine Knee/Hip Exercises clam shells L2 theraband 2x10    Other Supine Knee/Hip Exercises Lt hooklying adduction/abduction 10 x 10 sec hold      Modalities   Modalities Moist Heat      Moist Heat Therapy   Number Minutes Moist Heat 5 Minutes    Moist Heat Location Other (comment)   L groin      Manual Therapy   Manual Therapy Soft tissue mobilization    Manual therapy comments 3  minutes    Soft tissue mobilization IASTM to L groin adductor tendon                    PT Short Term Goals - 07/26/20 1421      PT SHORT TERM GOAL #1   Title Pt will be independent in her initial HEP.    Baseline Met 08/29/16    Status Achieved      PT SHORT TERM GOAL #2   Title Will participate in balance assessments via [place] with LTG to be set.    Status Achieved             PT Long Term Goals - 07/26/20 1421      PT LONG TERM GOAL #1   Title Pt will be able to demonstrate >/= 4+/5 in bilateral LE"s for improved functional mobility and gait.    Status On-going      PT LONG TERM GOAL #2   Title Pt will be able to amb 500 feet without assistive safely with normalized gait pattern on level surfaces.    Status On-going      PT LONG TERM GOAL #3   Title Pt will improve her FOTO from 52% to >/= 69% function.    Status On-going      PT LONG TERM GOAL #4   Title Pt will be able to improve her 5 time sit stand </= 14 seconds with no UE support.    Status On-going      PT LONG TERM GOAL #5   Title Pt will be able to report her L hip pain is </= 2/10 with transfers and walking house hold distances.    Status On-going                 Plan - 07/26/20 1410    Clinical Impression Statement Pt tolerating exercises well with no increased pain reported during session. Pt progressing with standing activites. Continue skilled PT to progress toward pt's PLOF.    Personal Factors and Comorbidities Age;Comorbidity 3+    Comorbidities Pt recent MRI on 06/13/2020 with findings: acute nondisplaced fx L pubic root/anterior acetabulum, nondisplaced fx of L pubic bone inferior ramis, acute bilisacral alar fx, OA of anterisuperior labrus. h/o skin cancer, A-fib, CAD, heart cath 2019, breast cancer    Examination-Activity Limitations Squat;Stairs;Transfers;Stand     Examination-Participation Restrictions Cleaning;Community Activity;Driving;Other    Stability/Clinical Decision Making Stable/Uncomplicated  Rehab Potential Good    PT Frequency 2x / week    PT Duration 6 weeks    PT Treatment/Interventions ADLs/Self Care Home Management;Cryotherapy;Moist Heat;Gait training;Stair training;Functional mobility training;Therapeutic activities;Patient/family education;Neuromuscular re-education;Balance training;Therapeutic exercise;Manual techniques;Passive range of motion;Taping    PT Next Visit Plan hip flexor stretch, piriformis stretch, hip strengthening, hip and lumbar stretching, gait training.    PT Home Exercise Plan [id number]    Consulted and Agree with Plan of Care Patient           Patient will benefit from skilled therapeutic intervention in order to improve the following deficits and impairments:  Pain,Impaired flexibility,Decreased balance,Decreased strength,Decreased range of motion,Decreased activity tolerance,Difficulty walking,Postural dysfunction  Visit Diagnosis: Pain in left hip  Difficulty in walking, not elsewhere classified  Muscle weakness (generalized)     Problem List Patient Active Problem List   Diagnosis Date Noted  . Upper respiratory tract infection 06/09/2020  . Atrial fibrillation with RVR ([place]) 07/15/2019  . Multinodular goiter (nontoxic)   . Asymptomatic bilateral carotid artery stenosis   . History of depression 02/14/2018  . History of breast cancer 02/14/2018  . Accelerated hypertension 02/14/2018  . Unstable angina ([place]) 10/11/2017  . Caloric malnutrition ([place]) 08/16/2017  . Weight loss, unintentional 03/12/2017  . GERD (gastroesophageal reflux disease) 08/10/2016  . Cough 08/10/2016  . Acute bronchitis 07/12/2016  . Bilateral carotid artery disease ([place]) 02/14/2016  . Chest pain 01/13/2016  . Osteoporosis 02/22/2015  . Stage 2 chronic kidney disease due to benign hypertension 08/16/2014  . Malignant  neoplasm of upper-outer quadrant of right breast in female, estrogen receptor positive ([place]) 03/22/2014  . Dizziness and giddiness 11/27/2012  . Essential tremor 11/27/2012  . Numbness 11/27/2012  . TOBACCO USER 10/04/2009  . CLOSED FRACTURE OF ONE RIB 07/12/2009  . Dyslipidemia, goal LDL below 70 09/28/2008  . CAD in native artery 09/28/2008  . Rhinitis 09/09/2008  . COPD (chronic obstructive pulmonary disease) ([place]) 09/07/2008  . Essential hypertension 09/06/2008  . SKIN CANCER, HX OF 09/06/2008    Oretha Caprice, PT, MPT 07/26/2020, 2:32 PM  [facility] [address] [place], [place], [address] Phone: [phone]   Fax:  [phone]  Name: Brittany Kirk MRN: [id number] Date of Birth: 04/14/1946

## 2020-07-29 ENCOUNTER — Ambulatory Visit (INDEPENDENT_AMBULATORY_CARE_PROVIDER_SITE_OTHER): Payer: Medicare Other | Admitting: Physical Therapy

## 2020-07-29 ENCOUNTER — Encounter: Payer: Self-pay | Admitting: Physical Therapy

## 2020-07-29 ENCOUNTER — Other Ambulatory Visit: Payer: Self-pay

## 2020-07-29 DIAGNOSIS — R262 Difficulty in walking, not elsewhere classified: Secondary | ICD-10-CM | POA: Diagnosis not present

## 2020-07-29 DIAGNOSIS — M25552 Pain in left hip: Secondary | ICD-10-CM

## 2020-07-29 DIAGNOSIS — M6281 Muscle weakness (generalized): Secondary | ICD-10-CM

## 2020-07-29 NOTE — Therapy (Signed)
[facility] Physical Therapy [address] [place], [place], [address] Phone: [phone]   Fax:  [phone]  Physical Therapy Treatment  Patient Details  Name: Brittany Kirk MRN: [id number] Date of Birth: [birth year] Referring Provider (PT): Eunice Blase, MD   Encounter Date: 07/29/2020   PT End of Session - 07/29/20 1137    Visit Number 5    Number of Visits 12    Date for PT Re-Evaluation 09/09/20    Authorization Type medicare    Progress Note Due on Visit 10    PT Start Time 1056    PT Stop Time 1136    PT Time Calculation (min) 40 min    Activity Tolerance Patient tolerated treatment well    Behavior During Therapy [facility] for tasks assessed/performed           Past Medical History:  Diagnosis Date  . Acid reflux   . Allergic rhinitis   . Anxiety   . Asymptomatic bilateral carotid artery stenosis    40-59% (02/2018)  . Atrial fibrillation ([place]) 07/2019  . Breast cancer ([place]) 2015   Right Breast Cancer  . CAD (coronary artery disease)   . Depression   . Emphysema    no longer uses inhalers  . Gallstones    nausea, pain upper right abdomen  . GERD (gastroesophageal reflux disease)   . H/O hiatal hernia   . Hiatal hernia   . History of skin cancer   . Hyperlipidemia   . Hypertension   . Multinodular goiter (nontoxic)   . Scoliosis   . Skin cancer   . Tobacco user   . Wears dentures    top    Past Surgical History:  Procedure Laterality Date  . BREAST LUMPECTOMY Right 2015  . CHOLECYSTECTOMY  07/08/2012   Procedure: LAPAROSCOPIC CHOLECYSTECTOMY WITH INTRAOPERATIVE CHOLANGIOGRAM;  Surgeon: Gayland Curry, MD,FACS;  Location: [facility];  Service: General;  Laterality: N/A;  Laparoscopic Cholecystectomy with Intraoperative Cholangiogram  . COLONOSCOPY    . ESOPHAGOSCOPY N/A 05/29/2017   Procedure: ESOPHAGOSCOPY;  Surgeon: Clarene Essex, MD;  Location: [place];  Service: Endoscopy;  Laterality: N/A;  botox injection  . HAND SURGERY Left     wrist  . LEFT HEART CATH AND CORONARY ANGIOGRAPHY N/A 10/11/2017   Procedure: LEFT HEART CATH AND CORONARY ANGIOGRAPHY;  Surgeon: Martinique, Peter M, MD;  Location: [place] CV LAB;  Service: Cardiovascular;  Laterality: N/A;  . TONSILECTOMY, ADENOIDECTOMY, BILATERAL MYRINGOTOMY AND TUBES    . TUBAL LIGATION      There were no vitals filed for this visit.   Subjective Assessment - 07/29/20 1058    Subjective arrives today without RW.   tendon in groin is feeling better.    Pertinent History Pt recent MRI on 06/13/2020 with findings: acute nondisplaced fx L pubic root/anterior acetabulum, nondisplaced fx of L pubic bone inferior ramis, acute bilisacral alar fx, OA of anterisuperior labrus.    Limitations Sitting;Standing;Walking;House hold activities    How long can you sit comfortably? it's getting up after sitting that hurts    Patient Stated Goals Stop hurting    Currently in Pain? No/denies                             [facility] Adult PT Treatment/Exercise - 07/29/20 1059      Knee/Hip Exercises: Stretches   Passive Hamstring Stretch Both;3 reps;30 seconds    Passive Hamstring Stretch Limitations supine  Knee/Hip Exercises: Aerobic   Nustep L5 x 8 min      Knee/Hip Exercises: Machines for Strengthening   Cybex Leg Press 75# 3x10      Knee/Hip Exercises: Standing   Heel Raises Both;2 sets;10 reps               Balance Exercises - 07/29/20 1113      Balance Exercises: Standing   Standing Eyes Opened Foam/compliant surface;Head turns;Narrow base of support (BOS)    Standing Eyes Closed Foam/compliant surface;Narrow base of support (BOS);5 reps;10 secs    Tandem Stance Eyes open;Intermittent upper extremity support;2 reps;30 secs    SLS Eyes open;Upper extremity support 1;2 reps;15 secs    Step Ups Forward;4 inch;Intermittent UE support;Lateral   x10 reps bil              PT Short Term Goals - 07/26/20 1421      PT SHORT TERM GOAL #1   Title  Pt will be independent in her initial HEP.    Baseline Met 08/29/16    Status Achieved      PT SHORT TERM GOAL #2   Title Will participate in balance assessments via [employer] with LTG to be set.    Status Achieved             PT Long Term Goals - 07/29/20 1138      PT LONG TERM GOAL #1   Title Pt will be able to demonstrate >/= 4+/5 in bilateral LE"s for improved functional mobility and gait.    Status On-going      PT LONG TERM GOAL #2   Title Pt will be able to amb 500 feet without assistive safely with normalized gait pattern on level surfaces.    Status On-going      PT LONG TERM GOAL #3   Title Pt will improve her FOTO from 52% to >/= 69% function.    Status On-going      PT LONG TERM GOAL #4   Title Pt will be able to improve her 5 time sit stand </= 14 seconds with no UE support.    Status On-going      PT LONG TERM GOAL #5   Title Pt will be able to report her L hip pain is </= 2/10 with transfers and walking house hold distances.    Status On-going      PT LONG TERM GOAL #6   Title Pt will improve her BERG balance score from 47/56 to >/= 52/56    Status On-going                 Plan - 07/29/20 1138    Clinical Impression Statement Pt arrived today without AD and is amb independently and safely on level surfaces at this time.  Progressed balance exercises today and tolerated well with good recovery of postural sway on compliant surfaces.  Will continue to benefit from PT to maximize function.    Personal Factors and Comorbidities Age;Comorbidity 3+    Comorbidities Pt recent MRI on 06/13/2020 with findings: acute nondisplaced fx L pubic root/anterior acetabulum, nondisplaced fx of L pubic bone inferior ramis, acute bilisacral alar fx, OA of anterisuperior labrus. h/o skin cancer, A-fib, CAD, heart cath 2019, breast cancer    Examination-Activity Limitations Squat;Stairs;Transfers;Stand    Examination-Participation Restrictions Cleaning;Community  Activity;Driving;Other    Stability/Clinical Decision Making Stable/Uncomplicated    Rehab Potential Good    PT Frequency 2x / week    PT  Duration 6 weeks    PT Treatment/Interventions ADLs/Self Care Home Management;Cryotherapy;Moist Heat;Gait training;Stair training;Functional mobility training;Therapeutic activities;Patient/family education;Neuromuscular re-education;Balance training;Therapeutic exercise;Manual techniques;Passive range of motion;Taping    PT Next Visit Plan hip flexor stretch, piriformis stretch, hip strengthening, hip and lumbar stretching, gait training.    PT Home Exercise Plan [id number]    Consulted and Agree with Plan of Care Patient           Patient will benefit from skilled therapeutic intervention in order to improve the following deficits and impairments:  Pain,Impaired flexibility,Decreased balance,Decreased strength,Decreased range of motion,Decreased activity tolerance,Difficulty walking,Postural dysfunction  Visit Diagnosis: Pain in left hip  Difficulty in walking, not elsewhere classified  Muscle weakness (generalized)     Problem List Patient Active Problem List   Diagnosis Date Noted  . Upper respiratory tract infection 06/09/2020  . Atrial fibrillation with RVR ([place]) 07/15/2019  . Multinodular goiter (nontoxic)   . Asymptomatic bilateral carotid artery stenosis   . History of depression 02/14/2018  . History of breast cancer 02/14/2018  . Accelerated hypertension 02/14/2018  . Unstable angina ([place]) 10/11/2017  . Caloric malnutrition ([place]) 08/16/2017  . Weight loss, unintentional 03/12/2017  . GERD (gastroesophageal reflux disease) 08/10/2016  . Cough 08/10/2016  . Acute bronchitis 07/12/2016  . Bilateral carotid artery disease ([place]) 02/14/2016  . Chest pain 01/13/2016  . Osteoporosis 02/22/2015  . Stage 2 chronic kidney disease due to benign hypertension 08/16/2014  . Malignant neoplasm of upper-outer quadrant of right breast in  female, estrogen receptor positive ([place]) 03/22/2014  . Dizziness and giddiness 11/27/2012  . Essential tremor 11/27/2012  . Numbness 11/27/2012  . TOBACCO USER 10/04/2009  . CLOSED FRACTURE OF ONE RIB 07/12/2009  . Dyslipidemia, goal LDL below 70 09/28/2008  . CAD in native artery 09/28/2008  . Rhinitis 09/09/2008  . COPD (chronic obstructive pulmonary disease) ([place]) 09/07/2008  . Essential hypertension 09/06/2008  . SKIN CANCER, HX OF 09/06/2008      Laureen Abrahams, PT, DPT 07/29/20 11:39 AM     [facility] Physical Therapy [address] [place], [place], [address] Phone: [phone]   Fax:  [phone]  Name: ALLYCE BOCHICCHIO MRN: [id number] Date of Birth: June 29, 1945

## 2020-07-31 ENCOUNTER — Encounter (HOSPITAL_COMMUNITY): Payer: Self-pay | Admitting: Emergency Medicine

## 2020-07-31 ENCOUNTER — Emergency Department (HOSPITAL_BASED_OUTPATIENT_CLINIC_OR_DEPARTMENT_OTHER): Payer: Medicare Other

## 2020-07-31 ENCOUNTER — Emergency Department (HOSPITAL_COMMUNITY): Payer: Medicare Other

## 2020-07-31 ENCOUNTER — Other Ambulatory Visit: Payer: Self-pay

## 2020-07-31 ENCOUNTER — Observation Stay (HOSPITAL_COMMUNITY)
Admission: EM | Admit: 2020-07-31 | Discharge: 2020-08-01 | Disposition: A | Discharge status: Home / Self Care | Payer: Medicare Other | Attending: Cardiology | Admitting: Cardiology

## 2020-07-31 DIAGNOSIS — R079 Chest pain, unspecified: Secondary | ICD-10-CM | POA: Diagnosis not present

## 2020-07-31 DIAGNOSIS — Z853 Personal history of malignant neoplasm of breast: Secondary | ICD-10-CM | POA: Diagnosis not present

## 2020-07-31 DIAGNOSIS — Z20822 Contact with and (suspected) exposure to covid-19: Secondary | ICD-10-CM | POA: Diagnosis not present

## 2020-07-31 DIAGNOSIS — I2510 Atherosclerotic heart disease of native coronary artery without angina pectoris: Secondary | ICD-10-CM | POA: Insufficient documentation

## 2020-07-31 DIAGNOSIS — Z85828 Personal history of other malignant neoplasm of skin: Secondary | ICD-10-CM | POA: Diagnosis not present

## 2020-07-31 DIAGNOSIS — Z87891 Personal history of nicotine dependence: Secondary | ICD-10-CM | POA: Insufficient documentation

## 2020-07-31 DIAGNOSIS — R000 Tachycardia, unspecified: Secondary | ICD-10-CM | POA: Diagnosis not present

## 2020-07-31 DIAGNOSIS — J449 Chronic obstructive pulmonary disease, unspecified: Secondary | ICD-10-CM | POA: Diagnosis not present

## 2020-07-31 DIAGNOSIS — I4891 Unspecified atrial fibrillation: Secondary | ICD-10-CM | POA: Diagnosis not present

## 2020-07-31 DIAGNOSIS — R0602 Shortness of breath: Secondary | ICD-10-CM | POA: Diagnosis not present

## 2020-07-31 DIAGNOSIS — I213 ST elevation (STEMI) myocardial infarction of unspecified site: Secondary | ICD-10-CM | POA: Diagnosis not present

## 2020-07-31 DIAGNOSIS — N182 Chronic kidney disease, stage 2 (mild): Secondary | ICD-10-CM | POA: Diagnosis not present

## 2020-07-31 DIAGNOSIS — R0789 Other chest pain: Secondary | ICD-10-CM | POA: Diagnosis not present

## 2020-07-31 DIAGNOSIS — Z7901 Long term (current) use of anticoagulants: Secondary | ICD-10-CM | POA: Diagnosis not present

## 2020-07-31 DIAGNOSIS — R072 Precordial pain: Secondary | ICD-10-CM

## 2020-07-31 DIAGNOSIS — I129 Hypertensive chronic kidney disease with stage 1 through stage 4 chronic kidney disease, or unspecified chronic kidney disease: Secondary | ICD-10-CM | POA: Diagnosis not present

## 2020-07-31 DIAGNOSIS — R778 Other specified abnormalities of plasma proteins: Secondary | ICD-10-CM | POA: Diagnosis not present

## 2020-07-31 LAB — CBC
HCT: 45 % (ref 36.0–46.0)
Hemoglobin: 14.3 g/dL (ref 12.0–15.0)
MCH: 32.6 pg (ref 26.0–34.0)
MCHC: 31.8 g/dL (ref 30.0–36.0)
MCV: 102.5 fL — ABNORMAL HIGH (ref 80.0–100.0)
Platelets: 305 10*3/uL (ref 150–400)
RBC: 4.39 MIL/uL (ref 3.87–5.11)
RDW: 13.4 % (ref 11.5–15.5)
WBC: 15 10*3/uL — ABNORMAL HIGH (ref 4.0–10.5)
nRBC: 0 % (ref 0.0–0.2)

## 2020-07-31 LAB — TROPONIN I (HIGH SENSITIVITY)
Troponin I (High Sensitivity): 53 ng/L — ABNORMAL HIGH (ref <18)
Troponin I (High Sensitivity): 193 ng/L (ref <18)
Troponin I (High Sensitivity): 363 ng/L (ref <18)
Troponin I (High Sensitivity): 380 ng/L (ref <18)
Troponin I (High Sensitivity): 279 ng/L (ref <18)

## 2020-07-31 LAB — RESP PANEL BY RT-PCR (FLU A&B, COVID) ARPGX2
Influenza A by PCR: NEGATIVE
Influenza B by PCR: NEGATIVE
SARS Coronavirus 2 by RT PCR: NEGATIVE

## 2020-07-31 LAB — BASIC METABOLIC PANEL
Anion gap: 10 (ref 5–15)
BUN: 17 mg/dL (ref 8–23)
CO2: 24 mmol/L (ref 22–32)
Calcium: 8.2 mg/dL — ABNORMAL LOW (ref 8.9–10.3)
Chloride: 108 mmol/L (ref 98–111)
Creatinine, Ser: 0.85 mg/dL (ref 0.44–1.00)
GFR, Estimated: >60 mL/min (ref >60)
Glucose, Bld: 90 mg/dL (ref 70–99)
Potassium: 3.1 mmol/L — ABNORMAL LOW (ref 3.5–5.1)
Sodium: 142 mmol/L (ref 135–145)

## 2020-07-31 LAB — TSH: TSH: 0.45 u[IU]/mL (ref 0.350–4.500)

## 2020-07-31 LAB — PROTIME-INR
INR: 1.6 — ABNORMAL HIGH (ref 0.8–1.2)
Prothrombin Time: 18.4 seconds — ABNORMAL HIGH (ref 11.4–15.2)

## 2020-07-31 MED ORDER — POTASSIUM CHLORIDE 20 MEQ PO PACK
40.0000 meq | PACK | Freq: Once | ORAL | Status: AC
  Administered 2020-07-31: 40 meq via ORAL
  Filled 2020-07-31: qty 2

## 2020-07-31 MED ORDER — ZINC GLUCONATE 50 MG PO TABS: 50.0000 mg | ORAL_TABLET | Freq: Every day | ORAL | Status: DC

## 2020-07-31 MED ORDER — SODIUM CHLORIDE 0.9 % IV BOLUS
500.0000 mL | Freq: Once | INTRAVENOUS | Status: AC
  Administered 2020-07-31: 500 mL via INTRAVENOUS

## 2020-07-31 MED ORDER — NITROGLYCERIN 0.4 MG SL SUBL
0.4000 mg | SUBLINGUAL_TABLET | SUBLINGUAL | Status: DC | PRN
  Administered 2020-07-31: 0.4 mg via SUBLINGUAL
  Filled 2020-07-31: qty 1

## 2020-07-31 MED ORDER — APIXABAN 5 MG PO TABS
5.0000 mg | ORAL_TABLET | Freq: Two times a day (BID) | ORAL | Status: DC
  Administered 2020-07-31 – 2020-08-01 (×2): 5 mg via ORAL
  Filled 2020-07-31 (×2): qty 1

## 2020-07-31 MED ORDER — POTASSIUM CHLORIDE CRYS ER 20 MEQ PO TBCR: 40.0000 meq | EXTENDED_RELEASE_TABLET | Freq: Once | ORAL | Status: DC

## 2020-07-31 MED ORDER — SODIUM CHLORIDE 0.9% FLUSH
3.0000 mL | Freq: Two times a day (BID) | INTRAVENOUS | Status: DC
  Administered 2020-07-31: 3 mL via INTRAVENOUS

## 2020-07-31 MED ORDER — VITAMIN B-12 1000 MCG PO TABS
2500.0000 ug | ORAL_TABLET | Freq: Every day | ORAL | Status: DC
  Administered 2020-08-01: 2500 ug via ORAL
  Filled 2020-07-31: qty 3

## 2020-07-31 MED ORDER — DILTIAZEM HCL-DEXTROSE 125-5 MG/125ML-% IV SOLN (PREMIX)
5.0000 mg/h | INTRAVENOUS | Status: DC
  Administered 2020-07-31: 5 mg/h via INTRAVENOUS
  Filled 2020-07-31: qty 125

## 2020-07-31 MED ORDER — PANTOPRAZOLE SODIUM 40 MG PO TBEC
40.0000 mg | DELAYED_RELEASE_TABLET | Freq: Every day | ORAL | Status: DC
  Administered 2020-08-01: 40 mg via ORAL
  Filled 2020-07-31: qty 1

## 2020-07-31 MED ORDER — B-12 2500 MCG PO TABS: 2500.0000 ug | ORAL_TABLET | Freq: Every day | ORAL | Status: DC

## 2020-07-31 MED ORDER — ETOMIDATE 2 MG/ML IV SOLN
8.0000 mg | Freq: Once | INTRAVENOUS | Status: DC
  Filled 2020-07-31: qty 10

## 2020-07-31 MED ORDER — ZOLPIDEM TARTRATE 5 MG PO TABS
5.0000 mg | ORAL_TABLET | Freq: Every evening | ORAL | Status: DC | PRN
  Administered 2020-07-31: 5 mg via ORAL
  Filled 2020-07-31: qty 1

## 2020-07-31 MED ORDER — VITAMIN D 25 MCG (1000 UNIT) PO TABS
1000.0000 [IU] | ORAL_TABLET | Freq: Every day | ORAL | Status: DC
  Administered 2020-08-01: 1000 [IU] via ORAL
  Filled 2020-07-31: qty 1

## 2020-07-31 MED ORDER — ATORVASTATIN CALCIUM 40 MG PO TABS
40.0000 mg | ORAL_TABLET | Freq: Every day | ORAL | Status: DC
  Administered 2020-07-31: 40 mg via ORAL
  Filled 2020-07-31: qty 1

## 2020-07-31 MED ORDER — ETOMIDATE 2 MG/ML IV SOLN
INTRAVENOUS | Status: AC | PRN
  Administered 2020-07-31: 8 mg via INTRAVENOUS

## 2020-07-31 MED ORDER — NITROGLYCERIN 0.4 MG SL SUBL: 0.4000 mg | SUBLINGUAL_TABLET | SUBLINGUAL | Status: DC | PRN

## 2020-07-31 MED ORDER — ZINC SULFATE 220 (50 ZN) MG PO CAPS
220.0000 mg | ORAL_CAPSULE | Freq: Every day | ORAL | Status: DC
  Administered 2020-08-01: 220 mg via ORAL
  Filled 2020-07-31: qty 1

## 2020-07-31 MED ORDER — TRAZODONE HCL 50 MG PO TABS: 25.0000 mg | ORAL_TABLET | Freq: Every evening | ORAL | Status: DC | PRN

## 2020-07-31 MED ORDER — ACETAMINOPHEN 325 MG PO TABS: 650.0000 mg | ORAL_TABLET | ORAL | Status: DC | PRN

## 2020-07-31 MED ORDER — ALPRAZOLAM 0.25 MG PO TABS
0.2500 mg | ORAL_TABLET | Freq: Two times a day (BID) | ORAL | Status: DC | PRN
  Administered 2020-07-31: 0.25 mg via ORAL
  Filled 2020-07-31: qty 1

## 2020-07-31 MED ORDER — SODIUM CHLORIDE 0.9 % IV SOLN: 250.0000 mL | INTRAVENOUS | Status: DC | PRN

## 2020-07-31 MED ORDER — SODIUM CHLORIDE 0.9% FLUSH: 3.0000 mL | INTRAVENOUS | Status: DC | PRN

## 2020-07-31 MED ORDER — ASCORBIC ACID 500 MG PO TABS
500.0000 mg | ORAL_TABLET | Freq: Every day | ORAL | Status: DC
  Administered 2020-08-01: 500 mg via ORAL
  Filled 2020-07-31: qty 1

## 2020-07-31 MED ORDER — ONDANSETRON HCL 4 MG/2ML IJ SOLN: 4.0000 mg | Freq: Four times a day (QID) | INTRAMUSCULAR | Status: DC | PRN

## 2020-07-31 NOTE — ED Provider Notes (Signed)
Emergency Department Provider Note   I have reviewed the triage vital signs and the nursing notes.   HISTORY  Chief Complaint Chest Pain (Afib)   HPI Brittany Kirk is a 76 y.o. female with past medical history of CAD and A. fib on Eliquis presents to the emergency department with discomfort in her chest with nausea and some shortness of breath.  Patient states that the symptoms began at 3 AM and woke her from sleep.  She states initially it felt like it was her A. fib where she was having more heart palpitations but was able to go back to sleep.  She awoke at 7 AM with substernal chest pain and pressure radiating to her left shoulder with some nausea.  She took her heart rate and found it to be in the 130s and so presented to the emergency department.  She arrived by EMS and was given 324 mg of aspirin in route.  He notes a strong family history of ACS.  She has been compliant with her medications including Eliquis.  Her last heart cath was in 2019 showing stenosis at several locations but ultimately aggressive medical therapy and blood pressure control were recommended without stenting.   Past Medical History:  Diagnosis Date  . Acid reflux   . Allergic rhinitis   . Anxiety   . Asymptomatic bilateral carotid artery stenosis    40-59% (02/2018)  . Atrial fibrillation (HCC) 07/2019  . Breast cancer (HCC) 2015   Right Breast Cancer  . CAD (coronary artery disease)   . Depression   . Emphysema    no longer uses inhalers  . Gallstones    nausea, pain upper right abdomen  . GERD (gastroesophageal reflux disease)   . H/O hiatal hernia   . Hiatal hernia   . History of skin cancer   . Hyperlipidemia   . Hypertension   . Multinodular goiter (nontoxic)   . Scoliosis   . Skin cancer   . Tobacco user   . Wears dentures    top    Patient Active Problem List   Diagnosis Date Noted  . Atrial fibrillation with rapid ventricular response (HCC) 07/31/2020  . Upper respiratory  tract infection 06/09/2020  . Atrial fibrillation with RVR (HCC) 07/15/2019  . Multinodular goiter (nontoxic)   . Asymptomatic bilateral carotid artery stenosis   . History of depression 02/14/2018  . History of breast cancer 02/14/2018  . Accelerated hypertension 02/14/2018  . Unstable angina (HCC) 10/11/2017  . Caloric malnutrition (HCC) 08/16/2017  . Weight loss, unintentional 03/12/2017  . GERD (gastroesophageal reflux disease) 08/10/2016  . Cough 08/10/2016  . Acute bronchitis 07/12/2016  . Bilateral carotid artery disease (HCC) 02/14/2016  . Chest pain with moderate risk for cardiac etiology 01/13/2016  . Osteoporosis 02/22/2015  . Stage 2 chronic kidney disease due to benign hypertension 08/16/2014  . Malignant neoplasm of upper-outer quadrant of right breast in female, estrogen receptor positive (HCC) 03/22/2014  . Dizziness and giddiness 11/27/2012  . Essential tremor 11/27/2012  . Numbness 11/27/2012  . TOBACCO USER 10/04/2009  . CLOSED FRACTURE OF ONE RIB 07/12/2009  . Dyslipidemia, goal LDL below 70 09/28/2008  . CAD in native artery 09/28/2008  . Rhinitis 09/09/2008  . COPD (chronic obstructive pulmonary disease) (HCC) 09/07/2008  . Essential hypertension 09/06/2008  . SKIN CANCER, HX OF 09/06/2008    Past Surgical History:  Procedure Laterality Date  . BREAST LUMPECTOMY Right 2015  . CHOLECYSTECTOMY  07/08/2012   Procedure:  LAPAROSCOPIC CHOLECYSTECTOMY WITH INTRAOPERATIVE CHOLANGIOGRAM;  Surgeon: Atilano Ina, MD,FACS;  Location[facility];  Service: General;  Laterality: N/A;  Laparoscopic Cholecystectomy with Intraoperative Cholangiogram  . COLONOSCOPY    . ESOPHAGOSCOPY N/A 05/29/2017   Procedure: ESOPHAGOSCOPY;  Surgeon: Vida Rigger, MD;  Location: [facility] ENDOSCOPY;  Service: Endoscopy;  Laterality: N/A;  botox injection  . HAND SURGERY Left    wrist  . LEFT HEART CATH AND CORONARY ANGIOGRAPHY N/A 10/11/2017   Procedure: LEFT HEART CATH AND CORONARY ANGIOGRAPHY;   Surgeon: [place], Peter M, MD;  [facility]: [facility] INVASIVE CV LAB;  Service: Cardiovascular;  Laterality: N/A;  . TONSILECTOMY, ADENOIDECTOMY, BILATERAL MYRINGOTOMY AND TUBES    . TUBAL LIGATION      Allergies Penicillins, Cefaclor, Latex, Metronidazole, Naproxen, Nsaids, Septra [bactrim], Sulfonamide derivatives, and Tape  Family History  Problem Relation Age of Onset  . Arthritis Mother   . Pancreatic cancer Mother   . Heart disease Other        5 brothers and 2 sister  . Diverticulitis Sister   . Heart disease Sister   . Aplastic anemia Other     Social History Social History   Tobacco Use  . Smoking status: Former Smoker    Packs/day: 0.50    Years: 46.00    Pack years: 23.00    Types: Cigarettes    Quit date: 02/13/2011    Years since quitting: 9.4  . Smokeless tobacco: Never Used  Vaping Use  . Vaping Use: Never used  Substance Use Topics  . Alcohol use: No  . Drug use: No    Review of Systems  Constitutional: No fever/chills Eyes: No visual changes. ENT: No sore throat. Cardiovascular: Positive chest pain and heart palpitations.  Respiratory: Denies shortness of breath. Gastrointestinal: No abdominal pain.  No nausea, no vomiting.  No diarrhea.  No constipation. Genitourinary: Negative for dysuria. Musculoskeletal: Negative for back pain. Skin: Negative for rash. Neurological: Negative for headaches, focal weakness or numbness.  10-point ROS otherwise negative.  ____________________________________________   PHYSICAL EXAM:  VITAL SIGNS: ED Triage Vitals  Enc Vitals Group     BP 07/31/20 0958 (!) 115/103     Pulse Rate 07/31/20 0958 78     Resp 07/31/20 0958 18     Temp 07/31/20 0958 97.6 F (36.4 C)     Temp Source 07/31/20 0958 Oral     SpO2 07/31/20 0958 98 %     Weight 07/31/20 1035 120 lb (54.4 kg)     Height 07/31/20 1035 4\' 11"  (1.499 m)   Constitutional: Alert and oriented. Well appearing and in no acute distress. Eyes: Conjunctivae are  normal.  Head: Atraumatic. Nose: No congestion/rhinnorhea. Mouth/Throat: Mucous membranes are moist.  Neck: No stridor.   Cardiovascular: A-fib with RVR. Good peripheral circulation. Grossly normal heart sounds.   Respiratory: Normal respiratory effort.  No retractions. Lungs CTAB. Gastrointestinal: Soft and nontender. No distention.  Musculoskeletal: No lower extremity tenderness nor edema. No gross deformities of extremities. Neurologic:  Normal speech and language. No gross focal neurologic deficits are appreciated.  Skin:  Skin is warm, dry and intact. No rash noted.  ____________________________________________   LABS (all labs ordered are listed, but only abnormal results are displayed)  Labs Reviewed  BASIC METABOLIC PANEL - Abnormal; Notable for the following components:      Result Value   Potassium 3.1 (*)    Calcium 8.2 (*)    All other components within normal limits  CBC - Abnormal; Notable  for the following components:   WBC 15.0 (*)    MCV 102.5 (*)    All other components within normal limits  PROTIME-INR - Abnormal; Notable for the following components:   Prothrombin Time 18.4 (*)    INR 1.6 (*)    All other components within normal limits  COMPREHENSIVE METABOLIC PANEL - Abnormal; Notable for the following components:   Calcium 7.8 (*)    Total Protein 4.7 (*)    Albumin 2.4 (*)    All other components within normal limits  TROPONIN I (HIGH SENSITIVITY) - Abnormal; Notable for the following components:   Troponin I (High Sensitivity) 53 (*)    All other components within normal limits  TROPONIN I (HIGH SENSITIVITY) - Abnormal; Notable for the following components:   Troponin I (High Sensitivity) 193 (*)    All other components within normal limits  TROPONIN I (HIGH SENSITIVITY) - Abnormal; Notable for the following components:   Troponin I (High Sensitivity) 363 (*)    All other components within normal limits  TROPONIN I (HIGH SENSITIVITY) - Abnormal;  Notable for the following components:   Troponin I (High Sensitivity) 380 (*)    All other components within normal limits  TROPONIN I (HIGH SENSITIVITY) - Abnormal; Notable for the following components:   Troponin I (High Sensitivity) 279 (*)    All other components within normal limits  RESP PANEL BY RT-PCR (FLU A&B, COVID) ARPGX2  TSH   ____________________________________________  EKG  Arrival EKG:   EKG Interpretation  Date/Time:  Sunday July 31 2020 10:21:12 EST Ventricular Rate:  155 PR Interval:    QRS Duration: 66 QT Interval:  244 QTC Calculation: 392 R Axis:   -6 Text Interpretation: Atrial fibrillation with rapid ventricular response Anterior infarct , age undetermined Abnormal ECG No STEMI Confirmed by Alona Bene [phone]) on 07/31/2020 10:26:58 AM      Repeat EKG on Diltiazem:   EKG Interpretation  Date/Time:  Sunday July 31 2020 12:04:10 EST Ventricular Rate:  108 PR Interval:    QRS Duration: 90 QT Interval:  344 QTC Calculation: 462 R Axis:   -6 Text Interpretation: Atrial fibrillation Minimal ST depression, lateral leads Confirmed by Alona Bene [phone]) on 07/31/2020 12:08:21 PM       ____________________________________________  RADIOLOGY  DG Chest 2 View  Result Date: 07/31/2020 CLINICAL DATA:  Acute chest pain and shortness of breath today. EXAM: CHEST - 2 VIEW COMPARISON:  03/12/2020 and prior exams FINDINGS: The cardiomediastinal silhouette is unremarkable. There is no evidence of focal airspace disease, pulmonary edema, suspicious pulmonary nodule/mass, pleural effusion, or pneumothorax. No acute bony abnormalities are identified. Surgical clips in the RIGHT axilla are again noted. IMPRESSION: No active cardiopulmonary disease. Electronically Signed   By: Harmon Pier M.D.   On: 07/31/2020 10:40    ____________________________________________   PROCEDURES  Procedure(s) performed:   .Critical Care Performed by: Maia Plan,  MD Authorized by: Maia Plan, MD   Critical care provider statement:    Critical care time (minutes):  45   Critical care time was exclusive of:  Separately billable procedures and treating other patients and teaching time   Critical care was necessary to treat or prevent imminent or life-threatening deterioration of the following conditions:  Circulatory failure   Critical care was time spent personally by me on the following activities:  Discussions with consultants, evaluation of patient's response to treatment, examination of patient, ordering and performing treatments and interventions, ordering and review of  laboratory studies, ordering and review of radiographic studies, pulse oximetry, re-evaluation of patient's condition, obtaining history from patient or surrogate, review of old charts, blood draw for specimens and development of treatment plan with patient or surrogate   I assumed direction of critical care for this patient from another provider in my specialty: no     Care discussed with: admitting provider       ____________________________________________   INITIAL IMPRESSION / ASSESSMENT AND PLAN / ED COURSE  Pertinent labs & imaging results that were available during my care of the patient were reviewed by me and considered in my medical decision making (see chart for details).   Patient presents to the emergency department for evaluation of chest discomfort which by patient report feels very different from her typical A. fib sensation.  Her EKG shows A. fib with RVR.  There is some nonspecific ST changes which are likely rate related.  Patient is compliant with her Eliquis.  Will obtain labs and opt for rate control currently with plan to discuss with cardiology.  Unclear if the A. fib with RVR is the primary reason for her symptoms or if the elevated rate is uncovering some worsening coronary narrowing and causing some anginal pain. Will start with Diltiazem with no known  history of CHF and give Nitro SL currently.   11:50 AM  Spoke with Dr. Rennis Golden with Cardiology regarding the case. The Cardiology team will come down and consult. Differential at this time is symptoms related to A-fib with RVR vs unstable angina.   12:09 PM  Repeat EKG with rate slower. Have some minimal ST depressions laterally. No ST elevation noted.   Discussed patient's case with Cardiology to request admission. Patient and family (if present) updated with plan. Care transferred to Cardiology service.  I reviewed all nursing notes, vitals, pertinent old records, EKGs, labs, imaging (as available).   Cardiology evaluated.  They plan to admit with the chest pain component of the patient's complaint.  They are requesting ED cardioversion to sinus rhythm.  Case signed out to Dr. Maudie Mercury who will complete the procedure. ____________________________________________  FINAL CLINICAL IMPRESSION(S) / ED DIAGNOSES  Final diagnoses:  Atrial fibrillation with RVR (HCC)  Precordial chest pain     MEDICATIONS GIVEN DURING THIS VISIT:  Medications  diltiazem (CARDIZEM) 125 mg in dextrose 5% 125 mL (1 mg/mL) infusion (0 mg/hr Intravenous Stopped 07/31/20 1708)  nitroGLYCERIN (NITROSTAT) SL tablet 0.4 mg (has no administration in time range)  acetaminophen (TYLENOL) tablet 650 mg (has no administration in time range)  ondansetron (ZOFRAN) injection 4 mg (has no administration in time range)  zolpidem (AMBIEN) tablet 5 mg (5 mg Oral Given 07/31/20 2003)  sodium chloride flush (NS) 0.9 % injection 3 mL (3 mLs Intravenous Given 07/31/20 2005)  sodium chloride flush (NS) 0.9 % injection 3 mL (has no administration in time range)  0.9 %  sodium chloride infusion (has no administration in time range)  ALPRAZolam (XANAX) tablet 0.25 mg (0.25 mg Oral Given 07/31/20 2004)  etomidate (AMIDATE) injection 8 mg (8 mg Intravenous Not Given 07/31/20 1733)  potassium chloride SA (KLOR-CON) CR tablet 40 mEq (40 mEq  Oral Not Given 07/31/20 1752)  traZODone (DESYREL) tablet 25-50 mg (has no administration in time range)  ascorbic acid (VITAMIN C) tablet 500 mg (has no administration in time range)  atorvastatin (LIPITOR) tablet 40 mg (40 mg Oral Given 07/31/20 2003)  cholecalciferol (VITAMIN D3) tablet 1,000 Units (has no administration in time range)  apixaban (ELIQUIS) tablet 5 mg (5 mg Oral Given 07/31/20 2004)  pantoprazole (PROTONIX) EC tablet 40 mg (has no administration in time range)  vitamin B-12 (CYANOCOBALAMIN) tablet 2,500 mcg (has no administration in time range)  zinc sulfate capsule 220 mg (has no administration in time range)  sodium chloride 0.9 % bolus 500 mL (0 mLs Intravenous Stopped 07/31/20 1137)  potassium chloride (KLOR-CON) packet 40 mEq (40 mEq Oral Given 07/31/20 1739)  etomidate (AMIDATE) injection (8 mg Intravenous Given 07/31/20 1718)    Note:  This document was prepared using Dragon voice recognition software and may include unintentional dictation errors.  Alona Bene, MD, [facility] Emergency Medicine    Jess Sulak, Arlyss Repress, MD 08/01/20 [phone]

## 2020-07-31 NOTE — Progress Notes (Signed)
  Echocardiogram 2D Echocardiogram has been performed.  Brittany Kirk F 07/31/2020, 6:18 PM

## 2020-07-31 NOTE — Consult Note (Addendum)
Cardiology Consultation:   Patient ID: Brittany Kirk; [id number]; 1946/06/05   Admit date: 07/31/2020 Date of Consult: 07/31/2020  Primary Care Provider: Susy Frizzle, MD Primary Cardiologist: Brittany Burow, MD 03/15/2020 Primary Electrophysiologist:  None   Patient Profile:   Brittany Kirk is a 75 y.o. female with a hx of asymptomatic carotid dz, HTN, HLD, tob use, GERD, multinodular goiter, skin CA, breast CA,  who is being seen today for the evaluation of chest pain and atrial fib at the request of Dr Laverta Baltimore.  History of Present Illness:   [name] was in her usual state of health until she suddenly went into atrial fibrillation this morning.  She felt her heart suddenly speed up and beat very irregularly.  It was beating very hard.  The rapid heartbeat gave her chest pain that reached a 9/10.  It was a squeezing pain in the center of her chest.  She had some shortness of breath with this as well.  She felt very weak.  She knew she needed to do something about it, so she called EMS.  She was given aspirin 81 mg x 4 and a sublingual nitroglycerin.  She also got a normal saline bolus.  When she first went out of rhythm, she took her blood pressure.  Her blood pressure is very elevated, and her heart rate was greater than 150.  During transport, EMS noted her heart rate to be between 80 and 130s.  She was started on IV diltiazem which is currently running at 5 mg/h.  Her heart rate and blood pressure improved.  Although her heart rate and blood pressure normalized, she is still in atrial fibrillation.  She is still aware of the irregular rhythm.  Her chest pain has improved, and is down to a 1/10.  She still feels weak, but does not feel short of breath.  The last time she thought she was out of rhythm, was last October when she came to the emergency room.  They did not witness any atrial fibrillation, so only PVCs.  She has not had chest pain in months, does not actually  remember the last episode.  She has otherwise been doing pretty well.  However, she notes that whenever she sits with her feet down, she will get swelling and a purplish discoloration.  She will also get some tingling.  As long as her legs are up, none of these things happen.   Past Medical History:  Diagnosis Date  . Acid reflux   . Allergic rhinitis   . Anxiety   . Asymptomatic bilateral carotid artery stenosis    40-59% (02/2018)  . Atrial fibrillation ([place]) 07/2019  . Breast cancer (Brittany Kirk) 2015   Right Breast Cancer  . CAD (coronary artery disease)   . Depression   . Emphysema    no longer uses inhalers  . Gallstones    nausea, pain upper right abdomen  . GERD (gastroesophageal reflux disease)   . H/O hiatal hernia   . Hiatal hernia   . History of skin cancer   . Hyperlipidemia   . Hypertension   . Multinodular goiter (nontoxic)   . Scoliosis   . Skin cancer   . Tobacco user   . Wears dentures    top    Past Surgical History:  Procedure Laterality Date  . BREAST LUMPECTOMY Right 2015  . CHOLECYSTECTOMY  07/08/2012   Procedure: LAPAROSCOPIC CHOLECYSTECTOMY WITH INTRAOPERATIVE CHOLANGIOGRAM;  Surgeon: Brittany Curry, MD,FACS;  Location: [facility];  Service: General;  Laterality: N/A;  Laparoscopic Cholecystectomy with Intraoperative Cholangiogram  . COLONOSCOPY    . ESOPHAGOSCOPY N/A 05/29/2017   Procedure: ESOPHAGOSCOPY;  Surgeon: Brittany Essex, MD;  Location: [facility];  Service: Endoscopy;  Laterality: N/A;  botox injection  . HAND SURGERY Left    wrist  . LEFT HEART CATH AND CORONARY ANGIOGRAPHY N/A 10/11/2017   Procedure: LEFT HEART CATH AND CORONARY ANGIOGRAPHY;  Surgeon: Martinique, Peter M, MD;  Location: [facility];  Service: Cardiovascular;  Laterality: N/A;  . TONSILECTOMY, ADENOIDECTOMY, BILATERAL MYRINGOTOMY AND TUBES    . TUBAL LIGATION       Prior to Admission medications   Medication Sig Start Date End Date Taking? Authorizing Provider  ascorbic acid  (VITAMIN C) 500 MG tablet Take 500 mg by mouth daily.   Yes [provider]  atorvastatin (LIPITOR) 40 MG tablet TAKE 1 TABLET BY MOUTH EVERYDAY AT BEDTIME Patient taking differently: Take 40 mg by mouth at bedtime. 06/13/20  Yes Brittany Frizzle, MD  cholecalciferol (VITAMIN D3) 25 MCG (1000 UNIT) tablet Take 1,000 Units by mouth daily.   Yes [provider]  Cyanocobalamin (B-12) 2500 MCG TABS Take 2,500 mcg by mouth daily.   Yes [provider]  ELIQUIS 5 MG TABS tablet TAKE 1 TABLET BY MOUTH TWICE A DAY Patient taking differently: Take 5 mg by mouth 2 (two) times daily. 04/08/20  Yes Brittany Harp, MD  omeprazole (PRILOSEC) 20 MG capsule Take 20 mg by mouth daily. 07/06/19  Yes [provider]  zinc gluconate 50 MG tablet Take 50 mg by mouth daily.   Yes [provider]  diclofenac Sodium (VOLTAREN) 1 % GEL Apply 4 g topically 4 (four) times daily as needed. Patient not taking: Reported on 07/31/2020 06/01/20   Hilts, Legrand Como, MD  guaiFENesin (MUCINEX) 600 MG 12 hr tablet Take 1 tablet (600 mg total) by mouth 2 (two) times daily as needed for cough or to loosen phlegm. Patient not taking: Reported on 07/31/2020 06/09/20   Brittany Bear, NP  HYDROcodone-acetaminophen (NORCO) 5-325 MG tablet Take 1 tablet by mouth every 6 (six) hours as needed for moderate pain. Patient not taking: Reported on 07/31/2020 05/26/20   Brittany Frizzle, MD  metoprolol succinate (TOPROL XL) 25 MG 24 hr tablet Take 0.5 tablets (12.5 mg total) by mouth daily. Patient not taking: No sig reported 03/15/20   Brittany Harp, MD  predniSONE (DELTASONE) 10 MG tablet Take as directed for 12 days.  Daily dose 6,6,5,5,4,4,3,3,2,2,1,1. Patient not taking: No sig reported 07/20/20   Hilts, Legrand Como, MD  traMADol (ULTRAM) 50 MG tablet Take 1 tablet (50 mg total) by mouth every 6 (six) hours as needed. Patient not taking: No sig reported 06/02/20   Hilts, Legrand Como, MD  traZODone  (DESYREL) 50 MG tablet TAKE 1/2 TO 1 TABLET BY MOUTH AT BEDTIME AS NEEDED FOR SLEEP Patient not taking: No sig reported 09/09/19   Brittany Frizzle, MD    Inpatient Medications: Scheduled Meds: . potassium chloride  40 mEq Oral Once   Continuous Infusions: . diltiazem (CARDIZEM) infusion 5 mg/hr (07/31/20 1129)   PRN Meds: nitroGLYCERIN  Allergies:    Allergies  Allergen Reactions  . Penicillins Anaphylaxis, Itching, Swelling and Rash    Has patient had a PCN reaction causing immediate rash, facial/tongue/throat swelling, SOB or lightheadedness with hypotension: Yes Has patient had a PCN reaction causing severe rash involving mucus membranes or skin necrosis: No Has  patient had a PCN reaction that required hospitalization: Yes Has patient had a PCN reaction occurring within the last 10 years: No If all of the above answers are "NO", then may proceed with Cephalosporin use.   . Cefaclor Itching, Swelling and Rash  . Latex Itching and Rash  . Metronidazole Itching, Swelling and Rash  . Naproxen Itching, Swelling and Rash  . Nsaids Itching, Swelling and Rash    Ibuprofen IS tolerated  . Septra [Bactrim] Itching, Swelling and Rash  . Sulfonamide Derivatives Itching, Swelling and Rash  . Tape Itching and Rash    Social History:   Social History   Socioeconomic History  . Marital status: Married    Spouse name: Not on file  . Number of children: 1  . Years of education: Not on file  . Highest education level: Not on file  Occupational History  . Occupation: retired Clinical cytogeneticist: RETIRED  Tobacco Use  . Smoking status: Former Smoker    Packs/day: 0.50    Years: 46.00    Pack years: 23.00    Types: Cigarettes    Quit date: 02/13/2011    Years since quitting: 9.4  . Smokeless tobacco: Never Used  Vaping Use  . Vaping Use: Never used  Substance and Sexual Activity  . Alcohol use: No  . Drug use: No  . Sexual activity: Not on file  Other Topics  Concern  . Not on file  Social History Narrative  . Not on file   Social Determinants of Health   Financial Resource Strain: Low Risk   . Difficulty of Paying Living Expenses: Not hard at all  Food Insecurity: Not on file  Transportation Needs: Not on file  Physical Activity: Not on file  Stress: Not on file  Social Connections: Not on file  Intimate Partner Violence: Not on file    Family History:   Family History  Problem Relation Age of Onset  . Arthritis Mother   . Pancreatic cancer Mother   . Heart disease Other        5 brothers and 2 sister  . Diverticulitis Sister   . Heart disease Sister   . Aplastic anemia Other    Family Status:  Family Status  Relation Name Status  . Mother  Deceased  . Father  Deceased  . Other  (Not Specified)  . Sister  (Not Specified)  . Other grandson Alive    ROS:  Please see the history of present illness.  All other ROS reviewed and negative.     Physical Exam/Data:   Vitals:   07/31/20 1400 07/31/20 1415 07/31/20 1445 07/31/20 1500  BP: (!) 116/58 (!) 122/59 118/62 (!) 143/57  Pulse: 81 69 62 81  Resp: 15 18 19 19   Temp:      TempSrc:      SpO2: 99% 100% 99% 99%  Weight:      Height:        Intake/Output Summary (Last 24 hours) at 07/31/2020 1523 Last data filed at 07/31/2020 1137 Gross per 24 hour  Intake 500 ml  Output --  Net 500 ml    Last 3 Weights 07/31/2020 05/26/2020 05/16/2020  Weight (lbs) 120 lb 118 lb 116 lb  Weight (kg) 54.432 kg 53.524 kg 52.617 kg     Body mass index is 24.24 kg/Kirk.   General:  Well nourished, well developed, female in no acute distress HEENT: normal Lymph: no adenopathy Neck: JVD - not  elevated Endocrine:  No thryomegaly Vascular: 3-4/6 R>L carotid bruits; 4/4 extremity pulses 2+  Cardiac:  normal S1, S2; RRR; no murmur Lungs:  clear bilaterally, no wheezing, rhonchi or rales  Abd: soft, nontender, no hepatomegaly  Ext: no edema Musculoskeletal:  No deformities, BUE and  BLE strength normal and equal Skin: warm and dry  Neuro:  CNs 2-12 intact, no focal abnormalities noted Psych:  Normal affect   EKG:  The EKG was personally reviewed and demonstrates: Atrial fibrillation, heart rate 108, diffuse ST flattening Telemetry:  Telemetry was personally reviewed and demonstrates: Atrial fibrillation, heart rate now controlled   CV studies:   CATH: 10/11/2017  Ost LAD lesion is 30% stenosed.  Prox LAD lesion is 40% stenosed.  Ost Ramus lesion is 85% stenosed.  Prox RCA to Mid RCA lesion is 35% stenosed.  The left ventricular systolic function is normal.  LV end diastolic pressure is normal.  The left ventricular ejection fraction is 55-65% by visual estimate.   1. Single vessel obstructive CAD involving the ostium of the ramus intermediate branch. 2. Normal LV function 3. Normal LVEDP  Plan: Cardiac cath is unchanged from prior study in 2010. The Ramus branch was untreated at that time due to concern about shifting plaque into the LAD or LCx with PCI. The patient is currently on no antianginal therapy and is severely hypertensive in the lab. I would recommend aggressive medical therapy and BP control. Will add Coreg and long acting nitrate. If she continues to have refractory angina despite optimal medical therapy and BP control, PCI of the ramus could be considered.  Diagnostic Dominance: Right      Laboratory Data:   Chemistry Recent Labs  Lab 07/31/20 1010  NA 142  K 3.1*  CL 108  CO2 24  GLUCOSE 90  BUN 17  CREATININE 0.85  CALCIUM 8.2*  GFRNONAA >60  ANIONGAP 10    Lab Results  Component Value Date   ALT 8 09/29/2019   AST 13 09/29/2019   ALKPHOS 92 07/02/2018   BILITOT 0.5 09/29/2019   Hematology Recent Labs  Lab 07/31/20 1010  WBC 15.0*  RBC 4.39  HGB 14.3  HCT 45.0  MCV 102.5*  MCH 32.6  MCHC 31.8  RDW 13.4  PLT 305   Cardiac Enzymes High Sensitivity Troponin:   Recent Labs  Lab 07/31/20 1010  07/31/20 1156  TROPONINIHS 53* 193*      BNPNo results for input(s): BNP, PROBNP in the last 168 hours.  DDimer No results for input(s): DDIMER in the last 168 hours. TSH:  Lab Results  Component Value Date   TSH 0.65 09/29/2019   Lipids: Lab Results  Component Value Date   CHOL 133 07/15/2019   HDL 59 07/15/2019   LDLCALC 64 07/15/2019   TRIG 48 07/15/2019   CHOLHDL 2.3 07/15/2019   HgbA1c: Lab Results  Component Value Date   HGBA1C 5.3 07/15/2019   Magnesium:  Magnesium  Date Value Ref Range Status  07/15/2019 2.2 1.7 - 2.4 mg/dL Final    Comment:    Performed at [facility], [place] [address] [place], [place] [address]     Radiology/Studies:  DG Chest 2 View  Result Date: 07/31/2020 CLINICAL DATA:  Acute chest pain and shortness of breath today. EXAM: CHEST - 2 VIEW COMPARISON:  03/12/2020 and prior exams FINDINGS: The cardiomediastinal silhouette is unremarkable. There is no evidence of focal airspace disease, pulmonary edema, suspicious pulmonary nodule/mass, pleural effusion, or pneumothorax.  No acute bony abnormalities are identified. Surgical clips in the RIGHT axilla are again noted. IMPRESSION: No active cardiopulmonary disease. Electronically Signed   By: Margarette Canada Kirk.D.   On: 07/31/2020 10:40    Assessment and Plan:   1.  Atrial fibrillation, RVR -Her heart rate is controlled on IV Cardizem, but she is still symptomatic -She states clearly that she has not missed any doses of her Eliquis -She has been n.p.o. -Consider cardioversion in the ER to restore sinus rhythm -If her resting heart rate is normal on the Cardizem, go ahead and converted to p.o. -Her episodes do not occur very often, consider a loop recorder to determine if she is having atrial fib when she was not aware of it  2.  Chest pain: -Her troponin has some elevation, may need to continue to cycle -Her symptoms improved dramatically when her heart rate was better controlled -It is  entirely possible that this is demand ischemia from her small ramus intermedius that had an 85% blockage at cath in 2019 -At that time, it had not changed in 10 years, medical therapy recommended -She has no history of exertional chest pain, has not had any recent episodes -MD advise on medical therapy, adding Imdur versus inpatient evaluation  Principal Problem:   Atrial fibrillation with RVR ([place]) Active Problems:   Chest pain with moderate risk for cardiac etiology  For questions or updates, please contact [place] Please consult [email or web address] for contact info under Cardiology/STEMI.   Signed, Rosaria Ferries, PA-C  07/31/2020 3:23 PM   The patient was seen, examined and discussed with Rosaria Ferries, PA-C and I agree with the above.   A very pleasant 75 year old female with prior medical history of paroxysmal atrial fibrillation on chronic anticoagulation with Eliquis and no missed doses, usually she gets A. fib once every 3 months and it self terminated.  She woke up this morning with palpitations that progressed and eventually she developed chest pain when she called EMS.  She was started on Cardizem drip and her heart rate decreased from 150s to 70s and she is currently asymptomatic.  She remains on Cardizem drip.  The patient recently fell and broke her pelvic bone for which she is undergoing physical therapy and is currently walking with a walker, but with this activity she has not been having any episodes of chest pain. She has known coronary artery disease, she underwent cardiac catheterization in May 2019 that showed single-vessel obstructive CAD involving ostium of the ramus intermedius branch, this is unchanged from prior study in 2010 at the time there was concern about shifting plaque into the CAD or circumflex with PCI and medical therapy was decided.  She has not had any chest pains until today.  On physical exam she is not in acute distress, there are no JVDs, she has  irregular rate and rhythm and no murmur, her lungs are clear, her extremities are warm and have good peripheral pulses, there is no edema.  Her EKG currently shows atrial fibrillation with ventricular rates in 70s, no acute ST-T wave abnormalities.  Labs show creatinine 1.85, potassium 3.1, sodium 142, troponin 53 then increased to 193, hemoglobin 14.3, platelets 305.  Assessment and plan:  Atrial fibrillation with RVR Elevated troponin Chest pain CAD, with disease in the ostial ramus intermedius Hypokalemia  We will ask ER to cardiovert the patient in the ER as our endoscopy schedule is full tomorrow, we will admit the patient for further work-up including correcting her hypokalemia,  obtaining an echocardiogram as she has not had any in many years, at this point I am assuming that her elevated troponin is demand ischemia in the setting of A. fib with RVR especially since she has not had any chest pain in the last 2 years, but if her troponin continues to rise we might consider ischemic work-up.  Ena Dawley, MD 07/31/2020

## 2020-07-31 NOTE — ED Notes (Signed)
Pt had dizzy spell with increased CP, head, and neck pain following nitro administration. MD notified. Bolus ordered and infusing at this time. Cardizem held until bolus infusion and BP recheck

## 2020-07-31 NOTE — Sedation Documentation (Signed)
Synch on 200 joules, shocked

## 2020-07-31 NOTE — ED Triage Notes (Signed)
Pt to triage via [facility] from home.  Woke up this morning with chest pain, SOB, and nausea.  States nausea relieved.  AFIB 98-160 per EMS. After NS bolus HR 80-130s.   18g L upper arm.  ASA 324mg .

## 2020-07-31 NOTE — ED Provider Notes (Signed)
.Cardioversion  Date/Time: 07/31/2020 5:28 PM Performed by: Malvin Johns, MD Authorized by: Malvin Johns, MD   Consent:    Consent obtained:  Written   Consent given by:  Patient   Risks discussed:  Induced arrhythmia, cutaneous burn, death and pain   Alternatives discussed:  No treatment and rate-control medication Pre-procedure details:    Cardioversion basis:  Emergent   Rhythm:  Atrial fibrillation   Electrode placement:  Anterior-posterior Patient sedated: Yes. Refer to sedation procedure documentation for details of sedation.  Attempt one:    Cardioversion mode:  Synchronous   Waveform:  Biphasic   Shock (Joules):  200   Shock outcome:  No change in rhythm Attempt two:    Cardioversion mode:  Synchronous   Shock (Joules):  200   Shock outcome:  Conversion to normal sinus rhythm Post-procedure details:    Patient status:  Alert   Patient tolerance of procedure:  Tolerated well, no immediate complications .Sedation  Date/Time: 07/31/2020 5:29 PM Performed by: Malvin Johns, MD Authorized by: Malvin Johns, MD   Consent:    Consent obtained:  Written   Consent given by:  Patient   Risks discussed:  Vomiting, respiratory compromise necessitating ventilatory assistance and intubation and prolonged hypoxia resulting in organ damage   Alternatives discussed:  Analgesia without sedation Universal protocol:    Procedure explained and questions answered to patient or proxy's satisfaction: yes     Relevant documents present and verified: yes     Test results available: yes     Imaging studies available: yes     Required blood products, implants, devices, and special equipment available: yes     Immediately prior to procedure, a time out was called: yes     Patient identity confirmed:  Verbally with patient Indications:    Procedure performed:  Cardioversion Pre-sedation assessment:    Time since last food or drink:  8   ASA classification: class 2 - patient with  mild systemic disease     Mouth opening:  2 finger widths   Thyromental distance:  2 finger widths   Mallampati score:  II - soft palate, uvula, fauces visible   Neck mobility: normal     Pre-sedation assessments completed and reviewed: airway patency, cardiovascular function, hydration status, mental status, nausea/vomiting, pain level, respiratory function and temperature     Pre-sedation assessment completed:  07/31/2020 4:00 PM Immediate pre-procedure details:    Reassessment: Patient reassessed immediately prior to procedure     Reviewed: vital signs, relevant labs/tests and NPO status     Verified: bag valve mask available, emergency equipment available, intubation equipment available, IV patency confirmed, oxygen available and suction available   Procedure details (see MAR for exact dosages):    Preoxygenation:  Nasal cannula   Sedation:  Etomidate   Intended level of sedation: deep   Intra-procedure monitoring:  Blood pressure monitoring, cardiac monitor, continuous capnometry, continuous pulse oximetry, frequent LOC assessments and frequent vital sign checks   Intra-procedure events: none     Total Provider sedation time (minutes):  15 Post-procedure details:    Post-sedation assessment completed:  07/31/2020 5:31 PM   Attendance: Constant attendance by certified staff until patient recovered     Recovery: Patient returned to pre-procedure baseline     Post-sedation assessments completed and reviewed: airway patency, cardiovascular function, hydration status, mental status, nausea/vomiting, pain level and respiratory function     Patient is stable for discharge or admission: yes     Procedure completion:  Tolerated well, no immediate complications   Care taken over from Dr. Laverta Baltimore.  Pt awaiting admission to cardiology.  Cardiology requests ED cardioversion.  PT verifies that she has not missed any doses of her Eliquis.  Cardioversion successful after 2 attempts.   Malvin Johns,  MD 07/31/20 [phone]

## 2020-07-31 NOTE — ED Notes (Signed)
Service Resource called @ 1803-[address] Ordered. 

## 2020-07-31 NOTE — H&P (Addendum)
See Consult note from earlier today.  Plan: Cardiovert in the ER, Admit, continue to cycle ez. Decide on type of ischemic eval in am once all data reviewed.  Rosaria Ferries, PA-C 07/31/2020 3:39 PM    The patient was seen, examined and discussed with Rosaria Ferries, PA-C and I agree with the above.   A very pleasant 75 year old female with prior medical history of paroxysmal atrial fibrillation on chronic anticoagulation with Eliquis and no missed doses, usually she gets A. fib once every 3 months and it self terminated.  She woke up this morning with palpitations that progressed and eventually she developed chest pain when she called EMS.  She was started on Cardizem drip and her heart rate decreased from 150s to 70s and she is currently asymptomatic.  She remains on Cardizem drip.  The patient recently fell and broke her pelvic bone for which she is undergoing physical therapy and is currently walking with a walker, but with this activity she has not been having any episodes of chest pain. She has known coronary artery disease, she underwent cardiac catheterization in May 2019 that showed single-vessel obstructive CAD involving ostium of the ramus intermedius branch, this is unchanged from prior study in 2010 at the time there was concern about shifting plaque into the CAD or circumflex with PCI and medical therapy was decided.  She has not had any chest pains until today.  On physical exam she is not in acute distress, there are no JVDs, she has irregular rate and rhythm and no murmur, her lungs are clear, her extremities are warm and have good peripheral pulses, there is no edema.  Her EKG currently shows atrial fibrillation with ventricular rates in 70s, no acute ST-T wave abnormalities.  Labs show creatinine 1.85, potassium 3.1, sodium 142, troponin 53 then increased to 193, hemoglobin 14.3, platelets 305.  Assessment and plan:  Atrial fibrillation with RVR Elevated troponin Chest  pain CAD, with disease in the ostial ramus intermedius Hypokalemia  We will ask ER to cardiovert the patient in the ER as our endoscopy schedule is full tomorrow, we will admit the patient for further work-up including correcting her hypokalemia, obtaining an echocardiogram as she has not had any in many years, at this point I am assuming that her elevated troponin is demand ischemia in the setting of A. fib with RVR especially since she has not had any chest pain in the last 2 years, but if her troponin continues to rise we might consider ischemic work-up.  Ena Dawley, MD 07/31/2020

## 2020-07-31 NOTE — ED Notes (Signed)
Echo at bedside at this time

## 2020-07-31 NOTE — Sedation Documentation (Signed)
Synchronized shock given, 200 joules. shocked

## 2020-07-31 NOTE — ED Notes (Addendum)
Pt reports increased chest pain since arrival.  Brought back to triage and EKG repeated.

## 2020-08-01 DIAGNOSIS — I4891 Unspecified atrial fibrillation: Secondary | ICD-10-CM | POA: Diagnosis not present

## 2020-08-01 DIAGNOSIS — I129 Hypertensive chronic kidney disease with stage 1 through stage 4 chronic kidney disease, or unspecified chronic kidney disease: Secondary | ICD-10-CM | POA: Diagnosis not present

## 2020-08-01 DIAGNOSIS — N182 Chronic kidney disease, stage 2 (mild): Secondary | ICD-10-CM | POA: Diagnosis not present

## 2020-08-01 DIAGNOSIS — Z20822 Contact with and (suspected) exposure to covid-19: Secondary | ICD-10-CM | POA: Diagnosis not present

## 2020-08-01 DIAGNOSIS — Z87891 Personal history of nicotine dependence: Secondary | ICD-10-CM | POA: Diagnosis not present

## 2020-08-01 DIAGNOSIS — Z7901 Long term (current) use of anticoagulants: Secondary | ICD-10-CM | POA: Diagnosis not present

## 2020-08-01 DIAGNOSIS — I2510 Atherosclerotic heart disease of native coronary artery without angina pectoris: Secondary | ICD-10-CM | POA: Diagnosis not present

## 2020-08-01 DIAGNOSIS — J449 Chronic obstructive pulmonary disease, unspecified: Secondary | ICD-10-CM | POA: Diagnosis not present

## 2020-08-01 DIAGNOSIS — Z853 Personal history of malignant neoplasm of breast: Secondary | ICD-10-CM | POA: Diagnosis not present

## 2020-08-01 DIAGNOSIS — Z85828 Personal history of other malignant neoplasm of skin: Secondary | ICD-10-CM | POA: Diagnosis not present

## 2020-08-01 LAB — COMPREHENSIVE METABOLIC PANEL
ALT: 19 U/L (ref 0–44)
AST: 17 U/L (ref 15–41)
Albumin: 2.4 g/dL — ABNORMAL LOW (ref 3.5–5.0)
Alkaline Phosphatase: 64 U/L (ref 38–126)
Anion gap: 5 (ref 5–15)
BUN: 18 mg/dL (ref 8–23)
CO2: 26 mmol/L (ref 22–32)
Calcium: 7.8 mg/dL — ABNORMAL LOW (ref 8.9–10.3)
Chloride: 110 mmol/L (ref 98–111)
Creatinine, Ser: 0.85 mg/dL (ref 0.44–1.00)
GFR, Estimated: >60 mL/min (ref >60)
Glucose, Bld: 95 mg/dL (ref 70–99)
Potassium: 3.6 mmol/L (ref 3.5–5.1)
Sodium: 141 mmol/L (ref 135–145)
Total Bilirubin: 0.8 mg/dL (ref 0.3–1.2)
Total Protein: 4.7 g/dL — ABNORMAL LOW (ref 6.5–8.1)

## 2020-08-01 LAB — ECHOCARDIOGRAM COMPLETE
AR max vel: 1.38 cm2
AV Area VTI: 1.57 cm2
AV Area mean vel: 1.44 cm2
AV Mean grad: 4 mmHg
AV Peak grad: 8 mmHg
Ao pk vel: 1.41 m/s
Area-P 1/2: 3.77 cm2
Height: 59 in
S' Lateral: 2.6 cm
Weight: 1920 oz

## 2020-08-01 NOTE — Progress Notes (Signed)
Discharge orders received.  IV and telemetry removed, CCMD notified.  Discharge paperwork reviewed with pt and spouse.  No questions asked, they expressed understanding of f/u appts and home medications.  Pt escorted off the unit via volunteers, spouse to transport home.

## 2020-08-01 NOTE — Progress Notes (Signed)
Progress Note  Patient Name: Brittany Kirk Date of Encounter: 08/01/2020  [facility] Cardiologist: [name], MD   Subjective   Feeling much better this morning. No further episodes of chest pain. Hoping to go home today.  Inpatient Medications    Scheduled Meds: . apixaban  5 mg Oral BID  . ascorbic acid  500 mg Oral Daily  . atorvastatin  40 mg Oral QHS  . cholecalciferol  1,000 Units Oral Daily  . etomidate  8 mg Intravenous Once  . pantoprazole  40 mg Oral Daily  . potassium chloride  40 mEq Oral Once  . sodium chloride flush  3 mL Intravenous Q12H  . vitamin B-12  2,500 mcg Oral Daily  . zinc sulfate  220 mg Oral Daily   Continuous Infusions: . sodium chloride    . diltiazem (CARDIZEM) infusion Stopped (07/31/20 1708)   PRN Meds: sodium chloride, acetaminophen, ALPRAZolam, nitroGLYCERIN, ondansetron (ZOFRAN) IV, sodium chloride flush, traZODone   Vital Signs    Vitals:   07/31/20 1727 07/31/20 1857 07/31/20 2300 08/01/20 0711  BP: (!) 190/67 (!) 161/68 (!) 108/50 (!) 160/59  Pulse: (!) 54 (!) 59 (!) 50 61  Resp: (!) 28 18 18  (!) 21  Temp:  98.2 F (36.8 C) 98.3 F (36.8 C) 98 F (36.7 C)  TempSrc:  Oral Oral Oral  SpO2: 100% 100% 100% 93%  Weight:      Height:        Intake/Output Summary (Last 24 hours) at 08/01/2020 0925 Last data filed at 08/01/2020 0800 Gross per 24 hour  Intake 620 ml  Output -  Net 620 ml   Last 3 Weights 07/31/2020 05/26/2020 05/16/2020  Weight (lbs) 120 lb 118 lb 116 lb  Weight (kg) 54.432 kg 53.524 kg 52.617 kg      Telemetry    SB - Personally Reviewed  ECG    SB 59bpm, nonspecific changes - Personally Reviewed  Physical Exam   GEN: No acute distress.   Neck: No JVD Cardiac: RRR, no murmurs, rubs, or gallops.  Respiratory: Clear to auscultation bilaterally. GI: Soft, nontender, non-distended  MS: No edema; No deformity. Neuro:  Nonfocal  Psych: Normal affect   Labs    High Sensitivity  Troponin:   Recent Labs  Lab 07/31/20 1010 07/31/20 1156 07/31/20 1550 07/31/20 1905 07/31/20 2042  TROPONINIHS 53* 193* 363* 380* 279*      Chemistry Recent Labs  Lab 07/31/20 1010 08/01/20 0108  NA 142 141  K 3.1* 3.6  CL 108 110  CO2 24 26  GLUCOSE 90 95  BUN 17 18  CREATININE 0.85 0.85  CALCIUM 8.2* 7.8*  PROT  --  4.7*  ALBUMIN  --  2.4*  AST  --  17  ALT  --  19  ALKPHOS  --  64  BILITOT  --  0.8  GFRNONAA >60 >60  ANIONGAP 10 5     Hematology Recent Labs  Lab 07/31/20 1010  WBC 15.0*  RBC 4.39  HGB 14.3  HCT 45.0  MCV 102.5*  MCH 32.6  MCHC 31.8  RDW 13.4  PLT 305    BNPNo results for input(s): BNP, PROBNP in the last 168 hours.   DDimer No results for input(s): DDIMER in the last 168 hours.   Radiology    DG Chest 2 View  Result Date: 07/31/2020 CLINICAL DATA:  Acute chest pain and shortness of breath today. EXAM: CHEST - 2 VIEW COMPARISON:  03/12/2020 and  prior exams FINDINGS: The cardiomediastinal silhouette is unremarkable. There is no evidence of focal airspace disease, pulmonary edema, suspicious pulmonary nodule/mass, pleural effusion, or pneumothorax. No acute bony abnormalities are identified. Surgical clips in the RIGHT axilla are again noted. IMPRESSION: No active cardiopulmonary disease. Electronically Signed   By: Margarette Canada M.D.   On: 07/31/2020 10:40    Cardiac Studies   Echo: pending  Patient Profile     75 y.o. female with a hx of asymptomatic carotid dz, HTN, HLD, tob use, GERD, multinodular goiter, skin CA, breast CA,  PAF who was seen for the evaluation of chest pain and atrial fib at the request of Dr Laverta Baltimore.  Assessment & Plan    1. Atrial fibrillation with RVR: presented to the ED with elevated rates. Initially started on IV Diltiazem but rates remained elevated. She was cardioverted in the ER as she had missed no doses of Eliquis. Has been in SB since that time. No recurrence of afib.  -- has been transitioned off IV  dilt, continued on Eliquis 5mg  BID  2. Chest pain with elevated troponin:  hsTn rose to 380, now trending down. Has not had any further episodes of chest pain. Suspect demand ischemia in the setting of Afib RVR.  -- echo is pending read   3. HTN: liable at times, but overall stable -- on metoprolol 12.5mg  BID prior to admission  4. HLD: on high dose statin  5. Hypokalemia: K+ 3.6 this morning  For questions or updates, please contact [place] Please consult [email or web address] for contact info under        Signed, Reino Bellis, NP  08/01/2020, 9:25 AM

## 2020-08-01 NOTE — Discharge Summary (Signed)
Discharge Summary    Patient ID: Brittany Kirk MRN: [id number]; DOB: Jan 12, 1946  Admit date: 07/31/2020 Discharge date: 08/01/2020  PCP:  Susy Frizzle, MD   [place]  Cardiologist:  [name], MD  Advanced Practice Provider:  No care team member to display Electrophysiologist:  None    Discharge Diagnoses    Principal Problem:   Atrial fibrillation with RVR [facility]) Active Problems:   Chest pain with moderate risk for cardiac etiology   Atrial fibrillation with rapid ventricular response ([place])    Diagnostic Studies/Procedures    Echo: 07/31/20  IMPRESSIONS    1. Left ventricular ejection fraction, by estimation, is 55 to 60%. The  left ventricle has normal function. The left ventricle has no regional  wall motion abnormalities. There is mild left ventricular hypertrophy.  Left ventricular diastolic parameters  were normal.  2. Right ventricular systolic function is normal. The right ventricular  size is normal.  3. The mitral valve is grossly normal. Trivial mitral valve  regurgitation.  4. The aortic valve is abnormal. Aortic valve regurgitation is not  visualized. Mild aortic valve sclerosis is present, with no evidence of  aortic valve stenosis.  _____________   History of Present Illness     Brittany Kirk is a 76 y.o. female with PMH of asymptomatic carotid disease, HTN, HLD, tobacco use, GERD, skin CA, breast CA who presented to the ED with chest pain and found to be in Afib RVR.   Ms. Magnussen was in her usual state of health until she suddenly went into atrial fibrillation the morning of admission.  She felt her heart suddenly speed up and beat very irregularly.  It was beating very hard.  The rapid heartbeat gave her chest pain that reached a 9/10.  It was a squeezing pain in the center of her chest.  She had some shortness of breath with this as well.  She felt very weak.  She knew she needed to do something  about it, so she called EMS.  She was given aspirin 81 mg x 4 and a sublingual nitroglycerin.  She also got a normal saline bolus.  When she first went out of rhythm, she took her blood pressure.  Her blood pressure was very elevated, and her heart rate was greater than 150.  During transport, EMS noted her heart rate to be between 80 and 130s.  She was started on IV diltiazem which was running at 5 mg/h at the time of assessment.  Her heart rate and blood pressure improved.  Although her heart rate and blood pressure normalized, she was still in atrial fibrillation.  She was still aware of the irregular rhythm.  Her chest pain had improved, and was down to a 1/10.  She still felt weak, but did not feel short of breath.  The last time she thought she was out of rhythm, was last October when she came to the emergency room.  They did not witness any atrial fibrillation, so only PVCs.  She has not had chest pain in months, does not actually remember the last episode.  Cardiology was called to evaluate the patient in the ED for further management.    Hospital Course       1. Atrial fibrillation with RVR: presented to the ED with elevated rates. Initially started on IV Diltiazem but rates remained elevated. She was cardioverted in the ER as she had missed any doses of Eliquis. Maintained SB since that  time. No recurrence of afib.  -- was transitioned off IV dilt, continued on Eliquis 5mg  BID -- she is ordered for metoprolol 12.5mg  BID at home but has not been taking this. HR noted in the 50s on telemetry, therefore will continue to hold this at discharge -- suspect if she has a recurrence of Afib will need EP eval for AAD  2. Chest pain with elevated troponin:  hsTn rose to 380. Had not had any further episodes of chest pain. Suspect demand ischemia in the setting of Afib RVR.  -- echo with normal EF and no rWMA  3. HTN: liable at times, but overall stable -- ordered for metoprolol  12.5mg  BID prior to admission but had not been taking. With her bradycardiac will hold to resuming  4. HLD: on high dose statin  5. Hypokalemia: supplemented prior to discharge  Did the patient have an acute coronary syndrome (MI, NSTEMI, STEMI, etc) this admission?:  No.   The elevated Troponin was due to the acute medical illness (demand ischemia).      _____________  Discharge Vitals Blood pressure (!) 119/43, pulse 63, temperature 97.6 F (36.4 C), temperature source Oral, resp. rate 19, height 4\' 11"  (1.499 m), weight 54.4 kg, SpO2 98 %.  Filed Weights   07/31/20 1035  Weight: 54.4 kg    Labs & Radiologic Studies    CBC Recent Labs    07/31/20 1010  WBC 15.0*  HGB 14.3  HCT 45.0  MCV 102.5*  PLT 485   Basic Metabolic Panel Recent Labs    07/31/20 1010 08/01/20 0108  NA 142 141  K 3.1* 3.6  CL 108 110  CO2 24 26  GLUCOSE 90 95  BUN 17 18  CREATININE 0.85 0.85  CALCIUM 8.2* 7.8*   Liver Function Tests Recent Labs    08/01/20 0108  AST 17  ALT 19  ALKPHOS 64  BILITOT 0.8  PROT 4.7*  ALBUMIN 2.4*   No results for input(s): LIPASE, AMYLASE in the last 72 hours. High Sensitivity Troponin:   Recent Labs  Lab 07/31/20 1010 07/31/20 1156 07/31/20 1550 07/31/20 1905 07/31/20 2042  TROPONINIHS 53* 193* 363* 380* 279*    BNP Invalid input(s): POCBNP D-Dimer No results for input(s): DDIMER in the last 72 hours. Hemoglobin A1C No results for input(s): HGBA1C in the last 72 hours. Fasting Lipid Panel No results for input(s): CHOL, HDL, LDLCALC, TRIG, CHOLHDL, LDLDIRECT in the last 72 hours. Thyroid Function Tests Recent Labs    07/31/20 1905  TSH 0.450   _____________  DG Chest 2 View  Result Date: 07/31/2020 CLINICAL DATA:  Acute chest pain and shortness of breath today. EXAM: CHEST - 2 VIEW COMPARISON:  03/12/2020 and prior exams FINDINGS: The cardiomediastinal silhouette is unremarkable. There is no evidence of focal airspace disease,  pulmonary edema, suspicious pulmonary nodule/mass, pleural effusion, or pneumothorax. No acute bony abnormalities are identified. Surgical clips in the RIGHT axilla are again noted. IMPRESSION: No active cardiopulmonary disease. Electronically Signed   By: Margarette Canada M.D.   On: 07/31/2020 10:40   ECHOCARDIOGRAM COMPLETE  Result Date: 08/01/2020    ECHOCARDIOGRAM REPORT   Patient Name:   TAMIRRA SIENKIEWICZ Date of Exam: 07/31/2020 Medical Rec #:  [id number]        Height:       59.0 in Accession #:    [id number]       Weight:       120.0 lb Date of Birth:  Dec 30, 1945  BSA:          1.484 m Patient Age:    78 years         BP:           190/67 mmHg Patient Gender: F                HR:           54 bpm. Exam Location:  Inpatient Procedure: 2D Echo, Cardiac Doppler and Color Doppler STAT ECHO Indications:    R07.9* Chest pain, unspecified  History:        Patient has no prior history of Echocardiogram examinations.                 Atrial fibrillation post cardioversion today.  Sonographer:    Merrie Roof RDCS Referring Phys: [id number] [place]  1. Left ventricular ejection fraction, by estimation, is 55 to 60%. The left ventricle has normal function. The left ventricle has no regional wall motion abnormalities. There is mild left ventricular hypertrophy. Left ventricular diastolic parameters were normal.  2. Right ventricular systolic function is normal. The right ventricular size is normal.  3. The mitral valve is grossly normal. Trivial mitral valve regurgitation.  4. The aortic valve is abnormal. Aortic valve regurgitation is not visualized. Mild aortic valve sclerosis is present, with no evidence of aortic valve stenosis. FINDINGS  Left Ventricle: Left ventricular ejection fraction, by estimation, is 55 to 60%. The left ventricle has normal function. The left ventricle has no regional wall motion abnormalities. The left ventricular internal cavity size was normal in size. There is  mild  left ventricular hypertrophy. Left ventricular diastolic parameters were normal. Right Ventricle: The right ventricular size is normal. Right vetricular wall thickness was not assessed. Right ventricular systolic function is normal. Left Atrium: Left atrial size was normal in size. Right Atrium: Right atrial size was normal in size. Pericardium: There is no evidence of pericardial effusion. Mitral Valve: The mitral valve is grossly normal. Trivial mitral valve regurgitation. Tricuspid Valve: The tricuspid valve is normal in structure. Tricuspid valve regurgitation is trivial. Aortic Valve: The aortic valve is abnormal. Aortic valve regurgitation is not visualized. Mild aortic valve sclerosis is present, with no evidence of aortic valve stenosis. Aortic valve mean gradient measures 4.0 mmHg. Aortic valve peak gradient measures  8.0 mmHg. Aortic valve area, by VTI measures 1.57 cm. Pulmonic Valve: The pulmonic valve was not well visualized. Pulmonic valve regurgitation is trivial. Aorta: The aortic root is normal in size and structure. IAS/Shunts: The interatrial septum was not assessed.  LEFT VENTRICLE PLAX 2D LVIDd:         3.50 cm  Diastology LVIDs:         2.60 cm  LV e' medial:    8.16 cm/s LV PW:         1.30 cm  LV E/e' medial:  11.6 LV IVS:        1.30 cm  LV e' lateral:   8.81 cm/s LVOT diam:     1.70 cm  LV E/e' lateral: 10.7 LV SV:         48 LV SV Index:   32 LVOT Area:     2.27 cm  RIGHT VENTRICLE          IVC RV Basal diam:  3.60 cm  IVC diam: 1.50 cm LEFT ATRIUM             Index  RIGHT ATRIUM           Index LA diam:        3.40 cm 2.29 cm/m  RA Area:     12.70 cm LA Vol (A2C):   47.1 ml 31.73 ml/m RA Volume:   28.50 ml  19.20 ml/m LA Vol (A4C):   45.9 ml 30.92 ml/m LA Biplane Vol: 48.9 ml 32.94 ml/m  AORTIC VALVE AV Area (Vmax):    1.38 cm AV Area (Vmean):   1.44 cm AV Area (VTI):     1.57 cm AV Vmax:           141.00 cm/s AV Vmean:          85.600 cm/s AV VTI:            0.303 m AV  Peak Grad:      8.0 mmHg AV Mean Grad:      4.0 mmHg LVOT Vmax:         85.50 cm/s LVOT Vmean:        54.300 cm/s LVOT VTI:          0.210 m LVOT/AV VTI ratio: 0.69  AORTA Ao Root diam: 3.00 cm MITRAL VALVE MV Area (PHT): 3.77 cm     SHUNTS MV Decel Time: 201 msec     Systemic VTI:  0.21 m MV E velocity: 94.30 cm/s   Systemic Diam: 1.70 cm MV A velocity: 107.00 cm/s MV E/A ratio:  0.88 Dorris Carnes MD Electronically signed by Dorris Carnes MD Signature Date/Time: 08/01/2020/1:00:13 PM    Final    Disposition   Pt is being discharged home today in good condition.  Follow-up Plans & Appointments     Follow-up Information    Erlene Quan, PA-C Follow up.   Specialties: Cardiology, Radiology Why: at 11:15am for your follow up appt Contact information: [address] [place] [id number] [phone]              Discharge Instructions    Call MD for:  difficulty breathing, headache or visual disturbances   Complete by: As directed    Call MD for:  persistant dizziness or light-headedness   Complete by: As directed    Diet - low sodium heart healthy   Complete by: As directed    Increase activity slowly   Complete by: As directed       Discharge Medications   Allergies as of 08/01/2020      Reactions   Penicillins Anaphylaxis, Itching, Swelling, Rash   Has patient had a PCN reaction causing immediate rash, facial/tongue/throat swelling, SOB or lightheadedness with hypotension: Yes Has patient had a PCN reaction causing severe rash involving mucus membranes or skin necrosis: No Has patient had a PCN reaction that required hospitalization: Yes Has patient had a PCN reaction occurring within the last 10 years: No If all of the above answers are "NO", then may proceed with Cephalosporin use.   Cefaclor Itching, Swelling, Rash   Latex Itching, Rash   Metronidazole Itching, Swelling, Rash   Naproxen Itching, Swelling, Rash   Nsaids Itching, Swelling, Rash   Ibuprofen  IS tolerated   Septra [bactrim] Itching, Swelling, Rash   Sulfonamide Derivatives Itching, Swelling, Rash   Tape Itching, Rash      Medication List    STOP taking these medications   diclofenac Sodium 1 % Gel Commonly known as: Voltaren   guaiFENesin 600 MG 12 hr tablet Commonly known as: Mucinex   HYDROcodone-acetaminophen  5-325 MG tablet Commonly known as: Norco   metoprolol succinate 25 MG 24 hr tablet Commonly known as: Toprol XL   predniSONE 10 MG tablet Commonly known as: DELTASONE   traMADol 50 MG tablet Commonly known as: ULTRAM     TAKE these medications   ascorbic acid 500 MG tablet Commonly known as: VITAMIN C Take 500 mg by mouth daily.   atorvastatin 40 MG tablet Commonly known as: LIPITOR TAKE 1 TABLET BY MOUTH EVERYDAY AT BEDTIME What changed: See the new instructions.   B-12 2500 MCG Tabs Take 2,500 mcg by mouth daily.   cholecalciferol 25 MCG (1000 UNIT) tablet Commonly known as: VITAMIN D3 Take 1,000 Units by mouth daily.   Eliquis 5 MG Tabs tablet Generic drug: apixaban TAKE 1 TABLET BY MOUTH TWICE A DAY What changed: how much to take   omeprazole 20 MG capsule Commonly known as: PRILOSEC Take 20 mg by mouth daily.   traZODone 50 MG tablet Commonly known as: DESYREL TAKE 1/2 TO 1 TABLET BY MOUTH AT BEDTIME AS NEEDED FOR SLEEP   zinc gluconate 50 MG tablet Take 50 mg by mouth daily.          Outstanding Labs/Studies   N/a   Duration of Discharge Encounter   Greater than 30 minutes including physician time.  Signed, Reino Bellis, NP 08/01/2020, 1:45 PM

## 2020-08-02 ENCOUNTER — Ambulatory Visit: Payer: Medicare Other | Admitting: Urology

## 2020-08-02 ENCOUNTER — Telehealth: Payer: Self-pay

## 2020-08-02 ENCOUNTER — Encounter: Payer: Medicare Other | Admitting: Physical Therapy

## 2020-08-02 NOTE — Telephone Encounter (Signed)
Transition Care Management Follow-up Telephone Call  Date of discharge and from where: 08/01/20 from [facility].   How have you been since you were released from the hospital? Pt states that she is feeling okay but stated that she has felt a little flutter and some fatigue.   Any questions or concerns? No  Items Reviewed:  Did the pt receive and understand the discharge instructions provided? Yes   Medications obtained and verified? Yes   Other? No   Any new allergies since your discharge? No   Dietary orders reviewed? Heart Healthy  Do you have support at home? Yes   Functional Questionnaire: (I = Independent and D = Dependent) ADLs: I  Bathing/Dressing- I  Meal Prep- I  Eating- I  Maintaining continence- I  Transferring/Ambulation- I  Managing Meds- I   Follow up appointments reviewed: [facility] f/u appt confirmed? Yes  Scheduled to see Kerin Ransom, PA-C on 08/15/2020 @ 11:15am.  Are transportation arrangements needed? No   If their condition worsens, is the pt aware to call PCP or go to the Emergency Dept.? Yes  Was the patient provided with contact information for the PCP's office or ED? Yes  Was to pt encouraged to call back with questions or concerns? Yes

## 2020-08-04 ENCOUNTER — Ambulatory Visit (INDEPENDENT_AMBULATORY_CARE_PROVIDER_SITE_OTHER): Payer: Medicare Other | Admitting: Rehabilitative and Restorative Service Providers"

## 2020-08-04 ENCOUNTER — Encounter: Payer: Self-pay | Admitting: Rehabilitative and Restorative Service Providers"

## 2020-08-04 ENCOUNTER — Ambulatory Visit (INDEPENDENT_AMBULATORY_CARE_PROVIDER_SITE_OTHER): Payer: Medicare Other | Admitting: Family Medicine

## 2020-08-04 ENCOUNTER — Other Ambulatory Visit: Payer: Self-pay

## 2020-08-04 VITALS — BP 158/78 | Temp 97.2°F | Ht 59.0 in | Wt 116.0 lb

## 2020-08-04 DIAGNOSIS — R42 Dizziness and giddiness: Secondary | ICD-10-CM

## 2020-08-04 DIAGNOSIS — M6281 Muscle weakness (generalized): Secondary | ICD-10-CM

## 2020-08-04 DIAGNOSIS — R262 Difficulty in walking, not elsewhere classified: Secondary | ICD-10-CM | POA: Diagnosis not present

## 2020-08-04 NOTE — Progress Notes (Signed)
Subjective:    Patient ID: Brittany Kirk, female    DOB: May 23, 1946, 75 y.o.   MRN: [id number]  HPI  Patient was recently admitted to the hospital with an irregular heart rate and was found to be in atrial fibrillation with rapid ventricular response:  Admit date: 07/31/2020 Discharge date: 08/01/2020  PCP:  Susy Frizzle, MD              [place]  Cardiologist:  [name], MD  Advanced Practice Provider:  No care team member to display Electrophysiologist:  None    Discharge Diagnoses    Principal Problem:   Atrial fibrillation with RVR [facility]) Active Problems:   Chest pain with moderate risk for cardiac etiology   Atrial fibrillation with rapid ventricular response ([place])    Diagnostic Studies/Procedures    Echo: 07/31/20  IMPRESSIONS    1. Left ventricular ejection fraction, by estimation, is 55 to 60%. The  left ventricle has normal function. The left ventricle has no regional  wall motion abnormalities. There is mild left ventricular hypertrophy.  Left ventricular diastolic parameters  were normal.  2. Right ventricular systolic function is normal. The right ventricular  size is normal.  3. The mitral valve is grossly normal. Trivial mitral valve  regurgitation.  4. The aortic valve is abnormal. Aortic valve regurgitation is not  visualized. Mild aortic valve sclerosis is present, with no evidence of  aortic valve stenosis.  _____________   History of Present Illness     Brittany Kirk is a 75 y.o. female with PMH of asymptomatic carotid disease, HTN, HLD, tobacco use, GERD, skin CA, breast CA who presented to the ED with chest pain and found to be in Afib RVR.   [name] in her usual state of health until she suddenly went into atrial fibrillation the morning of admission. She felt her heart suddenly speed up and beat very irregularly. It was beating very hard.  The rapid heartbeat gave her chest  pain that reached a 9/10. It was a squeezing pain in the center of her chest.  She had some shortness of breath with this as well. She felt very weak. She knew she needed to do something about it, so she called EMS.  She was given aspirin 81 mg x 4 and a sublingual nitroglycerin. She also got a normal saline bolus.  When she first went out of rhythm, she took her blood pressure. Her blood pressure was very elevated, and her heart rate was greater than 150. During transport, EMS noted her heart rate to be between 80 and 130s.  She was started on IV diltiazem which was running at 5 mg/h at the time of assessment. Her heart rate and blood pressure improved.  Although her heart rate and blood pressure normalized, she was still in atrial fibrillation. She was still aware of the irregular rhythm. Her chest pain had improved, and was down to a 1/10. She still felt weak, but did not feel short of breath.  The last time she thought she was out of rhythm, was last October when she came to the emergency room. They did not witness any atrial fibrillation, so only PVCs. She has not had chest pain in months,does not actually remember the last episode.  Cardiology was called to evaluate the patient in the ED for further management.    Hospital Course      1. Atrial fibrillation with YOV:ZCHYIFOYD to the ED with elevated rates.  Initially started on IV Diltiazem but rates remained elevated. She was cardioverted in the ER as she had missed any doses of Eliquis. Maintained SB since that time. No recurrence of afib.  -- was transitioned off IV dilt, continued on Eliquis 5mg  BID -- she is ordered for metoprolol 12.5mg  BID at home but has not been taking this. HR noted in the 50s on telemetry, therefore will continue to hold this at discharge -- suspect if she has a recurrence of Afib will need EP eval for AAD  2. Chest pain with elevated troponin:hsTn rose to 380. Had not had any  further episodes of chest pain. Suspect demand ischemia in the setting of Afib RVR.  -- echo with normal EF and no rWMA  3. HTN: liable at times, but overall stable -- ordered for metoprolol 12.5mg  BID prior to admission but had not been taking. With her bradycardiac will hold to resuming  4. HLD: on high dose statin  5. Hypokalemia:supplemented prior to discharge  Did the patient have an acute coronary syndrome (MI, NSTEMI, STEMI, etc) this admission?:  No.   The elevated Troponin was due to the acute medical illness (demand ischemia).    08/04/20 Patient presents today stating that she feels extremely tired.  She states that she has very little energy.  She feels sleepy all the time since coming home from the hospital.  She also reports orthostatic dizziness.  She states that when she stands up she will feel off balance and lightheaded.  This tends to last a few minutes and then resolves gradually on its own.  Symptoms never lasted more than a few minutes.  It tends to occur the most when she is standing up from a seated position after she has been seated for a long period of time.  Seldom ever occurs when she gets up having lay down in the bed.  She is also not lightheaded or dizzy while sitting.  She denies any chest pain or shortness of breath or syncope.  She does report bilateral tinnitus and decreased hearing.  She denies any vertigo.  Past Medical History:  Diagnosis Date  . Acid reflux   . Allergic rhinitis   . Anxiety   . Asymptomatic bilateral carotid artery stenosis    40-59% (02/2018)  . Atrial fibrillation ([place]) 07/2019  . Breast cancer ([place]) 2015   Right Breast Cancer  . CAD (coronary artery disease)   . Depression   . Emphysema    no longer uses inhalers  . Gallstones    nausea, pain upper right abdomen  . GERD (gastroesophageal reflux disease)   . H/O hiatal hernia   . Hiatal hernia   . History of skin cancer   . Hyperlipidemia   . Hypertension   .  Multinodular goiter (nontoxic)   . Scoliosis   . Skin cancer   . Tobacco user   . Wears dentures    top   Past Surgical History:  Procedure Laterality Date  . BREAST LUMPECTOMY Right 2015  . CHOLECYSTECTOMY  07/08/2012   Procedure: LAPAROSCOPIC CHOLECYSTECTOMY WITH INTRAOPERATIVE CHOLANGIOGRAM;  Surgeon: Gayland Curry, MD,FACS;  Location: [facility];  Service: General;  Laterality: N/A;  Laparoscopic Cholecystectomy with Intraoperative Cholangiogram  . COLONOSCOPY    . ESOPHAGOSCOPY N/A 05/29/2017   Procedure: ESOPHAGOSCOPY;  Surgeon: Clarene Essex, MD;  Location: [place];  Service: Endoscopy;  Laterality: N/A;  botox injection  . HAND SURGERY Left    wrist  . LEFT HEART CATH AND  CORONARY ANGIOGRAPHY N/A 10/11/2017   Procedure: LEFT HEART CATH AND CORONARY ANGIOGRAPHY;  Surgeon: Martinique, Peter M, MD;  Location: [place] [facility];  Service: Cardiovascular;  Laterality: N/A;  . TONSILECTOMY, ADENOIDECTOMY, BILATERAL MYRINGOTOMY AND TUBES    . TUBAL LIGATION     Current Outpatient Medications on File Prior to Visit  Medication Sig Dispense Refill  . ascorbic acid (VITAMIN C) 500 MG tablet Take 500 mg by mouth daily.    Marland Kitchen atorvastatin (LIPITOR) 40 MG tablet TAKE 1 TABLET BY MOUTH EVERYDAY AT BEDTIME (Patient taking differently: Take 40 mg by mouth at bedtime.) 90 tablet 2  . cholecalciferol (VITAMIN D3) 25 MCG (1000 UNIT) tablet Take 1,000 Units by mouth daily.    . Cyanocobalamin (B-12) 2500 MCG TABS Take 2,500 mcg by mouth daily.    Marland Kitchen ELIQUIS 5 MG TABS tablet TAKE 1 TABLET BY MOUTH TWICE A DAY (Patient taking differently: Take 5 mg by mouth 2 (two) times daily.) 180 tablet 1  . omeprazole (PRILOSEC) 20 MG capsule Take 20 mg by mouth daily.    . traZODone (DESYREL) 50 MG tablet TAKE 1/2 TO 1 TABLET BY MOUTH AT BEDTIME AS NEEDED FOR SLEEP (Patient not taking: No sig reported) 90 tablet 3  . zinc gluconate 50 MG tablet Take 50 mg by mouth daily.     No current facility-administered  medications on file prior to visit.   Allergies  Allergen Reactions  . Penicillins Anaphylaxis, Itching, Swelling and Rash    Has patient had a PCN reaction causing immediate rash, facial/tongue/throat swelling, SOB or lightheadedness with hypotension: Yes Has patient had a PCN reaction causing severe rash involving mucus membranes or skin necrosis: No Has patient had a PCN reaction that required hospitalization: Yes Has patient had a PCN reaction occurring within the last 10 years: No If all of the above answers are "NO", then may proceed with Cephalosporin use.   . Cefaclor Itching, Swelling and Rash  . Latex Itching and Rash  . Metronidazole Itching, Swelling and Rash  . Naproxen Itching, Swelling and Rash  . Nsaids Itching, Swelling and Rash    Ibuprofen IS tolerated  . Septra [Bactrim] Itching, Swelling and Rash  . Sulfonamide Derivatives Itching, Swelling and Rash  . Tape Itching and Rash   Social History   Socioeconomic History  . Marital status: Married    Spouse name: Not on file  . Number of children: 1  . Years of education: Not on file  . Highest education level: Not on file  Occupational History  . Occupation: retired Clinical cytogeneticist: RETIRED  Tobacco Use  . Smoking status: Former Smoker    Packs/day: 0.50    Years: 46.00    Pack years: 23.00    Types: Cigarettes    Quit date: 02/13/2011    Years since quitting: 9.4  . Smokeless tobacco: Never Used  Vaping Use  . Vaping Use: Never used  Substance and Sexual Activity  . Alcohol use: No  . Drug use: No  . Sexual activity: Not on file  Other Topics Concern  . Not on file  Social History Narrative  . Not on file   Social Determinants of Health   Financial Resource Strain: Low Risk   . Difficulty of Paying Living Expenses: Not hard at all  Food Insecurity: Not on file  Transportation Needs: Not on file  Physical Activity: Not on file  Stress: Not on file  Social Connections: Not on  file  Intimate Partner Violence: Not on file     Review of Systems  All other systems reviewed and are negative.      Objective:   Physical Exam Vitals reviewed.  Constitutional:      General: She is not in acute distress.    Appearance: She is well-developed. She is not diaphoretic.  HENT:     Head: Normocephalic and atraumatic.     Right Ear: External ear normal.     Left Ear: External ear normal.     Mouth/Throat:     Pharynx: No oropharyngeal exudate.  Eyes:     Conjunctiva/sclera: Conjunctivae normal.     Pupils: Pupils are equal, round, and reactive to light.  Neck:     Thyroid: No thyromegaly.     Vascular: No JVD.  Cardiovascular:     Rate and Rhythm: Normal rate and regular rhythm.     Heart sounds: Normal heart sounds. No murmur heard. No friction rub. No gallop.   Pulmonary:     Effort: Pulmonary effort is normal. No respiratory distress.     Breath sounds: Normal breath sounds. No stridor. No wheezing or rales.  Abdominal:     General: Bowel sounds are normal. There is no distension.     Palpations: Abdomen is soft.     Tenderness: There is no abdominal tenderness. There is no guarding.  Musculoskeletal:     Cervical back: Normal range of motion.  Lymphadenopathy:     Cervical: No cervical adenopathy.  Neurological:     Mental Status: She is alert and oriented to person, place, and time.     Cranial Nerves: No cranial nerve deficit.     Sensory: No sensory deficit.     Motor: Tremor present. No atrophy, abnormal muscle tone or seizure activity.     Deep Tendon Reflexes: Reflexes are normal and symmetric.           Assessment & Plan:  Orthostatic dizziness  Patient symptoms sound most consistent with orthostatic dizziness.  I believe that this is a drop in her blood pressure due to position changes as she stands up.  She is not currently taking any medication that lowers her blood pressure.  I reviewed her lab work from the hospital which was  unremarkable.  Therefore, I have recommended that she start wearing compression hose to improve venous return to the heart and reduce the risk of orthostatic dizziness.  Also recommended that the patient stand up slowly and gradually and allow accommodation prior to starting to walk.  I believe the tinnitus is due to hearing loss and we discussed a possible referral for hearing aids but at the present time is not bothering her that badly.

## 2020-08-04 NOTE — Therapy (Signed)
[facility] Physical Therapy [address] [place], [place], [address] Phone: [phone]   Fax:  [phone]  Physical Therapy Treatment  Patient Details  Name: Brittany Kirk MRN: [id number] Date of Birth: 23-Feb-1946 Referring Provider (PT): Eunice Blase, MD   Encounter Date: 08/04/2020   PT End of Session - 08/04/20 1340    Visit Number 6    Number of Visits 12    Date for PT Re-Evaluation 09/09/20    Authorization Type medicare    Progress Note Due on Visit 10    PT Start Time 1300    PT Stop Time 1342    PT Time Calculation (min) 42 min    Activity Tolerance Patient tolerated treatment well;No increased pain    Behavior During Therapy WFL for tasks assessed/performed           Past Medical History:  Diagnosis Date  . Acid reflux   . Allergic rhinitis   . Anxiety   . Asymptomatic bilateral carotid artery stenosis    40-59% (02/2018)  . Atrial fibrillation ([place] 07/2019  . Breast cancer ([place]) 2015   Right Breast Cancer  . CAD (coronary artery disease)   . Depression   . Emphysema    no longer uses inhalers  . Gallstones    nausea, pain upper right abdomen  . GERD (gastroesophageal reflux disease)   . H/O hiatal hernia   . Hiatal hernia   . History of skin cancer   . Hyperlipidemia   . Hypertension   . Multinodular goiter (nontoxic)   . Scoliosis   . Skin cancer   . Tobacco user   . Wears dentures    top    Past Surgical History:  Procedure Laterality Date  . BREAST LUMPECTOMY Right 2015  . CHOLECYSTECTOMY  07/08/2012   Procedure: LAPAROSCOPIC CHOLECYSTECTOMY WITH INTRAOPERATIVE CHOLANGIOGRAM;  Surgeon: Gayland Curry, MD,FACS;  [facility];  Service: General;  Laterality: N/A;  Laparoscopic Cholecystectomy with Intraoperative Cholangiogram  . COLONOSCOPY    . ESOPHAGOSCOPY N/A 05/29/2017   Procedure: ESOPHAGOSCOPY;  Surgeon: Clarene Essex, MD;  Location: [place];  Service: Endoscopy;  Laterality: N/A;  botox injection  .  HAND SURGERY Left    wrist  . LEFT HEART CATH AND CORONARY ANGIOGRAPHY N/A 10/11/2017   Procedure: LEFT HEART CATH AND CORONARY ANGIOGRAPHY;  Surgeon: Martinique, Peter M, MD;  Location: [place] [facility];  Service: Cardiovascular;  Laterality: N/A;  . TONSILECTOMY, ADENOIDECTOMY, BILATERAL MYRINGOTOMY AND TUBES    . TUBAL LIGATION      There were no vitals filed for this visit.   Subjective Assessment - 08/04/20 1332    Subjective [name] reports no pain today.  Strength, balance and endurance are the issue.  She was in the hospital Sunday night and is a bit fatigued as the result of her admission.    Pertinent History Pt recent MRI on 06/13/2020 with findings: acute nondisplaced fx L pubic root/anterior acetabulum, nondisplaced fx of L pubic bone inferior ramis, acute bilisacral alar fx, OA of anterisuperior labrus.    Limitations Sitting;Standing;Walking;House hold activities    How long can you sit comfortably? it's getting up after sitting that hurts    How long can you stand comfortably? ~ 60 minutes    How long can you walk comfortably? 15 minutes without the walker    Patient Stated Goals Stop hurting    Currently in Pain? No/denies    Pain Onset More than a month ago  [facility] PT Treatment/Exercise - 08/04/20 0001      Neuro Re-ed    Neuro Re-ed Details  Standing on foam feet together eyes open and closed 3X 30 seconds each and tandem balance eyes open 3X 30 seconds      Exercises   Exercises Knee/Hip      Knee/Hip Exercises: Stretches   Passive Hamstring Stretch Both;4 reps;20 seconds    Passive Hamstring Stretch Limitations supine      Knee/Hip Exercises: Aerobic   Nustep L5 x 8 min      Knee/Hip Exercises: Machines for Strengthening   Cybex Leg Press 75# 10X and 81# 2 sets of 10 slow eccentrics      Knee/Hip Exercises: Standing   Heel Raises Both;2 sets;10 reps   slow eccentrics                 PT Education -  08/04/20 1335    Education Details Encouraged leg press, heel raises and balance at home.    Person(s) Educated Patient    Methods Explanation;Demonstration;Verbal cues    Comprehension Verbal cues required;Returned demonstration;Need further instruction;Verbalized understanding            PT Short Term Goals - 08/04/20 1336      PT SHORT TERM GOAL #1   Title Pt will be independent in her initial HEP.    Baseline Met 08/29/16    Status Achieved      PT SHORT TERM GOAL #2   Title Will participate in balance assessments via [place] with LTG to be set.    Status Achieved             PT Long Term Goals - 08/04/20 1337      PT LONG TERM GOAL #1   Title Pt will be able to demonstrate >/= 4+/5 in bilateral LE"s for improved functional mobility and gait.    Status On-going      PT LONG TERM GOAL #2   Title Pt will be able to amb 500 feet without assistive safely with normalized gait pattern on level surfaces.    Status Achieved      PT LONG TERM GOAL #3   Title Pt will improve her FOTO from 52% to >/= 69% function.    Status On-going      PT LONG TERM GOAL #4   Title Pt will be able to improve her 5 time sit stand </= 14 seconds with no UE support.    Baseline Exactly 14 seconds 08/04/2020    Status Achieved      PT LONG TERM GOAL #5   Title Pt will be able to report her L hip pain is </= 2/10 with transfers and walking house hold distances.    Status Achieved      PT LONG TERM GOAL #6   Title Pt will improve her BERG balance score from 47/56 to >/= 52/56    Status On-going                 Plan - 08/04/20 1342    Clinical Impression Statement Azhane reports no pain since last visit.  She is a bit fatigued as a result of being in the hospital overnight Sunday with her heart.  We reviewed her program with no concerns today.  Probable RA and DC next visit if her balance assessment is satisfactory and she feels ready for independent rehabilitation.    Personal  Factors and Comorbidities Age;Comorbidity 3+    Comorbidities  Pt recent MRI on 06/13/2020 with findings: acute nondisplaced fx L pubic root/anterior acetabulum, nondisplaced fx of L pubic bone inferior ramis, acute bilisacral alar fx, OA of anterisuperior labrus. h/o skin cancer, A-fib, CAD, heart cath 2019, breast cancer    Examination-Activity Limitations Squat;Stairs;Transfers;Stand    Examination-Participation Restrictions Cleaning;Community Activity;Driving;Other    Stability/Clinical Decision Making Stable/Uncomplicated    Clinical Decision Making Low    Rehab Potential Good    PT Frequency 2x / week    PT Duration 6 weeks    PT Treatment/Interventions ADLs/Self Care Home Management;Cryotherapy;Moist Heat;Gait training;Stair training;Functional mobility training;Therapeutic activities;Patient/family education;Neuromuscular re-education;Balance training;Therapeutic exercise;Manual techniques;Passive range of motion;Taping    PT Next Visit Plan RA for possible DC    PT Home Exercise Plan [id number]    Consulted and Agree with Plan of Care Patient           Patient will benefit from skilled therapeutic intervention in order to improve the following deficits and impairments:  Pain,Impaired flexibility,Decreased balance,Decreased strength,Decreased range of motion,Decreased activity tolerance,Difficulty walking,Postural dysfunction  Visit Diagnosis: Difficulty in walking, not elsewhere classified  Muscle weakness (generalized)     Problem List Patient Active Problem List   Diagnosis Date Noted  . Atrial fibrillation with rapid ventricular response ([place]) 07/31/2020  . Upper respiratory tract infection 06/09/2020  . Atrial fibrillation with RVR ([place]) 07/15/2019  . Multinodular goiter (nontoxic)   . Asymptomatic bilateral carotid artery stenosis   . History of depression 02/14/2018  . History of breast cancer 02/14/2018  . Accelerated hypertension 02/14/2018  . Unstable angina ([place])  10/11/2017  . Caloric malnutrition ([place]) 08/16/2017  . Weight loss, unintentional 03/12/2017  . GERD (gastroesophageal reflux disease) 08/10/2016  . Cough 08/10/2016  . Acute bronchitis 07/12/2016  . Bilateral carotid artery disease ([place]) 02/14/2016  . Chest pain with moderate risk for cardiac etiology 01/13/2016  . Osteoporosis 02/22/2015  . Stage 2 chronic kidney disease due to benign hypertension 08/16/2014  . Malignant neoplasm of upper-outer quadrant of right breast in female, estrogen receptor positive ([place]) 03/22/2014  . Dizziness and giddiness 11/27/2012  . Essential tremor 11/27/2012  . Numbness 11/27/2012  . TOBACCO USER 10/04/2009  . CLOSED FRACTURE OF ONE RIB 07/12/2009  . Dyslipidemia, goal LDL below 70 09/28/2008  . CAD in native artery 09/28/2008  . Rhinitis 09/09/2008  . COPD (chronic obstructive pulmonary disease) ([place]) 09/07/2008  . Essential hypertension 09/06/2008  . SKIN CANCER, HX OF 09/06/2008    Farley Ly PT, MPT 08/04/2020, 1:44 PM  [facility] Physical Therapy [address] [place], [place], [address] Phone: [phone]   Fax:  [phone]  Name: LAZARIAH SAVARD MRN: [id number] Date of Birth: 1946/03/01

## 2020-08-09 ENCOUNTER — Ambulatory Visit: Payer: Medicare Other | Admitting: Urology

## 2020-08-12 ENCOUNTER — Telehealth: Payer: Self-pay

## 2020-08-15 ENCOUNTER — Other Ambulatory Visit: Payer: Self-pay

## 2020-08-15 ENCOUNTER — Encounter: Payer: Self-pay | Admitting: Cardiology

## 2020-08-15 ENCOUNTER — Ambulatory Visit (INDEPENDENT_AMBULATORY_CARE_PROVIDER_SITE_OTHER): Payer: Medicare Other | Admitting: Cardiology

## 2020-08-15 VITALS — BP 154/70 | HR 57 | Ht 59.0 in | Wt 115.8 lb

## 2020-08-15 DIAGNOSIS — I2510 Atherosclerotic heart disease of native coronary artery without angina pectoris: Secondary | ICD-10-CM | POA: Diagnosis not present

## 2020-08-15 DIAGNOSIS — I10 Essential (primary) hypertension: Secondary | ICD-10-CM | POA: Diagnosis not present

## 2020-08-15 DIAGNOSIS — I4891 Unspecified atrial fibrillation: Secondary | ICD-10-CM | POA: Diagnosis not present

## 2020-08-15 DIAGNOSIS — I6523 Occlusion and stenosis of bilateral carotid arteries: Secondary | ICD-10-CM

## 2020-08-15 DIAGNOSIS — Z853 Personal history of malignant neoplasm of breast: Secondary | ICD-10-CM | POA: Diagnosis not present

## 2020-08-15 NOTE — Progress Notes (Signed)
Cardiology Office Note:    Date:  08/15/2020   ID:  Brittany Kirk, DOB 1946/04/18, MRN [id number]  PCP:  Brittany Frizzle, MD  Cardiologist:  Brittany Burow, MD  Electrophysiologist:  None   Referring MD: Brittany Frizzle, MD   No chief complaint on file. post hospital follow up  History of Present Illness:    Brittany Kirk is a 75 y.o. female with a hx of minor coronary disease a catheterization in 2019 with an 85% small optional diagonal, hypertension, dyslipidemia, moderate bilateral carotid disease, breast cancer status post lumpectomy in 2015, and PAF.  She was originally diagnosed with PAF in February 2021.  Eliquis was added at that time, she did convert spontaneously back to sinus rhythm.  At baseline she is in sinus bradycardia.  She tells me she was unable to tolerate low-dose metoprolol.  She was recently admitted overnight 07/31/2020 for recurrent atrial fibrillation.  She had been compliant with her Eliquis and she underwent cardioversion.  She is in office today for follow-up.  Since discharge she has done well.  She does not think she has had recurrent atrial fibrillation.  Her EKG today shows sinus rhythm with a heart rate of 57.  She had some other concerns, she has had problems sleeping.  Her TSH was normal.  She also has dependent rubor, she does not have claudication.  Past Medical History:  Diagnosis Date  . Acid reflux   . Allergic rhinitis   . Anxiety   . Asymptomatic bilateral carotid artery stenosis    40-59% (02/2018)  . Atrial fibrillation ([place]) 07/2019  . Breast cancer ([place]) 2015   Right Breast Cancer  . CAD (coronary artery disease)   . Depression   . Emphysema    no longer uses inhalers  . Gallstones    nausea, pain upper right abdomen  . GERD (gastroesophageal reflux disease)   . H/O hiatal hernia   . Hiatal hernia   . History of skin cancer   . Hyperlipidemia   . Hypertension   . Multinodular goiter (nontoxic)   . Scoliosis   . Skin  cancer   . Tobacco user   . Wears dentures    top    Past Surgical History:  Procedure Laterality Date  . BREAST LUMPECTOMY Right 2015  . CHOLECYSTECTOMY  07/08/2012   Procedure: LAPAROSCOPIC CHOLECYSTECTOMY WITH INTRAOPERATIVE CHOLANGIOGRAM;  Surgeon: Brittany Curry, MD,FACS;  [facility];  Service: General;  Laterality: N/A;  Laparoscopic Cholecystectomy with Intraoperative Cholangiogram  . COLONOSCOPY    . ESOPHAGOSCOPY N/A 05/29/2017   Procedure: ESOPHAGOSCOPY;  Surgeon: Brittany Essex, MD;  Location: [facility];  Service: Endoscopy;  Laterality: N/A;  botox injection  . HAND SURGERY Left    wrist  . LEFT HEART CATH AND CORONARY ANGIOGRAPHY N/A 10/11/2017   Procedure: LEFT HEART CATH AND CORONARY ANGIOGRAPHY;  Surgeon: Kirk, Brittany M, MD;  Location: [facility];  Service: Cardiovascular;  Laterality: N/A;  . TONSILECTOMY, ADENOIDECTOMY, BILATERAL MYRINGOTOMY AND TUBES    . TUBAL LIGATION      Current Medications: Current Meds  Medication Sig  . ascorbic acid (VITAMIN C) 500 MG tablet Take 500 mg by mouth daily.  Marland Kitchen atorvastatin (LIPITOR) 40 MG tablet TAKE 1 TABLET BY MOUTH EVERYDAY AT BEDTIME (Patient taking differently: Take 40 mg by mouth at bedtime.)  . cholecalciferol (VITAMIN D3) 25 MCG (1000 UNIT) tablet Take 1,000 Units by mouth daily.  . Cyanocobalamin (B-12) 2500 MCG TABS Take  2,500 mcg by mouth daily.  Marland Kitchen ELIQUIS 5 MG TABS tablet TAKE 1 TABLET BY MOUTH TWICE A DAY (Patient taking differently: Take 5 mg by mouth 2 (two) times daily.)  . omeprazole (PRILOSEC) 20 MG capsule Take 20 mg by mouth daily.  Marland Kitchen zinc gluconate 50 MG tablet Take 50 mg by mouth daily.     Allergies:   Penicillins, Cefaclor, Latex, Metronidazole, Naproxen, Nsaids, Septra [bactrim], Sulfonamide derivatives, and Tape   Social History   Socioeconomic History  . Marital status: Married    Spouse name: Not on file  . Number of children: 1  . Years of education: Not on file  . Highest  education level: Not on file  Occupational History  . Occupation: retired Clinical cytogeneticist: RETIRED  Tobacco Use  . Smoking status: Former Smoker    Packs/day: 0.50    Years: 46.00    Pack years: 23.00    Types: Cigarettes    Quit date: 02/13/2011    Years since quitting: 9.5  . Smokeless tobacco: Never Used  Vaping Use  . Vaping Use: Never used  Substance and Sexual Activity  . Alcohol use: No  . Drug use: No  . Sexual activity: Not on file  Other Topics Concern  . Not on file  Social History Narrative  . Not on file   Social Determinants of Health   Financial Resource Strain: Low Risk   . Difficulty of Paying Living Expenses: Not hard at all  Food Insecurity: Not on file  Transportation Needs: Not on file  Physical Activity: Not on file  Stress: Not on file  Social Connections: Not on file     Family History: The patient's family history includes Aplastic anemia in an other family member; Arthritis in her mother; Diverticulitis in her sister; Heart disease in her sister and another family member; Pancreatic cancer in her mother.  ROS:   Please see the history of present illness.     All other systems reviewed and are negative.  EKGs/Labs/Other Studies Reviewed:    The following studies were reviewed today: Carotid doppler 02/26/2020- Summary:  Right Carotid: Velocities in the right ICA are consistent with a 60-79%         stenosis. Non-hemodynamically significant plaque <50% noted  in         the CCA.   Left Carotid: Velocities in the left ICA are consistent with a 40-59%  stenosis.        The ECA appears >50% stenosed.   Vertebrals: Bilateral vertebral arteries demonstrate antegrade flow.  Subclavians: Normal flow hemodynamics were seen in bilateral subclavian        arteries.   *See table(s) above for measurements and observations.  Suggest follow up study in 12 months.   Echo 07/31/2020- IMPRESSIONS     1. Left ventricular ejection fraction, by estimation, is 55 to 60%. The  left ventricle has normal function. The left ventricle has no regional  wall motion abnormalities. There is mild left ventricular hypertrophy.  Left ventricular diastolic parameters  were normal.  2. Right ventricular systolic function is normal. The right ventricular  size is normal.  3. The mitral valve is grossly normal. Trivial mitral valve  regurgitation.  4. The aortic valve is abnormal. Aortic valve regurgitation is not  visualized. Mild aortic valve sclerosis is present, with no evidence of  aortic valve stenosis.   EKG:  EKG is ordered today.  The ekg ordered today demonstrates NSR,  SB-57  Recent Labs: 07/31/2020: Hemoglobin 14.3; Platelets 305; TSH 0.450 08/01/2020: ALT 19; BUN 18; Creatinine, Ser 0.85; Potassium 3.6; Sodium 141  Recent Lipid Panel    Component Value Date/Time   CHOL 133 07/15/2019 0934   CHOL 124 02/27/2017 0940   TRIG 48 07/15/2019 0934   HDL 59 07/15/2019 0934   HDL 58 02/27/2017 0940   CHOLHDL 2.3 07/15/2019 0934   VLDL 10 07/15/2019 0934   LDLCALC 64 07/15/2019 0934   LDLCALC 61 07/14/2018 1149    Physical Exam:    VS:  BP (!) 154/70   Pulse (!) 57   Ht 4\' 11"  (1.499 Kirk)   Wt 115 lb 12.8 oz (52.5 kg)   BMI 23.39 kg/Kirk     Wt Readings from Last 3 Encounters:  08/15/20 115 lb 12.8 oz (52.5 kg)  08/04/20 116 lb (52.6 kg)  07/31/20 120 lb (54.4 kg)     GEN:  Well nourished, well developed in no acute distress HEENT: Normal NECK: No JVD; bilateral carotid bruits CARDIAC: RRR, no murmurs, rubs, gallops RESPIRATORY:  Clear to auscultation without rales, wheezing or rhonchi  ABDOMEN: Soft, non-tender, non-distended MUSCULOSKELETAL:  No edema; No deformity  SKIN: Warm and dry NEUROLOGIC:  Alert and oriented x 3 PSYCHIATRIC:  Normal affect   ASSESSMENT:    Atrial fibrillation with RVR (HCC) Recurrent PAF requiring DCCV 07/31/2020- NSR- SB at baseline  CAD  in native artery Last cath May 2019- 35% RCA, 40% LAD, 85% small RI- medical Rx She did have evidence of demand ischemia when in AF with RVR  Bilateral carotid artery disease (HCC) Moderate carotid artery disease- f/u dopplers Sept 2022  Essential hypertension H/O labile B/P  History of breast cancer S/P lumpectomy and lymph node removal Rt arm  PLAN:    I told the patient she could take a metoprolol 12.5 mg dose if she had recurrent A. fib at home that lasted more than 15 or 20 minutes.  After 30 minutes of that did not help she could repeat it.  If she remained in A. fib with RVR and uncomfortable she would then have to go to the emergency room.  I think if she has recurrent PAF in the next few months she will probably need an EP referral.  She does have a sick sinus component.   Medication Adjustments/Labs and Tests Ordered: Current medicines are reviewed at length with the patient today.  Concerns regarding medicines are outlined above.  Orders Placed This Encounter  Procedures  . EKG 12-Lead  . VAS US CAROTID   No orders of the defined types were placed in this encounter.   Patient Instructions  Medication Instructions:  Continue current medication  *If you need a refill on your cardiac medications before your next appointment, please call your pharmacy*   Lab Work: None Ordered   Testing/Procedures: Your physician has requested that you have a carotid duplex in 6 Months. This test is an ultrasound of the carotid arteries in your neck. It looks at blood flow through these arteries that supply the brain with blood. Allow one hour for this exam. There are no restrictions or special instructions.   Follow-Up: At [facility], you and your health needs are our priority.  As part of our continuing mission to provide you with exceptional heart care, we have created designated Provider Care Teams.  These Care Teams include your primary Cardiologist (physician) and Advanced  Practice Providers (APPs -  Physician Assistants and Nurse Practitioners)  who all work together to provide you with the care you need, when you need it.  We recommend signing up for the patient portal called "MyChart".  Sign up information is provided on this After Visit Summary.  MyChart is used to connect with patients for Virtual Visits (Telemedicine).  Patients are able to view lab/test results, encounter notes, upcoming appointments, etc.  Non-urgent messages can be sent to your provider as well.   To learn more about what you can do with MyChart, go to [email or web address].    Your next appointment:   6 month(s)  The format for your next appointment:   In Person  Provider:   You may see Brittany Burow, MD or one of the following Advanced Practice Providers on your designated Care Team:    Sande Rives, PA-C  Coletta Memos, FNP         Signed, Kerin Ransom, [place]  08/15/2020 11:48 AM    [place]

## 2020-08-15 NOTE — Assessment & Plan Note (Signed)
S/P lumpectomy and lymph node removal Rt arm

## 2020-08-15 NOTE — Assessment & Plan Note (Signed)
H/O labile B/P

## 2020-08-15 NOTE — Patient Instructions (Signed)
Medication Instructions:  Continue current medication  *If you need a refill on your cardiac medications before your next appointment, please call your pharmacy*   Lab Work: None Ordered   Testing/Procedures: Your physician has requested that you have a carotid duplex in 6 Months. This test is an ultrasound of the carotid arteries in your neck. It looks at blood flow through these arteries that supply the brain with blood. Allow one hour for this exam. There are no restrictions or special instructions.   Follow-Up: At [facility], you and your health needs are our priority.  As part of our continuing mission to provide you with exceptional heart care, we have created designated Provider Care Teams.  These Care Teams include your primary Cardiologist (physician) and Advanced Practice Providers (APPs -  Physician Assistants and Nurse Practitioners) who all work together to provide you with the care you need, when you need it.  We recommend signing up for the patient portal called "MyChart".  Sign up information is provided on this After Visit Summary.  MyChart is used to connect with patients for Virtual Visits (Telemedicine).  Patients are able to view lab/test results, encounter notes, upcoming appointments, etc.  Non-urgent messages can be sent to your provider as well.   To learn more about what you can do with MyChart, go to [email or web address].    Your next appointment:   6 month(s)  The format for your next appointment:   In Person  Provider:   You may see [name], MD or one of the following Advanced Practice Providers on your designated Care Team:    [place], PA-C  Coletta Memos, FNP

## 2020-08-15 NOTE — Assessment & Plan Note (Signed)
Recurrent PAF requiring DCCV 07/31/2020- NSR- SB at baseline

## 2020-08-15 NOTE — Assessment & Plan Note (Signed)
Last cath May 2019- 35% RCA, 40% LAD, 85% small RI- medical Rx She did have evidence of demand ischemia when in AF with RVR

## 2020-08-15 NOTE — Assessment & Plan Note (Signed)
Moderate carotid artery disease- f/u dopplers Sept 2022

## 2020-08-18 ENCOUNTER — Other Ambulatory Visit: Payer: Self-pay

## 2020-08-18 ENCOUNTER — Encounter: Payer: Self-pay | Admitting: Physical Therapy

## 2020-08-18 ENCOUNTER — Ambulatory Visit (INDEPENDENT_AMBULATORY_CARE_PROVIDER_SITE_OTHER): Payer: Medicare Other | Admitting: Physical Therapy

## 2020-08-18 DIAGNOSIS — M6281 Muscle weakness (generalized): Secondary | ICD-10-CM

## 2020-08-18 DIAGNOSIS — M25552 Pain in left hip: Secondary | ICD-10-CM

## 2020-08-18 DIAGNOSIS — R262 Difficulty in walking, not elsewhere classified: Secondary | ICD-10-CM | POA: Diagnosis not present

## 2020-08-18 NOTE — Therapy (Signed)
[facility] [address] [place], [place], [address] Phone: [phone]   Fax:  [phone]  Physical Therapy Treatment/Discharge Summary  Patient Details  Name: Brittany Kirk MRN: [id number] Date of Birth: [birth year] Referring Provider (PT): Eunice Blase, MD   Encounter Date: 08/18/2020   PT End of Session - 08/18/20 1415    Visit Number 7    Date for PT Re-Evaluation 09/09/20    Authorization Type medicare    Progress Note Due on Visit 10    PT Start Time 0175    PT Stop Time 1415    PT Time Calculation (min) 30 min    Activity Tolerance Patient tolerated treatment well;No increased pain    Behavior During Therapy WFL for tasks assessed/performed           Past Medical History:  Diagnosis Date  . Acid reflux   . Allergic rhinitis   . Anxiety   . Asymptomatic bilateral carotid artery stenosis    40-59% (02/2018)  . Atrial fibrillation ([place]) 07/2019  . Breast cancer ([place]) 2015   Right Breast Cancer  . CAD (coronary artery disease)   . Depression   . Emphysema    no longer uses inhalers  . Gallstones    nausea, pain upper right abdomen  . GERD (gastroesophageal reflux disease)   . H/O hiatal hernia   . Hiatal hernia   . History of skin cancer   . Hyperlipidemia   . Hypertension   . Multinodular goiter (nontoxic)   . Scoliosis   . Skin cancer   . Tobacco user   . Wears dentures    top    Past Surgical History:  Procedure Laterality Date  . BREAST LUMPECTOMY Right 2015  . CHOLECYSTECTOMY  07/08/2012   Procedure: LAPAROSCOPIC CHOLECYSTECTOMY WITH INTRAOPERATIVE CHOLANGIOGRAM;  Surgeon: Gayland Curry, MD,FACS;  [facility];  Service: General;  Laterality: N/A;  Laparoscopic Cholecystectomy with Intraoperative Cholangiogram  . COLONOSCOPY    . ESOPHAGOSCOPY N/A 05/29/2017   Procedure: ESOPHAGOSCOPY;  Surgeon: Clarene Essex, MD;  Location: [facility];  Service: Endoscopy;  Laterality: N/A;  botox injection  . HAND  SURGERY Left    wrist  . LEFT HEART CATH AND CORONARY ANGIOGRAPHY N/A 10/11/2017   Procedure: LEFT HEART CATH AND CORONARY ANGIOGRAPHY;  Surgeon: Martinique, Peter M, MD;  Location: [facility];  Service: Cardiovascular;  Laterality: N/A;  . TONSILECTOMY, ADENOIDECTOMY, BILATERAL MYRINGOTOMY AND TUBES    . TUBAL LIGATION      There were no vitals filed for this visit.   Subjective Assessment - 08/18/20 1349    Subjective feels ready to d/c today.    Pertinent History Pt recent MRI on 06/13/2020 with findings: acute nondisplaced fx L pubic root/anterior acetabulum, nondisplaced fx of L pubic bone inferior ramis, acute bilisacral alar fx, OA of anterisuperior labrus.    Limitations Sitting;Standing;Walking;House hold activities    How long can you sit comfortably? it's getting up after sitting that hurts    How long can you stand comfortably? ~ 60 minutes    How long can you walk comfortably? 15 minutes without the walker    Patient Stated Goals Stop hurting    Currently in Pain? No/denies              [facility] PT Assessment - 08/18/20 1402      Assessment   Medical Diagnosis L hip pain M25.552    Referring Provider (PT) Hilts, Legrand [place], MD  Observation/Other Assessments   Focus on Therapeutic Outcomes (FOTO)  76      Strength   Left Hip Flexion 4/5    Left Hip External Rotation 4/5    Left Hip Internal Rotation 4/5                         [facility] Adult PT Treatment/Exercise - 08/18/20 1349      Berg Balance Test   Sit to Stand Able to stand without using hands and stabilize independently    Standing Unsupported Able to stand safely 2 minutes    Sitting with Back Unsupported but Feet Supported on Floor or Stool Able to sit safely and securely 2 minutes    Stand to Sit Sits safely with minimal use of hands    Transfers Able to transfer safely, minor use of hands    Standing Unsupported with Eyes Closed Able to stand 10 seconds safely    Standing Ubsupported  with Feet Together Able to place feet together independently and stand 1 minute safely    From Standing, Reach Forward with Outstretched Arm Can reach confidently >25 cm (10")    From Standing Position, Pick up Object from Floor Able to pick up shoe safely and easily    From Standing Position, Turn to Look Behind Over each Shoulder Looks behind from both sides and weight shifts well    Turn 360 Degrees Able to turn 360 degrees safely in 4 seconds or less    Standing Unsupported, Alternately Place Feet on Step/Stool Able to stand independently and safely and complete 8 steps in 20 seconds    Standing Unsupported, One Foot in Front Able to place foot tandem independently and hold 30 seconds    Standing on One Leg Able to lift leg independently and hold equal to or more than 3 seconds    Total Score 54      Knee/Hip Exercises: Aerobic   Nustep L5 x 8 min                    PT Short Term Goals - 08/04/20 1336      PT SHORT TERM GOAL #1   Title Pt will be independent in her initial HEP.    Baseline Met 08/29/16    Status Achieved      PT SHORT TERM GOAL #2   Title Will participate in balance assessments via [place] with LTG to be set.    Status Achieved             PT Long Term Goals - 08/18/20 1416      PT LONG TERM GOAL #1   Title Pt will be able to demonstrate >/= 4+/5 in bilateral LE"s for improved functional mobility and gait.    Baseline improved to 4/5    Status Partially Met      PT LONG TERM GOAL #2   Title Pt will be able to amb 500 feet without assistive safely with normalized gait pattern on level surfaces.    Status Achieved      PT LONG TERM GOAL #3   Title Pt will improve her FOTO from 52% to >/= 69% function.    Status Achieved      PT LONG TERM GOAL #4   Title Pt will be able to improve her 5 time sit stand </= 14 seconds with no UE support.    Baseline Exactly 14 seconds 08/04/2020    Status  Achieved      PT LONG TERM GOAL #5   Title Pt  will be able to report her L hip pain is </= 2/10 with transfers and walking house hold distances.    Status Achieved      PT LONG TERM GOAL #6   Title Pt will improve her BERG balance score from 47/56 to >/= 52/56    Status Achieved                 Plan - 08/18/20 1416    Clinical Impression Statement Pt has met all LTGs and is ready for d/c at this time.  Will d/c to HEP today.    Personal Factors and Comorbidities Age;Comorbidity 3+    Comorbidities Pt recent MRI on 06/13/2020 with findings: acute nondisplaced fx L pubic root/anterior acetabulum, nondisplaced fx of L pubic bone inferior ramis, acute bilisacral alar fx, OA of anterisuperior labrus. h/o skin cancer, A-fib, CAD, heart cath 2019, breast cancer    Examination-Activity Limitations Squat;Stairs;Transfers;Stand    Examination-Participation Restrictions Cleaning;Community Activity;Driving;Other    Stability/Clinical Decision Making Stable/Uncomplicated    Rehab Potential Good    PT Frequency 2x / week    PT Duration 6 weeks    PT Treatment/Interventions ADLs/Self Care Home Management;Cryotherapy;Moist Heat;Gait training;Stair training;Functional mobility training;Therapeutic activities;Patient/family education;Neuromuscular re-education;Balance training;Therapeutic exercise;Manual techniques;Passive range of motion;Taping    PT Next Visit Plan d/c PT today    PT Home Exercise Plan [id number]    Consulted and Agree with Plan of Care Patient           Patient will benefit from skilled therapeutic intervention in order to improve the following deficits and impairments:  Pain,Impaired flexibility,Decreased balance,Decreased strength,Decreased range of motion,Decreased activity tolerance,Difficulty walking,Postural dysfunction  Visit Diagnosis: Difficulty in walking, not elsewhere classified  Muscle weakness (generalized)  Pain in left hip     Problem List Patient Active Problem List   Diagnosis Date Noted  .  Atrial fibrillation with rapid ventricular response ([place]) 07/31/2020  . Upper respiratory tract infection 06/09/2020  . Atrial fibrillation with RVR ([place]) 07/15/2019  . Multinodular goiter (nontoxic)   . Asymptomatic bilateral carotid artery stenosis   . History of depression 02/14/2018  . History of breast cancer 02/14/2018  . Accelerated hypertension 02/14/2018  . Unstable angina ([place]) 10/11/2017  . Caloric malnutrition ([place]) 08/16/2017  . Weight loss, unintentional 03/12/2017  . GERD (gastroesophageal reflux disease) 08/10/2016  . Cough 08/10/2016  . Acute bronchitis 07/12/2016  . Bilateral carotid artery disease ([place]) 02/14/2016  . Chest pain with moderate risk for cardiac etiology 01/13/2016  . Osteoporosis 02/22/2015  . Stage 2 chronic kidney disease due to benign hypertension 08/16/2014  . Malignant neoplasm of upper-outer quadrant of right breast in female, estrogen receptor positive ([place]) 03/22/2014  . Dizziness and giddiness 11/27/2012  . Essential tremor 11/27/2012  . Numbness 11/27/2012  . TOBACCO USER 10/04/2009  . CLOSED FRACTURE OF ONE RIB 07/12/2009  . Dyslipidemia, goal LDL below 70 09/28/2008  . CAD in native artery 09/28/2008  . Rhinitis 09/09/2008  . COPD (chronic obstructive pulmonary disease) ([place]) 09/07/2008  . Essential hypertension 09/06/2008  . SKIN CANCER, HX OF 09/06/2008      Laureen Abrahams, PT, DPT 08/18/20 2:17 PM   [facility] [address] [place], [place], [address] Phone: [phone]   Fax:  [phone]  Name: SHARNITA BOGUCKI MRN: [id number] Date of Birth: [birth year]    PHYSICAL THERAPY DISCHARGE SUMMARY  Visits from Start of  Care: 7  Current functional level related to goals / functional outcomes: See above   Remaining deficits: See above   Education / Equipment: HEP  Plan: Patient agrees to discharge.  Patient goals were met. Patient is being discharged due to meeting the stated  rehab goals.  ?????     Laureen Abrahams, PT, DPT 08/18/20 2:18 PM  [facility] [address] [place], [place], [address] Phone: [phone]   Fax:  [phone]

## 2020-08-22 DIAGNOSIS — R309 Painful micturition, unspecified: Secondary | ICD-10-CM | POA: Diagnosis not present

## 2020-08-22 DIAGNOSIS — N760 Acute vaginitis: Secondary | ICD-10-CM | POA: Diagnosis not present

## 2020-08-30 ENCOUNTER — Other Ambulatory Visit: Payer: Self-pay | Admitting: Family Medicine

## 2020-08-30 NOTE — Telephone Encounter (Signed)
Medication is not on current list.   Ok to refill?

## 2020-09-29 ENCOUNTER — Encounter: Payer: Self-pay | Admitting: Family Medicine

## 2020-09-29 ENCOUNTER — Other Ambulatory Visit: Payer: Self-pay

## 2020-09-29 ENCOUNTER — Ambulatory Visit (INDEPENDENT_AMBULATORY_CARE_PROVIDER_SITE_OTHER): Payer: Medicare Other | Admitting: Family Medicine

## 2020-09-29 VITALS — BP 138/68 | HR 62 | Temp 98.1°F | Resp 14 | Ht 59.0 in | Wt 120.0 lb

## 2020-09-29 DIAGNOSIS — I6523 Occlusion and stenosis of bilateral carotid arteries: Secondary | ICD-10-CM | POA: Diagnosis not present

## 2020-09-29 DIAGNOSIS — M25552 Pain in left hip: Secondary | ICD-10-CM

## 2020-09-29 DIAGNOSIS — G629 Polyneuropathy, unspecified: Secondary | ICD-10-CM | POA: Diagnosis not present

## 2020-09-29 NOTE — Progress Notes (Signed)
Subjective:    Patient ID: Brittany Kirk, female    DOB: [birth year], 75 y.o.   MRN: [id number]  Patient was referred to orthopedics for her left hip pain.  Please see office visit from December.  They performed an MRI of the left hip with the results listed below on January 3: IMPRESSION: 1. Acute nondisplaced fractures of the LEFT pubic root and anterior acetabulum as well as the parasymphyseal aspect of LEFT pubic bone and inferior pubic ramus. 2. Acute nondisplaced intertrochanteric fracture of the proximal RIGHT femur. 3. Acute bilateral sacral alar fractures. 4. Mild left hip osteoarthritis with partial-thickness tear of the anterosuperior labrum. 5. Nonspecific mildly enlarged left external iliac chain lymph nodes measuring up to 10 mm short axis, similar in size to previous CT 01/11/2020, and slightly enlarged compared to 08/13/2017.  Patient has suffered numerous fractures around the inferior pubic ramus, near the pubic symphysis, as well as the left acetabulum.  She is again complaining of a burning pain in her anterior left hip that radiates down to the medial part of her upper thigh.  I reviewed the MRI with her in detail.  I believe that this is likely the cause of her left hip pain as it would mirror the exact location in which she is feeling the hip pain.  However she is also complaining of burning neuropathic pain in her toes at night.  She has palpable dorsalis pedis and posterior tibialis pulses bilaterally.  She reports a burning stinging pain on the plantar aspects of both feet that radiate up the legs.  She is also complaining of a burning stinging pain in her lower back around her tailbone.  Of note she suffered bilateral sacral alla fractures as well. Past Medical History:  Diagnosis Date  . Acid reflux   . Allergic rhinitis   . Anxiety   . Asymptomatic bilateral carotid artery stenosis    40-59% (02/2018)  . Atrial fibrillation ([place]) 07/2019  . Breast cancer ([place])  2015   Right Breast Cancer  . CAD (coronary artery disease)   . Depression   . Emphysema    no longer uses inhalers  . Gallstones    nausea, pain upper right abdomen  . GERD (gastroesophageal reflux disease)   . H/O hiatal hernia   . Hiatal hernia   . History of skin cancer   . Hyperlipidemia   . Hypertension   . Multinodular goiter (nontoxic)   . Scoliosis   . Skin cancer   . Tobacco user   . Wears dentures    top   Past Surgical History:  Procedure Laterality Date  . BREAST LUMPECTOMY Right 2015  . CHOLECYSTECTOMY  07/08/2012   Procedure: LAPAROSCOPIC CHOLECYSTECTOMY WITH INTRAOPERATIVE CHOLANGIOGRAM;  Surgeon: Gayland Curry, MD,FACS;  Location: [facility];  Service: General;  Laterality: N/A;  Laparoscopic Cholecystectomy with Intraoperative Cholangiogram  . COLONOSCOPY    . ESOPHAGOSCOPY N/A 05/29/2017   Procedure: ESOPHAGOSCOPY;  Surgeon: Clarene Essex, MD;  Location: [facility];  Service: Endoscopy;  Laterality: N/A;  botox injection  . HAND SURGERY Left    wrist  . LEFT HEART CATH AND CORONARY ANGIOGRAPHY N/A 10/11/2017   Procedure: LEFT HEART CATH AND CORONARY ANGIOGRAPHY;  Surgeon: Martinique, Peter M, MD;  Location: [facility];  Service: Cardiovascular;  Laterality: N/A;  . TONSILECTOMY, ADENOIDECTOMY, BILATERAL MYRINGOTOMY AND TUBES    . TUBAL LIGATION     Current Outpatient Medications on File Prior to Visit  Medication Sig Dispense  Refill  . ascorbic acid (VITAMIN C) 500 MG tablet Take 500 mg by mouth daily.    Marland Kitchen atorvastatin (LIPITOR) 40 MG tablet TAKE 1 TABLET BY MOUTH EVERYDAY AT BEDTIME (Patient taking differently: Take 40 mg by mouth at bedtime.) 90 tablet 2  . cholecalciferol (VITAMIN D3) 25 MCG (1000 UNIT) tablet Take 1,000 Units by mouth daily.    . Cyanocobalamin (B-12) 2500 MCG TABS Take 2,500 mcg by mouth daily.    Marland Kitchen ELIQUIS 5 MG TABS tablet TAKE 1 TABLET BY MOUTH TWICE A DAY (Patient taking differently: Take 5 mg by mouth 2 (two) times daily.) 180  tablet 1  . omeprazole (PRILOSEC) 20 MG capsule Take 20 mg by mouth daily.    Marland Kitchen zinc gluconate 50 MG tablet Take 50 mg by mouth daily.     No current facility-administered medications on file prior to visit.   Allergies  Allergen Reactions  . Penicillins Anaphylaxis, Itching, Swelling and Rash    Has patient had a PCN reaction causing immediate rash, facial/tongue/throat swelling, SOB or lightheadedness with hypotension: Yes Has patient had a PCN reaction causing severe rash involving mucus membranes or skin necrosis: No Has patient had a PCN reaction that required hospitalization: Yes Has patient had a PCN reaction occurring within the last 10 years: No If all of the above answers are "NO", then may proceed with Cephalosporin use.   . Cefaclor Itching, Swelling and Rash  . Latex Itching and Rash  . Metronidazole Itching, Swelling and Rash  . Naproxen Itching, Swelling and Rash  . Nsaids Itching, Swelling and Rash    Ibuprofen IS tolerated  . Septra [Bactrim] Itching, Swelling and Rash  . Sulfonamide Derivatives Itching, Swelling and Rash  . Tape Itching and Rash   Social History   Socioeconomic History  . Marital status: Married    Spouse name: Not on file  . Number of children: 1  . Years of education: Not on file  . Highest education level: Not on file  Occupational History  . Occupation: retired Clinical cytogeneticist: RETIRED  Tobacco Use  . Smoking status: Former Smoker    Packs/day: 0.50    Years: 46.00    Pack years: 23.00    Types: Cigarettes    Quit date: 02/13/2011    Years since quitting: 9.6  . Smokeless tobacco: Never Used  Vaping Use  . Vaping Use: Never used  Substance and Sexual Activity  . Alcohol use: No  . Drug use: No  . Sexual activity: Not on file  Other Topics Concern  . Not on file  Social History Narrative  . Not on file   Social Determinants of Health   Financial Resource Strain: Low Risk   . Difficulty of Paying Living  Expenses: Not hard at all  Food Insecurity: Not on file  Transportation Needs: Not on file  Physical Activity: Not on file  Stress: Not on file  Social Connections: Not on file  Intimate Partner Violence: Not on file     Review of Systems  Musculoskeletal: Positive for back pain.  All other systems reviewed and are negative.      Objective:   Physical Exam Vitals reviewed.  Constitutional:      General: She is not in acute distress.    Appearance: She is well-developed. She is not diaphoretic.  HENT:     Head: Normocephalic and atraumatic.     Right Ear: External ear normal.  Left Ear: External ear normal.     Mouth/Throat:     Pharynx: No oropharyngeal exudate.  Eyes:     Conjunctiva/sclera: Conjunctivae normal.     Pupils: Pupils are equal, round, and reactive to light.  Neck:     Thyroid: No thyromegaly.     Vascular: No JVD.  Cardiovascular:     Rate and Rhythm: Normal rate and regular rhythm.     Heart sounds: Normal heart sounds. No murmur heard. No friction rub. No gallop.   Pulmonary:     Effort: Pulmonary effort is normal. No respiratory distress.     Breath sounds: Normal breath sounds. No stridor. No wheezing or rales.  Abdominal:     General: Bowel sounds are normal. There is no distension.     Palpations: Abdomen is soft.     Tenderness: There is no abdominal tenderness. There is no guarding.  Musculoskeletal:     Cervical back: Normal range of motion.     Lumbar back: Spasms, tenderness and bony tenderness present. Decreased range of motion. Negative right straight leg raise test and negative left straight leg raise test.     Right hip: No tenderness or bony tenderness. Normal range of motion. Normal strength.     Left hip: Tenderness and bony tenderness present. Normal range of motion. Normal strength.       Legs:  Lymphadenopathy:     Cervical: No cervical adenopathy.  Neurological:     Mental Status: She is alert and oriented to person,  place, and time.     Cranial Nerves: No cranial nerve deficit.     Sensory: No sensory deficit.     Motor: Tremor present. No atrophy, abnormal muscle tone or seizure activity.     Deep Tendon Reflexes: Reflexes are normal and symmetric.           Assessment & Plan:  Left hip pain  Neuropathy I believe her left hip pain is due to the fractures of the inferior pubic ramus as well as the anterior acetabulum.  I believe that this is going require time to improve and I tried to explain that as best I could to the patient.  She has seen orthopedics and they did not recommend surgical intervention.  However also believe that she is dealing with neuropathic pain in her legs.  We discussed options for treatment and I discussed gabapentin.  This carries the risk of sedation and dizziness and I am worried about that and this frail patient.  She would like to discuss this with her cardiologist prior to starting it.  I would recommend using gabapentin 100 mg p.o. nightly and then slowly uptitrating as tolerated to 100 mg 3 times daily and if necessary up to 300 mg 3 times daily as tolerated.

## 2020-10-05 ENCOUNTER — Telehealth: Payer: Medicare Other | Admitting: Nurse Practitioner

## 2020-10-10 ENCOUNTER — Other Ambulatory Visit: Payer: Self-pay | Admitting: Cardiovascular Disease

## 2020-10-11 ENCOUNTER — Telehealth: Payer: Self-pay | Admitting: Pharmacist

## 2020-10-11 NOTE — Progress Notes (Addendum)
Chronic Care Management Pharmacy Assistant   Name: Brittany Kirk  MRN: [id number] DOB: 03/02/1946  Reason for Encounter: Disease State For HTN.    Conditions to be addressed/monitored: HTN, COPD, GERD.  Recent office visits:  09/29/20 Dr. Dennard Schaumann. For Hip pain and Neuropathy. Per note: the doctor recommend using gabapentin 100 mg p.o. nightly and then slowly uptitrating as tolerated to 100 mg 3 times daily and if necessary up to 300 mg 3 times daily as tolerated.No medication changes. 08/04/20 Dr. Dennard Schaumann For orthostatic dizziness. Per note:I have recommended that she start wearing compression hose to improve venous return to the heart and reduce the risk of orthostatic dizziness.  Also recommended that the patient stand up slowly and gradually and allow accommodation prior to starting to walk.  No medication changes.   Recent consult visits:  08/22/20 [facility]. Delton Coombes, NP.  08/15/20 [facility], Doreene Burke, PA-C. For follow-Up. Per note: The doctor recommended that she take metoprolol 12.5 mg. STOPPED Trazodone   Hospital visits:  07/31/20 Shauna Hugh, MD. For Atrial Fibrillation with RVR. Discharged 08/01/20. Per note: Continue to hold metoprolol.   Medications: Outpatient Encounter Medications as of 10/11/2020  Medication Sig   ascorbic acid (VITAMIN C) 500 MG tablet Take 500 mg by mouth daily.   atorvastatin (LIPITOR) 40 MG tablet TAKE 1 TABLET BY MOUTH EVERYDAY AT BEDTIME (Patient taking differently: Take 40 mg by mouth at bedtime.)   cholecalciferol (VITAMIN D3) 25 MCG (1000 UNIT) tablet Take 1,000 Units by mouth daily.   Cyanocobalamin (B-12) 2500 MCG TABS Take 2,500 mcg by mouth daily.   ELIQUIS 5 MG TABS tablet TAKE 1 TABLET BY MOUTH TWICE A DAY   omeprazole (PRILOSEC) 20 MG capsule Take 20 mg by mouth daily.   zinc gluconate 50 MG tablet Take 50 mg by mouth daily.   No facility-administered encounter medications on file as of  10/11/2020.    Reviewed chart prior to disease state call. Spoke with patient regarding BP  Recent Office Vitals: BP Readings from Last 3 Encounters:  09/29/20 138/68  08/15/20 (!) 154/70  08/04/20 (!) 158/78   Pulse Readings from Last 3 Encounters:  09/29/20 62  08/15/20 (!) 57  08/01/20 63    Wt Readings from Last 3 Encounters:  09/29/20 120 lb (54.4 kg)  08/15/20 115 lb 12.8 oz (52.5 kg)  08/04/20 116 lb (52.6 kg)     Kidney Function Lab Results  Component Value Date/Time   CREATININE 0.85 08/01/2020 01:08 AM   CREATININE 0.85 07/31/2020 10:10 AM   CREATININE 0.84 09/29/2019 12:44 PM   CREATININE 0.80 03/27/2019 12:34 PM   CREATININE 1.0 02/25/2017 02:15 PM   CREATININE 1.1 02/23/2016 02:48 PM   GFRNONAA >60 08/01/2020 01:08 AM   GFRNONAA 68 09/29/2019 12:44 PM   GFRAA >60 03/12/2020 10:36 AM   GFRAA 79 09/29/2019 12:44 PM    BMP Latest Ref Rng & Units 08/01/2020 07/31/2020 03/12/2020  Glucose 70 - 99 mg/dL 95 90 147(H)  BUN 8 - 23 mg/dL 18 17 11   Creatinine 0.44 - 1.00 mg/dL 0.85 0.85 0.86  BUN/Creat Ratio 6 - 22 (calc) - - -  Sodium 135 - 145 mmol/L 141 142 139  Potassium 3.5 - 5.1 mmol/L 3.6 3.1(L) 4.0  Chloride 98 - 111 mmol/L 110 108 106  CO2 22 - 32 mmol/L 26 24 24   Calcium 8.9 - 10.3 mg/dL 7.8(L) 8.2(L) 8.5(L)    Current antihypertensive regimen:  Metoprolol Succinate 25 mg Take 0.5 tablets (12.5 mg total) by mouth daily. (Patient stated she takes the medication occasionally)   How often are you checking your Blood Pressure? when feeling symptomatic   Current home BP readings:  113/47 116/47 127/60 115/52  What recent interventions/DTPs have been made by any provider to improve Blood Pressure control since last CPP Visit: None  Any recent hospitalizations or ED visits since last visit with CPP?  07/31/20 Shauna Hugh, MD. For Atrial Fibrillation with RVR. Discharged 08/01/20. Per note: Continue to hold metoprolol.   What diet changes have been  made to improve Blood Pressure Control?  Patient stated she does not eat sodium. She stated she does not drink caffeine anymore.   What exercise is being done to improve your Blood Pressure Control?  Patient stated she does her daily house duties.   Adherence Review: Is the patient currently on ACE/ARB medication? No.  Does the patient have >5 day gap between last estimated fill dates? Per misc rpts, no.   Star Rating Drugs: Atorvastatin 40 mg 90 DS 06/13/20  Follow-Up:Pharmacist Review  Charlann Lange, RMA Clinical Pharmacist Assistant [phone]  10 minutes spent in review, coordination, and documentation.  Reviewed by: Beverly Milch, PharmD Clinical Pharmacist [employer] [facility] [phone]

## 2020-10-31 DIAGNOSIS — D225 Melanocytic nevi of trunk: Secondary | ICD-10-CM | POA: Diagnosis not present

## 2020-10-31 DIAGNOSIS — L821 Other seborrheic keratosis: Secondary | ICD-10-CM | POA: Diagnosis not present

## 2020-10-31 DIAGNOSIS — D1801 Hemangioma of skin and subcutaneous tissue: Secondary | ICD-10-CM | POA: Diagnosis not present

## 2020-10-31 DIAGNOSIS — Z85828 Personal history of other malignant neoplasm of skin: Secondary | ICD-10-CM | POA: Diagnosis not present

## 2020-10-31 DIAGNOSIS — L720 Epidermal cyst: Secondary | ICD-10-CM | POA: Diagnosis not present

## 2020-10-31 DIAGNOSIS — D692 Other nonthrombocytopenic purpura: Secondary | ICD-10-CM | POA: Diagnosis not present

## 2020-10-31 DIAGNOSIS — L570 Actinic keratosis: Secondary | ICD-10-CM | POA: Diagnosis not present

## 2020-11-02 DIAGNOSIS — Z01419 Encounter for gynecological examination (general) (routine) without abnormal findings: Secondary | ICD-10-CM | POA: Diagnosis not present

## 2020-11-02 DIAGNOSIS — N390 Urinary tract infection, site not specified: Secondary | ICD-10-CM | POA: Diagnosis not present

## 2020-11-02 DIAGNOSIS — N760 Acute vaginitis: Secondary | ICD-10-CM | POA: Diagnosis not present

## 2020-11-02 DIAGNOSIS — R309 Painful micturition, unspecified: Secondary | ICD-10-CM | POA: Diagnosis not present

## 2020-11-02 DIAGNOSIS — Z124 Encounter for screening for malignant neoplasm of cervix: Secondary | ICD-10-CM | POA: Diagnosis not present

## 2020-11-08 DIAGNOSIS — H5213 Myopia, bilateral: Secondary | ICD-10-CM | POA: Diagnosis not present

## 2020-11-08 DIAGNOSIS — H25813 Combined forms of age-related cataract, bilateral: Secondary | ICD-10-CM | POA: Diagnosis not present

## 2020-11-08 DIAGNOSIS — H52223 Regular astigmatism, bilateral: Secondary | ICD-10-CM | POA: Diagnosis not present

## 2020-11-08 DIAGNOSIS — H2513 Age-related nuclear cataract, bilateral: Secondary | ICD-10-CM | POA: Diagnosis not present

## 2020-11-11 ENCOUNTER — Telehealth: Payer: Self-pay

## 2020-11-28 ENCOUNTER — Telehealth: Payer: Self-pay | Admitting: Family Medicine

## 2020-11-28 NOTE — Telephone Encounter (Signed)
Left message for patient to call back and schedule Medicare Annual Wellness Visit (AWV) in office.   If not able to come in office, please offer to do virtually or by telephone.   Last AWV: 07/15/2014  Please schedule at anytime with [facility]-Nurse Health Advisor.  If any questions, please contact me at [phone]

## 2020-12-20 DIAGNOSIS — H25013 Cortical age-related cataract, bilateral: Secondary | ICD-10-CM | POA: Diagnosis not present

## 2020-12-20 DIAGNOSIS — H2511 Age-related nuclear cataract, right eye: Secondary | ICD-10-CM | POA: Diagnosis not present

## 2020-12-20 DIAGNOSIS — H2513 Age-related nuclear cataract, bilateral: Secondary | ICD-10-CM | POA: Diagnosis not present

## 2020-12-20 DIAGNOSIS — H25043 Posterior subcapsular polar age-related cataract, bilateral: Secondary | ICD-10-CM | POA: Diagnosis not present

## 2020-12-20 DIAGNOSIS — H18413 Arcus senilis, bilateral: Secondary | ICD-10-CM | POA: Diagnosis not present

## 2020-12-21 ENCOUNTER — Other Ambulatory Visit: Payer: Self-pay | Admitting: Cardiovascular Disease

## 2020-12-21 DIAGNOSIS — Z1231 Encounter for screening mammogram for malignant neoplasm of breast: Secondary | ICD-10-CM

## 2020-12-23 ENCOUNTER — Other Ambulatory Visit: Payer: Self-pay

## 2020-12-23 ENCOUNTER — Ambulatory Visit
Admission: RE | Admit: 2020-12-23 | Discharge: 2020-12-23 | Disposition: A | Discharge status: Home / Self Care | Payer: Medicare Other | Source: Ambulatory Visit | Attending: Cardiovascular Disease | Admitting: Cardiovascular Disease

## 2020-12-23 DIAGNOSIS — Z1231 Encounter for screening mammogram for malignant neoplasm of breast: Secondary | ICD-10-CM | POA: Diagnosis not present

## 2020-12-28 ENCOUNTER — Other Ambulatory Visit (HOSPITAL_COMMUNITY): Payer: Self-pay | Admitting: Family Medicine

## 2020-12-28 DIAGNOSIS — Z1231 Encounter for screening mammogram for malignant neoplasm of breast: Secondary | ICD-10-CM

## 2021-01-11 ENCOUNTER — Telehealth: Payer: Self-pay | Admitting: Cardiovascular Disease

## 2021-01-11 NOTE — Telephone Encounter (Signed)
Pt is calling in with concerns about how she is feeling.She states that she is having pressure in her head and she can hear her heartbeat.She is feeling really worried

## 2021-01-11 NOTE — Telephone Encounter (Signed)
Spoke with patient she has been having episodes of elevated blood pressure at times and dizziness.  Feels like a surge in her head  Can feel her heart beating in her head  Does have shortness of breath when walking up stairs  Dizzy when goes from sitting to standing at times This has been going on for about 2 weeks Scheduled patient an appointment for 8/5 with Blima Ledger NP  Patient aware of appointment

## 2021-01-12 NOTE — Progress Notes (Deleted)
Cardiology Clinic Note   Patient Name: Brittany Kirk Date of Encounter: 01/12/2021  Primary Care Provider:  Susy Frizzle, MD Primary Cardiologist:  [name], MD  Patient Profile     Brittany Kirk 75 year old female presents the clinic today for an evaluation of her dizziness and hypertension.  Past Medical History    Past Medical History:  Diagnosis Date   Acid reflux    Allergic rhinitis    Anxiety    Asymptomatic bilateral carotid artery stenosis    40-59% (02/2018)   Atrial fibrillation ([place]) 07/2019   Breast cancer ([place]) 2015   Right Breast Cancer   CAD (coronary artery disease)    Depression    Emphysema    no longer uses inhalers   Gallstones    nausea, pain upper right abdomen   GERD (gastroesophageal reflux disease)    H/O hiatal hernia    Hiatal hernia    History of skin cancer    Hyperlipidemia    Hypertension    Multinodular goiter (nontoxic)    Scoliosis    Skin cancer    Tobacco user    Wears dentures    top   Past Surgical History:  Procedure Laterality Date   BREAST LUMPECTOMY Right 2015   CHOLECYSTECTOMY  07/08/2012   Procedure: LAPAROSCOPIC CHOLECYSTECTOMY WITH INTRAOPERATIVE CHOLANGIOGRAM;  Surgeon: Gayland Curry, MD,FACS;  Location: [facility];  Service: General;  Laterality: N/A;  Laparoscopic Cholecystectomy with Intraoperative Cholangiogram   COLONOSCOPY     ESOPHAGOSCOPY N/A 05/29/2017   Procedure: ESOPHAGOSCOPY;  Surgeon: Clarene Essex, MD;  Location: [place];  Service: Endoscopy;  Laterality: N/A;  botox injection   HAND SURGERY Left    wrist   LEFT HEART CATH AND CORONARY ANGIOGRAPHY N/A 10/11/2017   Procedure: LEFT HEART CATH AND CORONARY ANGIOGRAPHY;  Surgeon: Martinique, Peter M, MD;  Location: [place] [facility];  Service: Cardiovascular;  Laterality: N/A;   TONSILECTOMY, ADENOIDECTOMY, BILATERAL MYRINGOTOMY AND TUBES     TUBAL LIGATION      Allergies  Allergies  Allergen Reactions   Penicillins Anaphylaxis,  Itching, Swelling and Rash    Has patient had a PCN reaction causing immediate rash, facial/tongue/throat swelling, SOB or lightheadedness with hypotension: Yes Has patient had a PCN reaction causing severe rash involving mucus membranes or skin necrosis: No Has patient had a PCN reaction that required hospitalization: Yes Has patient had a PCN reaction occurring within the last 10 years: No If all of the above answers are "NO", then may proceed with Cephalosporin use.    Cefaclor Itching, Swelling and Rash   Latex Itching and Rash   Metronidazole Itching, Swelling and Rash   Naproxen Itching, Swelling and Rash   Nsaids Itching, Swelling and Rash    Ibuprofen IS tolerated   Septra [Bactrim] Itching, Swelling and Rash   Sulfonamide Derivatives Itching, Swelling and Rash   Tape Itching and Rash    History of Present Illness    [name] has a PMH of CAD, essential hypertension, bilateral carotid artery disease, unstable angina, accelerated hypertension, A. fib with RVR, COPD, GERD, stage II chronic kidney disease, and depression.  She has had a remote cardiac catheterization by Dr. Lia Foyer with no intervention.  She had a negative stress test 01/2016 and again in 12/2016.  She was seen in the cardiology clinic 10/2017 for progressive chest pain and was quickly taken to the Cath Lab.  Her cardiac catheterization showed single-vessel disease involving her ostium of the ramus  intermediate branch, normal LV function, normal LVEDP.  No PCI was performed.  Her catheterization remained unchanged from prior in 2010 of which her ramus branch was untreated due to concern for shifting plaque into the LAD or circumflex with PCI.  Her blood pressure had been intermittently elevated and she was restarted on losartan and hydralazine.  During a virtual visit 03/2019 with Dr. Gwenlyn Found she reported the discontinuation of hydralazine and losartan due to symptomatic hypotension.  Her carotid Dopplers 02/2019 showed moderate  to severe right ICA stenosis and moderate left.   She was admitted to the hospital 07/15/2019 and cardiology was consulted at the request of Dr. Inda Merlin for an evaluation of her new onset atrial fibrillation with RVR.  Heart rate 100-1 40s.  She received IV Lopressor, potassium, and IV heparin which was transitioned to Eliquis 5 mg twice daily.  She converted from atrial fibrillation to normal sinus rhythm spontaneously thank you and was discharged home on Eliquis and low-dose metoprolol.   She presented to the 07/22/2019 and stated she had not had any further episodes of palpitations or irregular heartbeat.  She felt she was slightly fatigued.  However she presented to her PCP on 07/16/2019 and he changed her metoprolol to metoprolol succinate 12.5 mg daily.  She was slowly returning to her normal daily activities.  She was tolerating apixaban well and had no cardiac complaints at the time.  She had noticed slight increased lower extremity edema.  I recommend lower extremity support stockings and gave her the salty 6 sheet.   She was last seen by Kerin Ransom, PA-C on 08/15/2020.  She had been admitted to the hospital overnight 07/31/2020 with recurrent atrial fibrillation.  She had been compliant with her Eliquis and underwent cardioversion.  During clinic follow-up her EKG showed normal sinus rhythm with a rate of 57.  She did report some concerns with sleeping.  Her TSH was normal.  She was noted to have dependent rubor and she denied claudication.  She also denied recurrent episodes of atrial fibrillation.  She contacted the nurse triage line on 01/11/2021 and indicated that she had been having 2 weeks of dizziness and elevated blood pressure.  She presents the clinic today for follow-up evaluation states***  *** denies chest pain, shortness of breath, lower extremity edema, fatigue, palpitations, melena, hematuria, hemoptysis, diaphoresis, weakness, presyncope, syncope, orthopnea, and PND.   Home Medications     Prior to Admission medications   Medication Sig Start Date End Date Taking? Authorizing Provider  ascorbic acid (VITAMIN C) 500 MG tablet Take 500 mg by mouth daily.    [provider]  atorvastatin (LIPITOR) 40 MG tablet TAKE 1 TABLET BY MOUTH EVERYDAY AT BEDTIME Patient taking differently: Take 40 mg by mouth at bedtime. 06/13/20   Susy Frizzle, MD  cholecalciferol (VITAMIN D3) 25 MCG (1000 UNIT) tablet Take 1,000 Units by mouth daily.    [provider]  Cyanocobalamin (B-12) 2500 MCG TABS Take 2,500 mcg by mouth daily.    [provider]  ELIQUIS 5 MG TABS tablet TAKE 1 TABLET BY MOUTH TWICE A DAY 10/10/20   Lorretta Harp, MD  omeprazole (PRILOSEC) 20 MG capsule Take 20 mg by mouth daily. 07/06/19   [provider]  zinc gluconate 50 MG tablet Take 50 mg by mouth daily.    [provider]    Family History    Family History  Problem Relation Age of Onset   Arthritis Mother  Pancreatic cancer Mother    Heart disease Other        5 brothers and 2 sister   Diverticulitis Sister    Heart disease Sister    Aplastic anemia Other    She indicated that her mother is deceased. She indicated that her father is deceased. She indicated that the status of her sister is unknown. She indicated that only one of her two others is alive.  Social History    Social History   Socioeconomic History   Marital status: Married    Spouse name: Not on file   Number of children: 1   Years of education: Not on file   Highest education level: Not on file  Occupational History   Occupation: retired truck Orthoptist: RETIRED  Tobacco Use   Smoking status: Former    Packs/day: 0.50    Years: 46.00    Pack years: 23.00    Types: Cigarettes    Quit date: 02/13/2011    Years since quitting: 9.9   Smokeless tobacco: Never  Vaping Use   Vaping Use: Never used  Substance and Sexual Activity   Alcohol use: No   Drug use: No    Sexual activity: Not on file  Other Topics Concern   Not on file  Social History Narrative   Not on file   Social Determinants of Health   Financial Resource Strain: Not on file  Food Insecurity: Not on file  Transportation Needs: Not on file  Physical Activity: Not on file  Stress: Not on file  Social Connections: Not on file  Intimate Partner Violence: Not on file     Review of Systems    General:  No chills, fever, night sweats or weight changes.  Cardiovascular:  No chest pain, dyspnea on exertion, edema, orthopnea, palpitations, paroxysmal nocturnal dyspnea. Dermatological: No rash, lesions/masses Respiratory: No cough, dyspnea Urologic: No hematuria, dysuria Abdominal:   No nausea, vomiting, diarrhea, bright red blood per rectum, melena, or hematemesis Neurologic:  No visual changes, wkns, changes in mental status. All other systems reviewed and are otherwise negative except as noted above.  Physical Exam    VS:  There were no vitals taken for this visit. , BMI There is no height or weight on file to calculate BMI. GEN: Well nourished, well developed, in no acute distress. HEENT: normal. Neck: Supple, no JVD, carotid bruits, or masses. Cardiac: RRR, no murmurs, rubs, or gallops. No clubbing, cyanosis, edema.  Radials/DP/PT 2+ and equal bilaterally.  Respiratory:  Respirations regular and unlabored, clear to auscultation bilaterally. GI: Soft, nontender, nondistended, BS + x 4. MS: no deformity or atrophy. Skin: warm and dry, no rash. Neuro:  Strength and sensation are intact. Psych: Normal affect.  Accessory Clinical Findings    Recent Labs: 07/31/2020: Hemoglobin 14.3; Platelets 305; TSH 0.450 08/01/2020: ALT 19; BUN 18; Creatinine, Ser 0.85; Potassium 3.6; Sodium 141   Recent Lipid Panel    Component Value Date/Time   CHOL 133 07/15/2019 0934   CHOL 124 02/27/2017 0940   TRIG 48 07/15/2019 0934   HDL 59 07/15/2019 0934   HDL 58 02/27/2017 0940   CHOLHDL  2.3 07/15/2019 0934   VLDL 10 07/15/2019 0934   LDLCALC 64 07/15/2019 0934   LDLCALC 61 07/14/2018 1149    ECG personally reviewed by me today- *** - No acute changes  EKG 07/22/2019 sinus bradycardia cannot rule out anterior infarct undetermined age 40 bpm- No acute changes  EKG 07/16/2019 Atrial fibrillation 103 bpm     Cardiac cath 10/11/2017 Ost LAD lesion is 30% stenosed. Prox LAD lesion is 40% stenosed. Ost Ramus lesion is 85% stenosed. Prox RCA to Mid RCA lesion is 35% stenosed. The left ventricular systolic function is normal. LV end diastolic pressure is normal. The left ventricular ejection fraction is 55-65% by visual estimate.   1. Single vessel obstructive CAD involving the ostium of the ramus intermediate branch. 2. Normal LV function 3. Normal LVEDP   Plan: Cardiac cath is unchanged from prior study in 2010. The Ramus branch was untreated at that time due to concern about shifting plaque into the LAD or LCx with PCI. The patient is currently on no antianginal therapy and is severely hypertensive in the lab. I would recommend aggressive medical therapy and BP control. Will add Coreg and long acting nitrate. If she continues to have refractory angina despite optimal medical therapy and BP control, PCI of the ramus could be considered.   Coronary Diagrams   Diagnostic Dominance: Right          Myoview stress test 01/05/2017 CLinically negative for ischemia No EKG changes with infusion LVEF 83% Low risk  Assessment & Plan   1.  Essential hypertension-BP today ***122/58.  Previously unable to tolerate metoprolol. Heart healthy low-sodium diet Increase physical activity as tolerated Maintain blood pressure log Start valsartan 40 mg daily Order BMP in 1 week   Atrial fibrillation with RVR-heart rate today sinus bradycardia*** 54 bpm.  She was admitted overnight on 07/31/2020.  She had been compliant with her Eliquis and underwent cardioversion.  CHA2DS2-VASc  score 4 (female, age, PAD, HTN) Continue apixaban 5 mg twice daily Avoid triggers caffeine, chocolate, EtOH, OSA, etc.  Lower extremity edema-***generalized lower extremity nonpitting edema.  Has noticed since hospital discharge. Heart healthy low-sodium diet-salty 6 given Wear lower extremity support stockings-[place] support stockings sheet given Elevate lower extremities when not active   Hyperlipidemia-07/15/2019  LDL Cholesterol 64; Triglycerides 48; VLDL 10 Continue atorvastatin  Heart healthy low-sodium high-fiber diet   Coronary artery disease-denies chest pain today.  Underwent cardiac catheterization 2019 showed ostial ramus branch disease with no other significant change from previous cath.  Medical management was recommended Continue atorvastatin    Disposition: Follow-up with Dr. Gwenlyn Found or me in 1-2 months.   Jossie Ng. Aloma Boch NP-C    01/12/2021, 1:07 PM [address] [place] [address] Office [phone] Fax [phone]  Notice: This dictation was prepared with Dragon dictation along with smaller phrase technology. Any transcriptional errors that result from this process are unintentional and may not be corrected upon review.  I spent***minutes examining this patient, reviewing medications, and using patient centered shared decision making involving her cardiac care.  Prior to her visit I spent greater than 20 minutes reviewing her past medical history,  medications, and prior cardiac tests.

## 2021-01-13 ENCOUNTER — Other Ambulatory Visit (HOSPITAL_COMMUNITY): Payer: Self-pay

## 2021-01-13 ENCOUNTER — Observation Stay (HOSPITAL_COMMUNITY)
Admission: EM | Admit: 2021-01-13 | Discharge: 2021-01-13 | Disposition: A | Discharge status: Home / Self Care | Payer: Medicare Other | Attending: Internal Medicine | Admitting: Internal Medicine

## 2021-01-13 ENCOUNTER — Other Ambulatory Visit: Payer: Self-pay

## 2021-01-13 ENCOUNTER — Encounter (HOSPITAL_COMMUNITY): Payer: Self-pay | Admitting: Emergency Medicine

## 2021-01-13 ENCOUNTER — Emergency Department (HOSPITAL_COMMUNITY): Payer: Medicare Other

## 2021-01-13 ENCOUNTER — Other Ambulatory Visit (HOSPITAL_COMMUNITY): Payer: Medicare Other

## 2021-01-13 ENCOUNTER — Ambulatory Visit: Payer: Medicare Other | Admitting: General Practice

## 2021-01-13 ENCOUNTER — Observation Stay (HOSPITAL_BASED_OUTPATIENT_CLINIC_OR_DEPARTMENT_OTHER): Payer: Medicare Other

## 2021-01-13 DIAGNOSIS — Z853 Personal history of malignant neoplasm of breast: Secondary | ICD-10-CM | POA: Diagnosis not present

## 2021-01-13 DIAGNOSIS — Z87891 Personal history of nicotine dependence: Secondary | ICD-10-CM | POA: Diagnosis not present

## 2021-01-13 DIAGNOSIS — I129 Hypertensive chronic kidney disease with stage 1 through stage 4 chronic kidney disease, or unspecified chronic kidney disease: Secondary | ICD-10-CM | POA: Insufficient documentation

## 2021-01-13 DIAGNOSIS — I959 Hypotension, unspecified: Secondary | ICD-10-CM | POA: Diagnosis not present

## 2021-01-13 DIAGNOSIS — R072 Precordial pain: Secondary | ICD-10-CM | POA: Diagnosis not present

## 2021-01-13 DIAGNOSIS — R778 Other specified abnormalities of plasma proteins: Secondary | ICD-10-CM

## 2021-01-13 DIAGNOSIS — I471 Supraventricular tachycardia: Secondary | ICD-10-CM | POA: Diagnosis not present

## 2021-01-13 DIAGNOSIS — I499 Cardiac arrhythmia, unspecified: Secondary | ICD-10-CM | POA: Diagnosis not present

## 2021-01-13 DIAGNOSIS — N182 Chronic kidney disease, stage 2 (mild): Secondary | ICD-10-CM | POA: Diagnosis not present

## 2021-01-13 DIAGNOSIS — I2510 Atherosclerotic heart disease of native coronary artery without angina pectoris: Secondary | ICD-10-CM | POA: Diagnosis not present

## 2021-01-13 DIAGNOSIS — I4891 Unspecified atrial fibrillation: Secondary | ICD-10-CM

## 2021-01-13 DIAGNOSIS — Z7901 Long term (current) use of anticoagulants: Secondary | ICD-10-CM | POA: Diagnosis not present

## 2021-01-13 DIAGNOSIS — I491 Atrial premature depolarization: Secondary | ICD-10-CM | POA: Diagnosis not present

## 2021-01-13 DIAGNOSIS — E785 Hyperlipidemia, unspecified: Secondary | ICD-10-CM

## 2021-01-13 DIAGNOSIS — R000 Tachycardia, unspecified: Secondary | ICD-10-CM | POA: Diagnosis not present

## 2021-01-13 DIAGNOSIS — I700 Atherosclerosis of aorta: Secondary | ICD-10-CM | POA: Diagnosis not present

## 2021-01-13 DIAGNOSIS — I517 Cardiomegaly: Secondary | ICD-10-CM | POA: Diagnosis not present

## 2021-01-13 DIAGNOSIS — Z85828 Personal history of other malignant neoplasm of skin: Secondary | ICD-10-CM | POA: Insufficient documentation

## 2021-01-13 DIAGNOSIS — R079 Chest pain, unspecified: Secondary | ICD-10-CM | POA: Diagnosis present

## 2021-01-13 DIAGNOSIS — R0602 Shortness of breath: Secondary | ICD-10-CM | POA: Diagnosis not present

## 2021-01-13 DIAGNOSIS — R0789 Other chest pain: Secondary | ICD-10-CM | POA: Diagnosis not present

## 2021-01-13 DIAGNOSIS — J449 Chronic obstructive pulmonary disease, unspecified: Secondary | ICD-10-CM | POA: Diagnosis not present

## 2021-01-13 DIAGNOSIS — I4819 Other persistent atrial fibrillation: Secondary | ICD-10-CM | POA: Diagnosis not present

## 2021-01-13 DIAGNOSIS — Z91040 Latex allergy status: Secondary | ICD-10-CM | POA: Diagnosis not present

## 2021-01-13 DIAGNOSIS — I10 Essential (primary) hypertension: Secondary | ICD-10-CM | POA: Diagnosis present

## 2021-01-13 DIAGNOSIS — I480 Paroxysmal atrial fibrillation: Secondary | ICD-10-CM | POA: Insufficient documentation

## 2021-01-13 DIAGNOSIS — I25119 Atherosclerotic heart disease of native coronary artery with unspecified angina pectoris: Secondary | ICD-10-CM

## 2021-01-13 DIAGNOSIS — R031 Nonspecific low blood-pressure reading: Secondary | ICD-10-CM

## 2021-01-13 DIAGNOSIS — Z79899 Other long term (current) drug therapy: Secondary | ICD-10-CM | POA: Insufficient documentation

## 2021-01-13 DIAGNOSIS — K219 Gastro-esophageal reflux disease without esophagitis: Secondary | ICD-10-CM | POA: Diagnosis present

## 2021-01-13 LAB — BASIC METABOLIC PANEL
Anion gap: 9 (ref 5–15)
BUN: 25 mg/dL — ABNORMAL HIGH (ref 8–23)
CO2: 22 mmol/L (ref 22–32)
Calcium: 8.7 mg/dL — ABNORMAL LOW (ref 8.9–10.3)
Chloride: 106 mmol/L (ref 98–111)
Creatinine, Ser: 0.9 mg/dL (ref 0.44–1.00)
GFR, Estimated: >60 mL/min (ref >60)
Glucose, Bld: 115 mg/dL — ABNORMAL HIGH (ref 70–99)
Potassium: 3.6 mmol/L (ref 3.5–5.1)
Sodium: 137 mmol/L (ref 135–145)

## 2021-01-13 LAB — CBC
HCT: 37.2 % (ref 36.0–46.0)
Hemoglobin: 12.4 g/dL (ref 12.0–15.0)
MCH: 33.5 pg (ref 26.0–34.0)
MCHC: 33.3 g/dL (ref 30.0–36.0)
MCV: 100.5 fL — ABNORMAL HIGH (ref 80.0–100.0)
Platelets: 238 10*3/uL (ref 150–400)
RBC: 3.7 MIL/uL — ABNORMAL LOW (ref 3.87–5.11)
RDW: 13.6 % (ref 11.5–15.5)
WBC: 10.1 10*3/uL (ref 4.0–10.5)
nRBC: 0 % (ref 0.0–0.2)

## 2021-01-13 LAB — ECHOCARDIOGRAM COMPLETE
AR max vel: 2.1 cm2
AV Area VTI: 2.27 cm2
AV Area mean vel: 2.22 cm2
AV Mean grad: 3 mmHg
AV Peak grad: 6.4 mmHg
Ao pk vel: 1.26 m/s
Area-P 1/2: 3.42 cm2
Height: 59 in
MV VTI: 1.65 cm2
S' Lateral: 2.8 cm
Weight: 1968 oz

## 2021-01-13 LAB — TROPONIN I (HIGH SENSITIVITY)
Troponin I (High Sensitivity): 9 ng/L (ref <18)
Troponin I (High Sensitivity): 109 ng/L (ref <18)

## 2021-01-13 LAB — TSH: TSH: 0.489 u[IU]/mL (ref 0.350–4.500)

## 2021-01-13 LAB — D-DIMER, QUANTITATIVE: D-Dimer, Quant: <0.27 ug/mL-FEU (ref 0.00–0.50)

## 2021-01-13 MED ORDER — DILTIAZEM HCL 30 MG PO TABS: ORAL_TABLET | ORAL | 1 refills | Status: DC

## 2021-01-13 MED ORDER — ATORVASTATIN CALCIUM 40 MG PO TABS
40.0000 mg | ORAL_TABLET | Freq: Every day | ORAL | Status: DC
  Administered 2021-01-13: 40 mg via ORAL
  Filled 2021-01-13: qty 1

## 2021-01-13 MED ORDER — ACETAMINOPHEN 650 MG RE SUPP: 650.0000 mg | Freq: Four times a day (QID) | RECTAL | Status: DC | PRN

## 2021-01-13 MED ORDER — ACETAMINOPHEN 325 MG PO TABS: 650.0000 mg | ORAL_TABLET | Freq: Four times a day (QID) | ORAL | Status: DC | PRN

## 2021-01-13 MED ORDER — APIXABAN 5 MG PO TABS
5.0000 mg | ORAL_TABLET | Freq: Two times a day (BID) | ORAL | Status: DC
  Administered 2021-01-13: 5 mg via ORAL
  Filled 2021-01-13: qty 1

## 2021-01-13 MED ORDER — ATORVASTATIN CALCIUM 40 MG PO TABS: 40.0000 mg | ORAL_TABLET | Freq: Every day | ORAL | Status: DC

## 2021-01-13 MED ORDER — NITROGLYCERIN 0.4 MG SL SUBL
0.4000 mg | SUBLINGUAL_TABLET | SUBLINGUAL | Status: DC | PRN
Start: 2021-01-13 — End: 2021-01-13

## 2021-01-13 MED ORDER — DILTIAZEM HCL-DEXTROSE 125-5 MG/125ML-% IV SOLN (PREMIX)
5.0000 mg/h | INTRAVENOUS | Status: DC
  Administered 2021-01-13: 5 mg/h via INTRAVENOUS
  Filled 2021-01-13: qty 125

## 2021-01-13 MED ORDER — DILTIAZEM LOAD VIA INFUSION
10.0000 mg | Freq: Once | INTRAVENOUS | Status: AC
  Administered 2021-01-13: 10 mg via INTRAVENOUS
  Filled 2021-01-13: qty 10

## 2021-01-13 MED ORDER — PANTOPRAZOLE SODIUM 40 MG PO TBEC
40.0000 mg | DELAYED_RELEASE_TABLET | Freq: Every day | ORAL | Status: DC
  Administered 2021-01-13: 40 mg via ORAL
  Filled 2021-01-13: qty 1

## 2021-01-13 NOTE — ED Notes (Signed)
Pt heart rhythm appears to have converted to a sinus rhythm. New EKG is being obtained. Dr. Mal Misty informed and awaiting response to potentially DC the diltiazem drip.

## 2021-01-13 NOTE — H&P (Addendum)
History and Physical    Brittany Kirk [id number] DOB: 1946/01/09 DOA: 01/13/2021  PCP: Susy Frizzle, MD Patient coming from: Home  Chief Complaint: Chest pain, palpitations  HPI: Brittany Kirk is a 75 y.o. female with medical history significant of recurrent paroxysmal A. fib on Eliquis, CAD, hypertension, hyperlipidemia, history of breast cancer status postlumpectomy, bilateral carotid artery disease presented to the ED via EMS with with complaints of chest pain and palpitations.  She was given aspirin 324 mg and sublingual nitroglycerin x2 in route.  Found to be in A. fib with rate up to 130s.  Labs showing WBC 10.1, hemoglobin 12.4, platelet count 238.  Sodium 137, potassium 3.6, chloride 106, bicarb 22, BUN 25, creatinine 0.9, glucose 115.  Initial high-sensitivity troponin negative, repeat pending.  Chest x-ray showing no active disease.  Patient states tonight she woke up with sudden onset heart palpitations and chest pain.  She experienced sharp pain in the center of her chest which radiated to her back and both of her arms.  She also felt a little short of breath at that time.  EMS gave her sublingual nitroglycerin which did not help so they had to give her another dose which helped somewhat.  She normally does not experience chest pain with exertion.  Denies history of blood clots.  She takes Eliquis regularly and has not missed any doses.  States she does not take metoprolol as her blood pressure is typically low and heart rate in the 50s.  Denies any fevers or cough.  Denies nausea, vomiting, abdominal pain, diarrhea, or dysuria.  Review of Systems:  All systems reviewed and apart from history of presenting illness, are negative.  Past Medical History:  Diagnosis Date   Acid reflux    Allergic rhinitis    Anxiety    Asymptomatic bilateral carotid artery stenosis    40-59% (02/2018)   Atrial fibrillation ([place]) 07/2019   Breast cancer ([place]) 2015   Right Breast Cancer    CAD (coronary artery disease)    Depression    Emphysema    no longer uses inhalers   Gallstones    nausea, pain upper right abdomen   GERD (gastroesophageal reflux disease)    H/O hiatal hernia    Hiatal hernia    History of skin cancer    Hyperlipidemia    Hypertension    Multinodular goiter (nontoxic)    Scoliosis    Skin cancer    Tobacco user    Wears dentures    top    Past Surgical History:  Procedure Laterality Date   BREAST LUMPECTOMY Right 2015   CHOLECYSTECTOMY  07/08/2012   Procedure: LAPAROSCOPIC CHOLECYSTECTOMY WITH INTRAOPERATIVE CHOLANGIOGRAM;  Surgeon: Gayland Curry, MD,FACS;  Location: [facility];  Service: General;  Laterality: N/A;  Laparoscopic Cholecystectomy with Intraoperative Cholangiogram   COLONOSCOPY     ESOPHAGOSCOPY N/A 05/29/2017   Procedure: ESOPHAGOSCOPY;  Surgeon: Clarene Essex, MD;  Location: [facility];  Service: Endoscopy;  Laterality: N/A;  botox injection   HAND SURGERY Left    wrist   LEFT HEART CATH AND CORONARY ANGIOGRAPHY N/A 10/11/2017   Procedure: LEFT HEART CATH AND CORONARY ANGIOGRAPHY;  Surgeon: Martinique, Peter M, MD;  Location: [facility];  Service: Cardiovascular;  Laterality: N/A;   TONSILECTOMY, ADENOIDECTOMY, BILATERAL MYRINGOTOMY AND TUBES     TUBAL LIGATION       reports that she quit smoking about 9 years ago. Her smoking use included cigarettes. She has a  23.00 pack-year smoking history. She has never used smokeless tobacco. She reports that she does not drink alcohol and does not use drugs.  Allergies  Allergen Reactions   Penicillins Anaphylaxis, Itching, Swelling and Rash    Has patient had a PCN reaction causing immediate rash, facial/tongue/throat swelling, SOB or lightheadedness with hypotension: Yes Has patient had a PCN reaction causing severe rash involving mucus membranes or skin necrosis: No Has patient had a PCN reaction that required hospitalization: Yes Has patient had a PCN reaction occurring within the  last 10 years: No If all of the above answers are "NO", then may proceed with Cephalosporin use.    Cefaclor Itching, Swelling and Rash   Latex Itching and Rash   Metronidazole Itching, Swelling and Rash   Naproxen Itching, Swelling and Rash   Nsaids Itching, Swelling and Rash    Ibuprofen IS tolerated   Septra [Bactrim] Itching, Swelling and Rash   Sulfonamide Derivatives Itching, Swelling and Rash   Tape Itching and Rash    ONLY USE PAPER TAPE    Family History  Problem Relation Age of Onset   Arthritis Mother    Pancreatic cancer Mother    Heart disease Other        5 brothers and 2 sister   Diverticulitis Sister    Heart disease Sister    Aplastic anemia Other     Prior to Admission medications   Medication Sig Start Date End Date Taking? Authorizing Provider  ascorbic acid (VITAMIN C) 500 MG tablet Take 500 mg by mouth daily.    [provider]  atorvastatin (LIPITOR) 40 MG tablet TAKE 1 TABLET BY MOUTH EVERYDAY AT BEDTIME Patient taking differently: Take 40 mg by mouth at bedtime. 06/13/20   Susy Frizzle, MD  cholecalciferol (VITAMIN D3) 25 MCG (1000 UNIT) tablet Take 1,000 Units by mouth daily.    [provider]  Cyanocobalamin (B-12) 2500 MCG TABS Take 2,500 mcg by mouth daily.    [provider]  ELIQUIS 5 MG TABS tablet TAKE 1 TABLET BY MOUTH TWICE A DAY 10/10/20   Lorretta Harp, MD  omeprazole (PRILOSEC) 20 MG capsule Take 20 mg by mouth daily. 07/06/19   [provider]  zinc gluconate 50 MG tablet Take 50 mg by mouth daily.    [provider]    Physical Exam: Vitals:   01/13/21 [id number] 01/13/21 0402 01/13/21 0430 01/13/21 0445  BP: (!) 124/51 (!) 166/70 123/68 (!) 126/54  Pulse: (!) 111 (!) 102 98 (!) 47  Resp: '20 18 10 19  '$ Temp:  98 F (36.7 C)    TempSrc:  Oral    SpO2: 100% 100% 99% 97%    Physical Exam Constitutional:      General: She is not in acute distress. HENT:     Head: Normocephalic and  atraumatic.  Eyes:     Extraocular Movements: Extraocular movements intact.     Conjunctiva/sclera: Conjunctivae normal.  Cardiovascular:     Rate and Rhythm: Tachycardia present. Rhythm irregular.     Pulses: Normal pulses.  Pulmonary:     Effort: Pulmonary effort is normal. No respiratory distress.     Breath sounds: Normal breath sounds. No wheezing or rales.  Abdominal:     General: Bowel sounds are normal. There is no distension.     Palpations: Abdomen is soft.     Tenderness: There is no abdominal tenderness.  Musculoskeletal:        General: No swelling  or tenderness.     Cervical back: Normal range of motion and neck supple.  Skin:    General: Skin is warm and dry.  Neurological:     General: No focal deficit present.     Mental Status: She is alert and oriented to person, place, and time.     Labs on Admission: I have personally reviewed following labs and imaging studies  CBC: Recent Labs  Lab 01/13/21 0222  WBC 10.1  HGB 12.4  HCT 37.2  MCV 100.5*  PLT [id number]   Basic Metabolic Panel: Recent Labs  Lab 01/13/21 0222  NA 137  K 3.6  CL 106  CO2 22  GLUCOSE 115*  BUN 25*  CREATININE 0.90  CALCIUM 8.7*   GFR: CrCl cannot be calculated (Unknown ideal weight.). Liver Function Tests: No results for input(s): AST, ALT, ALKPHOS, BILITOT, PROT, ALBUMIN in the last 168 hours. No results for input(s): LIPASE, AMYLASE in the last 168 hours. No results for input(s): AMMONIA in the last 168 hours. Coagulation Profile: No results for input(s): INR, PROTIME in the last 168 hours. Cardiac Enzymes: No results for input(s): CKTOTAL, CKMB, CKMBINDEX, TROPONINI in the last 168 hours. BNP (last 3 results) No results for input(s): PROBNP in the last 8760 hours. HbA1C: No results for input(s): HGBA1C in the last 72 hours. CBG: No results for input(s): GLUCAP in the last 168 hours. Lipid Profile: No results for input(s): CHOL, HDL, LDLCALC, TRIG, CHOLHDL, LDLDIRECT in  the last 72 hours. Thyroid Function Tests: No results for input(s): TSH, T4TOTAL, FREET4, T3FREE, THYROIDAB in the last 72 hours. Anemia Panel: No results for input(s): VITAMINB12, FOLATE, FERRITIN, TIBC, IRON, RETICCTPCT in the last 72 hours. Urine analysis:    Component Value Date/Time   COLORURINE YELLOW 01/11/2020 0946   APPEARANCEUR Clear 04/05/2020 1412   LABSPEC 1.025 01/11/2020 0946   PHURINE 5.0 01/11/2020 0946   GLUCOSEU Negative 04/05/2020 1412   HGBUR 1+ (A) 01/11/2020 0946   BILIRUBINUR Negative 04/05/2020 1412   KETONESUR TRACE (A) 01/11/2020 0946   PROTEINUR Negative 04/05/2020 1412   PROTEINUR NEGATIVE 01/11/2020 0946   UROBILINOGEN 0.2 10/06/2014 1614   NITRITE Negative 04/05/2020 1412   NITRITE NEGATIVE 01/11/2020 0946   LEUKOCYTESUR Negative 04/05/2020 1412   LEUKOCYTESUR NEGATIVE 01/11/2020 0946    Radiological Exams on Admission: DG Chest Port 1 View  Result Date: 01/13/2021 CLINICAL DATA:  Central chest pain and shortness of breath. EXAM: PORTABLE CHEST 1 VIEW COMPARISON:  Chest radiograph dated 07/31/2020. FINDINGS: No focal consolidation, pleural effusion, or pneumothorax. Mild cardiomegaly. Atherosclerotic calcification of the aortic arch. No acute osseous pathology. Osteopenia with degenerative changes of the spine. Right axillary surgical clips. IMPRESSION: No acute cardiopulmonary process. Electronically Signed   By: Anner Crete M.D.   On: 01/13/2021 02:32    EKG: Independently reviewed.  A. fib with RVR.  Assessment/Plan Principal Problem:   Chest pain Active Problems:   HLD (hyperlipidemia)   Essential hypertension   GERD (gastroesophageal reflux disease)   Atrial fibrillation, rapid (HCC)  A. fib with RVR History of recurrent paroxysmal A. fib and required cardioversion back in February 2022.  She is on Eliquis for anticoagulation but not on any rate or rhythm control agents.  Currently in A. fib with rate ranging between 100-130.  PE less  likely to be precipitating factor given no hypoxia.  She is satting 99-100% on room air. -Cardiac monitoring.  Start IV Cardizem.  Continue Eliquis.  Check D-dimer level.  Check TSH  level.  May need cardioversion if she does not convert to sinus rhythm.  I have messaged cardiology requesting consultation in the morning.  Will order echocardiogram.  Chest pain CAD Possibly related to A. fib with RVR but does have known CAD.  Cath done in 2019 showing single-vessel obstructive CAD.  Initial high-sensitivity troponin negative.  Chest pain subsided after sublingual nitroglycerin x2 by EMS.  PE less likely given no hypoxia. -Cardiac monitoring.  Trend troponin.  Check D-dimer level.  Patient received full dose aspirin prior to arrival.  Cardiology consulted.   Hypertension Stable. -IV Cardizem started for A. fib with RVR  Hyperlipidemia -Continue Lipitor  GERD -Continue PPI  DVT prophylaxis: Eliquis Code Status: Patient wishes to be full code. Family Communication: No family available at this time. Disposition Plan: Status is: Observation  The patient remains OBS appropriate and will d/c before 2 midnights.  Dispo: The patient is from: Home              Anticipated d/c is to: Home              Patient currently is not medically stable to d/c.   Difficult to place patient No  Level of care: Level of care: Telemetry Cardiac  The medical decision making on this patient was of high complexity and the patient is at high risk for clinical deterioration, therefore this is a level 3 visit.  Shela Leff MD [facility]  If 7PM-7AM, please contact night-coverage [email or web address]  01/13/2021, 6:27 AM

## 2021-01-13 NOTE — Progress Notes (Signed)
  Echocardiogram 2D Echocardiogram has been performed.  Brittany Kirk 01/13/2021, 2:08 PM

## 2021-01-13 NOTE — Consult Note (Addendum)
Cardiology Consultation:   Patient ID: Brittany Kirk MRN: [id number]; DOB: [birth year]  Admit date: 01/13/2021 Date of Consult: 01/13/2021  PCP:  Susy Frizzle, MD   [facility] Providers Cardiologist:  [name], MD    Patient Profile:   Brittany Kirk is a 75 y.o. female with a hx of medically managed CAD, paroxysmal atrial fibrillation, hypertension, hyperlipidemia, moderate bilateral carotid artery disease, breast cancer s/p lumpectomy in 2015 who is being seen 01/13/2021 for the evaluation of atrial fibrillation at the request of Dr. Mal Misty.  Cath 10/2017 showed single vessel obstructive CAD involving the ostium of the ramus intermediate branch. This was  unchanged from prior study in 2010. The Ramus branch was untreated at that time due to concern about shifting plaque into the LAD or LCx with PCI.  Medical therapy recommended.  Patient initially diagnosed with atrial fibrillation in February 2021 with spontaneous conversion to sinus rhythm.  She did not tolerated low-dose of metoprolol.  Has sinus bradycardia at baseline.  Admitted February 2022 with recurrent atrial fibrillation.  She was spontaneously converted to sinus on Cardizem and discharge.  History of Present Illness:   Brittany Kirk was in usual state of health up until last night when woke up from sleep around 11:30 PM with chest pain and palpitation.  She described her chest pain as sharp radiating underneath left shoulder and across arm.  She had a severe pounding sensation.  EMS was called.  She was given sublingual nitroglycerin with improvement of chest pain and eventually reevaluation after starting Cardizem, seems with improved heart rate.  Patient spontaneously converted to sinus rhythm on Cardizem at 5 mg/h around 1 PM.  Maintaining sinus rhythm.  Patient reports current episode is similar when she was admitted in February 2022.  High-sensitivity troponin 9>>109 Chest x-ray without acute finding Potassium  3.6 Creatinine normal Hemoglobin 12.4 TSH normal  Echo 01/13/2021 1. Left ventricular ejection fraction, by estimation, is 60 to 65%. The  left ventricle has normal function. The left ventricle has no regional  wall motion abnormalities. Left ventricular diastolic parameters were  normal.   2. Right ventricular systolic function is normal. The right ventricular  size is normal.   3. The mitral valve is normal in structure. No evidence of mitral valve  regurgitation.   4. The aortic valve is normal in structure. Aortic valve regurgitation is  not visualized. No aortic stenosis is present.  Past Medical History:  Diagnosis Date   Acid reflux    Allergic rhinitis    Anxiety    Asymptomatic bilateral carotid artery stenosis    40-59% (02/2018)   Atrial fibrillation ([place]) 07/2019   Breast cancer ([place]) 2015   Right Breast Cancer   CAD (coronary artery disease)    Depression    Emphysema    no longer uses inhalers   Gallstones    nausea, pain upper right abdomen   GERD (gastroesophageal reflux disease)    H/O hiatal hernia    Hiatal hernia    History of skin cancer    Hyperlipidemia    Hypertension    Multinodular goiter (nontoxic)    Scoliosis    Skin cancer    Tobacco user    Wears dentures    top    Past Surgical History:  Procedure Laterality Date   BREAST LUMPECTOMY Right 2015   CHOLECYSTECTOMY  07/08/2012   Procedure: LAPAROSCOPIC CHOLECYSTECTOMY WITH INTRAOPERATIVE CHOLANGIOGRAM;  Surgeon: Gayland Curry, MD,FACS;  Location: [facility];  Service:  General;  Laterality: N/A;  Laparoscopic Cholecystectomy with Intraoperative Cholangiogram   COLONOSCOPY     ESOPHAGOSCOPY N/A 05/29/2017   Procedure: ESOPHAGOSCOPY;  Surgeon: Clarene Essex, MD;  Location: [facility];  Service: Endoscopy;  Laterality: N/A;  botox injection   HAND SURGERY Left    wrist   LEFT HEART CATH AND CORONARY ANGIOGRAPHY N/A 10/11/2017   Procedure: LEFT HEART CATH AND CORONARY ANGIOGRAPHY;  Surgeon:  Martinique, Peter M, MD;  Location: [facility];  Service: Cardiovascular;  Laterality: N/A;   TONSILECTOMY, ADENOIDECTOMY, BILATERAL MYRINGOTOMY AND TUBES     TUBAL LIGATION      Inpatient Medications: Scheduled Meds:  apixaban  5 mg Oral BID   atorvastatin  40 mg Oral Daily   pantoprazole  40 mg Oral Daily   Continuous Infusions:  diltiazem (CARDIZEM) infusion Stopped (01/13/21 1310)   PRN Meds: acetaminophen **OR** acetaminophen  Allergies:    Allergies  Allergen Reactions   Penicillins Anaphylaxis, Itching, Swelling and Rash    Has patient had a PCN reaction causing immediate rash, facial/tongue/throat swelling, SOB or lightheadedness with hypotension: Yes Has patient had a PCN reaction causing severe rash involving mucus membranes or skin necrosis: No Has patient had a PCN reaction that required hospitalization: Yes Has patient had a PCN reaction occurring within the last 10 years: No If all of the above answers are "NO", then may proceed with Cephalosporin use.    Cefaclor Itching, Swelling and Rash   Latex Itching and Rash   Metronidazole Itching, Swelling and Rash   Naproxen Itching, Swelling and Rash   Nsaids Itching, Swelling and Rash    Ibuprofen IS tolerated   Septra [Bactrim] Itching, Swelling and Rash   Sulfonamide Derivatives Itching, Swelling and Rash   Tape Itching and Rash    ONLY USE PAPER TAPE    Social History:   Social History   Socioeconomic History   Marital status: Married    Spouse name: Not on file   Number of children: 1   Years of education: Not on file   Highest education level: Not on file  Occupational History   Occupation: retired truck Orthoptist: RETIRED  Tobacco Use   Smoking status: Former    Packs/day: 0.50    Years: 46.00    Pack years: 23.00    Types: Cigarettes    Quit date: 02/13/2011    Years since quitting: 9.9   Smokeless tobacco: Never  Vaping Use   Vaping Use: Never used  Substance and Sexual  Activity   Alcohol use: No   Drug use: No   Sexual activity: Not on file  Other Topics Concern   Not on file  Social History Narrative   Not on file   Social Determinants of Health   Financial Resource Strain: Not on file  Food Insecurity: Not on file  Transportation Needs: Not on file  Physical Activity: Not on file  Stress: Not on file  Social Connections: Not on file  Intimate Partner Violence: Not on file    Family History:    Family History  Problem Relation Age of Onset   Arthritis Mother    Pancreatic cancer Mother    Heart disease Other        5 brothers and 2 sister   Diverticulitis Sister    Heart disease Sister    Aplastic anemia Other      ROS:  Please see the history of present illness.   All  other ROS reviewed and negative.     Physical Exam/Data:   Vitals:   01/13/21 1215 01/13/21 1230 01/13/21 1300 01/13/21 1400  BP: (!) 119/56 (!) 125/59 (!) 133/50 (!) 135/58  Pulse: 70 63 (!) 56 (!) 59  Resp: '17 15 16 16  '$ Temp:      TempSrc:      SpO2: 100% 100% 100% 100%  Weight:      Height:       No intake or output data in the 24 hours ending 01/13/21 1438 Last 3 Weights 01/13/2021 09/29/2020 08/15/2020  Weight (lbs) 123 lb 120 lb 115 lb 12.8 oz  Weight (kg) 55.792 kg 54.432 kg 52.527 kg     Body mass index is 24.84 kg/m.  General:  Well nourished, well developed, in no acute distress HEENT: normal Lymph: no adenopathy Neck: no JVD Endocrine:  No thryomegaly Vascular: No carotid bruits; FA pulses 2+ bilaterally without bruits  Cardiac:  normal S1, S2; RRR; no murmur  Lungs:  clear to auscultation bilaterally, no wheezing, rhonchi or rales  Abd: soft, nontender, no hepatomegaly  Ext: no edema Musculoskeletal:  No deformities, BUE and BLE strength normal and equal Skin: warm and dry  Neuro:  CNs 2-12 intact, no focal abnormalities noted Psych:  Normal affect   EKG:  The EKG was personally reviewed and demonstrates: Atrial fibrillation at rate of  129 bpm, ST depression in lateral leads Telemetry:  Telemetry was personally reviewed and demonstrates: Sinus rhythm converted from A. fib  Relevant CV Studies:  Echo 10/2017 LEFT HEART CATH AND CORONARY ANGIOGRAPHY    Conclusion    Ost LAD lesion is 30% stenosed. Prox LAD lesion is 40% stenosed. Ost Ramus lesion is 85% stenosed. Prox RCA to Mid RCA lesion is 35% stenosed. The left ventricular systolic function is normal. LV end diastolic pressure is normal. The left ventricular ejection fraction is 55-65% by visual estimate.   1. Single vessel obstructive CAD involving the ostium of the ramus intermediate branch. 2. Normal LV function 3. Normal LVEDP   Plan: Cardiac cath is unchanged from prior study in 2010. The Ramus branch was untreated at that time due to concern about shifting plaque into the LAD or LCx with PCI. The patient is currently on no antianginal therapy and is severely hypertensive in the lab. I would recommend aggressive medical therapy and BP control. Will add Coreg and long acting nitrate. If she continues to have refractory angina despite optimal medical therapy and BP control, PCI of the ramus could be considered.   Diagnostic Dominance: Right      Echo 07/2020 1. Left ventricular ejection fraction, by estimation, is 55 to 60%. The  left ventricle has normal function. The left ventricle has no regional  wall motion abnormalities. There is mild left ventricular hypertrophy.  Left ventricular diastolic parameters  were normal.   2. Right ventricular systolic function is normal. The right ventricular  size is normal.   3. The mitral valve is grossly normal. Trivial mitral valve  regurgitation.   4. The aortic valve is abnormal. Aortic valve regurgitation is not  visualized. Mild aortic valve sclerosis is present, with no evidence of  aortic valve stenosis.  Laboratory Data:  High Sensitivity Troponin:   Recent Labs  Lab 01/13/21 0222 01/13/21 0400   TROPONINIHS 9 109*     Chemistry Recent Labs  Lab 01/13/21 0222  NA 137  K 3.6  CL 106  CO2 22  GLUCOSE 115*  BUN 25*  CREATININE 0.90  CALCIUM 8.7*  GFRNONAA >60  ANIONGAP 9     Hematology Recent Labs  Lab 01/13/21 0222  WBC 10.1  RBC 3.70*  HGB 12.4  HCT 37.2  MCV 100.5*  MCH 33.5  MCHC 33.3  RDW 13.6  PLT 238    DDimer  Recent Labs  Lab 01/13/21 0617  DDIMER <0.27   Radiology/Studies:  DG Chest Port 1 View  Result Date: 01/13/2021 CLINICAL DATA:  Central chest pain and shortness of breath. EXAM: PORTABLE CHEST 1 VIEW COMPARISON:  Chest radiograph dated 07/31/2020. FINDINGS: No focal consolidation, pleural effusion, or pneumothorax. Mild cardiomegaly. Atherosclerotic calcification of the aortic arch. No acute osseous pathology. Osteopenia with degenerative changes of the spine. Right axillary surgical clips. IMPRESSION: No acute cardiopulmonary process. Electronically Signed   By: Anner Crete M.D.   On: 01/13/2021 02:32     Assessment and Plan:   Atrial fibrillation with rapid ventricular rate -Patient reports compliance with Eliquis.  No bleeding issue. -Converted to sinus rhythm spontaneously on Cardizem 5 mg/h -This is her second episode of A. fib RVR in past 6 months.  Similar presentation like in February 2022.  She had a chest pain with rapid rate.  -Fortunately patient has sinus bradycardia at baseline preventing addition of scheduled rate control or rhythm control agent. -Will provide as needed Cardizem for breakthrough palpitation. -May be a good ablation candidate. - Echo with preserved LVEF  2.  Elevated troponin -High-sensitivity troponin 9>>109 -Also has rate related ischemic changes on EKG (this presentation as well as February 2022) -Chest pain improved with nitroglycerin but resolved after better rate control -Patient does have CAD disease as summarized above which was treated medically    Risk Assessment/Risk Scores:   TIMI  Risk Score for Unstable Angina or Non-ST Elevation MI:   The patient's TIMI risk score is 5, which indicates a 26% risk of all cause mortality, new or recurrent myocardial infarction or need for urgent revascularization in the next 14 days.  CHA2DS2-VASc Score = 5  This indicates a 7.2% annual risk of stroke. The patient's score is based upon: CHF History: No HTN History: Yes Diabetes History: No Stroke History: No Vascular Disease History: Yes Age Score: 2 Gender Score: 1   For questions or updates, please contact [place] Please consult [email or web address] for contact info under   Jarrett Soho, [place]  01/13/2021 2:38 PM   Agree with note by Robbie Lis PA-C  Patient well-known to me.  History of CAD involving the ostium of a small ramus branch but otherwise no significant obstructive disease.  She does have PAF on Eliquis oral anticoagulation.  She has infrequent episodes of prolonged A. fib associated with chest pain and ST segment depression.  She developed chest pain last night, spontaneously converted to sinus rhythm in the emergency room.  She does take as needed beta-blocker but her baseline rhythm is sinus bradycardia.  Her troponins are mildly elevated.  She is currently pain-free.  I think her chest pain is tachycardia mediated as are her elevated troponins.  She is stable for discharge home.  Her 2D echo was normal.  We will give her as needed diltiazem 30 mg p.o. to take in the event she has recurrent episode.  She will need referral to an EP cardiologist our practice to discuss the possibility of ablation.  Lorretta Harp, M.D., [place], [facility], [place] [employer] [address] [place], [place]  [address]  [id number] 01/13/2021 5:45 PM

## 2021-01-13 NOTE — ED Provider Notes (Signed)
[facility] EMERGENCY DEPARTMENT Provider Note   CSN: [id number] Arrival date & time: 01/13/21  0145     History Chief Complaint  Patient presents with   Chest Pain    Brittany Kirk is a 75 y.o. female.  Patient is a 75 year old female with history of paroxysmal A. fib, coronary artery disease.  She is presenting today for evaluation of chest pain and rapid heartbeat.  This started this evening while she was asleep.  She woke her husband who in turn called 911.  Patient received aspirin and nitroglycerin by EMS with some relief.  The history is provided by the patient.  Chest Pain Pain location:  Substernal area Pain quality: tightness   Pain radiates to:  Does not radiate Pain severity:  Moderate Onset quality:  Sudden Timing:  Constant Progression:  Worsening Chronicity:  New Relieved by:  Nothing Worsened by:  Nothing Ineffective treatments:  None tried     Past Medical History:  Diagnosis Date   Acid reflux    Allergic rhinitis    Anxiety    Asymptomatic bilateral carotid artery stenosis    40-59% (02/2018)   Atrial fibrillation ([place]) 07/2019   Breast cancer ([place]) 2015   Right Breast Cancer   CAD (coronary artery disease)    Depression    Emphysema    no longer uses inhalers   Gallstones    nausea, pain upper right abdomen   GERD (gastroesophageal reflux disease)    H/O hiatal hernia    Hiatal hernia    History of skin cancer    Hyperlipidemia    Hypertension    Multinodular goiter (nontoxic)    Scoliosis    Skin cancer    Tobacco user    Wears dentures    top    Patient Active Problem List   Diagnosis Date Noted   Atrial fibrillation with rapid ventricular response ([place]) 07/31/2020   Upper respiratory tract infection 06/09/2020   Atrial fibrillation with RVR ([place]) 07/15/2019   Multinodular goiter (nontoxic)    Asymptomatic bilateral carotid artery stenosis    History of depression 02/14/2018   History of breast cancer  02/14/2018   Accelerated hypertension 02/14/2018   Unstable angina ([place]) 10/11/2017   Caloric malnutrition ([place]) 08/16/2017   Weight loss, unintentional 03/12/2017   GERD (gastroesophageal reflux disease) 08/10/2016   Cough 08/10/2016   Acute bronchitis 07/12/2016   Bilateral carotid artery disease ([place]) 02/14/2016   Chest pain with moderate risk for cardiac etiology 01/13/2016   Osteoporosis 02/22/2015   Stage 2 chronic kidney disease due to benign hypertension 08/16/2014   Malignant neoplasm of upper-outer quadrant of right breast in female, estrogen receptor positive ([place]) 03/22/2014   Dizziness and giddiness 11/27/2012   Essential tremor 11/27/2012   Numbness 11/27/2012   TOBACCO USER 10/04/2009   CLOSED FRACTURE OF ONE RIB 07/12/2009   Dyslipidemia, goal LDL below 70 09/28/2008   CAD in native artery 09/28/2008   Rhinitis 09/09/2008   COPD (chronic obstructive pulmonary disease) ([place]) 09/07/2008   Essential hypertension 09/06/2008   SKIN CANCER, HX OF 09/06/2008    Past Surgical History:  Procedure Laterality Date   BREAST LUMPECTOMY Right 2015   CHOLECYSTECTOMY  07/08/2012   Procedure: LAPAROSCOPIC CHOLECYSTECTOMY WITH INTRAOPERATIVE CHOLANGIOGRAM;  Surgeon: Gayland Curry, MD,FACS;  Location: [facility];  Service: General;  Laterality: N/A;  Laparoscopic Cholecystectomy with Intraoperative Cholangiogram   COLONOSCOPY     ESOPHAGOSCOPY N/A 05/29/2017   Procedure: ESOPHAGOSCOPY;  Surgeon: Clarene Essex,  MD;  Location: [facility];  Service: Endoscopy;  Laterality: N/A;  botox injection   HAND SURGERY Left    wrist   LEFT HEART CATH AND CORONARY ANGIOGRAPHY N/A 10/11/2017   Procedure: LEFT HEART CATH AND CORONARY ANGIOGRAPHY;  Surgeon: Martinique, Peter M, MD;  Location: [facility];  Service: Cardiovascular;  Laterality: N/A;   TONSILECTOMY, ADENOIDECTOMY, BILATERAL MYRINGOTOMY AND TUBES     TUBAL LIGATION       OB History   No obstetric history on file.     Family  History  Problem Relation Age of Onset   Arthritis Mother    Pancreatic cancer Mother    Heart disease Other        5 brothers and 2 sister   Diverticulitis Sister    Heart disease Sister    Aplastic anemia Other     Social History   Tobacco Use   Smoking status: Former    Packs/day: 0.50    Years: 46.00    Pack years: 23.00    Types: Cigarettes    Quit date: 02/13/2011    Years since quitting: 9.9   Smokeless tobacco: Never  Vaping Use   Vaping Use: Never used  Substance Use Topics   Alcohol use: No   Drug use: No    Home Medications Prior to Admission medications   Medication Sig Start Date End Date Taking? Authorizing Provider  ascorbic acid (VITAMIN C) 500 MG tablet Take 500 mg by mouth daily.    [provider]  atorvastatin (LIPITOR) 40 MG tablet TAKE 1 TABLET BY MOUTH EVERYDAY AT BEDTIME Patient taking differently: Take 40 mg by mouth at bedtime. 06/13/20   Susy Frizzle, MD  cholecalciferol (VITAMIN D3) 25 MCG (1000 UNIT) tablet Take 1,000 Units by mouth daily.    [provider]  Cyanocobalamin (B-12) 2500 MCG TABS Take 2,500 mcg by mouth daily.    [provider]  ELIQUIS 5 MG TABS tablet TAKE 1 TABLET BY MOUTH TWICE A DAY 10/10/20   Lorretta Harp, MD  omeprazole (PRILOSEC) 20 MG capsule Take 20 mg by mouth daily. 07/06/19   [provider]  zinc gluconate 50 MG tablet Take 50 mg by mouth daily.    [provider]    Allergies    Penicillins, Cefaclor, Latex, Metronidazole, Naproxen, Nsaids, Septra [bactrim], Sulfonamide derivatives, and Tape  Review of Systems   Review of Systems  Cardiovascular:  Positive for chest pain.  All other systems reviewed and are negative.  Physical Exam Updated Vital Signs BP (!) 124/51   Pulse (!) 111   Temp 98.5 F (36.9 C)   Resp 20   SpO2 100%   Physical Exam Vitals and nursing note reviewed.  Constitutional:      General: She is not in acute distress.     Appearance: She is well-developed. She is not diaphoretic.  HENT:     Head: Normocephalic and atraumatic.  Cardiovascular:     Rate and Rhythm: Tachycardia present. Rhythm irregular.     Heart sounds: No murmur heard.   No friction rub. No gallop.  Pulmonary:     Effort: Pulmonary effort is normal. No respiratory distress.     Breath sounds: Normal breath sounds. No wheezing.  Abdominal:     General: Bowel sounds are normal. There is no distension.     Palpations: Abdomen is soft.     Tenderness: There is no abdominal tenderness.  Musculoskeletal:  General: Normal range of motion.     Cervical back: Normal range of motion and neck supple.     Right lower leg: No tenderness. No edema.     Left lower leg: No tenderness. No edema.  Skin:    General: Skin is warm and dry.  Neurological:     General: No focal deficit present.     Mental Status: She is alert and oriented to person, place, and time.    ED Results / Procedures / Treatments   Labs (all labs ordered are listed, but only abnormal results are displayed) Labs Reviewed  BASIC METABOLIC PANEL  CBC  TROPONIN I (HIGH SENSITIVITY)    EKG None  Radiology DG Chest Port 1 View  Result Date: 01/13/2021 CLINICAL DATA:  Central chest pain and shortness of breath. EXAM: PORTABLE CHEST 1 VIEW COMPARISON:  Chest radiograph dated 07/31/2020. FINDINGS: No focal consolidation, pleural effusion, or pneumothorax. Mild cardiomegaly. Atherosclerotic calcification of the aortic arch. No acute osseous pathology. Osteopenia with degenerative changes of the spine. Right axillary surgical clips. IMPRESSION: No acute cardiopulmonary process. Electronically Signed   By: Anner Crete M.D.   On: 01/13/2021 02:32    Procedures Procedures   Medications Ordered in ED Medications - No data to display  ED Course  I have reviewed the triage vital signs and the nursing notes.  Pertinent labs & imaging results that were available during my  care of the patient were reviewed by me and considered in my medical decision making (see chart for details).    MDM Rules/Calculators/A&P  Patient is a 75 year old female with history of atrial fibrillation presenting with complaints of chest pain and palpitations.  She arrives here in A. fib with RVR.  She also has had a heart cath several years ago showing multiple blockages.  EKG shows no ischemic changes and initial troponin is negative.  At this point, patient care discussed with Dr. Marlowe Sax from the hospitalist service.  She will admit.  Final Clinical Impression(s) / ED Diagnoses Final diagnoses:  Blood pressure abnormally low    Rx / DC Orders ED Discharge Orders     None        Veryl Speak, MD 01/13/21 [phone]

## 2021-01-13 NOTE — ED Notes (Signed)
CRITICAL VALUE STICKER  CRITICAL VALUE:  RECEIVER (on-site recipient of call):  [place] NOTIFIED: [id number] 01/13/21  MESSENGER (representative from lab):LaB  MD NOTIFIED: [place]  TIME OF NOTIFICATION:0752

## 2021-01-13 NOTE — Discharge Summary (Signed)
Physician Discharge Summary  Brittany Kirk [id number] DOB: 1945-09-29 DOA: 01/13/2021  PCP: Susy Frizzle, MD  Admit date: 01/13/2021 Discharge date: 01/13/2021  Discharge disposition: Home   Recommendations for Outpatient Follow-Up:   Follow up with PCP in 1 week Follow up with cardiologist a s scheduled.   Discharge Diagnosis:   Principal Problem:   Chest pain Active Problems:   HLD (hyperlipidemia)   Essential hypertension   GERD (gastroesophageal reflux disease)   Atrial fibrillation, rapid ([place])    Discharge Condition: Stable.  Diet recommendation:  Diet Order             Diet NPO time specified  Diet effective now           Diet - low sodium heart healthy                     Code Status: Full Code     Hospital Course:   Brittany Kirk is a 75 y.o. female with medical history significant of recurrent paroxysmal A. fib on Eliquis, CAD, hypertension, hyperlipidemia, history of breast cancer status postlumpectomy, bilateral carotid artery disease, who presented to the hospital because of chest pain and palpitations.  She was found to have atrial fibrillation with rapid ventricular response.  Troponin was also mildly elevated likely from demand ischemia.  She was treated with IV Cardizem infusion.  She converted to sinus bradycardia.  Her symptoms have resolved.  She was evaluated by the cardiologist.  2D echo showed EF estimated at 60 to 65%.  From cardiology standpoint, patient is okay for discharge.    Medical Consultants:   Cardiologist   Discharge Exam:    Vitals:   01/13/21 1215 01/13/21 1230 01/13/21 1300 01/13/21 1400  BP: (!) 119/56 (!) 125/59 (!) 133/50 (!) 135/58  Pulse: 70 63 (!) 56 (!) 59  Resp: '17 15 16 16  '$ Temp:      TempSrc:      SpO2: 100% 100% 100% 100%  Weight:      Height:         GEN: NAD SKIN: No rash EYES: EOMI ENT: MMM CV: RRR PULM: CTA B ABD: soft, ND, NT, +BS CNS: AAO x 3, non focal EXT: No  edema or tenderness   The results of significant diagnostics from this hospitalization (including imaging, microbiology, ancillary and laboratory) are listed below for reference.     Procedures and Diagnostic Studies:   DG Chest Port 1 View  Result Date: 01/13/2021 CLINICAL DATA:  Central chest pain and shortness of breath. EXAM: PORTABLE CHEST 1 VIEW COMPARISON:  Chest radiograph dated 07/31/2020. FINDINGS: No focal consolidation, pleural effusion, or pneumothorax. Mild cardiomegaly. Atherosclerotic calcification of the aortic arch. No acute osseous pathology. Osteopenia with degenerative changes of the spine. Right axillary surgical clips. IMPRESSION: No acute cardiopulmonary process. Electronically Signed   By: Anner Crete M.D.   On: 01/13/2021 02:32   ECHOCARDIOGRAM COMPLETE  Result Date: 01/13/2021    ECHOCARDIOGRAM REPORT   Patient Name:   Brittany Kirk Date of Exam: 01/13/2021 Medical Rec #:  [id number]        Height:       59.0 in Accession #:    [id number]       Weight:       123.0 lb Date of Birth:  Jan 10, 1946         BSA:          1.500 m Patient Age:  75 years         BP:           133/50 mmHg Patient Gender: F                HR:           59 bpm. Exam Location:  Inpatient Procedure: 2D Echo, Cardiac Doppler and Color Doppler Indications:    Atrial fibrillation  History:        Patient has prior history of Echocardiogram examinations, most                 recent 07/31/2020. CAD, Arrythmias:Atrial Fibrillation,                 Signs/Symptoms:Chest Pain; Risk Factors:Hypertension and                 Diabetes. Breast cancer s/p lumpectomy. Palpitations.  Sonographer:    Clayton Lefort RDCS (AE) Referring Phys: [id number] [place]  1. Left ventricular ejection fraction, by estimation, is 60 to 65%. The left ventricle has normal function. The left ventricle has no regional wall motion abnormalities. Left ventricular diastolic parameters were normal.  2. Right ventricular  systolic function is normal. The right ventricular size is normal.  3. The mitral valve is normal in structure. No evidence of mitral valve regurgitation.  4. The aortic valve is normal in structure. Aortic valve regurgitation is not visualized. No aortic stenosis is present. FINDINGS  Left Ventricle: Left ventricular ejection fraction, by estimation, is 60 to 65%. The left ventricle has normal function. The left ventricle has no regional wall motion abnormalities. The left ventricular internal cavity size was normal in size. There is  no left ventricular hypertrophy. Left ventricular diastolic parameters were normal. Right Ventricle: The right ventricular size is normal. Right vetricular wall thickness was not well visualized. Right ventricular systolic function is normal. Left Atrium: Left atrial size was normal in size. Right Atrium: Right atrial size was normal in size. Pericardium: The pericardium was not well visualized. Mitral Valve: The mitral valve is normal in structure. No evidence of mitral valve regurgitation. MV peak gradient, 6.6 mmHg. The mean mitral valve gradient is 2.0 mmHg. Tricuspid Valve: The tricuspid valve is normal in structure. Tricuspid valve regurgitation is trivial. Aortic Valve: The aortic valve is normal in structure. Aortic valve regurgitation is not visualized. No aortic stenosis is present. Aortic valve mean gradient measures 3.0 mmHg. Aortic valve peak gradient measures 6.4 mmHg. Aortic valve area, by VTI measures 2.27 cm. Pulmonic Valve: The pulmonic valve was normal in structure. Pulmonic valve regurgitation is trivial. Aorta: The aortic root and ascending aorta are structurally normal, with no evidence of dilitation. IAS/Shunts: The atrial septum is grossly normal.  LEFT VENTRICLE PLAX 2D LVIDd:         4.20 cm  Diastology LVIDs:         2.80 cm  LV e' medial:    8.38 cm/s LV PW:         1.00 cm  LV E/e' medial:  12.4 LV IVS:        1.00 cm  LV e' lateral:   9.36 cm/s LVOT  diam:     1.80 cm  LV E/e' lateral: 11.1 LV SV:         61 LV SV Index:   41 LVOT Area:     2.54 cm  RIGHT VENTRICLE             IVC  RV Basal diam:  2.80 cm     IVC diam: 2.30 cm RV S prime:     13.60 cm/s TAPSE (M-mode): 1.9 cm LEFT ATRIUM             Index       RIGHT ATRIUM           Index LA diam:        2.20 cm 1.47 cm/m  RA Area:     14.60 cm LA Vol (A2C):   39.1 ml 26.07 ml/m RA Volume:   39.20 ml  26.13 ml/m LA Vol (A4C):   24.9 ml 16.60 ml/m LA Biplane Vol: 32.7 ml 21.80 ml/m  AORTIC VALVE AV Area (Vmax):    2.10 cm AV Area (Vmean):   2.22 cm AV Area (VTI):     2.27 cm AV Vmax:           126.00 cm/s AV Vmean:          75.600 cm/s AV VTI:            0.270 m AV Peak Grad:      6.4 mmHg AV Mean Grad:      3.0 mmHg LVOT Vmax:         104.00 cm/s LVOT Vmean:        66.000 cm/s LVOT VTI:          0.241 m LVOT/AV VTI ratio: 0.89  AORTA Ao Root diam: 3.40 cm Ao Asc diam:  3.00 cm MITRAL VALVE MV Area (PHT): 3.42 cm     SHUNTS MV Area VTI:   1.65 cm     Systemic VTI:  0.24 m MV Peak grad:  6.6 mmHg     Systemic Diam: 1.80 cm MV Mean grad:  2.0 mmHg MV Vmax:       1.28 m/s MV Vmean:      64.1 cm/s MV Decel Time: 222 msec MV E velocity: 104.00 cm/s MV A velocity: 101.00 cm/s MV E/A ratio:  1.03 Mertie Moores MD Electronically signed by Mertie Moores MD Signature Date/Time: 01/13/2021/2:40:40 PM    Final      Labs:   Basic Metabolic Panel: Recent Labs  Lab 01/13/21 0222  NA 137  K 3.6  CL 106  CO2 22  GLUCOSE 115*  BUN 25*  CREATININE 0.90  CALCIUM 8.7*   GFR Estimated Creatinine Clearance: 39.8 mL/min (by C-G formula based on SCr of 0.9 mg/dL). Liver Function Tests: No results for input(s): AST, ALT, ALKPHOS, BILITOT, PROT, ALBUMIN in the last 168 hours. No results for input(s): LIPASE, AMYLASE in the last 168 hours. No results for input(s): AMMONIA in the last 168 hours. Coagulation profile No results for input(s): INR, PROTIME in the last 168 hours.  CBC: Recent Labs  Lab  01/13/21 0222  WBC 10.1  HGB 12.4  HCT 37.2  MCV 100.5*  PLT 238   Cardiac Enzymes: No results for input(s): CKTOTAL, CKMB, CKMBINDEX, TROPONINI in the last 168 hours. BNP: Invalid input(s): POCBNP CBG: No results for input(s): GLUCAP in the last 168 hours. D-Dimer Recent Labs    01/13/21 0617  DDIMER <0.27   Hgb A1c No results for input(s): HGBA1C in the last 72 hours. Lipid Profile No results for input(s): CHOL, HDL, LDLCALC, TRIG, CHOLHDL, LDLDIRECT in the last 72 hours. Thyroid function studies Recent Labs    01/13/21 0622  TSH 0.489   Anemia work up No results for input(s): VITAMINB12, FOLATE, FERRITIN, TIBC, IRON, RETICCTPCT in  the last 72 hours. Microbiology No results found for this or any previous visit (from the past 240 hour(s)).   Discharge Instructions:   Discharge Instructions     Amb referral to AFIB Clinic   Complete by: As directed    Diet - low sodium heart healthy   Complete by: As directed    Increase activity slowly   Complete by: As directed       Allergies as of 01/13/2021       Reactions   Penicillins Anaphylaxis, Itching, Swelling, Rash   Has patient had a PCN reaction causing immediate rash, facial/tongue/throat swelling, SOB or lightheadedness with hypotension: Yes Has patient had a PCN reaction causing severe rash involving mucus membranes or skin necrosis: No Has patient had a PCN reaction that required hospitalization: Yes Has patient had a PCN reaction occurring within the last 10 years: No If all of the above answers are "NO", then may proceed with Cephalosporin use.   Cefaclor Itching, Swelling, Rash   Latex Itching, Rash   Metronidazole Itching, Swelling, Rash   Naproxen Itching, Swelling, Rash   Nsaids Itching, Swelling, Rash   Ibuprofen IS tolerated   Septra [bactrim] Itching, Swelling, Rash   Sulfonamide Derivatives Itching, Swelling, Rash   Tape Itching, Rash   ONLY USE PAPER TAPE        Medication List      TAKE these medications    ascorbic acid 500 MG tablet Commonly known as: VITAMIN C Take 500 mg by mouth daily.   atorvastatin 40 MG tablet Commonly known as: LIPITOR Take 1 tablet (40 mg total) by mouth daily. 3 pm What changed: See the new instructions.   B-12 2500 MCG Tabs Take 2,500 mcg by mouth daily.   cholecalciferol 25 MCG (1000 UNIT) tablet Commonly known as: VITAMIN D3 Take 1,000 Units by mouth daily.   diazepam 5 MG tablet Commonly known as: VALIUM Take 5 mg by mouth daily as needed.   diltiazem 30 MG tablet Commonly known as: Cardizem Take 1 table as needed for palpitations. Can take 2nd table after 3 hours if persistent palpitations.   Durezol 0.05 % Emul Generic drug: Difluprednate Place 1 drop into the right eye.   Eliquis 5 MG Tabs tablet Generic drug: apixaban TAKE 1 TABLET BY MOUTH TWICE A DAY What changed: how much to take   melatonin 5 MG Tabs Take 5 mg by mouth at bedtime as needed (sleep).   metoprolol succinate 25 MG 24 hr tablet Commonly known as: TOPROL-XL Take 12.5 mg by mouth at bedtime as needed (high blood pressure).   moxifloxacin 0.5 % ophthalmic solution Commonly known as: VIGAMOX Place 1 drop into the right eye 4 (four) times daily.   omeprazole 20 MG capsule Commonly known as: PRILOSEC Take 20 mg by mouth every evening.   zinc gluconate 50 MG tablet Take 50 mg by mouth daily.        Follow-up Information     Constance Haw, MD Follow up.   Specialty: Cardiology Why: 02/21/21 @ 11:00AM to discuss atrial fibrillation management strategies Contact information: [address] [place] [address] [phone]                  Time coordinating discharge: 33 minutes  Signed:  Jennye Boroughs  [facility] 01/13/2021, 3:13 PM   Pager on [email or web address]. If 7PM-7AM, please contact night-coverage at [email or web address]

## 2021-01-13 NOTE — ED Notes (Signed)
Provider at bedside

## 2021-01-13 NOTE — ED Notes (Signed)
Cardiology PA at bedside. Pt is now in sinus rhythm. dilt stopped.

## 2021-01-13 NOTE — ED Notes (Signed)
ECHO being performed at bedside.

## 2021-01-13 NOTE — ED Notes (Signed)
Received verbal report from Kelly M RN at this time 

## 2021-01-13 NOTE — ED Triage Notes (Signed)
Patient arrived with EMS from home reports central chest pain with SOB this evening , pain radiating to left shoulder , no emesis or diaphoresis . She received 324 ASA and 2 NTG sl by EMS .

## 2021-01-25 ENCOUNTER — Telehealth: Payer: Self-pay | Admitting: Pharmacist

## 2021-01-25 NOTE — Progress Notes (Addendum)
Chronic Care Management Pharmacy Assistant   Name: Brittany Kirk  MRN: [id number] DOB: Oct 25, 1945  Reason for Encounter: Disease State For HTN.    Conditions to be addressed/monitored: HTN, COPD, GERD.  Recent office visits:  None since 10/11/20  Recent consult visits:  11/02/20 Marcina Millard For urinary tract infection   Hospital visits:  01/13/21 [facility] Emergency Department (14 Hours) For Afib. STARTED Diltiazem HCL 30 g MODIFIED Atorvastatin 40 mg daily at 3 pm. EKG, Lab work preformed.   Medications: Outpatient Encounter Medications as of 01/25/2021  Medication Sig Note   ascorbic acid (VITAMIN C) 500 MG tablet Take 500 mg by mouth daily.    atorvastatin (LIPITOR) 40 MG tablet Take 1 tablet (40 mg total) by mouth daily. 3 pm    cholecalciferol (VITAMIN D3) 25 MCG (1000 UNIT) tablet Take 1,000 Units by mouth daily.    Cyanocobalamin (B-12) 2500 MCG TABS Take 2,500 mcg by mouth daily.    diazepam (VALIUM) 5 MG tablet Take 5 mg by mouth daily as needed.    diltiazem (CARDIZEM) 30 MG tablet Take 1 table as needed for palpitations. Can take 2nd table after 3 hours if persistent palpitations.    DUREZOL 0.05 % EMUL Place 1 drop into the right eye. 01/13/2021: Surgery scheduled for the end of August   ELIQUIS 5 MG TABS tablet TAKE 1 TABLET BY MOUTH TWICE A DAY    melatonin 5 MG TABS Take 5 mg by mouth at bedtime as needed (sleep).    metoprolol succinate (TOPROL-XL) 25 MG 24 hr tablet Take 12.5 mg by mouth at bedtime as needed (high blood pressure).    moxifloxacin (VIGAMOX) 0.5 % ophthalmic solution Place 1 drop into the right eye 4 (four) times daily. 01/13/2021: Surgery scheduled for the end of August   omeprazole (PRILOSEC) 20 MG capsule Take 20 mg by mouth every evening.    zinc gluconate 50 MG tablet Take 50 mg by mouth daily.    No facility-administered encounter medications on file as of 01/25/2021.    Reviewed chart prior to disease state call.  Spoke with patient regarding BP  Recent Office Vitals: BP Readings from Last 3 Encounters:  01/13/21 (!) 131/55  09/29/20 138/68  08/15/20 (!) 154/70   Pulse Readings from Last 3 Encounters:  01/13/21 66  09/29/20 62  08/15/20 (!) 57    Wt Readings from Last 3 Encounters:  01/13/21 123 lb (55.8 kg)  09/29/20 120 lb (54.4 kg)  08/15/20 115 lb 12.8 oz (52.5 kg)     Kidney Function Lab Results  Component Value Date/Time   CREATININE 0.90 01/13/2021 02:22 AM   CREATININE 0.85 08/01/2020 01:08 AM   CREATININE 0.84 09/29/2019 12:44 PM   CREATININE 0.80 03/27/2019 12:34 PM   CREATININE 1.0 02/25/2017 02:15 PM   CREATININE 1.1 02/23/2016 02:48 PM   GFRNONAA >60 01/13/2021 02:22 AM   GFRNONAA 68 09/29/2019 12:44 PM   GFRAA >60 03/12/2020 10:36 AM   GFRAA 79 09/29/2019 12:44 PM    BMP Latest Ref Rng & Units 01/13/2021 08/01/2020 07/31/2020  Glucose 70 - 99 mg/dL 115(H) 95 90  BUN 8 - 23 mg/dL 25(H) 18 17  Creatinine 0.44 - 1.00 mg/dL 0.90 0.85 0.85  BUN/Creat Ratio 6 - 22 (calc) - - -  Sodium 135 - 145 mmol/L 137 141 142  Potassium 3.5 - 5.1 mmol/L 3.6 3.6 3.1(L)  Chloride 98 - 111 mmol/L 106 110 108  CO2 22 -  32 mmol/L '22 26 24  '$ Calcium 8.9 - 10.3 mg/dL 8.7(L) 7.8(L) 8.2(L)    Current antihypertensive regimen:  Metoprolol Succinate 25 mg Take 0.5 tablets (12.5 mg total) by mouth daily.  (Patient stated she takes the medication occasionally)   How often are you checking your Blood Pressure? Patient stated twice daily and sometimes more then that.   Current home BP readings: Patient stated her blood pressure continues to fluctuate, patient gave me some of her most recent blood pressure readings 101/37, 116/44,157/64,100/41,171/69, 157/64.  What recent interventions/DTPs have been made by any provider to improve Blood Pressure control since last CPP Visit: None.  Any recent hospitalizations or ED visits since last visit with CPP? Yes, documented above.   What diet changes  have been made to improve Blood Pressure Control?  Patient stated she still is not using sodium.   What exercise is being done to improve your Blood Pressure Control?  Patient stated she does her basic every day things around the house.   Adherence Review: Is the patient currently on ACE/ARB medication? N/A.   Does the patient have >5 day gap between last estimated fill dates? N/A.  Star Rating Drugs: Atorvastatin 40 mg 90 DS 12/15/20.   Patient stated she only has taken her Metoprolol twice since she's left the hospital on 01/13/21. Suggested she speak with the pharmacist and schedule appointment with him but she stated she was pretty busy with other appointments and give her a call later and see if we could come up with a day and time.   Follow-Up:Pharmacist Review Charlann Lange, RMA Clinical Pharmacist Assistant [phone]  10 minutes spent in review, coordination, and documentation.  Reviewed by: Beverly Milch, PharmD Clinical Pharmacist [phone]

## 2021-02-10 DIAGNOSIS — H2512 Age-related nuclear cataract, left eye: Secondary | ICD-10-CM | POA: Diagnosis not present

## 2021-02-10 DIAGNOSIS — H2511 Age-related nuclear cataract, right eye: Secondary | ICD-10-CM | POA: Diagnosis not present

## 2021-02-15 ENCOUNTER — Telehealth: Payer: Self-pay

## 2021-02-15 MED ORDER — TRAZODONE HCL 50 MG PO TABS: 25.0000 mg | ORAL_TABLET | Freq: Every evening | ORAL | 3 refills | Status: DC | PRN

## 2021-02-15 NOTE — Telephone Encounter (Signed)
Pt called in requesting a refill of traZODone (DESYREL) tablet 25-50 mg. Please advise.  Cb#: [phone]

## 2021-02-15 NOTE — Telephone Encounter (Signed)
Prescription sent to pharmacy.

## 2021-02-21 ENCOUNTER — Ambulatory Visit (INDEPENDENT_AMBULATORY_CARE_PROVIDER_SITE_OTHER): Payer: Medicare Other | Admitting: Cardiology

## 2021-02-21 ENCOUNTER — Encounter: Payer: Self-pay | Admitting: Cardiology

## 2021-02-21 ENCOUNTER — Other Ambulatory Visit: Payer: Self-pay

## 2021-02-21 VITALS — BP 150/78 | HR 52 | Ht 59.0 in | Wt 126.0 lb

## 2021-02-21 DIAGNOSIS — Z01818 Encounter for other preprocedural examination: Secondary | ICD-10-CM | POA: Diagnosis not present

## 2021-02-21 DIAGNOSIS — I480 Paroxysmal atrial fibrillation: Secondary | ICD-10-CM | POA: Diagnosis not present

## 2021-02-21 DIAGNOSIS — Z01812 Encounter for preprocedural laboratory examination: Secondary | ICD-10-CM

## 2021-02-21 NOTE — Patient Instructions (Signed)
Medication Instructions:  Your physician recommends that you continue on your current medications as directed. Please refer to the Current Medication list given to you today.  *If you need a refill on your cardiac medications before your next appointment, please call your pharmacy*   Lab Work: Pre procedure labs - see instructions below  If you have labs (blood work) drawn today and your tests are completely normal, you will receive your results only by: [place] (if you have MyChart) OR A paper copy in the mail If you have any lab test that is abnormal or we need to change your treatment, we will call you to review the results.   Testing/Procedures: Your physician has requested that you have cardiac CT within 7 days PRIOR to your ablation. Cardiac computed tomography (CT) is a painless test that uses an x-ray machine to take clear, detailed pictures of your heart.  Please follow instruction below located under "other instructions". You will get a call from our office to schedule the date for this test.  Your physician has recommended that you have an ablation. Catheter ablation is a medical procedure used to treat some cardiac arrhythmias (irregular heartbeats). During catheter ablation, a long, thin, flexible tube is put into a blood vessel in your groin (upper thigh), or neck. This tube is called an ablation catheter. It is then guided to your heart through the blood vessel. Radio frequency waves destroy small areas of heart tissue where abnormal heartbeats may cause an arrhythmia to start. Please follow instruction below located under "other instructions".   Follow-Up: At [facility], you and your health needs are our priority.  As part of our continuing mission to provide you with exceptional heart care, we have created designated Provider Care Teams.  These Care Teams include your primary Cardiologist (physician) and Advanced Practice Providers (APPs -  Physician Assistants and  Nurse Practitioners) who all work together to provide you with the care you need, when you need it.   Your next appointment:   1 month(s) after your ablation  The format for your next appointment:   In Person  Provider:   AFib clinic   Thank you for choosing [facility]!!   Trinidad Curet, RN [phone]    Other Instructions   CT INSTRUCTIONS Your cardiac CT will be scheduled at:  [facility] [address] [place], [place] [address] [phone]  Please arrive at the [facility] main entrance (entrance A) of [facility] 30 minutes prior to test start time. Proceed to the [facility] Radiology Department (first floor) to check-in and test prep.   Please follow these instructions carefully (unless otherwise directed):   On the Night Before the Test: Be sure to Drink plenty of water. Do not consume any caffeinated/decaffeinated beverages or chocolate 12 hours prior to your test. Do not take any antihistamines 12 hours prior to your test.  On the Day of the Test: Drink plenty of water until 1 hour prior to the test. Do not eat any food 4 hours prior to the test. You may take your regular medications prior to the test.  HOLD Furosemide/Hydrochlorothiazide morning of the test. FEMALES- please wear underwire-free bra if available      After the Test: Drink plenty of water. After receiving IV contrast, you may experience a mild flushed feeling. This is normal. On occasion, you may experience a mild rash up to 24 hours after the test. This is not dangerous. If this occurs,  you can take Benadryl 25 mg and increase your fluid intake. If you experience trouble breathing, this can be serious. If it is severe call 911 IMMEDIATELY. If it is mild, please call our office. If you take any of these medications: Glipizide/Metformin, Avandament, Glucavance, please do not take 48 hours after completing test unless otherwise instructed.   Once we have  confirmed authorization from your insurance company, we will call you to set up a date and time for your test. Based on how quickly your insurance processes prior authorizations requests, please allow up to 4 weeks to be contacted for scheduling your Cardiac CT appointment. Be advised that routine Cardiac CT appointments could be scheduled as many as 8 weeks after your provider has ordered it.  For non-scheduling related questions, please contact the cardiac imaging nurse navigator should you have any questions/concerns: Marchia Bond, Cardiac Imaging Nurse Navigator Gordy Clement, Cardiac Imaging Nurse Navigator [facility] Direct Office Dial: [phone]   For scheduling needs, including cancellations and rescheduling, please call [place], [phone].     Electrophysiology/Ablation Procedure Instructions   You are scheduled for a(n)  ablation on 04/14/2021 with Dr. Allegra Lai.   1.   Pre procedure testing-             A.  LAB WORK --- On 03/31/2020 for your pre procedure blood work.  You do NOT need to be fasting.  You can stop by the office anytime between 7:30 am - 4:30 pm   On the day of your procedure 04/14/2021 you will go to [facility] hospital ([address]) at 5:30 am.  Dennis Bast will go to the main entrance A [employer]) and enter where the [employer] are.  Your driver will drop you off and you will head down the hallway to ADMITTING.  You may have one support person come in to the hospital with you.  They will be asked to wait in the waiting room. It is OK to have someone drop you off and come back when you are ready to be discharged.   3.   Do not eat or drink after midnight prior to your procedure.   4.   On the morning of your procedure do NOT take any medication. Do not miss any doses of your blood thinner prior to the morning of your procedure or your procedure will need to be rescheduled.   5.  Plan for an overnight stay but you  may be discharged after your procedure, if you use your phone frequently bring your phone charger. If you are discharged after your procedure you will need someone to drive you home and be with you for 24 hours after your procedure.   6. You will follow up with the AFIB clinic 4 weeks after your procedure.  You will follow up with Dr. Curt Bears  3 months after your procedure.  These appointments will be made for you.   7. FYI: For your safety, and to allow [place] to monitor your vital signs accurately during the surgery/procedure we request that if you have artificial nails, gel coating, SNS etc. Please have those removed prior to your surgery/procedure. Not having the nail coverings /polish removed may result in cancellation or delay of your surgery/procedure.  * If you have ANY questions please call the office [phone] and ask for Felesia Stahlecker RN or send me a MyChart message   * Occasionally, EP Studies and ablations can become lengthy.  Please  make your family aware of this before your procedure starts.  Average time ranges from 2-8 hours for EP studies/ablations.  Your physician will call your family after the procedure with the results.                                    Cardiac Ablation Cardiac ablation is a procedure to destroy (ablate) some heart tissue that is sending bad signals. These bad signals cause problems in heart rhythm. The heart has many areas that make these signals. If there are problems in these areas, they can make the heart beat in a way that is not normal. Destroying some tissues can help make the heart rhythm normal. Tell your doctor about: Any allergies you have. All medicines you are taking. These include vitamins, herbs, eye drops, creams, and over-the-counter medicines. Any problems you or family members have had with medicines that make you fall asleep (anesthetics). Any blood disorders you have. Any surgeries you have had. Any medical conditions you have, such as  kidney failure. Whether you are pregnant or may be pregnant. What are the risks? This is a safe procedure. But problems may occur, including: Infection. Bruising and bleeding. Bleeding into the chest. Stroke or blood clots. Damage to nearby areas of your body. Allergies to medicines or dyes. The need for a pacemaker if the normal system is damaged. Failure of the procedure to treat the problem. What happens before the procedure? Medicines Ask your doctor about: Changing or stopping your normal medicines. This is important. Taking aspirin and ibuprofen. Do not take these medicines unless your doctor tells you to take them. Taking other medicines, vitamins, herbs, and supplements. General instructions Follow instructions from your doctor about what you cannot eat or drink. Plan to have someone take you home from the hospital or clinic. If you will be going home right after the procedure, plan to have someone with you for 24 hours. Ask your doctor what steps will be taken to prevent infection. What happens during the procedure?  An IV tube will be put into one of your veins. You will be given a medicine to help you relax. The skin on your neck or groin will be numbed. A cut (incision) will be made in your neck or groin. A needle will be put through your cut and into a large vein. A tube (catheter) will be put into the needle. The tube will be moved to your heart. Dye may be put through the tube. This helps your doctor see your heart. Small devices (electrodes) on the tube will send out signals. A type of energy will be used to destroy some heart tissue. The tube will be taken out. Pressure will be held on your cut. This helps stop bleeding. A bandage will be put over your cut. The exact procedure may vary among doctors and hospitals. What happens after the procedure? You will be watched until you leave the hospital or clinic. This includes checking your heart rate, breathing rate,  oxygen, and blood pressure. Your cut will be watched for bleeding. You will need to lie still for a few hours. Do not drive for 24 hours or as long as your doctor tells you. Summary Cardiac ablation is a procedure to destroy some heart tissue. This is done to treat heart rhythm problems. Tell your doctor about any medical conditions you may have. Tell him or her about all medicines  you are taking to treat them. This is a safe procedure. But problems may occur. These include infection, bruising, bleeding, and damage to nearby areas of your body. Follow what your doctor tells you about food and drink. You may also be told to change or stop some of your medicines. After the procedure, do not drive for 24 hours or as long as your doctor tells you. This information is not intended to replace advice given to you by your health care provider. Make sure you discuss any questions you have with your health care provider. Document Revised: 04/30/2019 Document Reviewed: 04/30/2019 Elsevier Patient Education  2022 [employer].

## 2021-02-21 NOTE — Progress Notes (Signed)
Electrophysiology Office Note   Date:  02/21/2021   ID:  Brittany Kirk, DOB July 27, 1945, MRN [id number]  PCP:  Susy Frizzle, MD  Cardiologist:  Gwenlyn Found Primary Electrophysiologist:  Arnav Cregg Brittany Leeds, MD    Chief Complaint: AF   History of Present Illness: Brittany Kirk is a 75 y.o. female who is being seen today for the evaluation of AF at the request of Susy Frizzle, MD. Presenting today for electrophysiology evaluation.  She has a history significant for coronary artery disease, paroxysmal atrial fibrillation, hypertension, hyperlipidemia, carotid artery disease, breast cancer status postlumpectomy in 2015.  Catheterization 2019 showed single-vessel obstructive coronary disease in the ostium of the ramus.  Medical therapy was recommended.  She presented to the hospital August 2022 with atrial fibrillation.  She awoke from sleep with chest pain and palpitations.  She spontaneously converted to sinus rhythm.  Today, she denies symptoms of palpitations, chest pain, shortness of breath, orthopnea, PND, lower extremity edema, claudication, dizziness, presyncope, syncope, bleeding, or neurologic sequela. The patient is tolerating medications without difficulties.  She has atrial fibrillation on a near daily basis.  She feels weak and fatigued when she is in atrial fibrillation.  She does not worry too much about her A. fib when her heart rates are less than 80, but at times it gets into the 120s to 130s and she feels quite poorly.  She has had to go to the emergency room at times.   Past Medical History:  Diagnosis Date   Acid reflux    Allergic rhinitis    Anxiety    Asymptomatic bilateral carotid artery stenosis    40-59% (02/2018)   Atrial fibrillation ([place]) 07/2019   Breast cancer ([place]) 2015   Right Breast Cancer   CAD (coronary artery disease)    Depression    Emphysema    no longer uses inhalers   Gallstones    nausea, pain upper right abdomen   GERD  (gastroesophageal reflux disease)    H/O hiatal hernia    Hiatal hernia    History of skin cancer    Hyperlipidemia    Hypertension    Multinodular goiter (nontoxic)    Scoliosis    Skin cancer    Tobacco user    Wears dentures    top   Past Surgical History:  Procedure Laterality Date   BREAST LUMPECTOMY Right 2015   CHOLECYSTECTOMY  07/08/2012   Procedure: LAPAROSCOPIC CHOLECYSTECTOMY WITH INTRAOPERATIVE CHOLANGIOGRAM;  Surgeon: Gayland Curry, MD,FACS;  Location: [facility];  Service: General;  Laterality: N/A;  Laparoscopic Cholecystectomy with Intraoperative Cholangiogram   COLONOSCOPY     ESOPHAGOSCOPY N/A 05/29/2017   Procedure: ESOPHAGOSCOPY;  Surgeon: Clarene Essex, MD;  Location: [facility];  Service: Endoscopy;  Laterality: N/A;  botox injection   HAND SURGERY Left    wrist   LEFT HEART CATH AND CORONARY ANGIOGRAPHY N/A 10/11/2017   Procedure: LEFT HEART CATH AND CORONARY ANGIOGRAPHY;  Surgeon: Martinique, Peter M, MD;  Location: [facility];  Service: Cardiovascular;  Laterality: N/A;   TONSILECTOMY, ADENOIDECTOMY, BILATERAL MYRINGOTOMY AND TUBES     TUBAL LIGATION       Current Outpatient Medications  Medication Sig Dispense Refill   ascorbic acid (VITAMIN C) 500 MG tablet Take 500 mg by mouth daily.     atorvastatin (LIPITOR) 40 MG tablet Take 1 tablet (40 mg total) by mouth daily. 3 pm     cholecalciferol (VITAMIN D3) 25 MCG (1000 UNIT) tablet  Take 1,000 Units by mouth daily.     Cyanocobalamin (B-12) 2500 MCG TABS Take 2,500 mcg by mouth daily.     diazepam (VALIUM) 5 MG tablet Take 5 mg by mouth daily as needed.     diltiazem (CARDIZEM) 30 MG tablet Take 1 table as needed for palpitations. Can take 2nd table after 3 hours if persistent palpitations. 30 tablet 1   DUREZOL 0.05 % EMUL Place 1 drop into the right eye.     ELIQUIS 5 MG TABS tablet TAKE 1 TABLET BY MOUTH TWICE A DAY 180 tablet 1   melatonin 5 MG TABS Take 5 mg by mouth at bedtime as needed (sleep).      metoprolol succinate (TOPROL-XL) 25 MG 24 hr tablet Take 12.5 mg by mouth at bedtime as needed (high blood pressure).     moxifloxacin (VIGAMOX) 0.5 % ophthalmic solution Place 1 drop into the right eye 4 (four) times daily.     omeprazole (PRILOSEC) 20 MG capsule Take 20 mg by mouth every evening.     traZODone (DESYREL) 50 MG tablet Take 0.5-1 tablets (25-50 mg total) by mouth at bedtime as needed. for sleep 90 tablet 3   zinc gluconate 50 MG tablet Take 50 mg by mouth daily.     No current facility-administered medications for this visit.    Allergies:   Penicillins, Cefaclor, Latex, Metronidazole, Naproxen, Nsaids, Septra [bactrim], Sulfonamide derivatives, and Tape   Social History:  The patient  reports that she quit smoking about 10 years ago. Her smoking use included cigarettes. She has a 23.00 pack-year smoking history. She has never used smokeless tobacco. She reports that she does not drink alcohol and does not use drugs.   Family History:  The patient's family history includes Aplastic anemia in an other family member; Arthritis in her mother; Diverticulitis in her sister; Heart disease in her sister and another family member; Pancreatic cancer in her mother.    ROS:  Please see the history of present illness.   Otherwise, review of systems is positive for none.   All other systems are reviewed and negative.    PHYSICAL EXAM: VS:  BP (!) 150/78   Pulse (!) 52   Ht '4\' 11"'$  (1.499 m)   Wt 126 lb (57.2 kg)   SpO2 96%   BMI 25.45 kg/m  , BMI Body mass index is 25.45 kg/m. GEN: Well nourished, well developed, in no acute distress  HEENT: normal  Neck: no JVD, carotid bruits, or masses Cardiac: RRR; no murmurs, rubs, or gallops,no edema  Respiratory:  clear to auscultation bilaterally, normal work of breathing GI: soft, nontender, nondistended, + BS MS: no deformity or atrophy  Skin: warm and dry Neuro:  Strength and sensation are intact Psych: euthymic mood, full  affect  EKG:  EKG is ordered today. Personal review of the ekg ordered shows sinus rhythm, rate 52, PVC  Recent Labs: 08/01/2020: ALT 19 01/13/2021: BUN 25; Creatinine, Ser 0.90; Hemoglobin 12.4; Platelets 238; Potassium 3.6; Sodium 137; TSH 0.489    Lipid Panel     Component Value Date/Time   CHOL 133 07/15/2019 0934   CHOL 124 02/27/2017 0940   TRIG 48 07/15/2019 0934   HDL 59 07/15/2019 0934   HDL 58 02/27/2017 0940   CHOLHDL 2.3 07/15/2019 0934   VLDL 10 07/15/2019 0934   LDLCALC 64 07/15/2019 0934   LDLCALC 61 07/14/2018 1149     Wt Readings from Last 3 Encounters:  02/21/21 126 lb (  57.2 kg)  01/13/21 123 lb (55.8 kg)  09/29/20 120 lb (54.4 kg)      Other studies Reviewed: Additional studies/ records that were reviewed today include: TTE 01/13/21  Review of the above records today demonstrates:    1. Left ventricular ejection fraction, by estimation, is 60 to 65%. The  left ventricle has normal function. The left ventricle has no regional  wall motion abnormalities. Left ventricular diastolic parameters were  normal.   2. Right ventricular systolic function is normal. The right ventricular  size is normal.   3. The mitral valve is normal in structure. No evidence of mitral valve  regurgitation.   4. The aortic valve is normal in structure. Aortic valve regurgitation is  not visualized. No aortic stenosis is present.    ASSESSMENT AND PLAN:  1.  Paroxysmal atrial fibrillation: Currently on Eliquis 5 mg twice daily.  CHA2DS2-VASc of 5.  We discussed further options for her atrial fibrillation including ablation versus medication management.  She would like to try to avoid medications as she is on quite a few like to try and avoid side effects.  Due to that, we Sande Pickert plan for ablation.  Risk, benefits, and alternatives to EP study and radiofrequency ablation for afib were also discussed in detail today. These risks include but are not limited to stroke, bleeding,  vascular damage, tamponade, perforation, damage to the esophagus, lungs, and other structures, pulmonary vein stenosis, worsening renal function, and death. The patient understands these risk and wishes to proceed.  We Saliyah Gillin therefore proceed with catheter ablation at the next available time.  Carto, ICE, anesthesia are requested for the procedure.  Rubena Roseman also obtain CT PV protocol prior to the procedure to exclude LAA thrombus and further evaluate atrial anatomy.   2.  Coronary artery disease: Disease in the ramus.  Continue medical management per primary cardiology.  3.  Hypertension: Elevated today.  Usually well controlled.  No changes.  Case discussed with primary cardiology  Current medicines are reviewed at length with the patient today.   The patient does not have concerns regarding her medicines.  The following changes were made today:  none  Labs/ tests ordered today include:  Orders Placed This Encounter  Procedures   CT CARDIAC MORPH/PULM VEIN W/CM&W/O CA SCORE   Basic metabolic panel   CBC   EKG 12-Lead     Disposition:   FU with Yessika Otte 3 months  Signed, Brittany Laine Brittany Leeds, MD  02/21/2021 11:44 AM     [facility] [address] [place] [address] [phone] (office) [phone] (fax)

## 2021-02-24 DIAGNOSIS — H2512 Age-related nuclear cataract, left eye: Secondary | ICD-10-CM | POA: Diagnosis not present

## 2021-02-28 ENCOUNTER — Other Ambulatory Visit (HOSPITAL_COMMUNITY): Payer: Self-pay | Admitting: Cardiovascular Disease

## 2021-02-28 ENCOUNTER — Other Ambulatory Visit: Payer: Self-pay

## 2021-02-28 ENCOUNTER — Ambulatory Visit (HOSPITAL_COMMUNITY)
Admission: RE | Admit: 2021-02-28 | Discharge: 2021-02-28 | Disposition: A | Discharge status: Home / Self Care | Payer: Medicare Other | Source: Ambulatory Visit | Attending: Cardiology | Admitting: Cardiology

## 2021-02-28 DIAGNOSIS — I6523 Occlusion and stenosis of bilateral carotid arteries: Secondary | ICD-10-CM

## 2021-03-07 ENCOUNTER — Ambulatory Visit (INDEPENDENT_AMBULATORY_CARE_PROVIDER_SITE_OTHER): Payer: Medicare Other | Admitting: Cardiovascular Disease

## 2021-03-07 ENCOUNTER — Encounter: Payer: Self-pay | Admitting: Cardiovascular Disease

## 2021-03-07 ENCOUNTER — Other Ambulatory Visit: Payer: Self-pay

## 2021-03-07 DIAGNOSIS — I10 Essential (primary) hypertension: Secondary | ICD-10-CM | POA: Diagnosis not present

## 2021-03-07 DIAGNOSIS — E782 Mixed hyperlipidemia: Secondary | ICD-10-CM

## 2021-03-07 DIAGNOSIS — I6523 Occlusion and stenosis of bilateral carotid arteries: Secondary | ICD-10-CM | POA: Diagnosis not present

## 2021-03-07 DIAGNOSIS — I4891 Unspecified atrial fibrillation: Secondary | ICD-10-CM | POA: Diagnosis not present

## 2021-03-07 DIAGNOSIS — I2510 Atherosclerotic heart disease of native coronary artery without angina pectoris: Secondary | ICD-10-CM

## 2021-03-07 NOTE — Assessment & Plan Note (Signed)
History of bilateral ICA stenosis by recent carotid Doppler performed 03/01/2021.  This will be repeated on an annual basis.

## 2021-03-07 NOTE — Assessment & Plan Note (Signed)
History of CAD status post catheterization performed by Dr. Martinique 10/11/2017 revealing ostial ramus disease was unchanged compared to prior cath.  She is asymptomatic.

## 2021-03-07 NOTE — Assessment & Plan Note (Signed)
History of hyperlipidemia on statin therapy with lipid profile performed 07/15/2019 revealing total cholesterol 133, LDL 64 HDL 59.

## 2021-03-07 NOTE — Assessment & Plan Note (Signed)
History of PAF maintaining sinus rhythm on Eliquis oral anticoagulation.  She is scheduled for A. fib ablation by Dr. Curt Bears in November.

## 2021-03-07 NOTE — Assessment & Plan Note (Addendum)
History of essential hypertension a blood pressure measured today 170/80.  I did review her blood pressure log and for the most part she is under good control except for occasional blood pressure spikes.  She is on metoprolol.  Blood pressure measured at the end of visit was 142/82.

## 2021-03-07 NOTE — Patient Instructions (Signed)

## 2021-03-07 NOTE — Progress Notes (Signed)
03/07/2021 Brittany Kirk   01-05-1946  [id number]  Primary Physician Pickard, Cammie Mcgee, MD Primary Cardiologist: Lorretta Harp MD [name], [place]  HPI:  Brittany Kirk is a 75 y.o.  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 She was referred by Dr. Dennard Schaumann for cardiovascular evaluation because of chest pain. I last saw her in the office 03/15/2020.  Risk factors include 25 pack years of tobacco abuse having quit 3 years ago. She has treated hypertension as well as a strong family history of heart disease with multiple siblings who died at a premature age of myocardial infarction's. She has never had a heart attack or stroke. She has had a remote cardiac catheterization by Dr. Lia Foyer but no intervention was performed. She was complaining of some substernal chest pain with chronic nausea. This has since some sided after beginning on a proton pump inhibitor. She had a negative stress test 01/24/16 and again during a recent consultation for chest pain 01/05/17. She has had upper and lower endoscopy for abdominal pain and nausea which was unrevealing.   She had diagnostic coronary angiography performed by Dr. Martinique 5/19 showing moderately severe ramus branch ostial disease with noncritical disease otherwise a normal LV function.  This was unchanged from prior cath performed in 2010 it was thought that her chest pain was noncardiac in nature.   She was seen in the emergency room on 07/15/2019 for A. fib with RVR.  She converted spontaneously to sinus rhythm and was begun on apixaban by Dr. Harrell Gave.  She has had a few episodes of brief AF since but for the most part is asymptomatic from this.     Since I saw her in the office a year ago she was admitted in early August with A. fib with RVR and converted.  Her troponins were mildly elevated.  Her 2D echo was normal.  She has  had no recurrent episodes.  She did see Dr. Curt Bears in the office several weeks ago who has scheduled her for A. fib ablation in November.   Current Meds  Medication Sig   ascorbic acid (VITAMIN C) 500 MG tablet Take 500 mg by mouth daily.   atorvastatin (LIPITOR) 40 MG tablet Take 1 tablet (40 mg total) by mouth daily. 3 pm   cholecalciferol (VITAMIN D3) 25 MCG (1000 UNIT) tablet Take 1,000 Units by mouth daily.   Cyanocobalamin (B-12) 2500 MCG TABS Take 2,500 mcg by mouth daily.   diazepam (VALIUM) 5 MG tablet Take 5 mg by mouth daily as needed.   diltiazem (CARDIZEM) 30 MG tablet Take 1 table as needed for palpitations. Can take 2nd table after 3 hours if persistent palpitations.   ELIQUIS 5 MG TABS tablet TAKE 1 TABLET BY MOUTH TWICE A DAY   metoprolol succinate (TOPROL-XL) 25 MG 24 hr tablet Take 12.5 mg by mouth at bedtime as needed (high blood pressure).   omeprazole (PRILOSEC) 20 MG capsule Take 20 mg by mouth every evening.   zinc gluconate 50 MG tablet Take 50 mg by mouth daily.     Allergies  Allergen Reactions   Penicillins Anaphylaxis, Itching, Swelling and Rash    Has patient had a PCN reaction causing immediate rash, facial/tongue/throat swelling, SOB or lightheadedness with hypotension: Yes Has patient had a PCN reaction causing severe rash involving mucus membranes or skin necrosis: No Has patient had  a PCN reaction that required hospitalization: Yes Has patient had a PCN reaction occurring within the last 10 years: No If all of the above answers are "NO", then may proceed with Cephalosporin use.    Cefaclor Itching, Swelling and Rash   Latex Itching and Rash   Metronidazole Itching, Swelling and Rash   Naproxen Itching, Swelling and Rash   Nsaids Itching, Swelling and Rash    Ibuprofen IS tolerated   Septra [Bactrim] Itching, Swelling and Rash   Sulfonamide Derivatives Itching, Swelling and Rash   Tape Itching and Rash    ONLY USE PAPER TAPE    Social History    Socioeconomic History   Marital status: Married    Spouse name: Not on file   Number of children: 1   Years of education: Not on file   Highest education level: Not on file  Occupational History   Occupation: retired truck Orthoptist: RETIRED  Tobacco Use   Smoking status: Former    Packs/day: 0.50    Years: 46.00    Pack years: 23.00    Types: Cigarettes    Quit date: 02/13/2011    Years since quitting: 10.0   Smokeless tobacco: Never  Vaping Use   Vaping Use: Never used  Substance and Sexual Activity   Alcohol use: No   Drug use: No   Sexual activity: Not on file  Other Topics Concern   Not on file  Social History Narrative   Not on file   Social Determinants of Health   Financial Resource Strain: Not on file  Food Insecurity: Not on file  Transportation Needs: Not on file  Physical Activity: Not on file  Stress: Not on file  Social Connections: Not on file  Intimate Partner Violence: Not on file     Review of Systems: General: negative for chills, fever, night sweats or weight changes.  Cardiovascular: negative for chest pain, dyspnea on exertion, edema, orthopnea, palpitations, paroxysmal nocturnal dyspnea or shortness of breath Dermatological: negative for rash Respiratory: negative for cough or wheezing Urologic: negative for hematuria Abdominal: negative for nausea, vomiting, diarrhea, bright red blood per rectum, melena, or hematemesis Neurologic: negative for visual changes, syncope, or dizziness All other systems reviewed and are otherwise negative except as noted above.    Blood pressure (!) 170/80, pulse (!) 56, height 4' 10.8" (1.494 m), weight 124 lb 12.8 oz (56.6 kg), SpO2 96 %.  General appearance: alert and no distress Neck: no adenopathy, no JVD, supple, symmetrical, trachea midline, thyroid not enlarged, symmetric, no tenderness/mass/nodules, and bilateral carotid bruits Lungs: clear to auscultation bilaterally Heart: regular  rate and rhythm, S1, S2 normal, no murmur, click, rub or gallop Extremities: extremities normal, atraumatic, no cyanosis or edema Pulses: 2+ and symmetric Skin: Skin color, texture, turgor normal. No rashes or lesions Neurologic: Grossly normal  EKG not performed today  ASSESSMENT AND PLAN:   CAD in native artery History of CAD status post catheterization performed by Dr. Martinique 10/11/2017 revealing ostial ramus disease was unchanged compared to prior cath.  She is asymptomatic.  HLD (hyperlipidemia) History of hyperlipidemia on statin therapy with lipid profile performed 07/15/2019 revealing total cholesterol 133, LDL 64 HDL 59.  Essential hypertension History of essential hypertension a blood pressure measured today 170/80.  I did review her blood pressure log and for the most part she is under good control except for occasional blood pressure spikes.  She is on metoprolol.  Bilateral carotid artery disease ([place])  History of bilateral ICA stenosis by recent carotid Doppler performed 03/01/2021.  This will be repeated on an annual basis.  Atrial fibrillation, rapid ([place]) History of PAF maintaining sinus rhythm on Eliquis oral anticoagulation.  She is scheduled for A. fib ablation by Dr. Curt Bears in November.     Lorretta Harp MD FACP,FACC,FAHA, [facility] 03/07/2021 1:17 PM

## 2021-03-09 ENCOUNTER — Other Ambulatory Visit: Payer: Self-pay | Admitting: Family Medicine

## 2021-03-16 ENCOUNTER — Telehealth: Payer: Self-pay | Admitting: Pharmacist

## 2021-03-16 DIAGNOSIS — R198 Other specified symptoms and signs involving the digestive system and abdomen: Secondary | ICD-10-CM | POA: Diagnosis not present

## 2021-03-16 NOTE — Progress Notes (Signed)
Chronic Care Management Pharmacy Assistant   Name: Brittany Kirk  MRN: [id number] DOB: 1945-09-11  Reason for Encounter: Disease State For HTN.    Conditions to be addressed/monitored: HTN, COPD, GERD.  Recent office visits:  None since 01/25/21  Recent consult visits:  03/07/21 Cardiology Lorretta Harp, MD. For follow-up. No medication changes.  02/21/21 Cardiology Camintz, Ocie Doyne, MD. For follow-up. No medication changes.   Hospital visits:  None since 01/25/21  Medications: Outpatient Encounter Medications as of 03/16/2021  Medication Sig Note   ascorbic acid (VITAMIN C) 500 MG tablet Take 500 mg by mouth daily.    atorvastatin (LIPITOR) 40 MG tablet TAKE 1 TABLET BY MOUTH EVERYDAY AT BEDTIME    cholecalciferol (VITAMIN D3) 25 MCG (1000 UNIT) tablet Take 1,000 Units by mouth daily.    Cyanocobalamin (B-12) 2500 MCG TABS Take 2,500 mcg by mouth daily.    diazepam (VALIUM) 5 MG tablet Take 5 mg by mouth daily as needed.    diltiazem (CARDIZEM) 30 MG tablet Take 1 table as needed for palpitations. Can take 2nd table after 3 hours if persistent palpitations.    DUREZOL 0.05 % EMUL Place 1 drop into the right eye. 01/13/2021: Surgery scheduled for the end of August   ELIQUIS 5 MG TABS tablet TAKE 1 TABLET BY MOUTH TWICE A DAY    melatonin 5 MG TABS Take 5 mg by mouth at bedtime as needed (sleep). (Patient not taking: Reported on 03/07/2021)    metoprolol succinate (TOPROL-XL) 25 MG 24 hr tablet Take 12.5 mg by mouth at bedtime as needed (high blood pressure). 03/07/2021: Patient takes at 5 a.m   moxifloxacin (VIGAMOX) 0.5 % ophthalmic solution Place 1 drop into the right eye 4 (four) times daily. 01/13/2021: Surgery scheduled for the end of August   omeprazole (PRILOSEC) 20 MG capsule Take 20 mg by mouth every evening.    traZODone (DESYREL) 50 MG tablet Take 0.5-1 tablets (25-50 mg total) by mouth at bedtime as needed. for sleep (Patient not taking: Reported on 03/07/2021)     zinc gluconate 50 MG tablet Take 50 mg by mouth daily.    No facility-administered encounter medications on file as of 03/16/2021.   Reviewed chart prior to disease state call. Spoke with patient regarding BP  Recent Office Vitals: BP Readings from Last 3 Encounters:  03/07/21 (!) 142/82  02/21/21 (!) 150/78  01/13/21 (!) 131/55   Pulse Readings from Last 3 Encounters:  03/07/21 (!) 56  02/21/21 (!) 52  01/13/21 66    Wt Readings from Last 3 Encounters:  03/07/21 124 lb 12.8 oz (56.6 kg)  02/21/21 126 lb (57.2 kg)  01/13/21 123 lb (55.8 kg)     Kidney Function Lab Results  Component Value Date/Time   CREATININE 0.90 01/13/2021 02:22 AM   CREATININE 0.85 08/01/2020 01:08 AM   CREATININE 0.84 09/29/2019 12:44 PM   CREATININE 0.80 03/27/2019 12:34 PM   CREATININE 1.0 02/25/2017 02:15 PM   CREATININE 1.1 02/23/2016 02:48 PM   GFRNONAA >60 01/13/2021 02:22 AM   GFRNONAA 68 09/29/2019 12:44 PM   GFRAA >60 03/12/2020 10:36 AM   GFRAA 79 09/29/2019 12:44 PM    BMP Latest Ref Rng & Units 01/13/2021 08/01/2020 07/31/2020  Glucose 70 - 99 mg/dL 115(H) 95 90  BUN 8 - 23 mg/dL 25(H) 18 17  Creatinine 0.44 - 1.00 mg/dL 0.90 0.85 0.85  BUN/Creat Ratio 6 - 22 (calc) - - -  Sodium 135 - 145  mmol/L 137 141 142  Potassium 3.5 - 5.1 mmol/L 3.6 3.6 3.1(L)  Chloride 98 - 111 mmol/L 106 110 108  CO2 22 - 32 mmol/L 22 26 24   Calcium 8.9 - 10.3 mg/dL 8.7(L) 7.8(L) 8.2(L)    Current antihypertensive regimen:  Metoprolol Succinate 25 mg Take 0.5 tablets (12.5 mg total) by mouth daily.  (Patient stated she takes the medication occasionally)  How often are you checking your Blood Pressure? Patients husband stated infrequently  Current home BP readings: Patients husband was unable to give me any blood pressure readings.  What recent interventions/DTPs have been made by any provider to improve Blood Pressure control since last CPP Visit: None.   Any recent hospitalizations or ED visits since  last visit with CPP? Patient stated no.   What diet changes have been made to improve Blood Pressure Control?  Patients husband stated her diet has not changed.  What exercise is being done to improve your Blood Pressure Control?  Patients husband stated she has been feeling fatigue since she recently caught a cold.   Adherence Review: Is the patient currently on ACE/ARB medication? N/A  Does the patient have >5 day gap between last estimated fill dates? N/A.  Care Gaps:Patient is due for her AWV. I scheduled on 05/11/21 at 2 pm.  Star Rating Drugs:Atorvastatin 40 mg 12/15/20 90 DS.   Follow-Up:Pharmacist Review  Charlann Lange, [place] Pharmacist Assistant [phone]

## 2021-03-21 ENCOUNTER — Other Ambulatory Visit: Payer: Self-pay

## 2021-03-21 ENCOUNTER — Emergency Department (HOSPITAL_COMMUNITY): Payer: Medicare Other

## 2021-03-21 ENCOUNTER — Encounter (HOSPITAL_COMMUNITY): Payer: Self-pay | Admitting: *Deleted

## 2021-03-21 ENCOUNTER — Emergency Department (HOSPITAL_COMMUNITY)
Admission: EM | Admit: 2021-03-21 | Discharge: 2021-03-21 | Disposition: A | Discharge status: Home / Self Care | Payer: Medicare Other | Attending: Emergency Medicine | Admitting: Emergency Medicine

## 2021-03-21 DIAGNOSIS — Z91040 Latex allergy status: Secondary | ICD-10-CM | POA: Insufficient documentation

## 2021-03-21 DIAGNOSIS — N182 Chronic kidney disease, stage 2 (mild): Secondary | ICD-10-CM | POA: Diagnosis not present

## 2021-03-21 DIAGNOSIS — I2510 Atherosclerotic heart disease of native coronary artery without angina pectoris: Secondary | ICD-10-CM | POA: Diagnosis not present

## 2021-03-21 DIAGNOSIS — J449 Chronic obstructive pulmonary disease, unspecified: Secondary | ICD-10-CM | POA: Insufficient documentation

## 2021-03-21 DIAGNOSIS — Z87891 Personal history of nicotine dependence: Secondary | ICD-10-CM | POA: Insufficient documentation

## 2021-03-21 DIAGNOSIS — Z20822 Contact with and (suspected) exposure to covid-19: Secondary | ICD-10-CM | POA: Diagnosis not present

## 2021-03-21 DIAGNOSIS — I480 Paroxysmal atrial fibrillation: Secondary | ICD-10-CM | POA: Insufficient documentation

## 2021-03-21 DIAGNOSIS — I129 Hypertensive chronic kidney disease with stage 1 through stage 4 chronic kidney disease, or unspecified chronic kidney disease: Secondary | ICD-10-CM | POA: Insufficient documentation

## 2021-03-21 DIAGNOSIS — Z79899 Other long term (current) drug therapy: Secondary | ICD-10-CM | POA: Diagnosis not present

## 2021-03-21 DIAGNOSIS — Z853 Personal history of malignant neoplasm of breast: Secondary | ICD-10-CM | POA: Insufficient documentation

## 2021-03-21 DIAGNOSIS — Z85828 Personal history of other malignant neoplasm of skin: Secondary | ICD-10-CM | POA: Diagnosis not present

## 2021-03-21 DIAGNOSIS — Z955 Presence of coronary angioplasty implant and graft: Secondary | ICD-10-CM | POA: Diagnosis not present

## 2021-03-21 DIAGNOSIS — Z7901 Long term (current) use of anticoagulants: Secondary | ICD-10-CM | POA: Insufficient documentation

## 2021-03-21 DIAGNOSIS — J101 Influenza due to other identified influenza virus with other respiratory manifestations: Secondary | ICD-10-CM | POA: Insufficient documentation

## 2021-03-21 DIAGNOSIS — I4891 Unspecified atrial fibrillation: Secondary | ICD-10-CM | POA: Diagnosis not present

## 2021-03-21 DIAGNOSIS — R059 Cough, unspecified: Secondary | ICD-10-CM | POA: Diagnosis not present

## 2021-03-21 DIAGNOSIS — R509 Fever, unspecified: Secondary | ICD-10-CM | POA: Diagnosis not present

## 2021-03-21 LAB — BASIC METABOLIC PANEL
Anion gap: 10 (ref 5–15)
BUN: 15 mg/dL (ref 8–23)
CO2: 24 mmol/L (ref 22–32)
Calcium: 8.7 mg/dL — ABNORMAL LOW (ref 8.9–10.3)
Chloride: 103 mmol/L (ref 98–111)
Creatinine, Ser: 0.97 mg/dL (ref 0.44–1.00)
GFR, Estimated: >60 mL/min (ref >60)
Glucose, Bld: 103 mg/dL — ABNORMAL HIGH (ref 70–99)
Potassium: 3.6 mmol/L (ref 3.5–5.1)
Sodium: 137 mmol/L (ref 135–145)

## 2021-03-21 LAB — CBC WITH DIFFERENTIAL/PLATELET
Abs Immature Granulocytes: 0.03 10*3/uL (ref 0.00–0.07)
Basophils Absolute: 0 10*3/uL (ref 0.0–0.1)
Basophils Relative: 0 %
Eosinophils Absolute: 0 10*3/uL (ref 0.0–0.5)
Eosinophils Relative: 0 %
HCT: 43.2 % (ref 36.0–46.0)
Hemoglobin: 14.3 g/dL (ref 12.0–15.0)
Immature Granulocytes: 1 %
Lymphocytes Relative: 24 %
Lymphs Abs: 1.4 10*3/uL (ref 0.7–4.0)
MCH: 34.2 pg — ABNORMAL HIGH (ref 26.0–34.0)
MCHC: 33.1 g/dL (ref 30.0–36.0)
MCV: 103.3 fL — ABNORMAL HIGH (ref 80.0–100.0)
Monocytes Absolute: 0.4 10*3/uL (ref 0.1–1.0)
Monocytes Relative: 6 %
Neutro Abs: 4.2 10*3/uL (ref 1.7–7.7)
Neutrophils Relative %: 69 %
Platelets: 153 10*3/uL (ref 150–400)
RBC: 4.18 MIL/uL (ref 3.87–5.11)
RDW: 13.8 % (ref 11.5–15.5)
WBC: 6 10*3/uL (ref 4.0–10.5)
nRBC: 0 % (ref 0.0–0.2)

## 2021-03-21 LAB — RESP PANEL BY RT-PCR (FLU A&B, COVID) ARPGX2
Influenza A by PCR: POSITIVE — AB
Influenza B by PCR: NEGATIVE
SARS Coronavirus 2 by RT PCR: NEGATIVE

## 2021-03-21 MED ORDER — BENZONATATE 100 MG PO CAPS
200.0000 mg | ORAL_CAPSULE | Freq: Once | ORAL | Status: AC
  Administered 2021-03-21: 200 mg via ORAL
  Filled 2021-03-21: qty 2

## 2021-03-21 MED ORDER — AEROCHAMBER Z-STAT PLUS/MEDIUM MISC
1.0000 | Freq: Once | Status: AC
  Administered 2021-03-21: 1
  Filled 2021-03-21: qty 1

## 2021-03-21 MED ORDER — DILTIAZEM HCL 25 MG/5ML IV SOLN
10.0000 mg | Freq: Once | INTRAVENOUS | Status: AC
  Administered 2021-03-21: 10 mg via INTRAVENOUS
  Filled 2021-03-21: qty 5

## 2021-03-21 MED ORDER — ALBUTEROL SULFATE HFA 108 (90 BASE) MCG/ACT IN AERS
2.0000 | INHALATION_SPRAY | Freq: Once | RESPIRATORY_TRACT | Status: AC
  Administered 2021-03-21: 2 via RESPIRATORY_TRACT
  Filled 2021-03-21: qty 6.7

## 2021-03-21 MED ORDER — OSELTAMIVIR PHOSPHATE 75 MG PO CAPS: 75.0000 mg | ORAL_CAPSULE | Freq: Two times a day (BID) | ORAL | 0 refills | Status: DC

## 2021-03-21 NOTE — ED Provider Notes (Signed)
[facility] EMERGENCY DEPARTMENT Provider Note   CSN: [id number] Arrival date & time: 03/21/21  1027     History Chief Complaint  Patient presents with   Cough    Brittany Kirk is a 75 y.o. female.  Pt presents to the ED today with a cough, fever, and sore throat.  Sx have been going on for 3 days.  She was exposed to a granddaughter who was diagnosed with Flu A.  Pt has been vaccinated against Covid, but has not had any boosters.  Pt said her cough is sometimes productive.  She took some Coricidin HBP cold medicine yesterday, but it did not help much.      Past Medical History:  Diagnosis Date   Acid reflux    Allergic rhinitis    Anxiety    Asymptomatic bilateral carotid artery stenosis    40-59% (02/2018)   Atrial fibrillation ([place]) 07/2019   Breast cancer ([place]) 2015   Right Breast Cancer   CAD (coronary artery disease)    Depression    Emphysema    no longer uses inhalers   Gallstones    nausea, pain upper right abdomen   GERD (gastroesophageal reflux disease)    H/O hiatal hernia    Hiatal hernia    History of skin cancer    Hyperlipidemia    Hypertension    Multinodular goiter (nontoxic)    Scoliosis    Skin cancer    Tobacco user    Wears dentures    top    Patient Active Problem List   Diagnosis Date Noted   Chest pain 01/13/2021   Atrial fibrillation, rapid ([place]) 07/31/2020   Upper respiratory tract infection 06/09/2020   Atrial fibrillation with RVR ([place]) 07/15/2019   Multinodular goiter (nontoxic)    Asymptomatic bilateral carotid artery stenosis    History of depression 02/14/2018   History of breast cancer 02/14/2018   Accelerated hypertension 02/14/2018   Unstable angina ([place]) 10/11/2017   Caloric malnutrition ([place]) 08/16/2017   Weight loss, unintentional 03/12/2017   GERD (gastroesophageal reflux disease) 08/10/2016   Cough 08/10/2016   Acute bronchitis 07/12/2016   Bilateral carotid artery disease ([place]) 02/14/2016   Chest pain  with moderate risk for cardiac etiology 01/13/2016   Osteoporosis 02/22/2015   Stage 2 chronic kidney disease due to benign hypertension 08/16/2014   Malignant neoplasm of upper-outer quadrant of right breast in female, estrogen receptor positive ([place]) 03/22/2014   Dizziness and giddiness 11/27/2012   Essential tremor 11/27/2012   Numbness 11/27/2012   TOBACCO USER 10/04/2009   CLOSED FRACTURE OF ONE RIB 07/12/2009   HLD (hyperlipidemia) 09/28/2008   CAD in native artery 09/28/2008   Rhinitis 09/09/2008   COPD (chronic obstructive pulmonary disease) ([place]) 09/07/2008   Essential hypertension 09/06/2008   SKIN CANCER, HX OF 09/06/2008    Past Surgical History:  Procedure Laterality Date   BREAST LUMPECTOMY Right 2015   CHOLECYSTECTOMY  07/08/2012   Procedure: LAPAROSCOPIC CHOLECYSTECTOMY WITH INTRAOPERATIVE CHOLANGIOGRAM;  Surgeon: Gayland Curry, MD,FACS;  Location: [facility];  Service: General;  Laterality: N/A;  Laparoscopic Cholecystectomy with Intraoperative Cholangiogram   COLONOSCOPY     ESOPHAGOSCOPY N/A 05/29/2017   Procedure: ESOPHAGOSCOPY;  Surgeon: Clarene Essex, MD;  Location: [facility];  Service: Endoscopy;  Laterality: N/A;  botox injection   HAND SURGERY Left    wrist   LEFT HEART CATH AND CORONARY ANGIOGRAPHY N/A 10/11/2017   Procedure: LEFT HEART CATH AND CORONARY ANGIOGRAPHY;  Surgeon: Martinique, Peter M,  MD;  Location: [facility];  Service: Cardiovascular;  Laterality: N/A;   TONSILECTOMY, ADENOIDECTOMY, BILATERAL MYRINGOTOMY AND TUBES     TUBAL LIGATION       OB History   No obstetric history on file.     Family History  Problem Relation Age of Onset   Arthritis Mother    Pancreatic cancer Mother    Heart disease Other        5 brothers and 2 sister   Diverticulitis Sister    Heart disease Sister    Aplastic anemia Other     Social History   Tobacco Use   Smoking status: Former    Packs/day: 0.50    Years: 46.00    Pack years: 23.00    Types:  Cigarettes    Quit date: 02/13/2011    Years since quitting: 10.1   Smokeless tobacco: Never  Vaping Use   Vaping Use: Never used  Substance Use Topics   Alcohol use: No   Drug use: No    Home Medications Prior to Admission medications   Medication Sig Start Date End Date Taking? Authorizing Provider  oseltamivir (TAMIFLU) 75 MG capsule Take 1 capsule (75 mg total) by mouth every 12 (twelve) hours. 03/21/21  Yes Isla Pence, MD  ascorbic acid (VITAMIN C) 500 MG tablet Take 500 mg by mouth daily.    [provider]  atorvastatin (LIPITOR) 40 MG tablet TAKE 1 TABLET BY MOUTH EVERYDAY AT BEDTIME 03/14/21   Susy Frizzle, MD  cholecalciferol (VITAMIN D3) 25 MCG (1000 UNIT) tablet Take 1,000 Units by mouth daily.    [provider]  Cyanocobalamin (B-12) 2500 MCG TABS Take 2,500 mcg by mouth daily.    [provider]  diazepam (VALIUM) 5 MG tablet Take 5 mg by mouth daily as needed. 12/13/20   [provider]  diltiazem (CARDIZEM) 30 MG tablet Take 1 table as needed for palpitations. Can take 2nd table after 3 hours if persistent palpitations. 01/13/21   Bhagat, Bhavinkumar, PA  DUREZOL 0.05 % EMUL Place 1 drop into the right eye. 01/10/21   [provider]  ELIQUIS 5 MG TABS tablet TAKE 1 TABLET BY MOUTH TWICE A DAY 10/10/20   Lorretta Harp, MD  melatonin 5 MG TABS Take 5 mg by mouth at bedtime as needed (sleep). Patient not taking: Reported on 03/07/2021    [provider]  metoprolol succinate (TOPROL-XL) 25 MG 24 hr tablet Take 12.5 mg by mouth at bedtime as needed (high blood pressure). 12/20/20   [provider]  moxifloxacin (VIGAMOX) 0.5 % ophthalmic solution Place 1 drop into the right eye 4 (four) times daily. 01/07/21   [provider]  omeprazole (PRILOSEC) 20 MG capsule Take 20 mg by mouth every evening. 07/06/19   [provider]  traZODone (DESYREL) 50 MG tablet Take 0.5-1 tablets (25-50 mg total) by  mouth at bedtime as needed. for sleep Patient not taking: No sig reported 02/15/21   Susy Frizzle, MD  zinc gluconate 50 MG tablet Take 50 mg by mouth daily.    [provider]    Allergies    Penicillins, Cefaclor, Latex, Metronidazole, Naproxen, Nsaids, Septra [bactrim], Sulfonamide derivatives, and Tape  Review of Systems   Review of Systems  Respiratory:  Positive for cough, shortness of breath and wheezing.   All other systems reviewed and are negative.  Physical Exam Updated Vital Signs BP (!) 137/59   Pulse 85  Temp 98.6 F (37 C)   Resp (!) 26   SpO2 91%   Physical Exam Vitals and nursing note reviewed.  Constitutional:      Appearance: Normal appearance.  HENT:     Head: Normocephalic and atraumatic.     Right Ear: External ear normal.     Left Ear: External ear normal.     Nose: Rhinorrhea present.     Mouth/Throat:     Mouth: Mucous membranes are moist.     Pharynx: Oropharynx is clear.  Eyes:     Extraocular Movements: Extraocular movements intact.     Conjunctiva/sclera: Conjunctivae normal.     Pupils: Pupils are equal, round, and reactive to light.  Cardiovascular:     Rate and Rhythm: Normal rate and regular rhythm.     Pulses: Normal pulses.     Heart sounds: Normal heart sounds.  Pulmonary:     Effort: Pulmonary effort is normal.     Breath sounds: Normal breath sounds.  Abdominal:     General: Abdomen is flat. Bowel sounds are normal.     Palpations: Abdomen is soft.  Musculoskeletal:        General: Normal range of motion.     Cervical back: Normal range of motion and neck supple.  Skin:    General: Skin is warm.     Capillary Refill: Capillary refill takes less than 2 seconds.  Neurological:     General: No focal deficit present.     Mental Status: She is alert and oriented to person, place, and time.  Psychiatric:        Mood and Affect: Mood normal.        Behavior: Behavior normal.    ED Results / Procedures /  Treatments   Labs (all labs ordered are listed, but only abnormal results are displayed) Labs Reviewed  RESP PANEL BY RT-PCR (FLU A&B, COVID) ARPGX2 - Abnormal; Notable for the following components:      Result Value   Influenza A by PCR POSITIVE (*)    All other components within normal limits  BASIC METABOLIC PANEL - Abnormal; Notable for the following components:   Glucose, Bld 103 (*)    Calcium 8.7 (*)    All other components within normal limits  CBC WITH DIFFERENTIAL/PLATELET - Abnormal; Notable for the following components:   MCV 103.3 (*)    MCH 34.2 (*)    All other components within normal limits    EKG EKG Interpretation  Date/Time:  Tuesday March 21 2021 14:12:29 EDT Ventricular Rate:  119 PR Interval:    QRS Duration: 100 QT Interval:  330 QTC Calculation: 465 R Axis:   48 Text Interpretation: Atrial fibrillation Repol abnrm, severe global ischemia (LM/MVD) Since last tracing rate faster and in afib Confirmed by Isla Pence [phone]) on 03/21/2021 2:18:24 PM  Radiology DG Chest Portable 1 View  Result Date: 03/21/2021 CLINICAL DATA:  Cough and fever for 3 days. EXAM: PORTABLE CHEST 1 VIEW COMPARISON:  01/13/2021 FINDINGS: The cardiac silhouette, mediastinal and hilar contours are within normal limits and stable. The lungs are clear of an acute process. No pleural effusions or pulmonary lesions. The bony thorax is intact. IMPRESSION: No acute cardiopulmonary findings. Electronically Signed   By: Marijo Sanes M.D.   On: 03/21/2021 12:58    Procedures Procedures   Medications Ordered in ED Medications  albuterol (VENTOLIN HFA) 108 (90 Base) MCG/ACT inhaler 2 puff (2 puffs Inhalation Given 03/21/21 1313)  aerochamber Z-Stat  Plus/medium 1 each (1 each Other Given 03/21/21 1427)  benzonatate (TESSALON) capsule 200 mg (200 mg Oral Given 03/21/21 1259)  diltiazem (CARDIZEM) injection 10 mg (10 mg Intravenous Given 03/21/21 1425)    ED Course  I have reviewed  the triage vital signs and the nursing notes.  Pertinent labs & imaging results that were available during my care of the patient were reviewed by me and considered in my medical decision making (see chart for details).    MDM Rules/Calculators/A&P                           Pt's flu is +.  Covid negative.  Pt was given albuterol which caused her HR to jump up.  She has a hx of afib and is on Eliquis.  She did take her metoprolol today.  She is given a dose of cardizem and HR down to the 80s.  She was able to ambulate without HR going back up.  She is scheduled for an ablation in November.  Pt is stable for d/c and knows to return if worse.  F/u with pcp.  CHA2DS2/VAS Stroke Risk Points  Current as of 7 minutes ago     5 >= 2 Points: High Risk  1 - 1.99 Points: Medium Risk  0 Points: Low Risk    Last Change: N/A      Details    This score determines the patient's risk of having a stroke if the  patient has atrial fibrillation.       Points Metrics  0 Has Congestive Heart Failure:  No    Current as of 7 minutes ago  1 Has Vascular Disease:  Yes    Current as of 7 minutes ago  1 Has Hypertension:  Yes    Current as of 7 minutes ago  2 Age:  74    Current as of 7 minutes ago  0 Has Diabetes:  No    Current as of 7 minutes ago  0 Had Stroke:  No  Had TIA:  No  Had Thromboembolism:  No    Current as of 7 minutes ago  1 Female:  Yes    Current as of 7 minutes ago     Brittany Kirk was evaluated in Emergency Department on 03/21/2021 for the symptoms described in the history of present illness. She was evaluated in the context of the global COVID-19 pandemic, which necessitated consideration that the patient might be at risk for infection with the SARS-CoV-2 virus that causes COVID-19. Institutional protocols and algorithms that pertain to the evaluation of patients at risk for COVID-19 are in a state of rapid change based on information released by regulatory bodies including the  CDC and federal and state organizations. These policies and algorithms were followed during the patient's care in the ED.       Final Clinical Impression(s) / ED Diagnoses Final diagnoses:  Influenza A  Paroxysmal atrial fibrillation ([place])    Rx / DC Orders ED Discharge Orders          Ordered    oseltamivir (TAMIFLU) 75 MG capsule  Every 12 hours        03/21/21 1518             Isla Pence, MD 03/21/21 1521

## 2021-03-21 NOTE — ED Triage Notes (Signed)
Cough, fever sore throat for 3 days

## 2021-03-31 ENCOUNTER — Other Ambulatory Visit: Payer: Medicare Other | Admitting: *Deleted

## 2021-03-31 ENCOUNTER — Other Ambulatory Visit: Payer: Self-pay

## 2021-03-31 DIAGNOSIS — Z01812 Encounter for preprocedural laboratory examination: Secondary | ICD-10-CM

## 2021-03-31 DIAGNOSIS — I480 Paroxysmal atrial fibrillation: Secondary | ICD-10-CM

## 2021-03-31 LAB — CBC
Hematocrit: 35.7 % (ref 34.0–46.6)
Hemoglobin: 11.8 g/dL (ref 11.1–15.9)
MCH: 32.3 pg (ref 26.6–33.0)
MCHC: 33.1 g/dL (ref 31.5–35.7)
MCV: 98 fL — ABNORMAL HIGH (ref 79–97)
Platelets: 332 10*3/uL (ref 150–450)
RBC: 3.65 x10E6/uL — ABNORMAL LOW (ref 3.77–5.28)
RDW: 12.9 % (ref 11.7–15.4)
WBC: 6.1 10*3/uL (ref 3.4–10.8)

## 2021-03-31 LAB — BASIC METABOLIC PANEL
BUN/Creatinine Ratio: 19 (ref 12–28)
BUN: 20 mg/dL (ref 8–27)
CO2: 24 mmol/L (ref 20–29)
Calcium: 8.6 mg/dL — ABNORMAL LOW (ref 8.7–10.3)
Chloride: 103 mmol/L (ref 96–106)
Creatinine, Ser: 1.03 mg/dL — ABNORMAL HIGH (ref 0.57–1.00)
Glucose: 103 mg/dL — ABNORMAL HIGH (ref 70–99)
Potassium: 4.4 mmol/L (ref 3.5–5.2)
Sodium: 140 mmol/L (ref 134–144)
eGFR: 56 mL/min/{1.73_m2} — ABNORMAL LOW (ref >59)

## 2021-04-03 ENCOUNTER — Ambulatory Visit
Admission: RE | Admit: 2021-04-03 | Discharge: 2021-04-03 | Disposition: A | Discharge status: Home / Self Care | Payer: Medicare Other | Source: Ambulatory Visit | Attending: Family Medicine | Admitting: Family Medicine

## 2021-04-03 ENCOUNTER — Other Ambulatory Visit: Payer: Self-pay

## 2021-04-03 ENCOUNTER — Ambulatory Visit (INDEPENDENT_AMBULATORY_CARE_PROVIDER_SITE_OTHER): Payer: Medicare Other | Admitting: Family Medicine

## 2021-04-03 VITALS — BP 128/66 | HR 68 | Temp 97.9°F | Resp 14 | Ht <= 58 in | Wt 124.0 lb

## 2021-04-03 DIAGNOSIS — M8000XS Age-related osteoporosis with current pathological fracture, unspecified site, sequela: Secondary | ICD-10-CM | POA: Diagnosis not present

## 2021-04-03 DIAGNOSIS — M545 Low back pain, unspecified: Secondary | ICD-10-CM | POA: Diagnosis not present

## 2021-04-03 DIAGNOSIS — H60313 Diffuse otitis externa, bilateral: Secondary | ICD-10-CM

## 2021-04-03 DIAGNOSIS — S39012A Strain of muscle, fascia and tendon of lower back, initial encounter: Secondary | ICD-10-CM

## 2021-04-03 DIAGNOSIS — I6523 Occlusion and stenosis of bilateral carotid arteries: Secondary | ICD-10-CM

## 2021-04-03 MED ORDER — NEOMYCIN-POLYMYXIN-HC 3.5-10000-1 OT SOLN: 3.0000 [drp] | Freq: Four times a day (QID) | OTIC | 0 refills | Status: DC

## 2021-04-03 MED ORDER — TRAMADOL HCL 50 MG PO TABS
50.0000 mg | ORAL_TABLET | Freq: Four times a day (QID) | ORAL | 0 refills | Status: AC | PRN
Start: 2021-04-03 — End: 2021-05-03

## 2021-04-03 MED ORDER — ALENDRONATE SODIUM 70 MG PO TABS: 70.0000 mg | ORAL_TABLET | ORAL | 11 refills | Status: DC

## 2021-04-03 NOTE — Progress Notes (Signed)
Subjective:    Patient ID: Brittany Kirk, female    DOB: September 21, 1945, 75 y.o.   MRN: [id number]  Patient last had an MRI of her lower back in 2019.  The results are included below for my reference: T11-12: Minimal disc bulging without stenosis.   T12-L1: Minimal disc bulging and slight facet hypertrophy without stenosis.   L1-2: Slight facet hypertrophy without disc herniation or stenosis.   L2-3: Minimal disc bulging and slight facet hypertrophy without stenosis.   L3-4: Minimal disc bulging and slight facet hypertrophy without disc herniation or stenosis.   L4-5: Minimal disc bulging and asymmetric mild left facet hypertrophy without stenosis.   L5-S1: Mild disc bulging and mild facet hypertrophy without stenosis.  She also had an x-ray of her lumbar spine in December 2021 that revealed 2 compression fractures at L2 and L5.  Patient had a bone density test in 2016 that revealed osteoporosis in the spine with a T score below -3.   Patient developed the flu approximately 3 weeks ago.  She tested positive at the hospital.  She was coughing severely.  Ever since that time she has had pain in her lower back roughly around the level of L4 and L5.  She is tender to palpation along the spinous processes of both of these vertebrae.  She is also tender to palpation in her posterior left hip.  There is a palpable muscle spasm in that area that is tender to the touch and reproduces her pain.  She denies any chest pain shortness of breath or dyspnea on exertion.  She also complains of chronic pain in her left groin.  She reports it is a burning pain.  There is no dysuria or hematuria or vaginal bleeding.  She denies any melena or hematochezia or fevers or chills.  This pain has been present off and on now for more than a year.  Of note, she had an MRI in January of her left hip.  This showed nondisplaced fractures of the left pelvic pubic root and anterior acetabulum as well as the left pubic bone  and inferior ramus.  She also had nondisplaced intertrochanteric fractures of the right femur, bilateral sacral alla fractures.  Of note, she had a mildly enlarged external iliac chain lymph node that was 1 cm in the short axis similar to her previous CT from August 21.  She also complains of right ear drainage and pain as well as decreased hearing in the left ear.  On physical exam, there is copious purulent material and exudate all throughout the left auditory canal completely obscuring the left tympanic membrane.  The right auditory canal is swollen partially obscuring the tympanic membrane.  There is also some yellow exudate in the floor of the right auditory canal Past Medical History:  Diagnosis Date  . Acid reflux   . Allergic rhinitis   . Anxiety   . Asymptomatic bilateral carotid artery stenosis    40-59% (02/2018)  . Atrial fibrillation ([place]) 07/2019  . Breast cancer ([place]) 2015   Right Breast Cancer  . CAD (coronary artery disease)   . Depression   . Emphysema    no longer uses inhalers  . Gallstones    nausea, pain upper right abdomen  . GERD (gastroesophageal reflux disease)   . H/O hiatal hernia   . Hiatal hernia   . History of skin cancer   . Hyperlipidemia   . Hypertension   . Multinodular goiter (nontoxic)   . Scoliosis   .  Skin cancer   . Tobacco user   . Wears dentures    top   Past Surgical History:  Procedure Laterality Date  . BREAST LUMPECTOMY Right 2015  . CHOLECYSTECTOMY  07/08/2012   Procedure: LAPAROSCOPIC CHOLECYSTECTOMY WITH INTRAOPERATIVE CHOLANGIOGRAM;  Surgeon: Gayland Curry, MD,FACS;  Location: [facility];  Service: General;  Laterality: N/A;  Laparoscopic Cholecystectomy with Intraoperative Cholangiogram  . COLONOSCOPY    . ESOPHAGOSCOPY N/A 05/29/2017   Procedure: ESOPHAGOSCOPY;  Surgeon: Clarene Essex, MD;  Location: [facility];  Service: Endoscopy;  Laterality: N/A;  botox injection  . HAND SURGERY Left    wrist  . LEFT HEART CATH AND CORONARY  ANGIOGRAPHY N/A 10/11/2017   Procedure: LEFT HEART CATH AND CORONARY ANGIOGRAPHY;  Surgeon: Martinique, Peter M, MD;  Location: [facility];  Service: Cardiovascular;  Laterality: N/A;  . TONSILECTOMY, ADENOIDECTOMY, BILATERAL MYRINGOTOMY AND TUBES    . TUBAL LIGATION     Current Outpatient Medications on File Prior to Visit  Medication Sig Dispense Refill  . ascorbic acid (VITAMIN C) 500 MG tablet Take 500 mg by mouth daily.    Marland Kitchen atorvastatin (LIPITOR) 40 MG tablet TAKE 1 TABLET BY MOUTH EVERYDAY AT BEDTIME 90 tablet 2  . cholecalciferol (VITAMIN D3) 25 MCG (1000 UNIT) tablet Take 1,000 Units by mouth daily.    . Cyanocobalamin (B-12) 2500 MCG TABS Take 2,500 mcg by mouth daily.    . diazepam (VALIUM) 5 MG tablet Take 5 mg by mouth daily as needed.    . diltiazem (CARDIZEM) 30 MG tablet Take 1 table as needed for palpitations. Can take 2nd table after 3 hours if persistent palpitations. 30 tablet 1  . DUREZOL 0.05 % EMUL Place 1 drop into the right eye.    Marland Kitchen ELIQUIS 5 MG TABS tablet TAKE 1 TABLET BY MOUTH TWICE A DAY 180 tablet 1  . melatonin 5 MG TABS Take 5 mg by mouth at bedtime as needed (sleep). (Patient not taking: Reported on 03/07/2021)    . metoprolol succinate (TOPROL-XL) 25 MG 24 hr tablet Take 12.5 mg by mouth at bedtime as needed (high blood pressure).    . moxifloxacin (VIGAMOX) 0.5 % ophthalmic solution Place 1 drop into the right eye 4 (four) times daily.    Marland Kitchen omeprazole (PRILOSEC) 20 MG capsule Take 20 mg by mouth every evening.    Marland Kitchen oseltamivir (TAMIFLU) 75 MG capsule Take 1 capsule (75 mg total) by mouth every 12 (twelve) hours. 10 capsule 0  . traZODone (DESYREL) 50 MG tablet Take 0.5-1 tablets (25-50 mg total) by mouth at bedtime as needed. for sleep (Patient not taking: No sig reported) 90 tablet 3  . zinc gluconate 50 MG tablet Take 50 mg by mouth daily.     No current facility-administered medications on file prior to visit.   Allergies  Allergen Reactions  .  Penicillins Anaphylaxis, Itching, Swelling and Rash    Has patient had a PCN reaction causing immediate rash, facial/tongue/throat swelling, SOB or lightheadedness with hypotension: Yes Has patient had a PCN reaction causing severe rash involving mucus membranes or skin necrosis: No Has patient had a PCN reaction that required hospitalization: Yes Has patient had a PCN reaction occurring within the last 10 years: No If all of the above answers are "NO", then may proceed with Cephalosporin use.   . Cefaclor Itching, Swelling and Rash  . Latex Itching and Rash  . Metronidazole Itching, Swelling and Rash  . Naproxen Itching, Swelling and Rash  .  Nsaids Itching, Swelling and Rash    Ibuprofen IS tolerated  . Septra [Bactrim] Itching, Swelling and Rash  . Sulfonamide Derivatives Itching, Swelling and Rash  . Tape Itching and Rash    ONLY USE PAPER TAPE   Social History   Socioeconomic History  . Marital status: Married    Spouse name: Not on file  . Number of children: 1  . Years of education: Not on file  . Highest education level: Not on file  Occupational History  . Occupation: retired Clinical cytogeneticist: RETIRED  Tobacco Use  . Smoking status: Former    Packs/day: 0.50    Years: 46.00    Pack years: 23.00    Types: Cigarettes    Quit date: 02/13/2011    Years since quitting: 10.1  . Smokeless tobacco: Never  Vaping Use  . Vaping Use: Never used  Substance and Sexual Activity  . Alcohol use: No  . Drug use: No  . Sexual activity: Not on file  Other Topics Concern  . Not on file  Social History Narrative  . Not on file   Social Determinants of Health   Financial Resource Strain: Not on file  Food Insecurity: Not on file  Transportation Needs: Not on file  Physical Activity: Not on file  Stress: Not on file  Social Connections: Not on file  Intimate Partner Violence: Not on file     Review of Systems  All other systems reviewed and are  negative.     Objective:   Physical Exam Vitals reviewed.  Constitutional:      General: She is not in acute distress.    Appearance: She is well-developed. She is not diaphoretic.  HENT:     Head: Normocephalic and atraumatic.     Right Ear: External ear normal. Drainage present.     Left Ear: External ear normal. Decreased hearing noted. Drainage and swelling present.     Mouth/Throat:     Pharynx: No oropharyngeal exudate.  Eyes:     Conjunctiva/sclera: Conjunctivae normal.     Pupils: Pupils are equal, round, and reactive to light.  Neck:     Thyroid: No thyromegaly.     Vascular: No JVD.  Cardiovascular:     Rate and Rhythm: Normal rate and regular rhythm.     Heart sounds: Normal heart sounds. No murmur heard.   No friction rub. No gallop.  Pulmonary:     Effort: Pulmonary effort is normal. No respiratory distress.     Breath sounds: Normal breath sounds. No stridor. No wheezing or rales.  Abdominal:     General: Bowel sounds are normal. There is no distension.     Palpations: Abdomen is soft.     Tenderness: There is no abdominal tenderness. There is no guarding.  Musculoskeletal:     Cervical back: Normal range of motion.     Lumbar back: Spasms, tenderness and bony tenderness present. Decreased range of motion. Negative right straight leg raise test and negative left straight leg raise test.       Back:     Right hip: No tenderness or bony tenderness. Normal range of motion. Normal strength.     Left hip: Tenderness and bony tenderness present. Normal range of motion. Normal strength.       Legs:  Lymphadenopathy:     Cervical: No cervical adenopathy.  Neurological:     Mental Status: She is alert and oriented to person, place, and time.  Cranial Nerves: No cranial nerve deficit.     Sensory: No sensory deficit.     Motor: Tremor present. No atrophy, abnormal muscle tone or seizure activity.     Deep Tendon Reflexes: Reflexes are normal and symmetric.           Assessment & Plan:  Strain of lumbar region, initial encounter - Plan: DG Lumbar Spine Complete  Acute diffuse otitis externa of both ears  Age-related osteoporosis with current pathological fracture, sequela Given her history of osteoporosis and vertebral fractures and hip fractures, I have recommended again Fosamax 70 mg weekly.  Patient concedes and will take Fosamax for prevention.  Also given her history of osteoporosis and the new onset pain in her lower back I will repeat an x-ray of the lumbar spine to see if the patient has suffered another vertebral fracture.  Based on her exam she at least has muscle spasm in the left paraspinal muscle.  I will treat her with tramadol 50 mg every 6 hours as needed for pain and await the results of the x-ray.  She also has diffuse otitis externa of both ears and I will treat this with Cortisporin HC otic 3 drops 4 times a day in each ear and reassess in 1 week to ensure resolution.  I believe the pain in her left groin is most likely due to the multiple fractures and scar tissue that she suffered from her fall 1 year ago.  However I would recommend repeating a CT scan to reassess the lymph node.  Await the results of the x-rays to determine if additional imaging is required.  If the x-rays are normal, I will proceed with a CT scan of the pelvis to reassess the lymph nodes

## 2021-04-06 ENCOUNTER — Telehealth (HOSPITAL_COMMUNITY): Payer: Self-pay | Admitting: *Deleted

## 2021-04-06 NOTE — Telephone Encounter (Signed)
Patient returning call regarding upcoming cardiac imaging study; pt verbalizes understanding of appt date/time, parking situation and where to check in, pre-test NPO status and medications ordered, and verified current allergies; name and call back number provided for further questions should they arise  Rooney Swails RN Navigator Cardiac Imaging [facility] [phone] office [phone] cell   

## 2021-04-06 NOTE — Telephone Encounter (Signed)
Attempted to call patient regarding upcoming cardiac CT appointment. °Left message on voicemail with name and callback number ° °Kenlynn Houde RN Navigator Cardiac Imaging ° [facility] °[phone] Office °[phone] Cell ° °

## 2021-04-07 ENCOUNTER — Other Ambulatory Visit: Payer: Self-pay

## 2021-04-07 ENCOUNTER — Ambulatory Visit (HOSPITAL_COMMUNITY)
Admission: RE | Admit: 2021-04-07 | Discharge: 2021-04-07 | Disposition: A | Discharge status: Home / Self Care | Payer: Medicare Other | Source: Ambulatory Visit | Attending: Cardiology | Admitting: Cardiology

## 2021-04-07 DIAGNOSIS — I480 Paroxysmal atrial fibrillation: Secondary | ICD-10-CM | POA: Insufficient documentation

## 2021-04-07 MED ORDER — IOHEXOL 350 MG/ML SOLN
100.0000 mL | Freq: Once | INTRAVENOUS | Status: AC | PRN
  Administered 2021-04-07: 100 mL via INTRAVENOUS

## 2021-04-07 NOTE — Progress Notes (Signed)
Vitals stable. Pt states that she is feeling better at this time and that she is ready to be discharged. Pt states that after the CT scan her head was hurting but that it is no longer hurting. Assisted pt with dressing and discharged to waiting area via wheelchair where her husband is able to wheel her to the main entrance.

## 2021-04-07 NOTE — Progress Notes (Signed)
Pt returned from ct with tech after scan via wheelchair stating that she does not feel very well and needs to use the restroom. Pt assisted to the restroom with one person assist without difficulty and instructed to pull alert when finished.

## 2021-04-08 ENCOUNTER — Other Ambulatory Visit: Payer: Self-pay | Admitting: Cardiovascular Disease

## 2021-04-10 NOTE — Telephone Encounter (Signed)
Prescription refill request for Eliquis received. Indication: afib Last office visit: berry 03/07/21 Scr:1.03 03/31/21 Age: 57f Weight:56.2kg

## 2021-04-11 ENCOUNTER — Ambulatory Visit (INDEPENDENT_AMBULATORY_CARE_PROVIDER_SITE_OTHER): Payer: Medicare Other | Admitting: Family Medicine

## 2021-04-11 ENCOUNTER — Other Ambulatory Visit: Payer: Self-pay

## 2021-04-11 DIAGNOSIS — H60313 Diffuse otitis externa, bilateral: Secondary | ICD-10-CM

## 2021-04-11 DIAGNOSIS — R300 Dysuria: Secondary | ICD-10-CM | POA: Diagnosis not present

## 2021-04-11 DIAGNOSIS — R3129 Other microscopic hematuria: Secondary | ICD-10-CM

## 2021-04-11 DIAGNOSIS — I6523 Occlusion and stenosis of bilateral carotid arteries: Secondary | ICD-10-CM

## 2021-04-11 LAB — URINALYSIS, ROUTINE W REFLEX MICROSCOPIC
Bacteria, UA: NONE SEEN /HPF
Bilirubin Urine: NEGATIVE
Glucose, UA: NEGATIVE
Hyaline Cast: NONE SEEN /LPF
Ketones, ur: NEGATIVE
Leukocytes,Ua: NEGATIVE
Nitrite: NEGATIVE
Protein, ur: NEGATIVE
Specific Gravity, Urine: 1.01 (ref 1.001–1.035)
WBC, UA: NONE SEEN /HPF (ref 0–5)
pH: 6 (ref 5.0–8.0)

## 2021-04-11 LAB — MICROSCOPIC MESSAGE

## 2021-04-11 MED ORDER — LEVOFLOXACIN 500 MG PO TABS: 500.0000 mg | ORAL_TABLET | Freq: Every day | ORAL | 0 refills | Status: AC

## 2021-04-11 NOTE — Progress Notes (Signed)
Subjective:    Patient ID: Brittany Kirk, female    DOB: 10-07-1945, 75 y.o.   MRN: [id number]  Patient last had an MRI of her lower back in 2019.  The results are included below for my reference: T11-12: Minimal disc bulging without stenosis.   T12-L1: Minimal disc bulging and slight facet hypertrophy without stenosis.   L1-2: Slight facet hypertrophy without disc herniation or stenosis.   L2-3: Minimal disc bulging and slight facet hypertrophy without stenosis.   L3-4: Minimal disc bulging and slight facet hypertrophy without disc herniation or stenosis.   L4-5: Minimal disc bulging and asymmetric mild left facet hypertrophy without stenosis.   L5-S1: Mild disc bulging and mild facet hypertrophy without stenosis.  She also had an x-ray of her lumbar spine in December 2021 that revealed 2 compression fractures at L2 and L5.  Patient had a bone density test in 2016 that revealed osteoporosis in the spine with a T score below -3.   Patient developed the flu approximately 3 weeks ago.  She tested positive at the hospital.  She was coughing severely.  Ever since that time she has had pain in her lower back roughly around the level of L4 and L5.  She is tender to palpation along the spinous processes of both of these vertebrae.  She is also tender to palpation in her posterior left hip.  There is a palpable muscle spasm in that area that is tender to the touch and reproduces her pain.  She denies any chest pain shortness of breath or dyspnea on exertion.  She also complains of chronic pain in her left groin.  She reports it is a burning pain.  There is no dysuria or hematuria or vaginal bleeding.  She denies any melena or hematochezia or fevers or chills.  This pain has been present off and on now for more than a year.  Of note, she had an MRI in January of her left hip.  This showed nondisplaced fractures of the left pelvic pubic root and anterior acetabulum as well as the left pubic bone  and inferior ramus.  She also had nondisplaced intertrochanteric fractures of the right femur, bilateral sacral alla fractures.  Of note, she had a mildly enlarged external iliac chain lymph node that was 1 cm in the short axis similar to her previous CT from August 21.  She also complains of right ear drainage and pain as well as decreased hearing in the left ear.  On physical exam, there is copious purulent material and exudate all throughout the left auditory canal completely obscuring the left tympanic membrane.  The right auditory canal is swollen partially obscuring the tympanic membrane.  There is also some yellow exudate in the floor of the right auditory canal.  At that time, my plan was: Given her history of osteoporosis and vertebral fractures and hip fractures, I have recommended again Fosamax 70 mg weekly.  Patient concedes and will take Fosamax for prevention.  Also given her history of osteoporosis and the new onset pain in her lower back I will repeat an x-ray of the lumbar spine to see if the patient has suffered another vertebral fracture.  Based on her exam she at least has muscle spasm in the left paraspinal muscle.  I will treat her with tramadol 50 mg every 6 hours as needed for pain and await the results of the x-ray.  She also has diffuse otitis externa of both ears and I will treat  this with Cortisporin HC otic 3 drops 4 times a day in each ear and reassess in 1 week to ensure resolution.  I believe the pain in her left groin is most likely due to the multiple fractures and scar tissue that she suffered from her fall 1 year ago.  However I would recommend repeating a CT scan to reassess the lymph node.  Await the results of the x-rays to determine if additional imaging is required.  If the x-rays are normal, I will proceed with a CT scan of the pelvis to reassess the lymph nodes  04/11/21 Examination of the auditory canal reveals copious purulent material all throughout the left  auditory canal and also in the right auditory canal though not as impressive as the left.  The patient continues to have decreased hearing in the left ear although she denies any otalgia or fevers or chills or pain.  She also reports some mild dysuria at night as well as spasms at the urethra whenever she voids at night.  This is been going on since I last saw her.  Urinalysis today shows +1 blood but negative nitrates and negative leukocyte esterase. Past Medical History:  Diagnosis Date   Acid reflux    Allergic rhinitis    Anxiety    Asymptomatic bilateral carotid artery stenosis    40-59% (02/2018)   Atrial fibrillation ([place]) 07/2019   Breast cancer ([place]) 2015   Right Breast Cancer   CAD (coronary artery disease)    Depression    Emphysema    no longer uses inhalers   Gallstones    nausea, pain upper right abdomen   GERD (gastroesophageal reflux disease)    H/O hiatal hernia    Hiatal hernia    History of skin cancer    Hyperlipidemia    Hypertension    Multinodular goiter (nontoxic)    Scoliosis    Skin cancer    Tobacco user    Wears dentures    top   Past Surgical History:  Procedure Laterality Date   BREAST LUMPECTOMY Right 2015   CHOLECYSTECTOMY  07/08/2012   Procedure: LAPAROSCOPIC CHOLECYSTECTOMY WITH INTRAOPERATIVE CHOLANGIOGRAM;  Surgeon: Gayland Curry, MD,FACS;  Location: [facility];  Service: General;  Laterality: N/A;  Laparoscopic Cholecystectomy with Intraoperative Cholangiogram   COLONOSCOPY     ESOPHAGOSCOPY N/A 05/29/2017   Procedure: ESOPHAGOSCOPY;  Surgeon: Clarene Essex, MD;  Location: [facility];  Service: Endoscopy;  Laterality: N/A;  botox injection   HAND SURGERY Left    wrist   LEFT HEART CATH AND CORONARY ANGIOGRAPHY N/A 10/11/2017   Procedure: LEFT HEART CATH AND CORONARY ANGIOGRAPHY;  Surgeon: Martinique, Peter M, MD;  Location: [facility];  Service: Cardiovascular;  Laterality: N/A;   TONSILECTOMY, ADENOIDECTOMY, BILATERAL MYRINGOTOMY AND TUBES      TUBAL LIGATION     Current Outpatient Medications on File Prior to Visit  Medication Sig Dispense Refill   alendronate (FOSAMAX) 70 MG tablet Take 1 tablet (70 mg total) by mouth every 7 (seven) days. Take with a full glass of water on an empty stomach. (Patient taking differently: Take 70 mg by mouth every Sunday. Take with a full glass of water on an empty stomach.) 4 tablet 11   ascorbic acid (VITAMIN C) 500 MG tablet Take 500 mg by mouth daily.     atorvastatin (LIPITOR) 40 MG tablet TAKE 1 TABLET BY MOUTH EVERYDAY AT BEDTIME 90 tablet 2   Calcium Carbonate (CALCIUM 500 PO) Take 500  mg by mouth in the morning and at bedtime.     cholecalciferol (VITAMIN D3) 25 MCG (1000 UNIT) tablet Take 1,000 Units by mouth daily.     Cyanocobalamin (B-12) 2500 MCG TABS Take 2,500 mcg by mouth daily.     diazepam (VALIUM) 5 MG tablet Take 5 mg by mouth daily as needed for anxiety.     diltiazem (CARDIZEM) 30 MG tablet Take 1 table as needed for palpitations. Can take 2nd table after 3 hours if persistent palpitations. 30 tablet 1   ELIQUIS 5 MG TABS tablet TAKE 1 TABLET BY MOUTH TWICE A DAY 180 tablet 1   melatonin 5 MG TABS Take 5 mg by mouth at bedtime as needed (sleep).     metoprolol succinate (TOPROL-XL) 25 MG 24 hr tablet Take 12.5 mg by mouth daily.     neomycin-polymyxin-hydrocortisone (CORTISPORIN) OTIC solution Place 3 drops into both ears 4 (four) times daily. 10 mL 0   omeprazole (PRILOSEC) 20 MG capsule Take 20 mg by mouth every evening.     traMADol (ULTRAM) 50 MG tablet Take 1 tablet (50 mg total) by mouth every 6 (six) hours as needed for moderate pain. 30 tablet 0   traZODone (DESYREL) 50 MG tablet Take 0.5-1 tablets (25-50 mg total) by mouth at bedtime as needed. for sleep 90 tablet 3   zinc gluconate 50 MG tablet Take 50 mg by mouth daily.     No current facility-administered medications on file prior to visit.   Allergies  Allergen Reactions   Penicillins Anaphylaxis, Itching,  Swelling and Rash    Has patient had a PCN reaction causing immediate rash, facial/tongue/throat swelling, SOB or lightheadedness with hypotension: Yes Has patient had a PCN reaction causing severe rash involving mucus membranes or skin necrosis: No Has patient had a PCN reaction that required hospitalization: Yes Has patient had a PCN reaction occurring within the last 10 years: No If all of the above answers are "NO", then may proceed with Cephalosporin use.    Cefaclor Itching, Swelling and Rash   Latex Itching and Rash   Metronidazole Itching, Swelling and Rash   Naproxen Itching, Swelling and Rash   Nsaids Itching, Swelling and Rash    Ibuprofen IS tolerated   Septra [Bactrim] Itching, Swelling and Rash   Sulfonamide Derivatives Itching, Swelling and Rash   Tape Itching and Rash    ONLY USE PAPER TAPE   Social History   Socioeconomic History   Marital status: Married    Spouse name: Not on file   Number of children: 1   Years of education: Not on file   Highest education level: Not on file  Occupational History   Occupation: retired truck Orthoptist: RETIRED  Tobacco Use   Smoking status: Former    Packs/day: 0.50    Years: 46.00    Pack years: 23.00    Types: Cigarettes    Quit date: 02/13/2011    Years since quitting: 10.1   Smokeless tobacco: Never  Vaping Use   Vaping Use: Never used  Substance and Sexual Activity   Alcohol use: No   Drug use: No   Sexual activity: Not on file  Other Topics Concern   Not on file  Social History Narrative   Not on file   Social Determinants of Health   Financial Resource Strain: Not on file  Food Insecurity: Not on file  Transportation Needs: Not on file  Physical Activity: Not on file  Stress: Not on file  Social Connections: Not on file  Intimate Partner Violence: Not on file     Review of Systems  All other systems reviewed and are negative.     Objective:   Physical Exam Vitals reviewed.   Constitutional:      General: She is not in acute distress.    Appearance: She is well-developed. She is not diaphoretic.  HENT:     Head: Normocephalic and atraumatic.     Right Ear: External ear normal. Drainage present.     Left Ear: External ear normal. Decreased hearing noted. Drainage and swelling present.     Mouth/Throat:     Pharynx: No oropharyngeal exudate.  Eyes:     Conjunctiva/sclera: Conjunctivae normal.     Pupils: Pupils are equal, round, and reactive to light.  Neck:     Thyroid: No thyromegaly.     Vascular: No JVD.  Cardiovascular:     Rate and Rhythm: Normal rate and regular rhythm.     Heart sounds: Normal heart sounds. No murmur heard.   No friction rub. No gallop.  Pulmonary:     Effort: Pulmonary effort is normal. No respiratory distress.     Breath sounds: Normal breath sounds. No stridor. No wheezing or rales.  Abdominal:     General: Bowel sounds are normal. There is no distension.     Palpations: Abdomen is soft.     Tenderness: There is no abdominal tenderness. There is no guarding.  Musculoskeletal:     Cervical back: Normal range of motion.     Lumbar back: Spasms, tenderness and bony tenderness present. Decreased range of motion. Negative right straight leg raise test and negative left straight leg raise test.       Back:     Right hip: No tenderness or bony tenderness. Normal range of motion. Normal strength.     Left hip: Tenderness and bony tenderness present. Normal range of motion. Normal strength.       Legs:  Lymphadenopathy:     Cervical: No cervical adenopathy.  Neurological:     Mental Status: She is alert and oriented to person, place, and time.     Cranial Nerves: No cranial nerve deficit.     Sensory: No sensory deficit.     Motor: Tremor present. No atrophy, abnormal muscle tone or seizure activity.     Deep Tendon Reflexes: Reflexes are normal and symmetric.          Assessment & Plan:  Dysuria - Plan: Urinalysis,  Routine w reflex microscopic, Urine Culture  Acute diffuse otitis externa of both ears - Plan: Ambulatory referral to ENT  Other microscopic hematuria Urinalysis only shows microscopic hematuria.  Have asked the patient to come back in a week and repeat a urine sample to see if the blood is persistent.  If persistent we may need to consult urology for cystoscopy.  I will send a urine culture and if urine culture is positive for any infection we can certainly treat that however I Minna treat the patient for recalcitrant otitis externa with Levaquin 500 mg daily for 7 days.  Meanwhile I will consult ENT.  If it does not respond to the Levaquin, she may benefit from desiccating powder and topical antibiotics applied at the ENT office.

## 2021-04-12 LAB — URINE CULTURE
MICRO NUMBER:: 12578063
Result:: NO GROWTH
SPECIMEN QUALITY:: ADEQUATE

## 2021-04-13 ENCOUNTER — Encounter: Payer: Self-pay | Admitting: Family Medicine

## 2021-04-13 NOTE — Pre-Procedure Instructions (Signed)
Instructed patient on the following items: Arrival time 0530 Nothing to eat or drink after midnight No meds AM of procedure Responsible person to drive you home and stay with you for 24 hrs Wash with special soap night before and morning of procedure If on anti-coagulant drug instructions Eliquis- hasn't missed any doses 

## 2021-04-14 ENCOUNTER — Encounter (HOSPITAL_COMMUNITY)
Admission: RE | Disposition: A | Discharge status: Home / Self Care | Payer: Medicare Other | Source: Ambulatory Visit | Attending: Cardiology

## 2021-04-14 ENCOUNTER — Other Ambulatory Visit: Payer: Self-pay

## 2021-04-14 ENCOUNTER — Ambulatory Visit (HOSPITAL_COMMUNITY)
Admission: RE | Admit: 2021-04-14 | Discharge: 2021-04-14 | Disposition: A | Discharge status: Home / Self Care | Payer: Medicare Other | Source: Ambulatory Visit | Attending: Cardiology | Admitting: Cardiology

## 2021-04-14 ENCOUNTER — Encounter (HOSPITAL_COMMUNITY): Payer: Self-pay | Admitting: Cardiology

## 2021-04-14 ENCOUNTER — Ambulatory Visit (HOSPITAL_COMMUNITY): Payer: Medicare Other | Admitting: Anesthesiology

## 2021-04-14 DIAGNOSIS — I25110 Atherosclerotic heart disease of native coronary artery with unstable angina pectoris: Secondary | ICD-10-CM | POA: Diagnosis not present

## 2021-04-14 DIAGNOSIS — E785 Hyperlipidemia, unspecified: Secondary | ICD-10-CM | POA: Insufficient documentation

## 2021-04-14 DIAGNOSIS — Z888 Allergy status to other drugs, medicaments and biological substances status: Secondary | ICD-10-CM | POA: Diagnosis not present

## 2021-04-14 DIAGNOSIS — Z882 Allergy status to sulfonamides status: Secondary | ICD-10-CM | POA: Insufficient documentation

## 2021-04-14 DIAGNOSIS — Z880 Allergy status to penicillin: Secondary | ICD-10-CM | POA: Insufficient documentation

## 2021-04-14 DIAGNOSIS — I480 Paroxysmal atrial fibrillation: Secondary | ICD-10-CM | POA: Insufficient documentation

## 2021-04-14 DIAGNOSIS — Z8249 Family history of ischemic heart disease and other diseases of the circulatory system: Secondary | ICD-10-CM | POA: Diagnosis not present

## 2021-04-14 DIAGNOSIS — Z886 Allergy status to analgesic agent status: Secondary | ICD-10-CM | POA: Diagnosis not present

## 2021-04-14 DIAGNOSIS — Z87891 Personal history of nicotine dependence: Secondary | ICD-10-CM | POA: Diagnosis not present

## 2021-04-14 DIAGNOSIS — I2510 Atherosclerotic heart disease of native coronary artery without angina pectoris: Secondary | ICD-10-CM | POA: Diagnosis not present

## 2021-04-14 DIAGNOSIS — I10 Essential (primary) hypertension: Secondary | ICD-10-CM | POA: Diagnosis not present

## 2021-04-14 DIAGNOSIS — I129 Hypertensive chronic kidney disease with stage 1 through stage 4 chronic kidney disease, or unspecified chronic kidney disease: Secondary | ICD-10-CM | POA: Diagnosis not present

## 2021-04-14 DIAGNOSIS — I4891 Unspecified atrial fibrillation: Secondary | ICD-10-CM | POA: Diagnosis not present

## 2021-04-14 DIAGNOSIS — N182 Chronic kidney disease, stage 2 (mild): Secondary | ICD-10-CM | POA: Diagnosis not present

## 2021-04-14 DIAGNOSIS — Z881 Allergy status to other antibiotic agents status: Secondary | ICD-10-CM | POA: Insufficient documentation

## 2021-04-14 HISTORY — PX: ATRIAL FIBRILLATION ABLATION: EP1191

## 2021-04-14 LAB — POCT ACTIVATED CLOTTING TIME
Activated Clotting Time: 358 seconds
Activated Clotting Time: 387 seconds

## 2021-04-14 SURGERY — ATRIAL FIBRILLATION ABLATION
Anesthesia: General

## 2021-04-14 MED ORDER — HEPARIN (PORCINE) IN NACL 1000-0.9 UT/500ML-% IV SOLN
INTRAVENOUS | Status: AC
  Filled 2021-04-14: qty 500

## 2021-04-14 MED ORDER — FENTANYL CITRATE (PF) 250 MCG/5ML IJ SOLN
INTRAMUSCULAR | Status: DC | PRN
  Administered 2021-04-14: 50 ug via INTRAVENOUS

## 2021-04-14 MED ORDER — PROPOFOL 10 MG/ML IV BOLUS
INTRAVENOUS | Status: DC | PRN
  Administered 2021-04-14: 70 mg via INTRAVENOUS
  Administered 2021-04-14: 50 mg via INTRAVENOUS

## 2021-04-14 MED ORDER — ACETAMINOPHEN 325 MG PO TABS
650.0000 mg | ORAL_TABLET | ORAL | Status: DC | PRN
  Administered 2021-04-14: 650 mg via ORAL
  Filled 2021-04-14: qty 2

## 2021-04-14 MED ORDER — SODIUM CHLORIDE 0.9 % IV SOLN: INTRAVENOUS | Status: DC

## 2021-04-14 MED ORDER — DOPAMINE-DEXTROSE 1.6-5 MG/ML-% IV SOLN: INTRAVENOUS | Status: DC | PRN

## 2021-04-14 MED ORDER — ACETAMINOPHEN 325 MG PO TABS
ORAL_TABLET | ORAL | Status: AC
  Filled 2021-04-14: qty 2

## 2021-04-14 MED ORDER — HEPARIN SODIUM (PORCINE) 1000 UNIT/ML IJ SOLN
INTRAMUSCULAR | Status: DC | PRN
  Administered 2021-04-14: 14000 [IU] via INTRAVENOUS

## 2021-04-14 MED ORDER — ONDANSETRON HCL 4 MG/2ML IJ SOLN
INTRAMUSCULAR | Status: DC | PRN
  Administered 2021-04-14: 4 mg via INTRAVENOUS

## 2021-04-14 MED ORDER — PROTAMINE SULFATE 10 MG/ML IV SOLN
INTRAVENOUS | Status: DC | PRN
  Administered 2021-04-14 (×2): 10 mg via INTRAVENOUS
  Administered 2021-04-14: 20 mg via INTRAVENOUS

## 2021-04-14 MED ORDER — SUGAMMADEX SODIUM 200 MG/2ML IV SOLN
INTRAVENOUS | Status: DC | PRN
  Administered 2021-04-14: 150 mg via INTRAVENOUS

## 2021-04-14 MED ORDER — HEPARIN SODIUM (PORCINE) 1000 UNIT/ML IJ SOLN
INTRAMUSCULAR | Status: DC | PRN
  Administered 2021-04-14: 1000 [IU] via INTRAVENOUS

## 2021-04-14 MED ORDER — ONDANSETRON HCL 4 MG/2ML IJ SOLN: 4.0000 mg | Freq: Four times a day (QID) | INTRAMUSCULAR | Status: DC | PRN

## 2021-04-14 MED ORDER — SODIUM CHLORIDE 0.9 % IV SOLN: 250.0000 mL | INTRAVENOUS | Status: DC | PRN

## 2021-04-14 MED ORDER — SODIUM CHLORIDE 0.9% FLUSH: 3.0000 mL | Freq: Two times a day (BID) | INTRAVENOUS | Status: DC

## 2021-04-14 MED ORDER — HEPARIN SODIUM (PORCINE) 1000 UNIT/ML IJ SOLN
INTRAMUSCULAR | Status: AC
  Filled 2021-04-14: qty 1

## 2021-04-14 MED ORDER — LIDOCAINE 2% (20 MG/ML) 5 ML SYRINGE
INTRAMUSCULAR | Status: DC | PRN
  Administered 2021-04-14: 40 mg via INTRAVENOUS

## 2021-04-14 MED ORDER — SODIUM CHLORIDE 0.9% FLUSH: 3.0000 mL | INTRAVENOUS | Status: DC | PRN

## 2021-04-14 MED ORDER — EPHEDRINE SULFATE-NACL 50-0.9 MG/10ML-% IV SOSY
PREFILLED_SYRINGE | INTRAVENOUS | Status: DC | PRN
  Administered 2021-04-14 (×2): 5 mg via INTRAVENOUS

## 2021-04-14 MED ORDER — DEXAMETHASONE SODIUM PHOSPHATE 10 MG/ML IJ SOLN
INTRAMUSCULAR | Status: DC | PRN
  Administered 2021-04-14: 5 mg via INTRAVENOUS

## 2021-04-14 MED ORDER — DOBUTAMINE INFUSION FOR EP/ECHO/NUC (1000 MCG/ML)
INTRAVENOUS | Status: DC | PRN
  Administered 2021-04-14: 20 ug/kg/min via INTRAVENOUS

## 2021-04-14 MED ORDER — PHENYLEPHRINE 40 MCG/ML (10ML) SYRINGE FOR IV PUSH (FOR BLOOD PRESSURE SUPPORT)
PREFILLED_SYRINGE | INTRAVENOUS | Status: DC | PRN
  Administered 2021-04-14: 120 ug via INTRAVENOUS

## 2021-04-14 MED ORDER — ROCURONIUM BROMIDE 10 MG/ML (PF) SYRINGE
PREFILLED_SYRINGE | INTRAVENOUS | Status: DC | PRN
  Administered 2021-04-14: 20 mg via INTRAVENOUS
  Administered 2021-04-14: 40 mg via INTRAVENOUS

## 2021-04-14 MED ORDER — HEPARIN (PORCINE) IN NACL 1000-0.9 UT/500ML-% IV SOLN
INTRAVENOUS | Status: DC | PRN
  Administered 2021-04-14 (×5): 500 mL

## 2021-04-14 MED ORDER — DOBUTAMINE INFUSION FOR EP/ECHO/NUC (1000 MCG/ML)
INTRAVENOUS | Status: AC
  Filled 2021-04-14: qty 250

## 2021-04-14 SURGICAL SUPPLY — 21 items
BAG SNAP BAND KOVER 36X36 (MISCELLANEOUS) ×1 IMPLANT
BLANKET WARM UNDERBOD FULL ACC (MISCELLANEOUS) ×2 IMPLANT
CATH 8FR REPROCESSED SOUNDSTAR (CATHETERS) ×2 IMPLANT
CATH 8FR SOUNDSTAR REPROCESSED (CATHETERS) IMPLANT
CATH OCTARAY 2.0 F 3-3-3-3-3 (CATHETERS) ×1 IMPLANT
CATH S CIRCA THERM PROBE 10F (CATHETERS) ×1 IMPLANT
CATH SMTCH THERMOCOOL SF DF (CATHETERS) ×1 IMPLANT
CATH WEB BI DIR CSDF CRV REPRO (CATHETERS) ×1 IMPLANT
CLOSURE PERCLOSE PROSTYLE (VASCULAR PRODUCTS) ×4 IMPLANT
COVER SWIFTLINK CONNECTOR (BAG) ×2 IMPLANT
KIT VERSACROSS STEERABLE D1 (CATHETERS) ×1 IMPLANT
PACK EP LATEX FREE (CUSTOM PROCEDURE TRAY) ×2
PACK EP LF (CUSTOM PROCEDURE TRAY) ×1 IMPLANT
PAD PRO RADIOLUCENT 2001M-C (PAD) ×2 IMPLANT
PATCH CARTO3 (PAD) ×1 IMPLANT
SHEATH CARTO VIZIGO SM CVD (SHEATH) ×1 IMPLANT
SHEATH PINNACLE 7F 10CM (SHEATH) ×1 IMPLANT
SHEATH PINNACLE 8F 10CM (SHEATH) ×2 IMPLANT
SHEATH PINNACLE 9F 10CM (SHEATH) ×1 IMPLANT
SHEATH PROBE COVER 6X72 (BAG) ×1 IMPLANT
TUBING SMART ABLATE COOLFLOW (TUBING) ×1 IMPLANT

## 2021-04-14 NOTE — Anesthesia Postprocedure Evaluation (Signed)
Anesthesia Post Note  Patient: Brittany Kirk  Procedure(s) Performed: ATRIAL FIBRILLATION ABLATION     Patient location during evaluation: PACU Anesthesia Type: General Level of consciousness: awake and alert Pain management: pain level controlled Vital Signs Assessment: post-procedure vital signs reviewed and stable Respiratory status: spontaneous breathing, nonlabored ventilation, respiratory function stable and patient connected to nasal cannula oxygen Cardiovascular status: blood pressure returned to baseline and stable Postop Assessment: no apparent nausea or vomiting Anesthetic complications: no   There were no known notable events for this encounter.  Last Vitals:  Vitals:   04/14/21 1032 04/14/21 1035  BP:  (!) 124/39  Pulse:  62  Resp:  14  Temp: (!) 36.3 C   SpO2:  100%    Last Pain:  Vitals:   04/14/21 1032  TempSrc: Temporal  PainSc:                  Ayren Zumbro L Chaze Hruska

## 2021-04-14 NOTE — Transfer of Care (Signed)
Immediate Anesthesia Transfer of Care Note  Patient: Brittany Kirk  Procedure(s) Performed: ATRIAL FIBRILLATION ABLATION  Patient Location: Cath Lab  Anesthesia Type:General  Level of Consciousness: oriented, drowsy and patient cooperative  Airway & Oxygen Therapy: Patient Spontanous Breathing and Patient connected to nasal cannula oxygen  Post-op Assessment: Report given to RN and Post -op Vital signs reviewed and stable  Post vital signs: Reviewed  Last Vitals:  Vitals Value Taken Time  BP 135/49 04/14/21 0958  Temp    Pulse 65 04/14/21 0958  Resp 16 04/14/21 0958  SpO2 100 % 04/14/21 0958  Vitals shown include unvalidated device data.  Last Pain:  Vitals:   04/14/21 0607  TempSrc:   PainSc: 0-No pain      Patients Stated Pain Goal: 3 (38/32/91 [id number])  Complications: There were no known notable events for this encounter.

## 2021-04-14 NOTE — Anesthesia Preprocedure Evaluation (Addendum)
Anesthesia Evaluation  Patient identified by MRN, date of birth, ID band Patient awake    Reviewed: Allergy & Precautions, NPO status , Patient's Chart, lab work & pertinent test results, reviewed documented beta blocker date and time   Airway Mallampati: I  TM Distance: >3 FB Neck ROM: Full    Dental  (+) Edentulous Upper, Dental Advisory Given   Pulmonary COPD, former smoker,    Pulmonary exam normal breath sounds clear to auscultation       Cardiovascular hypertension, Pt. on medications and Pt. on home beta blockers + angina + CAD and + Peripheral Vascular Disease  Normal cardiovascular exam+ dysrhythmias (on eliquis) Atrial Fibrillation  Rhythm:Regular Rate:Normal  TTE 2022 1. Left ventricular ejection fraction, by estimation, is 60 to 65%. The  left ventricle has normal function. The left ventricle has no regional  wall motion abnormalities. Left ventricular diastolic parameters were  normal.  2. Right ventricular systolic function is normal. The right ventricular  size is normal.  3. The mitral valve is normal in structure. No evidence of mitral valve  regurgitation.  4. The aortic valve is normal in structure. Aortic valve regurgitation is  not visualized. No aortic stenosis is present  Cath 2019 1. Single vessel obstructive CAD involving the ostium of the ramus intermediate branch. 2. Normal LV function 3. Normal LVEDP    Neuro/Psych PSYCHIATRIC DISORDERS Anxiety Depression negative neurological ROS     GI/Hepatic Neg liver ROS, hiatal hernia, GERD  ,  Endo/Other  negative endocrine ROS  Renal/GU Renal InsufficiencyRenal diseaseLab Results      Component                Value               Date                      CREATININE               1.03 (H)            03/31/2021                BUN                      20                  03/31/2021                NA                       140                  03/31/2021                K                        4.4                 03/31/2021                CL                       103                 03/31/2021                CO2  24                  03/31/2021             negative genitourinary   Musculoskeletal negative musculoskeletal ROS (+)   Abdominal   Peds  Hematology negative hematology ROS (+)   Anesthesia Other Findings   Reproductive/Obstetrics                           Anesthesia Physical Anesthesia Plan  ASA: 3  Anesthesia Plan: General   Post-op Pain Management:    Induction: Intravenous  PONV Risk Score and Plan: 3 and Dexamethasone, Ondansetron and Midazolam  Airway Management Planned: Oral ETT  Additional Equipment:   Intra-op Plan:   Post-operative Plan: Extubation in OR  Informed Consent: I have reviewed the patients History and Physical, chart, labs and discussed the procedure including the risks, benefits and alternatives for the proposed anesthesia with the patient or authorized representative who has indicated his/her understanding and acceptance.     Dental advisory given  Plan Discussed with: CRNA  Anesthesia Plan Comments:        Anesthesia Quick Evaluation

## 2021-04-14 NOTE — H&P (Signed)
Electrophysiology Office Note   Date:  04/14/2021   ID:  Brittany Kirk, DOB 11/04/1945, MRN [id number]  PCP:  Susy Frizzle, MD  Cardiologist:  Gwenlyn Found Primary Electrophysiologist:  Benjaman Artman Meredith Leeds, MD    Chief Complaint: AF   History of Present Illness: Brittany Kirk is a 75 y.o. female who is being seen today for the evaluation of AF at the request of No ref. provider found. Presenting today for electrophysiology evaluation.  She has a history significant for coronary artery disease, paroxysmal atrial fibrillation, hypertension, hyperlipidemia, carotid artery disease, breast cancer status postlumpectomy in 2015.  Catheterization 2019 showed single-vessel obstructive coronary disease in the ostium of the ramus.  Medical therapy was recommended.  She presented to the hospital August 2022 with atrial fibrillation.  She awoke from sleep with chest pain and palpitations.  She spontaneously converted to sinus rhythm.  Today, denies symptoms of palpitations, chest pain, shortness of breath, orthopnea, PND, lower extremity edema, claudication, dizziness, presyncope, syncope, bleeding, or neurologic sequela. The patient is tolerating medications without difficulties. Ablation today.    Past Medical History:  Diagnosis Date   Acid reflux    Allergic rhinitis    Anxiety    Asymptomatic bilateral carotid artery stenosis    40-59% (02/2018)   Atrial fibrillation ([place]) 07/2019   Breast cancer ([place]) 2015   Right Breast Cancer   CAD (coronary artery disease)    Depression    Emphysema    no longer uses inhalers   Gallstones    nausea, pain upper right abdomen   GERD (gastroesophageal reflux disease)    H/O hiatal hernia    Hiatal hernia    History of skin cancer    Hyperlipidemia    Hypertension    Multinodular goiter (nontoxic)    Scoliosis    Skin cancer    Tobacco user    Wears dentures    top   Past Surgical History:  Procedure Laterality Date   BREAST LUMPECTOMY  Right 2015   CHOLECYSTECTOMY  07/08/2012   Procedure: LAPAROSCOPIC CHOLECYSTECTOMY WITH INTRAOPERATIVE CHOLANGIOGRAM;  Surgeon: Gayland Curry, MD,FACS;  Location: [facility];  Service: General;  Laterality: N/A;  Laparoscopic Cholecystectomy with Intraoperative Cholangiogram   COLONOSCOPY     ESOPHAGOSCOPY N/A 05/29/2017   Procedure: ESOPHAGOSCOPY;  Surgeon: Clarene Essex, MD;  Location: [facility];  Service: Endoscopy;  Laterality: N/A;  botox injection   HAND SURGERY Left    wrist   LEFT HEART CATH AND CORONARY ANGIOGRAPHY N/A 10/11/2017   Procedure: LEFT HEART CATH AND CORONARY ANGIOGRAPHY;  Surgeon: Martinique, Peter M, MD;  Location: [facility];  Service: Cardiovascular;  Laterality: N/A;   TONSILECTOMY, ADENOIDECTOMY, BILATERAL MYRINGOTOMY AND TUBES     TUBAL LIGATION       Current Facility-Administered Medications  Medication Dose Route Frequency Provider Last Rate Last Admin   0.9 %  sodium chloride infusion   Intravenous Continuous Constance Haw, MD 50 mL/hr at 04/14/21 0615 New Bag at 04/14/21 0615    Allergies:   Penicillins, Levofloxacin, Cefaclor, Latex, Metronidazole, Naproxen, Nsaids, Septra [bactrim], Sulfonamide derivatives, and Tape   Social History:  The patient  reports that she quit smoking about 10 years ago. Her smoking use included cigarettes. She has a 23.00 pack-year smoking history. She has never used smokeless tobacco. She reports that she does not drink alcohol and does not use drugs.   Family History:  The patient's family history includes Aplastic anemia in an other  family member; Arthritis in her mother; Diverticulitis in her sister; Heart disease in her sister and another family member; Pancreatic cancer in her mother.   ROS:  Please see the history of present illness.   Otherwise, review of systems is positive for none.   All other systems are reviewed and negative.   PHYSICAL EXAM: VS:  BP (!) 197/62   Pulse (!) 52   Temp (!) 97.5 F (36.4 C)  (Oral)   Resp 15   Ht 4\' 11"  (1.499 m)   Wt 56.2 kg   SpO2 100%   BMI 25.04 kg/m  , BMI Body mass index is 25.04 kg/m. GEN: Well nourished, well developed, in no acute distress  HEENT: normal  Neck: no JVD, carotid bruits, or masses Cardiac: RRR; no murmurs, rubs, or gallops,no edema  Respiratory:  clear to auscultation bilaterally, normal work of breathing GI: soft, nontender, nondistended, + BS MS: no deformity or atrophy  Skin: warm and dry Neuro:  Strength and sensation are intact Psych: euthymic mood, full affect   Recent Labs: 08/01/2020: ALT 19 01/13/2021: TSH 0.489 03/31/2021: BUN 20; Creatinine, Ser 1.03; Hemoglobin 11.8; Platelets 332; Potassium 4.4; Sodium 140    Lipid Panel     Component Value Date/Time   CHOL 133 07/15/2019 0934   CHOL 124 02/27/2017 0940   TRIG 48 07/15/2019 0934   HDL 59 07/15/2019 0934   HDL 58 02/27/2017 0940   CHOLHDL 2.3 07/15/2019 0934   VLDL 10 07/15/2019 0934   LDLCALC 64 07/15/2019 0934   LDLCALC 61 07/14/2018 1149     Wt Readings from Last 3 Encounters:  04/14/21 56.2 kg  04/03/21 56.2 kg  03/07/21 56.6 kg      Other studies Reviewed: Additional studies/ records that were reviewed today include: TTE 01/13/21  Review of the above records today demonstrates:    1. Left ventricular ejection fraction, by estimation, is 60 to 65%. The  left ventricle has normal function. The left ventricle has no regional  wall motion abnormalities. Left ventricular diastolic parameters were  normal.   2. Right ventricular systolic function is normal. The right ventricular  size is normal.   3. The mitral valve is normal in structure. No evidence of mitral valve  regurgitation.   4. The aortic valve is normal in structure. Aortic valve regurgitation is  not visualized. No aortic stenosis is present.    ASSESSMENT AND PLAN:  1.  Paroxysmal atrial fibrillation: Brittany Kirk has presented today for surgery, with the diagnosis of AF.   The various methods of treatment have been discussed with the patient and family. After consideration of risks, benefits and other options for treatment, the patient has consented to  Procedure(s): Catheter ablation as a surgical intervention .  Risks include but not limited to complete heart block, stroke, esophageal damage, nerve damage, bleeding, vascular damage, tamponade, perforation, MI, and death. The patient's history has been reviewed, patient examined, no change in status, stable for surgery.  I have reviewed the patient's chart and labs.  Questions were answered to the patient's satisfaction.    Brittany Riding Brittany Bears, MD 04/14/2021 7:07 AM

## 2021-04-14 NOTE — Anesthesia Procedure Notes (Signed)
Procedure Name: Intubation Date/Time: 04/14/2021 7:50 AM Performed by: Jenne Campus, CRNA Pre-anesthesia Checklist: Patient identified, Emergency Drugs available, Suction available and Patient being monitored Patient Re-evaluated:Patient Re-evaluated prior to induction Oxygen Delivery Method: Circle System Utilized Preoxygenation: Pre-oxygenation with 100% oxygen Induction Type: IV induction Ventilation: Mask ventilation without difficulty Laryngoscope Size: Miller and 2 Grade View: Grade I Tube type: Oral Tube size: 7.0 mm Number of attempts: 1 Airway Equipment and Method: Stylet and Oral airway Placement Confirmation: ETT inserted through vocal cords under direct vision, positive ETCO2 and breath sounds checked- equal and bilateral Secured at: 21 cm Tube secured with: Tape Dental Injury: Teeth and Oropharynx as per pre-operative assessment

## 2021-04-18 ENCOUNTER — Other Ambulatory Visit: Payer: Medicare Other

## 2021-04-18 ENCOUNTER — Other Ambulatory Visit: Payer: Self-pay

## 2021-04-18 DIAGNOSIS — R319 Hematuria, unspecified: Secondary | ICD-10-CM | POA: Diagnosis not present

## 2021-04-19 LAB — URINALYSIS, ROUTINE W REFLEX MICROSCOPIC
Bilirubin Urine: NEGATIVE
Glucose, UA: NEGATIVE
Hgb urine dipstick: NEGATIVE
Ketones, ur: NEGATIVE
Leukocytes,Ua: NEGATIVE
Nitrite: NEGATIVE
Protein, ur: NEGATIVE
Specific Gravity, Urine: 1.01 (ref 1.001–1.035)
pH: <=5 (ref 5.0–8.0)

## 2021-04-19 LAB — URINE CULTURE
MICRO NUMBER:: 12609080
SPECIMEN QUALITY:: ADEQUATE

## 2021-04-20 ENCOUNTER — Telehealth: Payer: Self-pay | Admitting: Cardiovascular Disease

## 2021-04-20 NOTE — Telephone Encounter (Signed)
Spoke to patient . She is calling because heart rate is fluctuating  30's to 40's range.  Heart is obtained manual by  her husband.  Patient states her  daughter and son - in law was concerned.   She also state blood pressures are going up and down.   Todays' reading  173/72  pulse 36  @8  ish Heart increase to 40  @ 8:45 am     04/19/21  Blood pressure 178/72 pulse  04/18/21 8:34 am  185/61 9:39 129/55 11:33 am 144/55   Patient ranges  pulse 42- 57    Patient is taking  12. 5 mg  Metoprolol succinate 12.5 mg   She also states feet legs and ankle swelling - the are normal when she awake and as the day progress they become swollen. Patient states she is not using compression sock at present time  but elevate her legs for about 30- 40 minutes a day. Rn recommend patient  to reapply compression socks while awake and take them off at bedtime.  Will defer to Dr Gwenlyn Found and doctor of the day

## 2021-04-20 NOTE — Telephone Encounter (Signed)
Spoke to patient -- offered an appointment for 04/21/21. Patient decline due to husband having  another appointment.  Schedule next available on  04/25/21 at 4:30 pm.   Patient states she is wearing compression hose  "feeling little better"  Patient aware if symptoms worsen to call 911. Patient verbalized understanding.

## 2021-04-20 NOTE — Telephone Encounter (Signed)
STAT if HR is under 50 or over 120 (normal HR is 60-100 beats per minute)  What is your heart rate? In the thirties and forties since 04-17-21- getting worse every day today it was 50 and 40  Do you have a log of your heart rate readings (document readings)?   Do you have any other symptoms? Blood pressure been fluctuating,  feet, legs and ankles are swollen

## 2021-04-25 ENCOUNTER — Ambulatory Visit (INDEPENDENT_AMBULATORY_CARE_PROVIDER_SITE_OTHER): Payer: Medicare Other

## 2021-04-25 ENCOUNTER — Encounter: Payer: Self-pay | Admitting: Cardiovascular Disease

## 2021-04-25 ENCOUNTER — Ambulatory Visit (INDEPENDENT_AMBULATORY_CARE_PROVIDER_SITE_OTHER): Payer: Medicare Other | Admitting: Cardiovascular Disease

## 2021-04-25 ENCOUNTER — Other Ambulatory Visit: Payer: Self-pay

## 2021-04-25 VITALS — BP 183/67 | HR 68 | Ht 59.0 in | Wt 130.4 lb

## 2021-04-25 DIAGNOSIS — E782 Mixed hyperlipidemia: Secondary | ICD-10-CM

## 2021-04-25 DIAGNOSIS — I4891 Unspecified atrial fibrillation: Secondary | ICD-10-CM

## 2021-04-25 DIAGNOSIS — I6523 Occlusion and stenosis of bilateral carotid arteries: Secondary | ICD-10-CM

## 2021-04-25 DIAGNOSIS — R001 Bradycardia, unspecified: Secondary | ICD-10-CM | POA: Diagnosis not present

## 2021-04-25 DIAGNOSIS — I2510 Atherosclerotic heart disease of native coronary artery without angina pectoris: Secondary | ICD-10-CM | POA: Diagnosis not present

## 2021-04-25 DIAGNOSIS — I10 Essential (primary) hypertension: Secondary | ICD-10-CM | POA: Diagnosis not present

## 2021-04-25 MED ORDER — HYDROCHLOROTHIAZIDE 12.5 MG PO TABS: 12.5000 mg | ORAL_TABLET | Freq: Every day | ORAL | 1 refills | Status: DC

## 2021-04-25 NOTE — Progress Notes (Signed)
04/25/2021 Brittany Kirk   [birth year]  [id number]  Primary Physician Pickard, Cammie Mcgee, MD Primary Cardiologist: Lorretta Harp MD [name], [place]  HPI:  Brittany Kirk is a 75 y.o.  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 She was referred by Dr. Dennard Schaumann for cardiovascular evaluation because of chest pain. I last saw her in the office 03/07/2021.  Risk factors include 25 pack years of tobacco abuse having quit 3 years ago. She has treated hypertension as well as a strong family history of heart disease with multiple siblings who died at a premature age of myocardial infarction's. She has never had a heart attack or stroke. She has had a remote cardiac catheterization by Dr. Lia Foyer but no intervention was performed. She was complaining of some substernal chest pain with chronic nausea. This has since some sided after beginning on a proton pump inhibitor. She had a negative stress test 01/24/16 and again during a recent consultation for chest pain 01/05/17. She has had upper and lower endoscopy for abdominal pain and nausea which was unrevealing.   She had diagnostic coronary angiography performed by Dr. Martinique 5/19 showing moderately severe ramus branch ostial disease with noncritical disease otherwise a normal LV function.  This was unchanged from prior cath performed in 2010 it was thought that her chest pain was noncardiac in nature.   She was seen in the emergency room on 07/15/2019 for A. fib with RVR.  She converted spontaneously to sinus rhythm and was begun on apixaban by Dr. Harrell Gave.  She has had a few episodes of brief AF since but for the most part is asymptomatic from this.   She was admitted in early August with A. fib with RVR and converted.  Her troponins were mildly elevated.  Her 2D echo was normal.  She has had no recurrent episodes.  She she saw Dr.  Curt Bears for EP follow-up who has scheduled her for A. fib ablation on 04/14/2021.  Since her A. fib ablation she is noticed swelling in both her legs, palpitations and labile-hypertension.   Current Meds  Medication Sig   alendronate (FOSAMAX) 70 MG tablet Take 1 tablet (70 mg total) by mouth every 7 (seven) days. Take with a full glass of water on an empty stomach. (Patient taking differently: Take 70 mg by mouth every Sunday. Take with a full glass of water on an empty stomach.)   ascorbic acid (VITAMIN C) 500 MG tablet Take 500 mg by mouth daily.   atorvastatin (LIPITOR) 40 MG tablet TAKE 1 TABLET BY MOUTH EVERYDAY AT BEDTIME   Calcium Carbonate (CALCIUM 500 PO) Take 500 mg by mouth in the morning and at bedtime.   cholecalciferol (VITAMIN D3) 25 MCG (1000 UNIT) tablet Take 1,000 Units by mouth daily.   Cyanocobalamin (B-12) 2500 MCG TABS Take 2,500 mcg by mouth daily.   ELIQUIS 5 MG TABS tablet TAKE 1 TABLET BY MOUTH TWICE A DAY   hydrochlorothiazide (HYDRODIURIL) 12.5 MG tablet Take 1 tablet (12.5 mg total) by mouth daily.   metoprolol succinate (TOPROL-XL) 25 MG 24 hr tablet Take 12.5 mg by mouth daily.   omeprazole (PRILOSEC) 20 MG capsule Take 20 mg by mouth every evening.   traMADol (ULTRAM) 50 MG tablet Take 1 tablet (50 mg total) by mouth every 6 (six) hours as needed for moderate pain.   traZODone (DESYREL) 50  MG tablet Take 0.5-1 tablets (25-50 mg total) by mouth at bedtime as needed. for sleep   zinc gluconate 50 MG tablet Take 50 mg by mouth daily.     Allergies  Allergen Reactions   Penicillins Anaphylaxis, Itching, Swelling and Rash    Has patient had a PCN reaction causing immediate rash, facial/tongue/throat swelling, SOB or lightheadedness with hypotension: Yes Has patient had a PCN reaction causing severe rash involving mucus membranes or skin necrosis: No Has patient had a PCN reaction that required hospitalization: Yes Has patient had a PCN reaction occurring within  the last 10 years: No If all of the above answers are "NO", then may proceed with Cephalosporin use.    Levofloxacin Nausea Only and Other (See Comments)    Causes headache, fatigue and dizziness   Cefaclor Itching, Swelling and Rash   Latex Itching and Rash   Metronidazole Itching, Swelling and Rash   Naproxen Itching, Swelling and Rash   Nsaids Itching, Swelling and Rash    Ibuprofen IS tolerated   Septra [Bactrim] Itching, Swelling and Rash   Sulfonamide Derivatives Itching, Swelling and Rash   Tape Itching and Rash    ONLY USE PAPER TAPE    Social History   Socioeconomic History   Marital status: Married    Spouse name: Not on file   Number of children: 1   Years of education: Not on file   Highest education level: Not on file  Occupational History   Occupation: retired truck Orthoptist: RETIRED  Tobacco Use   Smoking status: Former    Packs/day: 0.50    Years: 46.00    Pack years: 23.00    Types: Cigarettes    Quit date: 02/13/2011    Years since quitting: 10.2   Smokeless tobacco: Never  Vaping Use   Vaping Use: Never used  Substance and Sexual Activity   Alcohol use: No   Drug use: No   Sexual activity: Not on file  Other Topics Concern   Not on file  Social History Narrative   Not on file   Social Determinants of Health   Financial Resource Strain: Not on file  Food Insecurity: Not on file  Transportation Needs: Not on file  Physical Activity: Not on file  Stress: Not on file  Social Connections: Not on file  Intimate Partner Violence: Not on file     Review of Systems: General: negative for chills, fever, night sweats or weight changes.  Cardiovascular: negative for chest pain, dyspnea on exertion, edema, orthopnea, palpitations, paroxysmal nocturnal dyspnea or shortness of breath Dermatological: negative for rash Respiratory: negative for cough or wheezing Urologic: negative for hematuria Abdominal: negative for nausea, vomiting,  diarrhea, bright red blood per rectum, melena, or hematemesis Neurologic: negative for visual changes, syncope, or dizziness All other systems reviewed and are otherwise negative except as noted above.    Blood pressure (!) 183/67, pulse 68, height 4\' 11"  (1.499 m), weight 130 lb 6.4 oz (59.1 kg).  General appearance: alert and no distress Neck: no adenopathy, no carotid bruit, no JVD, supple, symmetrical, trachea midline, and thyroid not enlarged, symmetric, no tenderness/mass/nodules Lungs: clear to auscultation bilaterally Heart: regular rate and rhythm, S1, S2 normal, no murmur, click, rub or gallop Extremities: extremities normal, atraumatic, no cyanosis or edema Pulses: 2+ and symmetric Skin: Skin color, texture, turgor normal. No rashes or lesions Neurologic: Grossly normal  EKG sinus rhythm at 68 with bigeminal PVCs.  I personally reviewed  this EKG.  ASSESSMENT AND PLAN:   CAD in native artery History of CAD status post cardiac catheterization performed noxiously by Dr. Martinique 10/11/2017 revealing unchanged anatomy from the prior cath which was notable for ostial ramus branch disease.  Medical therapy was recommended.  Hyperlipidemia History of hyperlipidemia on atorvastatin with lipid profile performed 07/15/2019 revealing a total cholesterol 133, LDL 64 and HDL 59.  Essential hypertension History of essential pretension blood pressure measured today 128/66.  She is on metoprolol.  Bilateral carotid artery disease (HCC) History of carotid artery disease with Doppler performed 02/28/2021 revealing moderate bilateral ICA stenosis.  Carotid Dopplers will be repeated in 1 year.  Atrial fibrillation, rapid ([place]) History of PAF status post A. fib ablation performed by Dr. Curt Bears 04/14/2021.  Since that time she has noticed swelling in both of her legs, erratic blood pressures and slow heart rates.  I am going to put her on low-dose diuretic, check blood work on her in 7 to 10 days,  obtain a 1 week event monitor and a 2D echocardiogram.  I will see her back in 3 months for follow-up.     Lorretta Harp MD FACP,FACC,FAHA, [facility] 04/25/2021 4:55 PM

## 2021-04-25 NOTE — Assessment & Plan Note (Signed)
History of carotid artery disease with Doppler performed 02/28/2021 revealing moderate bilateral ICA stenosis.  Carotid Dopplers will be repeated in 1 year.

## 2021-04-25 NOTE — Assessment & Plan Note (Signed)
History of CAD status post cardiac catheterization performed noxiously by Dr. Martinique 10/11/2017 revealing unchanged anatomy from the prior cath which was notable for ostial ramus branch disease.  Medical therapy was recommended.

## 2021-04-25 NOTE — Patient Instructions (Signed)
Medication Instructions:   -Start taking hydrochlorothiazide (hydrodiuril) 12.5mg  once daily.  *If you need a refill on your cardiac medications before your next appointment, please call your pharmacy*   Lab Work: Your physician recommends that you return for lab work in: 7-10 days for [employer].  If you have labs (blood work) drawn today and your tests are completely normal, you will receive your results only by: [place] (if you have MyChart) OR A paper copy in the mail If you have any lab test that is abnormal or we need to change your treatment, we will call you to review the results.   Testing/Procedures: Your physician has requested that you have an echocardiogram. Echocardiography is a painless test that uses sound waves to create images of your heart. It provides your doctor with information about the size and shape of your heart and how well your heart's chambers and valves are working. This procedure takes approximately one hour. There are no restrictions for this procedure. This procedure is done at [address] Monitor Instructions  Your physician has requested you wear a ZIO patch monitor for 7 days.  This is a single patch monitor. [employer] supplies one patch monitor per enrollment. Additional stickers are not available. Please do not apply patch if you will be having a Nuclear Stress Test,  Echocardiogram, Cardiac CT, MRI, or Chest Xray during the period you would be wearing the  monitor. The patch cannot be worn during these tests. You cannot remove and re-apply the  ZIO XT patch monitor.  Your ZIO patch monitor will be mailed 3 day USPS to your address on file. It may take 3-5 days  to receive your monitor after you have been enrolled.  Once you have received your monitor, please review the enclosed instructions. Your monitor  has already been registered assigning a specific monitor serial # to you.  Billing and Patient Assistance Program  Information  We have supplied [employer] with any of your insurance information on file for billing purposes. [employer] offers a sliding scale Patient Assistance Program for patients that do not have  insurance, or whose insurance does not completely cover the cost of the ZIO monitor.  You must apply for the Patient Assistance Program to qualify for this discounted rate.  To apply, please call [employer] at [phone], select option 4, select option 2, ask to apply for  Patient Assistance Program. Theodore Demark will ask your household income, and how many people  are in your household. They will quote your out-of-pocket cost based on that information.  [employer] will also be able to set up a 38-month, interest-free payment plan if needed.  Applying the monitor   Shave hair from upper left chest.  Hold abrader disc by orange tab. Rub abrader in 40 strokes over the upper left chest as  indicated in your monitor instructions.  Clean area with 4 enclosed alcohol pads. Let dry.  Apply patch as indicated in monitor instructions. Patch will be placed under collarbone on left  side of chest with arrow pointing upward.  Rub patch adhesive wings for 2 minutes. Remove white label marked "1". Remove the white  label marked "2". Rub patch adhesive wings for 2 additional minutes.  While looking in a mirror, press and release button in center of patch. A small green light will  flash 3-4 times. This will be your only indicator that the monitor has been turned on.  Do not shower for the  first 24 hours. You may shower after the first 24 hours.  Press the button if you feel a symptom. You will hear a small click. Record Date, Time and  Symptom in the Patient Logbook.  When you are ready to remove the patch, follow instructions on the last 2 pages of Patient  Logbook. Stick patch monitor onto the last page of Patient Logbook.  Place Patient Logbook in the blue and white box. Use locking tab on box and tape box closed   securely. The blue and white box has prepaid postage on it. Please place it in the mailbox as  soon as possible. Your physician should have your test results approximately 7 days after the  monitor has been mailed back to [facility].  Call [place] at [phone] if you have questions regarding  your ZIO XT patch monitor. Call them immediately if you see an orange light blinking on your  monitor.  If your monitor falls off in less than 4 days, contact our Monitor department at [phone].  If your monitor becomes loose or falls off after 4 days call [employer] at [phone] for  suggestions on securing your monitor    Follow-Up: At [facility], you and your health needs are our priority.  As part of our continuing mission to provide you with exceptional heart care, we have created designated Provider Care Teams.  These Care Teams include your primary Cardiologist (physician) and Advanced Practice Providers (APPs -  Physician Assistants and Nurse Practitioners) who all work together to provide you with the care you need, when you need it.  We recommend signing up for the patient portal called "MyChart".  Sign up information is provided on this After Visit Summary.  MyChart is used to connect with patients for Virtual Visits (Telemedicine).  Patients are able to view lab/test results, encounter notes, upcoming appointments, etc.  Non-urgent messages can be sent to your provider as well.   To learn more about what you can do with MyChart, go to [email or web address].    Your next appointment:   3 month(s)  The format for your next appointment:   In Person  Provider:   [name], MD

## 2021-04-25 NOTE — Assessment & Plan Note (Signed)
History of essential pretension blood pressure measured today 128/66.  She is on metoprolol.

## 2021-04-25 NOTE — Assessment & Plan Note (Signed)
History of PAF status post A. fib ablation performed by Dr. Curt Bears 04/14/2021.  Since that time she has noticed swelling in both of her legs, erratic blood pressures and slow heart rates.  I am going to put her on low-dose diuretic, check blood work on her in 7 to 10 days, obtain a 1 week event monitor and a 2D echocardiogram.  I will see her back in 3 months for follow-up.

## 2021-04-25 NOTE — Progress Notes (Unsigned)
Enrolled patient for a 7 day Zio XT monitor to be mailed to patients home.  

## 2021-04-25 NOTE — Assessment & Plan Note (Signed)
History of hyperlipidemia on atorvastatin with lipid profile performed 07/15/2019 revealing a total cholesterol 133, LDL 64 and HDL 59.

## 2021-04-26 NOTE — Addendum Note (Signed)
Addended by: Beatrix Fetters on: 04/26/2021 11:30 AM   Modules accepted: Orders

## 2021-04-28 ENCOUNTER — Telehealth: Payer: Self-pay | Admitting: Cardiovascular Disease

## 2021-04-28 MED ORDER — SPIRONOLACTONE 25 MG PO TABS: 12.5000 mg | ORAL_TABLET | Freq: Every day | ORAL | 3 refills | Status: DC

## 2021-04-28 NOTE — Addendum Note (Signed)
Addended by: Beatrix Fetters on: 04/28/2021 05:03 PM   Modules accepted: Orders

## 2021-04-28 NOTE — Telephone Encounter (Signed)
Spoke with pt regarding new prescription for spironolactone. Prescription sent to pt's pharmacy. Pt verbalizes understanding. Pt will push lab appointment out to be within the 7-10 day window.

## 2021-04-28 NOTE — Telephone Encounter (Signed)
Pt seen by Dr. Gwenlyn Found 11/15. Monitor placed

## 2021-04-28 NOTE — Telephone Encounter (Signed)
Chance of cross reactivity with furosemide, torsemide, or bumetanide.  Recommend starting spironolactone.

## 2021-04-28 NOTE — Telephone Encounter (Signed)
Patient is still having swelling in her legs even with compression socks.  She states she is having PVC's.  She needs something to help reduce the swelling.  She was messaging via MyChart earlier in the week.

## 2021-04-28 NOTE — Telephone Encounter (Signed)
Patient returning call.

## 2021-04-28 NOTE — Telephone Encounter (Signed)
Left message for patient to return the call.

## 2021-04-28 NOTE — Telephone Encounter (Signed)
Left message for pt to call back  °

## 2021-04-28 NOTE — Telephone Encounter (Signed)
Pt returned call she states 90% of the day are PVCs. Blood pressure reading follow: 11/16 8:52 179/76 61 9:47 125/51 55 PVC 11/17 192/47 57 (no PVCs) 9:50 106/42 57 PVCs 11/18 8:44 177/72 60 PVCs 9:50 97/45 52 PVCs 9:56 109/42 55 PVCs  "Since I've had the ablation I have had PVCs everyday. They prescribed a fluid pill but I couldn't take it because I'm allergic to sulfa and it will send me to the hospital." PT want to know can se be prescribed another fluid pill. Will forward message to Dr. Gwenlyn Found and his nurse.

## 2021-04-29 DIAGNOSIS — R001 Bradycardia, unspecified: Secondary | ICD-10-CM

## 2021-05-01 NOTE — Telephone Encounter (Signed)
See telephone encounter.

## 2021-05-08 ENCOUNTER — Encounter: Payer: Self-pay | Admitting: Cardiovascular Disease

## 2021-05-09 DIAGNOSIS — I10 Essential (primary) hypertension: Secondary | ICD-10-CM | POA: Diagnosis not present

## 2021-05-10 LAB — BASIC METABOLIC PANEL
BUN/Creatinine Ratio: 21 (ref 12–28)
BUN: 22 mg/dL (ref 8–27)
CO2: 23 mmol/L (ref 20–29)
Calcium: 9.5 mg/dL (ref 8.7–10.3)
Chloride: 102 mmol/L (ref 96–106)
Creatinine, Ser: 1.03 mg/dL — ABNORMAL HIGH (ref 0.57–1.00)
Glucose: 94 mg/dL (ref 70–99)
Potassium: 4.8 mmol/L (ref 3.5–5.2)
Sodium: 141 mmol/L (ref 134–144)
eGFR: 56 mL/min/{1.73_m2} — ABNORMAL LOW (ref >59)

## 2021-05-11 ENCOUNTER — Ambulatory Visit (INDEPENDENT_AMBULATORY_CARE_PROVIDER_SITE_OTHER): Payer: Medicare Other

## 2021-05-11 ENCOUNTER — Other Ambulatory Visit: Payer: Self-pay

## 2021-05-11 VITALS — Ht 59.0 in | Wt 130.0 lb

## 2021-05-11 DIAGNOSIS — M8000XS Age-related osteoporosis with current pathological fracture, unspecified site, sequela: Secondary | ICD-10-CM | POA: Diagnosis not present

## 2021-05-11 DIAGNOSIS — Z0000 Encounter for general adult medical examination without abnormal findings: Secondary | ICD-10-CM | POA: Diagnosis not present

## 2021-05-11 DIAGNOSIS — Z09 Encounter for follow-up examination after completed treatment for conditions other than malignant neoplasm: Secondary | ICD-10-CM | POA: Diagnosis not present

## 2021-05-11 NOTE — Patient Instructions (Signed)
[name] , Thank you for taking time to come for your Medicare Wellness Visit. I appreciate your ongoing commitment to your health goals. Please review the following plan we discussed and let me know if I can assist you in the future.   Screening recommendations/referrals: Colonoscopy: Done 07/10/2017 Repeat in 10 years  Mammogram: Done 12/23/2020 Repeat annually  Bone Density: Order placed today to schedule after Jan 2023.  Recommended yearly ophthalmology/optometry visit for glaucoma screening and checkup Recommended yearly dental visit for hygiene and checkup  Vaccinations: Influenza vaccine: Repeat annually  Pneumococcal vaccine: Done 07/14/2013 and 02/22/2015  Tdap vaccine: Done 05/19/2012 Repeat in 10 years  Shingles vaccine: Shingrix discussed. Please contact your pharmacy for coverage information.  Call Pharmacy in Jan 2023.    Covid-19:Done 08/07/2019 and 09/01/2019  Advanced directives: Advance directive discussed with you today. Even though you declined this today, please call our office should you change your mind, and we can give you the proper paperwork for you to fill out.   Conditions/risks identified: Aim for 30 minutes of exercise or brisk walking each day, drink 6-8 glasses of water and eat lots of fruits and vegetables.   Next appointment: Follow up in one year for your annual wellness visit 2023   Preventive Care 65 Years and Older, Female Preventive care refers to lifestyle choices and visits with your health care provider that can promote health and wellness. What does preventive care include? A yearly physical exam. This is also called an annual well check. Dental exams once or twice a year. Routine eye exams. Ask your health care provider how often you should have your eyes checked. Personal lifestyle choices, including: Daily care of your teeth and gums. Regular physical activity. Eating a healthy diet. Avoiding tobacco and drug use. Limiting alcohol  use. Practicing safe sex. Taking low-dose aspirin every day. Taking vitamin and mineral supplements as recommended by your health care provider. What happens during an annual well check? The services and screenings done by your health care provider during your annual well check will depend on your age, overall health, lifestyle risk factors, and family history of disease. Counseling  Your health care provider may ask you questions about your: Alcohol use. Tobacco use. Drug use. Emotional well-being. Home and relationship well-being. Sexual activity. Eating habits. History of falls. Memory and ability to understand (cognition). Work and work Statistician. Reproductive health. Screening  You may have the following tests or measurements: Height, weight, and BMI. Blood pressure. Lipid and cholesterol levels. These may be checked every 5 years, or more frequently if you are over 23 years old. Skin check. Lung cancer screening. You may have this screening every year starting at age 50 if you have a 30-pack-year history of smoking and currently smoke or have quit within the past 15 years. Fecal occult blood test (FOBT) of the stool. You may have this test every year starting at age 74. Flexible sigmoidoscopy or colonoscopy. You may have a sigmoidoscopy every 5 years or a colonoscopy every 10 years starting at age 30. Hepatitis C blood test. Hepatitis B blood test. Sexually transmitted disease (STD) testing. Diabetes screening. This is done by checking your blood sugar (glucose) after you have not eaten for a while (fasting). You may have this done every 1-3 years. Bone density scan. This is done to screen for osteoporosis. You may have this done starting at age 80. Mammogram. This may be done every 1-2 years. Talk to your health care provider about how often  you should have regular mammograms. Talk with your health care provider about your test results, treatment options, and if necessary,  the need for more tests. Vaccines  Your health care provider may recommend certain vaccines, such as: Influenza vaccine. This is recommended every year. Tetanus, diphtheria, and acellular pertussis (Tdap, Td) vaccine. You may need a Td booster every 10 years. Zoster vaccine. You may need this after age 66. Pneumococcal 13-valent conjugate (PCV13) vaccine. One dose is recommended after age 72. Pneumococcal polysaccharide (PPSV23) vaccine. One dose is recommended after age 60. Talk to your health care provider about which screenings and vaccines you need and how often you need them. This information is not intended to replace advice given to you by your health care provider. Make sure you discuss any questions you have with your health care provider. Document Released: 06/24/2015 Document Revised: 02/15/2016 Document Reviewed: 03/29/2015 Elsevier Interactive Patient Education  2017 [place] Prevention in the Home Falls can cause injuries. They can happen to people of all ages. There are many things you can do to make your home safe and to help prevent falls. What can I do on the outside of my home? Regularly fix the edges of walkways and driveways and fix any cracks. Remove anything that might make you trip as you walk through a door, such as a raised step or threshold. Trim any bushes or trees on the path to your home. Use bright outdoor lighting. Clear any walking paths of anything that might make someone trip, such as rocks or tools. Regularly check to see if handrails are loose or broken. Make sure that both sides of any steps have handrails. Any raised decks and porches should have guardrails on the edges. Have any leaves, snow, or ice cleared regularly. Use sand or salt on walking paths during winter. Clean up any spills in your garage right away. This includes oil or grease spills. What can I do in the bathroom? Use night lights. Install grab bars by the toilet and in the  tub and shower. Do not use towel bars as grab bars. Use non-skid mats or decals in the tub or shower. If you need to sit down in the shower, use a plastic, non-slip stool. Keep the floor dry. Clean up any water that spills on the floor as soon as it happens. Remove soap buildup in the tub or shower regularly. Attach bath mats securely with double-sided non-slip rug tape. Do not have throw rugs and other things on the floor that can make you trip. What can I do in the bedroom? Use night lights. Make sure that you have a light by your bed that is easy to reach. Do not use any sheets or blankets that are too big for your bed. They should not hang down onto the floor. Have a firm chair that has side arms. You can use this for support while you get dressed. Do not have throw rugs and other things on the floor that can make you trip. What can I do in the kitchen? Clean up any spills right away. Avoid walking on wet floors. Keep items that you use a lot in easy-to-reach places. If you need to reach something above you, use a strong step stool that has a grab bar. Keep electrical cords out of the way. Do not use floor polish or wax that makes floors slippery. If you must use wax, use non-skid floor wax. Do not have throw rugs and other things on  the floor that can make you trip. What can I do with my stairs? Do not leave any items on the stairs. Make sure that there are handrails on both sides of the stairs and use them. Fix handrails that are broken or loose. Make sure that handrails are as long as the stairways. Check any carpeting to make sure that it is firmly attached to the stairs. Fix any carpet that is loose or worn. Avoid having throw rugs at the top or bottom of the stairs. If you do have throw rugs, attach them to the floor with carpet tape. Make sure that you have a light switch at the top of the stairs and the bottom of the stairs. If you do not have them, ask someone to add them for  you. What else can I do to help prevent falls? Wear shoes that: Do not have high heels. Have rubber bottoms. Are comfortable and fit you well. Are closed at the toe. Do not wear sandals. If you use a stepladder: Make sure that it is fully opened. Do not climb a closed stepladder. Make sure that both sides of the stepladder are locked into place. Ask someone to hold it for you, if possible. Clearly mark and make sure that you can see: Any grab bars or handrails. First and last steps. Where the edge of each step is. Use tools that help you move around (mobility aids) if they are needed. These include: Canes. Walkers. Scooters. Crutches. Turn on the lights when you go into a dark area. Replace any light bulbs as soon as they burn out. Set up your furniture so you have a clear path. Avoid moving your furniture around. If any of your floors are uneven, fix them. If there are any pets around you, be aware of where they are. Review your medicines with your doctor. Some medicines can make you feel dizzy. This can increase your chance of falling. Ask your doctor what other things that you can do to help prevent falls. This information is not intended to replace advice given to you by your health care provider. Make sure you discuss any questions you have with your health care provider. Document Released: 03/24/2009 Document Revised: 11/03/2015 Document Reviewed: 07/02/2014 Elsevier Interactive Patient Education  2017 [employer].

## 2021-05-11 NOTE — Progress Notes (Signed)
Subjective:   Brittany Kirk is a 75 y.o. female who presents for Medicare Annual (Subsequent) preventive examination. Virtual Visit via Telephone Note  I connected with  Brittany Kirk on 05/11/21 at  2:00 PM EST by telephone and verified that I am speaking with the correct person using two identifiers.  Location: Patient: Home Provider: [facility] Persons participating in the virtual visit: patient/Nurse Health Advisor   I discussed the limitations, risks, security and privacy concerns of performing an evaluation and management service by telephone and the availability of in person appointments. The patient expressed understanding and agreed to proceed.  Interactive audio and video telecommunications were attempted between this nurse and patient, however failed, due to patient having technical difficulties OR patient did not have access to video capability.  We continued and completed visit with audio only.  Some vital signs may be absent or patient reported.   Chriss Driver, LPN  Review of Systems     Cardiac Risk Factors include: advanced age (>9men, >58 women);hypertension;dyslipidemia;sedentary lifestyle     Objective:    Today's Vitals   05/11/21 1401 05/11/21 1404  Weight: 130 lb (59 kg)   Height: 4\' 11"  (1.499 m)   PainSc:  2    Body mass index is 26.26 kg/m.  Advanced Directives 05/11/2021 04/14/2021 03/21/2021 01/13/2021 07/11/2020 03/12/2020 05/29/2017  Does Patient Have a Medical Advance Directive? No No No No Yes No No  Would patient like information on creating a medical advance directive? No - Patient declined No - Patient declined No - Patient declined - - No - Patient declined Yes (MAU/Ambulatory/Procedural Areas - Information given)  Pre-existing out of facility DNR order (yellow form or pink MOST form) - - - - - - -    Current Medications (verified) Outpatient Encounter Medications as of 05/11/2021  Medication Sig   alendronate (FOSAMAX) 70 MG tablet  Take 1 tablet (70 mg total) by mouth every 7 (seven) days. Take with a full glass of water on an empty stomach. (Patient taking differently: Take 70 mg by mouth every Sunday. Take with a full glass of water on an empty stomach.)   ascorbic acid (VITAMIN C) 500 MG tablet Take 500 mg by mouth daily.   atorvastatin (LIPITOR) 40 MG tablet TAKE 1 TABLET BY MOUTH EVERYDAY AT BEDTIME   Calcium Carbonate (CALCIUM 500 PO) Take 500 mg by mouth in the morning and at bedtime.   cholecalciferol (VITAMIN D3) 25 MCG (1000 UNIT) tablet Take 1,000 Units by mouth daily.   Cyanocobalamin (B-12) 2500 MCG TABS Take 2,500 mcg by mouth daily.   diazepam (VALIUM) 5 MG tablet Take 5 mg by mouth daily as needed for anxiety.   diltiazem (CARDIZEM) 30 MG tablet Take 1 table as needed for palpitations. Can take 2nd table after 3 hours if persistent palpitations.   ELIQUIS 5 MG TABS tablet TAKE 1 TABLET BY MOUTH TWICE A DAY   melatonin 5 MG TABS Take 5 mg by mouth at bedtime as needed (sleep).   metoprolol succinate (TOPROL-XL) 25 MG 24 hr tablet Take 12.5 mg by mouth daily.   omeprazole (PRILOSEC) 20 MG capsule Take 20 mg by mouth every evening.   spironolactone (ALDACTONE) 25 MG tablet Take 0.5 tablets (12.5 mg total) by mouth daily.   traZODone (DESYREL) 50 MG tablet Take 0.5-1 tablets (25-50 mg total) by mouth at bedtime as needed. for sleep   zinc gluconate 50 MG tablet Take 50 mg by mouth daily.  No facility-administered encounter medications on file as of 05/11/2021.    Allergies (verified) Penicillins, Levofloxacin, Cefaclor, Latex, Metronidazole, Naproxen, Nsaids, Septra [bactrim], Sulfonamide derivatives, and Tape   History: Past Medical History:  Diagnosis Date   Acid reflux    Allergic rhinitis    Anxiety    Asymptomatic bilateral carotid artery stenosis    40-59% (02/2018)   Atrial fibrillation ([place]) 07/2019   Breast cancer ([place]) 2015   Right Breast Cancer   CAD (coronary artery disease)     Depression    Emphysema    no longer uses inhalers   Gallstones    nausea, pain upper right abdomen   GERD (gastroesophageal reflux disease)    H/O hiatal hernia    Hiatal hernia    History of skin cancer    Hyperlipidemia    Hypertension    Multinodular goiter (nontoxic)    Scoliosis    Skin cancer    Tobacco user    Wears dentures    top   Past Surgical History:  Procedure Laterality Date   ATRIAL FIBRILLATION ABLATION N/A 04/14/2021   Procedure: ATRIAL FIBRILLATION ABLATION;  Surgeon: Constance Haw, MD;  Location: [facility];  Service: Cardiovascular;  Laterality: N/A;   BREAST LUMPECTOMY Right 2015   CHOLECYSTECTOMY  07/08/2012   Procedure: LAPAROSCOPIC CHOLECYSTECTOMY WITH INTRAOPERATIVE CHOLANGIOGRAM;  Surgeon: Gayland Curry, MD,FACS;  Location: [facility];  Service: General;  Laterality: N/A;  Laparoscopic Cholecystectomy with Intraoperative Cholangiogram   COLONOSCOPY     ESOPHAGOSCOPY N/A 05/29/2017   Procedure: ESOPHAGOSCOPY;  Surgeon: Clarene Essex, MD;  Location: [facility];  Service: Endoscopy;  Laterality: N/A;  botox injection   HAND SURGERY Left    wrist   LEFT HEART CATH AND CORONARY ANGIOGRAPHY N/A 10/11/2017   Procedure: LEFT HEART CATH AND CORONARY ANGIOGRAPHY;  Surgeon: Martinique, Peter M, MD;  Location: [place] [facility];  Service: Cardiovascular;  Laterality: N/A;   TONSILECTOMY, ADENOIDECTOMY, BILATERAL MYRINGOTOMY AND TUBES     TUBAL LIGATION     Family History  Problem Relation Age of Onset   Arthritis Mother    Pancreatic cancer Mother    Heart disease Other        5 brothers and 2 sister   Diverticulitis Sister    Heart disease Sister    Aplastic anemia Other    Social History   Socioeconomic History   Marital status: Married    Spouse name: Psychologist, prison and probation services   Number of children: 1   Years of education: Not on file   Highest education level: Not on file  Occupational History   Occupation: retired truck Orthoptist: RETIRED   Tobacco Use   Smoking status: Former    Packs/day: 0.50    Years: 46.00    Pack years: 23.00    Types: Cigarettes    Quit date: 02/13/2011    Years since quitting: 10.2   Smokeless tobacco: Never  Vaping Use   Vaping Use: Never used  Substance and Sexual Activity   Alcohol use: No   Drug use: No   Sexual activity: Not on file  Other Topics Concern   Not on file  Social History Narrative   Married since 1991, second marriage.    1 son   1 grandson   1 granddaughter   2 step children   3 step grandchildren   Social Determinants of Health   Financial Resource Strain: Low Risk    Difficulty of Paying  Living Expenses: Not hard at all  Food Insecurity: No Food Insecurity   Worried About [place] in the Last Year: Never true   Ran Out of Food in the Last Year: Never true  Transportation Needs: No Transportation Needs   Lack of Transportation (Medical): No   Lack of Transportation (Non-Medical): No  Physical Activity: Insufficiently Active   Days of Exercise per Week: 3 days   Minutes of Exercise per Session: 20 min  Stress: Stress Concern Present   Feeling of Stress : To some extent  Social Connections: Engineer, building services of Communication with Friends and Family: More than three times a week   Frequency of Social Gatherings with Friends and Family: More than three times a week   Attends Religious Services: More than 4 times per year   Active Member of [employer] or Organizations: Yes   Attends Music therapist: More than 4 times per year   Marital Status: Married    Tobacco Counseling Counseling given: Not Answered   Clinical Intake:  Pre-visit preparation completed: Yes  Pain : 0-10 Pain Score: 2  Pain Location: Back Pain Descriptors / Indicators: Aching Pain Onset: More than a month ago Pain Frequency: Intermittent     BMI - recorded: 26.26 Nutritional Risks: None Diabetes: No  How often do you need to have someone help  you when you read instructions, pamphlets, or other written materials from your doctor or pharmacy?: 1 - Never  Diabetic?No  Interpreter Needed?: No  Information entered by :: MJ Pamela Maddy, LPN   Activities of Daily Living In your present state of health, do you have any difficulty performing the following activities: 05/11/2021  Hearing? N  Vision? N  Difficulty concentrating or making decisions? N  Walking or climbing stairs? N  Dressing or bathing? N  Doing errands, shopping? N  Preparing Food and eating ? N  Using the Toilet? N  In the past six months, have you accidently leaked urine? N  Do you have problems with loss of bowel control? N  Managing your Medications? N  Managing your Finances? N  Housekeeping or managing your Housekeeping? N  Some recent data might be hidden    Patient Care Team: Susy Frizzle, MD as PCP - General (Family Medicine) Lorretta Harp, MD as PCP - Cardiology (Cardiology) Constance Haw, MD as PCP - Electrophysiology (Cardiology) Magrinat, Virgie Dad, MD as Consulting Physician (Oncology) Thea Silversmith, MD as Consulting Physician (Radiation Oncology) Dian Queen, MD as Consulting Physician (Obstetrics and Gynecology) Clarene Essex, MD as Consulting Physician (Gastroenterology) Edythe Clarity, [facility] as Pharmacist (Pharmacist)  Indicate any recent Medical Services you may have received from other than [facility] providers in the past year (date may be approximate).     Assessment:   This is a routine wellness examination for [place].  Hearing/Vision screen Hearing Screening - Comments:: Some hearing issues. Has been referred to Dr. Melene Plan but they can't see pt until January.  Vision Screening - Comments:: Readers. Dr. Marica Otter. 2022.  Dietary issues and exercise activities discussed: Current Exercise Habits: Home exercise routine, Type of exercise: walking, Time (Minutes): 20, Frequency (Times/Week): 3, Weekly Exercise  (Minutes/Week): 60, Intensity: Mild, Exercise limited by: cardiac condition(s)   Goals Addressed             This Visit's Progress    Exercise 3x per week (30 min per time)       Try to exercise more and get  more rest.        Depression Screen PHQ 2/9 Scores 05/11/2021 08/26/2018 07/14/2018 01/06/2018 03/15/2017 07/05/2016 03/08/2016  PHQ - 2 Score 0 2 1 2  0 0 0  PHQ- 9 Score - 4 - 8 - - -    Fall Risk Fall Risk  05/11/2021 08/04/2020 07/14/2018 01/06/2018 03/15/2017  Falls in the past year? 0 1 0 No No  Number falls in past yr: 0 0 0 - -  Injury with Fall? 0 - 1 - -  Risk for fall due to : No Fall Risks - - - -  Risk for fall due to: Comment - - - - -  Follow up Falls prevention discussed - Falls evaluation completed - -    FALL RISK PREVENTION PERTAINING TO THE HOME:  Any stairs in or around the home? Yes  If so, are there any without handrails? No  Home free of loose throw rugs in walkways, pet beds, electrical cords, etc? Yes  Adequate lighting in your home to reduce risk of falls? Yes   ASSISTIVE DEVICES UTILIZED TO PREVENT FALLS:  Life alert? No  Use of a cane, walker or w/c? No  Grab bars in the bathroom? Yes  Shower chair or bench in shower? Yes  Elevated toilet seat or a handicapped toilet? Yes   TIMED UP AND GO:  Was the test performed? No  Phone visit.   Cognitive Function:     6CIT Screen 05/11/2021  What Year? 0 points  What month? 0 points  What time? 0 points  Count back from 20 0 points  Months in reverse 2 points  Repeat phrase 0 points  Total Score 2    Immunizations Immunization History  Administered Date(s) Administered   Fluad Quad(high Dose 65+) 03/10/2019   Influenza Split 05/04/2013   Influenza Whole 06/11/2006, 06/11/2008, 03/06/2011   Influenza, High Dose Seasonal PF 03/12/2017, 04/23/2018   Influenza,inj,Quad PF,6+ Mos 06/14/2014, 02/22/2015, 03/08/2016   PFIZER(Purple Top)SARS-COV-2 Vaccination 08/07/2019, 09/01/2019   Pneumococcal  Conjugate-13 02/22/2015   Pneumococcal Polysaccharide-23 07/14/2013   Tdap 05/19/2012    TDAP status: Up to date  Flu Vaccine status: Due, Education has been provided regarding the importance of this vaccine. Advised may receive this vaccine at local pharmacy or Health Dept. Aware to provide a copy of the vaccination record if obtained from local pharmacy or Health Dept. Verbalized acceptance and understanding.  Pneumococcal vaccine status: Up to date  Covid-19 vaccine status: Completed vaccines  Qualifies for Shingles Vaccine? Yes   Zostavax completed No   Shingrix Completed?: No.    Education has been provided regarding the importance of this vaccine. Patient has been advised to call insurance company to determine out of pocket expense if they have not yet received this vaccine. Advised may also receive vaccine at local pharmacy or Health Dept. Verbalized acceptance and understanding.  Screening Tests Health Maintenance  Topic Date Due   Zoster Vaccines- Shingrix (1 of 2) Never done   COVID-19 Vaccine (3 - Pfizer risk series) 09/29/2019   INFLUENZA VACCINE  01/09/2021   Hepatitis C Screening  06/09/2021 (Originally 12/14/1963)   TETANUS/TDAP  05/19/2022   Pneumonia Vaccine 7+ Years old  Completed   DEXA SCAN  Completed   HPV VACCINES  Aged Out   COLONOSCOPY (Pts 45-65yrs Insurance coverage will need to be confirmed)  Discontinued    Health Maintenance  Health Maintenance Due  Topic Date Due   Zoster Vaccines- Shingrix (1 of 2) Never done  COVID-19 Vaccine (3 - Pfizer risk series) 09/29/2019   INFLUENZA VACCINE  01/09/2021    Colorectal cancer screening: Type of screening: Colonoscopy. Completed 07/10/2017. Repeat every 10 years  Mammogram status: Completed 12/23/2020. Repeat every year  Bone Density status: Ordered 05/11/2021. Pt provided with contact info and advised to call to schedule appt.  Lung Cancer Screening: (Low Dose CT Chest recommended if Age 28-80 years, 30  pack-year currently smoking OR have quit w/in 15years.) does qualify.   Lung Cancer Screening Referral: Declined.  Additional Screening:  Hepatitis C Screening: does qualify; Completed DUE  Vision Screening: Recommended annual ophthalmology exams for early detection of glaucoma and other disorders of the eye. Is the patient up to date with their annual eye exam?  Yes  Who is the provider or what is the name of the office in which the patient attends annual eye exams? Dr. Marica Otter If pt is not established with a provider, would they like to be referred to a provider to establish care? No .   Dental Screening: Recommended annual dental exams for proper oral hygiene  Community Resource Referral / Chronic Care Management: CRR required this visit?  No   CCM required this visit?  No      Plan:     I have personally reviewed and noted the following in the patient's chart:   Medical and social history Use of alcohol, tobacco or illicit drugs  Current medications and supplements including opioid prescriptions.  Functional ability and status Nutritional status Physical activity Advanced directives List of other physicians Hospitalizations, surgeries, and ER visits in previous 12 months Vitals Screenings to include cognitive, depression, and falls Referrals and appointments  In addition, I have reviewed and discussed with patient certain preventive protocols, quality metrics, and best practice recommendations. A written personalized care plan for preventive services as well as general preventive health recommendations were provided to patient.     Chriss Driver, LPN   [id number]   Nurse Notes: Phone visit. Pt at home. 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 Pt up to date on health maintenance other than bone density. Order placed today and patient is agreeable. Pt did not get flu vaccine this year due to getting the flu virus before she was able to get the vaccine. Discussed Shingrix vaccine and  how to obtain.

## 2021-05-12 DIAGNOSIS — R001 Bradycardia, unspecified: Secondary | ICD-10-CM | POA: Diagnosis not present

## 2021-05-15 ENCOUNTER — Other Ambulatory Visit: Payer: Self-pay

## 2021-05-15 ENCOUNTER — Ambulatory Visit (HOSPITAL_COMMUNITY)
Admission: RE | Admit: 2021-05-15 | Discharge: 2021-05-15 | Disposition: A | Discharge status: Home / Self Care | Payer: Medicare Other | Source: Ambulatory Visit | Attending: Physician Assistant | Admitting: Physician Assistant

## 2021-05-15 VITALS — BP 126/44 | HR 58 | Ht 59.0 in | Wt 119.4 lb

## 2021-05-15 DIAGNOSIS — I480 Paroxysmal atrial fibrillation: Secondary | ICD-10-CM | POA: Insufficient documentation

## 2021-05-15 DIAGNOSIS — Z7901 Long term (current) use of anticoagulants: Secondary | ICD-10-CM | POA: Insufficient documentation

## 2021-05-15 DIAGNOSIS — I10 Essential (primary) hypertension: Secondary | ICD-10-CM | POA: Diagnosis not present

## 2021-05-15 DIAGNOSIS — Z79899 Other long term (current) drug therapy: Secondary | ICD-10-CM | POA: Insufficient documentation

## 2021-05-15 DIAGNOSIS — I779 Disorder of arteries and arterioles, unspecified: Secondary | ICD-10-CM | POA: Diagnosis not present

## 2021-05-15 DIAGNOSIS — I493 Ventricular premature depolarization: Secondary | ICD-10-CM | POA: Insufficient documentation

## 2021-05-15 DIAGNOSIS — I2510 Atherosclerotic heart disease of native coronary artery without angina pectoris: Secondary | ICD-10-CM | POA: Insufficient documentation

## 2021-05-15 DIAGNOSIS — R001 Bradycardia, unspecified: Secondary | ICD-10-CM | POA: Insufficient documentation

## 2021-05-15 DIAGNOSIS — E785 Hyperlipidemia, unspecified: Secondary | ICD-10-CM | POA: Insufficient documentation

## 2021-05-15 DIAGNOSIS — D6869 Other thrombophilia: Secondary | ICD-10-CM | POA: Diagnosis not present

## 2021-05-15 NOTE — Progress Notes (Signed)
Primary Care Physician: Susy Frizzle, MD Primary Cardiologist: Dr Gwenlyn Found Primary Electrophysiologist: Dr Curt Bears Referring Physician: Dr Hardie Shackleton is a 75 y.o. female with a history of CAD, HLD, HTN, bilateral carotid artery disease, atrial fibrillation who presents for follow up in the [facility]. She presented to the hospital August 2022 with atrial fibrillation.  She awoke from sleep with chest pain and palpitations.  She spontaneously converted to sinus rhythm. She continued to have issues with afib and underwent afib ablation with Dr Curt Bears on 04/14/21. Patient is on Eliquis for a CHADS2VASC score of 5. She has noted an irregular hear rhythm for several weeks. She saw Dr Gwenlyn Found on 04/25/21 and a heart monitor was ordered which showed no afib but did show 17% PVC burden. She does have symptoms of fatigue. She has an echo pending 05/17/21.  Today, she denies symptoms of chest pain, shortness of breath, orthopnea, PND, lower extremity edema, dizziness, presyncope, syncope, snoring, daytime somnolence, bleeding, or neurologic sequela. The patient is tolerating medications without difficulties and is otherwise without complaint today.    Atrial Fibrillation Risk Factors:  she does not have symptoms or diagnosis of sleep apnea. she does not have a history of rheumatic fever.   she has a BMI of Body mass index is 24.12 kg/m.Marland Kitchen Filed Weights   05/15/21 1321  Weight: 54.2 kg    Family History  Problem Relation Age of Onset   Arthritis Mother    Pancreatic cancer Mother    Heart disease Other        5 brothers and 2 sister   Diverticulitis Sister    Heart disease Sister    Aplastic anemia Other      Atrial Fibrillation Management history:  Previous antiarrhythmic drugs: none Previous cardioversions: none Previous ablations: 04/14/21 CHADS2VASC score: 5 Anticoagulation history: Eliquis   Past Medical History:  Diagnosis Date    Acid reflux    Allergic rhinitis    Anxiety    Asymptomatic bilateral carotid artery stenosis    40-59% (02/2018)   Atrial fibrillation ([place]) 07/2019   Breast cancer ([place]) 2015   Right Breast Cancer   CAD (coronary artery disease)    Depression    Emphysema    no longer uses inhalers   Gallstones    nausea, pain upper right abdomen   GERD (gastroesophageal reflux disease)    H/O hiatal hernia    Hiatal hernia    History of skin cancer    Hyperlipidemia    Hypertension    Multinodular goiter (nontoxic)    Scoliosis    Skin cancer    Tobacco user    Wears dentures    top   Past Surgical History:  Procedure Laterality Date   ATRIAL FIBRILLATION ABLATION N/A 04/14/2021   Procedure: ATRIAL FIBRILLATION ABLATION;  Surgeon: Constance Haw, MD;  Location: [facility];  Service: Cardiovascular;  Laterality: N/A;   BREAST LUMPECTOMY Right 2015   CHOLECYSTECTOMY  07/08/2012   Procedure: LAPAROSCOPIC CHOLECYSTECTOMY WITH INTRAOPERATIVE CHOLANGIOGRAM;  Surgeon: Gayland Curry, MD,FACS;  Location: [facility];  Service: General;  Laterality: N/A;  Laparoscopic Cholecystectomy with Intraoperative Cholangiogram   COLONOSCOPY     ESOPHAGOSCOPY N/A 05/29/2017   Procedure: ESOPHAGOSCOPY;  Surgeon: Clarene Essex, MD;  Location: [facility];  Service: Endoscopy;  Laterality: N/A;  botox injection   HAND SURGERY Left    wrist   LEFT HEART CATH AND CORONARY ANGIOGRAPHY N/A  10/11/2017   Procedure: LEFT HEART CATH AND CORONARY ANGIOGRAPHY;  Surgeon: Martinique, Peter M, MD;  Location: [facility];  Service: Cardiovascular;  Laterality: N/A;   TONSILECTOMY, ADENOIDECTOMY, BILATERAL MYRINGOTOMY AND TUBES     TUBAL LIGATION      Current Outpatient Medications  Medication Sig Dispense Refill   alendronate (FOSAMAX) 70 MG tablet Take 1 tablet (70 mg total) by mouth every 7 (seven) days. Take with a full glass of water on an empty stomach. (Patient taking differently: Take 70 mg by mouth every  Sunday. Take with a full glass of water on an empty stomach.) 4 tablet 11   ascorbic acid (VITAMIN C) 500 MG tablet Take 500 mg by mouth daily.     atorvastatin (LIPITOR) 40 MG tablet TAKE 1 TABLET BY MOUTH EVERYDAY AT BEDTIME 90 tablet 2   Calcium Carbonate (CALCIUM 500 PO) Take 500 mg by mouth in the morning and at bedtime.     cholecalciferol (VITAMIN D3) 25 MCG (1000 UNIT) tablet Take 1,000 Units by mouth daily.     Cyanocobalamin (B-12) 2500 MCG TABS Take 2,500 mcg by mouth daily.     diazepam (VALIUM) 5 MG tablet Take 5 mg by mouth daily as needed for anxiety.     diltiazem (CARDIZEM) 30 MG tablet Take 1 table as needed for palpitations. Can take 2nd table after 3 hours if persistent palpitations. 30 tablet 1   ELIQUIS 5 MG TABS tablet TAKE 1 TABLET BY MOUTH TWICE A DAY 180 tablet 1   MAGNESIUM OXIDE PO Take 250 mg by mouth every morning.     melatonin 5 MG TABS Take 5 mg by mouth at bedtime as needed (sleep).     metoprolol succinate (TOPROL-XL) 25 MG 24 hr tablet Take 12.5 mg by mouth daily.     omeprazole (PRILOSEC) 20 MG capsule Take 20 mg by mouth every evening.     spironolactone (ALDACTONE) 25 MG tablet Take 0.5 tablets (12.5 mg total) by mouth daily. 45 tablet 3   traZODone (DESYREL) 50 MG tablet Take 0.5-1 tablets (25-50 mg total) by mouth at bedtime as needed. for sleep 90 tablet 3   zinc gluconate 50 MG tablet Take 50 mg by mouth daily.     No current facility-administered medications for this encounter.    Allergies  Allergen Reactions   Penicillins Anaphylaxis, Itching, Swelling and Rash    Has patient had a PCN reaction causing immediate rash, facial/tongue/throat swelling, SOB or lightheadedness with hypotension: Yes Has patient had a PCN reaction causing severe rash involving mucus membranes or skin necrosis: No Has patient had a PCN reaction that required hospitalization: Yes Has patient had a PCN reaction occurring within the last 10 years: No If all of the above  answers are "NO", then may proceed with Cephalosporin use.    Levofloxacin Nausea Only and Other (See Comments)    Causes headache, fatigue and dizziness   Cefaclor Itching, Swelling and Rash   Latex Itching and Rash   Metronidazole Itching, Swelling and Rash   Naproxen Itching, Swelling and Rash   Nsaids Itching, Swelling and Rash    Ibuprofen IS tolerated   Septra [Bactrim] Itching, Swelling and Rash   Sulfonamide Derivatives Itching, Swelling and Rash   Tape Itching and Rash    ONLY USE PAPER TAPE    Social History   Socioeconomic History   Marital status: Married    Spouse name: Harrie Jeans   Number of children: 1   Years  of education: Not on file   Highest education level: Not on file  Occupational History   Occupation: retired truck Orthoptist: RETIRED  Tobacco Use   Smoking status: Former    Packs/day: 0.50    Years: 46.00    Pack years: 23.00    Types: Cigarettes    Quit date: 02/13/2011    Years since quitting: 10.2   Smokeless tobacco: Never  Vaping Use   Vaping Use: Never used  Substance and Sexual Activity   Alcohol use: No   Drug use: No   Sexual activity: Not on file  Other Topics Concern   Not on file  Social History Narrative   Married since 1991, second marriage.    1 son   1 grandson   1 granddaughter   2 step children   3 step grandchildren   Social Determinants of Radio broadcast assistant Strain: Low Risk    Difficulty of Paying Living Expenses: Not hard at all  Food Insecurity: No Food Insecurity   Worried About Charity fundraiser in the Last Year: Never true   Arboriculturist in the Last Year: Never true  Transportation Needs: No Transportation Needs   Lack of Transportation (Medical): No   Lack of Transportation (Non-Medical): No  Physical Activity: Insufficiently Active   Days of Exercise per Week: 3 days   Minutes of Exercise per Session: 20 min  Stress: Stress Concern Present   Feeling of Stress : To some extent   Social Connections: Engineer, building services of Communication with Friends and Family: More than three times a week   Frequency of Social Gatherings with Friends and Family: More than three times a week   Attends Religious Services: More than 4 times per year   Active Member of [employer] or Organizations: Yes   Attends Music therapist: More than 4 times per year   Marital Status: Married  Human resources officer Violence: Not At Risk   Fear of Current or Ex-Partner: No   Emotionally Abused: No   Physically Abused: No   Sexually Abused: No     ROS- All systems are reviewed and negative except as per the HPI above.  Physical Exam: Vitals:   05/15/21 1321  BP: (!) 126/44  Pulse: (!) 58  Weight: 54.2 kg  Height: 4\' 11"  (1.499 m)    GEN- The patient is a well appearing elderly female, alert and oriented x 3 today.   Head- normocephalic, atraumatic Eyes-  Sclera clear, conjunctiva pink Ears- hearing intact Oropharynx- clear Neck- supple  Lungs- Clear to ausculation bilaterally, normal work of breathing Heart- Regular rate and rhythm, occasional ectopic beats, no murmurs, rubs or gallops  GI- soft, NT, ND, + BS Extremities- no clubbing, cyanosis, or edema MS- no significant deformity or atrophy Skin- no rash or lesion Psych- euthymic mood, full affect Neuro- strength and sensation are intact  Wt Readings from Last 3 Encounters:  05/15/21 54.2 kg  05/11/21 59 kg  04/25/21 59.1 kg    EKG today demonstrates  SR with PVCs in pattern of trigeminy Vent. rate 58 BPM PR interval 152 ms QRS duration 84 ms QT/QTcB 432/424 ms  Echo 01/13/21 demonstrated  1. Left ventricular ejection fraction, by estimation, is 60 to 65%. The  left ventricle has normal function. The left ventricle has no regional  wall motion abnormalities. Left ventricular diastolic parameters were normal.   2. Right ventricular systolic function is  normal. The right ventricular  size is normal.    3. The mitral valve is normal in structure. No evidence of mitral valve regurgitation.   4. The aortic valve is normal in structure. Aortic valve regurgitation is not visualized. No aortic stenosis is present.   Epic records are reviewed at length today  CHA2DS2-VASc Score = 5  The patient's score is based upon: CHF History: 0 HTN History: 1 Diabetes History: 0 Stroke History: 0 Vascular Disease History: 1 Age Score: 2 Gender Score: 1       ASSESSMENT AND PLAN: 1. Paroxysmal Atrial Fibrillation (ICD10:  I48.0) The patient's CHA2DS2-VASc score is 5, indicating a 7.2% annual risk of stroke.   S/p afib ablation 04/14/21 Continue Eliquis 5 mg BID with no missed doses for 3 months post ablation.  Continue Toprol 12.5 mg daily Continue diltiazem 30 mg q 4 hours PRN for heart racing.   2. Secondary Hypercoagulable State (ICD10:  D68.69) The patient is at significant risk for stroke/thromboembolism based upon her CHA2DS2-VASc Score of 5.  Continue Apixaban (Eliquis).   3. CAD No anginal symptoms.  4. HTN Stable, no changes today.  5. PVC 17% burden Would not increase BB at this time given bradycardia. Not a candidate for flecainide with h/o CAD. Will see what echo shows, if EF preserved will continue present therapy. If evidence of PVC cardiomyopathy, will request sooner visit with Dr Curt Bears.    Follow up with Dr Curt Bears as scheduled.    [facility] [address] [place], [place] [address] [phone] 05/15/2021 1:50 PM

## 2021-05-17 ENCOUNTER — Other Ambulatory Visit: Payer: Self-pay

## 2021-05-17 ENCOUNTER — Ambulatory Visit (HOSPITAL_COMMUNITY): Payer: Medicare Other | Attending: Cardiology

## 2021-05-17 DIAGNOSIS — I6523 Occlusion and stenosis of bilateral carotid arteries: Secondary | ICD-10-CM | POA: Insufficient documentation

## 2021-05-17 DIAGNOSIS — I2510 Atherosclerotic heart disease of native coronary artery without angina pectoris: Secondary | ICD-10-CM | POA: Insufficient documentation

## 2021-05-17 DIAGNOSIS — I4891 Unspecified atrial fibrillation: Secondary | ICD-10-CM | POA: Insufficient documentation

## 2021-05-17 DIAGNOSIS — I10 Essential (primary) hypertension: Secondary | ICD-10-CM | POA: Diagnosis not present

## 2021-05-17 DIAGNOSIS — R001 Bradycardia, unspecified: Secondary | ICD-10-CM | POA: Insufficient documentation

## 2021-05-17 DIAGNOSIS — E782 Mixed hyperlipidemia: Secondary | ICD-10-CM | POA: Diagnosis not present

## 2021-05-17 LAB — ECHOCARDIOGRAM COMPLETE
Area-P 1/2: 3.61 cm2
S' Lateral: 2.7 cm

## 2021-05-21 ENCOUNTER — Encounter: Payer: Self-pay | Admitting: Cardiovascular Disease

## 2021-05-23 ENCOUNTER — Ambulatory Visit: Payer: Medicare Other | Admitting: Family Medicine

## 2021-06-06 DIAGNOSIS — L723 Sebaceous cyst: Secondary | ICD-10-CM | POA: Diagnosis not present

## 2021-06-07 ENCOUNTER — Other Ambulatory Visit: Payer: Self-pay | Admitting: Cardiovascular Disease

## 2021-06-08 ENCOUNTER — Encounter: Payer: Self-pay | Admitting: Nurse Practitioner

## 2021-06-08 ENCOUNTER — Other Ambulatory Visit: Payer: Self-pay

## 2021-06-08 ENCOUNTER — Telehealth (INDEPENDENT_AMBULATORY_CARE_PROVIDER_SITE_OTHER): Payer: Medicare Other | Admitting: Nurse Practitioner

## 2021-06-08 DIAGNOSIS — J069 Acute upper respiratory infection, unspecified: Secondary | ICD-10-CM

## 2021-06-08 NOTE — Progress Notes (Signed)
Subjective:    Patient ID: Brittany Kirk, female    DOB: [birth year], 75 y.o.   MRN: [id number]  HPI: Brittany Kirk is a 75 y.o. female presenting virtually for cough and fever.   Chief Complaint  Patient presents with   Cough   UPPER RESPIRATORY TRACT INFECTION Onset: Tuesday night  Fever: low grade Body aches: yes; improved Chills: yes; improved Cough: yes; productive Shortness of breath: no Wheezing: no Chest pain: no Chest tightness: no Chest congestion: yes Nasal congestion: no Runny nose: yes Post nasal drip: yes; better Sneezing: yes; better Sore throat:  yes; better Swollen glands: no Sinus pressure: no Headache: no Face pain: no Toothache: no Ear pain: no  Ear pressure: no  Eyes red/itching:no Eye drainage/crusting: no  Nausea: no  Vomiting: no Diarrhea: no  Change in appetite:  yes; decreased   Loss of taste/smell: yes; taste is altered Rash: no Fatigue: yes Sick contacts: no Strep contacts: no  Context: better Recurrent sinusitis: no Treatments attempted: coricidin HBP  Relief with OTC medications: yes  Allergies  Allergen Reactions   Penicillins Anaphylaxis, Itching, Swelling and Rash    Has patient had a PCN reaction causing immediate rash, facial/tongue/throat swelling, SOB or lightheadedness with hypotension: Yes Has patient had a PCN reaction causing severe rash involving mucus membranes or skin necrosis: No Has patient had a PCN reaction that required hospitalization: Yes Has patient had a PCN reaction occurring within the last 10 years: No If all of the above answers are "NO", then may proceed with Cephalosporin use.    Levofloxacin Nausea Only and Other (See Comments)    Causes headache, fatigue and dizziness   Cefaclor Itching, Swelling and Rash   Latex Itching and Rash   Metronidazole Itching, Swelling and Rash   Naproxen Itching, Swelling and Rash   Nsaids Itching, Swelling and Rash    Ibuprofen IS tolerated   Septra  [Bactrim] Itching, Swelling and Rash   Sulfonamide Derivatives Itching, Swelling and Rash   Tape Itching and Rash    ONLY USE PAPER TAPE    Outpatient Encounter Medications as of 06/08/2021  Medication Sig   alendronate (FOSAMAX) 70 MG tablet Take 1 tablet (70 mg total) by mouth every 7 (seven) days. Take with a full glass of water on an empty stomach. (Patient taking differently: Take 70 mg by mouth every Sunday. Take with a full glass of water on an empty stomach.)   ascorbic acid (VITAMIN C) 500 MG tablet Take 500 mg by mouth daily.   atorvastatin (LIPITOR) 40 MG tablet TAKE 1 TABLET BY MOUTH EVERYDAY AT BEDTIME   Calcium Carbonate (CALCIUM 500 PO) Take 500 mg by mouth in the morning and at bedtime.   cholecalciferol (VITAMIN D3) 25 MCG (1000 UNIT) tablet Take 1,000 Units by mouth daily.   Cyanocobalamin (B-12) 2500 MCG TABS Take 2,500 mcg by mouth daily.   diazepam (VALIUM) 5 MG tablet Take 5 mg by mouth daily as needed for anxiety.   diltiazem (CARDIZEM) 30 MG tablet Take 1 table as needed for palpitations. Can take 2nd table after 3 hours if persistent palpitations.   ELIQUIS 5 MG TABS tablet TAKE 1 TABLET BY MOUTH TWICE A DAY   MAGNESIUM OXIDE PO Take 250 mg by mouth every morning.   melatonin 5 MG TABS Take 5 mg by mouth at bedtime as needed (sleep).   metoprolol succinate (TOPROL-XL) 25 MG 24 hr tablet TAKE 1/2 TABLET BY MOUTH EVERY DAY  omeprazole (PRILOSEC) 20 MG capsule Take 20 mg by mouth every evening.   spironolactone (ALDACTONE) 25 MG tablet Take 0.5 tablets (12.5 mg total) by mouth daily.   traZODone (DESYREL) 50 MG tablet Take 0.5-1 tablets (25-50 mg total) by mouth at bedtime as needed. for sleep   zinc gluconate 50 MG tablet Take 50 mg by mouth daily.   No facility-administered encounter medications on file as of 06/08/2021.    Patient Active Problem List   Diagnosis Date Noted   Paroxysmal atrial fibrillation ([place]) 05/15/2021   Secondary hypercoagulable state  ([place]) 05/15/2021   Chest pain 01/13/2021   Atrial fibrillation, rapid ([place]) 07/31/2020   Upper respiratory tract infection 06/09/2020   Atrial fibrillation with RVR ([place]) 07/15/2019   Multinodular goiter (nontoxic)    Asymptomatic bilateral carotid artery stenosis    History of depression 02/14/2018   History of breast cancer 02/14/2018   Accelerated hypertension 02/14/2018   Unstable angina ([place]) 10/11/2017   Caloric malnutrition ([place]) 08/16/2017   Weight loss, unintentional 03/12/2017   GERD (gastroesophageal reflux disease) 08/10/2016   Cough 08/10/2016   Acute bronchitis 07/12/2016   Bilateral carotid artery disease ([place]) 02/14/2016   Chest pain with moderate risk for cardiac etiology 01/13/2016   Osteoporosis 02/22/2015   Stage 2 chronic kidney disease due to benign hypertension 08/16/2014   Malignant neoplasm of upper-outer quadrant of right breast in female, estrogen receptor positive ([place]) 03/22/2014   Dizziness and giddiness 11/27/2012   Essential tremor 11/27/2012   Numbness 11/27/2012   CLOSED FRACTURE OF ONE RIB 07/12/2009   Hyperlipidemia 09/28/2008   CAD in native artery 09/28/2008   Rhinitis 09/09/2008   COPD (chronic obstructive pulmonary disease) ([place]) 09/07/2008   Essential hypertension 09/06/2008   SKIN CANCER, HX OF 09/06/2008    Past Medical History:  Diagnosis Date   Acid reflux    Allergic rhinitis    Anxiety    Asymptomatic bilateral carotid artery stenosis    40-59% (02/2018)   Atrial fibrillation ([place]) 07/2019   Breast cancer ([place]) 2015   Right Breast Cancer   CAD (coronary artery disease)    Depression    Emphysema    no longer uses inhalers   Gallstones    nausea, pain upper right abdomen   GERD (gastroesophageal reflux disease)    H/O hiatal hernia    Hiatal hernia    History of skin cancer    Hyperlipidemia    Hypertension    Multinodular goiter (nontoxic)    Scoliosis    Skin cancer    Tobacco user    Wears dentures    top     Relevant past medical, surgical, family and social history reviewed and updated as indicated. Interim medical history since our last visit reviewed.  Review of Systems Per HPI unless specifically indicated above     Objective:    There were no vitals taken for this visit.  Wt Readings from Last 3 Encounters:  05/15/21 119 lb 6.4 oz (54.2 kg)  05/11/21 130 lb (59 kg)  04/25/21 130 lb 6.4 oz (59.1 kg)    Physical Exam Physical examination unable to be performed due to lack of equipment.  Patient talking in complete sentences during telemedicine visit.    Assessment & Plan:  1. Viral upper respiratory tract infection Acute x 3 days.  Encouraged viral testing.  Reassured patient that symptoms are most consistent with a viral upper respiratory infection and explained lack of efficacy of antibiotics against viruses.  Discussed  expected course and features suggestive of secondary bacterial infection.  Continue supportive care. Increase fluid intake with water or electrolyte solution like pedialyte. Encouraged acetaminophen as needed for fever/pain. Encouraged salt water gargling, chloraseptic spray and throat lozenges. Encouraged OTC guaifenesin. Encouraged saline sinus flushes and/or neti with humidified air.  Follow up if symptoms persist without improvement after 7 days.  If symptoms worsen, return to clinic.  With any sudden onset new chest pain, dizziness, sweating, or shortness of breath, go to ED.    Follow up plan: Return if symptoms worsen or fail to improve.   This visit was completed via telephone due to the restrictions of the COVID-19 pandemic. All issues as above were discussed and addressed but no physical exam was performed. If it was felt that the patient should be evaluated in the office, they were directed there. The patient verbally consented to this visit. Patient was unable to complete an audio/visual visit due to Technical difficulties.  Patient was able to connect to  video visit, however her cell phone would now allow access to microphone.  Although I could see the patient, I could not hear her.  The visit was then transitioned to a phone visit. Location of the patient: home Location of the provider: work Those involved with this call:  Provider: [name], DNP, FNP-C CMA: n/a Front Desk/Registration: Vevelyn Pat  Time spent on call:  8 minutes on the phone discussing health concerns. 15 minutes total spent in review of patient's record and preparation of their chart. I verified patient identity using two factors (patient name and date of birth). Patient consents verbally to being seen via telemedicine visit today.

## 2021-06-14 ENCOUNTER — Telehealth: Payer: Self-pay | Admitting: Cardiovascular Disease

## 2021-06-14 NOTE — Telephone Encounter (Signed)
Main OTC options that can help with nausea would be Peptobismol, Tums, ginger chews, or meclizine. She is ok to try any of these.

## 2021-06-14 NOTE — Telephone Encounter (Signed)
Pt informed of providers recommendations. Pt verbalized understanding. No further questions .  

## 2021-06-14 NOTE — Telephone Encounter (Signed)
Pt was diagnosed with Covid on Sunday, she wants to know what meds she can take that wont interfere with her heart meds.

## 2021-06-14 NOTE — Telephone Encounter (Signed)
Spoke with patient who did a COVID home test on Sunday and tested positive. She reports having nausea and wants to know what OTC she can take that won't interfere with her ordered medications. Please advise.

## 2021-06-16 ENCOUNTER — Observation Stay (HOSPITAL_COMMUNITY)
Admission: EM | Admit: 2021-06-16 | Discharge: 2021-06-17 | Disposition: A | Discharge status: Home / Self Care | Payer: Medicare Other | Attending: Internal Medicine | Admitting: Internal Medicine

## 2021-06-16 DIAGNOSIS — R079 Chest pain, unspecified: Secondary | ICD-10-CM

## 2021-06-16 DIAGNOSIS — J449 Chronic obstructive pulmonary disease, unspecified: Secondary | ICD-10-CM | POA: Insufficient documentation

## 2021-06-16 DIAGNOSIS — R002 Palpitations: Secondary | ICD-10-CM | POA: Diagnosis present

## 2021-06-16 DIAGNOSIS — I480 Paroxysmal atrial fibrillation: Principal | ICD-10-CM | POA: Insufficient documentation

## 2021-06-16 DIAGNOSIS — N189 Chronic kidney disease, unspecified: Secondary | ICD-10-CM | POA: Diagnosis not present

## 2021-06-16 DIAGNOSIS — I129 Hypertensive chronic kidney disease with stage 1 through stage 4 chronic kidney disease, or unspecified chronic kidney disease: Secondary | ICD-10-CM | POA: Diagnosis not present

## 2021-06-16 DIAGNOSIS — U071 COVID-19: Secondary | ICD-10-CM | POA: Diagnosis not present

## 2021-06-16 DIAGNOSIS — Z79899 Other long term (current) drug therapy: Secondary | ICD-10-CM | POA: Diagnosis not present

## 2021-06-16 DIAGNOSIS — Z7901 Long term (current) use of anticoagulants: Secondary | ICD-10-CM | POA: Diagnosis not present

## 2021-06-16 DIAGNOSIS — I4891 Unspecified atrial fibrillation: Secondary | ICD-10-CM | POA: Diagnosis present

## 2021-06-16 DIAGNOSIS — Z853 Personal history of malignant neoplasm of breast: Secondary | ICD-10-CM | POA: Diagnosis not present

## 2021-06-16 DIAGNOSIS — Z87891 Personal history of nicotine dependence: Secondary | ICD-10-CM | POA: Diagnosis not present

## 2021-06-16 DIAGNOSIS — I2510 Atherosclerotic heart disease of native coronary artery without angina pectoris: Secondary | ICD-10-CM | POA: Diagnosis not present

## 2021-06-16 DIAGNOSIS — Z743 Need for continuous supervision: Secondary | ICD-10-CM | POA: Diagnosis not present

## 2021-06-16 DIAGNOSIS — I517 Cardiomegaly: Secondary | ICD-10-CM | POA: Diagnosis not present

## 2021-06-16 DIAGNOSIS — I4430 Unspecified atrioventricular block: Secondary | ICD-10-CM | POA: Diagnosis not present

## 2021-06-16 DIAGNOSIS — Z85828 Personal history of other malignant neoplasm of skin: Secondary | ICD-10-CM | POA: Diagnosis not present

## 2021-06-16 DIAGNOSIS — R0789 Other chest pain: Secondary | ICD-10-CM | POA: Diagnosis not present

## 2021-06-16 DIAGNOSIS — R0602 Shortness of breath: Secondary | ICD-10-CM | POA: Diagnosis not present

## 2021-06-16 DIAGNOSIS — I499 Cardiac arrhythmia, unspecified: Secondary | ICD-10-CM | POA: Diagnosis not present

## 2021-06-16 DIAGNOSIS — R000 Tachycardia, unspecified: Secondary | ICD-10-CM | POA: Diagnosis not present

## 2021-06-16 DIAGNOSIS — Z91040 Latex allergy status: Secondary | ICD-10-CM | POA: Diagnosis not present

## 2021-06-16 DIAGNOSIS — I10 Essential (primary) hypertension: Secondary | ICD-10-CM | POA: Diagnosis present

## 2021-06-16 NOTE — ED Triage Notes (Addendum)
BIB [facility], pt reports palpitations tonight 1 hr prior to EMS arrival, felt slight dizziness so she took 5mg  of her diltiazem and 4 baby aspirins afterwards. Hx of A. Fib, on 5mg  BIB of Eliquis. 20G IV in LAC. COVID +(tested Sunday)

## 2021-06-17 ENCOUNTER — Emergency Department (HOSPITAL_COMMUNITY): Payer: Medicare Other

## 2021-06-17 ENCOUNTER — Other Ambulatory Visit: Payer: Self-pay

## 2021-06-17 DIAGNOSIS — I4891 Unspecified atrial fibrillation: Secondary | ICD-10-CM

## 2021-06-17 DIAGNOSIS — I480 Paroxysmal atrial fibrillation: Secondary | ICD-10-CM

## 2021-06-17 DIAGNOSIS — I2510 Atherosclerotic heart disease of native coronary artery without angina pectoris: Secondary | ICD-10-CM | POA: Diagnosis not present

## 2021-06-17 DIAGNOSIS — U071 COVID-19: Secondary | ICD-10-CM | POA: Diagnosis not present

## 2021-06-17 DIAGNOSIS — I10 Essential (primary) hypertension: Secondary | ICD-10-CM

## 2021-06-17 DIAGNOSIS — R0602 Shortness of breath: Secondary | ICD-10-CM | POA: Diagnosis not present

## 2021-06-17 DIAGNOSIS — I517 Cardiomegaly: Secondary | ICD-10-CM | POA: Diagnosis not present

## 2021-06-17 LAB — CBC WITH DIFFERENTIAL/PLATELET
Abs Immature Granulocytes: 0.03 10*3/uL (ref 0.00–0.07)
Basophils Absolute: 0 10*3/uL (ref 0.0–0.1)
Basophils Relative: 0 %
Eosinophils Absolute: 0.1 10*3/uL (ref 0.0–0.5)
Eosinophils Relative: 1 %
HCT: 37 % (ref 36.0–46.0)
Hemoglobin: 12.3 g/dL (ref 12.0–15.0)
Immature Granulocytes: 0 %
Lymphocytes Relative: 37 %
Lymphs Abs: 2.6 10*3/uL (ref 0.7–4.0)
MCH: 33.2 pg (ref 26.0–34.0)
MCHC: 33.2 g/dL (ref 30.0–36.0)
MCV: 99.7 fL (ref 80.0–100.0)
Monocytes Absolute: 0.5 10*3/uL (ref 0.1–1.0)
Monocytes Relative: 7 %
Neutro Abs: 3.8 10*3/uL (ref 1.7–7.7)
Neutrophils Relative %: 55 %
Platelets: 245 10*3/uL (ref 150–400)
RBC: 3.71 MIL/uL — ABNORMAL LOW (ref 3.87–5.11)
RDW: 13.2 % (ref 11.5–15.5)
WBC: 7 10*3/uL (ref 4.0–10.5)
nRBC: 0 % (ref 0.0–0.2)

## 2021-06-17 LAB — BASIC METABOLIC PANEL
Anion gap: 8 (ref 5–15)
BUN: 19 mg/dL (ref 8–23)
CO2: 23 mmol/L (ref 22–32)
Calcium: 8.5 mg/dL — ABNORMAL LOW (ref 8.9–10.3)
Chloride: 105 mmol/L (ref 98–111)
Creatinine, Ser: 0.91 mg/dL (ref 0.44–1.00)
GFR, Estimated: >60 mL/min (ref >60)
Glucose, Bld: 106 mg/dL — ABNORMAL HIGH (ref 70–99)
Potassium: 3.5 mmol/L (ref 3.5–5.1)
Sodium: 136 mmol/L (ref 135–145)

## 2021-06-17 LAB — RESP PANEL BY RT-PCR (FLU A&B, COVID) ARPGX2
Influenza A by PCR: NEGATIVE
Influenza B by PCR: NEGATIVE
SARS Coronavirus 2 by RT PCR: POSITIVE — AB

## 2021-06-17 LAB — TROPONIN I (HIGH SENSITIVITY)
Troponin I (High Sensitivity): 10 ng/L (ref <18)
Troponin I (High Sensitivity): 10 ng/L (ref <18)

## 2021-06-17 LAB — BRAIN NATRIURETIC PEPTIDE: B Natriuretic Peptide: 372.1 pg/mL — ABNORMAL HIGH (ref 0.0–100.0)

## 2021-06-17 MED ORDER — MAGNESIUM OXIDE -MG SUPPLEMENT 400 (240 MG) MG PO TABS: 200.0000 mg | ORAL_TABLET | Freq: Every day | ORAL | Status: DC

## 2021-06-17 MED ORDER — DIAZEPAM 5 MG PO TABS: 5.0000 mg | ORAL_TABLET | Freq: Every day | ORAL | Status: DC | PRN

## 2021-06-17 MED ORDER — ONDANSETRON HCL 4 MG/2ML IJ SOLN: 4.0000 mg | Freq: Four times a day (QID) | INTRAMUSCULAR | Status: DC | PRN

## 2021-06-17 MED ORDER — SPIRONOLACTONE 12.5 MG HALF TABLET
12.5000 mg | ORAL_TABLET | Freq: Every day | ORAL | Status: DC
  Administered 2021-06-17: 12.5 mg via ORAL
  Filled 2021-06-17: qty 1

## 2021-06-17 MED ORDER — ASCORBIC ACID 500 MG PO TABS
500.0000 mg | ORAL_TABLET | Freq: Every day | ORAL | Status: DC
  Administered 2021-06-17: 500 mg via ORAL
  Filled 2021-06-17: qty 1

## 2021-06-17 MED ORDER — METOPROLOL SUCCINATE ER 25 MG PO TB24: 12.5000 mg | ORAL_TABLET | Freq: Every day | ORAL | Status: DC

## 2021-06-17 MED ORDER — PANTOPRAZOLE SODIUM 40 MG PO TBEC
40.0000 mg | DELAYED_RELEASE_TABLET | Freq: Every day | ORAL | Status: DC
  Administered 2021-06-17: 40 mg via ORAL
  Filled 2021-06-17: qty 1

## 2021-06-17 MED ORDER — ATORVASTATIN CALCIUM 40 MG PO TABS: 40.0000 mg | ORAL_TABLET | Freq: Every day | ORAL | Status: DC

## 2021-06-17 MED ORDER — ZINC SULFATE 220 (50 ZN) MG PO CAPS
220.0000 mg | ORAL_CAPSULE | Freq: Every day | ORAL | Status: DC
  Administered 2021-06-17: 220 mg via ORAL
  Filled 2021-06-17: qty 1

## 2021-06-17 MED ORDER — ACETAMINOPHEN 500 MG PO TABS: 1000.0000 mg | ORAL_TABLET | Freq: Four times a day (QID) | ORAL | Status: DC | PRN

## 2021-06-17 MED ORDER — APIXABAN 5 MG PO TABS
5.0000 mg | ORAL_TABLET | Freq: Two times a day (BID) | ORAL | Status: DC
  Administered 2021-06-17: 5 mg via ORAL
  Filled 2021-06-17: qty 1

## 2021-06-17 MED ORDER — MELATONIN 5 MG PO TABS: 5.0000 mg | ORAL_TABLET | Freq: Every evening | ORAL | Status: DC | PRN

## 2021-06-17 MED ORDER — TRAZODONE HCL 50 MG PO TABS: 25.0000 mg | ORAL_TABLET | Freq: Every evening | ORAL | Status: DC | PRN

## 2021-06-17 MED ORDER — ZINC GLUCONATE 50 MG PO TABS: 50.0000 mg | ORAL_TABLET | Freq: Every day | ORAL | Status: DC

## 2021-06-17 NOTE — ED Provider Notes (Signed)
[facility] EMERGENCY DEPARTMENT Provider Note   CSN: [id number] Arrival date & time: 06/16/21  2351     History  Chief Complaint  Patient presents with   Palpitations    Brittany Kirk is a 76 y.o. female.  Patient presents to the emergency department for evaluation of chest discomfort, palpitations, dizziness.  Patient reports that she felt a pounding in her chest and took her blood pressure, heart rate was in the 130s.  Patient took a 30 mg diltiazem tablet but heart palpitations did not resolve.  She does have a history of atrial fibrillation, status post ablation in November.      Home Medications Prior to Admission medications   Medication Sig Start Date End Date Taking? Authorizing Provider  acetaminophen (TYLENOL) 500 MG tablet Take 1,000 mg by mouth every 6 (six) hours as needed for moderate pain or headache.   Yes [provider]  alendronate (FOSAMAX) 70 MG tablet Take 1 tablet (70 mg total) by mouth every 7 (seven) days. Take with a full glass of water on an empty stomach. Patient taking differently: Take 70 mg by mouth every Sunday. Take with a full glass of water on an empty stomach. 04/03/21  Yes Susy Frizzle, MD  ascorbic acid (VITAMIN C) 500 MG tablet Take 500 mg by mouth daily.   Yes [provider]  atorvastatin (LIPITOR) 40 MG tablet TAKE 1 TABLET BY MOUTH EVERYDAY AT BEDTIME Patient taking differently: Take 40 mg by mouth at bedtime. 03/14/21  Yes Susy Frizzle, MD  Calcium Carbonate (CALCIUM 500 PO) Take 500 mg by mouth daily.   Yes [provider]  cholecalciferol (VITAMIN D3) 25 MCG (1000 UNIT) tablet Take 1,000 Units by mouth daily.   Yes [provider]  Cyanocobalamin (B-12) 1000 MCG TBCR Take 1,000 mcg by mouth daily.   Yes [provider]  diazepam (VALIUM) 5 MG tablet Take 5 mg by mouth daily as needed for anxiety. 12/13/20  Yes [provider]  diltiazem (CARDIZEM) 30 MG  tablet Take 1 table as needed for palpitations. Can take 2nd table after 3 hours if persistent palpitations. Patient taking differently: Take 30 mg by mouth as needed (palpitations). 01/13/21  Yes Bhagat, Bhavinkumar, PA  ELIQUIS 5 MG TABS tablet TAKE 1 TABLET BY MOUTH TWICE A DAY Patient taking differently: Take 5 mg by mouth 2 (two) times daily. 04/10/21  Yes Lorretta Harp, MD  MAGNESIUM OXIDE PO Take 250 mg by mouth at bedtime.   Yes [provider]  melatonin 5 MG TABS Take 5 mg by mouth at bedtime as needed (sleep).   Yes [provider]  metoprolol succinate (TOPROL-XL) 25 MG 24 hr tablet TAKE 1/2 TABLET BY MOUTH EVERY DAY Patient taking differently: 12.5 mg daily. 06/07/21  Yes Lorretta Harp, MD  omeprazole (PRILOSEC) 20 MG capsule Take 20 mg by mouth every evening. 07/06/19  Yes [provider]  spironolactone (ALDACTONE) 25 MG tablet Take 0.5 tablets (12.5 mg total) by mouth daily. 04/28/21  Yes Lorretta Harp, MD  traZODone (DESYREL) 50 MG tablet Take 0.5-1 tablets (25-50 mg total) by mouth at bedtime as needed. for sleep Patient taking differently: Take 25-50 mg by mouth at bedtime as needed for sleep. 02/15/21  Yes Susy Frizzle, MD  zinc gluconate 50 MG tablet Take 50 mg by mouth daily.   Yes [provider]  doxycycline (VIBRAMYCIN) 100 MG capsule Take 100 mg by mouth See admin  instructions. Bid x 7 days Patient not taking: Reported on 06/17/2021    [provider]      Allergies    Penicillins, Levofloxacin, Cefaclor, Latex, Metronidazole, Naproxen, Nsaids, Septra [bactrim], Sulfonamide derivatives, and Tape    Review of Systems   Review of Systems  Physical Exam Updated Vital Signs BP 127/69    Pulse 75    Temp 97.6 F (36.4 C) (Oral)    Resp 16    Ht 4\' 11"  (1.499 m)    Wt 55.8 kg    SpO2 100%    BMI 24.84 kg/m  Physical Exam Vitals and nursing note reviewed.  Constitutional:      General: She is not in acute  distress.    Appearance: Normal appearance. She is well-developed.  HENT:     Head: Normocephalic and atraumatic.     Right Ear: Hearing normal.     Left Ear: Hearing normal.     Nose: Nose normal.  Eyes:     Conjunctiva/sclera: Conjunctivae normal.     Pupils: Pupils are equal, round, and reactive to light.  Cardiovascular:     Rate and Rhythm: Rhythm irregularly irregular.     Heart sounds: S1 normal and S2 normal. No murmur heard.   No friction rub. No gallop.  Pulmonary:     Effort: Pulmonary effort is normal. No respiratory distress.     Breath sounds: Normal breath sounds.  Chest:     Chest wall: No tenderness.  Abdominal:     General: Bowel sounds are normal.     Palpations: Abdomen is soft.     Tenderness: There is no abdominal tenderness. There is no guarding or rebound. Negative signs include Murphy's sign and McBurney's sign.     Hernia: No hernia is present.  Musculoskeletal:        General: Normal range of motion.     Cervical back: Normal range of motion and neck supple.  Skin:    General: Skin is warm and dry.     Findings: No rash.  Neurological:     Mental Status: She is alert and oriented to person, place, and time.     GCS: GCS eye subscore is 4. GCS verbal subscore is 5. GCS motor subscore is 6.     Cranial Nerves: No cranial nerve deficit.     Sensory: No sensory deficit.     Coordination: Coordination normal.  Psychiatric:        Speech: Speech normal.        Behavior: Behavior normal.        Thought Content: Thought content normal.    ED Results / Procedures / Treatments   Labs (all labs ordered are listed, but only abnormal results are displayed) Labs Reviewed  CBC WITH DIFFERENTIAL/PLATELET - Abnormal; Notable for the following components:      Result Value   RBC 3.71 (*)    All other components within normal limits  BASIC METABOLIC PANEL - Abnormal; Notable for the following components:   Glucose, Bld 106 (*)    Calcium 8.5 (*)    All  other components within normal limits  BRAIN NATRIURETIC PEPTIDE - Abnormal; Notable for the following components:   B Natriuretic Peptide 372.1 (*)    All other components within normal limits  RESP PANEL BY RT-PCR (FLU A&B, COVID) [id number]  TROPONIN I (HIGH SENSITIVITY)  TROPONIN I (HIGH SENSITIVITY)    EKG None  Radiology DG Chest Port 1 View  Result Date:  06/17/2021 CLINICAL DATA:  Shortness of breath EXAM: PORTABLE CHEST 1 VIEW COMPARISON:  03/21/2021 FINDINGS: Cardiomegaly. Lungs clear. No effusions or edema. No acute bony abnormality. IMPRESSION: Cardiomegaly.  No active disease. Electronically Signed   By: Rolm Baptise M.D.   On: 06/17/2021 00:29    Procedures Procedures    Medications Ordered in ED Medications - No data to display  ED Course/ Medical Decision Making/ A&P                           Medical Decision Making  Patient with history of CAD, hypertension, hyperlipidemia, paroxysmal atrial fibrillation status post ablation, COPD, chronic kidney disease presents with chest pain and heart palpitations.  Patient in atrial fibrillation, rate controlled on cardiac monitor  Patient reports experiencing heart rate in the 130s with a pounding in her chest and chest pain earlier tonight.  EMS documented what appeared to be atrial flutter with a variable block.  Here in the department, however, she is in atrial fibrillation, rate controlled.  Symptoms are improved.  Patient is on Eliquis, has not missed any doses.  Patient indicates that she took a 30 mg diltiazem tablet tonight when she started having the pounding in her chest.  She does have a history of paroxysmal atrial fibrillation but has not had any episodes since she had an ablation in November.  Reviewing patient's records reveals that she does have coronary artery disease in including an 85% Ostial Ramus lesion that was not amenable to PCI because of the possibility of pushing plaque into LAD.  This is being medically  managed.  We will have hospitalist admit the patient and further monitor, cardiology consult in the morning to reevaluate recurrent atrial fibrillation.  Cycle enzymes to further rule out troponin leak, NSTEMI.  First troponin negative.        Final Clinical Impression(s) / ED Diagnoses Final diagnoses:  Chest pain, unspecified type  Paroxysmal A-fib [facility])    Rx / DC Orders ED Discharge Orders     None         Ameri Cahoon, Gwenyth Allegra, MD 06/17/21 [id number]

## 2021-06-17 NOTE — Significant Event (Signed)
Pt converted out of A.Fib, now NSR rate 60.

## 2021-06-17 NOTE — Discharge Summary (Signed)
Physician Discharge Summary  Brittany Kirk TOI:[id number] DOB: [birth year] DOA: 06/16/2021  PCP: Brittany Frizzle, MD  Admit date: 06/16/2021 Discharge date: 06/17/2021  Recommendations for Outpatient Follow-up:  Discharge to home. Follow up with PCP in 7-10 days. Follow up with cardiology in 2 weeks.   Discharge Diagnoses: Principal diagnosis is #1 Atrial fibrillation with RVR/Pt on Eliquis COVID 19 positive - asymptomatic except for atrial fibrillation  Hypertension CAD  Discharge Condition: Fair Disposition: Home  Diet recommendation: Heart healthy  Filed Weights   06/16/21 2359  Weight: 55.8 kg    History of present illness:  Brittany Kirk is a 76 y.o. female with medical history significant of CAD with 85% ostial lesion that they cant stent without risking LAD, PAF s/p ablation several months ago on Eliquis.   Pt tested COVID+ on Sun.   Pt developed palpitations tonight 1h PTA.  Slight dizziness.  Took cardizem + 4 baby ASA.     ED Course: HR with EMS showed A.flutter with variable block, rate of 130.   Symptoms have since resolved (presumably as cardizem took effect) and HR now 94, A.Fib/flutter based on the EKG.   Trops were 10 and 10.   Covid is unsurprisingly positive.   She is satting 100% on room air and CXR is neg.  Hospital Course:  [facility] were consulted to admit the patient. She was given diltiazem as she takes it at home. Cardiology was consulted. The patient converted to NSR within an hour after admission. I discussed the patient with Dr. Curt Bears. He stated that the patient was stable and he had no further recommendations and stated that she was clear for discharge from a cardiological standpoint.  I explained to the patient that due to COVID-19 she is at increased risk to trip over into Atrial fibrillation and that she was to follow the instructions given to her by her cardiologist. If she has a fast heart rate, she is to take 30 mg of  diltiazem. If her tachycardia isn't resolved after three hours, she is to take one more. If it remains high after the second one, she is to come to the ED. She is to come to the ED if at any point she has chest pain, shortness of breath, or dizziness/lightheadedness, she was instructed to come to the ED.   She is discharged to home in fair condition. She is to follow up with her PCP and cardiology as outpatient.  Rhythm at the time of discharge was NSR at 73 bpm.  Today's assessment: S: The patient is resting comfortably. No new complaints. O: Vitals:  Vitals:   06/17/21 1145 06/17/21 1226  BP: (!) 138/56 138/65  Pulse: 68 70  Resp: 19 17  Temp:  98.9 F (37.2 C)  SpO2: 100% 96%    Exam:  Constitutional:  The patient is awake, alert, and oriented x 3. No acute distress. Respiratory:  No increased work of breathing. No wheezes, rales, or rhonchi No tactile fremitus Cardiovascular:  Regular rate and rhythm No murmurs, ectopy, or gallups. No lateral PMI. No thrills. Abdomen:  Abdomen is soft, non-tender, non-distended No hernias, masses, or organomegaly Normoactive bowel sounds.  Musculoskeletal:  No cyanosis, clubbing, or edema Skin:  No rashes, lesions, ulcers palpation of skin: no induration or nodules Neurologic:  CN 2-12 intact Sensation all 4 extremities intact Psychiatric:  Mental status Mood, affect appropriate Orientation to person, place, time  judgment and insight appear intact  Discharge Instructions  Discharge Instructions  Activity as tolerated - No restrictions   Complete by: As directed    Call MD for:  persistant nausea and vomiting   Complete by: As directed    Call MD for:  severe uncontrolled pain   Complete by: As directed    Diet - low sodium heart healthy   Complete by: As directed    Discharge instructions   Complete by: As directed    Discharge to home. Follow up with PCP in 7-10 days. Follow up with cardiology in 2 weeks.    Increase activity slowly   Complete by: As directed       Allergies as of 06/17/2021       Reactions   Penicillins Anaphylaxis, Itching, Swelling, Rash   Has patient had a PCN reaction causing immediate rash, facial/tongue/throat swelling, SOB or lightheadedness with hypotension: Yes Has patient had a PCN reaction causing severe rash involving mucus membranes or skin necrosis: No Has patient had a PCN reaction that required hospitalization: Yes Has patient had a PCN reaction occurring within the last 10 years: No If all of the above answers are "NO", then may proceed with Cephalosporin use.   Levofloxacin Nausea Only, Other (See Comments)   Causes headache, fatigue and dizziness   Cefaclor Itching, Swelling, Rash   Latex Itching, Rash   Metronidazole Itching, Swelling, Rash   Naproxen Itching, Swelling, Rash   Nsaids Itching, Swelling, Rash   Ibuprofen IS tolerated   Septra [bactrim] Itching, Swelling, Rash   Sulfonamide Derivatives Itching, Swelling, Rash   Tape Itching, Rash   ONLY USE PAPER TAPE        Medication List     TAKE these medications    acetaminophen 500 MG tablet Commonly known as: TYLENOL Take 1,000 mg by mouth every 6 (six) hours as needed for moderate pain or headache.   alendronate 70 MG tablet Commonly known as: FOSAMAX Take 1 tablet (70 mg total) by mouth every 7 (seven) days. Take with a full glass of water on an empty stomach. What changed: when to take this   ascorbic acid 500 MG tablet Commonly known as: VITAMIN C Take 500 mg by mouth daily.   atorvastatin 40 MG tablet Commonly known as: LIPITOR TAKE 1 TABLET BY MOUTH EVERYDAY AT BEDTIME What changed: See the new instructions.   B-12 1000 MCG Tbcr Take 1,000 mcg by mouth daily.   CALCIUM 500 PO Take 500 mg by mouth daily.   cholecalciferol 25 MCG (1000 UNIT) tablet Commonly known as: VITAMIN D3 Take 1,000 Units by mouth daily.   diazepam 5 MG tablet Commonly known as: VALIUM Take  5 mg by mouth daily as needed for anxiety.   diltiazem 30 MG tablet Commonly known as: Cardizem Take 1 table as needed for palpitations. Can take 2nd table after 3 hours if persistent palpitations. What changed:  how much to take how to take this when to take this reasons to take this additional instructions   Eliquis 5 MG Tabs tablet Generic drug: apixaban TAKE 1 TABLET BY MOUTH TWICE A DAY What changed: how much to take   MAGNESIUM OXIDE PO Take 250 mg by mouth at bedtime.   melatonin 5 MG Tabs Take 5 mg by mouth at bedtime as needed (sleep).   metoprolol succinate 25 MG 24 hr tablet Commonly known as: TOPROL-XL TAKE 1/2 TABLET BY MOUTH EVERY DAY What changed: how to take this   omeprazole 20 MG capsule Commonly known as: PRILOSEC Take 20 mg by  mouth every evening.   spironolactone 25 MG tablet Commonly known as: ALDACTONE Take 0.5 tablets (12.5 mg total) by mouth daily.   traZODone 50 MG tablet Commonly known as: DESYREL Take 0.5-1 tablets (25-50 mg total) by mouth at bedtime as needed. for sleep What changed:  reasons to take this additional instructions   zinc gluconate 50 MG tablet Take 50 mg by mouth daily.       Allergies  Allergen Reactions   Penicillins Anaphylaxis, Itching, Swelling and Rash    Has patient had a PCN reaction causing immediate rash, facial/tongue/throat swelling, SOB or lightheadedness with hypotension: Yes Has patient had a PCN reaction causing severe rash involving mucus membranes or skin necrosis: No Has patient had a PCN reaction that required hospitalization: Yes Has patient had a PCN reaction occurring within the last 10 years: No If all of the above answers are "NO", then may proceed with Cephalosporin use.    Levofloxacin Nausea Only and Other (See Comments)    Causes headache, fatigue and dizziness   Cefaclor Itching, Swelling and Rash   Latex Itching and Rash   Metronidazole Itching, Swelling and Rash   Naproxen  Itching, Swelling and Rash   Nsaids Itching, Swelling and Rash    Ibuprofen IS tolerated   Septra [Bactrim] Itching, Swelling and Rash   Sulfonamide Derivatives Itching, Swelling and Rash   Tape Itching and Rash    ONLY USE PAPER TAPE    The results of significant diagnostics from this hospitalization (including imaging, microbiology, ancillary and laboratory) are listed below for reference.    Significant Diagnostic Studies: DG Chest Port 1 View  Result Date: 06/17/2021 CLINICAL DATA:  Shortness of breath EXAM: PORTABLE CHEST 1 VIEW COMPARISON:  03/21/2021 FINDINGS: Cardiomegaly. Lungs clear. No effusions or edema. No acute bony abnormality. IMPRESSION: Cardiomegaly.  No active disease. Electronically Signed   By: Rolm Baptise M.D.   On: 06/17/2021 00:29    Microbiology: Recent Results (from the past 240 hour(s))  Resp Panel by RT-PCR (Flu A&B, Covid) Nasopharyngeal Swab     Status: Abnormal   Collection Time: 06/17/21  2:24 AM   Specimen: Nasopharyngeal Swab; Nasopharyngeal(NP) swabs in vial transport medium  Result Value Ref Range Status   SARS Coronavirus 2 by RT PCR POSITIVE (A) NEGATIVE Final    Comment: (NOTE) SARS-CoV-2 target nucleic acids are DETECTED.  The SARS-CoV-2 RNA is generally detectable in upper respiratory specimens during the acute phase of infection. Positive results are indicative of the presence of the identified virus, but do not rule out bacterial infection or co-infection with other pathogens not detected by the test. Clinical correlation with patient history and other diagnostic information is necessary to determine patient infection status. The expected result is Negative.  Fact Sheet for Patients: EntrepreneurPulse.com.au  Fact Sheet for Healthcare Providers: IncredibleEmployment.be  This test is not yet approved or cleared by the [place] FDA and  has been authorized for detection and/or diagnosis of  SARS-CoV-2 by FDA under an Emergency Use Authorization (EUA).  This EUA will remain in effect (meaning this test can be used) for the duration of  the COVID-19 declaration under Section 564(b)(1) of the A ct, 21 U.S.C. section 360bbb-3(b)(1), unless the authorization is terminated or revoked sooner.     Influenza A by PCR NEGATIVE NEGATIVE Final   Influenza B by PCR NEGATIVE NEGATIVE Final    Comment: (NOTE) The Xpert Xpress SARS-CoV-2/FLU/RSV plus assay is intended as an aid in the diagnosis of influenza from  Nasopharyngeal swab specimens and should not be used as a sole basis for treatment. Nasal washings and aspirates are unacceptable for Xpert Xpress SARS-CoV-2/FLU/RSV testing.  Fact Sheet for Patients: EntrepreneurPulse.com.au  Fact Sheet for Healthcare Providers: IncredibleEmployment.be  This test is not yet approved or cleared by the [place] FDA and has been authorized for detection and/or diagnosis of SARS-CoV-2 by FDA under an Emergency Use Authorization (EUA). This EUA will remain in effect (meaning this test can be used) for the duration of the COVID-19 declaration under Section 564(b)(1) of the Act, 21 U.S.C. section 360bbb-3(b)(1), unless the authorization is terminated or revoked.  Performed at [facility], [place] [address], [place], [place] [address]      Labs: Basic Metabolic Panel: Recent Labs  Lab 06/17/21 0013  NA 136  K 3.5  CL 105  CO2 23  GLUCOSE 106*  BUN 19  CREATININE 0.91  CALCIUM 8.5*   Liver Function Tests: No results for input(s): AST, ALT, ALKPHOS, BILITOT, PROT, ALBUMIN in the last 168 hours. No results for input(s): LIPASE, AMYLASE in the last 168 hours. No results for input(s): AMMONIA in the last 168 hours. CBC: Recent Labs  Lab 06/17/21 0013  WBC 7.0  NEUTROABS 3.8  HGB 12.3  HCT 37.0  MCV 99.7  PLT 245   Cardiac Enzymes: No results for input(s): CKTOTAL, CKMB, CKMBINDEX,  TROPONINI in the last 168 hours. BNP: BNP (last 3 results) Recent Labs    06/17/21 0013  BNP 372.1*    ProBNP (last 3 results) No results for input(s): PROBNP in the last 8760 hours.  CBG: No results for input(s): GLUCAP in the last 168 hours.  Principal Problem:   Atrial fibrillation with RVR (HCC) Active Problems:   CAD in native artery   Accelerated hypertension   COVID-19 virus infection   Time coordinating discharge: 38  Signed:        Cabe Lashley, DO [facility]  06/17/2021, 4:50 PM

## 2021-06-17 NOTE — H&P (Signed)
History and Physical    Brittany Kirk KDT:[id number] DOB: [birth year] DOA: 06/16/2021  PCP: Susy Frizzle, MD  Patient coming from: Home  I have personally briefly reviewed patient's old medical records in [place]  Chief Complaint: Palpitations  HPI: Brittany Kirk is a 76 y.o. female with medical history significant of CAD with 85% ostial lesion that they cant stent without risking LAD, PAF s/p ablation several months ago on Eliquis.  Pt tested COVID+ on Sun.  Pt developed palpitations tonight 1h PTA.  Slight dizziness.  Took cardizem + 4 baby ASA.   ED Course: HR with EMS showed A.flutter with variable block, rate of 130.  Symptoms have since resolved (presumably as cardizem took effect) and HR now 94, A.Fib/flutter based on the EKG.  Trops were 10 and 10.  Covid is unsurprisingly positive.  She is satting 100% on room air and CXR is neg.   Review of Systems: As per HPI, otherwise all review of systems negative.  Past Medical History:  Diagnosis Date   Acid reflux    Allergic rhinitis    Anxiety    Asymptomatic bilateral carotid artery stenosis    40-59% (02/2018)   Atrial fibrillation ([place]) 07/2019   Breast cancer ([place]) 2015   Right Breast Cancer   CAD (coronary artery disease)    Depression    Emphysema    no longer uses inhalers   Gallstones    nausea, pain upper right abdomen   GERD (gastroesophageal reflux disease)    H/O hiatal hernia    Hiatal hernia    History of skin cancer    Hyperlipidemia    Hypertension    Multinodular goiter (nontoxic)    Scoliosis    Skin cancer    Tobacco user    Wears dentures    top    Past Surgical History:  Procedure Laterality Date   ATRIAL FIBRILLATION ABLATION N/A 04/14/2021   Procedure: ATRIAL FIBRILLATION ABLATION;  Surgeon: Constance Haw, MD;  Location: [facility];  Service: Cardiovascular;  Laterality: N/A;   BREAST LUMPECTOMY Right 2015   CHOLECYSTECTOMY  07/08/2012    Procedure: LAPAROSCOPIC CHOLECYSTECTOMY WITH INTRAOPERATIVE CHOLANGIOGRAM;  Surgeon: Gayland Curry, MD,FACS;  Location: [facility];  Service: General;  Laterality: N/A;  Laparoscopic Cholecystectomy with Intraoperative Cholangiogram   COLONOSCOPY     ESOPHAGOSCOPY N/A 05/29/2017   Procedure: ESOPHAGOSCOPY;  Surgeon: Clarene Essex, MD;  Location: [facility];  Service: Endoscopy;  Laterality: N/A;  botox injection   HAND SURGERY Left    wrist   LEFT HEART CATH AND CORONARY ANGIOGRAPHY N/A 10/11/2017   Procedure: LEFT HEART CATH AND CORONARY ANGIOGRAPHY;  Surgeon: Martinique, Peter M, MD;  Location: [facility];  Service: Cardiovascular;  Laterality: N/A;   TONSILECTOMY, ADENOIDECTOMY, BILATERAL MYRINGOTOMY AND TUBES     TUBAL LIGATION       reports that she quit smoking about 10 years ago. Her smoking use included cigarettes. She has a 23.00 pack-year smoking history. She has never used smokeless tobacco. She reports that she does not drink alcohol and does not use drugs.  Allergies  Allergen Reactions   Penicillins Anaphylaxis, Itching, Swelling and Rash    Has patient had a PCN reaction causing immediate rash, facial/tongue/throat swelling, SOB or lightheadedness with hypotension: Yes Has patient had a PCN reaction causing severe rash involving mucus membranes or skin necrosis: No Has patient had a PCN reaction that required hospitalization: Yes Has patient had a PCN  reaction occurring within the last 10 years: No If all of the above answers are "NO", then may proceed with Cephalosporin use.    Levofloxacin Nausea Only and Other (See Comments)    Causes headache, fatigue and dizziness   Cefaclor Itching, Swelling and Rash   Latex Itching and Rash   Metronidazole Itching, Swelling and Rash   Naproxen Itching, Swelling and Rash   Nsaids Itching, Swelling and Rash    Ibuprofen IS tolerated   Septra [Bactrim] Itching, Swelling and Rash   Sulfonamide Derivatives Itching, Swelling and Rash    Tape Itching and Rash    ONLY USE PAPER TAPE    Family History  Problem Relation Age of Onset   Arthritis Mother    Pancreatic cancer Mother    Heart disease Other        5 brothers and 2 sister   Diverticulitis Sister    Heart disease Sister    Aplastic anemia Other      Prior to Admission medications   Medication Sig Start Date End Date Taking? Authorizing Provider  acetaminophen (TYLENOL) 500 MG tablet Take 1,000 mg by mouth every 6 (six) hours as needed for moderate pain or headache.   Yes [provider]  alendronate (FOSAMAX) 70 MG tablet Take 1 tablet (70 mg total) by mouth every 7 (seven) days. Take with a full glass of water on an empty stomach. Patient taking differently: Take 70 mg by mouth every Sunday. Take with a full glass of water on an empty stomach. 04/03/21  Yes Susy Frizzle, MD  ascorbic acid (VITAMIN C) 500 MG tablet Take 500 mg by mouth daily.   Yes [provider]  atorvastatin (LIPITOR) 40 MG tablet TAKE 1 TABLET BY MOUTH EVERYDAY AT BEDTIME Patient taking differently: Take 40 mg by mouth at bedtime. 03/14/21  Yes Susy Frizzle, MD  Calcium Carbonate (CALCIUM 500 PO) Take 500 mg by mouth daily.   Yes [provider]  cholecalciferol (VITAMIN D3) 25 MCG (1000 UNIT) tablet Take 1,000 Units by mouth daily.   Yes [provider]  Cyanocobalamin (B-12) 1000 MCG TBCR Take 1,000 mcg by mouth daily.   Yes [provider]  diazepam (VALIUM) 5 MG tablet Take 5 mg by mouth daily as needed for anxiety. 12/13/20  Yes [provider]  diltiazem (CARDIZEM) 30 MG tablet Take 1 table as needed for palpitations. Can take 2nd table after 3 hours if persistent palpitations. Patient taking differently: Take 30 mg by mouth as needed (palpitations). 01/13/21  Yes Bhagat, Bhavinkumar, PA  ELIQUIS 5 MG TABS tablet TAKE 1 TABLET BY MOUTH TWICE A DAY Patient taking differently: Take 5 mg by mouth 2 (two) times daily. 04/10/21  Yes  Lorretta Harp, MD  MAGNESIUM OXIDE PO Take 250 mg by mouth at bedtime.   Yes [provider]  melatonin 5 MG TABS Take 5 mg by mouth at bedtime as needed (sleep).   Yes [provider]  metoprolol succinate (TOPROL-XL) 25 MG 24 hr tablet TAKE 1/2 TABLET BY MOUTH EVERY DAY Patient taking differently: 12.5 mg daily. 06/07/21  Yes Lorretta Harp, MD  omeprazole (PRILOSEC) 20 MG capsule Take 20 mg by mouth every evening. 07/06/19  Yes [provider]  spironolactone (ALDACTONE) 25 MG tablet Take 0.5 tablets (12.5 mg total) by mouth daily. 04/28/21  Yes Lorretta Harp, MD  traZODone (DESYREL) 50 MG tablet Take 0.5-1 tablets (25-50 mg total) by mouth at bedtime as needed.  for sleep Patient taking differently: Take 25-50 mg by mouth at bedtime as needed for sleep. 02/15/21  Yes Susy Frizzle, MD  zinc gluconate 50 MG tablet Take 50 mg by mouth daily.   Yes [provider]    Physical Exam: Vitals:   06/16/21 2359 06/17/21 0100 06/17/21 0200 06/17/21 0300  BP:  (!) 123/59 127/69 (!) 153/52  Pulse:  61 75 90  Resp:  20 16 17   Temp:      TempSrc:      SpO2:  99% 100% 100%  Weight: 55.8 kg     Height: 4\' 11"  (1.499 m)       Constitutional: NAD, calm, comfortable Eyes: PERRL, lids and conjunctivae normal ENMT: Mucous membranes are moist. Posterior pharynx clear of any exudate or lesions.Normal dentition.  Neck: normal, supple, no masses, no thyromegaly Respiratory: clear to auscultation bilaterally, no wheezing, no crackles. Normal respiratory effort. No accessory muscle use.  Cardiovascular: IRR, IRR Abdomen: no tenderness, no masses palpated. No hepatosplenomegaly. Bowel sounds positive.  Musculoskeletal: no clubbing / cyanosis. No joint deformity upper and lower extremities. Good ROM, no contractures. Normal muscle tone.  Skin: no rashes, lesions, ulcers. No induration Neurologic: CN 2-12 grossly intact. Sensation intact, DTR normal. Strength  5/5 in all 4.  Psychiatric: Normal judgment and insight. Alert and oriented x 3. Normal mood.    Labs on Admission: I have personally reviewed following labs and imaging studies  CBC: Recent Labs  Lab 06/17/21 0013  WBC 7.0  NEUTROABS 3.8  HGB 12.3  HCT 37.0  MCV 99.7  PLT 676   Basic Metabolic Panel: Recent Labs  Lab 06/17/21 0013  NA 136  K 3.5  CL 105  CO2 23  GLUCOSE 106*  BUN 19  CREATININE 0.91  CALCIUM 8.5*   GFR: Estimated Creatinine Clearance: 39.4 mL/min (by C-G formula based on SCr of 0.91 mg/dL). Liver Function Tests: No results for input(s): AST, ALT, ALKPHOS, BILITOT, PROT, ALBUMIN in the last 168 hours. No results for input(s): LIPASE, AMYLASE in the last 168 hours. No results for input(s): AMMONIA in the last 168 hours. Coagulation Profile: No results for input(s): INR, PROTIME in the last 168 hours. Cardiac Enzymes: No results for input(s): CKTOTAL, CKMB, CKMBINDEX, TROPONINI in the last 168 hours. BNP (last 3 results) No results for input(s): PROBNP in the last 8760 hours. HbA1C: No results for input(s): HGBA1C in the last 72 hours. CBG: No results for input(s): GLUCAP in the last 168 hours. Lipid Profile: No results for input(s): CHOL, HDL, LDLCALC, TRIG, CHOLHDL, LDLDIRECT in the last 72 hours. Thyroid Function Tests: No results for input(s): TSH, T4TOTAL, FREET4, T3FREE, THYROIDAB in the last 72 hours. Anemia Panel: No results for input(s): VITAMINB12, FOLATE, FERRITIN, TIBC, IRON, RETICCTPCT in the last 72 hours. Urine analysis:    Component Value Date/Time   COLORURINE YELLOW 04/18/2021 1546   APPEARANCEUR CLEAR 04/18/2021 1546   APPEARANCEUR Clear 04/05/2020 1412   LABSPEC 1.010 04/18/2021 1546   PHURINE < OR = 5.0 04/18/2021 1546   GLUCOSEU NEGATIVE 04/18/2021 1546   HGBUR NEGATIVE 04/18/2021 1546   BILIRUBINUR Negative 04/05/2020 [place] 04/18/2021 1546   PROTEINUR NEGATIVE 04/18/2021 1546   UROBILINOGEN 0.2  10/06/2014 1614   NITRITE NEGATIVE 04/18/2021 1546   LEUKOCYTESUR NEGATIVE 04/18/2021 1546    Radiological Exams on Admission: DG Chest Port 1 View  Result Date: 06/17/2021 CLINICAL DATA:  Shortness of breath EXAM: PORTABLE CHEST 1 VIEW COMPARISON:  03/21/2021  FINDINGS: Cardiomegaly. Lungs clear. No effusions or edema. No acute bony abnormality. IMPRESSION: Cardiomegaly.  No active disease. Electronically Signed   By: Rolm Baptise M.D.   On: 06/17/2021 00:29    EKG: Independently reviewed.  Assessment/Plan Principal Problem:   Atrial fibrillation with RVR (HCC) Active Problems:   CAD in native artery   Accelerated hypertension   COVID-19 virus infection    A.fib RVR - recurrence of A.Fib after ablation several months ago ? Triggered by COVID-19? Asymptomatic at the moment and rate controlled Cont eliquis (CHADS vasc is going to be > 2). EDP spoke with cards fellow: Obs pt And call EP in AM COVID-19 - Mild with No O2 requirement Tested + on Sun No indication at this point for antiviral therapy Not really symptomatic (unless we blame the A.Fib RVR recurrence on it). HTN - Cont home BP meds CAD - Cont eliquis Cont statin   DVT prophylaxis: eliquis Code Status: Full Family Communication: No family in room Disposition Plan: Home after EP eval / recs. Consults called: EDP spoke with cards fellow who said to call EP in AM Admission status: Place in obs    Soo Steel[place], [place] Hospitalists  How to contact the [facility] Attending or Consulting provider [place] or covering provider during after hours [place], for this patient?  Check the care team in [facility] and look for a) attending/consulting TRH provider listed and b) the [facility] team listed Log into [email or web address]  Amion Physician Scheduling and messaging for groups and whole hospitals  On call and physician scheduling software for group practices, residents, hospitalists and other medical providers for call, clinic, rotation  and shift schedules. OnCall [employer] is a hospital-wide system for scheduling doctors and paging doctors on call. EasyPlot is for scientific plotting and data analysis.  [email or web address]  and use [place]'s universal password to access. If you do not have the password, please contact the hospital operator.  Locate the [facility] provider you are looking for under [facility] and page to a number that you can be directly reached. If you still have difficulty reaching the provider, please page the [facility] (Director on Call) for the Hospitalists listed on amion for assistance.  06/17/2021, 4:00 AM

## 2021-06-19 ENCOUNTER — Telehealth: Payer: Self-pay

## 2021-06-19 ENCOUNTER — Other Ambulatory Visit: Payer: Self-pay | Admitting: Family Medicine

## 2021-06-19 MED ORDER — MOLNUPIRAVIR EUA 200MG CAPSULE: 4.0000 | ORAL_CAPSULE | Freq: Two times a day (BID) | ORAL | 0 refills | Status: AC

## 2021-06-19 NOTE — Telephone Encounter (Signed)
LMTRC

## 2021-06-19 NOTE — Telephone Encounter (Signed)
Transition Care Management Follow-up Telephone Call Date of discharge and from where: 06/17/2021 [facility] Diagnosis: Chest pain, elevated HR, dx with Covid on 06/11/2021. How have you been since you were released from the hospital? Spoke with pt's husband, Dema Severin, and he states pt is doing some better.  Any questions or concerns? No  Items Reviewed: Did the pt receive and understand the discharge instructions provided? Yes  Medications obtained and verified? Yes  Other? No  Any new allergies since your discharge? No  Dietary orders reviewed? Yes Do you have support at home? Yes   Home Care and Equipment/Supplies: Were home health services ordered? no If so, what is the name of the agency? N/A  Has the agency set up a time to come to the patient's home? no Were any new equipment or medical supplies ordered?  No What is the name of the medical supply agency? N/A Were you able to get the supplies/equipment? no Do you have any questions related to the use of the equipment or supplies? No  Functional Questionnaire: (I = Independent and D = Dependent) ADLs: I  Bathing/Dressing- I  Meal Prep- I  Eating- I  Maintaining continence- I  Transferring/Ambulation- I  Managing Meds- I  Follow up appointments reviewed:  PCP Hospital f/u appt confirmed? Yes  Scheduled to see Dr. Dennard Schaumann on 06/26/2021 @ 2:15 pm. [facility] f/u appt confirmed? Yes  Scheduled to see Dr. Allegra Lai on 08/08/2021 @ 3:00 pm. Are transportation arrangements needed? No  If their condition worsens, is the pt aware to call PCP or go to the Emergency Dept.? Yes Was the patient provided with contact information for the PCP's office or ED? Yes Was to pt encouraged to call back with questions or concerns? Yes

## 2021-06-19 NOTE — Telephone Encounter (Signed)
Pt advised of rx. Nothing further needed at this time.

## 2021-06-19 NOTE — Telephone Encounter (Signed)
Per chart pt tested POSITIVE for COVID 06/17/20 and has not been treated.  Please advise, thanks!

## 2021-06-19 NOTE — Telephone Encounter (Signed)
Pt called in stating that she just wanted to know if she may need an antibiotic for a cough that is producing yellow/green mucus. Pt was recently d/c'd from hospital for afib and tested positive for covid while there. Pt states that she is not running a fever or has any other symptoms at this time. Please advise.   Cb#: [phone]

## 2021-06-22 ENCOUNTER — Telehealth: Payer: Self-pay | Admitting: Pharmacist

## 2021-06-22 NOTE — Progress Notes (Signed)
Chronic Care Management Pharmacy Assistant   Name: Brittany Kirk  MRN: [id number] DOB: 1945-08-06   Reason for Encounter: Disease State - Hypertension Call     Recent office visits:  06/08/21 Brittany Chapel, NP - Family Medicine - Video visit - URI - Continue supportive care. Increase fluid intake with water or electrolyte solution like pedialyte. Encouraged acetaminophen as needed for fever/pain. Encouraged salt water gargling, chloraseptic spray and throat lozenges. Encouraged OTC guaifenesin. Encouraged saline sinus flushes and/or neti with humidified air.  Follow up if symptoms persist without improvement after 7 days.  If symptoms worsen, return to clinic.  With any sudden onset new chest pain, dizziness, sweating, or shortness of breath, go to ED.  04/11/21 Brittany Luo, MD - Dysuria - Labs were ordered, levofloxacin (LEVAQUIN) 500 MG tablet was prescribed. Follow up as needed.   04/03/21 Brittany Luo, MD - Strain of Lumbar region - xray ordered. alendronate (FOSAMAX) 70 MG tablet neomycin-polymyxin-hydrocortisone (CORTISPORIN) OTIC solution, and traMADol (ULTRAM) 50 MG tablet prescribed. If the x-rays are normal, I will proceed with a CT scan of the pelvis to reassess the lymph nodes. Follow up as needed.   Recent consult visits:  04/25/21 Brittany Burow, MD - Hypertension - Labs were ordered. EKG performed. ECHO performed. Hydrochlorothiazide (HYDRODIURIL) 12.5 MG tablet prescribed. Return in 7-10 days for labs.    Hospital visits: 06/16/21 Medication Reconciliation was completed by comparing discharge summary, patients EMR and Pharmacy list, and upon discussion with patient.  Admitted to the hospital on 06/16/21 due to Chest pain . Discharge date was 06/19/21. Discharged from [facility]?Medications Started at [facility] Discharge:?? None noted.  Medication Changes at Hospital Discharge: None noted.   Medications Discontinued at Hospital Discharge: None  noted.   Medications that remain the same after Hospital Discharge:??  All other medications will remain the same.    Hospital visits: 03/21/21 Medication Reconciliation was completed by comparing discharge summary, patients EMR and Pharmacy list, and upon discussion with patient.  Admitted to the hospital on 03/21/21 due to Chest pain . Discharge date was 03/22/21. Discharged from [facility]?Medications Started at [facility] Discharge:?? oseltamivir (TAMIFLU) 75 MG capsule  Medication Changes at Hospital Discharge: None noted.   Medications Discontinued at Hospital Discharge: None noted.   Medications that remain the same after Hospital Discharge:??  All other medications will remain the same.    Medications: Outpatient Encounter Medications as of 06/22/2021  Medication Sig   acetaminophen (TYLENOL) 500 MG tablet Take 1,000 mg by mouth every 6 (six) hours as needed for moderate pain or headache.   alendronate (FOSAMAX) 70 MG tablet Take 1 tablet (70 mg total) by mouth every 7 (seven) days. Take with a full glass of water on an empty stomach. (Patient taking differently: Take 70 mg by mouth every Sunday. Take with a full glass of water on an empty stomach.)   ascorbic acid (VITAMIN C) 500 MG tablet Take 500 mg by mouth daily.   atorvastatin (LIPITOR) 40 MG tablet TAKE 1 TABLET BY MOUTH EVERYDAY AT BEDTIME (Patient taking differently: Take 40 mg by mouth at bedtime.)   Calcium Carbonate (CALCIUM 500 PO) Take 500 mg by mouth daily.   cholecalciferol (VITAMIN D3) 25 MCG (1000 UNIT) tablet Take 1,000 Units by mouth daily.   Cyanocobalamin (B-12) 1000 MCG TBCR Take 1,000 mcg by mouth daily.   diazepam (VALIUM) 5 MG tablet Take 5 mg by mouth daily as needed  for anxiety.   diltiazem (CARDIZEM) 30 MG tablet Take 1 table as needed for palpitations. Can take 2nd table after 3 hours if persistent palpitations. (Patient taking differently: Take 30 mg by mouth as needed  (palpitations).)   ELIQUIS 5 MG TABS tablet TAKE 1 TABLET BY MOUTH TWICE A DAY (Patient taking differently: Take 5 mg by mouth 2 (two) times daily.)   MAGNESIUM OXIDE PO Take 250 mg by mouth at bedtime.   melatonin 5 MG TABS Take 5 mg by mouth at bedtime as needed (sleep).   metoprolol succinate (TOPROL-XL) 25 MG 24 hr tablet TAKE 1/2 TABLET BY MOUTH EVERY DAY (Patient taking differently: 12.5 mg daily.)   molnupiravir EUA (LAGEVRIO) 200 mg CAPS capsule Take 4 capsules (800 mg total) by mouth 2 (two) times daily for 5 days.   omeprazole (PRILOSEC) 20 MG capsule Take 20 mg by mouth every evening.   spironolactone (ALDACTONE) 25 MG tablet Take 0.5 tablets (12.5 mg total) by mouth daily.   traZODone (DESYREL) 50 MG tablet Take 0.5-1 tablets (25-50 mg total) by mouth at bedtime as needed. for sleep (Patient taking differently: Take 25-50 mg by mouth at bedtime as needed for sleep.)   zinc gluconate 50 MG tablet Take 50 mg by mouth daily.   No facility-administered encounter medications on file as of 06/22/2021.    Current antihypertensive regimen:  Metoprolol Succinate 25 mg Take 0.5 tablets (12.5 mg total) by mouth daily.    How often are you checking your Blood Pressure?  Patient reported checking blood pressures everyday.   Current home BP readings: 159/63 p=51 (06/22/21) then later that morning it was 99/55 p=49 (after taking medication)   What recent interventions/DTPs have been made by any provider to improve Blood Pressure control since last CPP Visit:  Patient reported she has been taking Metoprolol 12.5 mg everyday now.   Any recent hospitalizations or ED visits since last visit with CPP?  Patient had ED visit on 03/21/21 for Flu and Hospitalization   What diet changes have been made to improve Blood Pressure Control?  Patient reported she does watch her sale intake in her diet everyday.   What exercise is being done to improve your Blood Pressure Control?  Patient reported  she is not very active because she is getting over Covid a few weeks ago and still dealing with some fatigue but does walk throughout the day.      Adherence Review: Is the patient currently on ACE/ARB medication? No Does the patient have >5 day gap between last estimated fill dates? No   Care Gaps  AWV: 05/11/21 Colonoscopy: done 10/22/12 DM Eye Exam: N/A DM Foot Exam: N/A Microalbumin: N/A HbgAIC: done 07/15/19 (5.3) DEXA: unknown  Mammogram: done 12/23/20   Star Rating Drugs: atorvastatin (LIPITOR) 40 MG tablet - last filled 06/09/21 90 days   Future Appointments  Date Time Provider [place]  06/26/2021  2:15 PM Susy Frizzle, MD [facility] None  08/08/2021  3:00 PM Constance Haw, MD [facility]  08/29/2021  3:00 PM Lorretta Harp, MD [facility] [place], [place] Pharmacist Assistant  [phone]

## 2021-06-26 ENCOUNTER — Ambulatory Visit (INDEPENDENT_AMBULATORY_CARE_PROVIDER_SITE_OTHER): Payer: Medicare Other | Admitting: Family Medicine

## 2021-06-26 ENCOUNTER — Encounter: Payer: Self-pay | Admitting: Family Medicine

## 2021-06-26 ENCOUNTER — Other Ambulatory Visit: Payer: Self-pay

## 2021-06-26 VITALS — BP 122/58 | HR 55 | Temp 97.4°F | Resp 18 | Ht 59.02 in | Wt 130.0 lb

## 2021-06-26 DIAGNOSIS — I480 Paroxysmal atrial fibrillation: Secondary | ICD-10-CM

## 2021-06-26 DIAGNOSIS — U071 COVID-19: Secondary | ICD-10-CM

## 2021-06-26 NOTE — Progress Notes (Signed)
Subjective:    Patient ID: Brittany Kirk, female    DOB: 07-06-1945, 76 y.o.   MRN: [id number]  Admit date: 06/16/2021 Discharge date: 06/17/2021   Recommendations for Outpatient Follow-up:  Discharge to home. Follow up with PCP in 7-10 days. Follow up with cardiology in 2 weeks.     Discharge Diagnoses: Principal diagnosis is #1 Atrial fibrillation with RVR/Pt on Eliquis COVID 19 positive - asymptomatic except for atrial fibrillation  Hypertension CAD   Discharge Condition: Fair Disposition: Home   Diet recommendation: Heart healthy      Filed Weights    06/16/21 2359  Weight: 55.8 kg      History of present illness:  Brittany Kirk is a 76 y.o. female with medical history significant of CAD with 85% ostial lesion that they cant stent without risking LAD, PAF s/p ablation several months ago on Eliquis.   Pt tested COVID+ on Sun.   Pt developed palpitations tonight 1h PTA.  Slight dizziness.  Took cardizem + 4 baby ASA.     ED Course: HR with EMS showed A.flutter with variable block, rate of 130.   Symptoms have since resolved (presumably as cardizem took effect) and HR now 94, A.Fib/flutter based on the EKG.   Trops were 10 and 10.   Covid is unsurprisingly positive.   She is satting 100% on room air and CXR is neg.   Hospital Course:  [facility] hospitalists were consulted to admit the patient. She was given diltiazem as she takes it at home. Cardiology was consulted. The patient converted to NSR within an hour after admission. I discussed the patient with Dr. Curt Bears. He stated that the patient was stable and he had no further recommendations and stated that she was clear for discharge from a cardiological standpoint.   I explained to the patient that due to COVID-19 she is at increased risk to trip over into Atrial fibrillation and that she was to follow the instructions given to her by her cardiologist. If she has a fast heart rate, she is to take 30 mg of  diltiazem. If her tachycardia isn't resolved after three hours, she is to take one more. If it remains high after the second one, she is to come to the ED. She is to come to the ED if at any point she has chest pain, shortness of breath, or dizziness/lightheadedness, she was instructed to come to the ED.    She is discharged to home in fair condition. She is to follow up with her PCP and cardiology as outpatient.   Rhythm at the time of discharge was NSR at 73 bpm.  06/26/21 Patient presents today for hospital discharge follow-up.  Today on exam, she is in atrial fibrillation.  She has an irregularly irregular heart rhythm.  She also is bradycardic with a heart rate between 50 to 55 bpm.  She states at home, she will occasionally have heart rates around 48 bpm.  She questions whether she should stop metoprolol.  She is asymptomatic with this happens.  She denies any syncope or near syncope or chest pain or shortness of breath.  She has a history of coronary artery disease with an ostial lesion of 85%.  Therefore she would be better off to stay on a beta-blocker if possible.  She is also been admitted due to tachycardia.  For those 2 reasons I encouraged the patient to continue metoprolol.  Her heart rate was in the 50s because I feel that  the good outweighs the bad.  She would like to discuss this with her cardiologist.  Otherwise she is doing well.  She still has an occasional cough.  However she denies any chest pain, pleurisy, fevers, chills, hemoptysis.  There is no leg swelling orthopnea or paroxysmal nocturnal dyspnea. Past Medical History:  Diagnosis Date   Acid reflux    Allergic rhinitis    Anxiety    Asymptomatic bilateral carotid artery stenosis    40-59% (02/2018)   Atrial fibrillation ([place]) 07/2019   Breast cancer ([place]) 2015   Right Breast Cancer   CAD (coronary artery disease)    Depression    Emphysema    no longer uses inhalers   Gallstones    nausea, pain upper right abdomen    GERD (gastroesophageal reflux disease)    H/O hiatal hernia    Hiatal hernia    History of skin cancer    Hyperlipidemia    Hypertension    Multinodular goiter (nontoxic)    Scoliosis    Skin cancer    Tobacco user    Wears dentures    top   Past Surgical History:  Procedure Laterality Date   ATRIAL FIBRILLATION ABLATION N/A 04/14/2021   Procedure: ATRIAL FIBRILLATION ABLATION;  Surgeon: Constance Haw, MD;  Location: [facility];  Service: Cardiovascular;  Laterality: N/A;   BREAST LUMPECTOMY Right 2015   CHOLECYSTECTOMY  07/08/2012   Procedure: LAPAROSCOPIC CHOLECYSTECTOMY WITH INTRAOPERATIVE CHOLANGIOGRAM;  Surgeon: Gayland Curry, MD,FACS;  Location: [facility];  Service: General;  Laterality: N/A;  Laparoscopic Cholecystectomy with Intraoperative Cholangiogram   COLONOSCOPY     ESOPHAGOSCOPY N/A 05/29/2017   Procedure: ESOPHAGOSCOPY;  Surgeon: Clarene Essex, MD;  Location: [facility];  Service: Endoscopy;  Laterality: N/A;  botox injection   HAND SURGERY Left    wrist   LEFT HEART CATH AND CORONARY ANGIOGRAPHY N/A 10/11/2017   Procedure: LEFT HEART CATH AND CORONARY ANGIOGRAPHY;  Surgeon: Martinique, Peter M, MD;  Location: [facility];  Service: Cardiovascular;  Laterality: N/A;   TONSILECTOMY, ADENOIDECTOMY, BILATERAL MYRINGOTOMY AND TUBES     TUBAL LIGATION     Current Outpatient Medications on File Prior to Visit  Medication Sig Dispense Refill   acetaminophen (TYLENOL) 500 MG tablet Take 1,000 mg by mouth every 6 (six) hours as needed for moderate pain or headache.     alendronate (FOSAMAX) 70 MG tablet Take 1 tablet (70 mg total) by mouth every 7 (seven) days. Take with a full glass of water on an empty stomach. 4 tablet 11   ascorbic acid (VITAMIN C) 500 MG tablet Take 500 mg by mouth daily.     atorvastatin (LIPITOR) 40 MG tablet TAKE 1 TABLET BY MOUTH EVERYDAY AT BEDTIME 90 tablet 2   Calcium Carbonate (CALCIUM 500 PO) Take 500 mg by mouth daily.      cholecalciferol (VITAMIN D3) 25 MCG (1000 UNIT) tablet Take 1,000 Units by mouth daily.     Cyanocobalamin (B-12) 1000 MCG TBCR Take 1,000 mcg by mouth daily.     diazepam (VALIUM) 5 MG tablet Take 5 mg by mouth daily as needed for anxiety.     diltiazem (CARDIZEM) 30 MG tablet Take 1 table as needed for palpitations. Can take 2nd table after 3 hours if persistent palpitations. 30 tablet 1   ELIQUIS 5 MG TABS tablet TAKE 1 TABLET BY MOUTH TWICE A DAY 180 tablet 1   MAGNESIUM OXIDE PO Take 250 mg by mouth at  bedtime.     metoprolol succinate (TOPROL-XL) 25 MG 24 hr tablet TAKE 1/2 TABLET BY MOUTH EVERY DAY 45 tablet 3   omeprazole (PRILOSEC) 20 MG capsule Take 20 mg by mouth every evening.     spironolactone (ALDACTONE) 25 MG tablet Take 0.5 tablets (12.5 mg total) by mouth daily. 45 tablet 3   traZODone (DESYREL) 50 MG tablet Take 0.5-1 tablets (25-50 mg total) by mouth at bedtime as needed. for sleep 90 tablet 3   zinc gluconate 50 MG tablet Take 50 mg by mouth daily.     No current facility-administered medications on file prior to visit.   Allergies  Allergen Reactions   Penicillins Anaphylaxis, Itching, Swelling and Rash    Has patient had a PCN reaction causing immediate rash, facial/tongue/throat swelling, SOB or lightheadedness with hypotension: Yes Has patient had a PCN reaction causing severe rash involving mucus membranes or skin necrosis: No Has patient had a PCN reaction that required hospitalization: Yes Has patient had a PCN reaction occurring within the last 10 years: No If all of the above answers are "NO", then may proceed with Cephalosporin use.    Levofloxacin Nausea Only and Other (See Comments)    Causes headache, fatigue and dizziness   Cefaclor Itching, Swelling and Rash   Latex Itching and Rash   Metronidazole Itching, Swelling and Rash   Naproxen Itching, Swelling and Rash   Nsaids Itching, Swelling and Rash    Ibuprofen IS tolerated   Septra [Bactrim] Itching,  Swelling and Rash   Sulfonamide Derivatives Itching, Swelling and Rash   Tape Itching and Rash    ONLY USE PAPER TAPE   Social History   Socioeconomic History   Marital status: Married    Spouse name: Psychologist, prison and probation services   Number of children: 1   Years of education: Not on file   Highest education level: Not on file  Occupational History   Occupation: retired truck Orthoptist: RETIRED  Tobacco Use   Smoking status: Former    Packs/day: 0.50    Years: 46.00    Pack years: 23.00    Types: Cigarettes    Quit date: 02/13/2011    Years since quitting: 10.3   Smokeless tobacco: Never  Vaping Use   Vaping Use: Never used  Substance and Sexual Activity   Alcohol use: No   Drug use: No   Sexual activity: Not on file  Other Topics Concern   Not on file  Social History Narrative   Married since 1991, second marriage.    1 son   1 grandson   1 granddaughter   2 step children   3 step grandchildren   Social Determinants of Radio broadcast assistant Strain: Low Risk    Difficulty of Paying Living Expenses: Not hard at all  Food Insecurity: No Food Insecurity   Worried About Charity fundraiser in the Last Year: Never true   Arboriculturist in the Last Year: Never true  Transportation Needs: No Transportation Needs   Lack of Transportation (Medical): No   Lack of Transportation (Non-Medical): No  Physical Activity: Insufficiently Active   Days of Exercise per Week: 3 days   Minutes of Exercise per Session: 20 min  Stress: Stress Concern Present   Feeling of Stress : To some extent  Social Connections: Socially Integrated   Frequency of Communication with Friends and Family: More than three times a week   Frequency of Social Gatherings  with Friends and Family: More than three times a week   Attends Religious Services: More than 4 times per year   Active Member of Clubs or Organizations: Yes   Attends Music therapist: More than 4 times per year   Marital  Status: Married  Human resources officer Violence: Not At Risk   Fear of Current or Ex-Partner: No   Emotionally Abused: No   Physically Abused: No   Sexually Abused: No     Review of Systems  All other systems reviewed and are negative.     Objective:   Physical Exam Vitals reviewed.  Constitutional:      General: She is not in acute distress.    Appearance: She is well-developed. She is not diaphoretic.  HENT:     Head: Normocephalic and atraumatic.     Right Ear: External ear normal. Drainage present.     Left Ear: External ear normal. Decreased hearing noted. Drainage and swelling present.     Mouth/Throat:     Pharynx: No oropharyngeal exudate.  Eyes:     Conjunctiva/sclera: Conjunctivae normal.     Pupils: Pupils are equal, round, and reactive to light.  Neck:     Thyroid: No thyromegaly.     Vascular: No JVD.  Cardiovascular:     Rate and Rhythm: Normal rate. Rhythm irregular.     Heart sounds: Normal heart sounds. No murmur heard.   No friction rub. No gallop.  Pulmonary:     Effort: Pulmonary effort is normal. No respiratory distress.     Breath sounds: Normal breath sounds. No stridor. No wheezing or rales.  Abdominal:     General: Bowel sounds are normal. There is no distension.     Palpations: Abdomen is soft.     Tenderness: There is no abdominal tenderness. There is no guarding.  Musculoskeletal:     Cervical back: Normal range of motion.     Lumbar back: Spasms, tenderness and bony tenderness present. Decreased range of motion. Negative right straight leg raise test and negative left straight leg raise test.     Right lower leg: No edema.     Left lower leg: No edema.  Lymphadenopathy:     Cervical: No cervical adenopathy.  Neurological:     Mental Status: She is alert and oriented to person, place, and time.     Cranial Nerves: No cranial nerve deficit.     Sensory: No sensory deficit.     Motor: Tremor present. No atrophy, abnormal muscle tone or seizure  activity.     Deep Tendon Reflexes: Reflexes are normal and symmetric.          Assessment & Plan:  Paroxysmal atrial fibrillation ([place])  COVID-19 virus infection Patient is in atrial fibrillation at the present time however her heart rate is controlled.  She is slightly bradycardic.  However I recommended that she continue metoprolol due to her known coronary artery disease and her history of hospital admission secondary to tachycardia due to A. fib.  I explained to the patient that heart rates in the 50s are not dangerous as long as she is asymptomatic.  I have advised her to call us immediately if she sees heart rates in the low 40s as this will be low enough that I would discontinue metoprolol.  Continue to use Cardizem only as needed.  Patient has recovered from [place].  Lab work on January 7 was normal so I see no reason to repeat that at the  present time.

## 2021-06-27 DIAGNOSIS — L723 Sebaceous cyst: Secondary | ICD-10-CM | POA: Diagnosis not present

## 2021-06-29 DIAGNOSIS — H838X3 Other specified diseases of inner ear, bilateral: Secondary | ICD-10-CM | POA: Diagnosis not present

## 2021-06-29 DIAGNOSIS — H903 Sensorineural hearing loss, bilateral: Secondary | ICD-10-CM | POA: Diagnosis not present

## 2021-07-13 ENCOUNTER — Telehealth: Payer: Self-pay | Admitting: Cardiovascular Disease

## 2021-07-13 NOTE — Telephone Encounter (Signed)
STAT if HR is under 50 or over 120 (normal HR is 60-100 beats per minute)  What is your heart rate?  40  Do you have a log of your heart rate readings (document readings)?  2/1: 158/62 50         116/55 51         170/75 38         152/53 37   164/62 40   172/59 48  2/2: 160/65 55         119/51 40 Do you have any other symptoms?  Dizziness, SOB

## 2021-07-13 NOTE — Telephone Encounter (Signed)
Spoke with pt, she reports dizziness with standing from sitting and when walking around the house. When she checks her blood pressure her pulse will not register and her daughter, who is a Therapist, sports has shown her husband how to check the pulse manually. She reports dizziness while on the phone with me and her bp was 166/58 and manuel pulse of 38 by her report. Her husband reports her pulse is regular just slow. Discussed with Dr Artis Delay), patient was advised to take her regular metoprolol dose this morning to help with her PVC burden. She has a follow up appointment with dr Curt Bears on 08/08/21. Will forward this message to him to make him aware and if any other recommendations.

## 2021-07-13 NOTE — Telephone Encounter (Signed)
Call transferred to [facility], [place].

## 2021-07-19 ENCOUNTER — Encounter: Payer: Self-pay | Admitting: Cardiovascular Disease

## 2021-08-08 ENCOUNTER — Other Ambulatory Visit: Payer: Self-pay

## 2021-08-08 ENCOUNTER — Encounter: Payer: Self-pay | Admitting: Cardiology

## 2021-08-08 ENCOUNTER — Encounter: Payer: Self-pay | Admitting: Cardiovascular Disease

## 2021-08-08 ENCOUNTER — Ambulatory Visit (INDEPENDENT_AMBULATORY_CARE_PROVIDER_SITE_OTHER): Payer: Medicare Other | Admitting: Cardiology

## 2021-08-08 VITALS — BP 150/62 | HR 58 | Ht 59.0 in | Wt 121.0 lb

## 2021-08-08 DIAGNOSIS — I493 Ventricular premature depolarization: Secondary | ICD-10-CM | POA: Diagnosis not present

## 2021-08-08 DIAGNOSIS — I4819 Other persistent atrial fibrillation: Secondary | ICD-10-CM

## 2021-08-08 MED ORDER — MEXILETINE HCL 250 MG PO CAPS: 250.0000 mg | ORAL_CAPSULE | Freq: Two times a day (BID) | ORAL | 3 refills | Status: DC

## 2021-08-08 MED ORDER — FUROSEMIDE 20 MG PO TABS: 20.0000 mg | ORAL_TABLET | Freq: Every day | ORAL | 0 refills | Status: DC

## 2021-08-08 NOTE — Patient Instructions (Addendum)
Medication Instructions:  Your physician has recommended you make the following change in your medication:  START Mexiletine 250 mg twice daily TAKE Lasix 20 mg once daily for the next 2 days  *If you need a refill on your cardiac medications before your next appointment, please call your pharmacy*   Lab Work: None ordered   Testing/Procedures: None ordered   Follow-Up: At [facility], you and your health needs are our priority.  As part of our continuing mission to provide you with exceptional heart care, we have created designated Provider Care Teams.  These Care Teams include your primary Cardiologist (physician) and Advanced Practice Providers (APPs -  Physician Assistants and Nurse Practitioners) who all work together to provide you with the care you need, when you need it.  Your next appointment:   3 month(s)  The format for your next appointment:   In Person  Provider:   Allegra Lai, MD    Thank you for choosing [facility]!!   Trinidad Curet, RN [phone]   Other Instructions     Mexiletine capsules What is this medication? MEXILETINE (mex [place] e teen) is an antiarrhythmic agent. This medicine is used to treat irregular heart rhythm and can slow rapid heartbeats. It can help your heart to return to and maintain a normal rhythm. Because of the side effects caused by this medicine, it is usually used for heartbeat problems that may be life-threatening. This medicine may be used for other purposes; ask your health care provider or pharmacist if you have questions. COMMON BRAND NAME(S): Mexitil What should I tell my care team before I take this medication? They need to know if you have any of these conditions: liver disease other heart problems previous heart attack an unusual or allergic reaction to mexiletine, other medicines, foods, dyes, or preservatives pregnant or trying to get pregnant breast-feeding How should I use this medication? Take this  medicine by mouth with a glass of water. Follow the directions on the prescription label. It is recommended that you take this medicine with food or an antacid. Take your doses at regular intervals. Do not take your medicine more often than directed. Do not stop taking except on the advice of your doctor or health care professional. Talk to your pediatrician regarding the use of this medicine in children. Special care may be needed. Overdosage: If you think you have taken too much of this medicine contact a poison control center or emergency room at once. NOTE: This medicine is only for you. Do not share this medicine with others. What if I miss a dose? If you miss a dose, take it as soon as you can. If it is almost time for your next dose, take only that dose. Do not take double or extra doses. What may interact with this medication? Do not take this medicine with any of the following medications: dofetilide This medicine may also interact with the following medications: caffeine cimetidine medicines for depression, anxiety, or psychotic disturbances medicines to control heart rhythm phenobarbital phenytoin rifampin theophylline This list may not describe all possible interactions. Give your health care provider a list of all the medicines, herbs, non-prescription drugs, or dietary supplements you use. Also tell them if you smoke, drink alcohol, or use illegal drugs. Some items may interact with your medicine. What should I watch for while using this medication? Your condition will be monitored closely when you first begin therapy. Often, this drug is first started in a hospital or other  monitored health care setting. Once you are on maintenance therapy, visit your doctor or health care provider for regular checks on your progress. Because your condition and use of this medicine carry some risk, it is a good idea to carry an identification card, necklace or bracelet with details of your  condition, medications, and doctor or health care provider. You may get drowsy or dizzy. Do not drive, use machinery, or do anything that needs mental alertness until you know how this medicine affects you. Do not stand or sit up quickly, especially if you are an older patient. This reduces the risk of dizzy or fainting spells. Alcohol can make you more dizzy, increase flushing and rapid heartbeats. Avoid alcoholic drinks. This medicine may cause serious skin reactions. They can happen weeks to months after starting the medicine. Contact your health care provider right away if you notice fevers or flu-like symptoms with a rash. The rash may be red or purple and then turn into blisters or peeling of the skin. Or, you might notice a red rash with swelling of the face, lips or lymph nodes in your neck or under your arms. What side effects may I notice from receiving this medication? Side effects that you should report to your doctor or health care professional as soon as possible: allergic reactions like skin rash, itching or hives, swelling of the face, lips, or tongue breathing problems chest pain, continued irregular heartbeats rash, fever, and swollen lymph nodes redness, blistering, peeling or loosening of the skin, including inside the mouth seizures skin rash trembling, shaking unusual bleeding or bruising unusually weak or tired Side effects that usually do not require medical attention (report to your doctor or health care professional if they continue or are bothersome): blurred vision difficulty walking heartburn nausea, vomiting nervousness numbness, or tingling in the fingers or toes This list may not describe all possible side effects. Call your doctor for medical advice about side effects. You may report side effects to FDA at 1-800-FDA-1088. Where should I keep my medication? Keep out of reach of children. Store at room temperature between 15 and 30 degrees C (59 and 86 degrees  F). Throw away any unused medicine after the expiration date. NOTE: This sheet is a summary. It may not cover all possible information. If you have questions about this medicine, talk to your doctor, pharmacist, or health care provider.  2022 Elsevier/Gold Standard (2018-09-04 00:00:00)

## 2021-08-08 NOTE — Progress Notes (Signed)
Electrophysiology Office Note   Date:  08/08/2021   ID:  Brittany Kirk, DOB [birth year], MRN [id number]  PCP:  Susy Frizzle, MD  Cardiologist:  Gwenlyn Found Primary Electrophysiologist:  Sylar Voong Meredith Leeds, MD    Chief Complaint: AF   History of Present Illness: Brittany Kirk is a 76 y.o. female who is being seen today for the evaluation of AF at the request of Susy Frizzle, MD. Presenting today for electrophysiology evaluation.  She has a history significant for coronary artery disease, paroxysmal atrial fibrillation, hypertension, hyperlipidemia, carotid artery disease, breast cancer status postlumpectomy in 2015.  Left heart catheterization 2019 showed single-vessel obstructive disease in the ramus branch.  Medical therapy was recommended.  She presented to the hospital August 2022 with atrial fibrillation.  She is now status post atrial fibrillation ablation 04/14/2021.  She was hospitalized January 2023 with COVID-pneumonia and atrial fibrillation.  She has since converted back to normal rhythm.  Today, denies symptoms of palpitations, chest pain, shortness of breath, orthopnea, PND, lower extremity edema, claudication, dizziness, presyncope, syncope, bleeding, or neurologic sequela. The patient is tolerating medications without difficulties.  Since her ablation she has had 1 episode of atrial fibrillation.  Her atrial fibrillation is not her biggest complaint.  Her main issue is PVCs.  She feels PVCs most of the day.  They make her feel weak and fatigued.  She also feels that she has some lower extremity edema and some lower leg pain.   Past Medical History:  Diagnosis Date   Acid reflux    Allergic rhinitis    Anxiety    Asymptomatic bilateral carotid artery stenosis    40-59% (02/2018)   Atrial fibrillation ([place]) 07/2019   Breast cancer ([place]) 2015   Right Breast Cancer   CAD (coronary artery disease)    Depression    Emphysema    no longer uses inhalers    Gallstones    nausea, pain upper right abdomen   GERD (gastroesophageal reflux disease)    H/O hiatal hernia    Hiatal hernia    History of skin cancer    Hyperlipidemia    Hypertension    Multinodular goiter (nontoxic)    Scoliosis    Skin cancer    Tobacco user    Wears dentures    top   Past Surgical History:  Procedure Laterality Date   ATRIAL FIBRILLATION ABLATION N/A 04/14/2021   Procedure: ATRIAL FIBRILLATION ABLATION;  Surgeon: Constance Haw, MD;  Location: [facility];  Service: Cardiovascular;  Laterality: N/A;   BREAST LUMPECTOMY Right 2015   CHOLECYSTECTOMY  07/08/2012   Procedure: LAPAROSCOPIC CHOLECYSTECTOMY WITH INTRAOPERATIVE CHOLANGIOGRAM;  Surgeon: Gayland Curry, MD,FACS;  Location: [facility];  Service: General;  Laterality: N/A;  Laparoscopic Cholecystectomy with Intraoperative Cholangiogram   COLONOSCOPY     ESOPHAGOSCOPY N/A 05/29/2017   Procedure: ESOPHAGOSCOPY;  Surgeon: Clarene Essex, MD;  Location: [facility];  Service: Endoscopy;  Laterality: N/A;  botox injection   HAND SURGERY Left    wrist   LEFT HEART CATH AND CORONARY ANGIOGRAPHY N/A 10/11/2017   Procedure: LEFT HEART CATH AND CORONARY ANGIOGRAPHY;  Surgeon: Martinique, Peter M, MD;  Location: [facility];  Service: Cardiovascular;  Laterality: N/A;   TONSILECTOMY, ADENOIDECTOMY, BILATERAL MYRINGOTOMY AND TUBES     TUBAL LIGATION       Current Outpatient Medications  Medication Sig Dispense Refill   acetaminophen (TYLENOL) 500 MG tablet Take 1,000 mg by mouth every 6 (  six) hours as needed for moderate pain or headache.     alendronate (FOSAMAX) 70 MG tablet Take 1 tablet (70 mg total) by mouth every 7 (seven) days. Take with a full glass of water on an empty stomach. 4 tablet 11   ascorbic acid (VITAMIN C) 500 MG tablet Take 500 mg by mouth daily.     atorvastatin (LIPITOR) 40 MG tablet TAKE 1 TABLET BY MOUTH EVERYDAY AT BEDTIME 90 tablet 2   Calcium Carbonate (CALCIUM 500 PO) Take 500  mg by mouth daily.     cholecalciferol (VITAMIN D3) 25 MCG (1000 UNIT) tablet Take 1,000 Units by mouth daily.     Cyanocobalamin (B-12) 1000 MCG TBCR Take 1,000 mcg by mouth daily.     diazepam (VALIUM) 5 MG tablet Take 5 mg by mouth daily as needed for anxiety.     diltiazem (CARDIZEM) 30 MG tablet Take 1 table as needed for palpitations. Can take 2nd table after 3 hours if persistent palpitations. 30 tablet 1   ELIQUIS 5 MG TABS tablet TAKE 1 TABLET BY MOUTH TWICE A DAY 180 tablet 1   furosemide (LASIX) 20 MG tablet Take 1 tablet (20 mg total) by mouth daily. For the next 2 days 2 tablet 0   metoprolol succinate (TOPROL-XL) 25 MG 24 hr tablet TAKE 1/2 TABLET BY MOUTH EVERY DAY 45 tablet 3   mexiletine (MEXITIL) 250 MG capsule Take 1 capsule (250 mg total) by mouth 2 (two) times daily. 60 capsule 3   omeprazole (PRILOSEC) 20 MG capsule Take 20 mg by mouth every evening.     traZODone (DESYREL) 50 MG tablet Take 0.5-1 tablets (25-50 mg total) by mouth at bedtime as needed. for sleep 90 tablet 3   zinc gluconate 50 MG tablet Take 50 mg by mouth daily.     No current facility-administered medications for this visit.    Allergies:   Penicillins, Levofloxacin, Cefaclor, Latex, Metronidazole, Naproxen, Nsaids, Septra [bactrim], Sulfonamide derivatives, and Tape   Social History:  The patient  reports that she quit smoking about 10 years ago. Her smoking use included cigarettes. She has a 23.00 pack-year smoking history. She has never used smokeless tobacco. She reports that she does not drink alcohol and does not use drugs.   Family History:  The patient's family history includes Aplastic anemia in an other family member; Arthritis in her mother; Diverticulitis in her sister; Heart disease in her sister and another family member; Pancreatic cancer in her mother.    ROS:  Please see the history of present illness.   Otherwise, review of systems is positive for none.   All other systems are reviewed  and negative.   PHYSICAL EXAM: VS:  BP (!) 150/62    Pulse (!) 58    Ht 4\' 11"  (1.499 m)    Wt 121 lb (54.9 kg)    SpO2 92%    BMI 24.44 kg/m  , BMI Body mass index is 24.44 kg/m. GEN: Well nourished, well developed, in no acute distress  HEENT: normal  Neck: no JVD, carotid bruits, or masses Cardiac: RRR; no murmurs, rubs, or gallops, trace edema  Respiratory:  clear to auscultation bilaterally, normal work of breathing GI: soft, nontender, nondistended, + BS MS: no deformity or atrophy  Skin: warm and dry Neuro:  Strength and sensation are intact Psych: euthymic mood, full affect  EKG:  EKG is ordered today. Personal review of the ekg ordered shows, rate 58, PVCs  Recent Labs: 01/13/2021:  TSH 0.489 06/17/2021: B Natriuretic Peptide 372.1; BUN 19; Creatinine, Ser 0.91; Hemoglobin 12.3; Platelets 245; Potassium 3.5; Sodium 136    Lipid Panel     Component Value Date/Time   CHOL 133 07/15/2019 0934   CHOL 124 02/27/2017 0940   TRIG 48 07/15/2019 0934   HDL 59 07/15/2019 0934   HDL 58 02/27/2017 0940   CHOLHDL 2.3 07/15/2019 0934   VLDL 10 07/15/2019 0934   LDLCALC 64 07/15/2019 0934   LDLCALC 61 07/14/2018 1149     Wt Readings from Last 3 Encounters:  08/08/21 121 lb (54.9 kg)  06/26/21 130 lb (59 kg)  06/16/21 123 lb (55.8 kg)      Other studies Reviewed: Additional studies/ records that were reviewed today include: Cardiac monitor 05/19/2021 personally reviewed Review of the above records today demonstrates:  1. SR/SB/ST 2. Freq PVCs often in a Bi/Tri geminal pattern  TTE 05/17/2021 personally reviewed  1. Left ventricular ejection fraction, by estimation, is 55 to 60%. The  left ventricle has normal function. The left ventricle has no regional  wall motion abnormalities. There is mild asymmetric left ventricular  hypertrophy of the basal-septal segment.  Left ventricular diastolic parameters were normal.   2. Right ventricular systolic function is normal. The  right ventricular  size is normal. There is mildly elevated pulmonary artery systolic  pressure. The estimated right ventricular systolic pressure is 94.7 mmHg.   3. The mitral valve is normal in structure. Mild mitral valve  regurgitation.   4. The aortic valve is tricuspid. There is mild calcification of the  aortic valve. There is mild thickening of the aortic valve. Aortic valve  regurgitation is trivial.   5. The inferior vena cava is normal in size with greater than 50%  respiratory variability, suggesting right atrial pressure of 3 mmHg.   ASSESSMENT AND PLAN:  1.  Paroxysmal atrial fibrillation: Currently on Eliquis 5 mg twice daily.  CHA2DS2-VASc of 5.  She is now status post atrial fibrillation ablation 04/14/2021.  She has had minimal episodes of atrial fibrillation.  Overall happy with her control.  2.  Coronary artery disease: Disease in the ramus branch.  Continue medical management per primary cardiology.  3.  Hypertension: Elevated today.  He is usually well controlled.  No changes.  4.  PVCs: Elevated burden at 17% on the most recent cardiac monitor.  She is having PVCs today and is symptomatic.  We Merridith Dershem start to 250 mg of mexiletine twice daily.  5.  Lower extremity edema: Trace edema.  She also has some leg pain.  We Jaydin Boniface give her Lasix 20 mg to take for the next 2 days.  If leg pain continues she Lumina Gitto follow-up with her primary physician.  Current medicines are reviewed at length with the patient today.   The patient does not have concerns regarding her medicines.  The following changes were made today: Start mexiletine, Lasix  Labs/ tests ordered today include:  Orders Placed This Encounter  Procedures   EKG 12-Lead     Disposition:   FU with Mariaguadalupe Fialkowski 3 months  Signed, Cam Dauphin Meredith Leeds, MD  08/08/2021 3:50 PM     [facility] [address] [place] [address] [phone] (office) [phone] (fax)

## 2021-08-09 ENCOUNTER — Encounter: Payer: Self-pay | Admitting: Cardiology

## 2021-08-10 ENCOUNTER — Telehealth: Payer: Self-pay

## 2021-08-10 NOTE — Telephone Encounter (Signed)
**Note De-Identified Lea Walbert Obfuscation** Aviva Kluver Key: [id number] - PA Case ID: [id number] ?Outcome: Approved today ?Coverage Starts on: 06/11/2021 12:00:00 AM, Coverage Ends on: 06/10/2022 12:00:00 AM.  ?Drug: Mexiletine HCl 250MG  capsules ?Form: Administrator, sports PA Form ? ?I have notified [id number] - [place], [place] - [address] (Ph: [phone]) of this approval. ?

## 2021-08-10 NOTE — Telephone Encounter (Signed)
**Note De-Identified Brittany Kirk Obfuscation** I started a non-formulary Mexiletine PA through covermymeds. ?Key: [id number] ?

## 2021-08-11 MED ORDER — MEXILETINE HCL 250 MG PO CAPS: 250.0000 mg | ORAL_CAPSULE | Freq: Two times a day (BID) | ORAL | 3 refills | Status: DC

## 2021-08-11 NOTE — Telephone Encounter (Signed)
Discussed in further detail with pt. ?Aware insurance will cover, and PA is completed/approved. ?Educated that per Up To Date there are no interactions with the medications she lists.  She tells me she got that information off the web. ? ?Pt is willing to try it after discussion. ?She will call if SE occur after initiation. ?New Rx sent to pharmacy ?

## 2021-08-23 ENCOUNTER — Telehealth: Payer: Self-pay | Admitting: Cardiology

## 2021-08-23 NOTE — Telephone Encounter (Signed)
Called patient about message. Patient had been taking Mexiletine for 1 1/2 weeks.  Patient stated she is having hard time taking this medication. Patient complaining that it hurts to swallow, heart burn, feeling shaky, headaches, can't sleep. Will send message to pharm D to see if patient can stop medication or does she need to taper off medication. Will send message to Dr. Curt Bears to let him know patient cannot tolerate medications and see he had an alternative medication she can take.  ?

## 2021-08-23 NOTE — Telephone Encounter (Signed)
Called patient with advisement of [name] the pharm D. Patient verbalized understanding. ?

## 2021-08-23 NOTE — Telephone Encounter (Signed)
Recommend she discontinue mexiletine, especially in light of side effects she is experiencing.  Since it is a twice daily medication we can see if she notices any difference tomorrow morning. ?

## 2021-08-23 NOTE — Telephone Encounter (Signed)
Pt c/o medication issue: ? ?1. Name of Medication: Mexiletine- says she still having Arrythmiata ? ?2. How are you currently taking this medication (dosage and times per day)?  1 tablet 2 times a day ? ?3. Are you having a reaction (difficulty breathing--STAT)?  ? ?4. What is your medication issue? Shaky, head even shakes for about an hour or 2 every day, unbearable headaches, can not sleep, hurt to swallow,  burning sensation from her Esophagus to her chest, chest is now hurting , feels like it have been burned with acid  ? ?

## 2021-08-24 NOTE — Telephone Encounter (Signed)
Called patient back to see how she is going today. Patient did not take her night time dose or her morning dose of Mexiletine. Patient stated her throat feels better, but she is still have the palpitations. Patient's BP 140/83 and HR 46. Patient stated her low HR is nothing new. Patient stated the mexiletine did not help with her palpitations. Will forward to Dr. Curt Bears and his nurse for further advisement. ?

## 2021-08-24 NOTE — Telephone Encounter (Signed)
Patient was calling back in. Please advuse ?

## 2021-08-28 DIAGNOSIS — Z20822 Contact with and (suspected) exposure to covid-19: Secondary | ICD-10-CM | POA: Diagnosis not present

## 2021-08-29 ENCOUNTER — Ambulatory Visit: Payer: Medicare Other | Admitting: Cardiovascular Disease

## 2021-08-31 ENCOUNTER — Other Ambulatory Visit: Payer: Self-pay

## 2021-08-31 ENCOUNTER — Encounter: Payer: Self-pay | Admitting: Cardiovascular Disease

## 2021-08-31 ENCOUNTER — Ambulatory Visit (INDEPENDENT_AMBULATORY_CARE_PROVIDER_SITE_OTHER): Payer: Medicare Other | Admitting: Cardiovascular Disease

## 2021-08-31 DIAGNOSIS — I6523 Occlusion and stenosis of bilateral carotid arteries: Secondary | ICD-10-CM

## 2021-08-31 DIAGNOSIS — G250 Essential tremor: Secondary | ICD-10-CM | POA: Diagnosis not present

## 2021-08-31 DIAGNOSIS — I2510 Atherosclerotic heart disease of native coronary artery without angina pectoris: Secondary | ICD-10-CM

## 2021-08-31 DIAGNOSIS — I4891 Unspecified atrial fibrillation: Secondary | ICD-10-CM | POA: Diagnosis not present

## 2021-08-31 DIAGNOSIS — I493 Ventricular premature depolarization: Secondary | ICD-10-CM | POA: Diagnosis not present

## 2021-08-31 DIAGNOSIS — I10 Essential (primary) hypertension: Secondary | ICD-10-CM | POA: Diagnosis not present

## 2021-08-31 DIAGNOSIS — E782 Mixed hyperlipidemia: Secondary | ICD-10-CM | POA: Diagnosis not present

## 2021-08-31 NOTE — Assessment & Plan Note (Signed)
History of moderate bilateral ICA stenosis by duplex ultrasound 02/28/2021.  This will be repeated on an annual basis. ?

## 2021-08-31 NOTE — Assessment & Plan Note (Signed)
History of atrial fibrillation in the past status post A-fib ablation by Dr. Curt Bears 04/14/2021 maintaining sinus rhythm on beta-blocker and Eliquis. ?

## 2021-08-31 NOTE — Assessment & Plan Note (Signed)
High PVC burden which she is symptomatic from.  Often enough by and trigeminal pattern.  She saw Dr. Curt Bears 08/08/2021 who began her on mexiletine which unfortunately has exacerbated her baseline tremor.  I am going to stop this medication and refer her back to Dr. Curt Bears for further evaluation and alternative treatments. ?

## 2021-08-31 NOTE — Assessment & Plan Note (Signed)
History of mild CAD by cath performed by Dr. Martinique 5/19 with moderately severe ramus branch ostial disease and noncritical disease otherwise unchanged from prior cath performed Dr. Lia Foyer in 2010.  Her chest pain was thought to be noncardiac.  Medical therapy was recommended. ?

## 2021-08-31 NOTE — Progress Notes (Signed)
? ? ? ?08/31/2021 ?Brittany Kirk   ?[birth year]  ?[id number] ? ?Primary Physician Susy Frizzle, MD ?Primary Cardiologist: Lorretta Harp MD [name], [place] ? ?HPI:  Brittany Kirk is a 76 y.o.  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 She was referred by Dr. Dennard Schaumann for cardiovascular evaluation because of chest pain. I last saw her in the office 04/25/2021.  Risk factors include 25 pack years of tobacco abuse having quit 3 years ago. She has treated hypertension as well as a strong family history of heart disease with multiple siblings who died at a premature age of myocardial infarction's. She has never had a heart attack or stroke. She has had a remote cardiac catheterization by Dr. Lia Foyer but no intervention was performed. She was complaining of some substernal chest pain with chronic nausea. This has since some sided after beginning on a proton pump inhibitor. She had a negative stress test 01/24/16 and again during a recent consultation for chest pain 01/05/17. She has had upper and lower endoscopy for abdominal pain and nausea which was unrevealing. ?  ?She had diagnostic coronary angiography performed by Dr. Martinique 5/19 showing moderately severe ramus branch ostial disease with noncritical disease otherwise a normal LV function.  This was unchanged from prior cath performed in 2010 it was thought that her chest pain was noncardiac in nature. ?  ?She was seen in the emergency room on 07/15/2019 for A. fib with RVR.  She converted spontaneously to sinus rhythm and was begun on apixaban by Dr. Harrell Gave.  She has had a few episodes of brief AF since but for the most part is asymptomatic from this.   ?She was admitted in early August with A. fib with RVR and converted.  Her troponins were mildly elevated.  Her 2D echo was normal.  She has had no recurrent episodes.  She she saw Dr.  Curt Bears for EP follow-up who has scheduled her for A. fib ablation on 04/14/2021. ? ?Since I saw her in the office 5 months ago she did see Dr. Curt Bears because of symptomatic PVCs who began her on mexiletine.  Unfortunately, this is exacerbated her baseline tremor.  Otherwise she has remained stable.  She also complains of reflux type symptoms on Prilosec.  She still sees Dr. Lizbeth Bark  a gastroenterologist for this. ?  ? ? ?Current Meds  ?Medication Sig  ? acetaminophen (TYLENOL) 500 MG tablet Take 1,000 mg by mouth every 6 (six) hours as needed for moderate pain or headache.  ? alendronate (FOSAMAX) 70 MG tablet Take 1 tablet (70 mg total) by mouth every 7 (seven) days. Take with a full glass of water on an empty stomach.  ? ascorbic acid (VITAMIN C) 500 MG tablet Take 500 mg by mouth daily.  ? atorvastatin (LIPITOR) 40 MG tablet TAKE 1 TABLET BY MOUTH EVERYDAY AT BEDTIME  ? cholecalciferol (VITAMIN D3) 25 MCG (1000 UNIT) tablet Take 1,000 Units by mouth daily.  ? Cyanocobalamin (B-12) 1000 MCG TBCR Take 1,000 mcg by mouth daily.  ? diazepam (VALIUM) 5 MG tablet Take 5 mg by mouth daily as needed for anxiety.  ? diltiazem (CARDIZEM) 30 MG tablet Take 1 table as needed for palpitations. Can take 2nd table after 3 hours if persistent palpitations.  ? ELIQUIS 5 MG TABS tablet TAKE 1 TABLET BY MOUTH TWICE A DAY  ? furosemide (LASIX)  20 MG tablet Take 1 tablet (20 mg total) by mouth daily. For the next 2 days  ? metoprolol succinate (TOPROL-XL) 25 MG 24 hr tablet TAKE 1/2 TABLET BY MOUTH EVERY DAY  ? omeprazole (PRILOSEC) 20 MG capsule Take 20 mg by mouth every evening.  ? traZODone (DESYREL) 50 MG tablet Take 0.5-1 tablets (25-50 mg total) by mouth at bedtime as needed. for sleep  ? zinc gluconate 50 MG tablet Take 50 mg by mouth daily.  ? [DISCONTINUED] mexiletine (MEXITIL) 250 MG capsule Take 1 capsule (250 mg total) by mouth 2 (two) times daily.  ?  ? ?Allergies  ?Allergen Reactions  ? Penicillins Anaphylaxis,  Itching, Swelling and Rash  ?  Has patient had a PCN reaction causing immediate rash, facial/tongue/throat swelling, SOB or lightheadedness with hypotension: Yes ?Has patient had a PCN reaction causing severe rash involving mucus membranes or skin necrosis: No ?Has patient had a PCN reaction that required hospitalization: Yes ?Has patient had a PCN reaction occurring within the last 10 years: No ?If all of the above answers are "NO", then may proceed with Cephalosporin use. ?  ? Levofloxacin Nausea Only and Other (See Comments)  ?  Causes headache, fatigue and dizziness  ? Cefaclor Itching, Swelling and Rash  ? Latex Itching and Rash  ? Metronidazole Itching, Swelling and Rash  ? Naproxen Itching, Swelling and Rash  ? Nsaids Itching, Swelling and Rash  ?  Ibuprofen IS tolerated  ? Septra [Bactrim] Itching, Swelling and Rash  ? Sulfonamide Derivatives Itching, Swelling and Rash  ? Tape Itching and Rash  ?  ONLY USE PAPER TAPE  ? ? ?Social History  ? ?Socioeconomic History  ? Marital status: Married  ?  Spouse name: Harrie Jeans  ? Number of children: 1  ? Years of education: Not on file  ? Highest education level: Not on file  ?Occupational History  ? Occupation: retired Environmental consultant  ?  Employer: RETIRED  ?Tobacco Use  ? Smoking status: Former  ?  Packs/day: 0.50  ?  Years: 46.00  ?  Pack years: 23.00  ?  Types: Cigarettes  ?  Quit date: 02/13/2011  ?  Years since quitting: 10.5  ? Smokeless tobacco: Never  ?Vaping Use  ? Vaping Use: Never used  ?Substance and Sexual Activity  ? Alcohol use: No  ? Drug use: No  ? Sexual activity: Not on file  ?Other Topics Concern  ? Not on file  ?Social History Narrative  ? Married since 1991, second marriage.   ? 1 son  ? 1 grandson  ? 1 granddaughter  ? 2 step children  ? 3 step grandchildren  ? ?Social Determinants of Health  ? ?Financial Resource Strain: Low Risk   ? Difficulty of Paying Living Expenses: Not hard at all  ?Food Insecurity: No Food Insecurity  ? Worried About  Charity fundraiser in the Last Year: Never true  ? Ran Out of Food in the Last Year: Never true  ?Transportation Needs: No Transportation Needs  ? Lack of Transportation (Medical): No  ? Lack of Transportation (Non-Medical): No  ?Physical Activity: Insufficiently Active  ? Days of Exercise per Week: 3 days  ? Minutes of Exercise per Session: 20 min  ?Stress: Stress Concern Present  ? Feeling of Stress : To some extent  ?Social Connections: Socially Integrated  ? Frequency of Communication with Friends and Family: More than three times a week  ? Frequency of Social Gatherings with Friends  and Family: More than three times a week  ? Attends Religious Services: More than 4 times per year  ? Active Member of Clubs or Organizations: Yes  ? Attends Archivist Meetings: More than 4 times per year  ? Marital Status: Married  ?Intimate Partner Violence: Not At Risk  ? Fear of Current or Ex-Partner: No  ? Emotionally Abused: No  ? Physically Abused: No  ? Sexually Abused: No  ?  ? ?Review of Systems: ?General: negative for chills, fever, night sweats or weight changes.  ?Cardiovascular: negative for chest pain, dyspnea on exertion, edema, orthopnea, palpitations, paroxysmal nocturnal dyspnea or shortness of breath ?Dermatological: negative for rash ?Respiratory: negative for cough or wheezing ?Urologic: negative for hematuria ?Abdominal: negative for nausea, vomiting, diarrhea, bright red blood per rectum, melena, or hematemesis ?Neurologic: negative for visual changes, syncope, or dizziness ?All other systems reviewed and are otherwise negative except as noted above. ? ? ? ?Blood pressure (!) 160/88, pulse 60, height '4\' 11"'$  (1.499 m), weight 118 lb 3.2 oz (53.6 kg), SpO2 97 %.  ?General appearance: alert and no distress ?Neck: no adenopathy, no carotid bruit, no JVD, supple, symmetrical, trachea midline, and thyroid not enlarged, symmetric, no tenderness/mass/nodules ?Lungs: clear to auscultation  bilaterally ?Heart: regular rate and rhythm, S1, S2 normal, no murmur, click, rub or gallop and frequent extrasystoles ?Extremities: extremities normal, atraumatic, no cyanosis or edema ?Pulses: 2+ and symmetric ?Skin: Skin co

## 2021-08-31 NOTE — Assessment & Plan Note (Signed)
History of hyperlipidemia on statin therapy with lipid profile performed 07/15/2019 revealing total cholesterol 133, LDL 64 and HDL 59. ?

## 2021-08-31 NOTE — Assessment & Plan Note (Signed)
History of essential tremor which is gotten worse since starting mexiletine.  She still has frequent PVCs.  I am going to stop the mexiletine and refer her back to Dr. Curt Bears for alternative medications. ?

## 2021-08-31 NOTE — Telephone Encounter (Signed)
Pt has appt with Dr. Gwenlyn Found today. ? ?Will await further advisement. ?

## 2021-08-31 NOTE — Patient Instructions (Signed)
Medication Instructions:  ? ?-Stop mexiletine (mexitil). ? ?*If you need a refill on your cardiac medications before your next appointment, please call your pharmacy* ? ? ?Testing/Procedures: ?Your physician has requested that you have a carotid duplex. This test is an ultrasound of the carotid arteries in your neck. It looks at blood flow through these arteries that supply the brain with blood. Allow one hour for this exam. There are no restrictions or special instructions. To be done in September. This procedure is done at [place]. [address] ? ? ?Follow-Up: ?At [facility], you and your health needs are our priority.  As part of our continuing mission to provide you with exceptional heart care, we have created designated Provider Care Teams.  These Care Teams include your primary Cardiologist (physician) and Advanced Practice Providers (APPs -  Physician Assistants and Nurse Practitioners) who all work together to provide you with the care you need, when you need it. ? ?We recommend signing up for the patient portal called "MyChart".  Sign up information is provided on this After Visit Summary.  MyChart is used to connect with patients for Virtual Visits (Telemedicine).  Patients are able to view lab/test results, encounter notes, upcoming appointments, etc.  Non-urgent messages can be sent to your provider as well.   ?To learn more about what you can do with MyChart, go to [email or web address].   ? ?Your next appointment:   ?6 month(s) ? ?The format for your next appointment:   ?In Person ? ?Provider:   ?[name], MD   ? ? ?Other Instructions ?Dr. Gwenlyn Found has requested that you schedule an appointment with one of our clinical pharmacists for a blood pressure check appointment within the next 4 weeks.  ?If you monitor your blood pressure (BP) at home, please bring your BP cuff and your BP readings with you to this appointment ? ?HOW TO TAKE YOUR BLOOD PRESSURE: ?Rest 5 minutes before taking your  blood pressure. ?Don?t smoke or drink caffeinated beverages for at least 30 minutes before. ?Take your blood pressure before (not after) you eat. ?Sit comfortably with your back supported and both feet on the floor (don?t cross your legs). ?Elevate your arm to heart level on a table or a desk. ?Use the proper sized cuff. It should fit smoothly and snugly around your bare upper arm. There should be enough room to slip a fingertip under the cuff. The bottom edge of the cuff should be 1 inch above the crease of the elbow. ?Ideally, take 3 measurements at one sitting and record the average.  ? ? ?

## 2021-08-31 NOTE — Assessment & Plan Note (Signed)
History of essential hypertension blood pressure measured today at 160/88.  She is on metoprolol.  She says at the end of the day her blood pressure usually comes down.  Her renal function is normal.  I am going to have her check her blood pressure every day for 30 days and see a Pharm.D. back to review.  She may need an additional antihypertensive medication. ?

## 2021-09-06 ENCOUNTER — Encounter: Payer: Self-pay | Admitting: Cardiovascular Disease

## 2021-09-18 DIAGNOSIS — Z20822 Contact with and (suspected) exposure to covid-19: Secondary | ICD-10-CM | POA: Diagnosis not present

## 2021-09-19 ENCOUNTER — Ambulatory Visit (INDEPENDENT_AMBULATORY_CARE_PROVIDER_SITE_OTHER): Payer: Medicare Other | Admitting: Cardiology

## 2021-09-19 ENCOUNTER — Encounter: Payer: Self-pay | Admitting: Cardiology

## 2021-09-19 VITALS — BP 148/64 | HR 64 | Ht 59.0 in | Wt 123.0 lb

## 2021-09-19 DIAGNOSIS — I6523 Occlusion and stenosis of bilateral carotid arteries: Secondary | ICD-10-CM

## 2021-09-19 DIAGNOSIS — I480 Paroxysmal atrial fibrillation: Secondary | ICD-10-CM | POA: Diagnosis not present

## 2021-09-19 DIAGNOSIS — I493 Ventricular premature depolarization: Secondary | ICD-10-CM

## 2021-09-19 DIAGNOSIS — I10 Essential (primary) hypertension: Secondary | ICD-10-CM

## 2021-09-19 DIAGNOSIS — I2510 Atherosclerotic heart disease of native coronary artery without angina pectoris: Secondary | ICD-10-CM

## 2021-09-19 MED ORDER — AMIODARONE HCL 200 MG PO TABS: ORAL_TABLET | ORAL | 3 refills | Status: DC

## 2021-09-19 MED ORDER — FUROSEMIDE 20 MG PO TABS: 20.0000 mg | ORAL_TABLET | Freq: Every day | ORAL | 0 refills | Status: DC

## 2021-09-19 NOTE — Patient Instructions (Addendum)
Medication Instructions:  ?Start Amiodarone 200 mg two times a day for 4 weeks then 200 mg daily ?Take Lasix 20 mg daily for 3 days ?Your physician recommends that you continue on your current medications as directed. Please refer to the Current Medication list given to you today. ?*If you need a refill on your cardiac medications before your next appointment, please call your pharmacy* ? ?Lab Work: ?None. ?If you have labs (blood work) drawn today and your tests are completely normal, you will receive your results only by: ?MyChart Message (if you have MyChart) OR ?A paper copy in the mail ?If you have any lab test that is abnormal or we need to change your treatment, we will call you to review the results. ? ?Testing/Procedures: ?None. ? ?Follow-Up: ?At [facility], you and your health needs are our priority.  As part of our continuing mission to provide you with exceptional heart care, we have created designated Provider Care Teams.  These Care Teams include your primary Cardiologist (physician) and Advanced Practice Providers (APPs -  Physician Assistants and Nurse Practitioners) who all work together to provide you with the care you need, when you need it. ? ?Your physician wants you to follow-up in: 3 months with one of the following Advanced Practice Providers on your designated Care Team:   ? ?Tommye Standard, PA-C ?Legrand Como "Jonni Sanger" [place], PA-C ? ? ?We recommend signing up for the patient portal called "MyChart".  Sign up information is provided on this After Visit Summary.  MyChart is used to connect with patients for Virtual Visits (Telemedicine).  Patients are able to view lab/test results, encounter notes, upcoming appointments, etc.  Non-urgent messages can be sent to your provider as well.   ?To learn more about what you can do with MyChart, go to [email or web address].   ? ?Any Other Special Instructions Will Be Listed Below (If Applicable). ? ? ? ? ?  ? ? ?

## 2021-09-19 NOTE — Progress Notes (Signed)
? ?Electrophysiology Office Note ? ? ?Date:  09/19/2021  ? ?ID:  Brittany Kirk, DOB [birth year], MRN [id number] ? ?PCP:  Susy Frizzle, MD  ?Cardiologist:  Gwenlyn Found ?Primary Electrophysiologist:  Ajla Mcgeachy Meredith Leeds, MD   ? ?Chief Complaint: AF ?  ?History of Present Illness: ?Brittany Kirk is a 76 y.o. female who is being seen today for the evaluation of AF at the request of Susy Frizzle, MD. Presenting today for electrophysiology evaluation. ? ?For coronary artery disease, paroxysmal atrial fibrillation, hypertension, hyperlipidemia, carotid artery disease, breast cancer status postlumpectomy in 2015.  Left heart catheterization in 2019 showed single-vessel coronary disease.  She medical therapy was recommended.  She presented to the hospital August 2022 in atrial fibrillation.  She is status post atrial fibrillation ablation 04/14/2021.  She was noted to have an elevated burden of PVCs and is now on mexiletine.  Unfortunately she developed significant tremors on mexiletine. ? ?Today, denies symptoms of palpitations, chest pain, orthopnea, PND, lower extremity edema, claudication, dizziness, presyncope, syncope, bleeding, or neurologic sequela. The patient is tolerating medications without difficulties.  Noted any further episodes of atrial fibrillation.  She is overall happy with her control from that standpoint.  Unfortunately she has had more frequent PVCs.  She has continued to have symptoms after stopping the mexiletine.  She has fatigue and shortness of breath.  She has checked her heart rates and blood pressures and have noted heart rates in the 30s to 40s at times. ? ? ?Past Medical History:  ?Diagnosis Date  ? Acid reflux   ? Allergic rhinitis   ? Anxiety   ? Asymptomatic bilateral carotid artery stenosis   ? 40-59% (02/2018)  ? Atrial fibrillation ([place]) 07/2019  ? Breast cancer ([place]) 2015  ? Right Breast Cancer  ? CAD (coronary artery disease)   ? Depression   ? Emphysema   ? no longer uses  inhalers  ? Gallstones   ? nausea, pain upper right abdomen  ? GERD (gastroesophageal reflux disease)   ? H/O hiatal hernia   ? Hiatal hernia   ? History of skin cancer   ? Hyperlipidemia   ? Hypertension   ? Multinodular goiter (nontoxic)   ? Scoliosis   ? Skin cancer   ? Tobacco user   ? Wears dentures   ? top  ? ?Past Surgical History:  ?Procedure Laterality Date  ? ATRIAL FIBRILLATION ABLATION N/A 04/14/2021  ? Procedure: ATRIAL FIBRILLATION ABLATION;  Surgeon: Constance Haw, MD;  Location: [place] [facility];  Service: Cardiovascular;  Laterality: N/A;  ? BREAST LUMPECTOMY Right 2015  ? CHOLECYSTECTOMY  07/08/2012  ? Procedure: LAPAROSCOPIC CHOLECYSTECTOMY WITH INTRAOPERATIVE CHOLANGIOGRAM;  Surgeon: Gayland Curry, MD,FACS;  Location: [facility];  Service: General;  Laterality: N/A;  Laparoscopic Cholecystectomy with Intraoperative Cholangiogram  ? COLONOSCOPY    ? ESOPHAGOSCOPY N/A 05/29/2017  ? Procedure: ESOPHAGOSCOPY;  Surgeon: Clarene Essex, MD;  Location: [place];  Service: Endoscopy;  Laterality: N/A;  botox injection  ? HAND SURGERY Left   ? wrist  ? LEFT HEART CATH AND CORONARY ANGIOGRAPHY N/A 10/11/2017  ? Procedure: LEFT HEART CATH AND CORONARY ANGIOGRAPHY;  Surgeon: Martinique, Peter M, MD;  Location: [place] [facility];  Service: Cardiovascular;  Laterality: N/A;  ? TONSILECTOMY, ADENOIDECTOMY, BILATERAL MYRINGOTOMY AND TUBES    ? TUBAL LIGATION    ? ? ? ?Current Outpatient Medications  ?Medication Sig Dispense Refill  ? acetaminophen (TYLENOL) 500 MG tablet Take 1,000 mg by mouth  every 6 (six) hours as needed for moderate pain or headache.    ? alendronate (FOSAMAX) 70 MG tablet Take 1 tablet (70 mg total) by mouth every 7 (seven) days. Take with a full glass of water on an empty stomach. 4 tablet 11  ? ascorbic acid (VITAMIN C) 500 MG tablet Take 500 mg by mouth daily.    ? atorvastatin (LIPITOR) 40 MG tablet TAKE 1 TABLET BY MOUTH EVERYDAY AT BEDTIME 90 tablet 2  ? Calcium Carbonate (CALCIUM 500  PO) Take 500 mg by mouth daily.    ? cholecalciferol (VITAMIN D3) 25 MCG (1000 UNIT) tablet Take 1,000 Units by mouth daily.    ? Cyanocobalamin (B-12) 1000 MCG TBCR Take 1,000 mcg by mouth daily.    ? diazepam (VALIUM) 5 MG tablet Take 5 mg by mouth daily as needed for anxiety.    ? diltiazem (CARDIZEM) 30 MG tablet Take 1 table as needed for palpitations. Can take 2nd table after 3 hours if persistent palpitations. 30 tablet 1  ? ELIQUIS 5 MG TABS tablet TAKE 1 TABLET BY MOUTH TWICE A DAY 180 tablet 1  ? furosemide (LASIX) 20 MG tablet Take 1 tablet (20 mg total) by mouth daily. For the next 2 days 2 tablet 0  ? metoprolol succinate (TOPROL-XL) 25 MG 24 hr tablet TAKE 1/2 TABLET BY MOUTH EVERY DAY 45 tablet 3  ? omeprazole (PRILOSEC) 20 MG capsule Take 20 mg by mouth every evening.    ? traZODone (DESYREL) 50 MG tablet Take 0.5-1 tablets (25-50 mg total) by mouth at bedtime as needed. for sleep 90 tablet 3  ? zinc gluconate 50 MG tablet Take 50 mg by mouth daily.    ? ?No current facility-administered medications for this visit.  ? ? ?Allergies:   Penicillins, Levofloxacin, Cefaclor, Latex, Metronidazole, Naproxen, Nsaids, Septra [bactrim], Sulfonamide derivatives, and Tape  ? ?Social History:  The patient  reports that she quit smoking about 10 years ago. Her smoking use included cigarettes. She has a 23.00 pack-year smoking history. She has never used smokeless tobacco. She reports that she does not drink alcohol and does not use drugs.  ? ?Family History:  The patient's family history includes Aplastic anemia in an other family member; Arthritis in her mother; Diverticulitis in her sister; Heart disease in her sister and another family member; Pancreatic cancer in her mother.  ? ?ROS:  Please see the history of present illness.   Otherwise, review of systems is positive for none.   All other systems are reviewed and negative.  ? ?PHYSICAL EXAM: ?VS:  Ht '4\' 11"'$  (1.499 m)   Wt 123 lb (55.8 kg)   BMI 24.84 kg/m?   , BMI Body mass index is 24.84 kg/m?. ?GEN: Well nourished, well developed, in no acute distress  ?HEENT: normal  ?Neck: no JVD, carotid bruits, or masses ?Cardiac: irregular; no murmurs, rubs, or gallops,no edema  ?Respiratory:  clear to auscultation bilaterally, normal work of breathing ?GI: soft, nontender, nondistended, + BS ?MS: no deformity or atrophy  ?Skin: warm and dry ?Neuro:  Strength and sensation are intact ?Psych: euthymic mood, full affect ? ?EKG:  EKG is ordered today. ?Personal review of the ekg ordered shows sinus rhythm, PVCs ? ?Recent Labs: ?01/13/2021: TSH 0.489 ?06/17/2021: B Natriuretic Peptide 372.1; BUN 19; Creatinine, Ser 0.91; Hemoglobin 12.3; Platelets 245; Potassium 3.5; Sodium 136  ? ? ?Lipid Panel  ?   ?Component Value Date/Time  ? CHOL 133 07/15/2019 0934  ? CHOL  124 02/27/2017 0940  ? TRIG 48 07/15/2019 0934  ? HDL 59 07/15/2019 0934  ? HDL 58 02/27/2017 0940  ? CHOLHDL 2.3 07/15/2019 0934  ? VLDL 10 07/15/2019 0934  ? [place] 64 07/15/2019 0934  ? [place] 61 07/14/2018 1149  ? ? ? ?Wt Readings from Last 3 Encounters:  ?08/31/21 118 lb 3.2 oz (53.6 kg)  ?08/08/21 121 lb (54.9 kg)  ?06/26/21 130 lb (59 kg)  ?  ? ? ?Other studies Reviewed: ?Additional studies/ records that were reviewed today include: Cardiac monitor 05/19/2021 personally reviewed ?Review of the above records today demonstrates:  ?1. SR/SB/ST ?2. Freq PVCs often in a Bi/Tri geminal pattern ? ?TTE 05/17/2021 personally reviewed ? 1. Left ventricular ejection fraction, by estimation, is 55 to 60%. The  ?left ventricle has normal function. The left ventricle has no regional  ?wall motion abnormalities. There is mild asymmetric left ventricular  ?hypertrophy of the basal-septal segment.  ?Left ventricular diastolic parameters were normal.  ? 2. Right ventricular systolic function is normal. The right ventricular  ?size is normal. There is mildly elevated pulmonary artery systolic  ?pressure. The estimated right ventricular  systolic pressure is 19.6 mmHg.  ? 3. The mitral valve is normal in structure. Mild mitral valve  ?regurgitation.  ? 4. The aortic valve is tricuspid. There is mild calcification of the  ?aortic valve. There is m

## 2021-09-21 ENCOUNTER — Ambulatory Visit (HOSPITAL_COMMUNITY)
Admission: RE | Admit: 2021-09-21 | Payer: Medicare Other | Source: Ambulatory Visit | Attending: Cardiovascular Disease | Admitting: Cardiovascular Disease

## 2021-09-28 ENCOUNTER — Encounter: Payer: Self-pay | Admitting: Pharmacist

## 2021-09-28 ENCOUNTER — Ambulatory Visit (INDEPENDENT_AMBULATORY_CARE_PROVIDER_SITE_OTHER): Payer: Medicare Other | Admitting: Pharmacist

## 2021-09-28 VITALS — BP 198/76 | HR 48 | Resp 15 | Ht 59.0 in | Wt 125.0 lb

## 2021-09-28 DIAGNOSIS — I10 Essential (primary) hypertension: Secondary | ICD-10-CM | POA: Diagnosis not present

## 2021-09-28 DIAGNOSIS — I6523 Occlusion and stenosis of bilateral carotid arteries: Secondary | ICD-10-CM | POA: Diagnosis not present

## 2021-09-28 DIAGNOSIS — I4891 Unspecified atrial fibrillation: Secondary | ICD-10-CM | POA: Diagnosis not present

## 2021-09-28 MED ORDER — FUROSEMIDE 20 MG PO TABS: 20.0000 mg | ORAL_TABLET | Freq: Every day | ORAL | 2 refills | Status: DC | PRN

## 2021-09-28 NOTE — Progress Notes (Signed)
Patient ID: Brittany Kirk                 DOB: 04-17-1946                      MRN: [id number] ? ? ? ? ?HPI: ?Brittany Kirk is a 76 y.o. female referred by Dr.Berry to HTN clinic. PMH is significant for atiral fibrilliation, CAD, HTN, COPD, and PVCs.  Patient is also seen by Dr Curt Bears for atrial fibrillation and was recently started on amiodarone. ? ?Patient presents today feeling poorly.  Blood pressure is very labile with systolic readings ranging from 120s to 190s.  Typically very elevated.  Pulse rate however is very low ranging from low 30s-50s. ? ?She is fatigued and depressed she is unable to finish her housework. Currently on metoprolol 12.'5mg'$  daily. ? ?Her feet are swollen and painful. She reports Dr Curt Bears gave her three days of Lasix at last visit which gave her relief however the swelling has come back.  Has noticed she has been gaining weight but has not been weighing herself consistently. ? ?Current HTN meds: metoprolol 12.'5mg'$  daily ? ? ?Wt Readings from Last 3 Encounters:  ?09/28/21 125 lb (56.7 kg)  ?09/19/21 123 lb (55.8 kg)  ?08/31/21 118 lb 3.2 oz (53.6 kg)  ? ?BP Readings from Last 3 Encounters:  ?09/19/21 (!) 148/64  ?08/31/21 (!) 160/88  ?08/08/21 (!) 150/62  ? ?Pulse Readings from Last 3 Encounters:  ?09/19/21 64  ?08/31/21 60  ?08/08/21 (!) 58  ? ? ?Renal function: ?CrCl cannot be calculated (Patient's most recent lab result is older than the maximum 21 days allowed.). ? ?Past Medical History:  ?Diagnosis Date  ? Acid reflux   ? Allergic rhinitis   ? Anxiety   ? Asymptomatic bilateral carotid artery stenosis   ? 40-59% (02/2018)  ? Atrial fibrillation ([place]) 07/2019  ? Breast cancer ([place]) 2015  ? Right Breast Cancer  ? CAD (coronary artery disease)   ? Depression   ? Emphysema   ? no longer uses inhalers  ? Gallstones   ? nausea, pain upper right abdomen  ? GERD (gastroesophageal reflux disease)   ? H/O hiatal hernia   ? Hiatal hernia   ? History of skin cancer   ? Hyperlipidemia   ?  Hypertension   ? Multinodular goiter (nontoxic)   ? Scoliosis   ? Skin cancer   ? Tobacco user   ? Wears dentures   ? top  ? ? ?Current Outpatient Medications on File Prior to Visit  ?Medication Sig Dispense Refill  ? acetaminophen (TYLENOL) 500 MG tablet Take 1,000 mg by mouth every 6 (six) hours as needed for moderate pain or headache.    ? amiodarone (PACERONE) 200 MG tablet Take 1 tablet (200 mg total) by mouth 2 (two) times daily for 28 days, THEN 1 tablet (200 mg total) daily. 90 tablet 3  ? ascorbic acid (VITAMIN C) 500 MG tablet Take 500 mg by mouth daily.    ? atorvastatin (LIPITOR) 40 MG tablet TAKE 1 TABLET BY MOUTH EVERYDAY AT BEDTIME 90 tablet 2  ? cholecalciferol (VITAMIN D3) 25 MCG (1000 UNIT) tablet Take 1,000 Units by mouth daily.    ? Cyanocobalamin (B-12) 1000 MCG TBCR Take 1,000 mcg by mouth daily.    ? diazepam (VALIUM) 5 MG tablet Take 5 mg by mouth daily as needed for anxiety.    ? diltiazem (CARDIZEM) 30 MG tablet Take 1 table  as needed for palpitations. Can take 2nd table after 3 hours if persistent palpitations. 30 tablet 1  ? ELIQUIS 5 MG TABS tablet TAKE 1 TABLET BY MOUTH TWICE A DAY 180 tablet 1  ? Magnesium 200 MG TABS Take 20 mg by mouth daily.    ? metoprolol succinate (TOPROL-XL) 25 MG 24 hr tablet TAKE 1/2 TABLET BY MOUTH EVERY DAY 45 tablet 3  ? omeprazole (PRILOSEC) 40 MG capsule Take 40 mg by mouth daily.    ? zinc gluconate 50 MG tablet Take 50 mg by mouth daily.    ? alendronate (FOSAMAX) 70 MG tablet Take 1 tablet (70 mg total) by mouth every 7 (seven) days. Take with a full glass of water on an empty stomach. (Patient not taking: Reported on 09/28/2021) 4 tablet 11  ? omeprazole (PRILOSEC) 20 MG capsule Take 20 mg by mouth every evening. (Patient not taking: Reported on 09/28/2021)    ? ?No current facility-administered medications on file prior to visit.  ? ? ?Allergies  ?Allergen Reactions  ? Penicillins Anaphylaxis, Itching, Swelling and Rash  ?  Has patient had a PCN  reaction causing immediate rash, facial/tongue/throat swelling, SOB or lightheadedness with hypotension: Yes ?Has patient had a PCN reaction causing severe rash involving mucus membranes or skin necrosis: No ?Has patient had a PCN reaction that required hospitalization: Yes ?Has patient had a PCN reaction occurring within the last 10 years: No ?If all of the above answers are "NO", then may proceed with Cephalosporin use. ?  ? Fosamax [Alendronate]   ?  myalgias  ? Levofloxacin Nausea Only and Other (See Comments)  ?  Causes headache, fatigue and dizziness  ? Cefaclor Itching, Swelling and Rash  ? Latex Itching and Rash  ? Metronidazole Itching, Swelling and Rash  ? Naproxen Itching, Swelling and Rash  ? Nsaids Itching, Swelling and Rash  ?  Ibuprofen IS tolerated  ? Septra [Bactrim] Itching, Swelling and Rash  ? Sulfonamide Derivatives Itching, Swelling and Rash  ? Tape Itching and Rash  ?  ONLY USE PAPER TAPE  ? ? ? ?Assessment/Plan: ? ?1. Hypertension -  Patient BP in room 198/176 which is well above goal and patient feels quite poorly.  Despite HTN, will d/c metoprolol at this time due to patient's low pulse rate and fatigue.  Will also start patient on furosemide '20mg'$  daily as needed since it gave her relief last time. Recommended she begin weighing herself daily to self monitor for fluid retention.  Instructed patient to continue to monitor BP at home and will recheck in 2 weeks and consider starting antihypertensive at that time. ? ?Karren Cobble, PharmD, BCACP, [place], CPP ?[address], [address] ?[place], [place], [address] ?Phone: [phone], Fax: [phone]  ?

## 2021-09-28 NOTE — Patient Instructions (Signed)
It was nice meeting you today ? ?We will stop your metoprolol at this time to try to increase your heart rate and give you more energy ? ?I will also send in a prescription for some furosemide.  You can tablet by mouth daily as needed for swelling ? ?Start weighing yourself at home and keep track of your weight as well as your blood pressure and heart rate ? ?Please call with any questions ? ?Karren Cobble, PharmD, BCACP, [place], CPP ?[address], [address] ?[place], [place], [address] ?Phone: [phone], Fax: [phone]  ? ?

## 2021-10-02 DIAGNOSIS — Z20822 Contact with and (suspected) exposure to covid-19: Secondary | ICD-10-CM | POA: Diagnosis not present

## 2021-10-06 DIAGNOSIS — Z20822 Contact with and (suspected) exposure to covid-19: Secondary | ICD-10-CM | POA: Diagnosis not present

## 2021-10-12 ENCOUNTER — Ambulatory Visit (INDEPENDENT_AMBULATORY_CARE_PROVIDER_SITE_OTHER): Payer: Medicare Other | Admitting: Pharmacist Clinician (PhC)/ Clinical Pharmacy Specialist

## 2021-10-12 VITALS — BP 174/66 | HR 57 | Resp 16 | Ht 59.0 in | Wt 123.0 lb

## 2021-10-12 DIAGNOSIS — I10 Essential (primary) hypertension: Secondary | ICD-10-CM | POA: Diagnosis not present

## 2021-10-12 DIAGNOSIS — I6523 Occlusion and stenosis of bilateral carotid arteries: Secondary | ICD-10-CM

## 2021-10-12 MED ORDER — VALSARTAN 80 MG PO TABS: 80.0000 mg | ORAL_TABLET | Freq: Every day | ORAL | 6 refills | Status: DC

## 2021-10-12 MED ORDER — APIXABAN 5 MG PO TABS: 5.0000 mg | ORAL_TABLET | Freq: Two times a day (BID) | ORAL | 6 refills | Status: DC

## 2021-10-12 NOTE — Patient Instructions (Signed)
Return for a a follow up appointment Tuesday June 6 at 3 pm ? ?Go to the lab in 2 weeks to check kidney function (week of May 15)  ? ?Check your blood pressure at home daily and keep record of the readings. ? ?Take your BP meds as follows: ? Start valsartan 80 mg once daily ? ?Bring all of your meds, your BP cuff and your record of home blood pressures to your next appointment.  Exercise as you?re able, try to walk approximately 30 minutes per day.  Keep salt intake to a minimum, especially watch canned and prepared boxed foods.  Eat more fresh fruits and vegetables and fewer canned items.  Avoid eating in fast food restaurants.  ? ? HOW TO TAKE YOUR BLOOD PRESSURE: ?Rest 5 minutes before taking your blood pressure. ? Don?t smoke or drink caffeinated beverages for at least 30 minutes before. ?Take your blood pressure before (not after) you eat. ?Sit comfortably with your back supported and both feet on the floor (don?t cross your legs). ?Elevate your arm to heart level on a table or a desk. ?Use the proper sized cuff. It should fit smoothly and snugly around your bare upper arm. There should be enough room to slip a fingertip under the cuff. The bottom edge of the cuff should be 1 inch above the crease of the elbow. ?Ideally, take 3 measurements at one sitting and record the average. ? ? ?

## 2021-10-12 NOTE — Progress Notes (Signed)
? ? ? ?10/13/2021 ?Colin Benton Moynahan ?1946/04/16 ?[id number] ? ? ?HPI:  Brittany Kirk is a 76 y.o. female patient of Dr Gwenlyn Found, with a PMH below who presents today for hypertension clinic evaluation.   She was seen by Dr. Gwenlyn Found back in March, at which time her pressure was 160/88.  He advised monitoring for a month and follow up with CVRR.  She was then seen by Karren Cobble PharmD just 2 weeks ago.  At that time she noted feeling poorly.  She noted fatigue and depression, stating she was unable to complete housework without stopping to rest.  Home readings were labile, ranging 120-190's.  When she saw Dr. Curt Bears, he had given her furosemide for 3 days because of swelling.  She reported that this worked, but swelling returned after those 3 days. At that visit [name] discontinued metoprolol and added in furosemide 20 mg.  It was recommended that she weigh herself daily and return in 2 weeks to further address the hypertension issue.   ? ?Today she returns for follow up.  Notes that she used furosemide for a week then has cut back to just as needed, with last dose 3-4 days ago.  Home BP readings still rather elevated (see below).  Interestingly, when looking at her readings, the first reading of the day has normal diastolic (mostly 77-93'J), however when she checks her BP again later in the mornings, many of thie diastolic readings drop to 50s, with the lowest at 49.  She states her cuff was checked at her last appointment here and found to be accurate.   ? ?Past Medical History: ?AF Ablation 11/22, CHADS2-VASc score 5  ?CAD Cath 5/19 Ost ramus 85% stenosed, other areas of < 50% stenosis also noted  ?PVC's On amiodarone   ?  ? ?Blood Pressure Goal:  130/80 ? ?Current Medications: no blood pressure medications ? ?Social Hx: 25 pack year history, quit 3 years ago ? ?Home BP readings:  17 am readings average 175/73, HR 77 ? ?Intolerances: no cardiac medication intolerances ? ?Labs: 1/23:  na 136, K 3.5, Glu 106, BUN 19, SCr  0.91, GFR > 60 ? ? ?Wt Readings from Last 3 Encounters:  ?10/12/21 123 lb (55.8 kg)  ?09/28/21 125 lb (56.7 kg)  ?09/19/21 123 lb (55.8 kg)  ? ?BP Readings from Last 3 Encounters:  ?10/12/21 (!) 174/66  ?09/28/21 (!) 198/76  ?09/19/21 (!) 148/64  ? ?Pulse Readings from Last 3 Encounters:  ?10/12/21 (!) 57  ?09/28/21 (!) 48  ?09/19/21 64  ? ? ?Current Outpatient Medications  ?Medication Sig Dispense Refill  ? acetaminophen (TYLENOL) 500 MG tablet Take 1,000 mg by mouth every 6 (six) hours as needed for moderate pain or headache.    ? amiodarone (PACERONE) 200 MG tablet Take 1 tablet (200 mg total) by mouth 2 (two) times daily for 28 days, THEN 1 tablet (200 mg total) daily. 90 tablet 3  ? ascorbic acid (VITAMIN C) 500 MG tablet Take 500 mg by mouth daily.    ? atorvastatin (LIPITOR) 40 MG tablet TAKE 1 TABLET BY MOUTH EVERYDAY AT BEDTIME 90 tablet 2  ? cholecalciferol (VITAMIN D3) 25 MCG (1000 UNIT) tablet Take 1,000 Units by mouth daily.    ? Cyanocobalamin (B-12) 1000 MCG TBCR Take 1,000 mcg by mouth daily.    ? diazepam (VALIUM) 5 MG tablet Take 5 mg by mouth daily as needed for anxiety.    ? diltiazem (CARDIZEM) 30 MG tablet Take 1 table as  needed for palpitations. Can take 2nd table after 3 hours if persistent palpitations. 30 tablet 1  ? furosemide (LASIX) 20 MG tablet Take 1 tablet (20 mg total) by mouth daily as needed. 30 tablet 2  ? Magnesium 200 MG TABS Take 20 mg by mouth daily.    ? omeprazole (PRILOSEC) 40 MG capsule Take 40 mg by mouth daily.    ? valsartan (DIOVAN) 80 MG tablet Take 1 tablet (80 mg total) by mouth daily. 30 tablet 6  ? zinc gluconate 50 MG tablet Take 50 mg by mouth daily.    ? apixaban (ELIQUIS) 5 MG TABS tablet Take 1 tablet (5 mg total) by mouth 2 (two) times daily. 60 tablet 6  ? ?No current facility-administered medications for this visit.  ? ? ?Allergies  ?Allergen Reactions  ? Penicillins Anaphylaxis, Itching, Swelling and Rash  ?  Has patient had a PCN reaction causing  immediate rash, facial/tongue/throat swelling, SOB or lightheadedness with hypotension: Yes ?Has patient had a PCN reaction causing severe rash involving mucus membranes or skin necrosis: No ?Has patient had a PCN reaction that required hospitalization: Yes ?Has patient had a PCN reaction occurring within the last 10 years: No ?If all of the above answers are "NO", then may proceed with Cephalosporin use. ?  ? Fosamax [Alendronate]   ?  myalgias  ? Levofloxacin Nausea Only and Other (See Comments)  ?  Causes headache, fatigue and dizziness  ? Cefaclor Itching, Swelling and Rash  ? Latex Itching and Rash  ? Metronidazole Itching, Swelling and Rash  ? Naproxen Itching, Swelling and Rash  ? Nsaids Itching, Swelling and Rash  ?  Ibuprofen IS tolerated  ? Septra [Bactrim] Itching, Swelling and Rash  ? Sulfonamide Derivatives Itching, Swelling and Rash  ? Tape Itching and Rash  ?  ONLY USE PAPER TAPE  ? ? ?Past Medical History:  ?Diagnosis Date  ? Acid reflux   ? Allergic rhinitis   ? Anxiety   ? Asymptomatic bilateral carotid artery stenosis   ? 40-59% (02/2018)  ? Atrial fibrillation ([place]) 07/2019  ? Breast cancer ([place]) 2015  ? Right Breast Cancer  ? CAD (coronary artery disease)   ? Depression   ? Emphysema   ? no longer uses inhalers  ? Gallstones   ? nausea, pain upper right abdomen  ? GERD (gastroesophageal reflux disease)   ? H/O hiatal hernia   ? Hiatal hernia   ? History of skin cancer   ? Hyperlipidemia   ? Hypertension   ? Multinodular goiter (nontoxic)   ? Scoliosis   ? Skin cancer   ? Tobacco user   ? Wears dentures   ? top  ? ? ?Blood pressure (!) 174/66, pulse (!) 57, resp. rate 16, height '4\' 11"'$  (1.499 m), weight 123 lb (55.8 kg), SpO2 100 %. ? ?Essential hypertension ?Patient with essential systolic hypertension.  Discussed need to start medications, however she is worried that medication will drop diastolic readings further.  Will start with valsartan 80 mg once daily.  She will need metabolic panel in 2  weeks and follow up in 1 month.   ? ? ?Tommy Medal PharmD CPP [facility] ?[address] ?[place], [place] [address] ?[phone] ?

## 2021-10-13 ENCOUNTER — Encounter: Payer: Self-pay | Admitting: Pharmacist Clinician (PhC)/ Clinical Pharmacy Specialist

## 2021-10-13 NOTE — Assessment & Plan Note (Signed)
Patient with essential systolic hypertension.  Discussed need to start medications, however she is worried that medication will drop diastolic readings further.  Will start with valsartan 80 mg once daily.  She will need metabolic panel in 2 weeks and follow up in 1 month.   ?

## 2021-10-22 ENCOUNTER — Other Ambulatory Visit: Payer: Self-pay | Admitting: Cardiovascular Disease

## 2021-10-26 DIAGNOSIS — I10 Essential (primary) hypertension: Secondary | ICD-10-CM | POA: Diagnosis not present

## 2021-10-27 LAB — BASIC METABOLIC PANEL
BUN/Creatinine Ratio: 30 — ABNORMAL HIGH (ref 12–28)
BUN: 31 mg/dL — ABNORMAL HIGH (ref 8–27)
CO2: 21 mmol/L (ref 20–29)
Calcium: 9.1 mg/dL (ref 8.7–10.3)
Chloride: 105 mmol/L (ref 96–106)
Creatinine, Ser: 1.05 mg/dL — ABNORMAL HIGH (ref 0.57–1.00)
Glucose: 97 mg/dL (ref 70–99)
Potassium: 4.7 mmol/L (ref 3.5–5.2)
Sodium: 140 mmol/L (ref 134–144)
eGFR: 55 mL/min/{1.73_m2} — ABNORMAL LOW (ref >59)

## 2021-11-09 ENCOUNTER — Ambulatory Visit: Payer: Medicare Other | Admitting: Cardiology

## 2021-11-14 ENCOUNTER — Encounter: Payer: Self-pay | Admitting: Pharmacist

## 2021-11-14 ENCOUNTER — Ambulatory Visit (INDEPENDENT_AMBULATORY_CARE_PROVIDER_SITE_OTHER): Payer: Medicare Other | Admitting: Pharmacist

## 2021-11-14 VITALS — BP 163/54 | HR 94 | Resp 14 | Ht 59.0 in | Wt 125.7 lb

## 2021-11-14 DIAGNOSIS — I6523 Occlusion and stenosis of bilateral carotid arteries: Secondary | ICD-10-CM | POA: Diagnosis not present

## 2021-11-14 DIAGNOSIS — I10 Essential (primary) hypertension: Secondary | ICD-10-CM

## 2021-11-14 MED ORDER — VALSARTAN 40 MG PO TABS: 40.0000 mg | ORAL_TABLET | Freq: Two times a day (BID) | ORAL | 2 refills | Status: DC

## 2021-11-14 NOTE — Patient Instructions (Addendum)
It was good seeing you today  I would like to change your valsartan from 80 mg daily to 40 mg twice a day  I would like to set you up with an appointment next week for the provider to look at your feet  Please continue to check your blood pressure at home  Please call with any questions  Karren Cobble, PharmD, [place], [place], [place], [place], [place], [address] Phone: [phone], Fax: [phone]

## 2021-11-14 NOTE — Progress Notes (Signed)
Patient ID: Brittany Kirk                 DOB: Aug 25, 1945                      MRN: [id number]     HPI: Brittany Kirk is a 76 y.o. female referred by Dr. Gwenlyn Found to HTN clinic. PMH is significant for CAD, HTN, A Fib, COPD, bradycardia and CKD.  This is patient's third visit to HTN clinic.  Patient presents today with concerns regarding her feet, veins, and HR.  Pulse remains low, typically in 40s.  Very slight improvement from April when she was on metoprolol which was discontinued at that time.  No longer having swelling since starting on Lasix. Has only been taking PRN.  Reports one dose in past week.   Feet become dark blue/purple when she sits down and are cold.  When she stands up and begins moving they return to normal color.   Blood pressure remains elevated in morning but has reduced considerably in afternoon with valsartan '80mg'$  daily.  Still feels tired in afternoons.  Takes valsartan '80mg'$  in morning.  Recent BP readings: 6/5: 167/67, 48   123/48, 50 6/4: 151/57, 46   136/57, 46 6/3: 168/63, 51   161/69, 47 6/2: 153/68, 49   126/50, 48 6/1: 180/77, 48   117/50, 47  Current HTN meds:  Valsartan '80mg'$  daily  Wt Readings from Last 3 Encounters:  11/14/21 125 lb 11.2 oz (57 kg)  10/12/21 123 lb (55.8 kg)  09/28/21 125 lb (56.7 kg)   BP Readings from Last 3 Encounters:  11/14/21 (!) 163/54  10/12/21 (!) 174/66  09/28/21 (!) 198/76   Pulse Readings from Last 3 Encounters:  11/14/21 94  10/12/21 (!) 57  09/28/21 (!) 48    Renal function: Estimated Creatinine Clearance: 34.5 mL/min (A) (by C-G formula based on SCr of 1.05 mg/dL (H)).  Past Medical History:  Diagnosis Date   Acid reflux    Allergic rhinitis    Anxiety    Asymptomatic bilateral carotid artery stenosis    40-59% (02/2018)   Atrial fibrillation ([place]) 07/2019   Breast cancer ([place]) 2015   Right Breast Cancer   CAD (coronary artery disease)    Depression    Emphysema    no longer uses inhalers    Gallstones    nausea, pain upper right abdomen   GERD (gastroesophageal reflux disease)    H/O hiatal hernia    Hiatal hernia    History of skin cancer    Hyperlipidemia    Hypertension    Multinodular goiter (nontoxic)    Scoliosis    Skin cancer    Tobacco user    Wears dentures    top    Current Outpatient Medications on File Prior to Visit  Medication Sig Dispense Refill   acetaminophen (TYLENOL) 500 MG tablet Take 1,000 mg by mouth every 6 (six) hours as needed for moderate pain or headache.     amiodarone (PACERONE) 200 MG tablet Take 1 tablet (200 mg total) by mouth 2 (two) times daily for 28 days, THEN 1 tablet (200 mg total) daily. 90 tablet 3   apixaban (ELIQUIS) 5 MG TABS tablet Take 1 tablet (5 mg total) by mouth 2 (two) times daily. 60 tablet 6   ascorbic acid (VITAMIN C) 500 MG tablet Take 500 mg by mouth daily.     atorvastatin (LIPITOR) 40 MG tablet TAKE 1  TABLET BY MOUTH EVERYDAY AT BEDTIME 90 tablet 2   cholecalciferol (VITAMIN D3) 25 MCG (1000 UNIT) tablet Take 1,000 Units by mouth daily.     Cyanocobalamin (B-12) 1000 MCG TBCR Take 1,000 mcg by mouth daily.     furosemide (LASIX) 20 MG tablet Take 1 tablet (20 mg total) by mouth daily as needed. 30 tablet 2   Magnesium 200 MG TABS Take 200 mg by mouth daily.     Menaquinone-7 (K2 PO) Take 1 tablet by mouth daily.     omeprazole (PRILOSEC) 40 MG capsule Take 40 mg by mouth daily.     valsartan (DIOVAN) 80 MG tablet Take 1 tablet (80 mg total) by mouth daily. 30 tablet 6   zinc gluconate 50 MG tablet Take 50 mg by mouth daily.     diazepam (VALIUM) 5 MG tablet Take 5 mg by mouth daily as needed for anxiety. (Patient not taking: Reported on 11/14/2021)     diltiazem (CARDIZEM) 30 MG tablet Take 1 table as needed for palpitations. Can take 2nd table after 3 hours if persistent palpitations. (Patient not taking: Reported on 11/14/2021) 30 tablet 1   No current facility-administered medications on file prior to visit.     Allergies  Allergen Reactions   Penicillins Anaphylaxis, Itching, Swelling and Rash    Has patient had a PCN reaction causing immediate rash, facial/tongue/throat swelling, SOB or lightheadedness with hypotension: Yes Has patient had a PCN reaction causing severe rash involving mucus membranes or skin necrosis: No Has patient had a PCN reaction that required hospitalization: Yes Has patient had a PCN reaction occurring within the last 10 years: No If all of the above answers are "NO", then may proceed with Cephalosporin use.    Fosamax [Alendronate]     myalgias   Levofloxacin Nausea Only and Other (See Comments)    Causes headache, fatigue and dizziness   Mexiletine Itching, Nausea Only and Other (See Comments)   Cefaclor Itching, Swelling and Rash   Latex Itching and Rash   Metronidazole Itching, Swelling and Rash   Naproxen Itching, Swelling and Rash   Nsaids Itching, Swelling and Rash    Ibuprofen IS tolerated   Septra [Bactrim] Itching, Swelling and Rash   Sulfonamide Derivatives Itching, Swelling and Rash   Tape Itching and Rash    ONLY USE PAPER TAPE     Assessment/Plan:  1. Hypertension -  Patient BP in room 163/54 which remains elevated. Afternoon readings significantly lower however.  Will switch valsartan '80mg'$  daily to valsartan '40mg'$  BID since morning readings remain elevated and this may help her evening readings as well.  Concern regarding both feet. Both quickly turn blue/purple when she sits down and return to normal color when she stands. Would benefit from PV visit.  Walked patient to front desk for appointment.  Switch valsartan to '40mg'$  BID F/U with cardiology/APP regarding possible vascular issues  Karren Cobble, PharmD, BCACP, [place], [place], [place], [place], [address] Phone: [phone], Fax: [phone]

## 2021-11-21 ENCOUNTER — Ambulatory Visit (INDEPENDENT_AMBULATORY_CARE_PROVIDER_SITE_OTHER): Payer: Medicare Other | Admitting: Physician Assistant

## 2021-11-21 ENCOUNTER — Encounter: Payer: Self-pay | Admitting: Physician Assistant

## 2021-11-21 VITALS — BP 148/82 | HR 56 | Ht 59.0 in | Wt 125.4 lb

## 2021-11-21 DIAGNOSIS — R230 Cyanosis: Secondary | ICD-10-CM | POA: Diagnosis not present

## 2021-11-21 DIAGNOSIS — I480 Paroxysmal atrial fibrillation: Secondary | ICD-10-CM | POA: Diagnosis not present

## 2021-11-21 DIAGNOSIS — I10 Essential (primary) hypertension: Secondary | ICD-10-CM

## 2021-11-21 DIAGNOSIS — I493 Ventricular premature depolarization: Secondary | ICD-10-CM

## 2021-11-21 DIAGNOSIS — I6523 Occlusion and stenosis of bilateral carotid arteries: Secondary | ICD-10-CM

## 2021-11-21 DIAGNOSIS — E785 Hyperlipidemia, unspecified: Secondary | ICD-10-CM | POA: Diagnosis not present

## 2021-11-21 DIAGNOSIS — I2510 Atherosclerotic heart disease of native coronary artery without angina pectoris: Secondary | ICD-10-CM | POA: Diagnosis not present

## 2021-11-21 MED ORDER — AMLODIPINE BESYLATE 2.5 MG PO TABS: 2.5000 mg | ORAL_TABLET | Freq: Every day | ORAL | 3 refills | Status: DC

## 2021-11-21 NOTE — Patient Instructions (Addendum)
Medication Instructions:  START Amlodipine 2.5 mg daily  *If you need a refill on your cardiac medications before your next appointment, please call your pharmacy*  Lab Work: NONE ordered at this time of appointment   If you have labs (blood work) drawn today and your tests are completely normal, you will receive your results only by: [facility] (if you have MyChart) OR A paper copy in the mail If you have any lab test that is abnormal or we need to change your treatment, we will call you to review the results.  Testing/Procedures: Your physician has requested that you have an ankle brachial index (ABI). During this test an ultrasound and blood pressure cuff are used to evaluate the arteries that supply the arms and legs with blood. Allow thirty minutes for this exam. There are no restrictions or special instructions. Your physician has requested that you have a lower extremity arterial exercise duplex. During this test, exercise and ultrasound are used to evaluate arterial blood flow in the legs. Allow one hour for this exam. There are no restrictions or special instructions.  Follow-Up: At [facility], you and your health needs are our priority.  As part of our continuing mission to provide you with exceptional heart care, we have created designated Provider Care Teams.  These Care Teams include your primary Cardiologist (physician) and Advanced Practice Providers (APPs -  Physician Assistants and Nurse Practitioners) who all work together to provide you with the care you need, when you need it.   Your next appointment:   As previously scheduled   The format for your next appointment:   In Person  Provider:   [name], MD     Other Instructions   Important Information About Sugar

## 2021-11-21 NOTE — Progress Notes (Signed)
Cardiology Office Note:    Date:  11/23/2021   ID:  Brittany Kirk, DOB 11-03-1945, MRN [id number]  PCP:  Brittany Frizzle, MD   [facility] Providers Cardiologist:  [name], MD Electrophysiologist:  Constance Haw, MD     Referring MD: Brittany Frizzle, MD   Chief Complaint  Patient presents with   Follow-up    Seen for Dr. Gwenlyn Found    History of Present Illness:    Brittany Kirk is a 76 y.o. female with a hx of carotid artery disease, atrial fibrillation, CAD, depression, hypertension, and hyperlipidemia.  She has a strong family of early CAD.  However she herself has never been diagnosed with heart attack or stroke.  She had a remote cardiac catheterization by Dr. Lia Foyer, no intervention was performed.  Myoview in August 2017 was negative.  Diagnostic cardiac catheterization in May 2019 showed moderate severe ramus branch ostial disease, normal EF, unchanged when compared to the previous cath in 2010.  She was seen in the ED in February 2021 due to atrial fibrillation with RVR.  She converted spontaneously to sinus rhythm and was started on Eliquis.  She was readmitted in August 2022 with A-fib with RVR and again self converted.  Troponin was mildly elevated.  2D echo was normal.  She underwent atrial fibrillation ablation on 04/14/2021.  She does have high PVC burden of 17%, she was started on mexiletine in February however this exacerbated her baseline tremor.  Mexiletine was later stopped by Dr. Gwenlyn Found in March.  She was started on amiodarone during last EP visit in April 2023.  Since then, she has been seen multiple times in the hypertension clinic.  She complains of swollen and painful feet and was given few days of diuretic.  Due to bradycardia, metoprolol was discontinued.  During the last visit, it was mentioned that her feet would become dark blue versus purple when she sat down and feels cold.  When she stood up and moving around, her feet were to return to normal  color.  Her blood pressure remained elevated, losartan was divided into 40 mg twice a day since her elevation of the blood pressure is worse in the morning.  She presents today accompanied by her husband for examination of cyanosis of lower extremity.  According to the patient, this actually has been going on for the past several years.  When she said, her ankle and feet would turn purple and dark blue.  However when she stand and walk around, the cyanosis or disappear.  Her feet does feel cold.  She has weak dorsalis pedis pulse on both side but no clear posterior tibial pulse.  She has no pain, no numbness and no tingling sensation.  Given the fact that symptom only occurs once she sit down for long period of time, I am concerned of compression of the arteries.  I recommended lower extremity ABI and arterial Doppler.  I also recommended addition of low-dose amlodipine at 2.5 mg daily for vasodilatory purposes.  Past Medical History:  Diagnosis Date   Acid reflux    Allergic rhinitis    Anxiety    Asymptomatic bilateral carotid artery stenosis    40-59% (02/2018)   Atrial fibrillation ([place]) 07/2019   Breast cancer ([place]) 2015   Right Breast Cancer   CAD (coronary artery disease)    Depression    Emphysema    no longer uses inhalers   Gallstones    nausea, pain upper right  abdomen   GERD (gastroesophageal reflux disease)    H/O hiatal hernia    Hiatal hernia    History of skin cancer    Hyperlipidemia    Hypertension    Multinodular goiter (nontoxic)    Scoliosis    Skin cancer    Tobacco user    Wears dentures    top    Past Surgical History:  Procedure Laterality Date   ATRIAL FIBRILLATION ABLATION N/A 04/14/2021   Procedure: ATRIAL FIBRILLATION ABLATION;  Surgeon: Constance Haw, MD;  Location: [place] [facility];  Service: Cardiovascular;  Laterality: N/A;   BREAST LUMPECTOMY Right 2015   CHOLECYSTECTOMY  07/08/2012   Procedure: LAPAROSCOPIC CHOLECYSTECTOMY WITH  INTRAOPERATIVE CHOLANGIOGRAM;  Surgeon: Gayland Curry, MD,FACS;  Location: [facility];  Service: General;  Laterality: N/A;  Laparoscopic Cholecystectomy with Intraoperative Cholangiogram   COLONOSCOPY     ESOPHAGOSCOPY N/A 05/29/2017   Procedure: ESOPHAGOSCOPY;  Surgeon: Clarene Essex, MD;  Location: [place];  Service: Endoscopy;  Laterality: N/A;  botox injection   HAND SURGERY Left    wrist   LEFT HEART CATH AND CORONARY ANGIOGRAPHY N/A 10/11/2017   Procedure: LEFT HEART CATH AND CORONARY ANGIOGRAPHY;  Surgeon: Martinique, Peter M, MD;  Location: [place] [facility];  Service: Cardiovascular;  Laterality: N/A;   TONSILECTOMY, ADENOIDECTOMY, BILATERAL MYRINGOTOMY AND TUBES     TUBAL LIGATION      Current Medications: Current Meds  Medication Sig   acetaminophen (TYLENOL) 500 MG tablet Take 1,000 mg by mouth every 6 (six) hours as needed for moderate pain or headache.   amiodarone (PACERONE) 200 MG tablet Take 1 tablet (200 mg total) by mouth 2 (two) times daily for 28 days, THEN 1 tablet (200 mg total) daily.   amLODipine (NORVASC) 2.5 MG tablet Take 1 tablet (2.5 mg total) by mouth daily.   apixaban (ELIQUIS) 5 MG TABS tablet Take 1 tablet (5 mg total) by mouth 2 (two) times daily.   ascorbic acid (VITAMIN C) 500 MG tablet Take 500 mg by mouth daily.   atorvastatin (LIPITOR) 40 MG tablet TAKE 1 TABLET BY MOUTH EVERYDAY AT BEDTIME   cholecalciferol (VITAMIN D3) 25 MCG (1000 UNIT) tablet Take 1,000 Units by mouth daily.   Cyanocobalamin (B-12) 1000 MCG TBCR Take 1,000 mcg by mouth daily.   diazepam (VALIUM) 5 MG tablet Take 5 mg by mouth daily as needed for anxiety.   furosemide (LASIX) 20 MG tablet Take 1 tablet (20 mg total) by mouth daily as needed.   Magnesium 200 MG TABS Take 200 mg by mouth daily.   Menaquinone-7 (K2 PO) Take 1 tablet by mouth daily.   omeprazole (PRILOSEC) 40 MG capsule Take 40 mg by mouth daily.   valsartan (DIOVAN) 40 MG tablet Take 1 tablet (40 mg total) by mouth 2  (two) times daily.   zinc gluconate 50 MG tablet Take 50 mg by mouth daily.     Allergies:   Penicillins, Fosamax [alendronate], Levofloxacin, Mexiletine, Cefaclor, Latex, Metronidazole, Naproxen, Nsaids, Septra [bactrim], Sulfonamide derivatives, and Tape   Social History   Socioeconomic History   Marital status: Married    Spouse name: Harrie Jeans   Number of children: 1   Years of education: Not on file   Highest education level: Not on file  Occupational History   Occupation: retired truck Orthoptist: RETIRED  Tobacco Use   Smoking status: Former    Packs/day: 0.50    Years: 46.00  Total pack years: 23.00    Types: Cigarettes    Quit date: 02/13/2011    Years since quitting: 10.7   Smokeless tobacco: Never  Vaping Use   Vaping Use: Never used  Substance and Sexual Activity   Alcohol use: No   Drug use: No   Sexual activity: Not on file  Other Topics Concern   Not on file  Social History Narrative   Married since 1991, second marriage.    1 son   1 grandson   1 granddaughter   2 step children   3 step grandchildren   Social Determinants of Health   Financial Resource Strain: Low Risk  (05/11/2021)   Overall Financial Resource Strain (CARDIA)    Difficulty of Paying Living Expenses: Not hard at all  Food Insecurity: No Food Insecurity (05/11/2021)   Hunger Vital Sign    Worried About Running Out of Food in the Last Year: Never true    Ran Out of Food in the Last Year: Never true  Transportation Needs: No Transportation Needs (05/11/2021)   PRAPARE - Hydrologist (Medical): No    Lack of Transportation (Non-Medical): No  Physical Activity: Insufficiently Active (05/11/2021)   Exercise Vital Sign    Days of Exercise per Week: 3 days    Minutes of Exercise per Session: 20 min  Stress: Stress Concern Present (05/11/2021)   [place]    Feeling of Stress : To  some extent  Social Connections: Socially Integrated (05/11/2021)   Social Connection and Isolation Panel [NHANES]    Frequency of Communication with Friends and Family: More than three times a week    Frequency of Social Gatherings with Friends and Family: More than three times a week    Attends Religious Services: More than 4 times per year    Active Member of [employer] or Organizations: Yes    Attends Music therapist: More than 4 times per year    Marital Status: Married     Family History: The patient's family history includes Aplastic anemia in an other family member; Arthritis in her mother; Diverticulitis in her sister; Heart disease in her sister and another family member; Pancreatic cancer in her mother.  ROS:   Please see the history of present illness.     All other systems reviewed and are negative.  EKGs/Labs/Other Studies Reviewed:    The following studies were reviewed today:  Echo 05/17/2021 1. Left ventricular ejection fraction, by estimation, is 55 to 60%. The  left ventricle has normal function. The left ventricle has no regional  wall motion abnormalities. There is mild asymmetric left ventricular  hypertrophy of the basal-septal segment.  Left ventricular diastolic parameters were normal.   2. Right ventricular systolic function is normal. The right ventricular  size is normal. There is mildly elevated pulmonary artery systolic  pressure. The estimated right ventricular systolic pressure is 24.0 mmHg.   3. The mitral valve is normal in structure. Mild mitral valve  regurgitation.   4. The aortic valve is tricuspid. There is mild calcification of the  aortic valve. There is mild thickening of the aortic valve. Aortic valve  regurgitation is trivial.   5. The inferior vena cava is normal in size with greater than 50%  respiratory variability, suggesting right atrial pressure of 3 mmHg.   Comparison(s): Compared to prior TTE in 01/2021, there is no  significant  change.   EKG:  EKG is not ordered today.    Recent Labs: 01/13/2021: TSH 0.489 06/17/2021: B Natriuretic Peptide 372.1; Hemoglobin 12.3; Platelets 245 10/26/2021: BUN 31; Creatinine, Ser 1.05; Potassium 4.7; Sodium 140  Recent Lipid Panel    Component Value Date/Time   CHOL 133 07/15/2019 0934   CHOL 124 02/27/2017 0940   TRIG 48 07/15/2019 0934   HDL 59 07/15/2019 0934   HDL 58 02/27/2017 0940   CHOLHDL 2.3 07/15/2019 0934   VLDL 10 07/15/2019 0934   LDLCALC 64 07/15/2019 0934   LDLCALC 61 07/14/2018 1149     Risk Assessment/Calculations:    CHA2DS2-VASc Score = 5   This indicates a 7.2% annual risk of stroke. The patient's score is based upon: CHF History: 0 HTN History: 1 Diabetes History: 0 Stroke History: 0 Vascular Disease History: 1 Age Score: 2 Gender Score: 1          Physical Exam:    VS:  BP (!) 148/82   Pulse (!) 56   Ht '4\' 11"'$  (1.499 m)   Wt 125 lb 6.4 oz (56.9 kg)   SpO2 100%   BMI 25.33 kg/m     Wt Readings from Last 3 Encounters:  11/21/21 125 lb 6.4 oz (56.9 kg)  11/14/21 125 lb 11.2 oz (57 kg)  10/12/21 123 lb (55.8 kg)     GEN:  Well nourished, well developed in no acute distress HEENT: Normal NECK: No JVD; No carotid bruits LYMPHATICS: No lymphadenopathy CARDIAC: RRR, no murmurs, rubs, gallops RESPIRATORY:  Clear to auscultation without rales, wheezing or rhonchi  ABDOMEN: Soft, non-tender, non-distended MUSCULOSKELETAL:  No edema; No deformity  SKIN: Warm and dry NEUROLOGIC:  Alert and oriented x 3 PSYCHIATRIC:  Normal affect   ASSESSMENT:    1. Extremity cyanosis   2. Coronary artery disease involving native coronary artery of native heart without angina pectoris   3. PAF (paroxysmal atrial fibrillation) ([place])   4. Primary hypertension   5. Hyperlipidemia LDL goal <70   6. PVC (premature ventricular contraction)    PLAN:    In order of problems listed above:  Lower extremity cyanosis: Her feet would turn  dark blue and purple upon sitting however improved when she stands.  I could not feel posterior tibial pulse but she has weak dorsalis pedis pulse.  I recommended lower extremity ABI.  I asked her if she has bluish extremity in the winter, however she says the weather does not worsen or improve the discoloration.  It is not clear to me whether or not she has been on this.  I did add 2.5 mg daily of amlodipine for vasodilatory purposes  CAD: Denies any recent chest pain  PAF: On Eliquis and amiodarone  Hypertension: Blood pressure stable  Hyperlipidemia: On Lipitor  History of PVCs: She has high PVC burden.  Previously mexiletine was stopped due to side effects.  Beta-blocker stopped due to bradycardia.            Medication Adjustments/Labs and Tests Ordered: Current medicines are reviewed at length with the patient today.  Concerns regarding medicines are outlined above.  Orders Placed This Encounter  Procedures   VAS [place] LOWER EXTREMITY ARTERIAL DUPLEX   VAS [place] ABI WITH/WO TBI   Meds ordered this encounter  Medications   amLODipine (NORVASC) 2.5 MG tablet    Sig: Take 1 tablet (2.5 mg total) by mouth daily.    Dispense:  90 tablet    Refill:  3    Patient Instructions  Medication Instructions:  START Amlodipine 2.5 mg daily  *If you need a refill on your cardiac medications before your next appointment, please call your pharmacy*  Lab Work: NONE ordered at this time of appointment   If you have labs (blood work) drawn today and your tests are completely normal, you will receive your results only by: [place] (if you have MyChart) OR A paper copy in the mail If you have any lab test that is abnormal or we need to change your treatment, we will call you to review the results.  Testing/Procedures: Your physician has requested that you have an ankle brachial index (ABI). During this test an ultrasound and blood pressure cuff are used to evaluate the arteries that  supply the arms and legs with blood. Allow thirty minutes for this exam. There are no restrictions or special instructions. Your physician has requested that you have a lower extremity arterial exercise duplex. During this test, exercise and ultrasound are used to evaluate arterial blood flow in the legs. Allow one hour for this exam. There are no restrictions or special instructions.  Follow-Up: At [facility] you and your health needs are our priority.  As part of our continuing mission to provide you with exceptional heart care, we have created designated Provider Care Teams.  These Care Teams include your primary Cardiologist (physician) and Advanced Practice Providers (APPs -  Physician Assistants and Nurse Practitioners) who all work together to provide you with the care you need, when you need it.   Your next appointment:   As previously scheduled   The format for your next appointment:   In Person  Provider:   [name], MD     Other Instructions   Important Information About Sugar         Signed, Almyra Deforest, [place]  11/23/2021 9:59 PM    [place]

## 2021-11-23 ENCOUNTER — Encounter: Payer: Self-pay | Admitting: Physician Assistant

## 2021-11-28 ENCOUNTER — Encounter: Payer: Self-pay | Admitting: Family Medicine

## 2021-11-28 DIAGNOSIS — Z85828 Personal history of other malignant neoplasm of skin: Secondary | ICD-10-CM | POA: Diagnosis not present

## 2021-11-28 DIAGNOSIS — C44319 Basal cell carcinoma of skin of other parts of face: Secondary | ICD-10-CM | POA: Diagnosis not present

## 2021-11-28 DIAGNOSIS — L570 Actinic keratosis: Secondary | ICD-10-CM | POA: Diagnosis not present

## 2021-11-28 DIAGNOSIS — L821 Other seborrheic keratosis: Secondary | ICD-10-CM | POA: Diagnosis not present

## 2021-11-28 DIAGNOSIS — D1801 Hemangioma of skin and subcutaneous tissue: Secondary | ICD-10-CM | POA: Diagnosis not present

## 2021-11-28 DIAGNOSIS — D225 Melanocytic nevi of trunk: Secondary | ICD-10-CM | POA: Diagnosis not present

## 2021-11-28 DIAGNOSIS — C4441 Basal cell carcinoma of skin of scalp and neck: Secondary | ICD-10-CM | POA: Diagnosis not present

## 2021-12-06 DIAGNOSIS — Z124 Encounter for screening for malignant neoplasm of cervix: Secondary | ICD-10-CM | POA: Diagnosis not present

## 2021-12-06 DIAGNOSIS — Z6826 Body mass index (BMI) 26.0-26.9, adult: Secondary | ICD-10-CM | POA: Diagnosis not present

## 2021-12-07 ENCOUNTER — Ambulatory Visit (HOSPITAL_COMMUNITY)
Admission: RE | Admit: 2021-12-07 | Discharge: 2021-12-07 | Disposition: A | Discharge status: Home / Self Care | Payer: Medicare Other | Source: Ambulatory Visit | Attending: Physician Assistant | Admitting: Physician Assistant

## 2021-12-07 DIAGNOSIS — R230 Cyanosis: Secondary | ICD-10-CM | POA: Insufficient documentation

## 2021-12-07 DIAGNOSIS — I771 Stricture of artery: Secondary | ICD-10-CM

## 2021-12-15 DIAGNOSIS — H524 Presbyopia: Secondary | ICD-10-CM | POA: Diagnosis not present

## 2021-12-15 DIAGNOSIS — H353131 Nonexudative age-related macular degeneration, bilateral, early dry stage: Secondary | ICD-10-CM | POA: Diagnosis not present

## 2021-12-15 DIAGNOSIS — H43393 Other vitreous opacities, bilateral: Secondary | ICD-10-CM | POA: Diagnosis not present

## 2021-12-15 DIAGNOSIS — H35363 Drusen (degenerative) of macula, bilateral: Secondary | ICD-10-CM | POA: Diagnosis not present

## 2021-12-15 DIAGNOSIS — H5201 Hypermetropia, right eye: Secondary | ICD-10-CM | POA: Diagnosis not present

## 2021-12-15 DIAGNOSIS — H52222 Regular astigmatism, left eye: Secondary | ICD-10-CM | POA: Diagnosis not present

## 2021-12-15 DIAGNOSIS — H2513 Age-related nuclear cataract, bilateral: Secondary | ICD-10-CM | POA: Diagnosis not present

## 2021-12-15 DIAGNOSIS — H25813 Combined forms of age-related cataract, bilateral: Secondary | ICD-10-CM | POA: Diagnosis not present

## 2021-12-20 ENCOUNTER — Encounter: Payer: Self-pay | Admitting: Cardiovascular Disease

## 2021-12-20 ENCOUNTER — Ambulatory Visit (INDEPENDENT_AMBULATORY_CARE_PROVIDER_SITE_OTHER): Payer: Medicare Other | Admitting: Cardiovascular Disease

## 2021-12-20 DIAGNOSIS — L738 Other specified follicular disorders: Secondary | ICD-10-CM | POA: Diagnosis not present

## 2021-12-20 DIAGNOSIS — I10 Essential (primary) hypertension: Secondary | ICD-10-CM | POA: Diagnosis not present

## 2021-12-20 DIAGNOSIS — D1801 Hemangioma of skin and subcutaneous tissue: Secondary | ICD-10-CM | POA: Diagnosis not present

## 2021-12-20 DIAGNOSIS — Z85828 Personal history of other malignant neoplasm of skin: Secondary | ICD-10-CM | POA: Diagnosis not present

## 2021-12-20 DIAGNOSIS — I6523 Occlusion and stenosis of bilateral carotid arteries: Secondary | ICD-10-CM | POA: Diagnosis not present

## 2021-12-20 DIAGNOSIS — I2510 Atherosclerotic heart disease of native coronary artery without angina pectoris: Secondary | ICD-10-CM | POA: Diagnosis not present

## 2021-12-20 DIAGNOSIS — I4891 Unspecified atrial fibrillation: Secondary | ICD-10-CM | POA: Diagnosis not present

## 2021-12-20 DIAGNOSIS — I739 Peripheral vascular disease, unspecified: Secondary | ICD-10-CM | POA: Insufficient documentation

## 2021-12-20 DIAGNOSIS — E782 Mixed hyperlipidemia: Secondary | ICD-10-CM

## 2021-12-20 DIAGNOSIS — I493 Ventricular premature depolarization: Secondary | ICD-10-CM | POA: Diagnosis not present

## 2021-12-20 DIAGNOSIS — L821 Other seborrheic keratosis: Secondary | ICD-10-CM | POA: Diagnosis not present

## 2021-12-20 NOTE — Patient Instructions (Signed)
Medication Instructions:  Your physician recommends that you continue on your current medications as directed. Please refer to the Current Medication list given to you today.  *If you need a refill on your cardiac medications before your next appointment, please call your pharmacy*   Testing/Procedures: Your physician has requested that you have a lower extremity arterial duplex. This test is an ultrasound of the arteries in the legs. It looks at arterial blood flow in the legs. Allow one hour for Lower Arterial scans. There are no restrictions or special instructions  Your physician has requested that you have an ankle brachial index (ABI). During this test an ultrasound and blood pressure cuff are used to evaluate the arteries that supply the arms and legs with blood. Allow thirty minutes for this exam. There are no restrictions or special instructions. To be done 1-2 weeks after procedure (8/28). These will be done at [facility]   Follow-Up: At [facility], you and your health needs are our priority.  As part of our continuing mission to provide you with exceptional heart care, we have created designated Provider Care Teams.  These Care Teams include your primary Cardiologist (physician) and Advanced Practice Providers (APPs -  Physician Assistants and Nurse Practitioners) who all work together to provide you with the care you need, when you need it.  We recommend signing up for the patient portal called "MyChart".  Sign up information is provided on this After Visit Summary.  MyChart is used to connect with patients for Virtual Visits (Telemedicine).  Patients are able to view lab/test results, encounter notes, upcoming appointments, etc.  Non-urgent messages can be sent to your provider as well.   To learn more about what you can do with MyChart, go to [email or web address].    Your next appointment:   4 week(s)  The format for your next appointment:   In  Person  Provider:   Almyra Deforest, PA-C (PV slot)   Other Instructions  [address] [place] [address] Dept: [phone] Loc: [place]  12/20/2021  You are scheduled for a Peripheral Angiogram on Monday, August 28 with Dr. [name].  1. Please arrive at the Main Entrance A at [facility]: [place], [place] [address] at 10:00 AM (This time is two hours before your procedure to ensure your preparation). Free valet parking service is available.   Special note: Every effort is made to have your procedure done on time. Please understand that emergencies sometimes delay scheduled procedures.  2. Diet: Do not eat solid foods after midnight.  You may have clear liquids until 5 AM upon the day of the procedure.  3. Labs: You will need to have blood drawn at appointment with [facility].  4. Medication instructions in preparation for your procedure:    Stop taking Eliquis (Apixiban) on Saturday, August 26.  Stop taking, Lasix (Furosemide)  Monday, August 28,  On the morning of your procedure, take Aspirin and any morning medicines NOT listed above.  You may use sips of water.  5. Plan to go home the same day, you will only stay overnight if medically necessary. 6. You MUST have a responsible adult to drive you home. 7. An adult MUST be with you the first 24 hours after you arrive home. 8. Bring a current list of your medications, and the last time and date medication taken. 9.  Bring ID and current insurance cards. 10.Please wear clothes that are easy to get on and off and wear slip-on shoes.  Thank you for allowing [place] to care for you!   -- [facility]

## 2021-12-20 NOTE — Assessment & Plan Note (Signed)
History of hyperlipidemia on statin therapy lipid profile performed 07/15/2019 revealing total cholesterol 133, LDL 64 and HDL 59.

## 2021-12-20 NOTE — Progress Notes (Signed)
12/20/2021 Brittany Kirk   [birth year]  [id number]  Primary Physician Pickard, Cammie Mcgee, MD Primary Cardiologist: Lorretta Harp MD [name], [place]  HPI:  Brittany Kirk is a 76 y.o.  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 She was referred by Dr. Dennard Schaumann for cardiovascular evaluation because of chest pain. I last saw her in the office 08/31/2021.  Risk factors include 25 pack years of tobacco abuse having quit 3 years ago. She has treated hypertension as well as a strong family history of heart disease with multiple siblings who died at a premature age of myocardial infarction's. She has never had a heart attack or stroke. She has had a remote cardiac catheterization by Dr. Lia Foyer but no intervention was performed. She was complaining of some substernal chest pain with chronic nausea. This has since some sided after beginning on a proton pump inhibitor. She had a negative stress test 01/24/16 and again during a recent consultation for chest pain 01/05/17. She has had upper and lower endoscopy for abdominal pain and nausea which was unrevealing.   She had diagnostic coronary angiography performed by Dr. Martinique 5/19 showing moderately severe ramus branch ostial disease with noncritical disease otherwise a normal LV function.  This was unchanged from prior cath performed in 2010 it was thought that her chest pain was noncardiac in nature.   She was seen in the emergency room on 07/15/2019 for A. fib with RVR.  She converted spontaneously to sinus rhythm and was begun on apixaban by Dr. Harrell Gave.  She has had a few episodes of brief AF since but for the most part is asymptomatic from this.   She was admitted in early August with A. fib with RVR and converted.  Her troponins were mildly elevated.  Her 2D echo was normal.  She has had no recurrent episodes.  She she saw Dr.  Curt Bears for EP follow-up who has scheduled her for A. fib ablation on 04/14/2021.  She saw Dr. Curt Bears because of symptomatic PVCs who began her on mexiletine.  Unfortunately, this is exacerbated her baseline tremor.  Otherwise she has remained stable.  She also complains of reflux type symptoms on Prilosec.  She still sees Dr. Lizbeth Bark  a gastroenterologist for this.  Because of her intolerance to mexiletine she saw Dr. Curt Bears again who began amiodarone.  He loaded her for 3 weeks and began her on 200 mg a day.  Since that time she has been symptomatically bradycardic.  She did see Almyra Deforest PA-C in the office 11/23/2021 complaining of dependent cyanosis and weakness in her legs.  Doppler studies performed 12/07/2021 revealed right ABI of 0.78 and left of 0.75 with high-frequency signals of both iliac arteries.   Current Meds  Medication Sig   acetaminophen (TYLENOL) 500 MG tablet Take 1,000 mg by mouth every 6 (six) hours as needed for moderate pain or headache.   amiodarone (PACERONE) 200 MG tablet Take 1 tablet (200 mg total) by mouth 2 (two) times daily for 28 days, THEN 1 tablet (200 mg total) daily.   amLODipine (NORVASC) 2.5 MG tablet Take 1 tablet (2.5 mg total) by mouth daily.   apixaban (ELIQUIS) 5 MG TABS tablet Take 1 tablet (5 mg total) by mouth 2 (two) times daily.   ascorbic acid (VITAMIN C) 500 MG tablet Take 500 mg by mouth daily.  atorvastatin (LIPITOR) 40 MG tablet TAKE 1 TABLET BY MOUTH EVERYDAY AT BEDTIME   cholecalciferol (VITAMIN D3) 25 MCG (1000 UNIT) tablet Take 1,000 Units by mouth daily.   Cyanocobalamin (B-12) 1000 MCG TBCR Take 1,000 mcg by mouth daily.   diazepam (VALIUM) 5 MG tablet Take 5 mg by mouth daily as needed for anxiety.   furosemide (LASIX) 20 MG tablet Take 1 tablet (20 mg total) by mouth daily as needed.   Magnesium 200 MG TABS Take 200 mg by mouth daily.   Menaquinone-7 (K2 PO) Take 1 tablet by mouth daily.   omeprazole (PRILOSEC) 40 MG capsule Take  40 mg by mouth daily.   valsartan (DIOVAN) 40 MG tablet Take 1 tablet (40 mg total) by mouth 2 (two) times daily.     Allergies  Allergen Reactions   Penicillins Anaphylaxis, Itching, Swelling and Rash    Has patient had a PCN reaction causing immediate rash, facial/tongue/throat swelling, SOB or lightheadedness with hypotension: Yes Has patient had a PCN reaction causing severe rash involving mucus membranes or skin necrosis: No Has patient had a PCN reaction that required hospitalization: Yes Has patient had a PCN reaction occurring within the last 10 years: No If all of the above answers are "NO", then may proceed with Cephalosporin use.    Fosamax [Alendronate]     myalgias   Levofloxacin Nausea Only and Other (See Comments)    Causes headache, fatigue and dizziness   Mexiletine Itching, Nausea Only and Other (See Comments)   Cefaclor Itching, Swelling and Rash   Latex Itching and Rash   Metronidazole Itching, Swelling and Rash   Naproxen Itching, Swelling and Rash   Nsaids Itching, Swelling and Rash    Ibuprofen IS tolerated   Septra [Bactrim] Itching, Swelling and Rash   Sulfonamide Derivatives Itching, Swelling and Rash   Tape Itching and Rash    ONLY USE PAPER TAPE    Social History   Socioeconomic History   Marital status: Married    Spouse name: Psychologist, prison and probation services   Number of children: 1   Years of education: Not on file   Highest education level: Not on file  Occupational History   Occupation: retired truck Orthoptist: RETIRED  Tobacco Use   Smoking status: Former    Packs/day: 0.50    Years: 46.00    Total pack years: 23.00    Types: Cigarettes    Quit date: 02/13/2011    Years since quitting: 10.8   Smokeless tobacco: Never  Vaping Use   Vaping Use: Never used  Substance and Sexual Activity   Alcohol use: No   Drug use: No   Sexual activity: Not on file  Other Topics Concern   Not on file  Social History Narrative   Married since 1991, second  marriage.    1 son   1 grandson   1 granddaughter   2 step children   3 step grandchildren   Social Determinants of Health   Financial Resource Strain: Low Risk  (05/11/2021)   Overall Financial Resource Strain (CARDIA)    Difficulty of Paying Living Expenses: Not hard at all  Food Insecurity: No Food Insecurity (05/11/2021)   Hunger Vital Sign    Worried About Running Out of Food in the Last Year: Never true    Ran Out of Food in the Last Year: Never true  Transportation Needs: No Transportation Needs (05/11/2021)   PRAPARE - Hydrologist (  Medical): No    Lack of Transportation (Non-Medical): No  Physical Activity: Insufficiently Active (05/11/2021)   Exercise Vital Sign    Days of Exercise per Week: 3 days    Minutes of Exercise per Session: 20 min  Stress: Stress Concern Present (05/11/2021)   [place]    Feeling of Stress : To some extent  Social Connections: Socially Integrated (05/11/2021)   Social Connection and Isolation Panel [NHANES]    Frequency of Communication with Friends and Family: More than three times a week    Frequency of Social Gatherings with Friends and Family: More than three times a week    Attends Religious Services: More than 4 times per year    Active Member of [employer] or Organizations: Yes    Attends Music therapist: More than 4 times per year    Marital Status: Married  Human resources officer Violence: Not At Risk (05/11/2021)   Humiliation, Afraid, Rape, and Kick questionnaire    Fear of Current or Ex-Partner: No    Emotionally Abused: No    Physically Abused: No    Sexually Abused: No     Review of Systems: General: negative for chills, fever, night sweats or weight changes.  Cardiovascular: negative for chest pain, dyspnea on exertion, edema, orthopnea, palpitations, paroxysmal nocturnal dyspnea or shortness of breath Dermatological: negative  for rash Respiratory: negative for cough or wheezing Urologic: negative for hematuria Abdominal: negative for nausea, vomiting, diarrhea, bright red blood per rectum, melena, or hematemesis Neurologic: negative for visual changes, syncope, or dizziness All other systems reviewed and are otherwise negative except as noted above.    Blood pressure (!) 148/50, pulse (!) 47, height '4\' 11"'$  (1.499 m), weight 125 lb (56.7 kg).  General appearance: alert and no distress Neck: no adenopathy, no JVD, supple, symmetrical, trachea midline, thyroid not enlarged, symmetric, no tenderness/mass/nodules, and loud bilateral carotid bruits Lungs: clear to auscultation bilaterally Heart: regular rate and rhythm, S1, S2 normal, no murmur, click, rub or gallop Extremities: extremities normal, atraumatic, no cyanosis or edema Pulses: 2+ and symmetric Skin: Bilateral lower extremity cyanosis Neurologic: Grossly normal  EKG not performed today  ASSESSMENT AND PLAN:   CAD in native artery History of CAD status post cath performed Dr. Martinique 5/19 revealing a moderately severe ramus branch stenosis unchanged from prior cath performed Dr. Lia Foyer 2010.  She denies chest pain.  Medical therapy was recommended.  Hyperlipidemia History of hyperlipidemia on statin therapy lipid profile performed 07/15/2019 revealing total cholesterol 133, LDL 64 and HDL 59.  Essential hypertension History of essential hypertension blood pressure measured today at 148/50.  She is on amlodipine and valsartan.  Atrial fibrillation with RVR (HCC) History of atrial fibrillation status post ablation by Dr. Curt Bears 11//22 maintaining sinus rhythm on Eliquis oral anticoagulation.  Asymptomatic bilateral carotid artery stenosis History of moderate bilateral ICA stenosis by duplex ultrasound performed 02/28/2021.  We will repeat this this coming September.  PVC's (premature ventricular contractions) History of symptomatic PVCs with 17%  burden intolerant to mexiletine.  She saw Dr. Curt Bears who loaded her with amiodarone.  She is currently on 200 mg a day with a heart rate in the 40s.  She feels weak and washed out.  I suspect that her heart rate is too slow and question whether or not she needs alternative medical therapy or permanent transvenous pacing.  Her PVCs of her heartbeat have markedly improved.  Peripheral arterial disease ([place]) History of  bilateral lower extremity cyanosis with symptoms compatible with claudication.  She had Doppler studies performed in our office 12/07/2021 revealing a right ABI of 0.78 and left of 0.75 with high-grade bilateral common iliac artery stenoses.  She wishes to proceed with outpatient peripheral angiography and endovascular therapy.  This will be planned sometime in late August.  She will need to stop her Eliquis 2 days prior to the procedure.     Lorretta Harp MD FACP,FACC,FAHA, [facility] 12/20/2021 3:01 PM

## 2021-12-20 NOTE — Assessment & Plan Note (Signed)
History of essential hypertension blood pressure measured today at 148/50.  She is on amlodipine and valsartan.

## 2021-12-20 NOTE — Assessment & Plan Note (Signed)
History of CAD status post cath performed Dr. Martinique 5/19 revealing a moderately severe ramus branch stenosis unchanged from prior cath performed Dr. Lia Foyer 2010.  She denies chest pain.  Medical therapy was recommended.

## 2021-12-20 NOTE — Assessment & Plan Note (Signed)
History of symptomatic PVCs with 17% burden intolerant to mexiletine.  She saw Dr. Curt Bears who loaded her with amiodarone.  She is currently on 200 mg a day with a heart rate in the 40s.  She feels weak and washed out.  I suspect that her heart rate is too slow and question whether or not she needs alternative medical therapy or permanent transvenous pacing.  Her PVCs of her heartbeat have markedly improved.

## 2021-12-20 NOTE — Progress Notes (Unsigned)
Cardiology Office Note Date:  12/20/2021  Patient ID:  Brittany Kirk, DOB [birth year], MRN [id number] PCP:  Susy Frizzle, MD  Cardiologist:  Dr. Gwenlyn Found Electrophysiologist: Dr. Curt Bears  ***refresh   Chief Complaint: *** 3 mo  History of Present Illness: Brittany Kirk is a 76 y.o. female with history of CAD (diagnostic coronary angiography performed by Dr. Martinique 5/19 showing moderately severe ramus branch ostial disease with noncritical disease otherwise a normal LV function.  This was unchanged from prior cath performed in 2010), HTN, HLD, essential tremor, AFib, PVCs  She saw Dr. Curt Bears 08/08/21, AFib well controlled though having symptomatic PVCs (17%) and started on mexiletine.  She saw Dr. Gwenlyn Found 08/31/21, felt her tremor was worse on mexiletine and stopped with plans to refer back to Dr. Curt Bears for alternative management options, no other changes made.  She saw Dr. Curt Bears 09/19/21, again with symptomatic PVCs off the mexiletine.  Felt a poor ablation candidate given fraility, started on amiodarone '200mg'$  BID for a month > daily.  Given a few days of lasix for edema.  Following with [facility] team for her labile  BPs/HTN,  During these visits her metoprolol stopped for HRs 40s and fatigue (April), lasix resumed,  BP improved as well as fatigue, taking her lasix prn, further adjustments in ARB at subsequent visits.  Discussed what sounds like dependent rubor  She saw H. Eulas Post, PA-C 11/21/21, poor pedal pulses noted, added amlodipine for vasodilation and planned for ABIs Studies noted mod vascular disease b/l, recommended that she see Dr. Gwenlyn Found for further management/recommendations  Saw dr. Gwenlyn Found 12/20/21, ***  *** EKG *** repeat monitor for burden *** amio labs *** symptoms *** eliquis, bleeding, dosed well   AFib/AAD hx Diagnosed Feb 2021 PVI ablation 04/14/21 Mexiletine started for PVCs started Feb 2023 > stopped March 2023 with increase in her tremor Amiodarone started  April 2023   Past Medical History:  Diagnosis Date   Acid reflux    Allergic rhinitis    Anxiety    Asymptomatic bilateral carotid artery stenosis    40-59% (02/2018)   Atrial fibrillation ([place]) 07/2019   Breast cancer ([place]) 2015   Right Breast Cancer   CAD (coronary artery disease)    Depression    Emphysema    no longer uses inhalers   Gallstones    nausea, pain upper right abdomen   GERD (gastroesophageal reflux disease)    H/O hiatal hernia    Hiatal hernia    History of skin cancer    Hyperlipidemia    Hypertension    Multinodular goiter (nontoxic)    Scoliosis    Skin cancer    Tobacco user    Wears dentures    top    Past Surgical History:  Procedure Laterality Date   ATRIAL FIBRILLATION ABLATION N/A 04/14/2021   Procedure: ATRIAL FIBRILLATION ABLATION;  Surgeon: Constance Haw, MD;  Location: [facility];  Service: Cardiovascular;  Laterality: N/A;   BREAST LUMPECTOMY Right 2015   CHOLECYSTECTOMY  07/08/2012   Procedure: LAPAROSCOPIC CHOLECYSTECTOMY WITH INTRAOPERATIVE CHOLANGIOGRAM;  Surgeon: Gayland Curry, MD,FACS;  Location: [facility];  Service: General;  Laterality: N/A;  Laparoscopic Cholecystectomy with Intraoperative Cholangiogram   COLONOSCOPY     ESOPHAGOSCOPY N/A 05/29/2017   Procedure: ESOPHAGOSCOPY;  Surgeon: Clarene Essex, MD;  Location: [facility];  Service: Endoscopy;  Laterality: N/A;  botox injection   HAND SURGERY Left    wrist   LEFT HEART CATH AND CORONARY ANGIOGRAPHY  N/A 10/11/2017   Procedure: LEFT HEART CATH AND CORONARY ANGIOGRAPHY;  Surgeon: Martinique, Peter M, MD;  Location: [facility];  Service: Cardiovascular;  Laterality: N/A;   TONSILECTOMY, ADENOIDECTOMY, BILATERAL MYRINGOTOMY AND TUBES     TUBAL LIGATION      Current Outpatient Medications  Medication Sig Dispense Refill   acetaminophen (TYLENOL) 500 MG tablet Take 1,000 mg by mouth every 6 (six) hours as needed for moderate pain or headache.     amiodarone  (PACERONE) 200 MG tablet Take 1 tablet (200 mg total) by mouth 2 (two) times daily for 28 days, THEN 1 tablet (200 mg total) daily. 90 tablet 3   amLODipine (NORVASC) 2.5 MG tablet Take 1 tablet (2.5 mg total) by mouth daily. 90 tablet 3   apixaban (ELIQUIS) 5 MG TABS tablet Take 1 tablet (5 mg total) by mouth 2 (two) times daily. 60 tablet 6   ascorbic acid (VITAMIN C) 500 MG tablet Take 500 mg by mouth daily.     atorvastatin (LIPITOR) 40 MG tablet TAKE 1 TABLET BY MOUTH EVERYDAY AT BEDTIME 90 tablet 2   cholecalciferol (VITAMIN D3) 25 MCG (1000 UNIT) tablet Take 1,000 Units by mouth daily.     Cyanocobalamin (B-12) 1000 MCG TBCR Take 1,000 mcg by mouth daily.     diazepam (VALIUM) 5 MG tablet Take 5 mg by mouth daily as needed for anxiety.     furosemide (LASIX) 20 MG tablet Take 1 tablet (20 mg total) by mouth daily as needed. 30 tablet 2   Magnesium 200 MG TABS Take 200 mg by mouth daily.     Menaquinone-7 (K2 PO) Take 1 tablet by mouth daily.     omeprazole (PRILOSEC) 40 MG capsule Take 40 mg by mouth daily.     valsartan (DIOVAN) 40 MG tablet Take 1 tablet (40 mg total) by mouth 2 (two) times daily. 60 tablet 2   zinc gluconate 50 MG tablet Take 50 mg by mouth daily.     No current facility-administered medications for this visit.    Allergies:   Penicillins, Fosamax [alendronate], Levofloxacin, Mexiletine, Cefaclor, Latex, Metronidazole, Naproxen, Nsaids, Septra [bactrim], Sulfonamide derivatives, and Tape   Social History:  The patient  reports that she quit smoking about 10 years ago. Her smoking use included cigarettes. She has a 23.00 pack-year smoking history. She has never used smokeless tobacco. She reports that she does not drink alcohol and does not use drugs.   Family History:  The patient's family history includes Aplastic anemia in an other family member; Arthritis in her mother; Diverticulitis in her sister; Heart disease in her sister and another family member; Pancreatic  cancer in her mother.  ROS:  Please see the history of present illness.    All other systems are reviewed and otherwise negative.   PHYSICAL EXAM:  VS:  There were no vitals taken for this visit. BMI: There is no height or weight on file to calculate BMI. Well nourished, well developed, in no acute distress HEENT: normocephalic, atraumatic Neck: no JVD, carotid bruits or masses Cardiac:  *** RRR; no significant murmurs, no rubs, or gallops Lungs:  *** CTA b/l, no wheezing, rhonchi or rales Abd: soft, nontender MS: no deformity or *** atrophy Ext: *** no edema Skin: warm and dry, no rash Neuro:  No gross deficits appreciated Psych: euthymic mood, full affect   EKG:  Done today and reviewed by myself shows  ***  12/07/21: LE ABIs Summary:  Right: Resting  right ankle-brachial index indicates moderate right lower  extremity arterial disease. The right toe-brachial index is abnormal.   Left: Resting left ankle-brachial index indicates moderate left lower  extremity arterial disease. The left toe-brachial index is abnormal.   05/17/2021: TTE 1. Left ventricular ejection fraction, by estimation, is 55 to 60%. The  left ventricle has normal function. The left ventricle has no regional  wall motion abnormalities. There is mild asymmetric left ventricular  hypertrophy of the basal-septal segment.  Left ventricular diastolic parameters were normal.   2. Right ventricular systolic function is normal. The right ventricular  size is normal. There is mildly elevated pulmonary artery systolic  pressure. The estimated right ventricular systolic pressure is 85.6 mmHg.   3. The mitral valve is normal in structure. Mild mitral valve  regurgitation.   4. The aortic valve is tricuspid. There is mild calcification of the  aortic valve. There is mild thickening of the aortic valve. Aortic valve  regurgitation is trivial.   5. The inferior vena cava is normal in size with greater than 50%   respiratory variability, suggesting right atrial pressure of 3 mmHg.   Comparison(s): Compared to prior TTE in 01/2021, there is no significant  change.   04/14/21: EPS/ablation CONCLUSIONS: 1. Sinus rhythm upon presentation.   2. Successful electrical isolation and anatomical encircling of all four pulmonary veins with radiofrequency current. 3. No inducible arrhythmias following ablation both on and off of dobutamine 4. No early apparent complications.  Recent Labs: 01/13/2021: TSH 0.489 06/17/2021: B Natriuretic Peptide 372.1; Hemoglobin 12.3; Platelets 245 10/26/2021: BUN 31; Creatinine, Ser 1.05; Potassium 4.7; Sodium 140  No results found for requested labs within last 365 days.   CrCl cannot be calculated (Patient's most recent lab result is older than the maximum 21 days allowed.).   Wt Readings from Last 3 Encounters:  11/21/21 125 lb 6.4 oz (56.9 kg)  11/14/21 125 lb 11.2 oz (57 kg)  10/12/21 123 lb (55.8 kg)     Other studies reviewed: Additional studies/records reviewed today include: summarized above  ASSESSMENT AND PLAN:  Paroxysmal AFib CHA2DS2Vasc is 5, on Eliquis, appropriately dosed *** burden  PVCs ***  Disposition: F/u with ***  Current medicines are reviewed at length with the patient today.  The patient did not have any concerns regarding medicines.  Venetia Night, PA-C 12/20/2021 7:47 AM     [address] [place] [address] [phone] (office)  [phone] (fax)

## 2021-12-20 NOTE — Assessment & Plan Note (Signed)
History of atrial fibrillation status post ablation by Dr. Curt Bears 11//22 maintaining sinus rhythm on Eliquis oral anticoagulation.

## 2021-12-20 NOTE — Assessment & Plan Note (Signed)
History of moderate bilateral ICA stenosis by duplex ultrasound performed 02/28/2021.  We will repeat this this coming September.

## 2021-12-20 NOTE — Assessment & Plan Note (Signed)
History of bilateral lower extremity cyanosis with symptoms compatible with claudication.  She had Doppler studies performed in our office 12/07/2021 revealing a right ABI of 0.78 and left of 0.75 with high-grade bilateral common iliac artery stenoses.  She wishes to proceed with outpatient peripheral angiography and endovascular therapy.  This will be planned sometime in late August.  She will need to stop her Eliquis 2 days prior to the procedure.

## 2021-12-21 ENCOUNTER — Encounter: Payer: Self-pay | Admitting: Physician Assistant

## 2021-12-21 ENCOUNTER — Ambulatory Visit (INDEPENDENT_AMBULATORY_CARE_PROVIDER_SITE_OTHER): Payer: Medicare Other | Admitting: Physician Assistant

## 2021-12-21 VITALS — BP 116/60 | HR 46 | Ht 59.0 in | Wt 126.0 lb

## 2021-12-21 DIAGNOSIS — I495 Sick sinus syndrome: Secondary | ICD-10-CM | POA: Diagnosis not present

## 2021-12-21 DIAGNOSIS — I493 Ventricular premature depolarization: Secondary | ICD-10-CM

## 2021-12-21 DIAGNOSIS — I6523 Occlusion and stenosis of bilateral carotid arteries: Secondary | ICD-10-CM

## 2021-12-21 DIAGNOSIS — I480 Paroxysmal atrial fibrillation: Secondary | ICD-10-CM

## 2021-12-21 DIAGNOSIS — I4891 Unspecified atrial fibrillation: Secondary | ICD-10-CM | POA: Diagnosis not present

## 2021-12-21 MED ORDER — AMIODARONE HCL 200 MG PO TABS: 100.0000 mg | ORAL_TABLET | Freq: Every day | ORAL | 3 refills | Status: DC

## 2021-12-21 NOTE — Patient Instructions (Signed)
Medication Instructions:  Your physician has recommended you make the following change in your medication:   DECREASE: Amiodarone '100mg'$  daily  *If you need a refill on your cardiac medications before your next appointment, please call your pharmacy*   Lab Work: TODAY: CMET, TSH  If you have labs (blood work) drawn today and your tests are completely normal, you will receive your results only by: [place] (if you have MyChart) OR A paper copy in the mail If you have any lab test that is abnormal or we need to change your treatment, we will call you to review the results.   Follow-Up: At [facility], you and your health needs are our priority.  As part of our continuing mission to provide you with exceptional heart care, we have created designated Provider Care Teams.  These Care Teams include your primary Cardiologist (physician) and Advanced Practice Providers (APPs -  Physician Assistants and Nurse Practitioners) who all work together to provide you with the care you need, when you need it.   Your next appointment:   TELE Visit 01/04/2022 @ 10:30

## 2021-12-22 LAB — TSH: TSH: 0.652 u[IU]/mL (ref 0.450–4.500)

## 2021-12-22 LAB — COMPREHENSIVE METABOLIC PANEL
ALT: 10 IU/L (ref 0–32)
AST: 16 IU/L (ref 0–40)
Albumin/Globulin Ratio: 1.9 (ref 1.2–2.2)
Albumin: 3.9 g/dL (ref 3.8–4.8)
Alkaline Phosphatase: 53 IU/L (ref 44–121)
BUN/Creatinine Ratio: 28 (ref 12–28)
BUN: 34 mg/dL — ABNORMAL HIGH (ref 8–27)
Bilirubin Total: 0.3 mg/dL (ref 0.0–1.2)
CO2: 22 mmol/L (ref 20–29)
Calcium: 9.1 mg/dL (ref 8.7–10.3)
Chloride: 104 mmol/L (ref 96–106)
Creatinine, Ser: 1.23 mg/dL — ABNORMAL HIGH (ref 0.57–1.00)
Globulin, Total: 2.1 g/dL (ref 1.5–4.5)
Glucose: 93 mg/dL (ref 70–99)
Potassium: 4.7 mmol/L (ref 3.5–5.2)
Sodium: 139 mmol/L (ref 134–144)
Total Protein: 6 g/dL (ref 6.0–8.5)
eGFR: 45 mL/min/{1.73_m2} — ABNORMAL LOW (ref >59)

## 2021-12-22 NOTE — Addendum Note (Signed)
Addended by: Janan Halter F on: 12/22/2021 02:20 PM   Modules accepted: Orders

## 2021-12-25 ENCOUNTER — Other Ambulatory Visit: Payer: Self-pay

## 2021-12-25 DIAGNOSIS — I10 Essential (primary) hypertension: Secondary | ICD-10-CM

## 2021-12-25 MED ORDER — VALSARTAN 40 MG PO TABS: 40.0000 mg | ORAL_TABLET | Freq: Every day | ORAL | 2 refills | Status: DC

## 2022-01-02 ENCOUNTER — Other Ambulatory Visit: Payer: Self-pay | Admitting: Cardiovascular Disease

## 2022-01-03 ENCOUNTER — Other Ambulatory Visit: Payer: Self-pay

## 2022-01-03 DIAGNOSIS — I739 Peripheral vascular disease, unspecified: Secondary | ICD-10-CM

## 2022-01-03 MED ORDER — SODIUM CHLORIDE 0.9% FLUSH: 3.0000 mL | Freq: Two times a day (BID) | INTRAVENOUS | Status: DC

## 2022-01-03 NOTE — Progress Notes (Unsigned)
Virtual Visit via Telephone Note   Because of Brittany Kirk's co-morbid illnesses, she is at least at moderate risk for complications without adequate follow up.  This format is felt to be most appropriate for this patient at this time.  The patient did not have access to video technology/had technical difficulties with video requiring transitioning to audio format only (telephone).  All issues noted in this document were discussed and addressed.  No physical exam could be performed with this format.  Please refer to the patient's chart for her consent to telehealth for [facility].    Date:  01/04/2022   ID:  Brittany Kirk, DOB Nov 15, 1945, MRN [id number] The patient was identified using 2 identifiers.  Patient Location: Home Provider Location: Office/Clinic   PCP:  Susy Frizzle, MD   [facility] Prov[facility] Cardiologist:  [name], MD Electrophysiologist:  Constance Haw, MD {  Evaluation Performed:  Follow-Up Visit  Chief Complaint:   fatigue, bradycardia  History of Present Illness:    Brittany Kirk is a 76 y.o. female with history of CAD (diagnostic coronary angiography performed by Dr. Martinique 5/19 showing moderately severe ramus branch ostial disease with noncritical disease otherwise a normal LV function.  This was unchanged from prior cath performed in 2010), HTN, HLD, essential tremor, AFib, PVCs   She saw Dr. Curt Bears 08/08/21, AFib well controlled though having symptomatic PVCs (17%) and started on mexiletine.   She saw Dr. Gwenlyn Found 08/31/21, felt her tremor was worse on mexiletine and stopped with plans to refer back to Dr. Curt Bears for alternative management options, no other changes made.   She saw Dr. Curt Bears 09/19/21, again with symptomatic PVCs off the mexiletine.  Felt a poor ablation candidate given fraility, started on amiodarone '200mg'$  BID for a month > daily.  Given a few days of lasix for edema.   Following with [facility] team for her  labile  BPs/HTN,  During these visits her metoprolol stopped for HRs 40s and fatigue (April), lasix resumed,  BP improved as well as fatigue, taking her lasix prn, further adjustments in ARB at subsequent visits.  Discussed what sounds like dependent rubor   She saw H. Meng, PA-C 11/21/21, poor pedal pulses noted, added amlodipine for vasodilation and planned for ABIs Studies noted mod vascular disease b/l, recommended that she see Dr. Gwenlyn Found for further management/recommendations   Saw dr. Gwenlyn Found 12/20/21, pending peripheral angiography possible intervention 02/05/22   I saw her 12/21/21 She is accompanied by her husband. She has not felt well for a couple years, tired, no energy, this has gotten worse, markedly so in the last 5-6 months. She is a bit emotional, very tired of feeling tired, all she does these days is go to doctors. It is all she can do to barely keep her house clean. When her BP is low she feels lightheaded. She does though say that she no longer feels the PVCs, palpitations. Reports her HR has been 40's even before the addition of any medicines, including amiodarone. No near syncope or syncope. With ambulation she does have claudication, and SOB, not sure typically which happens 1st No CP We walked today, from [facility] to check out and back, her legs were getting painful and abit weak, no SOB, minimal HR excursion to 48 She was getting tired of all the doctor visits, medicines, and tired of being tired. Amiodarone reduced She could tell she was not having PVCs, feels them when she does, planned to  have a virtual visit to try and educe her OV burden given I felt her symptoms were reliable to gude management.  I had opportunity to review with Dr. Curt Bears, agreed to reduce amio and monitor rate/symptoms, not yet clear need for pacing  TODAY She is concerned about her BP, states when she first gets up, does a few usual morning things, takes her BP and can be as high as the 170's,  then in another 1/2 hour or so prior to taking her meds comes down to the 110s, sometimes 100's. When her BP is towards 100 she feels weak and lightheaded, once was <100 and she was very lightheaded and had to lay down. She takes the valsartan '40mg'$  BID, that is how it is written on her pill bottle She is not taking the amlodipine at all unless her BP stays high, this is rarely.  She is not having PVCs or AFib HRs always in the 40's, only 2 times did it get to 50.  Regardless of walking/activity/rest.  No CP No SOB No syncope Once when her BP got <100 she felt near faint  Remains generally very tired, no energy     AFib/AAD hx Diagnosed Feb 2021 PVI ablation 04/14/21 Mexiletine started for PVCs started Feb 2023 > stopped March 2023 with increase in her tremor Amiodarone started April 2023    Past Medical History:  Diagnosis Date   Acid reflux    Allergic rhinitis    Anxiety    Asymptomatic bilateral carotid artery stenosis    40-59% (02/2018)   Atrial fibrillation ([place]) 07/2019   Breast cancer ([place]) 2015   Right Breast Cancer   CAD (coronary artery disease)    Depression    Emphysema    no longer uses inhalers   Gallstones    nausea, pain upper right abdomen   GERD (gastroesophageal reflux disease)    H/O hiatal hernia    Hiatal hernia    History of skin cancer    Hyperlipidemia    Hypertension    Multinodular goiter (nontoxic)    Scoliosis    Skin cancer    Tobacco user    Wears dentures    top   Past Surgical History:  Procedure Laterality Date   ATRIAL FIBRILLATION ABLATION N/A 04/14/2021   Procedure: ATRIAL FIBRILLATION ABLATION;  Surgeon: Constance Haw, MD;  Location: [place] [facility];  Service: Cardiovascular;  Laterality: N/A;   BREAST LUMPECTOMY Right 2015   CHOLECYSTECTOMY  07/08/2012   Procedure: LAPAROSCOPIC CHOLECYSTECTOMY WITH INTRAOPERATIVE CHOLANGIOGRAM;  Surgeon: Gayland Curry, MD,FACS;  Location: [facility];  Service: General;  Laterality:  N/A;  Laparoscopic Cholecystectomy with Intraoperative Cholangiogram   COLONOSCOPY     ESOPHAGOSCOPY N/A 05/29/2017   Procedure: ESOPHAGOSCOPY;  Surgeon: Clarene Essex, MD;  Location: [place];  Service: Endoscopy;  Laterality: N/A;  botox injection   HAND SURGERY Left    wrist   LEFT HEART CATH AND CORONARY ANGIOGRAPHY N/A 10/11/2017   Procedure: LEFT HEART CATH AND CORONARY ANGIOGRAPHY;  Surgeon: Martinique, Peter M, MD;  Location: [place] [facility];  Service: Cardiovascular;  Laterality: N/A;   TONSILECTOMY, ADENOIDECTOMY, BILATERAL MYRINGOTOMY AND TUBES     TUBAL LIGATION       Current Meds  Medication Sig   acetaminophen (TYLENOL) 500 MG tablet Take 1,000 mg by mouth every 6 (six) hours as needed for moderate pain or headache.   amiodarone (PACERONE) 200 MG tablet Take 0.5 tablets (100 mg total) by mouth daily.  apixaban (ELIQUIS) 5 MG TABS tablet Take 1 tablet (5 mg total) by mouth 2 (two) times daily.   ascorbic acid (VITAMIN C) 500 MG tablet Take 500 mg by mouth daily.   atorvastatin (LIPITOR) 40 MG tablet TAKE 1 TABLET BY MOUTH EVERYDAY AT BEDTIME   cholecalciferol (VITAMIN D3) 25 MCG (1000 UNIT) tablet Take 1,000 Units by mouth daily.   Cyanocobalamin (B-12) 1000 MCG TBCR Take 1,000 mcg by mouth daily.   diazepam (VALIUM) 5 MG tablet Take 5 mg by mouth daily as needed for anxiety.   furosemide (LASIX) 20 MG tablet TAKE 1 TABLET BY MOUTH EVERY DAY AS NEEDED (Patient taking differently: Take 20 mg by mouth daily as needed for edema.)   Magnesium 200 MG TABS Take 200 mg by mouth daily.   omeprazole (PRILOSEC) 40 MG capsule Take 40 mg by mouth daily.   [DISCONTINUED] valsartan (DIOVAN) 40 MG tablet Take 1 tablet (40 mg total) by mouth daily.   Current Facility-Administered Medications for the 01/04/22 encounter (Office Visit) with Baldwin Jamaica, PA-C  Medication   sodium chloride flush (NS) 0.9 % injection 3 mL     Allergies:   Penicillins, Fosamax [alendronate], Levofloxacin,  Mexiletine, Cefaclor, Latex, Metronidazole, Naproxen, Nsaids, Septra [bactrim], Sulfonamide derivatives, and Tape   Social History   Tobacco Use   Smoking status: Former    Packs/day: 0.50    Years: 46.00    Total pack years: 23.00    Types: Cigarettes    Quit date: 02/13/2011    Years since quitting: 10.8   Smokeless tobacco: Never  Vaping Use   Vaping Use: Never used  Substance Use Topics   Alcohol use: No   Drug use: No     Family Hx: The patient's family history includes Aplastic anemia in an other family member; Arthritis in her mother; Diverticulitis in her sister; Heart disease in her sister and another family member; Pancreatic cancer in her mother.  ROS:   Please see the history of present illness.    All other systems reviewed and are negative.   Prior CV studies:   The following studies were reviewed today:  12/07/21: LE ABIs Summary:  Right: Resting right ankle-brachial index indicates moderate right lower  extremity arterial disease. The right toe-brachial index is abnormal.   Left: Resting left ankle-brachial index indicates moderate left lower  extremity arterial disease. The left toe-brachial index is abnormal.    05/17/2021: TTE 1. Left ventricular ejection fraction, by estimation, is 55 to 60%. The  left ventricle has normal function. The left ventricle has no regional  wall motion abnormalities. There is mild asymmetric left ventricular  hypertrophy of the basal-septal segment.  Left ventricular diastolic parameters were normal.   2. Right ventricular systolic function is normal. The right ventricular  size is normal. There is mildly elevated pulmonary artery systolic  pressure. The estimated right ventricular systolic pressure is 37.9 mmHg.   3. The mitral valve is normal in structure. Mild mitral valve  regurgitation.   4. The aortic valve is tricuspid. There is mild calcification of the  aortic valve. There is mild thickening of the aortic valve.  Aortic valve  regurgitation is trivial.   5. The inferior vena cava is normal in size with greater than 50%  respiratory variability, suggesting right atrial pressure of 3 mmHg.   Comparison(s): Compared to prior TTE in 01/2021, there is no significant  change.    04/14/21: EPS/ablation CONCLUSIONS: 1. Sinus rhythm upon presentation.  2. Successful electrical isolation and anatomical encircling of all four pulmonary veins with radiofrequency current. 3. No inducible arrhythmias following ablation both on and off of dobutamine 4. No early apparent complications.  Labs/Other Tests and Data Reviewed:     Recent Labs: 06/17/2021: B Natriuretic Peptide 372.1; Hemoglobin 12.3; Platelets 245 12/21/2021: ALT 10; BUN 34; Creatinine, Ser 1.23; Potassium 4.7; Sodium 139; TSH 0.652   Recent Lipid Panel Lab Results  Component Value Date/Time   CHOL 133 07/15/2019 09:34 AM   CHOL 124 02/27/2017 09:40 AM   TRIG 48 07/15/2019 09:34 AM   HDL 59 07/15/2019 09:34 AM   HDL 58 02/27/2017 09:40 AM   CHOLHDL 2.3 07/15/2019 09:34 AM   LDLCALC 64 07/15/2019 09:34 AM   LDLCALC 61 07/14/2018 11:49 AM    Wt Readings from Last 3 Encounters:  01/04/22 126 lb 3.2 oz (57.2 kg)  12/21/21 126 lb (57.2 kg)  12/20/21 125 lb (56.7 kg)     Risk Assessment/Calculations:         Objective:    Vital Signs:  BP (!) 177/68 Comment: @ 8:43 am/138/55 @ 9:52 am  Pulse (!) 45 Comment: @ 8:43 45 @ 9:52 am  Ht '4\' 11"'$  (1.499 m)   Wt 126 lb 3.2 oz (57.2 kg)   BMI 25.49 kg/m    She sounds well, speaks in full sentences, at a normal pace. very pleasant ASSESSMENT & PLAN:    Paroxysmal AFib CHA2DS2Vasc is 5, on Eliquis, appropriately dosed She does not think she has had any AFib   PVCs Controlled with amiodarone   3. SB Sinus rate chronically at least in the last 1.5 years have been in the 50's Perhaps we can get her HR back to the 50's, her baseline without PVCs Continue low dose amiodarone for now Would  give it another couple weeks to see if her HR improves, if not and she remains fatigued may need to pursue PPM Will have her see Der. Camnitz in a couple weeks, if we plan for pacing maybe before her PVD/intervention to avoid antiplatelets on a fresh pocket  4. HTN I have advised she not take the valsartan if her BP is <120 Stop amlodipine Continue to monitor  she sees cardiology team soon     Time:   Today, I have spent 13 minutes with the patient with telehealth technology discussing the above problems.     Medication Adjustments/Labs and Tests Ordered: Current medicines are reviewed at length with the patient today.  Concerns regarding medicines are outlined above.   Tests Ordered: No orders of the defined types were placed in this encounter.   Medication Changes: Meds ordered this encounter  Medications   valsartan (DIOVAN) 40 MG tablet    Sig: Take 1 tablet (40 mg total) by mouth 2 (two) times daily. Hold for Blood Pressure <120    Dispense:  180 tablet    Refill:  3    Follow Up:  In Person in 2 week(s)  Signed, Baldwin Jamaica, PA-C  01/04/2022 1:03 PM    [place]

## 2022-01-04 ENCOUNTER — Ambulatory Visit (INDEPENDENT_AMBULATORY_CARE_PROVIDER_SITE_OTHER): Payer: Medicare Other | Admitting: Physician Assistant

## 2022-01-04 ENCOUNTER — Telehealth: Payer: Self-pay | Admitting: *Deleted

## 2022-01-04 ENCOUNTER — Other Ambulatory Visit: Payer: Medicare Other

## 2022-01-04 ENCOUNTER — Encounter: Payer: Self-pay | Admitting: Physician Assistant

## 2022-01-04 VITALS — BP 177/68 | HR 45 | Ht 59.0 in | Wt 126.2 lb

## 2022-01-04 DIAGNOSIS — I10 Essential (primary) hypertension: Secondary | ICD-10-CM

## 2022-01-04 DIAGNOSIS — I480 Paroxysmal atrial fibrillation: Secondary | ICD-10-CM | POA: Diagnosis not present

## 2022-01-04 DIAGNOSIS — I493 Ventricular premature depolarization: Secondary | ICD-10-CM

## 2022-01-04 DIAGNOSIS — I6523 Occlusion and stenosis of bilateral carotid arteries: Secondary | ICD-10-CM

## 2022-01-04 MED ORDER — VALSARTAN 40 MG PO TABS: 40.0000 mg | ORAL_TABLET | Freq: Two times a day (BID) | ORAL | 3 refills | Status: DC

## 2022-01-04 NOTE — Patient Instructions (Signed)
Medication Instructions:  Your physician has recommended you make the following change in your medication:   DISCONTINUE: Amlodipine INCREASE: Valsartan to '40mg'$  twice daily (hold if BP <120)  *If you need a refill on your cardiac medications before your next appointment, please call your pharmacy*   Lab Work: None If you have labs (blood work) drawn today and your tests are completely normal, you will receive your results only by: [place] (if you have MyChart) OR A paper copy in the mail If you have any lab test that is abnormal or we need to change your treatment, we will call you to review the results.   Follow-Up: At [facility], you and your health needs are our priority.  As part of our continuing mission to provide you with exceptional heart care, we have created designated Provider Care Teams.  These Care Teams include your primary Cardiologist (physician) and Advanced Practice Providers (APPs -  Physician Assistants and Nurse Practitioners) who all work together to provide you with the care you need, when you need it.   Your next appointment:   2-3 week(s)  The format for your next appointment:   In Person  Provider:   Allegra Lai, MD

## 2022-01-04 NOTE — Telephone Encounter (Signed)
  Patient Consent for Virtual Visit         Brittany Kirk has provided verbal consent on 01/04/2022 for a virtual visit (video or telephone).   CONSENT FOR VIRTUAL VISIT FOR:  Brittany Kirk  By participating in this virtual visit I agree to the following:  I hereby voluntarily request, consent and authorize [facility] and its employed or contracted physicians, Engineer, materials, nurse practitioners or other licensed health care professionals (the Practitioner), to provide me with telemedicine health care services (the "Services") as deemed necessary by the treating Practitioner. I acknowledge and consent to receive the Services by the Practitioner via telemedicine. I understand that the telemedicine visit will involve communicating with the Practitioner through live audiovisual communication technology and the disclosure of certain medical information by electronic transmission. I acknowledge that I have been given the opportunity to request an in-person assessment or other available alternative prior to the telemedicine visit and am voluntarily participating in the telemedicine visit.  I understand that I have the right to withhold or withdraw my consent to the use of telemedicine in the course of my care at any time, without affecting my right to future care or treatment, and that the Practitioner or I may terminate the telemedicine visit at any time. I understand that I have the right to inspect all information obtained and/or recorded in the course of the telemedicine visit and may receive copies of available information for a reasonable fee.  I understand that some of the potential risks of receiving the Services via telemedicine include:  Delay or interruption in medical evaluation due to technological equipment failure or disruption; Information transmitted may not be sufficient (e.g. poor resolution of images) to allow for appropriate medical decision making by the Practitioner;  and/or  In rare instances, security protocols could fail, causing a breach of personal health information.  Furthermore, I acknowledge that it is my responsibility to provide information about my medical history, conditions and care that is complete and accurate to the best of my ability. I acknowledge that Practitioner's advice, recommendations, and/or decision may be based on factors not within their control, such as incomplete or inaccurate data provided by me or distortions of diagnostic images or specimens that may result from electronic transmissions. I understand that the practice of medicine is not an exact science and that Practitioner makes no warranties or guarantees regarding treatment outcomes. I acknowledge that a copy of this consent can be made available to me via my patient portal ([facility] [place]), or I can request a printed copy by calling the office of [facility].    I understand that my insurance will be billed for this visit.   I have read or had this consent read to me. I understand the contents of this consent, which adequately explains the benefits and risks of the Services being provided via telemedicine.  I have been provided ample opportunity to ask questions regarding this consent and the Services and have had my questions answered to my satisfaction. I give my informed consent for the services to be provided through the use of telemedicine in my medical care

## 2022-01-05 ENCOUNTER — Encounter (HOSPITAL_COMMUNITY): Payer: Medicare Other

## 2022-01-05 ENCOUNTER — Other Ambulatory Visit: Payer: Self-pay

## 2022-01-05 DIAGNOSIS — I10 Essential (primary) hypertension: Secondary | ICD-10-CM

## 2022-01-05 LAB — BASIC METABOLIC PANEL
BUN/Creatinine Ratio: 21 (ref 12–28)
BUN: 22 mg/dL (ref 8–27)
CO2: 22 mmol/L (ref 20–29)
Calcium: 8.3 mg/dL — ABNORMAL LOW (ref 8.7–10.3)
Chloride: 98 mmol/L (ref 96–106)
Creatinine, Ser: 1.03 mg/dL — ABNORMAL HIGH (ref 0.57–1.00)
Glucose: 110 mg/dL — ABNORMAL HIGH (ref 70–99)
Potassium: 4.5 mmol/L (ref 3.5–5.2)
Sodium: 132 mmol/L — ABNORMAL LOW (ref 134–144)
eGFR: 56 mL/min/{1.73_m2} — ABNORMAL LOW (ref >59)

## 2022-01-05 MED ORDER — VALSARTAN 40 MG PO TABS: 40.0000 mg | ORAL_TABLET | Freq: Every day | ORAL | 3 refills | Status: DC

## 2022-01-09 DIAGNOSIS — C44319 Basal cell carcinoma of skin of other parts of face: Secondary | ICD-10-CM | POA: Diagnosis not present

## 2022-01-09 DIAGNOSIS — Z85828 Personal history of other malignant neoplasm of skin: Secondary | ICD-10-CM | POA: Diagnosis not present

## 2022-01-11 ENCOUNTER — Other Ambulatory Visit: Payer: Self-pay | Admitting: *Deleted

## 2022-01-11 NOTE — Patient Outreach (Signed)
  Care Coordination   01/11/2022 Name: Brittany Kirk MRN: [id number] DOB: 01-30-1946   Care Coordination Outreach Attempts:  Contact was made with the patient today to offer care coordination services as a benefit of their health plan. Patient declined services.   Follow Up Plan:   No further outreach attempts will be made at this time.   Encounter Outcome:  Pt. Refused  Care Coordination Interventions Activated:  No   Care Coordination Interventions:  No, not indicated    Emelia Loron RN, BSN [place] [phone] [email or web address]

## 2022-01-16 ENCOUNTER — Other Ambulatory Visit: Payer: Self-pay | Admitting: Family Medicine

## 2022-01-17 ENCOUNTER — Telehealth: Payer: Self-pay | Admitting: Pharmacist

## 2022-01-17 NOTE — Progress Notes (Signed)
Chronic Care Management Pharmacy Assistant   Name: Brittany Kirk  MRN: [id number] DOB: 06/20/1945   Reason for Encounter: Disease State - Hypertension Call      Recent office visits:  None noted.   Recent consult visits:  01/04/22 Tommye Standard, PA-C - Cardiology - Afib - Valsartan (DIOVAN) 40 MG tablet prescribed. I have advised she not take the valsartan if her BP is <120. Stop amlodipine. Follow up in 2 weeks.  12/21/21 Tommye Standard, PA-C - Cardiology - Afib - Labs were ordered EKG performed. Amiodarone (PACERONE) 200 MG tablet. Follow up in 2 weeks.  12/20/21 [name] MD - Cardiology - CAD - Vas US Duplex ordered. Follow up in 4 weeks.   11/21/21 Almyra Deforest, PA-C - Cardiology - Extremity Cyanosis - Vascular ABI ordered, Amlodpine 2.'5mg'$  prescribed. Follow up as scheduled.   11/14/21 Chistopher Pavero, MD - Hypertension - Cardiology - valsartan (DIOVAN) 40 MG tablet prescribed BID. Follow up as scheduled.   10/12/21 Tommy Medal, RPH-CPP - Cardiology - Labs were ordered. valsartan (DIOVAN) 80 MG tablet and omeprazole (PRILOSEC) 40 MG capsule prescribed. Labs in 2 weeks.   09/28/21 Rollen Sox - Cardiology - Hypertension - Furosemide (LASIX) 20 MG tablet Discontinue metoprolol. Follow up as scheduled.   09/19/21 Will Camnitz MD - Afib - EKF performed. amiodarone (PACERONE) 200 MG tablet and furosemide (LASIX) 20 MG tablet prescribed. Follow up 3 months.   08/31/21 [name] MD - Cardiology - CAD - EKG performed and Vascular US ordered. Stop mexiletine (mexitil).   08/08/21 Will Camnitz MD - Cardiology - Afib - Ekg performed. furosemide (LASIX) 20 MG tablet and mexiletine (MEXITIL) 250 MG capsule prescribed. Follow up in 3 months.    Hospital visits:  None in previous 6 months  Medications: Outpatient Encounter Medications as of 01/17/2022  Medication Sig   acetaminophen (TYLENOL) 500 MG tablet Take 1,000 mg by mouth every 6 (six) hours as needed for moderate pain or  headache.   amiodarone (PACERONE) 200 MG tablet Take 0.5 tablets (100 mg total) by mouth daily.   apixaban (ELIQUIS) 5 MG TABS tablet Take 1 tablet (5 mg total) by mouth 2 (two) times daily.   ascorbic acid (VITAMIN C) 500 MG tablet Take 500 mg by mouth daily.   atorvastatin (LIPITOR) 40 MG tablet TAKE 1 TABLET BY MOUTH EVERYDAY AT BEDTIME   cholecalciferol (VITAMIN D3) 25 MCG (1000 UNIT) tablet Take 1,000 Units by mouth daily.   Cyanocobalamin (B-12) 1000 MCG TBCR Take 1,000 mcg by mouth daily.   diazepam (VALIUM) 5 MG tablet Take 5 mg by mouth daily as needed for anxiety.   furosemide (LASIX) 20 MG tablet TAKE 1 TABLET BY MOUTH EVERY DAY AS NEEDED (Patient taking differently: Take 20 mg by mouth daily as needed for edema.)   Magnesium 200 MG TABS Take 200 mg by mouth daily.   omeprazole (PRILOSEC) 40 MG capsule Take 40 mg by mouth daily.   traZODone (DESYREL) 50 MG tablet TAKE 1/2 TO 1 TABLETS (25-50 MG TOTAL) BY MOUTH AT BEDTIME AS NEEDED. FOR SLEEP   valsartan (DIOVAN) 40 MG tablet Take 1 tablet (40 mg total) by mouth daily. Hold for Blood Pressure <120   Facility-Administered Encounter Medications as of 01/17/2022  Medication   sodium chloride flush (NS) 0.9 % injection 3 mL    Current antihypertensive regimen:  Furosemide (LASIX) 20 MG tablet Amiodarone (PACERONE) 200 MG tablet Valsartan 40 mg 1 tablet daily if BP >120  How often  are you checking your Blood Pressure?  Patient reported checking blood pressures weekly. She reports they have been lower in the mornings.She was instructed to only take Valsartan if her blood pressure was above 309 systolic.  Current home BP readings: 160/70 then later 102/60    What recent interventions/DTPs have been made by any provider to improve Blood Pressure control since last CPP Visit:  Patient amlodipine was D/C and Valsartan 40 mg was added on 01/04/22.   Any recent hospitalizations or ED visits since last visit with CPP? Patient has not had  any hospitalizations or ED visits since last visit with CPP   What diet changes have been made to improve Blood Pressure Control?  Patient reported limiting her salt intake.    What exercise is being done to improve your Blood Pressure Control?   Patient reported she is not getting much exercise due to a concern with her blood flow in her legs and is seeing Dr Gwenlyn Found about this.    Adherence Review: Is the patient currently on ACE/ARB medication? yes Does the patient have >5 day gap between last estimated fill dates? No     Care Gaps   AWV: 05/11/21 Colonoscopy: done 10/22/12 DM Eye Exam: N/A DM Foot Exam: N/A Microalbumin: N/A HbgAIC: done 07/15/19 (5.3) DEXA: ordered Mammogram: done 12/23/20     Star Rating Drugs: Atorvastatin (LIPITOR) 40 MG tablet - last filled 12/11/21  90 days  Valsartan (DIOVAN) 40 MG tablet - last filled 01/11/22 90 days    Future Appointments  Date Time Provider [place]  01/23/2022  3:35 PM Almyra Deforest, [place] [facility]  02/19/2022  9:30 AM [facility] VASC 1 [facility]  02/28/2022 10:30 AM Lorretta Harp, MD [facility]  03/01/2022  2:00 PM [facility] NL VASC [place], [place] Clinical Pharmacist Assistant  [phone]

## 2022-01-19 ENCOUNTER — Telehealth: Payer: Self-pay

## 2022-01-19 ENCOUNTER — Telehealth: Payer: Self-pay | Admitting: Cardiology

## 2022-01-19 NOTE — Telephone Encounter (Signed)
Pt was instructed to contact Tommye Standard, PA-C on 01/19/2022 with update on how she was doing on Valsartan.  Pt stated only takes if BP 664 systolic or greater.  If BP less than 403 systolic she holds her medication.   Pt stated her BP in the mornings are up and down.  Sometimes 168 / 61 HR 49 on 7/26 at 848 am, and 98/37 with HR of 46 and hour later.   Pt states she sometimes takes the Valsartan, and other times she does not, as she if following the order parameters.    Pt also states only out of the 40 HR range, 4-5 times in the last few days.  53 HR has been her highest reading.    Pt is taking lasix every morning as well.  Pt was advised to continue managing her BP, follow Valsartan order instructions, and take her BP record with her to her upcoming PCP appointment next week.  I will share this information / Note with Tommye Standard, PA-C.

## 2022-01-19 NOTE — Telephone Encounter (Signed)
Patient states she was advised to give Renee a call today.

## 2022-01-22 ENCOUNTER — Ambulatory Visit: Payer: Medicare Other | Admitting: Physician Assistant

## 2022-01-23 ENCOUNTER — Encounter: Payer: Self-pay | Admitting: Physician Assistant

## 2022-01-23 ENCOUNTER — Ambulatory Visit (INDEPENDENT_AMBULATORY_CARE_PROVIDER_SITE_OTHER): Payer: Medicare Other | Admitting: Physician Assistant

## 2022-01-23 VITALS — BP 120/48 | HR 45 | Ht 59.0 in | Wt 129.2 lb

## 2022-01-23 DIAGNOSIS — R001 Bradycardia, unspecified: Secondary | ICD-10-CM

## 2022-01-23 DIAGNOSIS — E785 Hyperlipidemia, unspecified: Secondary | ICD-10-CM | POA: Diagnosis not present

## 2022-01-23 DIAGNOSIS — I10 Essential (primary) hypertension: Secondary | ICD-10-CM

## 2022-01-23 DIAGNOSIS — Z01818 Encounter for other preprocedural examination: Secondary | ICD-10-CM

## 2022-01-23 DIAGNOSIS — I6523 Occlusion and stenosis of bilateral carotid arteries: Secondary | ICD-10-CM | POA: Diagnosis not present

## 2022-01-23 DIAGNOSIS — I2510 Atherosclerotic heart disease of native coronary artery without angina pectoris: Secondary | ICD-10-CM | POA: Diagnosis not present

## 2022-01-23 DIAGNOSIS — I739 Peripheral vascular disease, unspecified: Secondary | ICD-10-CM

## 2022-01-23 DIAGNOSIS — I480 Paroxysmal atrial fibrillation: Secondary | ICD-10-CM

## 2022-01-23 NOTE — Progress Notes (Signed)
Cardiology Office Note:    Date:  01/26/2022   ID:  Brittany Kirk, DOB 28-Jan-1946, MRN [id number]  PCP:  Susy Frizzle, MD   [place] Cardiologist:  [name], MD Electrophysiologist:  Will Meredith Leeds, MD     Referring MD: Susy Frizzle, MD   Chief Complaint  Patient presents with   Follow-up    Seen for Dr. Gwenlyn Found    History of Present Illness:    Brittany Kirk is a 76 y.o. female with a hx of carotid artery disease, atrial fibrillation, CAD, depression, hypertension, and hyperlipidemia.  She has a strong family of early CAD.  However she herself has never been diagnosed with heart attack or stroke.  She had a remote cardiac catheterization by Dr. Lia Foyer, no intervention was performed.  Myoview in August 2017 was negative.  Diagnostic cardiac catheterization in May 2019 showed moderate severe ramus branch ostial disease, normal EF, unchanged when compared to the previous cath in 2010.  She was seen in the ED in February 2021 due to atrial fibrillation with RVR.  She converted spontaneously to sinus rhythm and was started on Eliquis.  She was readmitted in August 2022 with A-fib with RVR and again self converted.  Troponin was mildly elevated.  2D echo was normal.  She underwent atrial fibrillation ablation on 04/14/2021.  She does have high PVC burden of 17%, she was started on mexiletine in February however this exacerbated her baseline tremor.  Mexiletine was later stopped by Dr. Gwenlyn Found in March.  She was started on amiodarone during last EP visit in April 2023.  Since then, she has been seen multiple times in the hypertension clinic.  She complains of swollen and painful feet and was given few days of diuretic.  Due to bradycardia, metoprolol was discontinued.  During the last visit, it was mentioned that her feet would become dark blue versus purple when she sat down and feels cold.  When she stood up and moving around, her feet were to return to  normal color.  Her blood pressure remained elevated, losartan was divided into 40 mg twice a day since her elevation of the blood pressure is worse in the morning.  I last saw the patient on 11/21/2021 for examination of cyanosis of lower extremity.  She states that this has been going on for the past several years and her ankle and feet would turn purple and dark blue when she is a day and symptom resolved when she walk around.  I recommended addition of low-dose amlodipine for vasodilatory purposes and ordered ABI.  ABI obtained on 12/07/2021 showed a moderate bilateral lower extremity disease.  Lower extremity arterial Doppler revealed greater than 50% stenosis in the right common and external iliac artery, 50 to 75% stenosis in the ostial SFA, greater than 50% stenosis in left common and external iliac artery, 50 to 74% stenosis in the left proximal CFA.  She was seen by Dr. Gwenlyn Found on 12/20/2021 who was concerned about her slow heart rate in the 40s.  She was seen by EP service will Her amiodarone down to 100 mg daily.  She was last seen by EP service on 01/04/2022, recommended to continue on low-dose amiodarone, and if her symptom persist especially with the fatigue, may pursue pacemaker in the future.  Past Medical History:  Diagnosis Date   Acid reflux    Allergic rhinitis    Anxiety    Asymptomatic bilateral carotid artery stenosis  40-59% (02/2018)   Atrial fibrillation ([place]) 07/2019   Breast cancer ([place]) 2015   Right Breast Cancer   CAD (coronary artery disease)    Depression    Emphysema    no longer uses inhalers   Gallstones    nausea, pain upper right abdomen   GERD (gastroesophageal reflux disease)    H/O hiatal hernia    Hiatal hernia    History of skin cancer    Hyperlipidemia    Hypertension    Multinodular goiter (nontoxic)    Scoliosis    Skin cancer    Tobacco user    Wears dentures    top    Past Surgical History:  Procedure Laterality Date   ATRIAL FIBRILLATION  ABLATION N/A 04/14/2021   Procedure: ATRIAL FIBRILLATION ABLATION;  Surgeon: Constance Haw, MD;  Location: [facility];  Service: Cardiovascular;  Laterality: N/A;   BREAST LUMPECTOMY Right 2015   CHOLECYSTECTOMY  07/08/2012   Procedure: LAPAROSCOPIC CHOLECYSTECTOMY WITH INTRAOPERATIVE CHOLANGIOGRAM;  Surgeon: Gayland Curry, MD,FACS;  Location: [facility];  Service: General;  Laterality: N/A;  Laparoscopic Cholecystectomy with Intraoperative Cholangiogram   COLONOSCOPY     ESOPHAGOSCOPY N/A 05/29/2017   Procedure: ESOPHAGOSCOPY;  Surgeon: Clarene Essex, MD;  Location: [facility];  Service: Endoscopy;  Laterality: N/A;  botox injection   HAND SURGERY Left    wrist   LEFT HEART CATH AND CORONARY ANGIOGRAPHY N/A 10/11/2017   Procedure: LEFT HEART CATH AND CORONARY ANGIOGRAPHY;  Surgeon: Martinique, Peter M, MD;  Location: [facility];  Service: Cardiovascular;  Laterality: N/A;   TONSILECTOMY, ADENOIDECTOMY, BILATERAL MYRINGOTOMY AND TUBES     TUBAL LIGATION      Current Medications: Current Meds  Medication Sig   amiodarone (PACERONE) 200 MG tablet Take 0.5 tablets (100 mg total) by mouth daily.   apixaban (ELIQUIS) 5 MG TABS tablet Take 1 tablet (5 mg total) by mouth 2 (two) times daily.   ascorbic acid (VITAMIN C) 500 MG tablet Take 500 mg by mouth daily.   atorvastatin (LIPITOR) 40 MG tablet TAKE 1 TABLET BY MOUTH EVERYDAY AT BEDTIME   cholecalciferol (VITAMIN D3) 25 MCG (1000 UNIT) tablet Take 1,000 Units by mouth daily.   Cyanocobalamin (B-12) 1000 MCG TBCR Take 1,000 mcg by mouth daily.   furosemide (LASIX) 20 MG tablet TAKE 1 TABLET BY MOUTH EVERY DAY AS NEEDED (Patient taking differently: Take 20 mg by mouth daily as needed for edema.)   Magnesium 200 MG TABS Take 200 mg by mouth daily.   omeprazole (PRILOSEC) 40 MG capsule Take 40 mg by mouth daily.   traZODone (DESYREL) 50 MG tablet TAKE 1/2 TO 1 TABLETS (25-50 MG TOTAL) BY MOUTH AT BEDTIME AS NEEDED. FOR SLEEP    valsartan (DIOVAN) 40 MG tablet Take 1 tablet (40 mg total) by mouth daily. Hold for Blood Pressure <120   Current Facility-Administered Medications for the 01/23/22 encounter (Office Visit) with Almyra Deforest, PA  Medication   sodium chloride flush (NS) 0.9 % injection 3 mL     Allergies:   Penicillins, Fosamax [alendronate], Levofloxacin, Mexiletine, Cefaclor, Latex, Metronidazole, Naproxen, Nsaids, Septra [bactrim], Sulfonamide derivatives, and Tape   Social History   Socioeconomic History   Marital status: Married    Spouse name: Harrie Jeans   Number of children: 1   Years of education: Not on file   Highest education level: Not on file  Occupational History   Occupation: retired truck Orthoptist: RETIRED  Tobacco Use   Smoking status: Former    Packs/day: 0.50    Years: 46.00    Total pack years: 23.00    Types: Cigarettes    Quit date: 02/13/2011    Years since quitting: 10.9   Smokeless tobacco: Never  Vaping Use   Vaping Use: Never used  Substance and Sexual Activity   Alcohol use: No   Drug use: No   Sexual activity: Not on file  Other Topics Concern   Not on file  Social History Narrative   Married since 1991, second marriage.    1 son   1 grandson   1 granddaughter   2 step children   3 step grandchildren   Social Determinants of Health   Financial Resource Strain: Low Risk  (05/11/2021)   Overall Financial Resource Strain (CARDIA)    Difficulty of Paying Living Expenses: Not hard at all  Food Insecurity: No Food Insecurity (05/11/2021)   Hunger Vital Sign    Worried About Running Out of Food in the Last Year: Never true    Ran Out of Food in the Last Year: Never true  Transportation Needs: No Transportation Needs (05/11/2021)   PRAPARE - Hydrologist (Medical): No    Lack of Transportation (Non-Medical): No  Physical Activity: Insufficiently Active (05/11/2021)   Exercise Vital Sign    Days of Exercise per Week: 3 days     Minutes of Exercise per Session: 20 min  Stress: Stress Concern Present (05/11/2021)   [place]    Feeling of Stress : To some extent  Social Connections: Socially Integrated (05/11/2021)   Social Connection and Isolation Panel [NHANES]    Frequency of Communication with Friends and Family: More than three times a week    Frequency of Social Gatherings with Friends and Family: More than three times a week    Attends Religious Services: More than 4 times per year    Active Member of [employer] or Organizations: Yes    Attends Music therapist: More than 4 times per year    Marital Status: Married     Family History: The patient's family history includes Aplastic anemia in an other family member; Arthritis in her mother; Diverticulitis in her sister; Heart disease in her sister and another family member; Pancreatic cancer in her mother.  ROS:   Please see the history of present illness.     All other systems reviewed and are negative.  EKGs/Labs/Other Studies Reviewed:    The following studies were reviewed today:  Echo 05/17/2021 1. Left ventricular ejection fraction, by estimation, is 55 to 60%. The  left ventricle has normal function. The left ventricle has no regional  wall motion abnormalities. There is mild asymmetric left ventricular  hypertrophy of the basal-septal segment.  Left ventricular diastolic parameters were normal.   2. Right ventricular systolic function is normal. The right ventricular  size is normal. There is mildly elevated pulmonary artery systolic  pressure. The estimated right ventricular systolic pressure is 55.7 mmHg.   3. The mitral valve is normal in structure. Mild mitral valve  regurgitation.   4. The aortic valve is tricuspid. There is mild calcification of the  aortic valve. There is mild thickening of the aortic valve. Aortic valve  regurgitation is trivial.   5. The  inferior vena cava is normal in size with greater than 50%  respiratory variability, suggesting right atrial pressure of  3 mmHg.   Comparison(s): Compared to prior TTE in 01/2021, there is no significant  change.   EKG:  EKG is not ordered today.    Recent Labs: 06/17/2021: B Natriuretic Peptide 372.1; Hemoglobin 12.3; Platelets 245 12/21/2021: ALT 10; TSH 0.652 01/04/2022: BUN 22; Creatinine, Ser 1.03; Potassium 4.5; Sodium 132  Recent Lipid Panel    Component Value Date/Time   CHOL 133 07/15/2019 0934   CHOL 124 02/27/2017 0940   TRIG 48 07/15/2019 0934   HDL 59 07/15/2019 0934   HDL 58 02/27/2017 0940   CHOLHDL 2.3 07/15/2019 0934   VLDL 10 07/15/2019 0934   LDLCALC 64 07/15/2019 0934   LDLCALC 61 07/14/2018 1149     Risk Assessment/Calculations:    CHA2DS2-VASc Score = 5   This indicates a 7.2% annual risk of stroke. The patient's score is based upon: CHF History: 0 HTN History: 1 Diabetes History: 0 Stroke History: 0 Vascular Disease History: 1 Age Score: 2 Gender Score: 1          Physical Exam:    VS:  BP (!) 120/48 (BP Location: Left Arm, Patient Position: Sitting, Cuff Size: Normal)   Pulse (!) 45   Ht '4\' 11"'$  (1.499 m)   Wt 129 lb 3.2 oz (58.6 kg)   SpO2 98%   BMI 26.10 kg/m        Wt Readings from Last 3 Encounters:  01/23/22 129 lb 3.2 oz (58.6 kg)  01/04/22 126 lb 3.2 oz (57.2 kg)  12/21/21 126 lb (57.2 kg)     GEN:  Well nourished, well developed in no acute distress HEENT: Normal NECK: No JVD; No carotid bruits LYMPHATICS: No lymphadenopathy CARDIAC: RRR, no murmurs, rubs, gallops RESPIRATORY:  Clear to auscultation without rales, wheezing or rhonchi  ABDOMEN: Soft, non-tender, non-distended MUSCULOSKELETAL:  No edema; No deformity  SKIN: Warm and dry NEUROLOGIC:  Alert and oriented x 3 PSYCHIATRIC:  Normal affect   ASSESSMENT:    1. Claudication in peripheral vascular disease ([place])   2. Pre-procedural examination   3. Coronary  artery disease involving native coronary artery of native heart without angina pectoris   4. Essential hypertension   5. Hyperlipidemia LDL goal <70   6. PAF (paroxysmal atrial fibrillation) ([place])   7. Bradycardia    PLAN:    In order of problems listed above:  Lower extremity claudication: I recently evaluated the patient for lower extremity cyanosis, her feet will turn purple and blue about setting and return to normal color with standing.  I suspected arterial disease.  This was confirmed on ABI.  She has weakness in bilateral lower extremity.  We discussed possibility of proceeding with lower extremity angiography.  Risk and benefit of the upcoming procedure including major bleeding, vascular injury, kidney injury, heart attack, stroke loss of life or limb has been discussed with the patient and her husband.  She is willing to proceed.  We will obtain CBC and basic metabolic panel  CAD: Denies any chest pain  Hypertension: Blood pressure stable  Hyperlipidemia: On Lipitor  PAF: Currently on lower dose of amiodarone and Eliquis.  Patient is aware to hold Eliquis for 2 days prior to the procedure  Asymptomatic bradycardia: Previously amiodarone has been cut back by EP service, however patient remained bradycardic.  I discussed the case with Dr. Gwenlyn Found who is willing to proceed given the asymptomatic nature of bradycardia.  Patient will continue to be followed up by EP service.      Shared  Decision Making/Informed Consent The risks [stroke (1 in 1000), death (1 in 1000), kidney failure [usually temporary] (1 in 500), bleeding (1 in 200), allergic reaction [possibly serious] (1 in 200)], benefits (diagnostic support and management of coronary artery disease) and alternatives of a lower extremity angiography were discussed in detail with Ms. Macfarlane and she is willing to proceed.    Medication Adjustments/Labs and Tests Ordered: Current medicines are reviewed at length with the patient  today.  Concerns regarding medicines are outlined above.  Orders Placed This Encounter  Procedures   CBC   Basic Metabolic Panel (BMET)   No orders of the defined types were placed in this encounter.   Patient Instructions  Medication Instructions:  Your physician recommends that you continue on your current medications as directed. Please refer to the Current Medication list given to you today.  HOLD Lasix and Eliquis 2 days prior to your procedure.  *If you need a refill on your cardiac medications before your next appointment, please call your pharmacy*   Lab Work: Your physician recommends that you return for lab work in: Next Monday or Tuesday  CBC and BMET  If you have labs (blood work) drawn today and your tests are completely normal, you will receive your results only by: [place] (if you have MyChart) OR A paper copy in the mail If you have any lab test that is abnormal or we need to change your treatment, we will call you to review the results.   Testing/Procedures: NONE   Follow-Up: At [facility], you and your health needs are our priority.  As part of our continuing mission to provide you with exceptional heart care, we have created designated Provider Care Teams.  These Care Teams include your primary Cardiologist (physician) and Advanced Practice Providers (APPs -  Physician Assistants and Nurse Practitioners) who all work together to provide you with the care you need, when you need it.  We recommend signing up for the patient portal called "MyChart".  Sign up information is provided on this After Visit Summary.  MyChart is used to connect with patients for Virtual Visits (Telemedicine).  Patients are able to view lab/test results, encounter notes, upcoming appointments, etc.  Non-urgent messages can be sent to your provider as well.   To learn more about what you can do with MyChart, go to [email or web address].    Your next appointment:   Please keep  your upcoming appointment with Dr Gwenlyn Found 02/28/2022.     Hilbert Corrigan, [place]  01/26/2022 12:25 AM    [place]

## 2022-01-23 NOTE — Patient Instructions (Addendum)
Medication Instructions:  Your physician recommends that you continue on your current medications as directed. Please refer to the Current Medication list given to you today.  HOLD Lasix and Eliquis 2 days prior to your procedure.  *If you need a refill on your cardiac medications before your next appointment, please call your pharmacy*   Lab Work: Your physician recommends that you return for lab work in: Next Monday or Tuesday  CBC and BMET  If you have labs (blood work) drawn today and your tests are completely normal, you will receive your results only by: [place] (if you have MyChart) OR A paper copy in the mail If you have any lab test that is abnormal or we need to change your treatment, we will call you to review the results.   Testing/Procedures: NONE   Follow-Up: At [facility], you and your health needs are our priority.  As part of our continuing mission to provide you with exceptional heart care, we have created designated Provider Care Teams.  These Care Teams include your primary Cardiologist (physician) and Advanced Practice Providers (APPs -  Physician Assistants and Nurse Practitioners) who all work together to provide you with the care you need, when you need it.  We recommend signing up for the patient portal called "MyChart".  Sign up information is provided on this After Visit Summary.  MyChart is used to connect with patients for Virtual Visits (Telemedicine).  Patients are able to view lab/test results, encounter notes, upcoming appointments, etc.  Non-urgent messages can be sent to your provider as well.   To learn more about what you can do with MyChart, go to [email or web address].    Your next appointment:   Please keep your upcoming appointment with Dr Gwenlyn Found 02/28/2022.

## 2022-01-23 NOTE — H&P (View-Only) (Signed)
Cardiology Office Note:    Date:  01/26/2022   ID:  Brittany Kirk, DOB 1945-12-04, MRN [id number]  PCP:  Susy Frizzle, MD   [facility] Cardiologist:  [name], MD Electrophysiologist:  Will Meredith Leeds, MD     Referring MD: Susy Frizzle, MD   Chief Complaint  Patient presents with   Follow-up    Seen for Dr. Gwenlyn Found    History of Present Illness:    Brittany Kirk is a 76 y.o. female with a hx of carotid artery disease, atrial fibrillation, CAD, depression, hypertension, and hyperlipidemia.  She has a strong family of early CAD.  However she herself has never been diagnosed with heart attack or stroke.  She had a remote cardiac catheterization by Dr. Lia Foyer, no intervention was performed.  Myoview in August 2017 was negative.  Diagnostic cardiac catheterization in May 2019 showed moderate severe ramus branch ostial disease, normal EF, unchanged when compared to the previous cath in 2010.  She was seen in the ED in February 2021 due to atrial fibrillation with RVR.  She converted spontaneously to sinus rhythm and was started on Eliquis.  She was readmitted in August 2022 with A-fib with RVR and again self converted.  Troponin was mildly elevated.  2D echo was normal.  She underwent atrial fibrillation ablation on 04/14/2021.  She does have high PVC burden of 17%, she was started on mexiletine in February however this exacerbated her baseline tremor.  Mexiletine was later stopped by Dr. Gwenlyn Found in March.  She was started on amiodarone during last EP visit in April 2023.  Since then, she has been seen multiple times in the hypertension clinic.  She complains of swollen and painful feet and was given few days of diuretic.  Due to bradycardia, metoprolol was discontinued.  During the last visit, it was mentioned that her feet would become dark blue versus purple when she sat down and feels cold.  When she stood up and moving around, her feet were to return to  normal color.  Her blood pressure remained elevated, losartan was divided into 40 mg twice a day since her elevation of the blood pressure is worse in the morning.  I last saw the patient on 11/21/2021 for examination of cyanosis of lower extremity.  She states that this has been going on for the past several years and her ankle and feet would turn purple and dark blue when she is a day and symptom resolved when she walk around.  I recommended addition of low-dose amlodipine for vasodilatory purposes and ordered ABI.  ABI obtained on 12/07/2021 showed a moderate bilateral lower extremity disease.  Lower extremity arterial Doppler revealed greater than 50% stenosis in the right common and external iliac artery, 50 to 75% stenosis in the ostial SFA, greater than 50% stenosis in left common and external iliac artery, 50 to 74% stenosis in the left proximal CFA.  She was seen by Dr. Gwenlyn Found on 12/20/2021 who was concerned about her slow heart rate in the 40s.  She was seen by EP service will Her amiodarone down to 100 mg daily.  She was last seen by EP service on 01/04/2022, recommended to continue on low-dose amiodarone, and if her symptom persist especially with the fatigue, may pursue pacemaker in the future.  Past Medical History:  Diagnosis Date   Acid reflux    Allergic rhinitis    Anxiety    Asymptomatic bilateral carotid artery stenosis  40-59% (02/2018)   Atrial fibrillation ([place]) 07/2019   Breast cancer ([place]) 2015   Right Breast Cancer   CAD (coronary artery disease)    Depression    Emphysema    no longer uses inhalers   Gallstones    nausea, pain upper right abdomen   GERD (gastroesophageal reflux disease)    H/O hiatal hernia    Hiatal hernia    History of skin cancer    Hyperlipidemia    Hypertension    Multinodular goiter (nontoxic)    Scoliosis    Skin cancer    Tobacco user    Wears dentures    top    Past Surgical History:  Procedure Laterality Date   ATRIAL FIBRILLATION  ABLATION N/A 04/14/2021   Procedure: ATRIAL FIBRILLATION ABLATION;  Surgeon: Constance Haw, MD;  Location: [facility];  Service: Cardiovascular;  Laterality: N/A;   BREAST LUMPECTOMY Right 2015   CHOLECYSTECTOMY  07/08/2012   Procedure: LAPAROSCOPIC CHOLECYSTECTOMY WITH INTRAOPERATIVE CHOLANGIOGRAM;  Surgeon: Gayland Curry, MD,FACS;  Location: [facility];  Service: General;  Laterality: N/A;  Laparoscopic Cholecystectomy with Intraoperative Cholangiogram   COLONOSCOPY     ESOPHAGOSCOPY N/A 05/29/2017   Procedure: ESOPHAGOSCOPY;  Surgeon: Clarene Essex, MD;  Location: [facility];  Service: Endoscopy;  Laterality: N/A;  botox injection   HAND SURGERY Left    wrist   LEFT HEART CATH AND CORONARY ANGIOGRAPHY N/A 10/11/2017   Procedure: LEFT HEART CATH AND CORONARY ANGIOGRAPHY;  Surgeon: Martinique, Peter M, MD;  Location: [facility];  Service: Cardiovascular;  Laterality: N/A;   TONSILECTOMY, ADENOIDECTOMY, BILATERAL MYRINGOTOMY AND TUBES     TUBAL LIGATION      Current Medications: Current Meds  Medication Sig   amiodarone (PACERONE) 200 MG tablet Take 0.5 tablets (100 mg total) by mouth daily.   apixaban (ELIQUIS) 5 MG TABS tablet Take 1 tablet (5 mg total) by mouth 2 (two) times daily.   ascorbic acid (VITAMIN C) 500 MG tablet Take 500 mg by mouth daily.   atorvastatin (LIPITOR) 40 MG tablet TAKE 1 TABLET BY MOUTH EVERYDAY AT BEDTIME   cholecalciferol (VITAMIN D3) 25 MCG (1000 UNIT) tablet Take 1,000 Units by mouth daily.   Cyanocobalamin (B-12) 1000 MCG TBCR Take 1,000 mcg by mouth daily.   furosemide (LASIX) 20 MG tablet TAKE 1 TABLET BY MOUTH EVERY DAY AS NEEDED (Patient taking differently: Take 20 mg by mouth daily as needed for edema.)   Magnesium 200 MG TABS Take 200 mg by mouth daily.   omeprazole (PRILOSEC) 40 MG capsule Take 40 mg by mouth daily.   traZODone (DESYREL) 50 MG tablet TAKE 1/2 TO 1 TABLETS (25-50 MG TOTAL) BY MOUTH AT BEDTIME AS NEEDED. FOR SLEEP    valsartan (DIOVAN) 40 MG tablet Take 1 tablet (40 mg total) by mouth daily. Hold for Blood Pressure <120   Current Facility-Administered Medications for the 01/23/22 encounter (Office Visit) with Almyra Deforest, PA  Medication   sodium chloride flush (NS) 0.9 % injection 3 mL     Allergies:   Penicillins, Fosamax [alendronate], Levofloxacin, Mexiletine, Cefaclor, Latex, Metronidazole, Naproxen, Nsaids, Septra [bactrim], Sulfonamide derivatives, and Tape   Social History   Socioeconomic History   Marital status: Married    Spouse name: Harrie Jeans   Number of children: 1   Years of education: Not on file   Highest education level: Not on file  Occupational History   Occupation: retired truck Orthoptist: RETIRED  Tobacco Use   Smoking status: Former    Packs/day: 0.50    Years: 46.00    Total pack years: 23.00    Types: Cigarettes    Quit date: 02/13/2011    Years since quitting: 10.9   Smokeless tobacco: Never  Vaping Use   Vaping Use: Never used  Substance and Sexual Activity   Alcohol use: No   Drug use: No   Sexual activity: Not on file  Other Topics Concern   Not on file  Social History Narrative   Married since 1991, second marriage.    1 son   1 grandson   1 granddaughter   2 step children   3 step grandchildren   Social Determinants of Health   Financial Resource Strain: Low Risk  (05/11/2021)   Overall Financial Resource Strain (CARDIA)    Difficulty of Paying Living Expenses: Not hard at all  Food Insecurity: No Food Insecurity (05/11/2021)   Hunger Vital Sign    Worried About Running Out of Food in the Last Year: Never true    Ran Out of Food in the Last Year: Never true  Transportation Needs: No Transportation Needs (05/11/2021)   PRAPARE - Hydrologist (Medical): No    Lack of Transportation (Non-Medical): No  Physical Activity: Insufficiently Active (05/11/2021)   Exercise Vital Sign    Days of Exercise per Week: 3 days     Minutes of Exercise per Session: 20 min  Stress: Stress Concern Present (05/11/2021)   [place]    Feeling of Stress : To some extent  Social Connections: Socially Integrated (05/11/2021)   Social Connection and Isolation Panel [NHANES]    Frequency of Communication with Friends and Family: More than three times a week    Frequency of Social Gatherings with Friends and Family: More than three times a week    Attends Religious Services: More than 4 times per year    Active Member of [employer] or Organizations: Yes    Attends Music therapist: More than 4 times per year    Marital Status: Married     Family History: The patient's family history includes Aplastic anemia in an other family member; Arthritis in her mother; Diverticulitis in her sister; Heart disease in her sister and another family member; Pancreatic cancer in her mother.  ROS:   Please see the history of present illness.     All other systems reviewed and are negative.  EKGs/Labs/Other Studies Reviewed:    The following studies were reviewed today:  Echo 05/17/2021 1. Left ventricular ejection fraction, by estimation, is 55 to 60%. The  left ventricle has normal function. The left ventricle has no regional  wall motion abnormalities. There is mild asymmetric left ventricular  hypertrophy of the basal-septal segment.  Left ventricular diastolic parameters were normal.   2. Right ventricular systolic function is normal. The right ventricular  size is normal. There is mildly elevated pulmonary artery systolic  pressure. The estimated right ventricular systolic pressure is 93.7 mmHg.   3. The mitral valve is normal in structure. Mild mitral valve  regurgitation.   4. The aortic valve is tricuspid. There is mild calcification of the  aortic valve. There is mild thickening of the aortic valve. Aortic valve  regurgitation is trivial.   5. The  inferior vena cava is normal in size with greater than 50%  respiratory variability, suggesting right atrial pressure of  3 mmHg.   Comparison(s): Compared to prior TTE in 01/2021, there is no significant  change.   EKG:  EKG is not ordered today.    Recent Labs: 06/17/2021: B Natriuretic Peptide 372.1; Hemoglobin 12.3; Platelets 245 12/21/2021: ALT 10; TSH 0.652 01/04/2022: BUN 22; Creatinine, Ser 1.03; Potassium 4.5; Sodium 132  Recent Lipid Panel    Component Value Date/Time   CHOL 133 07/15/2019 0934   CHOL 124 02/27/2017 0940   TRIG 48 07/15/2019 0934   HDL 59 07/15/2019 0934   HDL 58 02/27/2017 0940   CHOLHDL 2.3 07/15/2019 0934   VLDL 10 07/15/2019 0934   LDLCALC 64 07/15/2019 0934   LDLCALC 61 07/14/2018 1149     Risk Assessment/Calculations:    CHA2DS2-VASc Score = 5   This indicates a 7.2% annual risk of stroke. The patient's score is based upon: CHF History: 0 HTN History: 1 Diabetes History: 0 Stroke History: 0 Vascular Disease History: 1 Age Score: 2 Gender Score: 1          Physical Exam:    VS:  BP (!) 120/48 (BP Location: Left Arm, Patient Position: Sitting, Cuff Size: Normal)   Pulse (!) 45   Ht '4\' 11"'$  (1.499 m)   Wt 129 lb 3.2 oz (58.6 kg)   SpO2 98%   BMI 26.10 kg/m        Wt Readings from Last 3 Encounters:  01/23/22 129 lb 3.2 oz (58.6 kg)  01/04/22 126 lb 3.2 oz (57.2 kg)  12/21/21 126 lb (57.2 kg)     GEN:  Well nourished, well developed in no acute distress HEENT: Normal NECK: No JVD; No carotid bruits LYMPHATICS: No lymphadenopathy CARDIAC: RRR, no murmurs, rubs, gallops RESPIRATORY:  Clear to auscultation without rales, wheezing or rhonchi  ABDOMEN: Soft, non-tender, non-distended MUSCULOSKELETAL:  No edema; No deformity  SKIN: Warm and dry NEUROLOGIC:  Alert and oriented x 3 PSYCHIATRIC:  Normal affect   ASSESSMENT:    1. Claudication in peripheral vascular disease ([place])   2. Pre-procedural examination   3. Coronary  artery disease involving native coronary artery of native heart without angina pectoris   4. Essential hypertension   5. Hyperlipidemia LDL goal <70   6. PAF (paroxysmal atrial fibrillation) ([place])   7. Bradycardia    PLAN:    In order of problems listed above:  Lower extremity claudication: I recently evaluated the patient for lower extremity cyanosis, her feet will turn purple and blue about setting and return to normal color with standing.  I suspected arterial disease.  This was confirmed on ABI.  She has weakness in bilateral lower extremity.  We discussed possibility of proceeding with lower extremity angiography.  Risk and benefit of the upcoming procedure including major bleeding, vascular injury, kidney injury, heart attack, stroke loss of life or limb has been discussed with the patient and her husband.  She is willing to proceed.  We will obtain CBC and basic metabolic panel  CAD: Denies any chest pain  Hypertension: Blood pressure stable  Hyperlipidemia: On Lipitor  PAF: Currently on lower dose of amiodarone and Eliquis.  Patient is aware to hold Eliquis for 2 days prior to the procedure  Asymptomatic bradycardia: Previously amiodarone has been cut back by EP service, however patient remained bradycardic.  I discussed the case with Dr. Gwenlyn Found who is willing to proceed given the asymptomatic nature of bradycardia.  Patient will continue to be followed up by EP service.      Shared  Decision Making/Informed Consent The risks [stroke (1 in 1000), death (1 in 1000), kidney failure [usually temporary] (1 in 500), bleeding (1 in 200), allergic reaction [possibly serious] (1 in 200)], benefits (diagnostic support and management of coronary artery disease) and alternatives of a lower extremity angiography were discussed in detail with Brittany Kirk and she is willing to proceed.    Medication Adjustments/Labs and Tests Ordered: Current medicines are reviewed at length with the patient  today.  Concerns regarding medicines are outlined above.  Orders Placed This Encounter  Procedures   CBC   Basic Metabolic Panel (BMET)   No orders of the defined types were placed in this encounter.   Patient Instructions  Medication Instructions:  Your physician recommends that you continue on your current medications as directed. Please refer to the Current Medication list given to you today.  HOLD Lasix and Eliquis 2 days prior to your procedure.  *If you need a refill on your cardiac medications before your next appointment, please call your pharmacy*   Lab Work: Your physician recommends that you return for lab work in: Next Monday or Tuesday  CBC and BMET  If you have labs (blood work) drawn today and your tests are completely normal, you will receive your results only by: [place] (if you have MyChart) OR A paper copy in the mail If you have any lab test that is abnormal or we need to change your treatment, we will call you to review the results.   Testing/Procedures: NONE   Follow-Up: At [facility], you and your health needs are our priority.  As part of our continuing mission to provide you with exceptional heart care, we have created designated Provider Care Teams.  These Care Teams include your primary Cardiologist (physician) and Advanced Practice Providers (APPs -  Physician Assistants and Nurse Practitioners) who all work together to provide you with the care you need, when you need it.  We recommend signing up for the patient portal called "MyChart".  Sign up information is provided on this After Visit Summary.  MyChart is used to connect with patients for Virtual Visits (Telemedicine).  Patients are able to view lab/test results, encounter notes, upcoming appointments, etc.  Non-urgent messages can be sent to your provider as well.   To learn more about what you can do with MyChart, go to [email or web address].    Your next appointment:   Please keep  your upcoming appointment with Dr Gwenlyn Found 02/28/2022.     Hilbert Corrigan, [place]  01/26/2022 12:25 AM    [place]

## 2022-01-24 ENCOUNTER — Encounter: Payer: Self-pay | Admitting: Cardiovascular Disease

## 2022-01-26 ENCOUNTER — Other Ambulatory Visit: Payer: Self-pay | Admitting: Physician Assistant

## 2022-01-26 ENCOUNTER — Encounter: Payer: Self-pay | Admitting: Physician Assistant

## 2022-01-29 ENCOUNTER — Encounter: Payer: Self-pay | Admitting: Cardiovascular Disease

## 2022-01-30 ENCOUNTER — Other Ambulatory Visit: Payer: Self-pay

## 2022-01-30 DIAGNOSIS — Z01818 Encounter for other preprocedural examination: Secondary | ICD-10-CM

## 2022-01-31 LAB — BASIC METABOLIC PANEL
BUN/Creatinine Ratio: 17 (ref 12–28)
BUN: 19 mg/dL (ref 8–27)
CO2: 23 mmol/L (ref 20–29)
Calcium: 8.9 mg/dL (ref 8.7–10.3)
Chloride: 100 mmol/L (ref 96–106)
Creatinine, Ser: 1.11 mg/dL — ABNORMAL HIGH (ref 0.57–1.00)
Glucose: 105 mg/dL — ABNORMAL HIGH (ref 70–99)
Potassium: 5 mmol/L (ref 3.5–5.2)
Sodium: 137 mmol/L (ref 134–144)
eGFR: 51 mL/min/{1.73_m2} — ABNORMAL LOW (ref >59)

## 2022-01-31 LAB — CBC
Hematocrit: 35.1 % (ref 34.0–46.6)
Hemoglobin: 11.7 g/dL (ref 11.1–15.9)
MCH: 33 pg (ref 26.6–33.0)
MCHC: 33.3 g/dL (ref 31.5–35.7)
MCV: 99 fL — ABNORMAL HIGH (ref 79–97)
Platelets: 253 10*3/uL (ref 150–450)
RBC: 3.55 x10E6/uL — ABNORMAL LOW (ref 3.77–5.28)
RDW: 13.1 % (ref 11.7–15.4)
WBC: 9 10*3/uL (ref 3.4–10.8)

## 2022-02-01 ENCOUNTER — Telehealth: Payer: Self-pay | Admitting: *Deleted

## 2022-02-01 NOTE — Telephone Encounter (Signed)
Abdominal aortogram  scheduled at [facility] for: Monday February 05, 2022 12 Noon Arrival time and place: [place] Entrance A at: 10 AM   Nothing to eat after midnight prior to procedure, clear liquids until 5 AM day of procedure.  Medication instructions: -Hold:  Eliquis/Study medication-none 02/03/22 until post procedure  Valsartan/Lasix-day before and day of procedure-per protocol-GFR 51 -Except hold medications usual morning medications can be taken with sips of water including aspirin 81 mg.  Confirmed patient has responsible adult to drive home post procedure and be with patient first 24 hours after arriving home.  Patient reports no new symptoms concerning for COVID-19 in the past 10 days.  Reviewed procedure instructions with patient.

## 2022-02-05 ENCOUNTER — Encounter (HOSPITAL_COMMUNITY)
Admission: RE | Disposition: A | Discharge status: Home / Self Care | Payer: Self-pay | Source: Ambulatory Visit | Attending: Cardiovascular Disease

## 2022-02-05 ENCOUNTER — Ambulatory Visit (HOSPITAL_COMMUNITY)
Admission: RE | Admit: 2022-02-05 | Discharge: 2022-02-05 | Disposition: A | Discharge status: Home / Self Care | Payer: Medicare Other | Source: Ambulatory Visit | Attending: Cardiovascular Disease | Admitting: Cardiovascular Disease

## 2022-02-05 ENCOUNTER — Other Ambulatory Visit: Payer: Self-pay

## 2022-02-05 DIAGNOSIS — I6529 Occlusion and stenosis of unspecified carotid artery: Secondary | ICD-10-CM | POA: Insufficient documentation

## 2022-02-05 DIAGNOSIS — E785 Hyperlipidemia, unspecified: Secondary | ICD-10-CM | POA: Insufficient documentation

## 2022-02-05 DIAGNOSIS — F32A Depression, unspecified: Secondary | ICD-10-CM | POA: Insufficient documentation

## 2022-02-05 DIAGNOSIS — I2510 Atherosclerotic heart disease of native coronary artery without angina pectoris: Secondary | ICD-10-CM | POA: Diagnosis not present

## 2022-02-05 DIAGNOSIS — I739 Peripheral vascular disease, unspecified: Secondary | ICD-10-CM | POA: Insufficient documentation

## 2022-02-05 DIAGNOSIS — Z79899 Other long term (current) drug therapy: Secondary | ICD-10-CM | POA: Insufficient documentation

## 2022-02-05 DIAGNOSIS — Z87891 Personal history of nicotine dependence: Secondary | ICD-10-CM | POA: Diagnosis not present

## 2022-02-05 DIAGNOSIS — I480 Paroxysmal atrial fibrillation: Secondary | ICD-10-CM | POA: Insufficient documentation

## 2022-02-05 DIAGNOSIS — I10 Essential (primary) hypertension: Secondary | ICD-10-CM | POA: Diagnosis not present

## 2022-02-05 DIAGNOSIS — Z7901 Long term (current) use of anticoagulants: Secondary | ICD-10-CM | POA: Insufficient documentation

## 2022-02-05 DIAGNOSIS — R001 Bradycardia, unspecified: Secondary | ICD-10-CM | POA: Diagnosis not present

## 2022-02-05 HISTORY — PX: ABDOMINAL AORTOGRAM W/LOWER EXTREMITY: CATH118223

## 2022-02-05 SURGERY — ABDOMINAL AORTOGRAM W/LOWER EXTREMITY
Anesthesia: LOCAL

## 2022-02-05 MED ORDER — FENTANYL CITRATE (PF) 100 MCG/2ML IJ SOLN
INTRAMUSCULAR | Status: AC
  Filled 2022-02-05: qty 2

## 2022-02-05 MED ORDER — ONDANSETRON HCL 4 MG/2ML IJ SOLN: 4.0000 mg | Freq: Four times a day (QID) | INTRAMUSCULAR | Status: DC | PRN

## 2022-02-05 MED ORDER — HEPARIN (PORCINE) IN NACL 1000-0.9 UT/500ML-% IV SOLN
INTRAVENOUS | Status: AC
  Filled 2022-02-05: qty 1000

## 2022-02-05 MED ORDER — LABETALOL HCL 5 MG/ML IV SOLN: 10.0000 mg | INTRAVENOUS | Status: DC | PRN

## 2022-02-05 MED ORDER — ASPIRIN 81 MG PO CHEW: 81.0000 mg | CHEWABLE_TABLET | ORAL | Status: DC

## 2022-02-05 MED ORDER — LIDOCAINE HCL (PF) 1 % IJ SOLN
INTRAMUSCULAR | Status: DC | PRN
  Administered 2022-02-05: 15 mL

## 2022-02-05 MED ORDER — NITROGLYCERIN 1 MG/10 ML FOR IR/CATH LAB
INTRA_ARTERIAL | Status: DC | PRN
  Administered 2022-02-05: 200 ug via INTRA_ARTERIAL

## 2022-02-05 MED ORDER — FENTANYL CITRATE (PF) 100 MCG/2ML IJ SOLN
INTRAMUSCULAR | Status: DC | PRN
  Administered 2022-02-05 (×2): 25 ug via INTRAVENOUS

## 2022-02-05 MED ORDER — SODIUM CHLORIDE 0.9 % WEIGHT BASED INFUSION: 1.0000 mL/kg/h | INTRAVENOUS | Status: DC

## 2022-02-05 MED ORDER — HYDRALAZINE HCL 20 MG/ML IJ SOLN
INTRAMUSCULAR | Status: AC
  Filled 2022-02-05: qty 1

## 2022-02-05 MED ORDER — IODIXANOL 320 MG/ML IV SOLN
INTRAVENOUS | Status: DC | PRN
  Administered 2022-02-05: 115 mL via INTRA_ARTERIAL

## 2022-02-05 MED ORDER — HEPARIN (PORCINE) IN NACL 1000-0.9 UT/500ML-% IV SOLN
INTRAVENOUS | Status: DC | PRN
  Administered 2022-02-05 (×2): 500 mL

## 2022-02-05 MED ORDER — NITROGLYCERIN 1 MG/10 ML FOR IR/CATH LAB
INTRA_ARTERIAL | Status: AC
  Filled 2022-02-05: qty 10

## 2022-02-05 MED ORDER — MIDAZOLAM HCL 2 MG/2ML IJ SOLN
INTRAMUSCULAR | Status: AC
  Filled 2022-02-05: qty 2

## 2022-02-05 MED ORDER — SODIUM CHLORIDE 0.9 % IV SOLN: 250.0000 mL | INTRAVENOUS | Status: DC | PRN

## 2022-02-05 MED ORDER — MIDAZOLAM HCL 2 MG/2ML IJ SOLN
INTRAMUSCULAR | Status: DC | PRN
  Administered 2022-02-05 (×2): 1 mg via INTRAVENOUS

## 2022-02-05 MED ORDER — SODIUM CHLORIDE 0.9 % WEIGHT BASED INFUSION
3.0000 mL/kg/h | INTRAVENOUS | Status: AC
  Administered 2022-02-05: 3 mL/kg/h via INTRAVENOUS

## 2022-02-05 MED ORDER — SODIUM CHLORIDE 0.9 % IV SOLN: INTRAVENOUS | Status: AC

## 2022-02-05 MED ORDER — HYDRALAZINE HCL 20 MG/ML IJ SOLN
5.0000 mg | INTRAMUSCULAR | Status: DC | PRN
  Administered 2022-02-05: 5 mg via INTRAVENOUS

## 2022-02-05 MED ORDER — SODIUM CHLORIDE 0.9% FLUSH: 3.0000 mL | INTRAVENOUS | Status: DC | PRN

## 2022-02-05 MED ORDER — LIDOCAINE HCL (PF) 1 % IJ SOLN
INTRAMUSCULAR | Status: AC
  Filled 2022-02-05: qty 30

## 2022-02-05 MED ORDER — MORPHINE SULFATE (PF) 2 MG/ML IV SOLN: 2.0000 mg | INTRAVENOUS | Status: DC | PRN

## 2022-02-05 MED ORDER — ACETAMINOPHEN 325 MG PO TABS: 650.0000 mg | ORAL_TABLET | ORAL | Status: DC | PRN

## 2022-02-05 MED ORDER — SODIUM CHLORIDE 0.9% FLUSH: 3.0000 mL | Freq: Two times a day (BID) | INTRAVENOUS | Status: DC

## 2022-02-05 SURGICAL SUPPLY — 12 items
CATH ANGIO 5F PIGTAIL 65CM (CATHETERS) IMPLANT
CATH STRAIGHT 5FR 65CM (CATHETERS) IMPLANT
COVER DOME SNAP 22 D (MISCELLANEOUS) IMPLANT
KIT PV (KITS) ×1 IMPLANT
SHEATH PINNACLE 5F 10CM (SHEATH) IMPLANT
SHEATH PROBE COVER 6X72 (BAG) IMPLANT
STOPCOCK MORSE 400PSI 3WAY (MISCELLANEOUS) IMPLANT
SYR MEDRAD MARK 7 150ML (SYRINGE) ×1 IMPLANT
TRANSDUCER W/STOPCOCK (MISCELLANEOUS) ×1 IMPLANT
TRAY PV CATH (CUSTOM PROCEDURE TRAY) ×1 IMPLANT
TUBING CIL FLEX 10 FLL-RA (TUBING) IMPLANT
WIRE HITORQ VERSACORE ST 145CM (WIRE) IMPLANT

## 2022-02-05 NOTE — Interval H&P Note (Signed)
History and Physical Interval Note:  02/05/2022 12:58 PM  Brittany Kirk  has presented today for surgery, with the diagnosis of pad.  The various methods of treatment have been discussed with the patient and family. After consideration of risks, benefits and other options for treatment, the patient has consented to  Procedure(s): ABDOMINAL AORTOGRAM W/LOWER EXTREMITY (N/A) as a surgical intervention.  The patient's history has been reviewed, patient examined, no change in status, stable for surgery.  I have reviewed the patient's chart and labs.  Questions were answered to the patient's satisfaction.     [name]

## 2022-02-05 NOTE — Progress Notes (Signed)
Spoke with Brittany Kirk, APP-advised to have pt resume eloquis tomorrow

## 2022-02-05 NOTE — Progress Notes (Signed)
Site area: rt groin arterial sheath Site Prior to Removal:  Level 0 Pressure Applied For: 20 minutes Manual:   yes Patient Status During Pull:  stable Post Pull Site:  Level 0 Post Pull Instructions Given:  yes Post Pull Pulses Present: rt dp palpable Dressing Applied:  gauze and tegaderm Bedrest begins @ 1435 Comments:

## 2022-02-06 ENCOUNTER — Encounter (HOSPITAL_COMMUNITY): Payer: Self-pay | Admitting: Cardiovascular Disease

## 2022-02-14 ENCOUNTER — Encounter: Payer: Self-pay | Admitting: Cardiovascular Disease

## 2022-02-19 ENCOUNTER — Inpatient Hospital Stay (HOSPITAL_COMMUNITY): Admission: RE | Admit: 2022-02-19 | Payer: Medicare Other | Source: Ambulatory Visit

## 2022-02-27 ENCOUNTER — Ambulatory Visit: Payer: Medicare Other | Admitting: Cardiovascular Disease

## 2022-02-28 ENCOUNTER — Ambulatory Visit (INDEPENDENT_AMBULATORY_CARE_PROVIDER_SITE_OTHER): Payer: Medicare Other | Admitting: Cardiovascular Disease

## 2022-02-28 ENCOUNTER — Encounter: Payer: Self-pay | Admitting: Cardiovascular Disease

## 2022-02-28 DIAGNOSIS — I2510 Atherosclerotic heart disease of native coronary artery without angina pectoris: Secondary | ICD-10-CM | POA: Diagnosis not present

## 2022-02-28 DIAGNOSIS — I6523 Occlusion and stenosis of bilateral carotid arteries: Secondary | ICD-10-CM | POA: Diagnosis not present

## 2022-02-28 DIAGNOSIS — E782 Mixed hyperlipidemia: Secondary | ICD-10-CM

## 2022-02-28 DIAGNOSIS — I4891 Unspecified atrial fibrillation: Secondary | ICD-10-CM | POA: Diagnosis not present

## 2022-02-28 DIAGNOSIS — I739 Peripheral vascular disease, unspecified: Secondary | ICD-10-CM | POA: Diagnosis not present

## 2022-02-28 DIAGNOSIS — I10 Essential (primary) hypertension: Secondary | ICD-10-CM | POA: Diagnosis not present

## 2022-02-28 LAB — LIPID PANEL
Chol/HDL Ratio: 2.1 ratio (ref 0.0–4.4)
Cholesterol, Total: 152 mg/dL (ref 100–199)
HDL: 71 mg/dL (ref >39)
LDL Chol Calc (NIH): 70 mg/dL (ref 0–99)
Triglycerides: 49 mg/dL (ref 0–149)
VLDL Cholesterol Cal: 11 mg/dL (ref 5–40)

## 2022-02-28 LAB — HEPATIC FUNCTION PANEL
ALT: 13 IU/L (ref 0–32)
AST: 16 IU/L (ref 0–40)
Albumin: 3.8 g/dL (ref 3.8–4.8)
Alkaline Phosphatase: 54 IU/L (ref 44–121)
Bilirubin Total: 0.5 mg/dL (ref 0.0–1.2)
Bilirubin, Direct: 0.15 mg/dL (ref 0.00–0.40)
Total Protein: 6 g/dL (ref 6.0–8.5)

## 2022-02-28 NOTE — Progress Notes (Signed)
02/28/2022 Brittany Kirk   September 21, 1945  [id number]  Primary Physician Pickard, Cammie Mcgee, MD Primary Cardiologist: Lorretta Harp MD [name], [place]  HPI:  Brittany Kirk is a 76 y.o.   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 She was referred by Dr. Dennard Schaumann for cardiovascular evaluation because of chest pain. I last saw her in the office 12/20/2021.  Risk factors include 25 pack years of tobacco abuse having quit 3 years ago. She has treated hypertension as well as a strong family history of heart disease with multiple siblings who died at a premature age of myocardial infarction's. She has never had a heart attack or stroke. She has had a remote cardiac catheterization by Dr. Lia Foyer but no intervention was performed. She was complaining of some substernal chest pain with chronic nausea. This has since some sided after beginning on a proton pump inhibitor. She had a negative stress test 01/24/16 and again during a recent consultation for chest pain 01/05/17. She has had upper and lower endoscopy for abdominal pain and nausea which was unrevealing.   She had diagnostic coronary angiography performed by Dr. Martinique 5/19 showing moderately severe ramus branch ostial disease with noncritical disease otherwise a normal LV function.  This was unchanged from prior cath performed in 2010 it was thought that her chest pain was noncardiac in nature.   She was seen in the emergency room on 07/15/2019 for A. fib with RVR.  She converted spontaneously to sinus rhythm and was begun on apixaban by Dr. Harrell Gave.  She has had a few episodes of brief AF since but for the most part is asymptomatic from this.   She was admitted in early August with A. fib with RVR and converted.  Her troponins were mildly elevated.  Her 2D echo was normal.  She has had no recurrent episodes.  She she saw Dr.  Curt Bears for EP follow-up who had an A. fib ablation on 04/14/2021.  She saw Dr. Curt Bears because of symptomatic PVCs who began her on mexiletine.  Unfortunately, this is exacerbated her baseline tremor.  Otherwise she has remained stable.  She also complains of reflux type symptoms on Prilosec.  She still sees Dr. Lizbeth Bark  a gastroenterologist for this.  Because of her intolerance to mexiletine she saw Dr. Curt Bears again who began amiodarone.  He loaded her for 3 weeks and began her on 200 mg a day.  Since that time she has been symptomatically bradycardic.  She did see Almyra Deforest PA-C in the office 11/23/2021 complaining of dependent cyanosis and weakness in her legs.  Doppler studies performed 12/07/2021 revealed right ABI of 0.78 and left of 0.75 with high-frequency signals of both iliac arteries.  I performed peripheral angiography on her 02/05/2022 revealing 50% segmental in for renal abdominal aortic narrowing, 40% ostial right common iliac artery stenosis with a 30 mm pullback gradient across her aorta and iliac.  I do not think her iliac appears angiographically significant.  All her infrainguinal vasculature was widely patent.  I have no explanation for her dependent cyanosis.   Current Meds  Medication Sig   acetaminophen (TYLENOL) 500 MG tablet Take 1,000 mg by mouth every 6 (six) hours as needed for moderate pain or headache.   amiodarone (PACERONE) 200 MG tablet Take 0.5 tablets (100 mg total) by mouth daily.   apixaban (ELIQUIS) 5  MG TABS tablet Take 1 tablet (5 mg total) by mouth 2 (two) times daily.   ascorbic acid (VITAMIN C) 1000 MG tablet Take 1,000 mg by mouth daily.   atorvastatin (LIPITOR) 40 MG tablet TAKE 1 TABLET BY MOUTH EVERYDAY AT BEDTIME   Cholecalciferol (VITAMIN D) 125 MCG (5000 UT) CAPS Take 5,000 Units by mouth daily.   Cyanocobalamin (B-12) 1000 MCG TBCR Take 1,000 mcg by mouth daily.   diazepam (VALIUM) 5 MG tablet Take 5 mg by mouth as needed for sedation.    furosemide (LASIX) 20 MG tablet TAKE 1 TABLET BY MOUTH EVERY DAY AS NEEDED (Patient taking differently: Take 20 mg by mouth daily as needed (Ankle Swelling).)   Magnesium 100 MG TABS Take 100 mg by mouth daily.   Omeprazole 20 MG TBEC Take 20 mg by mouth every evening.   traZODone (DESYREL) 50 MG tablet TAKE 1/2 TO 1 TABLETS (25-50 MG TOTAL) BY MOUTH AT BEDTIME AS NEEDED. FOR SLEEP   valsartan (DIOVAN) 40 MG tablet Take 1 tablet (40 mg total) by mouth daily. Hold for Blood Pressure <120 (Patient taking differently: Take 40 mg by mouth 2 (two) times daily. Hold for Blood Pressure <120)   Zinc 30 MG CAPS Take 30 mg by mouth daily.     Allergies  Allergen Reactions   Penicillins Anaphylaxis, Itching, Swelling and Rash    Has patient had a PCN reaction causing immediate rash, facial/tongue/throat swelling, SOB or lightheadedness with hypotension: Yes Has patient had a PCN reaction causing severe rash involving mucus membranes or skin necrosis: No Has patient had a PCN reaction that required hospitalization: Yes Has patient had a PCN reaction occurring within the last 10 years: No If all of the above answers are "NO", then may proceed with Cephalosporin use.    Fosamax [Alendronate]     myalgias   Levofloxacin Nausea Only and Other (See Comments)    Causes headache, fatigue and dizziness   Mexiletine Itching, Nausea Only and Other (See Comments)   Cefaclor Itching, Swelling and Rash   Latex Itching and Rash   Metronidazole Itching, Swelling and Rash   Naproxen Itching, Swelling and Rash   Nsaids Itching, Swelling and Rash    Ibuprofen IS tolerated   Septra [Bactrim] Itching, Swelling and Rash   Sulfonamide Derivatives Itching, Swelling and Rash   Tape Itching and Rash    ONLY USE PAPER TAPE    Social History   Socioeconomic History   Marital status: Married    Spouse name: Psychologist, prison and probation services   Number of children: 1   Years of education: Not on file   Highest education level: Not on file   Occupational History   Occupation: retired truck Orthoptist: RETIRED  Tobacco Use   Smoking status: Former    Packs/day: 0.50    Years: 46.00    Total pack years: 23.00    Types: Cigarettes    Quit date: 02/13/2011    Years since quitting: 11.0   Smokeless tobacco: Never  Vaping Use   Vaping Use: Never used  Substance and Sexual Activity   Alcohol use: No   Drug use: No   Sexual activity: Not on file  Other Topics Concern   Not on file  Social History Narrative   Married since 1991, second marriage.    1 son   1 grandson   1 granddaughter   2 step children   3 step grandchildren   Social Determinants of Health  Financial Resource Strain: Low Risk  (05/11/2021)   Overall Financial Resource Strain (CARDIA)    Difficulty of Paying Living Expenses: Not hard at all  Food Insecurity: No Food Insecurity (05/11/2021)   Hunger Vital Sign    Worried About Running Out of Food in the Last Year: Never true    Ran Out of Food in the Last Year: Never true  Transportation Needs: No Transportation Needs (05/11/2021)   PRAPARE - Hydrologist (Medical): No    Lack of Transportation (Non-Medical): No  Physical Activity: Insufficiently Active (05/11/2021)   Exercise Vital Sign    Days of Exercise per Week: 3 days    Minutes of Exercise per Session: 20 min  Stress: Stress Concern Present (05/11/2021)   [place]    Feeling of Stress : To some extent  Social Connections: Socially Integrated (05/11/2021)   Social Connection and Isolation Panel [NHANES]    Frequency of Communication with Friends and Family: More than three times a week    Frequency of Social Gatherings with Friends and Family: More than three times a week    Attends Religious Services: More than 4 times per year    Active Member of [employer] or Organizations: Yes    Attends Music therapist: More than 4 times  per year    Marital Status: Married  Human resources officer Violence: Not At Risk (05/11/2021)   Humiliation, Afraid, Rape, and Kick questionnaire    Fear of Current or Ex-Partner: No    Emotionally Abused: No    Physically Abused: No    Sexually Abused: No     Review of Systems: General: negative for chills, fever, night sweats or weight changes.  Cardiovascular: negative for chest pain, dyspnea on exertion, edema, orthopnea, palpitations, paroxysmal nocturnal dyspnea or shortness of breath Dermatological: negative for rash Respiratory: negative for cough or wheezing Urologic: negative for hematuria Abdominal: negative for nausea, vomiting, diarrhea, bright red blood per rectum, melena, or hematemesis Neurologic: negative for visual changes, syncope, or dizziness All other systems reviewed and are otherwise negative except as noted above.    Blood pressure (!) 140/62, pulse (!) 50, height '4\' 11"'$  (1.499 m), weight 125 lb (56.7 kg).  General appearance: alert and no distress Neck: no adenopathy, no JVD, supple, symmetrical, trachea midline, thyroid not enlarged, symmetric, no tenderness/mass/nodules, and bilateral carotid bruits Lungs: clear to auscultation bilaterally Heart: regular rate and rhythm, S1, S2 normal, no murmur, click, rub or gallop Extremities: extremities normal, atraumatic, no cyanosis or edema Pulses: 2+ and symmetric Skin: Skin color, texture, turgor normal. No rashes or lesions Neurologic: Grossly normal  EKG not performed today  ASSESSMENT AND PLAN:   CAD in native artery History of CAD status post angiography by Dr. Martinique 5/19 showing a moderately severe ostial ramus branch stenosis unchanged from prior cath with otherwise noncritical CAD.  She denies chest pain.  Hyperlipidemia History of hyperlipidemia on statin therapy.  We will recheck a lipid liver profile today  Essential hypertension History of essential hypertension on Benicar which she takes once a  day.  I reviewed her blood pressure log which shows fairly labile hypertension with blood pressure swings from systolic in the low 737 range up to the 1 80-1 90 range.  Bilateral carotid artery disease (HCC) History of carotid artery disease with Dopplers performed 02/26/2020 revealing moderate bilateral ICA stenosis.  This is scheduled to be repeated tomorrow  Atrial fibrillation, rapid (  [place]) History of PAF status post ablation by Dr. Curt Bears 11//22.  She is on low-dose amiodarone for PVCs which has resolved that issue.  She remains on Eliquis oral anticoagulation free of A-fib.  Peripheral arterial disease (HCC) Lower extremity arterial Doppler studies performed 12/07/2021 revealing ABIs of 0.78 on the right and 0.75 on the left with what appeared to be high-grade bilateral proximal common iliac artery stenosis as well as moderate SFA disease.  She does have peripheral dependent cyanosis which actually gets better when she walks.  Based on this I performed peripheral angiography on her 02/05/2022 revealing 50% segmental diffuse in for renal abdominal aortic narrowing and a 40% ostial right common iliac artery stenosis with normal infrainguinal vasculature.  She did have a 30 mm pullback gradient across her distal abdominal aorta and right common iliac artery.  I did not think this was angiographically significant.  I have no explanation for her dependent cyanosis at this point.     Lorretta Harp MD FACP,FACC,FAHA, [facility] 02/28/2022 10:52 AM

## 2022-02-28 NOTE — Assessment & Plan Note (Signed)
History of CAD status post angiography by Dr. Martinique 5/19 showing a moderately severe ostial ramus branch stenosis unchanged from prior cath with otherwise noncritical CAD.  She denies chest pain.

## 2022-02-28 NOTE — Assessment & Plan Note (Signed)
History of essential hypertension on Benicar which she takes once a day.  I reviewed her blood pressure log which shows fairly labile hypertension with blood pressure swings from systolic in the low 116 range up to the 1 80-1 90 range.

## 2022-02-28 NOTE — Assessment & Plan Note (Signed)
Lower extremity arterial Doppler studies performed 12/07/2021 revealing ABIs of 0.78 on the right and 0.75 on the left with what appeared to be high-grade bilateral proximal common iliac artery stenosis as well as moderate SFA disease.  She does have peripheral dependent cyanosis which actually gets better when she walks.  Based on this I performed peripheral angiography on her 02/05/2022 revealing 50% segmental diffuse in for renal abdominal aortic narrowing and a 40% ostial right common iliac artery stenosis with normal infrainguinal vasculature.  She did have a 30 mm pullback gradient across her distal abdominal aorta and right common iliac artery.  I did not think this was angiographically significant.  I have no explanation for her dependent cyanosis at this point.

## 2022-02-28 NOTE — Assessment & Plan Note (Signed)
History of PAF status post ablation by Dr. Curt Bears 11//22.  She is on low-dose amiodarone for PVCs which has resolved that issue.  She remains on Eliquis oral anticoagulation free of A-fib.

## 2022-02-28 NOTE — Assessment & Plan Note (Signed)
History of carotid artery disease with Dopplers performed 02/26/2020 revealing moderate bilateral ICA stenosis.  This is scheduled to be repeated tomorrow

## 2022-02-28 NOTE — Patient Instructions (Signed)
Medication Instructions:  Your physician recommends that you continue on your current medications as directed. Please refer to the Current Medication list given to you today.  *If you need a refill on your cardiac medications before your next appointment, please call your pharmacy*   Lab Work: Your physician recommends that you have labs drawn today: Lipid/liver profile  If you have labs (blood work) drawn today and your tests are completely normal, you will receive your results only by: [place] (if you have MyChart) OR A paper copy in the mail If you have any lab test that is abnormal or we need to change your treatment, we will call you to review the results.   Follow-Up: At [facility], you and your health needs are our priority.  As part of our continuing mission to provide you with exceptional heart care, we have created designated Provider Care Teams.  These Care Teams include your primary Cardiologist (physician) and Advanced Practice Providers (APPs -  Physician Assistants and Nurse Practitioners) who all work together to provide you with the care you need, when you need it.  We recommend signing up for the patient portal called "MyChart".  Sign up information is provided on this After Visit Summary.  MyChart is used to connect with patients for Virtual Visits (Telemedicine).  Patients are able to view lab/test results, encounter notes, upcoming appointments, etc.  Non-urgent messages can be sent to your provider as well.   To learn more about what you can do with MyChart, go to [email or web address].    Your next appointment:   2 week(s)  The format for your next appointment:   In Person  Provider:   Dr. Fletcher Anon

## 2022-02-28 NOTE — Assessment & Plan Note (Signed)
History of hyperlipidemia on statin therapy.  We will recheck a lipid liver profile today. 

## 2022-03-01 ENCOUNTER — Ambulatory Visit (HOSPITAL_COMMUNITY)
Admission: RE | Admit: 2022-03-01 | Discharge: 2022-03-01 | Disposition: A | Discharge status: Home / Self Care | Payer: Medicare Other | Source: Ambulatory Visit | Attending: Cardiology | Admitting: Cardiology

## 2022-03-01 DIAGNOSIS — E782 Mixed hyperlipidemia: Secondary | ICD-10-CM | POA: Insufficient documentation

## 2022-03-01 DIAGNOSIS — I2510 Atherosclerotic heart disease of native coronary artery without angina pectoris: Secondary | ICD-10-CM | POA: Insufficient documentation

## 2022-03-01 DIAGNOSIS — I739 Peripheral vascular disease, unspecified: Secondary | ICD-10-CM | POA: Diagnosis not present

## 2022-03-01 DIAGNOSIS — I10 Essential (primary) hypertension: Secondary | ICD-10-CM | POA: Diagnosis not present

## 2022-03-01 DIAGNOSIS — I4891 Unspecified atrial fibrillation: Secondary | ICD-10-CM | POA: Insufficient documentation

## 2022-03-01 DIAGNOSIS — I6523 Occlusion and stenosis of bilateral carotid arteries: Secondary | ICD-10-CM | POA: Diagnosis not present

## 2022-03-06 ENCOUNTER — Encounter: Payer: Self-pay | Admitting: Cardiovascular Disease

## 2022-03-06 ENCOUNTER — Institutional Professional Consult (permissible substitution): Payer: Medicare Other | Admitting: Cardiovascular Disease

## 2022-03-06 ENCOUNTER — Ambulatory Visit: Payer: Medicare Other | Attending: Cardiovascular Disease | Admitting: Cardiovascular Disease

## 2022-03-06 VITALS — BP 150/70 | HR 51 | Ht 59.0 in | Wt 128.6 lb

## 2022-03-06 DIAGNOSIS — I739 Peripheral vascular disease, unspecified: Secondary | ICD-10-CM | POA: Diagnosis not present

## 2022-03-06 DIAGNOSIS — I6523 Occlusion and stenosis of bilateral carotid arteries: Secondary | ICD-10-CM | POA: Diagnosis not present

## 2022-03-06 DIAGNOSIS — Z01812 Encounter for preprocedural laboratory examination: Secondary | ICD-10-CM | POA: Diagnosis not present

## 2022-03-06 DIAGNOSIS — E782 Mixed hyperlipidemia: Secondary | ICD-10-CM | POA: Diagnosis not present

## 2022-03-06 DIAGNOSIS — I872 Venous insufficiency (chronic) (peripheral): Secondary | ICD-10-CM | POA: Diagnosis not present

## 2022-03-06 DIAGNOSIS — I10 Essential (primary) hypertension: Secondary | ICD-10-CM

## 2022-03-06 DIAGNOSIS — I2510 Atherosclerotic heart disease of native coronary artery without angina pectoris: Secondary | ICD-10-CM | POA: Diagnosis not present

## 2022-03-06 LAB — BASIC METABOLIC PANEL
BUN/Creatinine Ratio: 23 (ref 12–28)
BUN: 24 mg/dL (ref 8–27)
CO2: 26 mmol/L (ref 20–29)
Calcium: 8.8 mg/dL (ref 8.7–10.3)
Chloride: 105 mmol/L (ref 96–106)
Creatinine, Ser: 1.04 mg/dL — ABNORMAL HIGH (ref 0.57–1.00)
Glucose: 92 mg/dL (ref 70–99)
Potassium: 4.4 mmol/L (ref 3.5–5.2)
Sodium: 141 mmol/L (ref 134–144)
eGFR: 55 mL/min/{1.73_m2} — ABNORMAL LOW (ref >59)

## 2022-03-06 NOTE — Progress Notes (Signed)
Cardiology Office Note   Date:  03/06/2022   ID:  Brittany Kirk, DOB January 01, 1946, MRN [id number]  PCP:  Brittany Frizzle, MD  Cardiologist:  Dr. Gwenlyn Kirk  No chief complaint on file.     History of Present Illness: Brittany Kirk is a 76 y.o. female who was referred by Dr. Gwenlyn Kirk for a second opinion regarding management of peripheral arterial disease. She has known history of coronary artery disease managed medically, previous tobacco use quit in 2012, atrial fibrillation on long-term anticoagulation and amiodarone therapy, essential hypertension, hyperlipidemia and peripheral arterial disease. She was seen in June for dependent cyanosis and weakness in both legs.  She reports bilateral feet and calf discomfort with exertion.  In addition, both legs feel weak.  She has bilateral leg swelling that is worse at the end of the day and has purpleish discoloration of both feet especially in the dependent position. She underwent lower extremity Doppler studies which showed an ABI of 0.78 on the right and 0.75 on the left.  Duplex showed significantly elevated velocity in the mid abdominal aorta greater than 500 with elevated velocities in the proximal common iliac arteries. She subsequently underwent angiography which showed moderate stenosis in the abdominal aorta with minimal disease involving the common iliac arteries and no infrainguinal disease.  There was a 30 mm systolic gradient with pullback from the aorta into the iliac artery.   Past Medical History:  Diagnosis Date   Acid reflux    Allergic rhinitis    Anxiety    Asymptomatic bilateral carotid artery stenosis    40-59% (02/2018)   Atrial fibrillation ([place]) 07/2019   Breast cancer ([place]) 2015   Right Breast Cancer   CAD (coronary artery disease)    Depression    Emphysema    no longer uses inhalers   Gallstones    nausea, pain upper right abdomen   GERD (gastroesophageal reflux disease)    H/O hiatal hernia    Hiatal  hernia    History of skin cancer    Hyperlipidemia    Hypertension    Multinodular goiter (nontoxic)    Scoliosis    Skin cancer    Tobacco user    Wears dentures    top    Past Surgical History:  Procedure Laterality Date   ABDOMINAL AORTOGRAM W/LOWER EXTREMITY N/A 02/05/2022   Procedure: ABDOMINAL AORTOGRAM W/LOWER EXTREMITY;  Surgeon: Brittany Harp, MD;  Location: [facility];  Service: Cardiovascular;  Laterality: N/A;   ATRIAL FIBRILLATION ABLATION N/A 04/14/2021   Procedure: ATRIAL FIBRILLATION ABLATION;  Surgeon: Brittany Haw, MD;  Location: [place] [facility];  Service: Cardiovascular;  Laterality: N/A;   BREAST LUMPECTOMY Right 2015   CHOLECYSTECTOMY  07/08/2012   Procedure: LAPAROSCOPIC CHOLECYSTECTOMY WITH INTRAOPERATIVE CHOLANGIOGRAM;  Surgeon: Brittany Curry, MD,FACS;  Location: [facility];  Service: General;  Laterality: N/A;  Laparoscopic Cholecystectomy with Intraoperative Cholangiogram   COLONOSCOPY     ESOPHAGOSCOPY N/A 05/29/2017   Procedure: ESOPHAGOSCOPY;  Surgeon: Brittany Essex, MD;  Location: [place];  Service: Endoscopy;  Laterality: N/A;  botox injection   HAND SURGERY Left    wrist   LEFT HEART CATH AND CORONARY ANGIOGRAPHY N/A 10/11/2017   Procedure: LEFT HEART CATH AND CORONARY ANGIOGRAPHY;  Surgeon: Martinique, Peter M, MD;  Location: [place] [facility];  Service: Cardiovascular;  Laterality: N/A;   TONSILECTOMY, ADENOIDECTOMY, BILATERAL MYRINGOTOMY AND TUBES     TUBAL LIGATION  Current Outpatient Medications  Medication Sig Dispense Refill   acetaminophen (TYLENOL) 500 MG tablet Take 1,000 mg by mouth every 6 (six) hours as needed for moderate pain or headache.     amiodarone (PACERONE) 200 MG tablet Take 0.5 tablets (100 mg total) by mouth daily. 90 tablet 3   apixaban (ELIQUIS) 5 MG TABS tablet Take 1 tablet (5 mg total) by mouth 2 (two) times daily. 60 tablet 6   ascorbic acid (VITAMIN C) 1000 MG tablet Take 1,000 mg by mouth daily.      atorvastatin (LIPITOR) 40 MG tablet TAKE 1 TABLET BY MOUTH EVERYDAY AT BEDTIME 90 tablet 2   Cholecalciferol (VITAMIN D) 125 MCG (5000 UT) CAPS Take 5,000 Units by mouth daily.     Cyanocobalamin (B-12) 1000 MCG TBCR Take 1,000 mcg by mouth daily.     diazepam (VALIUM) 5 MG tablet Take 5 mg by mouth as needed for sedation.     furosemide (LASIX) 20 MG tablet TAKE 1 TABLET BY MOUTH EVERY DAY AS NEEDED (Patient taking differently: Take 20 mg by mouth daily as needed (Ankle Swelling).) 30 tablet 6   Magnesium 100 MG TABS Take 100 mg by mouth daily.     Omeprazole 20 MG TBEC Take 20 mg by mouth every evening.     traZODone (DESYREL) 50 MG tablet TAKE 1/2 TO 1 TABLETS (25-50 MG TOTAL) BY MOUTH AT BEDTIME AS NEEDED. FOR SLEEP 90 tablet 3   valsartan (DIOVAN) 40 MG tablet Take 1 tablet (40 mg total) by mouth daily. Hold for Blood Pressure <120 (Patient taking differently: Take 40 mg by mouth 2 (two) times daily. Hold for Blood Pressure <120) 90 tablet 3   Zinc 30 MG CAPS Take 30 mg by mouth daily.     No current facility-administered medications for this visit.    Allergies:   Penicillins, Fosamax [alendronate], Levofloxacin, Mexiletine, Cefaclor, Latex, Metronidazole, Naproxen, Nsaids, Septra [bactrim], Sulfonamide derivatives, and Tape    Social History:  The patient  reports that she quit smoking about 11 years ago. Her smoking use included cigarettes. She has a 23.00 pack-year smoking history. She has never used smokeless tobacco. She reports that she does not drink alcohol and does not use drugs.   Family History:  The patient's family history includes Aplastic anemia in an other family member; Arthritis in her mother; Diverticulitis in her sister; Heart disease in her sister and another family member; Pancreatic cancer in her mother.    ROS:  Please see the history of present illness.   Otherwise, review of systems are positive for none.   All other systems are reviewed and negative.     PHYSICAL EXAM: VS:  BP (!) 150/70   Pulse (!) 51   Ht '4\' 11"'$  (1.499 Kirk)   Wt 128 lb 9.6 oz (58.3 kg)   SpO2 98%   BMI 25.97 kg/Kirk  , BMI Body mass index is 25.97 kg/Kirk. GEN: Well nourished, well developed, in no acute distress  HEENT: normal  Neck: no JVD, carotid bruits, or masses Cardiac: RRR; no rubs, or gallops, 2 out of 6 systolic murmur in the aortic area.  Mild to moderate bilateral leg edema with significant cyanosis. Respiratory:  clear to auscultation bilaterally, normal work of breathing GI: soft, nontender, nondistended, + BS MS: no deformity or atrophy  Skin: warm and dry, no rash Neuro:  Strength and sensation are intact Psych: euthymic mood, full affect Vascular: Femoral pulses +1 bilaterally with abdominal bruit.  Dorsalis  pedis is +1 bilaterally.  Posterior tibial is not palpable.   EKG:  EKG is not ordered today.    Recent Labs: 06/17/2021: B Natriuretic Peptide 372.1 12/21/2021: TSH 0.652 01/30/2022: BUN 19; Creatinine, Ser 1.11; Hemoglobin 11.7; Platelets 253; Potassium 5.0; Sodium 137 02/28/2022: ALT 13    Lipid Panel    Component Value Date/Time   CHOL 152 02/28/2022 1108   TRIG 49 02/28/2022 1108   HDL 71 02/28/2022 1108   CHOLHDL 2.1 02/28/2022 1108   CHOLHDL 2.3 07/15/2019 0934   VLDL 10 07/15/2019 0934   LDLCALC 70 02/28/2022 1108   LDLCALC 61 07/14/2018 1149      Wt Readings from Last 3 Encounters:  03/06/22 128 lb 9.6 oz (58.3 kg)  02/28/22 125 lb (56.7 kg)  02/05/22 125 lb 3.2 oz (56.8 kg)          11/21/2021    9:14 AM  PAD Screen  Previous PAD dx? No  Previous surgical procedure? No  Pain with walking? No  Feet/toe relief with dangling? No  Painful, non-healing ulcers? No  Extremities discolored? Yes      ASSESSMENT AND PLAN:  1.  Peripheral arterial disease: I personally reviewed her Doppler studies and angiogram images.  I suspect that there might be underestimation of the stenosis in the abdominal aorta.  By  Doppler, there was clearly significant gradient across that area.  The elevated velocities in the common iliac arteries are likely reflective of the stenosis in the abdominal aorta.  To best evaluate this, I recommend CTA of the abdominal aorta which should give a better idea about luminal narrowing.  2.  Coronary artery disease involving native coronary arteries without angina: Continue medical therapy.  3.  Essential hypertension: Blood pressure is mildly elevated today.  Continue to monitor and consider increasing valsartan if needed.  4.  Hyperlipidemia: Continue atorvastatin with a target LDL of less than 70  5.  Moderate bilateral carotid disease: Stable on most recent Doppler.  6.  Paroxysmal atrial fibrillation: Currently in sinus rhythm.  She is on amiodarone and Eliquis.  7.  Chronic venous insufficiency: I suspect that her lower extremity swelling and dependent cyanosis is likely due to this.  I explained to her that even with revascularization of the abdominal aortic stenosis, I do not expect this to improve.  Recommend continuing knee-high support stocking during the day and leg elevation.    Disposition:   FU to be determined based on the results of CTA.  Signed,  Kathlyn Sacramento, MD  03/06/2022 10:12 AM    [place]

## 2022-03-06 NOTE — Patient Instructions (Signed)
Medication Instructions:  Your physician recommends that you continue on your current medications as directed. Please refer to the Current Medication list given to you today.  *If you need a refill on your cardiac medications before your next appointment, please call your pharmacy*   Lab Work: TODAY: BMET If you have labs (blood work) drawn today and your tests are completely normal, you will receive your results only by: [place] (if you have MyChart) OR A paper copy in the mail If you have any lab test that is abnormal or we need to change your treatment, we will call you to review the results.   Testing/Procedures: Non-Cardiac CT Angiography (CTA), is a special type of CT scan that uses a computer to produce multi-dimensional views of major blood vessels throughout the body. In CT angiography, a contrast material is injected through an IV to help visualize the blood vessels     Follow-Up: At [facility], you and your health needs are our priority.  As part of our continuing mission to provide you with exceptional heart care, we have created designated Provider Care Teams.  These Care Teams include your primary Cardiologist (physician) and Advanced Practice Providers (APPs -  Physician Assistants and Nurse Practitioners) who all work together to provide you with the care you need, when you need it.  We recommend signing up for the patient portal called "MyChart".  Sign up information is provided on this After Visit Summary.  MyChart is used to connect with patients for Virtual Visits (Telemedicine).  Patients are able to view lab/test results, encounter notes, upcoming appointments, etc.  Non-urgent messages can be sent to your provider as well.   To learn more about what you can do with MyChart, go to [email or web address].    Your next appointment:   Will be determined after scan  The format for your next appointment:   In Person  Provider:   Kathlyn Sacramento,  MD  Other Instructions   Important Information About Sugar

## 2022-03-09 ENCOUNTER — Other Ambulatory Visit: Payer: Self-pay | Admitting: Family Medicine

## 2022-03-09 NOTE — Telephone Encounter (Signed)
Requested Prescriptions  Pending Prescriptions Disp Refills  . atorvastatin (LIPITOR) 40 MG tablet [Pharmacy Med Name: ATORVASTATIN 40 MG TABLET] 90 tablet 0    Sig: TAKE 1 TABLET BY MOUTH EVERYDAY AT BEDTIME     Cardiovascular:  Antilipid - Statins Failed - 03/09/2022  9:02 AM      Failed - Lipid Panel in normal range within the last 12 months    Cholesterol, Total  Date Value Ref Range Status  02/28/2022 152 100 - 199 mg/dL Final   LDL Cholesterol (Calc)  Date Value Ref Range Status  07/14/2018 61 mg/dL (calc) Final    Comment:    Reference range: <100 . Desirable range <100 mg/dL for primary prevention;   <70 mg/dL for patients with CHD or diabetic patients  with > or = 2 CHD risk factors. Marland Kitchen LDL-C is now calculated using the Martin-Hopkins  calculation, which is a validated novel method providing  better accuracy than the Friedewald equation in the  estimation of LDL-C.  Cresenciano Genre et al. Annamaria Helling. 7591;638(46): 2061-2068  (http://education.[employer].com/faq/FAQ164)    LDL Chol Calc (NIH)  Date Value Ref Range Status  02/28/2022 70 0 - 99 mg/dL Final   HDL  Date Value Ref Range Status  02/28/2022 71 >39 mg/dL Final   Triglycerides  Date Value Ref Range Status  02/28/2022 49 0 - 149 mg/dL Final         Passed - Patient is not pregnant      Passed - Valid encounter within last 12 months    Recent Outpatient Visits          8 months ago Paroxysmal atrial fibrillation ([place])   [place] Susy Frizzle, MD   9 months ago Viral upper respiratory tract infection   [place] Eulogio Bear, NP   11 months ago [place] Dennard Schaumann, Cammie Mcgee, MD   11 months ago Strain of lumbar region, initial encounter   [place] Pickard, Cammie Mcgee, MD   1 year ago Left hip pain   [place] Pickard, Cammie Mcgee, MD

## 2022-03-15 ENCOUNTER — Other Ambulatory Visit: Payer: Self-pay | Admitting: Cardiovascular Disease

## 2022-03-15 ENCOUNTER — Ambulatory Visit (HOSPITAL_COMMUNITY)
Admission: RE | Admit: 2022-03-15 | Discharge: 2022-03-15 | Disposition: A | Discharge status: Home / Self Care | Payer: Medicare Other | Source: Ambulatory Visit | Attending: Cardiovascular Disease | Admitting: Cardiovascular Disease

## 2022-03-15 DIAGNOSIS — I739 Peripheral vascular disease, unspecified: Secondary | ICD-10-CM

## 2022-03-15 MED ORDER — IOHEXOL 350 MG/ML SOLN
80.0000 mL | Freq: Once | INTRAVENOUS | Status: AC | PRN
  Administered 2022-03-15: 80 mL via INTRAVENOUS

## 2022-03-16 ENCOUNTER — Encounter: Payer: Self-pay | Admitting: Cardiology

## 2022-03-23 ENCOUNTER — Encounter: Payer: Self-pay | Admitting: Cardiovascular Disease

## 2022-03-25 NOTE — Progress Notes (Unsigned)
Cardiology Office Note Date:  03/26/2022  Patient ID:  Brittany Kirk, DOB June 04, 1946, MRN [id number] PCP:  Susy Frizzle, MD  Cardiologist:  Dr. Gwenlyn Found Electrophysiologist: Dr. Curt Bears     Chief Complaint:  3 mo  History of Present Illness: Brittany Kirk is a 76 y.o. female with history of CAD (diagnostic coronary angiography performed by Dr. Martinique 5/19 showing moderately severe ramus branch ostial disease with noncritical disease otherwise a normal LV function.  This was unchanged from prior cath performed in 2010), HTN, HLD, essential tremor, AFib, PVCs  She saw Dr. Curt Bears 08/08/21, AFib well controlled though having symptomatic PVCs (17%) and started on mexiletine.  She saw Dr. Gwenlyn Found 08/31/21, felt her tremor was worse on mexiletine and stopped with plans to refer back to Dr. Curt Bears for alternative management options, no other changes made.  She saw Dr. Curt Bears 09/19/21, again with symptomatic PVCs off the mexiletine.  Felt a poor ablation candidate given fraility, started on amiodarone '200mg'$  BID for a month > daily.  Given a few days of lasix for edema.  Following with [facility] team for her labile  BPs/HTN,  During these visits her metoprolol stopped for HRs 40s and fatigue (April), lasix resumed,  BP improved as well as fatigue, taking her lasix prn, further adjustments in ARB at subsequent visits.  Discussed what sounds like dependent rubor  She saw H. Meng, PA-C 11/21/21, poor pedal pulses noted, added amlodipine for vasodilation and planned for ABIs Studies noted mod vascular disease b/l, recommended that she see Dr. Gwenlyn Found for further management/recommendations  Saw Dr. Gwenlyn Found 12/20/21, pending peripheral angiography possible intervention 02/05/22  I saw her 12/21/21 She is accompanied by her husband. She has not felt well for a couple years, tired, no energy, this has gotten worse, markedly so in the last 5-6 months. She is a bit emotional, very tired of feeling tired, all  she does these days is go to doctors. It is all she can do to barely keep her house clean. When her BP is low she feels lightheaded. She does though say that she no longer feels the PVCs, palpitations. Reports her HR has been 40's even before the addition of any medicines, including amiodarone. No near syncope or syncope. With ambulation she does have claudication, and SOB, not sure typically which happens 1st No CP We walked today, from Pod J to check out and back, her legs were getting painful and abit weak, no SOB, minimal HR excursion to 48 Remains generally very tired, no energy I had previously review her case with Dr. Curt Bears, agreed to reduce amio and monitor rate/symptoms, not yet clear need for pacing  We had a tele visit 01/04/22 She is not having PVCs or AFib HRs always in the 40's, only 2 times did it get to 50.  Regardless of walking/activity/rest. No CP No SOB No syncope Once when her BP got <100 she felt near faint Remains generally very tired, no energy Planned to give it another couple weeks on reduced dose to evaluate for HR improvement, to see Dr. Curt Bears , she was pending procedure for her PVD. I advised her to stop amlodipine with reports of low BPs and hold parameters for losartan.  She underwent peripheral angiography on 02/05/2022 w/Dr. Gwenlyn Found revealing 50% segmental in for renal abdominal aortic narrowing, 40% ostial right common iliac artery stenosis with a 30 mm pullback gradient across her aorta and iliac.   He did not think her iliac appears angiographically significant.  All her infrainguinal vasculature was widely patent.   no explanation for her dependent cyanosis. HR was 50 at this visit  She saw Dr. Fletcher Anon, 03/06/22, he felt that there might be underestimation of the stenosis in the abdominal aorta, recommended CTA of the abdominal aorta. suspected that her lower extremity swelling and dependent cyanosis is likely due to chronic venous insufficiency.  That  even with revascularization of the abdominal aortic stenosis, did not expect this to improve.  Recommended continuing knee-high support stocking during the day and leg elevation HR this visit was 68   CT completed, planned to see Dr. Fletcher Anon tomorrow  TODAY NO symptoms of PVCs or AFib She is getting some lightheaded spells, most predictably upon standing, but can happen when she is up and about as well. No syncope. No seated lightheadedness or dizzy spells She is tired, no energy B is usually high but will rapidly change/go down, they bought a new BPmachine Her HRs 40's and the 50', never higher then 50's Legs continue to get weak/fatigued pretty quickly with some tingling in her toes b/l  She has found that she is irritable, gets agitated quickly Not sleeping well, having bad dreams tremulous    AFib/AAD hx Diagnosed Feb 2021 PVI ablation 04/14/21 Mexiletine started for PVCs started Feb 2023 > stopped March 2023 with increase in her tremor Amiodarone started April 2023   Past Medical History:  Diagnosis Date   Acid reflux    Allergic rhinitis    Anxiety    Asymptomatic bilateral carotid artery stenosis    40-59% (02/2018)   Atrial fibrillation ([place]) 07/2019   Breast cancer ([place]) 2015   Right Breast Cancer   CAD (coronary artery disease)    Depression    Emphysema    no longer uses inhalers   Gallstones    nausea, pain upper right abdomen   GERD (gastroesophageal reflux disease)    H/O hiatal hernia    Hiatal hernia    History of skin cancer    Hyperlipidemia    Hypertension    Multinodular goiter (nontoxic)    Scoliosis    Skin cancer    Tobacco user    Wears dentures    top    Past Surgical History:  Procedure Laterality Date   ABDOMINAL AORTOGRAM W/LOWER EXTREMITY N/A 02/05/2022   Procedure: ABDOMINAL AORTOGRAM W/LOWER EXTREMITY;  Surgeon: Lorretta Harp, MD;  Location: [facility];  Service: Cardiovascular;  Laterality: N/A;   ATRIAL FIBRILLATION  ABLATION N/A 04/14/2021   Procedure: ATRIAL FIBRILLATION ABLATION;  Surgeon: Constance Haw, MD;  Location: [facility];  Service: Cardiovascular;  Laterality: N/A;   BREAST LUMPECTOMY Right 2015   CHOLECYSTECTOMY  07/08/2012   Procedure: LAPAROSCOPIC CHOLECYSTECTOMY WITH INTRAOPERATIVE CHOLANGIOGRAM;  Surgeon: Gayland Curry, MD,FACS;  Location: [facility];  Service: General;  Laterality: N/A;  Laparoscopic Cholecystectomy with Intraoperative Cholangiogram   COLONOSCOPY     ESOPHAGOSCOPY N/A 05/29/2017   Procedure: ESOPHAGOSCOPY;  Surgeon: Clarene Essex, MD;  Location: [facility];  Service: Endoscopy;  Laterality: N/A;  botox injection   HAND SURGERY Left    wrist   LEFT HEART CATH AND CORONARY ANGIOGRAPHY N/A 10/11/2017   Procedure: LEFT HEART CATH AND CORONARY ANGIOGRAPHY;  Surgeon: Martinique, Peter M, MD;  Location: [facility];  Service: Cardiovascular;  Laterality: N/A;   TONSILECTOMY, ADENOIDECTOMY, BILATERAL MYRINGOTOMY AND TUBES     TUBAL LIGATION      Current Outpatient Medications  Medication Sig Dispense  Refill   acetaminophen (TYLENOL) 500 MG tablet Take 1,000 mg by mouth every 6 (six) hours as needed for moderate pain or headache.     amiodarone (PACERONE) 200 MG tablet Take 0.5 tablets (100 mg total) by mouth daily. 90 tablet 3   apixaban (ELIQUIS) 5 MG TABS tablet Take 1 tablet (5 mg total) by mouth 2 (two) times daily. 60 tablet 6   ascorbic acid (VITAMIN C) 1000 MG tablet Take 1,000 mg by mouth daily.     atorvastatin (LIPITOR) 40 MG tablet TAKE 1 TABLET BY MOUTH EVERYDAY AT BEDTIME 90 tablet 0   Cholecalciferol (VITAMIN D) 125 MCG (5000 UT) CAPS Take 5,000 Units by mouth daily.     Cyanocobalamin (B-12) 1000 MCG TBCR Take 1,000 mcg by mouth daily.     diazepam (VALIUM) 5 MG tablet Take 5 mg by mouth as needed for sedation.     furosemide (LASIX) 20 MG tablet TAKE 1 TABLET BY MOUTH EVERY DAY AS NEEDED 30 tablet 6   Magnesium 100 MG TABS Take 100 mg by mouth daily.      Omeprazole 20 MG TBEC Take 20 mg by mouth every evening.     valsartan (DIOVAN) 40 MG tablet Take 1 tablet (40 mg total) by mouth daily. Hold for Blood Pressure <120 90 tablet 3   Zinc 30 MG CAPS Take 30 mg by mouth daily.     No current facility-administered medications for this visit.    Allergies:   Penicillins, Fosamax [alendronate], Levofloxacin, Mexiletine, Cefaclor, Latex, Metronidazole, Naproxen, Nsaids, Septra [bactrim], Sulfonamide derivatives, and Tape   Social History:  The patient  reports that she quit smoking about 11 years ago. Her smoking use included cigarettes. She has a 23.00 pack-year smoking history. She has never used smokeless tobacco. She reports that she does not drink alcohol and does not use drugs.   Family History:  The patient's family history includes Aplastic anemia in an other family member; Arthritis in her mother; Diverticulitis in her sister; Heart disease in her sister and another family member; Pancreatic cancer in her mother.  ROS:  Please see the history of present illness.    All other systems are reviewed and otherwise negative.   PHYSICAL EXAM:  VS:  BP 138/80   Pulse (!) 52   Ht '4\' 11"'$  (1.499 m)   Wt 124 lb 3.2 oz (56.3 kg)   SpO2 98%   BMI 25.09 kg/m  BMI: Body mass index is 25.09 kg/m. Well nourished, well developed, in no acute distress HEENT: normocephalic, atraumatic Neck: no JVD, carotid bruits or masses Cardiac:    RRR; bradycardic, no significant murmurs, no rubs, or gallops Lungs:    CTA b/l, no wheezing, rhonchi or rales Abd: soft, nontender MS: no deformity or atrophy Ext:  no edema, she has support stockings on, trace edema Skin: warm and dry, no rash Neuro:  No gross deficits appreciated Psych: euthymic mood, full affect   EKG:  Done today and reviewed by myself shows  SB 52bpm  03/15/22 CT abd/pelvis IMPRESSION: VASCULAR 1. No evidence of abdominal aortic aneurysm. 2. Diffusely calcified aortic atherosclerotic  disease with small caliber aorta measuring only 6 mm at its narrowest point. Imaging features can be seen in patients with small aorta syndrome (bilateral lower extremity claudication, usually gluteal and hip/upper thigh). 3. Moderate stenosis of the origin of the right renal artery. NON-VASCULAR 1. No acute abnormality within the abdomen or pelvis. 2. Stable chronic compression fractures at T11, L2,  L4 and L5. 3. Large volume of formed stool throughout the colon suggests constipation. 4. Cardiomegaly with right heart enlargement.  02/05/22: Peripheral runoff Angiographic Data:  1: Abdominal aorta-renal arteries widely patent.  The infrarenal abdominal aorta was moderately narrowed/atherosclerotic. 2: Left lower extremity-iliacs widely patent as was the SFA with three-vessel runoff 3: Right lower extremity-30 to 40% ostial right common iliac artery stenosis.  The remainder of the iliac arteries were widely patent.  The SFA was widely patent.  There is three-vessel runoff   12/07/21: LE ABIs Summary:  Right: Resting right ankle-brachial index indicates moderate right lower  extremity arterial disease. The right toe-brachial index is abnormal.   Left: Resting left ankle-brachial index indicates moderate left lower  extremity arterial disease. The left toe-brachial index is abnormal.   05/17/2021: TTE 1. Left ventricular ejection fraction, by estimation, is 55 to 60%. The  left ventricle has normal function. The left ventricle has no regional  wall motion abnormalities. There is mild asymmetric left ventricular  hypertrophy of the basal-septal segment.  Left ventricular diastolic parameters were normal.   2. Right ventricular systolic function is normal. The right ventricular  size is normal. There is mildly elevated pulmonary artery systolic  pressure. The estimated right ventricular systolic pressure is 24.2 mmHg.   3. The mitral valve is normal in structure. Mild mitral valve   regurgitation.   4. The aortic valve is tricuspid. There is mild calcification of the  aortic valve. There is mild thickening of the aortic valve. Aortic valve  regurgitation is trivial.   5. The inferior vena cava is normal in size with greater than 50%  respiratory variability, suggesting right atrial pressure of 3 mmHg.   Comparison(s): Compared to prior TTE in 01/2021, there is no significant  change.   04/14/21: EPS/ablation CONCLUSIONS: 1. Sinus rhythm upon presentation.   2. Successful electrical isolation and anatomical encircling of all four pulmonary veins with radiofrequency current. 3. No inducible arrhythmias following ablation both on and off of dobutamine 4. No early apparent complications.  Recent Labs: 06/17/2021: B Natriuretic Peptide 372.1 12/21/2021: TSH 0.652 01/30/2022: Hemoglobin 11.7; Platelets 253 02/28/2022: ALT 13 03/06/2022: BUN 24; Creatinine, Ser 1.04; Potassium 4.4; Sodium 141  02/28/2022: Chol/HDL Ratio 2.1; Cholesterol, Total 152; HDL 71; LDL Chol Calc (NIH) 70; Triglycerides 49   Estimated Creatinine Clearance: 34.1 mL/min (A) (by C-G formula based on SCr of 1.04 mg/dL (H)).   Wt Readings from Last 3 Encounters:  03/26/22 124 lb 3.2 oz (56.3 kg)  03/06/22 128 lb 9.6 oz (58.3 kg)  02/28/22 125 lb (56.7 kg)     Other studies reviewed: Additional studies/records reviewed today include: summarized above  ASSESSMENT AND PLAN:  Paroxysmal AFib CHA2DS2Vasc is 5, on Eliquis,  appropriately dosed She does not think she has had any AFib  PVCs Controlled with amiodarone Labs are UTD  3. SB Ambulated from Pod J to exit hallway, with some leg weakness turned around HR fluctuated between 53-60, mostly 53-56 NoSOB Discussed stopping amiodarone, but she reports the PVCs made her feel bad too Discussed perhaps needing a PPM She would like to further reduce the amiodarone 1st Will go to '100mg'$  QOD Will review with Dr. Curt Bears as well 2 week monitor for  HR, PVC burden  4. Aortic stenosis CT as above Sees Dr. Fletcher Anon tomorrow  5. HTN Home BP with very wide ranging reading Deferred to Dr. Fletcher Anon, and or Dr. Gwenlyn Found   Disposition: plan for a month with Dr. Curt Bears, sooner  if needed  Current medicines are reviewed at length with the patient today.  The patient did not have any concerns regarding medicines.  Venetia Night, PA-C 03/26/2022 1:37 PM     [place] [address] [address] [phone] (office)  [phone] (fax)

## 2022-03-26 ENCOUNTER — Ambulatory Visit: Payer: Medicare Other | Attending: Physician Assistant | Admitting: Physician Assistant

## 2022-03-26 ENCOUNTER — Other Ambulatory Visit (INDEPENDENT_AMBULATORY_CARE_PROVIDER_SITE_OTHER): Payer: Medicare Other

## 2022-03-26 ENCOUNTER — Encounter: Payer: Self-pay | Admitting: Physician Assistant

## 2022-03-26 ENCOUNTER — Encounter: Payer: Self-pay | Admitting: Cardiology

## 2022-03-26 VITALS — BP 138/80 | HR 52 | Ht 59.0 in | Wt 124.2 lb

## 2022-03-26 DIAGNOSIS — I493 Ventricular premature depolarization: Secondary | ICD-10-CM | POA: Diagnosis not present

## 2022-03-26 DIAGNOSIS — I480 Paroxysmal atrial fibrillation: Secondary | ICD-10-CM | POA: Insufficient documentation

## 2022-03-26 DIAGNOSIS — R001 Bradycardia, unspecified: Secondary | ICD-10-CM | POA: Diagnosis not present

## 2022-03-26 DIAGNOSIS — I6523 Occlusion and stenosis of bilateral carotid arteries: Secondary | ICD-10-CM | POA: Diagnosis not present

## 2022-03-26 DIAGNOSIS — R42 Dizziness and giddiness: Secondary | ICD-10-CM

## 2022-03-26 MED ORDER — AMIODARONE HCL 200 MG PO TABS: 100.0000 mg | ORAL_TABLET | ORAL | 3 refills | Status: DC

## 2022-03-26 MED ORDER — AMIODARONE HCL 100 MG PO TABS: 100.0000 mg | ORAL_TABLET | ORAL | 3 refills | Status: DC

## 2022-03-26 NOTE — Addendum Note (Signed)
Addended by: Thora Lance on: 03/26/2022 05:12 PM   Modules accepted: Orders

## 2022-03-26 NOTE — Progress Notes (Unsigned)
Enrolled for [employer] to mail a ZIO AT Live Telemetry monitor to patients address on file.  Dr. Curt Bears to read.

## 2022-03-26 NOTE — Patient Instructions (Addendum)
Medication Instructions:    START TAKING AMIODARONE 100 MG  EVERY OTHER DAY    *If you need a refill on your cardiac medications before your next appointment, please call your pharmacy*   Lab Work: NONE ORDERED  TODAY    If you have labs (blood work) drawn today and your tests are completely normal, you will receive your results only by: [place] (if you have MyChart) OR A paper copy in the mail If you have any lab test that is abnormal or we need to change your treatment, we will call you to review the results.   Testing/Procedures: Your physician has recommended that you wear an event monitor. Event monitors are medical devices that record the heart's electrical activity. Doctors most often [place] these monitors to diagnose arrhythmias. Arrhythmias are problems with the speed or rhythm of the heartbeat. The monitor is a small, portable device. You can wear one while you do your normal daily activities. This is usually used to diagnose what is causing palpitations/syncope (passing out).    Follow-Up: At [facility], you and your health needs are our priority.  As part of our continuing mission to provide you with exceptional heart care, we have created designated Provider Care Teams.  These Care Teams include your primary Cardiologist (physician) and Advanced Practice Providers (APPs -  Physician Assistants and Nurse Practitioners) who all work together to provide you with the care you need, when you need it.  We recommend signing up for the patient portal called "MyChart".  Sign up information is provided on this After Visit Summary.  MyChart is used to connect with patients for Virtual Visits (Telemedicine).  Patients are able to view lab/test results, encounter notes, upcoming appointments, etc.  Non-urgent messages can be sent to your provider as well.   To learn more about what you can do with MyChart, go to [email or web address].    Your next appointment:    ( 4 -  6 WEEKS)  The format for your next appointment:   In Person  Provider:   Allegra Lai, MD  ONLY   Other Instructions  ZIO AT Long term monitor-Live Telemetry  Your physician has requested you wear a ZIO patch monitor for 14 days.  This is a single patch monitor. [employer] supplies one patch monitor per enrollment. Additional  stickers are not available.  Please do not apply patch if you will be having a Nuclear Stress Test, Echocardiogram, Cardiac CT, MRI,  or Chest Xray during the period you would be wearing the monitor. The patch cannot be worn during  these tests. You cannot remove and re-apply the ZIO AT patch monitor.  Your ZIO patch monitor will be mailed 3 day USPS to your address on file. It may take 3-5 days to  receive your monitor after you have been enrolled.  Once you have received your monitor, please review the enclosed instructions. Your monitor has  already been registered assigning a specific monitor serial # to you.   Billing and Patient Assistance Program information  Theodore Demark has been supplied with any insurance information on record for billing. [employer] offers a sliding scale Patient Assistance Program for patients without insurance, or whose  insurance does not completely cover the cost of the ZIO patch monitor. You must apply for the  Patient Assistance Program to qualify for the discounted rate. To apply, call [employer] at [phone],  select option 4, select option 2 , ask to apply for the Patient Assistance Program, (you can  request an  interpreter if needed). [employer] will ask your household income and how many people are in your  household. [employer] will quote your out-of-pocket cost based on this information. They will also be able  to set up a 12 month interest free payment plan if needed.  Applying the monitor   Shave hair from upper left chest.  Hold the abrader disc by orange tab. Rub the abrader in 40 strokes over left upper chest as indicated in   your monitor instructions.  Clean area with 4 enclosed alcohol pads. Use all pads to ensure the area is cleaned thoroughly. Let  dry.  Apply patch as indicated in monitor instructions. Patch will be placed under collarbone on left side of  chest with arrow pointing upward.  Rub patch adhesive wings for 2 minutes. Remove the white label marked "1". Remove the white label  marked "2". Rub patch adhesive wings for 2 additional minutes.  While looking in a mirror, press and release button in center of patch. A small green light will flash 3-4  times. This will be your only indicator that the monitor has been turned on.  Do not shower for the first 24 hours. You may shower after the first 24 hours.  Press the button if you feel a symptom. You will hear a small click. Record Date, Time and Symptom in  the Patient Log.   Starting the Gateway  In your kit there is a Hydrographic surveyor box the size of a cellphone. This is Airline pilot. It transmits all your  recorded data to [facility]. This box must always stay within 10 feet of you. Open the box and push the *  button. There will be a light that blinks orange and then green a few times. When the light stops  blinking, the Gateway is connected to the ZIO patch. Call [employer] at [phone] to confirm your monitor is transmitting.  Returning your monitor  Remove your patch and place it inside the [place]. In the lower half of the Gateway there is a white  bag with prepaid postage on it. Place Gateway in bag and seal. Mail package back to [place] as soon as  possible. Your physician should have your final report approximately 7 days after you have mailed back  your monitor. Call [place] at [phone] if you have questions regarding your ZIO AT  patch monitor. Call them immediately if you see an orange light blinking on your monitor.  If your monitor falls off in less than 4 days, contact our Monitor department at  [phone]. If your  monitor becomes loose or falls off after 4 days call [employer] at [phone] for suggestions on  securing your monitor  Important Information About [place]

## 2022-03-27 ENCOUNTER — Encounter: Payer: Self-pay | Admitting: Cardiovascular Disease

## 2022-03-27 ENCOUNTER — Ambulatory Visit: Payer: Medicare Other | Attending: Cardiovascular Disease | Admitting: Cardiovascular Disease

## 2022-03-27 VITALS — BP 142/68 | HR 46 | Ht 59.0 in | Wt 124.4 lb

## 2022-03-27 DIAGNOSIS — E782 Mixed hyperlipidemia: Secondary | ICD-10-CM | POA: Insufficient documentation

## 2022-03-27 DIAGNOSIS — I10 Essential (primary) hypertension: Secondary | ICD-10-CM | POA: Diagnosis not present

## 2022-03-27 DIAGNOSIS — I2510 Atherosclerotic heart disease of native coronary artery without angina pectoris: Secondary | ICD-10-CM | POA: Insufficient documentation

## 2022-03-27 DIAGNOSIS — I739 Peripheral vascular disease, unspecified: Secondary | ICD-10-CM | POA: Diagnosis not present

## 2022-03-27 DIAGNOSIS — I6523 Occlusion and stenosis of bilateral carotid arteries: Secondary | ICD-10-CM | POA: Diagnosis not present

## 2022-03-27 NOTE — Progress Notes (Signed)
Cardiology Office Note   Date:  03/27/2022   ID:  Brittany Kirk, DOB July 31, 1945, MRN [id number]  PCP:  Susy Frizzle, MD  Cardiologist:  Dr. Gwenlyn Found  Chief Complaint  Patient presents with   Follow-up      History of Present Illness: Brittany Kirk is a 76 y.o. female who is here today for follow-up visit regarding peripheral arterial disease.   She has known history of coronary artery disease managed medically, previous tobacco use quit in 2012, atrial fibrillation on long-term anticoagulation and amiodarone therapy, essential hypertension, hyperlipidemia and peripheral arterial disease. She was seen in June for dependent cyanosis and weakness in both legs.  She reports bilateral feet and calf discomfort with exertion.  In addition, both legs feel weak.  She has bilateral leg swelling that is worse at the end of the day and has purpleish discoloration of both feet especially in the dependent position. She underwent lower extremity Doppler studies which showed an ABI of 0.78 on the right and 0.75 on the left.  Duplex showed significantly elevated velocity in the mid abdominal aorta greater than 500 with elevated velocities in the proximal common iliac arteries. She subsequently underwent angiography which showed moderate stenosis in the abdominal aorta with minimal disease involving the common iliac arteries and no infrainguinal disease.  There was a 30 mm systolic gradient with pullback from the aorta into the iliac artery.  CTA of the abdominal aorta was performed the images were personally reviewed by me.  The abdominal aorta below the renal arteries is significantly narrowed with a diameter of 6 mm at its narrowest point with significant circumferential and deep calcifications distally at the origin of the inferior mesenteric artery.   Past Medical History:  Diagnosis Date   Acid reflux    Allergic rhinitis    Anxiety    Asymptomatic bilateral carotid artery stenosis     40-59% (02/2018)   Atrial fibrillation ([place]) 07/2019   Breast cancer ([place]) 2015   Right Breast Cancer   CAD (coronary artery disease)    Depression    Emphysema    no longer uses inhalers   Gallstones    nausea, pain upper right abdomen   GERD (gastroesophageal reflux disease)    H/O hiatal hernia    Hiatal hernia    History of skin cancer    Hyperlipidemia    Hypertension    Multinodular goiter (nontoxic)    Scoliosis    Skin cancer    Tobacco user    Wears dentures    top    Past Surgical History:  Procedure Laterality Date   ABDOMINAL AORTOGRAM W/LOWER EXTREMITY N/A 02/05/2022   Procedure: ABDOMINAL AORTOGRAM W/LOWER EXTREMITY;  Surgeon: Lorretta Harp, MD;  Location: [place] [facility];  Service: Cardiovascular;  Laterality: N/A;   ATRIAL FIBRILLATION ABLATION N/A 04/14/2021   Procedure: ATRIAL FIBRILLATION ABLATION;  Surgeon: Constance Haw, MD;  Location: [place] [facility];  Service: Cardiovascular;  Laterality: N/A;   BREAST LUMPECTOMY Right 2015   CHOLECYSTECTOMY  07/08/2012   Procedure: LAPAROSCOPIC CHOLECYSTECTOMY WITH INTRAOPERATIVE CHOLANGIOGRAM;  Surgeon: Gayland Curry, MD,FACS;  Location: [facility];  Service: General;  Laterality: N/A;  Laparoscopic Cholecystectomy with Intraoperative Cholangiogram   COLONOSCOPY     ESOPHAGOSCOPY N/A 05/29/2017   Procedure: ESOPHAGOSCOPY;  Surgeon: Clarene Essex, MD;  Location: [facility];  Service: Endoscopy;  Laterality: N/A;  botox injection   HAND SURGERY Left    wrist   LEFT  HEART CATH AND CORONARY ANGIOGRAPHY N/A 10/11/2017   Procedure: LEFT HEART CATH AND CORONARY ANGIOGRAPHY;  Surgeon: Martinique, Peter M, MD;  Location: [place] [facility];  Service: Cardiovascular;  Laterality: N/A;   TONSILECTOMY, ADENOIDECTOMY, BILATERAL MYRINGOTOMY AND TUBES     TUBAL LIGATION       Current Outpatient Medications  Medication Sig Dispense Refill   acetaminophen (TYLENOL) 500 MG tablet Take 1,000 mg by mouth every 6 (six) hours as  needed for moderate pain or headache.     amiodarone (PACERONE) 100 MG tablet Take 1 tablet (100 mg total) by mouth every other day. 45 tablet 3   apixaban (ELIQUIS) 5 MG TABS tablet Take 1 tablet (5 mg total) by mouth 2 (two) times daily. 60 tablet 6   ascorbic acid (VITAMIN C) 1000 MG tablet Take 1,000 mg by mouth daily.     atorvastatin (LIPITOR) 40 MG tablet TAKE 1 TABLET BY MOUTH EVERYDAY AT BEDTIME 90 tablet 0   Cholecalciferol (VITAMIN D) 125 MCG (5000 UT) CAPS Take 5,000 Units by mouth daily.     Cyanocobalamin (B-12) 1000 MCG TBCR Take 1,000 mcg by mouth daily.     diazepam (VALIUM) 5 MG tablet Take 5 mg by mouth as needed for sedation.     furosemide (LASIX) 20 MG tablet TAKE 1 TABLET BY MOUTH EVERY DAY AS NEEDED 30 tablet 6   Magnesium 100 MG TABS Take 100 mg by mouth daily.     Omeprazole 20 MG TBEC Take 20 mg by mouth every evening.     valsartan (DIOVAN) 40 MG tablet Take 1 tablet (40 mg total) by mouth daily. Hold for Blood Pressure <120 90 tablet 3   Zinc 30 MG CAPS Take 30 mg by mouth daily.     No current facility-administered medications for this visit.    Allergies:   Penicillins, Fosamax [alendronate], Levofloxacin, Mexiletine, Cefaclor, Latex, Metronidazole, Naproxen, Nsaids, Septra [bactrim], Sulfonamide derivatives, and Tape    Social History:  The patient  reports that she quit smoking about 11 years ago. Her smoking use included cigarettes. She has a 23.00 pack-year smoking history. She has never used smokeless tobacco. She reports that she does not drink alcohol and does not use drugs.   Family History:  The patient's family history includes Aplastic anemia in an other family member; Arthritis in her mother; Diverticulitis in her sister; Heart disease in her sister and another family member; Pancreatic cancer in her mother.    ROS:  Please see the history of present illness.   Otherwise, review of systems are positive for none.   All other systems are reviewed and  negative.    PHYSICAL EXAM: VS:  BP (!) 142/68 (BP Location: Left Arm, Patient Position: Sitting, Cuff Size: Normal)   Pulse (!) 46   Ht '4\' 11"'$  (1.499 m)   Wt 124 lb 6.4 oz (56.4 kg)   SpO2 96%   BMI 25.13 kg/m  , BMI Body mass index is 25.13 kg/m. GEN: Well nourished, well developed, in no acute distress  HEENT: normal  Neck: no JVD, carotid bruits, or masses Cardiac: RRR; no rubs, or gallops, 2 out of 6 systolic murmur in the aortic area.  Mild to moderate bilateral leg edema with significant cyanosis. Respiratory:  clear to auscultation bilaterally, normal work of breathing GI: soft, nontender, nondistended, + BS MS: no deformity or atrophy  Skin: warm and dry, no rash Neuro:  Strength and sensation are intact Psych: euthymic mood, full affect  Vascular: Femoral pulses +1 bilaterally with abdominal bruit.  Dorsalis pedis is +1 bilaterally.  Posterior tibial is not palpable.   EKG:  EKG is not ordered today.    Recent Labs: 06/17/2021: B Natriuretic Peptide 372.1 12/21/2021: TSH 0.652 01/30/2022: Hemoglobin 11.7; Platelets 253 02/28/2022: ALT 13 03/06/2022: BUN 24; Creatinine, Ser 1.04; Potassium 4.4; Sodium 141    Lipid Panel    Component Value Date/Time   CHOL 152 02/28/2022 1108   TRIG 49 02/28/2022 1108   HDL 71 02/28/2022 1108   CHOLHDL 2.1 02/28/2022 1108   CHOLHDL 2.3 07/15/2019 0934   VLDL 10 07/15/2019 0934   LDLCALC 70 02/28/2022 1108   LDLCALC 61 07/14/2018 1149      Wt Readings from Last 3 Encounters:  03/27/22 124 lb 6.4 oz (56.4 kg)  03/26/22 124 lb 3.2 oz (56.3 kg)  03/06/22 128 lb 9.6 oz (58.3 kg)          11/21/2021    9:14 AM  PAD Screen  Previous PAD dx? No  Previous surgical procedure? No  Pain with walking? No  Feet/toe relief with dangling? No  Painful, non-healing ulcers? No  Extremities discolored? Yes      ASSESSMENT AND PLAN:  1.  Peripheral arterial disease: The patient's claudication is due to significant stenosis and  abdominal aorta below the renal arteries.  I reviewed the angiogram images with her as well as the CT scan images.  The disease there is not critical.  In addition, the area is heavily calcified and it is at the origin of the inferior mesenteric artery.  Considering the degree and thickness of calcifications, endovascular intervention is associated with increased risk of dissection and perforation even with a covered stent.  I discussed risks and benefits with her and I do not feel that her claudication is significant enough to justify the risk.  As such, I advised her to continue with a walking exercise program and reserve revascularization for worsening symptoms.  She has already started exercising on a stationary bike and is able to do that for at least 15 minutes.   2.  Coronary artery disease involving native coronary arteries without angina: Continue medical therapy.  3.  Essential hypertension: Blood pressure is mildly elevated today.  Continue to monitor and consider increasing valsartan if needed.  4.  Hyperlipidemia: Continue atorvastatin with a target LDL of less than 70  5.  Moderate bilateral carotid disease: Stable on most recent Doppler.  6.  Paroxysmal atrial fibrillation: Currently in sinus rhythm.  She is on amiodarone and Eliquis.  7.  Chronic venous insufficiency: I suspect that her lower extremity swelling and dependent cyanosis is likely due to this.  I explained to her that even with revascularization of the abdominal aortic stenosis, I do not expect this to improve.  Recommend continuing knee-high support stocking during the day and leg elevation.    Disposition:   FU with Dr. Gwenlyn Found in 6 months.  Signed,  Kathlyn Sacramento, MD  03/27/2022 10:24 AM    [place]

## 2022-03-27 NOTE — Patient Instructions (Signed)
Medication Instructions:  No changes *If you need a refill on your cardiac medications before your next appointment, please call your pharmacy*   Lab Work: None ordered If you have labs (blood work) drawn today and your tests are completely normal, you will receive your results only by: [place] (if you have MyChart) OR A paper copy in the mail If you have any lab test that is abnormal or we need to change your treatment, we will call you to review the results.   Testing/Procedures: None ordered   Follow-Up: At [facility], you and your health needs are our priority.  As part of our continuing mission to provide you with exceptional heart care, we have created designated Provider Care Teams.  These Care Teams include your primary Cardiologist (physician) and Advanced Practice Providers (APPs -  Physician Assistants and Nurse Practitioners) who all work together to provide you with the care you need, when you need it.  We recommend signing up for the patient portal called "MyChart".  Sign up information is provided on this After Visit Summary.  MyChart is used to connect with patients for Virtual Visits (Telemedicine).  Patients are able to view lab/test results, encounter notes, upcoming appointments, etc.  Non-urgent messages can be sent to your provider as well.   To learn more about what you can do with MyChart, go to [email or web address].    Your next appointment:   6 month(s)  The format for your next appointment:   In Person  Provider:   Dr. Gwenlyn Found Other Instructions EXERCISE PROGRAM FOR INDIVIDUALS WITH  PERIPHERAL ARTERIAL DISEASE (PAD)   General Information:   Research in vascular exercise has demonstrated remarkable improvement in symptoms of leg pain (claudication) without expensive or invasive interventions. Regular walking programs are extremely helpful for patients with PAD and intermittent claudication.  These steps are designed to help you get  started with a safe and effective program to help you walk farther with less pain:   Walk at least three times a week (preferably every day).  Your goal is to build up to 30-45 minutes of total walking time (not counting rest breaks). It may take you several weeks to build up your exercise time starting at 5-10 minutes or whatever you can tolerate.  Walk as far as possible using moderate to maximal pain (7-8 on the scale below) as a signal to stop, and resume walking when the pain goes away.  On a treadmill, set the speed and grade at a level that brings on the claudication pain within 3 to 5 minutes. Walk at this rate until you experience claudication of moderate severity, rest until the pain improves, and then resume walking.  Over time, you will be able to walk longer at the designated speed and grade; workload should then be increased until you develop the pain within 3 to 5 minutes once again.  This regimen will induce a significant benefit. Studies have demonstrated that participants may be able to walk up to three or four times farther and have less leg pain, within twelve weeks, by following this protocol.  Pain Scale    0_____1_____2_____3_____4_____5_____6_____7_____8_____9_____10   No Pain                                   Moderate Pain  Maximal Pain

## 2022-03-29 ENCOUNTER — Telehealth: Payer: Self-pay | Admitting: Cardiology

## 2022-03-29 DIAGNOSIS — R42 Dizziness and giddiness: Secondary | ICD-10-CM

## 2022-03-29 NOTE — Telephone Encounter (Signed)
[place]-AF trial medication added to medication list.  Last dose of Eliquis taken on 26Jun2023.

## 2022-03-30 DIAGNOSIS — R42 Dizziness and giddiness: Secondary | ICD-10-CM | POA: Diagnosis not present

## 2022-04-20 ENCOUNTER — Ambulatory Visit (INDEPENDENT_AMBULATORY_CARE_PROVIDER_SITE_OTHER): Payer: Medicare Other | Admitting: Family Medicine

## 2022-04-20 ENCOUNTER — Ambulatory Visit
Admission: RE | Admit: 2022-04-20 | Discharge: 2022-04-20 | Disposition: A | Discharge status: Home / Self Care | Payer: Medicare Other | Source: Ambulatory Visit | Attending: Family Medicine | Admitting: Family Medicine

## 2022-04-20 VITALS — BP 140/72 | HR 52 | Temp 97.8°F | Ht 59.0 in | Wt 124.0 lb

## 2022-04-20 DIAGNOSIS — R5383 Other fatigue: Secondary | ICD-10-CM

## 2022-04-20 DIAGNOSIS — M40204 Unspecified kyphosis, thoracic region: Secondary | ICD-10-CM | POA: Diagnosis not present

## 2022-04-20 DIAGNOSIS — M545 Low back pain, unspecified: Secondary | ICD-10-CM

## 2022-04-20 DIAGNOSIS — M542 Cervicalgia: Secondary | ICD-10-CM

## 2022-04-20 DIAGNOSIS — M549 Dorsalgia, unspecified: Secondary | ICD-10-CM | POA: Diagnosis not present

## 2022-04-20 DIAGNOSIS — M2578 Osteophyte, vertebrae: Secondary | ICD-10-CM | POA: Diagnosis not present

## 2022-04-20 DIAGNOSIS — I6523 Occlusion and stenosis of bilateral carotid arteries: Secondary | ICD-10-CM | POA: Diagnosis not present

## 2022-04-20 NOTE — Progress Notes (Signed)
Subjective:    Patient ID: Brittany Kirk, female    DOB: 04-16-1946, 76 y.o.   MRN: [id number]  Patient has a history of atrial fibrillation currently rhythm controlled with amiodarone and anticoagulated with Eliquis, heart disease, breast cancer.  She also has a history of vertebral fractures in the remote past.  She is not currently on any treatment for osteoporosis.  She has a history of several falls.  Over the last few months she has developed sharp pain up and down her back.  Specifically on exam today she has point tenderness to palpation in her lower cervical spine specifically over C7, and her upper thoracic spine especially over T3 as well as in her lower lumbar spine around L5.  The spinous processes of all these vertebrae are tender to the touch.  She denies any numbness or tingling or weakness in her extremities but she does report severe fatigue and lack of energy.  She is bradycardic on exam with her cardiologist is given her heart monitor to wear to evaluate the severity of the bradycardia.  She has not received the results of this yet.  She denies any chest pain or shortness of breath but specifically severe fatigue.  She denies any night sweats or fevers or symptoms that would make 1 suspicious for bone cancer.  Patient has marked kyphosis in the cervical spine Past Medical History:  Diagnosis Date   Acid reflux    Allergic rhinitis    Anxiety    Asymptomatic bilateral carotid artery stenosis    40-59% (02/2018)   Atrial fibrillation ([place]) 07/2019   Breast cancer ([place]) 2015   Right Breast Cancer   CAD (coronary artery disease)    Depression    Emphysema    no longer uses inhalers   Gallstones    nausea, pain upper right abdomen   GERD (gastroesophageal reflux disease)    H/O hiatal hernia    Hiatal hernia    History of skin cancer    Hyperlipidemia    Hypertension    Multinodular goiter (nontoxic)    Scoliosis    Skin cancer    Tobacco user    Wears dentures     top   Past Surgical History:  Procedure Laterality Date   ABDOMINAL AORTOGRAM W/LOWER EXTREMITY N/A 02/05/2022   Procedure: ABDOMINAL AORTOGRAM W/LOWER EXTREMITY;  Surgeon: Lorretta Harp, MD;  Location: [facility];  Service: Cardiovascular;  Laterality: N/A;   ATRIAL FIBRILLATION ABLATION N/A 04/14/2021   Procedure: ATRIAL FIBRILLATION ABLATION;  Surgeon: Constance Haw, MD;  Location: [facility];  Service: Cardiovascular;  Laterality: N/A;   BREAST LUMPECTOMY Right 2015   CHOLECYSTECTOMY  07/08/2012   Procedure: LAPAROSCOPIC CHOLECYSTECTOMY WITH INTRAOPERATIVE CHOLANGIOGRAM;  Surgeon: Gayland Curry, MD,FACS;  Location: [facility];  Service: General;  Laterality: N/A;  Laparoscopic Cholecystectomy with Intraoperative Cholangiogram   COLONOSCOPY     ESOPHAGOSCOPY N/A 05/29/2017   Procedure: ESOPHAGOSCOPY;  Surgeon: Clarene Essex, MD;  Location: [facility];  Service: Endoscopy;  Laterality: N/A;  botox injection   HAND SURGERY Left    wrist   LEFT HEART CATH AND CORONARY ANGIOGRAPHY N/A 10/11/2017   Procedure: LEFT HEART CATH AND CORONARY ANGIOGRAPHY;  Surgeon: Martinique, Peter M, MD;  Location: [facility];  Service: Cardiovascular;  Laterality: N/A;   TONSILECTOMY, ADENOIDECTOMY, BILATERAL MYRINGOTOMY AND TUBES     TUBAL LIGATION     Current Outpatient Medications on File Prior to Visit  Medication Sig  Dispense Refill   acetaminophen (TYLENOL) 500 MG tablet Take 1,000 mg by mouth every 6 (six) hours as needed for moderate pain or headache.     amiodarone (PACERONE) 100 MG tablet Take 1 tablet (100 mg total) by mouth every other day. 45 tablet 3   apixaban (ELIQUIS) 5 MG TABS tablet Take 1 tablet (5 mg total) by mouth 2 (two) times daily. 60 tablet 6   ascorbic acid (VITAMIN C) 1000 MG tablet Take 1,000 mg by mouth daily.     atorvastatin (LIPITOR) 40 MG tablet TAKE 1 TABLET BY MOUTH EVERYDAY AT BEDTIME 90 tablet 0   Cholecalciferol (VITAMIN D) 125 MCG (5000 UT) CAPS  Take 5,000 Units by mouth daily.     Cyanocobalamin (B-12) 1000 MCG TBCR Take 1,000 mcg by mouth daily.     diazepam (VALIUM) 5 MG tablet Take 5 mg by mouth as needed for sedation.     furosemide (LASIX) 20 MG tablet TAKE 1 TABLET BY MOUTH EVERY DAY AS NEEDED 30 tablet 6   Investigational - Study Medication Take 1-2 tablets by mouth in the morning and at bedtime. Study name: OCEANIC-AF (Asundexian - factor XIa inhibitor PO QD vs Apixaban PO BID in patients with A. Fib for stroke prevention). Additional study details: Treat study drug like Eliquis.     Magnesium 100 MG TABS Take 100 mg by mouth daily.     Omeprazole 20 MG TBEC Take 20 mg by mouth every evening.     valsartan (DIOVAN) 40 MG tablet Take 1 tablet (40 mg total) by mouth daily. Hold for Blood Pressure <120 90 tablet 3   Zinc 30 MG CAPS Take 30 mg by mouth daily. (Patient not taking: Reported on 04/20/2022)     No current facility-administered medications on file prior to visit.   Allergies  Allergen Reactions   Penicillins Anaphylaxis, Itching, Swelling and Rash    Has patient had a PCN reaction causing immediate rash, facial/tongue/throat swelling, SOB or lightheadedness with hypotension: Yes Has patient had a PCN reaction causing severe rash involving mucus membranes or skin necrosis: No Has patient had a PCN reaction that required hospitalization: Yes Has patient had a PCN reaction occurring within the last 10 years: No If all of the above answers are "NO", then may proceed with Cephalosporin use.    Fosamax [Alendronate]     myalgias   Levofloxacin Nausea Only and Other (See Comments)    Causes headache, fatigue and dizziness   Mexiletine Itching, Nausea Only and Other (See Comments)   Cefaclor Itching, Swelling and Rash   Latex Itching and Rash   Metronidazole Itching, Swelling and Rash   Naproxen Itching, Swelling and Rash   Nsaids Itching, Swelling and Rash    Ibuprofen IS tolerated   Septra [Bactrim] Itching,  Swelling and Rash   Sulfonamide Derivatives Itching, Swelling and Rash   Tape Itching and Rash    ONLY USE PAPER TAPE   Social History   Socioeconomic History   Marital status: Married    Spouse name: Psychologist, prison and probation services   Number of children: 1   Years of education: Not on file   Highest education level: Not on file  Occupational History   Occupation: retired truck Orthoptist: RETIRED  Tobacco Use   Smoking status: Former    Packs/day: 0.50    Years: 46.00    Total pack years: 23.00    Types: Cigarettes    Quit date: 02/13/2011    Years  since quitting: 11.1   Smokeless tobacco: Never  Vaping Use   Vaping Use: Never used  Substance and Sexual Activity   Alcohol use: No   Drug use: No   Sexual activity: Not on file  Other Topics Concern   Not on file  Social History Narrative   Married since 1991, second marriage.    1 son   1 grandson   1 granddaughter   2 step children   3 step grandchildren   Social Determinants of Health   Financial Resource Strain: Low Risk  (05/11/2021)   Overall Financial Resource Strain (CARDIA)    Difficulty of Paying Living Expenses: Not hard at all  Food Insecurity: No Food Insecurity (05/11/2021)   Hunger Vital Sign    Worried About Running Out of Food in the Last Year: Never true    Ran Out of Food in the Last Year: Never true  Transportation Needs: No Transportation Needs (05/11/2021)   PRAPARE - Hydrologist (Medical): No    Lack of Transportation (Non-Medical): No  Physical Activity: Insufficiently Active (05/11/2021)   Exercise Vital Sign    Days of Exercise per Week: 3 days    Minutes of Exercise per Session: 20 min  Stress: Stress Concern Present (05/11/2021)   [place]    Feeling of Stress : To some extent  Social Connections: Socially Integrated (05/11/2021)   Social Connection and Isolation Panel [NHANES]    Frequency of  Communication with Friends and Family: More than three times a week    Frequency of Social Gatherings with Friends and Family: More than three times a week    Attends Religious Services: More than 4 times per year    Active Member of [employer] or Organizations: Yes    Attends Archivist Meetings: More than 4 times per year    Marital Status: Married  Human resources officer Violence: Not At Risk (05/11/2021)   Humiliation, Afraid, Rape, and Kick questionnaire    Fear of Current or Ex-Partner: No    Emotionally Abused: No    Physically Abused: No    Sexually Abused: No     Review of Systems  All other systems reviewed and are negative.      Objective:   Physical Exam Vitals reviewed.  Constitutional:      General: She is not in acute distress.    Appearance: She is well-developed. She is not diaphoretic.  HENT:     Head: Normocephalic and atraumatic.  Neck:     Thyroid: No thyromegaly.     Vascular: No JVD.  Cardiovascular:     Rate and Rhythm: Normal rate. Rhythm irregular.     Heart sounds: Normal heart sounds. No murmur heard.    No friction rub. No gallop.  Pulmonary:     Effort: Pulmonary effort is normal. No respiratory distress.     Breath sounds: Normal breath sounds. No stridor. No wheezing or rales.  Abdominal:     General: Bowel sounds are normal. There is no distension.     Palpations: Abdomen is soft.     Tenderness: There is no abdominal tenderness. There is no guarding.  Musculoskeletal:     Cervical back: Deformity and bony tenderness present. Pain with movement present. Decreased range of motion.     Thoracic back: Tenderness and bony tenderness present. Decreased range of motion.     Lumbar back: Bony tenderness present. Decreased range of  motion.       Back:     Right lower leg: No edema.     Left lower leg: No edema.  Neurological:     Mental Status: She is alert and oriented to person, place, and time.     Cranial Nerves: No cranial nerve deficit.      Sensory: No sensory deficit.     Motor: Tremor present. No atrophy, abnormal muscle tone or seizure activity.     Deep Tendon Reflexes: Reflexes are normal and symmetric.           Assessment & Plan:  Low back pain at multiple sites - Plan: DG Lumbar Spine Complete, Protein electrophoresis, serum  Neck pain - Plan: DG Cervical Spine Complete, Protein electrophoresis, serum  Upper back pain - Plan: DG Thoracic Spine W/Swimmers, Protein electrophoresis, serum  Fatigue, unspecified type - Plan: CBC with Differential/Platelet, Protein electrophoresis, serum, Vitamin B12, TSH Given the bone pain at multiple sites and her fatigue, I do feel that we need to evaluate for any skeletal based neoplasm bone marrow cancer.  Therefore I will get an SPEP to evaluate for multiple myeloma, CBC to evaluate for leukemia.  I Minna check a lumbar spine x-ray, cervical spine x-ray, and a thoracic spine x-ray.  I suspect that there is likely going to be a vertebral fracture around T3.  Check CBC to evaluate for anemia.  Check a B12 to evaluate for B12 deficiency and a TSH to evaluate for thyroid abnormalities given her amiodarone use

## 2022-04-25 ENCOUNTER — Other Ambulatory Visit: Payer: Self-pay

## 2022-04-25 ENCOUNTER — Encounter: Payer: Self-pay | Admitting: Family Medicine

## 2022-04-25 DIAGNOSIS — M545 Low back pain, unspecified: Secondary | ICD-10-CM

## 2022-04-25 LAB — PROTEIN ELECTROPHORESIS, SERUM
Albumin ELP: 3.5 g/dL — ABNORMAL LOW (ref 3.8–4.8)
Alpha 1: 0.3 g/dL (ref 0.2–0.3)
Alpha 2: 0.6 g/dL (ref 0.5–0.9)
Beta 2: 0.3 g/dL (ref 0.2–0.5)
Beta Globulin: 0.4 g/dL (ref 0.4–0.6)
Gamma Globulin: 0.8 g/dL (ref 0.8–1.7)
Total Protein: 5.9 g/dL — ABNORMAL LOW (ref 6.1–8.1)

## 2022-04-25 LAB — CBC WITH DIFFERENTIAL/PLATELET
Absolute Monocytes: 481 cells/uL (ref 200–950)
Basophils Absolute: 22 cells/uL (ref 0–200)
Basophils Relative: 0.3 %
Eosinophils Absolute: 22 cells/uL (ref 15–500)
Eosinophils Relative: 0.3 %
HCT: 36.7 % (ref 35.0–45.0)
Hemoglobin: 12.3 g/dL (ref 11.7–15.5)
Lymphs Abs: 2316 cells/uL (ref 850–3900)
MCH: 34 pg — ABNORMAL HIGH (ref 27.0–33.0)
MCHC: 33.5 g/dL (ref 32.0–36.0)
MCV: 101.4 fL — ABNORMAL HIGH (ref 80.0–100.0)
MPV: 10.4 fL (ref 7.5–12.5)
Monocytes Relative: 6.5 %
Neutro Abs: 4558 cells/uL (ref 1500–7800)
Neutrophils Relative %: 61.6 %
Platelets: 253 10*3/uL (ref 140–400)
RBC: 3.62 10*6/uL — ABNORMAL LOW (ref 3.80–5.10)
RDW: 12.5 % (ref 11.0–15.0)
Total Lymphocyte: 31.3 %
WBC: 7.4 10*3/uL (ref 3.8–10.8)

## 2022-04-25 LAB — TSH: TSH: 0.5 mIU/L (ref 0.40–4.50)

## 2022-04-25 LAB — VITAMIN B12: Vitamin B-12: 1180 pg/mL — ABNORMAL HIGH (ref 200–1100)

## 2022-04-30 ENCOUNTER — Telehealth: Payer: Self-pay | Admitting: Pharmacist

## 2022-04-30 NOTE — Progress Notes (Signed)
Chronic Care Management Pharmacy Assistant   Name: NADIYAH ZEIS  MRN: [id number] DOB: November 20, 1945   Reason for Encounter: Disease State - Hypertension Call     Recent office visits:  04/20/22 [name], MD - Family Medicine - Low back pain - Labs were ordered. Imaging ordered. No medication changes. Follow up as scheduled.  Recent consult visits:  03/27/22 Kathlyn [place], MD - Cardiology - PAD - No medication changes. Follow up in 6 months.   03/26/22 Tommye Standard, PA-C - Cardiology - A fib - EKG ordered. Holter monitor applied. amiodarone (PACERONE) 200 MG tablet prescribed. Follow up in 1 month.   03/06/22 Kathlyn [place], MD - Cardiology - PAD - Labs were ordered. Follow up as scheduled.   02/28/22 [name], MD - Cardiology - CAD - Labs were ordered. Continue current medications. Follow up as scheduled.   01/23/22 Almyra Deforest, PA - Claudication in PAD - Cardiology - Labs were ordered.    Hospital visits: 02/05/22 Medication Reconciliation was completed by comparing discharge summary, patient's EMR and Pharmacy list, and upon discussion with patient.  Admitted to the hospital on 02/05/22 due to Abdominal Aortogram. Discharge date was 01/16/22. Discharged from [facility]?Medications Started at [facility] Discharge:?? None noted.   Medication Changes at Hospital Discharge: None noted  Medications Discontinued at Hospital Discharge: None noted  Medications that remain the same after Hospital Discharge:??  All other medications will remain the same.    Medications: Outpatient Encounter Medications as of 04/30/2022  Medication Sig   acetaminophen (TYLENOL) 500 MG tablet Take 1,000 mg by mouth every 6 (six) hours as needed for moderate pain or headache.   amiodarone (PACERONE) 100 MG tablet Take 1 tablet (100 mg total) by mouth every other day.   apixaban (ELIQUIS) 5 MG TABS tablet Take 1 tablet (5 mg total) by mouth 2 (two) times daily.   ascorbic acid  (VITAMIN C) 1000 MG tablet Take 1,000 mg by mouth daily.   atorvastatin (LIPITOR) 40 MG tablet TAKE 1 TABLET BY MOUTH EVERYDAY AT BEDTIME   Cholecalciferol (VITAMIN D) 125 MCG (5000 UT) CAPS Take 5,000 Units by mouth daily.   Cyanocobalamin (B-12) 1000 MCG TBCR Take 1,000 mcg by mouth daily.   diazepam (VALIUM) 5 MG tablet Take 5 mg by mouth as needed for sedation.   furosemide (LASIX) 20 MG tablet TAKE 1 TABLET BY MOUTH EVERY DAY AS NEEDED   Investigational - Study Medication Take 1-2 tablets by mouth in the morning and at bedtime. Study name: OCEANIC-AF (Asundexian - factor XIa inhibitor PO QD vs Apixaban PO BID in patients with A. Fib for stroke prevention). Additional study details: Treat study drug like Eliquis.   Magnesium 100 MG TABS Take 100 mg by mouth daily.   Omeprazole 20 MG TBEC Take 20 mg by mouth every evening.   valsartan (DIOVAN) 40 MG tablet Take 1 tablet (40 mg total) by mouth daily. Hold for Blood Pressure <120   Zinc 30 MG CAPS Take 30 mg by mouth daily. (Patient not taking: Reported on 04/20/2022)   No facility-administered encounter medications on file as of 04/30/2022.    Current antihypertensive regimen:  Furosemide (LASIX) 20 MG tablet Amiodarone (PACERONE) 200 MG tablet Valsartan 40 mg 1 tablet daily if BP >120  How often are you checking your Blood Pressure?  Patient reported checking blood pressures  Current home BP readings:     What recent interventions/DTPs have been made by any provider to  improve Blood Pressure control since last CPP Visit:  Patient denied any changes since last visit with CPP   Any recent hospitalizations or ED visits since last visit with CPP? Patient had a hospitalization on 02/05/22 for Abdominal Aortogram.   What diet changes have been made to improve Blood Pressure Control?  Patient reported limiting her salt intake.    What exercise is being done to improve your Blood Pressure Control?   Patient reported   Adherence  Review: Is the patient currently on ACE/ARB medication? Yes Does the patient have >5 day gap between last estimated fill dates? No    Care Gaps   AWV: 05/11/21 Colonoscopy: done 10/22/12 DM Eye Exam: N/A DM Foot Exam: N/A Microalbumin: N/A HbgAIC: done 07/15/19 (5.3) DEXA: ordered Mammogram: done 12/23/20     Star Rating Drugs: Atorvastatin (LIPITOR) 40 MG tablet - last filled 03/09/22  90 days  Valsartan (DIOVAN) 40 MG tablet - last filled 04/08/22 90 days   Future Appointments  Date Time Provider [place]  05/17/2022 11:15 AM Camnitz, Ocie Doyne, MD [facility]   Multiple attempts were made to contact patient. Attempts were unsuccessful. / ls,CMA   Jobe Gibbon, [place] Pharmacist Assistant  [phone]

## 2022-05-10 ENCOUNTER — Telehealth: Payer: Self-pay | Admitting: Family Medicine

## 2022-05-10 NOTE — Telephone Encounter (Signed)
Left message for patient to call back and schedule Medicare Annual Wellness Visit (AWV) in office.   If not able to come in office, please offer to do virtually or by telephone.   Last AWV: 05/11/2021   Please schedule at anytime with [facility]  If any questions, please contact me at [phone].  Thank you ,  Colletta [place]

## 2022-05-17 ENCOUNTER — Encounter: Payer: Self-pay | Admitting: Cardiology

## 2022-05-17 ENCOUNTER — Ambulatory Visit: Payer: Medicare Other | Attending: Cardiology | Admitting: Cardiology

## 2022-05-17 VITALS — BP 142/72 | HR 50 | Ht 59.0 in | Wt 123.0 lb

## 2022-05-17 DIAGNOSIS — I493 Ventricular premature depolarization: Secondary | ICD-10-CM | POA: Insufficient documentation

## 2022-05-17 DIAGNOSIS — Z79899 Other long term (current) drug therapy: Secondary | ICD-10-CM | POA: Insufficient documentation

## 2022-05-17 DIAGNOSIS — I480 Paroxysmal atrial fibrillation: Secondary | ICD-10-CM | POA: Insufficient documentation

## 2022-05-17 DIAGNOSIS — D6869 Other thrombophilia: Secondary | ICD-10-CM | POA: Diagnosis not present

## 2022-05-17 DIAGNOSIS — I6523 Occlusion and stenosis of bilateral carotid arteries: Secondary | ICD-10-CM

## 2022-05-17 LAB — CK: Total CK: 46 U/L (ref 32–182)

## 2022-05-17 LAB — COMPREHENSIVE METABOLIC PANEL
ALT: 11 IU/L (ref 0–32)
AST: 14 IU/L (ref 0–40)
Albumin/Globulin Ratio: 2 (ref 1.2–2.2)
Albumin: 3.9 g/dL (ref 3.8–4.8)
Alkaline Phosphatase: 57 IU/L (ref 44–121)
BUN/Creatinine Ratio: 21 (ref 12–28)
BUN: 27 mg/dL (ref 8–27)
Bilirubin Total: 0.4 mg/dL (ref 0.0–1.2)
CO2: 24 mmol/L (ref 20–29)
Calcium: 9.2 mg/dL (ref 8.7–10.3)
Chloride: 104 mmol/L (ref 96–106)
Creatinine, Ser: 1.31 mg/dL — ABNORMAL HIGH (ref 0.57–1.00)
Globulin, Total: 2 g/dL (ref 1.5–4.5)
Glucose: 100 mg/dL — ABNORMAL HIGH (ref 70–99)
Potassium: 4.9 mmol/L (ref 3.5–5.2)
Sodium: 141 mmol/L (ref 134–144)
Total Protein: 5.9 g/dL — ABNORMAL LOW (ref 6.0–8.5)
eGFR: 42 mL/min/{1.73_m2} — ABNORMAL LOW (ref >59)

## 2022-05-17 NOTE — Patient Instructions (Signed)
  Medication Instructions:  Your physician has recommended you make the following change in your medication:  STOP Amiodarone 1st of next year.  Please let us know once stopped and how you are doing off of the medication.  *If you need a refill on your cardiac medications before your next appointment, please call your pharmacy*   Lab Work: Today:  CMET & CK If you have labs (blood work) drawn today and your tests are completely normal, you will receive your results only by: [place] (if you have MyChart) OR A paper copy in the mail If you have any lab test that is abnormal or we need to change your treatment, we will call you to review the results.   Testing/Procedures: None ordered   Follow-Up: At [facility], you and your health needs are our priority.  As part of our continuing mission to provide you with exceptional heart care, we have created designated Provider Care Teams.  These Care Teams include your primary Cardiologist (physician) and Advanced Practice Providers (APPs -  Physician Assistants and Nurse Practitioners) who all work together to provide you with the care you need, when you need it.  Your next appointment:   6 month(s)  The format for your next appointment:   In Person  Provider:   Allegra Lai, MD    Thank you for choosing [facility]!!   Trinidad Curet, RN [phone]  Other Instructions   Important Information About Sugar

## 2022-05-17 NOTE — Progress Notes (Signed)
Electrophysiology Office Note   Date:  05/17/2022   ID:  Brittany Kirk, DOB [birth year], MRN [id number]  PCP:  Brittany Frizzle, MD  Cardiologist:  Brittany Kirk Primary Electrophysiologist:  Brittany Garde Meredith Leeds, MD    Chief Complaint: AF   History of Present Illness: Brittany Kirk is a 76 y.o. female who is being seen today for the evaluation of AF at the request of Brittany Frizzle, MD. Presenting today for electrophysiology evaluation.  She has a history significant for coronary artery disease, paroxysmal atrial fibrillation, hypertension, hyperlipidemia, carotid artery disease, breast cancer postlumpectomy in 2015.  Left heart catheterization 2019 showed single-vessel coronary disease in the ramus branch.  Medical therapy was recommended.  She is status post atrial fibrillation ablation 04/14/2021.  She has an elevated burden of PVCs and is now on amiodarone.  She did not tolerate mexiletine due to tremors.  Today, denies symptoms of palpitations, chest pain, shortness of breath, orthopnea, PND, lower extremity edema, claudication, dizziness, presyncope, syncope, bleeding, or neurologic sequela. The patient is tolerating medications without difficulties.  Complaint today is weakness and fatigue.  She states that she has trouble exerting herself at all.  She is quite tired.  She does not necessarily feel like she needs to fall asleep, but has quite a bit of muscle fatigue.  This started approximately 1 month ago.    Past Medical History:  Diagnosis Date   Acid reflux    Allergic rhinitis    Anxiety    Asymptomatic bilateral carotid artery stenosis    40-59% (02/2018)   Atrial fibrillation ([place]) 07/2019   Breast cancer ([place]) 2015   Right Breast Cancer   CAD (coronary artery disease)    Depression    Emphysema    no longer uses inhalers   Gallstones    nausea, pain upper right abdomen   GERD (gastroesophageal reflux disease)    H/O hiatal hernia    Hiatal hernia    History of  skin cancer    Hyperlipidemia    Hypertension    Multinodular goiter (nontoxic)    Scoliosis    Skin cancer    Tobacco user    Wears dentures    top   Past Surgical History:  Procedure Laterality Date   ABDOMINAL AORTOGRAM W/LOWER EXTREMITY N/A 02/05/2022   Procedure: ABDOMINAL AORTOGRAM W/LOWER EXTREMITY;  Surgeon: Lorretta Harp, MD;  Location: [facility];  Service: Cardiovascular;  Laterality: N/A;   ATRIAL FIBRILLATION ABLATION N/A 04/14/2021   Procedure: ATRIAL FIBRILLATION ABLATION;  Surgeon: Constance Haw, MD;  Location: [facility];  Service: Cardiovascular;  Laterality: N/A;   BREAST LUMPECTOMY Right 2015   CHOLECYSTECTOMY  07/08/2012   Procedure: LAPAROSCOPIC CHOLECYSTECTOMY WITH INTRAOPERATIVE CHOLANGIOGRAM;  Surgeon: Gayland Curry, MD,FACS;  Location: [facility];  Service: General;  Laterality: N/A;  Laparoscopic Cholecystectomy with Intraoperative Cholangiogram   COLONOSCOPY     ESOPHAGOSCOPY N/A 05/29/2017   Procedure: ESOPHAGOSCOPY;  Surgeon: Clarene Essex, MD;  Location: [facility];  Service: Endoscopy;  Laterality: N/A;  botox injection   HAND SURGERY Left    wrist   LEFT HEART CATH AND CORONARY ANGIOGRAPHY N/A 10/11/2017   Procedure: LEFT HEART CATH AND CORONARY ANGIOGRAPHY;  Surgeon: Martinique, Peter M, MD;  Location: [facility];  Service: Cardiovascular;  Laterality: N/A;   TONSILECTOMY, ADENOIDECTOMY, BILATERAL MYRINGOTOMY AND TUBES     TUBAL LIGATION       Current Outpatient Medications  Medication Sig Dispense Refill  acetaminophen (TYLENOL) 500 MG tablet Take 1,000 mg by mouth every 6 (six) hours as needed for moderate pain or headache.     amiodarone (PACERONE) 100 MG tablet Take 1 tablet (100 mg total) by mouth every other day. 45 tablet 3   apixaban (ELIQUIS) 5 MG TABS tablet Take 1 tablet (5 mg total) by mouth 2 (two) times daily. 60 tablet 6   ascorbic acid (VITAMIN C) 1000 MG tablet Take 1,000 mg by mouth daily.     atorvastatin  (LIPITOR) 40 MG tablet TAKE 1 TABLET BY MOUTH EVERYDAY AT BEDTIME 90 tablet 0   Cholecalciferol (VITAMIN D) 125 MCG (5000 UT) CAPS Take 5,000 Units by mouth daily.     diazepam (VALIUM) 5 MG tablet Take 5 mg by mouth as needed for sedation.     furosemide (LASIX) 20 MG tablet TAKE 1 TABLET BY MOUTH EVERY DAY AS NEEDED 30 tablet 6   Magnesium 100 MG TABS Take 100 mg by mouth daily.     Omeprazole 20 MG TBEC Take 20 mg by mouth every evening.     valsartan (DIOVAN) 40 MG tablet Take 1 tablet (40 mg total) by mouth daily. Hold for Blood Pressure <120 90 tablet 3   No current facility-administered medications for this visit.    Allergies:   Penicillins, Fosamax [alendronate], Levofloxacin, Mexiletine, Cefaclor, Latex, Metronidazole, Naproxen, Nsaids, Septra [bactrim], Sulfonamide derivatives, and Tape   Social History:  The patient  reports that she quit smoking about 11 years ago. Her smoking use included cigarettes. She has a 23.00 pack-year smoking history. She has never used smokeless tobacco. She reports that she does not drink alcohol and does not use drugs.   Family History:  The patient's family history includes Aplastic anemia in an other family member; Arthritis in her mother; Diverticulitis in her sister; Heart disease in her sister and another family member; Pancreatic cancer in her mother.   ROS:  Please see the history of present illness.   Otherwise, review of systems is positive for none.   All other systems are reviewed and negative.   PHYSICAL EXAM: VS:  BP (!) 142/72   Pulse (!) 50   Ht _0  (1.499 m)   Wt 123 lb (55.8 kg)   BMI 24.84 kg/m  , BMI Body mass index is 24.84 kg/m. GEN: Well nourished, well developed, in no acute distress  HEENT: normal  Neck: no JVD, carotid bruits, or masses Cardiac: RRR; no murmurs, rubs, or gallops,no edema  Respiratory:  clear to auscultation bilaterally, normal work of breathing GI: soft, nontender, nondistended, + BS MS: no  deformity or atrophy  Skin: warm and dry Neuro:  Strength and sensation are intact Psych: euthymic mood, full affect  EKG:  EKG is ordered today. Personal review of the ekg ordered shows sinus rhythm, rate 50  Recent Labs: 06/17/2021: B Natriuretic Peptide 372.1 02/28/2022: ALT 13 03/06/2022: BUN 24; Creatinine, Ser 1.04; Potassium 4.4; Sodium 141 04/20/2022: Hemoglobin 12.3; Platelets 253; TSH 0.50    Lipid Panel     Component Value Date/Time   CHOL 152 02/28/2022 1108   TRIG 49 02/28/2022 1108   HDL 71 02/28/2022 1108   CHOLHDL 2.1 02/28/2022 1108   CHOLHDL 2.3 07/15/2019 0934   VLDL 10 07/15/2019 0934   LDLCALC 70 02/28/2022 1108   LDLCALC 61 07/14/2018 1149     Wt Readings from Last 3 Encounters:  05/17/22 123 lb (55.8 kg)  04/20/22 124 lb (56.2 kg)  03/27/22 124  lb 6.4 oz (56.4 kg)      Other studies Reviewed: Additional studies/ records that were reviewed today include: Cardiac monitor 05/19/2021 personally reviewed Review of the above records today demonstrates:  1. SR/SB/ST 2. Freq PVCs often in a Bi/Tri geminal pattern  TTE 05/17/2021 personally reviewed  1. Left ventricular ejection fraction, by estimation, is 55 to 60%. The  left ventricle has normal function. The left ventricle has no regional  wall motion abnormalities. There is mild asymmetric left ventricular  hypertrophy of the basal-septal segment.  Left ventricular diastolic parameters were normal.   2. Right ventricular systolic function is normal. The right ventricular  size is normal. There is mildly elevated pulmonary artery systolic  pressure. The estimated right ventricular systolic pressure is 20.1 mmHg.   3. The mitral valve is normal in structure. Mild mitral valve  regurgitation.   4. The aortic valve is tricuspid. There is mild calcification of the  aortic valve. There is mild thickening of the aortic valve. Aortic valve  regurgitation is trivial.   5. The inferior vena cava is normal in  size with greater than 50%  respiratory variability, suggesting right atrial pressure of 3 mmHg.   ASSESSMENT AND PLAN:  1.  Paroxysmal atrial fibrillation: Currently on Eliquis 5 mg twice daily.  CHA2DS2-VASc 5.  Status post ablation 04/14/2021.  Remains in sinus rhythm.  2.  Coronary artery disease: Disease in the ramus branch.  Continue medical management per primary cardiology.  3.  Hypertension: Well-controlled.  Blood pressures average in the 007H systolic at home.  4.  PVCs: Currently on amiodarone 2 mg every other day.  She is having significant issues with weakness, fatigue.  I am concerned that this could be due to her amiodarone.  Kennet Mccort have her hold it for the next 3 weeks.  She is also concerned that her atorvastatin may be causing her to have muscle weakness.  Carl Butner order a comprehensive metabolic and CK to further evaluate.  5.  Second hypercoagulable state: Currently on Eliquis for atrial fibrillation as above   Current medicines are reviewed at length with the patient today.   The patient does not have concerns regarding her medicines.  The following changes were made today: Amiodarone  Labs/ tests ordered today include:  Orders Placed This Encounter  Procedures   CK (Creatine Kinase)   Comp Met (CMET)   EKG 12-Lead     Disposition:   FU 6 months  Signed, Jermon Chalfant Meredith Leeds, MD  05/17/2022 12:09 PM     [facility] [address] [place] [address] [phone] (office) [phone] (fax)

## 2022-05-29 ENCOUNTER — Encounter: Payer: Self-pay | Admitting: Cardiology

## 2022-05-29 ENCOUNTER — Encounter: Payer: Self-pay | Admitting: Cardiovascular Disease

## 2022-06-06 ENCOUNTER — Other Ambulatory Visit: Payer: Self-pay | Admitting: Family Medicine

## 2022-06-06 NOTE — Telephone Encounter (Signed)
Requested Prescriptions  Pending Prescriptions Disp Refills   atorvastatin (LIPITOR) 40 MG tablet [Pharmacy Med Name: ATORVASTATIN 40 MG TABLET] 90 tablet 0    Sig: TAKE 1 TABLET BY MOUTH EVERYDAY AT BEDTIME     Cardiovascular:  Antilipid - Statins Failed - 06/06/2022  2:05 AM      Failed - Lipid Panel in normal range within the last 12 months    Cholesterol, Total  Date Value Ref Range Status  02/28/2022 152 100 - 199 mg/dL Final   LDL Cholesterol (Calc)  Date Value Ref Range Status  07/14/2018 61 mg/dL (calc) Final    Comment:    Reference range: <100 . Desirable range <100 mg/dL for primary prevention;   <70 mg/dL for patients with CHD or diabetic patients  with > or = 2 CHD risk factors. Marland Kitchen LDL-C is now calculated using the Martin-Hopkins  calculation, which is a validated novel method providing  better accuracy than the Friedewald equation in the  estimation of LDL-C.  Cresenciano Genre et al. Annamaria Helling. 2130;865(78): 2061-2068  (http://education.[employer].com/faq/FAQ164)    LDL Chol Calc (NIH)  Date Value Ref Range Status  02/28/2022 70 0 - 99 mg/dL Final   HDL  Date Value Ref Range Status  02/28/2022 71 >39 mg/dL Final   Triglycerides  Date Value Ref Range Status  02/28/2022 49 0 - 149 mg/dL Final         Passed - Patient is not pregnant      Passed - Valid encounter within last 12 months    Recent Outpatient Visits           11 months ago Paroxysmal atrial fibrillation ([place])   [place], Cammie Mcgee, MD   12 months ago Viral upper respiratory tract infection   [place] Eulogio Bear, NP   1 year ago [place] [facility] [facility], Cammie Mcgee, MD   1 year ago Strain of lumbar region, initial encounter   [place] Pickard, Cammie Mcgee, MD   1 year ago Left hip pain   [place] Pickard, Cammie Mcgee, MD       Future Appointments             In 5 months  [place], Ocie Doyne, MD [facility]

## 2022-06-15 ENCOUNTER — Encounter: Payer: Self-pay | Admitting: Cardiovascular Disease

## 2022-06-17 DIAGNOSIS — Z23 Encounter for immunization: Secondary | ICD-10-CM | POA: Diagnosis not present

## 2022-06-22 ENCOUNTER — Encounter: Payer: Self-pay | Admitting: Family Medicine

## 2022-07-11 ENCOUNTER — Encounter: Payer: Self-pay | Admitting: Cardiovascular Disease

## 2022-07-12 ENCOUNTER — Ambulatory Visit (INDEPENDENT_AMBULATORY_CARE_PROVIDER_SITE_OTHER): Payer: Medicare Other

## 2022-07-12 VITALS — Ht 59.0 in | Wt 123.0 lb

## 2022-07-12 DIAGNOSIS — Z0000 Encounter for general adult medical examination without abnormal findings: Secondary | ICD-10-CM

## 2022-07-12 NOTE — Progress Notes (Signed)
Subjective:   Brittany Kirk is a 77 y.o. female who presents for Medicare Annual (Subsequent) preventive examination.  I connected with  [name] on 07/12/22 by a audio enabled telemedicine application and verified that I am speaking with the correct person using two identifiers.  Patient Location: Home  Provider Location: Office/Clinic  I discussed the limitations of evaluation and management by telemedicine. The patient expressed understanding and agreed to proceed.  Review of Systems     Cardiac Risk Factors include: advanced age (>44mn, >>23women);hypertension;dyslipidemia     Objective:    Today's Vitals   07/12/22 1513  Weight: 123 lb (55.8 kg)  Height: '4\' 11"'$  (1.499 m)   Body mass index is 24.84 kg/m.     07/12/2022    3:22 PM 02/05/2022   10:20 AM 06/17/2021   12:02 AM 05/11/2021    2:13 PM 04/14/2021    6:07 AM 03/21/2021   11:56 AM 01/13/2021    2:06 AM  Advanced Directives  Does Patient Have a Medical Advance Directive? Yes Yes No No No No No  Type of Advance Directive Living will Living will       Does patient want to make changes to medical advance directive? No - Patient declined        Would patient like information on creating a medical advance directive?    No - Patient declined No - Patient declined No - Patient declined     Current Medications (verified) Outpatient Encounter Medications as of 07/12/2022  Medication Sig   acetaminophen (TYLENOL) 500 MG tablet Take 1,000 mg by mouth every 6 (six) hours as needed for moderate pain or headache.   apixaban (ELIQUIS) 5 MG TABS tablet Take 1 tablet (5 mg total) by mouth 2 (two) times daily.   ascorbic acid (VITAMIN C) 1000 MG tablet Take 1,000 mg by mouth daily.   atorvastatin (LIPITOR) 40 MG tablet TAKE 1 TABLET BY MOUTH EVERYDAY AT BEDTIME   Cholecalciferol (VITAMIN D) 125 MCG (5000 UT) CAPS Take 5,000 Units by mouth daily.   diazepam (VALIUM) 5 MG tablet Take 5 mg by mouth as needed for sedation.    furosemide (LASIX) 20 MG tablet TAKE 1 TABLET BY MOUTH EVERY DAY AS NEEDED   Magnesium 100 MG TABS Take 100 mg by mouth daily.   Omeprazole 20 MG TBEC Take 20 mg by mouth every evening.   valsartan (DIOVAN) 40 MG tablet Take 1 tablet (40 mg total) by mouth daily. Hold for Blood Pressure <120   [DISCONTINUED] amiodarone (PACERONE) 100 MG tablet Take 1 tablet (100 mg total) by mouth every other day.   No facility-administered encounter medications on file as of 07/12/2022.    Allergies (verified) Penicillins, Fosamax [alendronate], Levofloxacin, Mexiletine, Cefaclor, Latex, Metronidazole, Naproxen, Nsaids, Septra [bactrim], Sulfonamide derivatives, and Tape   History: Past Medical History:  Diagnosis Date   Acid reflux    Allergic rhinitis    Anxiety    Asymptomatic bilateral carotid artery stenosis    40-59% (02/2018)   Atrial fibrillation ([facility] 07/2019   Breast cancer ([facility] 2015   Right Breast Cancer   CAD (coronary artery disease)    Depression    Emphysema    no longer uses inhalers   Gallstones    nausea, pain upper right abdomen   GERD (gastroesophageal reflux disease)    H/O hiatal hernia    Hiatal hernia    History of skin cancer    Hyperlipidemia    Hypertension  Multinodular goiter (nontoxic)    Scoliosis    Skin cancer    Tobacco user    Wears dentures    top   Past Surgical History:  Procedure Laterality Date   ABDOMINAL AORTOGRAM W/LOWER EXTREMITY N/A 02/05/2022   Procedure: ABDOMINAL AORTOGRAM W/LOWER EXTREMITY;  Surgeon: Lorretta Harp, MD;  Location: [facility];  Service: Cardiovascular;  Laterality: N/A;   ATRIAL FIBRILLATION ABLATION N/A 04/14/2021   Procedure: ATRIAL FIBRILLATION ABLATION;  Surgeon: Constance Haw, MD;  Location: [facility];  Service: Cardiovascular;  Laterality: N/A;   BREAST LUMPECTOMY Right 2015   CHOLECYSTECTOMY  07/08/2012   Procedure: LAPAROSCOPIC CHOLECYSTECTOMY WITH INTRAOPERATIVE CHOLANGIOGRAM;   Surgeon: Gayland Curry, MD,FACS;  Location: [facility];  Service: General;  Laterality: N/A;  Laparoscopic Cholecystectomy with Intraoperative Cholangiogram   COLONOSCOPY     ESOPHAGOSCOPY N/A 05/29/2017   Procedure: ESOPHAGOSCOPY;  Surgeon: Clarene Essex, MD;  Location: [facility];  Service: Endoscopy;  Laterality: N/A;  botox injection   HAND SURGERY Left    wrist   LEFT HEART CATH AND CORONARY ANGIOGRAPHY N/A 10/11/2017   Procedure: LEFT HEART CATH AND CORONARY ANGIOGRAPHY;  Surgeon: Martinique, Peter M, MD;  Location: [facility];  Service: Cardiovascular;  Laterality: N/A;   TONSILECTOMY, ADENOIDECTOMY, BILATERAL MYRINGOTOMY AND TUBES     TUBAL LIGATION     Family History  Problem Relation Age of Onset   Arthritis Mother    Pancreatic cancer Mother    Heart disease Other        5 brothers and 2 sister   Diverticulitis Sister    Heart disease Sister    Aplastic anemia Other    Social History   Socioeconomic History   Marital status: Married    Spouse name: Psychologist, prison and probation services   Number of children: 1   Years of education: Not on file   Highest education level: Not on file  Occupational History   Occupation: retired truck Orthoptist: RETIRED  Tobacco Use   Smoking status: Former    Packs/day: 0.50    Years: 46.00    Total pack years: 23.00    Types: Cigarettes    Quit date: 02/13/2011    Years since quitting: 11.4   Smokeless tobacco: Never  Vaping Use   Vaping Use: Never used  Substance and Sexual Activity   Alcohol use: No   Drug use: No   Sexual activity: Not on file  Other Topics Concern   Not on file  Social History Narrative   Married since 1991, second marriage.    1 son   1 grandson   1 granddaughter   2 step children   3 step grandchildren   Social Determinants of Health   Financial Resource Strain: Low Risk  (07/12/2022)   Overall Financial Resource Strain (CARDIA)    Difficulty of Paying Living Expenses: Not hard at all  Food Insecurity: No Food  Insecurity (07/12/2022)   Hunger Vital Sign    Worried About Running Out of Food in the Last Year: Never true    Ran Out of Food in the Last Year: Never true  Transportation Needs: No Transportation Needs (07/12/2022)   PRAPARE - Hydrologist (Medical): No    Lack of Transportation (Non-Medical): No  Physical Activity: Insufficiently Active (07/12/2022)   Exercise Vital Sign    Days of Exercise per Week: 3 days    Minutes of Exercise per Session:  20 min  Stress: No Stress Concern Present (07/12/2022)   [place]    Feeling of Stress : Only a little  Social Connections: Socially Integrated (07/12/2022)   Social Connection and Isolation Panel [NHANES]    Frequency of Communication with Friends and Family: Once a week    Frequency of Social Gatherings with Friends and Family: Twice a week    Attends Religious Services: More than 4 times per year    Active Member of [employer] or Organizations: Yes    Attends Music therapist: More than 4 times per year    Marital Status: Married    Tobacco Counseling Counseling given: Not Answered   Clinical Intake:  Pre-visit preparation completed: Yes  Pain : No/denies pain     Diabetes: No  How often do you need to have someone help you when you read instructions, pamphlets, or other written materials from your doctor or pharmacy?: 1 - Never  Diabetic?No   Interpreter Needed?: No  Information entered by :: Denman George LPN   Activities of Daily Living    07/12/2022    3:22 PM 07/08/2022    5:00 PM  In your present state of health, do you have any difficulty performing the following activities:  Hearing? 0 0  Vision? 0 0  Difficulty concentrating or making decisions? 0 0  Walking or climbing stairs? 0 0  Dressing or bathing? 0 0  Doing errands, shopping? 0 0  Preparing Food and eating ? N N  Using the Toilet? N N  In the past six  months, have you accidently leaked urine? N N  Do you have problems with loss of bowel control? N N  Managing your Medications? N N  Managing your Finances? N N  Housekeeping or managing your Housekeeping? N N    Patient Care Team: Susy Frizzle, MD as PCP - General (Family Medicine) Lorretta Harp, MD as PCP - Cardiology (Cardiology) Constance Haw, MD as PCP - Electrophysiology (Cardiology) Magrinat, Virgie Dad, MD (Inactive) as Consulting Physician (Oncology) Thea Silversmith, MD as Consulting Physician (Radiation Oncology) Dian Queen, MD as Consulting Physician (Obstetrics and Gynecology) Clarene Essex, MD as Consulting Physician (Gastroenterology) Edythe Clarity, [facility] as Pharmacist (Pharmacist) Delsa Sale, OD (Optometry)  Indicate any recent Medical Services you may have received from other than [facility] providers in the past year (date may be approximate).     Assessment:   This is a routine wellness examination for Anh.  Hearing/Vision screen Hearing Screening - Comments:: Denies hearing difficulties   Vision Screening - Comments:: Wears rx glasses - up to date with routine eye exams with [facility]    Dietary issues and exercise activities discussed: Current Exercise Habits: Home exercise routine, Time (Minutes): 20, Frequency (Times/Week): 3, Weekly Exercise (Minutes/Week): 60, Intensity: Mild   Goals Addressed             This Visit's Progress    COMPLETED: CAD       CARE PLAN ENTRY  Current Barriers:  Chronic Disease Management support, education, and care coordination needs related to atrial fibrilation.  Pharmacist Clinical Goal(s):  Over the next 180 days patient will work with PharmD to optimize medication regimen related to atrial fibrilation.  Interventions: Comprehensive medication review performed. Patient instructed to report any abnormal bruising/bleeding to provider.  Patient Self Care Activities:  Patient  verbalizes understanding of plan as described above and Calls provider office for new  concerns or questions Over the next 30 days patient will focus on medication adherence by using a pill box.  Initial goal documentation      COMPLETED: Dyslipidemia - LDL <70       CARE PLAN ENTRY (see longitudinal plan of care for additional care plan information)  Current Barriers:  Controlled hyperlipidemia, complicated by CAD, hypertension, and GERD. Current antihyperlipidemic regimen: atorvastatin '40mg'$  daily Previous antihyperlipidemic medications tried: none noted Most recent lipid panel:     Component Value Date/Time   CHOL 133 07/15/2019 0934   CHOL 124 02/27/2017 0940   TRIG 48 07/15/2019 0934   HDL 59 07/15/2019 0934   HDL 58 02/27/2017 0940   CHOLHDL 2.3 07/15/2019 0934   VLDL 10 07/15/2019 0934   LDLCALC 64 07/15/2019 0934   LDLCALC 61 07/14/2018 1149  ASCVD risk enhancing conditions: age >45, DM, HTN, CKD, CHF, current smoker 10-year ASCVD risk score: 18.1%  Pharmacist Clinical Goal(s):  Over the next 30 days, patient will work with PharmD and providers towards optimized antihyperlipidemic therapy  Interventions: Comprehensive medication review performed; medication list updated in electronic medical record.  Inter-disciplinary care team collaboration (see longitudinal plan of care) Continue current medications.  Patient Self Care Activities:  Patient will focus on medication adherence over the next 30 days using pill box.  Initial goal documentation      COMPLETED: Hypertension - BP < 130/80       CARE PLAN ENTRY (see longitudinal plan of care for additional care plan information)  Current Barriers:  Controlled hypertension, complicated by dyslipidemia, CAD, and GERD. Current antihypertensive regimen: none noted Previous antihypertensives tried: losartan, ramipril Last practice recorded BP readings:  BP Readings from Last 3 Encounters:  09/29/19 120/60  07/22/19  (!) 122/58  07/20/19 128/70  Current home BP readings: 88/35 to 131/58 with most being at normal range.  Pharmacist Clinical Goal(s):  Over the next 180 days, patient will work with PharmD and providers to optimize antihypertensive regimen  Interventions: Inter-disciplinary care team collaboration (see longitudinal plan of care) Comprehensive medication review performed; medication list updated in the electronic medical record.  Will continue to monitor BP daily as record in log.  Patient Self Care Activities:  Over the next 30 days, patient will continue to check BP at least twice daily , document, and provide at future appointments Patient will focus on medication adherence by using a pill box.  Please see past updates related to this goal by clicking on the "Past Updates" button in the selected goal        Depression Screen    07/12/2022    3:20 PM 04/20/2022   11:54 AM 04/20/2022   11:52 AM 05/11/2021    2:09 PM 08/26/2018    2:15 PM 07/14/2018   11:23 AM 01/06/2018    2:06 PM  PHQ 2/9 Scores  PHQ - 2 Score 0 0 0 0 '2 1 2  '$ PHQ- 9 Score  8 0  4  8    Fall Risk    07/12/2022    3:14 PM 07/08/2022    5:00 PM 04/20/2022   11:52 AM 05/11/2021    2:15 PM 08/04/2020    9:57 AM  Fall Risk   Falls in the past year? 0 0 0 0 1  Number falls in past yr: 0   0 0  Injury with Fall? 0   0   Risk for fall due to :    No Fall Risks   Follow up  Falls prevention discussed;Education provided;Falls evaluation completed   Falls prevention discussed     FALL RISK PREVENTION PERTAINING TO THE HOME:  Any stairs in or around the home? Yes  If so, are there any without handrails? No  Home free of loose throw rugs in walkways, pet beds, electrical cords, etc? Yes  Adequate lighting in your home to reduce risk of falls? Yes   ASSISTIVE DEVICES UTILIZED TO PREVENT FALLS:  Life alert? No  Use of a cane, walker or w/c? No  Grab bars in the bathroom? Yes  Shower chair or bench in shower? No   Elevated toilet seat or a handicapped toilet? Yes   TIMED UP AND GO:  Was the test performed? No . Telephonic visit   Cognitive Function:        07/12/2022    3:22 PM 05/11/2021    2:18 PM  6CIT Screen  What Year? 0 points 0 points  What month? 0 points 0 points  What time? 0 points 0 points  Count back from 20 0 points 0 points  Months in reverse 0 points 2 points  Repeat phrase 0 points 0 points  Total Score 0 points 2 points    Immunizations Immunization History  Administered Date(s) Administered   Fluad Quad(high Dose 65+) 03/10/2019   Influenza Split 05/04/2013   Influenza Whole 06/11/2006, 06/11/2008, 03/06/2011   Influenza, High Dose Seasonal PF 03/12/2017, 04/23/2018   Influenza,inj,Quad PF,6+ Mos 06/14/2014, 02/22/2015, 03/08/2016   Influenza-Unspecified 06/17/2022   PFIZER(Purple Top)SARS-COV-2 Vaccination 08/07/2019, 09/01/2019   Pneumococcal Conjugate-13 02/22/2015   Pneumococcal Polysaccharide-23 07/14/2013   RSV,unspecified 06/17/2022   Tdap 05/19/2012   Unspecified SARS-COV-2 Vaccination 08/21/2019    TDAP status: Due, Education has been provided regarding the importance of this vaccine. Advised may receive this vaccine at local pharmacy or Health Dept. Aware to provide a copy of the vaccination record if obtained from local pharmacy or Health Dept. Verbalized acceptance and understanding.  Flu Vaccine status: Up to date  Pneumococcal vaccine status: Up to date  Covid-19 vaccine status: Information provided on how to obtain vaccines.   Qualifies for Shingles Vaccine? Yes   Zostavax completed No   Shingrix Completed?: No.    Education has been provided regarding the importance of this vaccine. Patient has been advised to call insurance company to determine out of pocket expense if they have not yet received this vaccine. Advised may also receive vaccine at local pharmacy or Health Dept. Verbalized acceptance and understanding.  Screening Tests Health  Maintenance  Topic Date Due   Hepatitis C Screening  Never done   Zoster Vaccines- Shingrix (1 of 2) Never done   Lung Cancer Screening  08/14/2018   COVID-19 Vaccine (4 - 2023-24 season) 02/09/2022   DTaP/Tdap/Td (2 - Td or Tdap) 05/19/2022   Medicare Annual Wellness (AWV)  07/13/2023   Pneumonia Vaccine 35+ Years old  Completed   INFLUENZA VACCINE  Completed   DEXA SCAN  Completed   HPV VACCINES  Aged Out   COLONOSCOPY (Pts 45-31yr Insurance coverage will need to be confirmed)  Discontinued    Health Maintenance  Health Maintenance Due  Topic Date Due   Hepatitis C Screening  Never done   Zoster Vaccines- Shingrix (1 of 2) Never done   Lung Cancer Screening  08/14/2018   COVID-19 Vaccine (4 - 2023-24 season) 02/09/2022   DTaP/Tdap/Td (2 - Td or Tdap) 05/19/2022    Colorectal cancer screening: No longer required.   Mammogram status: No  longer required due to age .  Bone Density status: Completed 02/07/15. Results reflect: Bone density results: OSTEOPOROSIS. Repeat every 2 years.  Lung Cancer Screening: (Low Dose CT Chest recommended if Age 15-80 years, 30 pack-year currently smoking OR have quit w/in 15years.) does not qualify.   Lung Cancer Screening Referral: n/a   Additional Screening:  Hepatitis C Screening: does qualify; Completed at next visit   Vision Screening: Recommended annual ophthalmology exams for early detection of glaucoma and other disorders of the eye. Is the patient up to date with their annual eye exam?  Yes  Who is the provider or what is the name of the office in which the patient attends annual eye exams? Dr. [facility] If pt is not established with a provider, would they like to be referred to a provider to establish care? No .   Dental Screening: Recommended annual dental exams for proper oral hygiene  Community Resource Referral / Chronic Care Management: CRR required this visit?  No   CCM required this visit?  No      Plan:     I have  personally reviewed and noted the following in the patient's chart:   Medical and social history Use of alcohol, tobacco or illicit drugs  Current medications and supplements including opioid prescriptions. Patient is not currently taking opioid prescriptions. Functional ability and status Nutritional status Physical activity Advanced directives List of other physicians Hospitalizations, surgeries, and ER visits in previous 12 months Vitals Screenings to include cognitive, depression, and falls Referrals and appointments  In addition, I have reviewed and discussed with patient certain preventive protocols, quality metrics, and best practice recommendations. A written personalized care plan for preventive services as well as general preventive health recommendations were provided to patient.     Vanetta Mulders, [place]   02/16/9479   Due to this being a virtual visit, the after visit summary with patients personalized plan was offered to patient via mail or my-chart. Patient would like to access on my-chart  Nurse Notes: No concerns

## 2022-07-12 NOTE — Patient Instructions (Signed)
[name] , Thank you for taking time to come for your Medicare Wellness Visit. I appreciate your ongoing commitment to your health goals. Please review the following plan we discussed and let me know if I can assist you in the future.   These are the goals we discussed:  Goals      Exercise 3x per week (30 min per time)     Try to exercise more and get more rest.         This is a list of the screening recommended for you and due dates:  Health Maintenance  Topic Date Due   Hepatitis C Screening: USPSTF Recommendation to screen - Ages 24-79 yo.  Never done   Zoster (Shingles) Vaccine (1 of 2) Never done   Screening for Lung Cancer  08/14/2018   COVID-19 Vaccine (4 - 2023-24 season) 02/09/2022   DTaP/Tdap/Td vaccine (2 - Td or Tdap) 05/19/2022   Medicare Annual Wellness Visit  07/13/2023   Pneumonia Vaccine  Completed   Flu Shot  Completed   DEXA scan (bone density measurement)  Completed   HPV Vaccine  Aged Out   Colon Cancer Screening  Discontinued    Advanced directives: Please bring a copy of your health care power of attorney and living will to the office to be added to your chart at your convenience.   Conditions/risks identified: Aim for 30 minutes of exercise or brisk walking, 6-8 glasses of water, and 5 servings of fruits and vegetables each day.   Next appointment: Follow up in one year for your annual wellness visit    Preventive Care 65 Years and Older, Female Preventive care refers to lifestyle choices and visits with your health care provider that can promote health and wellness. What does preventive care include? A yearly physical exam. This is also called an annual well check. Dental exams once or twice a year. Routine eye exams. Ask your health care provider how often you should have your eyes checked. Personal lifestyle choices, including: Daily care of your teeth and gums. Regular physical activity. Eating a healthy diet. Avoiding tobacco and drug  use. Limiting alcohol use. Practicing safe sex. Taking low-dose aspirin every day. Taking vitamin and mineral supplements as recommended by your health care provider. What happens during an annual well check? The services and screenings done by your health care provider during your annual well check will depend on your age, overall health, lifestyle risk factors, and family history of disease. Counseling  Your health care provider may ask you questions about your: Alcohol use. Tobacco use. Drug use. Emotional well-being. Home and relationship well-being. Sexual activity. Eating habits. History of falls. Memory and ability to understand (cognition). Work and work Statistician. Reproductive health. Screening  You may have the following tests or measurements: Height, weight, and BMI. Blood pressure. Lipid and cholesterol levels. These may be checked every 5 years, or more frequently if you are over 32 years old. Skin check. Lung cancer screening. You may have this screening every year starting at age 87 if you have a 30-pack-year history of smoking and currently smoke or have quit within the past 15 years. Fecal occult blood test (FOBT) of the stool. You may have this test every year starting at age 4. Flexible sigmoidoscopy or colonoscopy. You may have a sigmoidoscopy every 5 years or a colonoscopy every 10 years starting at age 33. Hepatitis C blood test. Hepatitis B blood test. Sexually transmitted disease (STD) testing. Diabetes screening. This is done  by checking your blood sugar (glucose) after you have not eaten for a while (fasting). You may have this done every 1-3 years. Bone density scan. This is done to screen for osteoporosis. You may have this done starting at age 88. Mammogram. This may be done every 1-2 years. Talk to your health care provider about how often you should have regular mammograms. Talk with your health care provider about your test results, treatment  options, and if necessary, the need for more tests. Vaccines  Your health care provider may recommend certain vaccines, such as: Influenza vaccine. This is recommended every year. Tetanus, diphtheria, and acellular pertussis (Tdap, Td) vaccine. You may need a Td booster every 10 years. Zoster vaccine. You may need this after age 39. Pneumococcal 13-valent conjugate (PCV13) vaccine. One dose is recommended after age 35. Pneumococcal polysaccharide (PPSV23) vaccine. One dose is recommended after age 52. Talk to your health care provider about which screenings and vaccines you need and how often you need them. This information is not intended to replace advice given to you by your health care provider. Make sure you discuss any questions you have with your health care provider. Document Released: 06/24/2015 Document Revised: 02/15/2016 Document Reviewed: 03/29/2015 Elsevier Interactive Patient Education  2017 [employer] Prevention in the Home Falls can cause injuries. They can happen to people of all ages. There are many things you can do to make your home safe and to help prevent falls. What can I do on the outside of my home? Regularly fix the edges of walkways and driveways and fix any cracks. Remove anything that might make you trip as you walk through a door, such as a raised step or threshold. Trim any bushes or trees on the path to your home. Use bright outdoor lighting. Clear any walking paths of anything that might make someone trip, such as rocks or tools. Regularly check to see if handrails are loose or broken. Make sure that both sides of any steps have handrails. Any raised decks and porches should have guardrails on the edges. Have any leaves, snow, or ice cleared regularly. Use sand or salt on walking paths during winter. Clean up any spills in your garage right away. This includes oil or grease spills. What can I do in the bathroom? Use night lights. Install grab  bars by the toilet and in the tub and shower. Do not use towel bars as grab bars. Use non-skid mats or decals in the tub or shower. If you need to sit down in the shower, use a plastic, non-slip stool. Keep the floor dry. Clean up any water that spills on the floor as soon as it happens. Remove soap buildup in the tub or shower regularly. Attach bath mats securely with double-sided non-slip rug tape. Do not have throw rugs and other things on the floor that can make you trip. What can I do in the bedroom? Use night lights. Make sure that you have a light by your bed that is easy to reach. Do not use any sheets or blankets that are too big for your bed. They should not hang down onto the floor. Have a firm chair that has side arms. You can use this for support while you get dressed. Do not have throw rugs and other things on the floor that can make you trip. What can I do in the kitchen? Clean up any spills right away. Avoid walking on wet floors. Keep items that you use  a lot in easy-to-reach places. If you need to reach something above you, use a strong step stool that has a grab bar. Keep electrical cords out of the way. Do not use floor polish or wax that makes floors slippery. If you must use wax, use non-skid floor wax. Do not have throw rugs and other things on the floor that can make you trip. What can I do with my stairs? Do not leave any items on the stairs. Make sure that there are handrails on both sides of the stairs and use them. Fix handrails that are broken or loose. Make sure that handrails are as long as the stairways. Check any carpeting to make sure that it is firmly attached to the stairs. Fix any carpet that is loose or worn. Avoid having throw rugs at the top or bottom of the stairs. If you do have throw rugs, attach them to the floor with carpet tape. Make sure that you have a light switch at the top of the stairs and the bottom of the stairs. If you do not have them,  ask someone to add them for you. What else can I do to help prevent falls? Wear shoes that: Do not have high heels. Have rubber bottoms. Are comfortable and fit you well. Are closed at the toe. Do not wear sandals. If you use a stepladder: Make sure that it is fully opened. Do not climb a closed stepladder. Make sure that both sides of the stepladder are locked into place. Ask someone to hold it for you, if possible. Clearly mark and make sure that you can see: Any grab bars or handrails. First and last steps. Where the edge of each step is. Use tools that help you move around (mobility aids) if they are needed. These include: Canes. Walkers. Scooters. Crutches. Turn on the lights when you go into a dark area. Replace any light bulbs as soon as they burn out. Set up your furniture so you have a clear path. Avoid moving your furniture around. If any of your floors are uneven, fix them. If there are any pets around you, be aware of where they are. Review your medicines with your doctor. Some medicines can make you feel dizzy. This can increase your chance of falling. Ask your doctor what other things that you can do to help prevent falls. This information is not intended to replace advice given to you by your health care provider. Make sure you discuss any questions you have with your health care provider. Document Released: 03/24/2009 Document Revised: 11/03/2015 Document Reviewed: 07/02/2014 Elsevier Interactive Patient Education  2017 [employer].

## 2022-07-31 ENCOUNTER — Other Ambulatory Visit: Payer: Self-pay | Admitting: Gastroenterology

## 2022-07-31 ENCOUNTER — Ambulatory Visit
Admission: RE | Admit: 2022-07-31 | Discharge: 2022-07-31 | Disposition: A | Discharge status: Home / Self Care | Payer: Medicare Other | Source: Ambulatory Visit | Attending: Gastroenterology | Admitting: Gastroenterology

## 2022-07-31 DIAGNOSIS — R151 Fecal smearing: Secondary | ICD-10-CM | POA: Diagnosis not present

## 2022-07-31 DIAGNOSIS — K5900 Constipation, unspecified: Secondary | ICD-10-CM | POA: Diagnosis not present

## 2022-07-31 DIAGNOSIS — Z86010 Personal history of colonic polyps: Secondary | ICD-10-CM | POA: Diagnosis not present

## 2022-07-31 DIAGNOSIS — M47819 Spondylosis without myelopathy or radiculopathy, site unspecified: Secondary | ICD-10-CM | POA: Insufficient documentation

## 2022-07-31 DIAGNOSIS — K219 Gastro-esophageal reflux disease without esophagitis: Secondary | ICD-10-CM | POA: Diagnosis not present

## 2022-07-31 DIAGNOSIS — K5901 Slow transit constipation: Secondary | ICD-10-CM | POA: Diagnosis not present

## 2022-08-04 DIAGNOSIS — J0100 Acute maxillary sinusitis, unspecified: Secondary | ICD-10-CM | POA: Diagnosis not present

## 2022-08-16 DIAGNOSIS — K921 Melena: Secondary | ICD-10-CM | POA: Diagnosis not present

## 2022-08-20 ENCOUNTER — Other Ambulatory Visit: Payer: Self-pay

## 2022-08-20 ENCOUNTER — Telehealth: Payer: Self-pay

## 2022-08-20 ENCOUNTER — Encounter: Payer: Self-pay | Admitting: Family Medicine

## 2022-08-20 DIAGNOSIS — M545 Low back pain, unspecified: Secondary | ICD-10-CM

## 2022-08-20 DIAGNOSIS — M5136 Other intervertebral disc degeneration, lumbar region: Secondary | ICD-10-CM

## 2022-08-20 DIAGNOSIS — M5134 Other intervertebral disc degeneration, thoracic region: Secondary | ICD-10-CM

## 2022-08-20 NOTE — Telephone Encounter (Signed)
Pt called to place new referral with provider Eustace Moore, MD.   Referral Pended.

## 2022-08-21 ENCOUNTER — Other Ambulatory Visit: Payer: Self-pay

## 2022-08-21 DIAGNOSIS — M545 Low back pain, unspecified: Secondary | ICD-10-CM

## 2022-08-21 DIAGNOSIS — M5134 Other intervertebral disc degeneration, thoracic region: Secondary | ICD-10-CM

## 2022-08-21 DIAGNOSIS — M5136 Other intervertebral disc degeneration, lumbar region: Secondary | ICD-10-CM

## 2022-08-23 ENCOUNTER — Other Ambulatory Visit: Payer: Self-pay

## 2022-08-23 ENCOUNTER — Encounter (HOSPITAL_COMMUNITY): Payer: Self-pay

## 2022-08-23 ENCOUNTER — Emergency Department (HOSPITAL_COMMUNITY)
Admission: EM | Admit: 2022-08-23 | Discharge: 2022-08-23 | Disposition: A | Discharge status: Home / Self Care | Payer: Medicare Other | Attending: Emergency Medicine | Admitting: Emergency Medicine

## 2022-08-23 DIAGNOSIS — K922 Gastrointestinal hemorrhage, unspecified: Secondary | ICD-10-CM | POA: Diagnosis present

## 2022-08-23 DIAGNOSIS — I10 Essential (primary) hypertension: Secondary | ICD-10-CM | POA: Diagnosis not present

## 2022-08-23 DIAGNOSIS — K921 Melena: Secondary | ICD-10-CM | POA: Diagnosis not present

## 2022-08-23 DIAGNOSIS — Z79899 Other long term (current) drug therapy: Secondary | ICD-10-CM | POA: Insufficient documentation

## 2022-08-23 DIAGNOSIS — Z7901 Long term (current) use of anticoagulants: Secondary | ICD-10-CM | POA: Diagnosis not present

## 2022-08-23 DIAGNOSIS — Z91040 Latex allergy status: Secondary | ICD-10-CM | POA: Diagnosis not present

## 2022-08-23 DIAGNOSIS — R195 Other fecal abnormalities: Secondary | ICD-10-CM

## 2022-08-23 DIAGNOSIS — K5900 Constipation, unspecified: Secondary | ICD-10-CM

## 2022-08-23 LAB — CBC
HCT: 37.3 % (ref 36.0–46.0)
Hemoglobin: 11.8 g/dL — ABNORMAL LOW (ref 12.0–15.0)
MCH: 32.9 pg (ref 26.0–34.0)
MCHC: 31.6 g/dL (ref 30.0–36.0)
MCV: 103.9 fL — ABNORMAL HIGH (ref 80.0–100.0)
Platelets: 259 10*3/uL (ref 150–400)
RBC: 3.59 MIL/uL — ABNORMAL LOW (ref 3.87–5.11)
RDW: 13.4 % (ref 11.5–15.5)
WBC: 6.9 10*3/uL (ref 4.0–10.5)
nRBC: 0 % (ref 0.0–0.2)

## 2022-08-23 LAB — COMPREHENSIVE METABOLIC PANEL
ALT: 11 U/L (ref 0–44)
AST: 18 U/L (ref 15–41)
Albumin: 3.5 g/dL (ref 3.5–5.0)
Alkaline Phosphatase: 47 U/L (ref 38–126)
Anion gap: 8 (ref 5–15)
BUN: 31 mg/dL — ABNORMAL HIGH (ref 8–23)
CO2: 24 mmol/L (ref 22–32)
Calcium: 8.7 mg/dL — ABNORMAL LOW (ref 8.9–10.3)
Chloride: 106 mmol/L (ref 98–111)
Creatinine, Ser: 1.43 mg/dL — ABNORMAL HIGH (ref 0.44–1.00)
GFR, Estimated: 38 mL/min — ABNORMAL LOW (ref >60)
Glucose, Bld: 95 mg/dL (ref 70–99)
Potassium: 4.5 mmol/L (ref 3.5–5.1)
Sodium: 138 mmol/L (ref 135–145)
Total Bilirubin: 1 mg/dL (ref 0.3–1.2)
Total Protein: 6.1 g/dL — ABNORMAL LOW (ref 6.5–8.1)

## 2022-08-23 LAB — ABO/RH: ABO/RH(D): A NEG

## 2022-08-23 LAB — TYPE AND SCREEN
ABO/RH(D): A NEG
Antibody Screen: NEGATIVE

## 2022-08-23 LAB — POC OCCULT BLOOD, ED: Fecal Occult Bld: NEGATIVE

## 2022-08-23 LAB — LIPASE, BLOOD: Lipase: 36 U/L (ref 11–51)

## 2022-08-23 NOTE — ED Provider Triage Note (Signed)
Emergency Medicine Provider Triage Evaluation Note  Brittany Kirk , a 77 y.o. female  was evaluated in triage.  Patient has been having GI problems for 2 to 3 months.  She has had fecal incontinence.  She was started on fiber which then constipated her so GI then started her on MiraLAX.  She continues to have fecal incontinence and says that it is dark and tarry.  She is on blood thinners.  No history of GI bleed.  No alcohol.  Does report some Pepto-Bismol use as well.  Review of Systems  Positive:  Negative:   Physical Exam  BP (!) 140/55 (BP Location: Left Arm)   Pulse (!) 59   Temp 98.1 F (36.7 C) (Oral)   Resp (!) 22   Ht '4\' 11"'$  (1.499 m)   Wt 55.8 kg   SpO2 100%   BMI 24.85 kg/m  Gen:   Awake, no distress   Resp:  Normal effort  MSK:   Moves extremities without difficulty  Other:    Medical Decision Making  Medically screening exam initiated at 2:02 PM.  Appropriate orders placed.  Colin Benton Fernicola was informed that the remainder of the evaluation will be completed by another provider, this initial triage assessment does not replace that evaluation, and the importance of remaining in the ED until their evaluation is complete.     Rhae Hammock, PA-C 08/23/22 1403

## 2022-08-23 NOTE — ED Provider Notes (Signed)
[facility] Provider Note   CSN: [id number] Arrival date & time: 08/23/22  1315     History  Chief Complaint  Patient presents with   GI Bleeding    Brittany Kirk is a 77 y.o. female who presents emergency department for evaluation of fecal incontinence.  Patient is 3 months status.  She has some nausea.  She has been GI specialist provider, Dr. Watt Climes is out of the office at this time however she has a scheduled appointment on April 10.  She denies abdominal pain, vomiting, weakness, near syncope, shortness of breath.  She did not not have any signs or symptoms of cauda equina, saddle anesthesia or overflow incontinence.  Patient states that she was instructed to take [employer] by her GI specialist and she did so.  This caused her to get constipated for 8 days.  She states she then took MiraLAX and since then she has been having diarrhea and soft stools that are leaking from her bottom..,  HPI     Home Medications Prior to Admission medications   Medication Sig Start Date End Date Taking? Authorizing Provider  acetaminophen (TYLENOL) 500 MG tablet Take 1,000 mg by mouth every 6 (six) hours as needed for moderate pain or headache.    [provider]  apixaban (ELIQUIS) 5 MG TABS tablet Take 1 tablet (5 mg total) by mouth 2 (two) times daily. 10/12/21   Lorretta Harp, MD  ascorbic acid (VITAMIN C) 1000 MG tablet Take 1,000 mg by mouth daily.    [provider]  atorvastatin (LIPITOR) 40 MG tablet TAKE 1 TABLET BY MOUTH EVERYDAY AT BEDTIME 06/06/22   Susy Frizzle, MD  Cholecalciferol (VITAMIN D) 125 MCG (5000 UT) CAPS Take 5,000 Units by mouth daily.    [provider]  diazepam (VALIUM) 5 MG tablet Take 5 mg by mouth as needed for sedation. 12/13/20   [provider]  furosemide (LASIX) 20 MG tablet TAKE 1 TABLET BY MOUTH EVERY DAY AS NEEDED 01/03/22   Lorretta Harp, MD  Magnesium 100 MG TABS Take  100 mg by mouth daily.    [provider]  Omeprazole 20 MG TBEC Take 20 mg by mouth every evening.    [provider]  valsartan (DIOVAN) 40 MG tablet Take 1 tablet (40 mg total) by mouth daily. Hold for Blood Pressure <120 01/05/22   Baldwin Jamaica, PA-C      Allergies    Penicillins, Fosamax [alendronate], Levofloxacin, Mexiletine, Cefaclor, Latex, Metronidazole, Naproxen, Nsaids, Septra [bactrim], Sulfonamide derivatives, and Tape    Review of Systems   Review of Systems  Physical Exam Updated Vital Signs BP (!) 140/55 (BP Location: Left Arm)   Pulse (!) 59   Temp 98.1 F (36.7 C) (Oral)   Resp (!) 22   Ht '4\' 11"'$  (1.499 m)   Wt 55.8 kg   SpO2 100%   BMI 24.85 kg/m  Physical Exam Vitals and nursing note reviewed.  Constitutional:      General: She is not in acute distress.    Appearance: She is well-developed. She is not diaphoretic.  HENT:     Head: Normocephalic and atraumatic.     Right Ear: External ear normal.     Left Ear: External ear normal.     Nose: Nose normal.     Mouth/Throat:     Mouth: Mucous membranes are moist.  Eyes:     General: No scleral  icterus.    Conjunctiva/sclera: Conjunctivae normal.  Cardiovascular:     Rate and Rhythm: Normal rate and regular rhythm.     Heart sounds: Normal heart sounds. No murmur heard.    No friction rub. No gallop.  Pulmonary:     Effort: Pulmonary effort is normal. No respiratory distress.     Breath sounds: Normal breath sounds.  Abdominal:     General: Bowel sounds are normal. There is no distension.     Palpations: Abdomen is soft. There is no mass.     Tenderness: There is no abdominal tenderness. There is no guarding.  Genitourinary:    Comments: On exam the patient has black stool, normal rectal tone and and the soft stool in her rectal vault.  Musculoskeletal:     Cervical back: Normal range of motion.  Skin:    General: Skin is warm and dry.  Neurological:     Mental Status: She  is alert and oriented to person, place, and time.  Psychiatric:        Behavior: Behavior normal.     ED Results / Procedures / Treatments   Labs (all labs ordered are listed, but only abnormal results are displayed) Labs Reviewed  COMPREHENSIVE METABOLIC PANEL - Abnormal; Notable for the following components:      Result Value   BUN 31 (*)    Creatinine, Ser 1.43 (*)    Calcium 8.7 (*)    Total Protein 6.1 (*)    GFR, Estimated 38 (*)    All other components within normal limits  CBC - Abnormal; Notable for the following components:   RBC 3.59 (*)    Hemoglobin 11.8 (*)    MCV 103.9 (*)    All other components within normal limits  LIPASE, BLOOD  POC OCCULT BLOOD, ED  TYPE AND SCREEN  ABO/RH    EKG None  Radiology No results found.  Procedures Procedures    Medications Ordered in ED Medications - No data to display  ED Course/ Medical Decision Making/ A&P Clinical Course as of 08/23/22 1805  Thu Aug 23, 2022  1804 RBC(!): 3.59 [AH]  1804 Hemoglobin(!): 11.8 Hemoglobin at baseline [AH]  1804 BUN(!): 31 [AH]  1804 Creatinine(!): 1.43 Creatinine and BUN at baseline [AH]    Clinical Course User Index [AH] Margarita Mail, PA-C                             Medical Decision Making 77 year-old female here with complaint of fecal incontinence and dark stools. Highest concern for for this lady on Eliquis is potential for GI bleed.  She does not have signs or symptoms of cauda equina.  On exam the patient has black stool, normal rectal tone and and the soft stool in her rectal vault. After review of all data points patient's hemoglobin and BUN and creatinine appear to be at baseline.  She has no abdominal tenderness.  Patient and her husband are extremely frustrated because they feel like they are not getting answers to the reasons that she has dark stool or fecal incontinence.  During my discussion with the patient her blood pressure became markedly elevated.  I had  a long discussion with the patient about her chronic dark stools and her chronic fecal incontinence.  Given the fact that she does not have any evidence of an active GI bleed despite the fact that she is on blood thinners with hemodynamic stability  do not know that there is anything further I am able to offer her especially in the fact that we are not GI specialist.  She does have an established relationship with a GI specialist and has a set up appointment for April 10.  Will try to send a message to the clinic to see if she can get any earlier appointments however in the meantime advised that she take fiber daily to help give her stool some bulk and return for emergency conditions such as severe abdominal pain, lightheadedness, shortness of breath, weakness, near syncope or syncope, racing heart.  These answers did not seem to satisfy the patient well although I answered all questions to the best of my ability as an emergency medicine provider and not a GI specialist.  Although patient is dissatisfied with the workup and my answers for her in terms of emergency medicine she appears clear for discharge and close outpatient follow-up with her specialist.  Amount and/or Complexity of Data Reviewed Labs: ordered. Decision-making details documented in ED Course. Discussion of management or test interpretation with external provider(s): Discussed with Dr. Langston Masker who agrees with work up and plan for discharge.      Final Clinical Impression(s) / ED Diagnoses Final diagnoses:  Fecal incontinence alternating with constipation  Dark stools  Elevated blood pressure reading with diagnosis of hypertension    Rx / DC Orders ED Discharge Orders     None         Margarita Mail, PA-C 08/23/22 1810    Wyvonnia Dusky, MD 08/23/22 2126

## 2022-08-23 NOTE — ED Notes (Signed)
2 Iv attempts made to obtain Iv access, unsuccessful. Will request 2nd nurse to attempt.

## 2022-08-23 NOTE — ED Triage Notes (Signed)
Pt c/o black stoolx2 mos. Pt takes eliquis. Pt c/o weakness and dizzinessx62mo. Pt denies SOB. Pt c/o incontinence of stoolx222mo

## 2022-08-23 NOTE — Discharge Instructions (Addendum)
Contact a health care provider if: You have pain or tenderness in your abdomen. You have a fever. You feel weak or nauseous. You cannot poop. You vomit, and the vomit has blood or something that looks like coffee grounds in it. You feel extremely weak, or sob. Get help right away if: You faint or feel light headed with standing. You have severe pain in your rectum.

## 2022-08-29 ENCOUNTER — Telehealth: Payer: Self-pay

## 2022-08-29 NOTE — Telephone Encounter (Signed)
   Pre-operative Risk Assessment    Patient Name: Brittany Kirk  DOB: 03-Jun-1946 MRN: [id number]     Request for Surgical Clearance    Procedure:  Colonoscopy   Date of Surgery:  Clearance 09/13/22                                 Surgeon:  Dr. Watt Climes  Surgeon's Group or Practice Name:  [facility]  Phone number:  [phone] Fax number:  [phone]   Type of Clearance Requested:   - Pharmacy:  Hold Apixaban (Eliquis)     Type of Anesthesia:  Propofol    Additional requests/questions:    [name]   08/29/2022, 11:48 AM

## 2022-08-29 NOTE — Telephone Encounter (Signed)
Patient with diagnosis of atrial fibrillation on Eliquis for anticoagulation.    Procedure: colonoscopy Date of procedure: 09/13/22   CHA2DS2-VASc Score = 5   This indicates a 7.2% annual risk of stroke. The patient's score is based upon: CHF History: 0 HTN History: 1 Diabetes History: 0 Stroke History: 0 Vascular Disease History: 1 Age Score: 2 Gender Score: 1   CrCl 29 Platelet count 259  Per office protocol, patient can hold Eliquis for 2 days prior to procedure.   Patient will not need bridging with Lovenox (enoxaparin) around procedure.  **This guidance is not considered finalized until pre-operative APP has relayed final recommendations.**

## 2022-08-29 NOTE — Telephone Encounter (Signed)
  Patient Consent for Virtual Visit         Brittany Kirk has provided verbal consent on 08/29/2022 for a virtual visit (video or telephone).   CONSENT FOR VIRTUAL VISIT FOR:  Brittany Kirk  By participating in this virtual visit I agree to the following:  I hereby voluntarily request, consent and authorize [facility] and its employed or contracted physicians, physician assistants, nurse practitioners or other licensed health care professionals (the Practitioner), to provide me with telemedicine health care services (the "Services") as deemed necessary by the treating Practitioner. I acknowledge and consent to receive the Services by the Practitioner via telemedicine. I understand that the telemedicine visit will involve communicating with the Practitioner through live audiovisual communication technology and the disclosure of certain medical information by electronic transmission. I acknowledge that I have been given the opportunity to request an in-person assessment or other available alternative prior to the telemedicine visit and am voluntarily participating in the telemedicine visit.  I understand that I have the right to withhold or withdraw my consent to the use of telemedicine in the course of my care at any time, without affecting my right to future care or treatment, and that the Practitioner or I may terminate the telemedicine visit at any time. I understand that I have the right to inspect all information obtained and/or recorded in the course of the telemedicine visit and may receive copies of available information for a reasonable fee.  I understand that some of the potential risks of receiving the Services via telemedicine include:  Delay or interruption in medical evaluation due to technological equipment failure or disruption; Information transmitted may not be sufficient (e.g. poor resolution of images) to allow for appropriate medical decision making by the  Practitioner; and/or  In rare instances, security protocols could fail, causing a breach of personal health information.  Furthermore, I acknowledge that it is my responsibility to provide information about my medical history, conditions and care that is complete and accurate to the best of my ability. I acknowledge that Practitioner's advice, recommendations, and/or decision may be based on factors not within their control, such as incomplete or inaccurate data provided by me or distortions of diagnostic images or specimens that may result from electronic transmissions. I understand that the practice of medicine is not an exact science and that Practitioner makes no warranties or guarantees regarding treatment outcomes. I acknowledge that a copy of this consent can be made available to me via my patient portal ([facility]), or I can request a printed copy by calling the office of [facility].    I understand that my insurance will be billed for this visit.   I have read or had this consent read to me. I understand the contents of this consent, which adequately explains the benefits and risks of the Services being provided via telemedicine.  I have been provided ample opportunity to ask questions regarding this consent and the Services and have had my questions answered to my satisfaction. I give my informed consent for the services to be provided through the use of telemedicine in my medical care  '

## 2022-08-29 NOTE — Telephone Encounter (Signed)
   Name: Brittany Kirk  DOB: Apr 17, 1946  MRN: [id number]  Primary Cardiologist: [name], MD   Preoperative team, please contact this patient and set up a phone call appointment for further preoperative risk assessment. Please obtain consent and complete medication review. Thank you for your help.  I confirm that guidance regarding antiplatelet and oral anticoagulation therapy has been completed and, if necessary, noted below.  Per Pharm D:  Patient with diagnosis of atrial fibrillation on Eliquis for anticoagulation.     Procedure: colonoscopy Date of procedure: 09/13/22     CHA2DS2-VASc Score = 5   This indicates a 7.2% annual risk of stroke. The patient's score is based upon: CHF History: 0 HTN History: 1 Diabetes History: 0 Stroke History: 0 Vascular Disease History: 1 Age Score: 2 Gender Score: 1   CrCl 29 Platelet count 259   Per office protocol, patient can hold Eliquis for 2 days prior to procedure.   Patient will not need bridging with Lovenox (enoxaparin) around procedure.     Mayra Reel, NP 08/29/2022, 4:45 PM [facility]

## 2022-08-29 NOTE — Telephone Encounter (Signed)
Patient scheduled for tele health visit on 09/05/22. Med rec and consent done

## 2022-08-30 ENCOUNTER — Telehealth: Payer: Self-pay | Admitting: *Deleted

## 2022-08-30 NOTE — Telephone Encounter (Signed)
        Patient  visited [facility] on 08/23/2022  for treatment    Telephone encounter attempt :  1st  A HIPAA compliant voice message was left requesting a return call.  Instructed patient to call back at [phone].  [place] [phone] [address] , [place] [address] Email : [email or web address]

## 2022-08-31 ENCOUNTER — Telehealth: Payer: Self-pay | Admitting: *Deleted

## 2022-08-31 NOTE — Telephone Encounter (Signed)
        Patient  visited [facility] on 08/23/2022  for treatment    Telephone encounter attempt :  2nd  A HIPAA compliant voice message was left requesting a return call.  Instructed patient to call back at [phone].  [place] [phone] [address] , [place] [address] Email : [email or web address]

## 2022-09-03 NOTE — Progress Notes (Unsigned)
Virtual Visit via Telephone Note   Because of Brittany Kirk's co-morbid illnesses, she is at least at moderate risk for complications without adequate follow up.  This format is felt to be most appropriate for this patient at this time.  The patient did not have access to video technology/had technical difficulties with video requiring transitioning to audio format only (telephone).  All issues noted in this document were discussed and addressed.  No physical exam could be performed with this format.  Please refer to the patient's chart for her consent to telehealth for [facility].  Evaluation Performed:  Preoperative cardiovascular risk assessment _____________   Date:  09/03/2022   Patient ID:  Brittany Kirk, DOB 1945-08-18, MRN [id number] Patient Location:  Home Provider location:   Office  Primary Care Provider:  Susy Frizzle, MD Primary Cardiologist:  [name], MD  Chief Complaint / Patient Profile   77 y.o. y/o female with a h/o  CAD with moderately severe ramus branch ostial disease with noncritical disease otherwise and normal LVEF on cath 10/2017, PAF on chronic anticoagulation, PAD, previous tobacco use, chronic venous insufficiency, carotid artery disease 60, HLD, atrial fibrillation ablation 04/14/2021 elevated burden of PVCs on amiodarone, HTN who is pending Colonoscopy and presents today for telephonic preoperative cardiovascular risk assessment.  History of Present Illness    Brittany Kirk is a 77 y.o. female who presents via audio/video conferencing for a telehealth visit today.  Pt was last seen in cardiology clinic on 06/06/22 by Dr. Curt Kirk.  At that time Brittany Kirk was doing well.  The patient is now pending procedure as outlined above. Since her last visit, she *** denies chest pain, shortness of breath, lower extremity edema, fatigue, palpitations, melena, hematuria, hemoptysis, diaphoresis, weakness, presyncope, syncope, orthopnea,  and PND.   Past Medical History    Past Medical History:  Diagnosis Date   Acid reflux    Allergic rhinitis    Anxiety    Asymptomatic bilateral carotid artery stenosis    40-59% (02/2018)   Atrial fibrillation ([place]) 07/2019   Breast cancer ([place]) 2015   Right Breast Cancer   CAD (coronary artery disease)    Depression    Emphysema    no longer uses inhalers   Gallstones    nausea, pain upper right abdomen   GERD (gastroesophageal reflux disease)    H/O hiatal hernia    Hiatal hernia    History of skin cancer    Hyperlipidemia    Hypertension    Multinodular goiter (nontoxic)    Scoliosis    Skin cancer    Tobacco user    Wears dentures    top   Past Surgical History:  Procedure Laterality Date   ABDOMINAL AORTOGRAM W/LOWER EXTREMITY N/A 02/05/2022   Procedure: ABDOMINAL AORTOGRAM W/LOWER EXTREMITY;  Surgeon: Lorretta Harp, MD;  Location: [facility];  Service: Cardiovascular;  Laterality: N/A;   ATRIAL FIBRILLATION ABLATION N/A 04/14/2021   Procedure: ATRIAL FIBRILLATION ABLATION;  Surgeon: Constance Haw, MD;  Location: [facility];  Service: Cardiovascular;  Laterality: N/A;   BREAST LUMPECTOMY Right 2015   CHOLECYSTECTOMY  07/08/2012   Procedure: LAPAROSCOPIC CHOLECYSTECTOMY WITH INTRAOPERATIVE CHOLANGIOGRAM;  Surgeon: Gayland Curry, MD,FACS;  [facility];  Service: General;  Laterality: N/A;  Laparoscopic Cholecystectomy with Intraoperative Cholangiogram   COLONOSCOPY     ESOPHAGOSCOPY N/A 05/29/2017   Procedure: ESOPHAGOSCOPY;  Surgeon: Clarene Essex, MD;  Location: [facility];  Service: Endoscopy;  Laterality: N/A;  botox injection   HAND SURGERY Left    wrist   LEFT HEART CATH AND CORONARY ANGIOGRAPHY N/A 10/11/2017   Procedure: LEFT HEART CATH AND CORONARY ANGIOGRAPHY;  Surgeon: Martinique, Peter M, MD;  Location: [facility];  Service: Cardiovascular;  Laterality: N/A;   TONSILECTOMY, ADENOIDECTOMY, BILATERAL MYRINGOTOMY AND TUBES      TUBAL LIGATION      Allergies  Allergies  Allergen Reactions   Penicillins Anaphylaxis, Itching, Swelling and Rash    Has patient had a PCN reaction causing immediate rash, facial/tongue/throat swelling, SOB or lightheadedness with hypotension: Yes Has patient had a PCN reaction causing severe rash involving mucus membranes or skin necrosis: No Has patient had a PCN reaction that required hospitalization: Yes Has patient had a PCN reaction occurring within the last 10 years: No If all of the above answers are "NO", then may proceed with Cephalosporin use.    Fosamax [Alendronate]     myalgias   Levofloxacin Nausea Only and Other (See Comments)    Causes headache, fatigue and dizziness   Mexiletine Itching, Nausea Only and Other (See Comments)   Cefaclor Itching, Swelling and Rash   Latex Itching and Rash   Metronidazole Itching, Swelling and Rash   Naproxen Itching, Swelling and Rash   Nsaids Itching, Swelling and Rash    Ibuprofen IS tolerated   Septra [Bactrim] Itching, Swelling and Rash   Sulfonamide Derivatives Itching, Swelling and Rash   Tape Itching and Rash    ONLY USE PAPER TAPE    Home Medications    Prior to Admission medications   Medication Sig Start Date End Date Taking? Authorizing Provider  acetaminophen (TYLENOL) 500 MG tablet Take 1,000 mg by mouth every 6 (six) hours as needed for moderate pain or headache.    [provider]  apixaban (ELIQUIS) 5 MG TABS tablet Take 1 tablet (5 mg total) by mouth 2 (two) times daily. 10/12/21   Lorretta Harp, MD  ascorbic acid (VITAMIN C) 1000 MG tablet Take 1,000 mg by mouth daily.    [provider]  atorvastatin (LIPITOR) 40 MG tablet TAKE 1 TABLET BY MOUTH EVERYDAY AT BEDTIME Patient taking differently: Take 40 mg by mouth at bedtime. 06/06/22   Susy Frizzle, MD  Cholecalciferol (VITAMIN D) 125 MCG (5000 UT) CAPS Take 5,000 Units by mouth daily.    [provider]  diazepam (VALIUM)  5 MG tablet Take 5 mg by mouth as needed for sedation. 12/13/20   [provider]  furosemide (LASIX) 20 MG tablet TAKE 1 TABLET BY MOUTH EVERY DAY AS NEEDED 01/03/22   Lorretta Harp, MD  omeprazole (PRILOSEC) 20 MG capsule Take 20 mg by mouth daily.    [provider]  valsartan (DIOVAN) 40 MG tablet Take 1 tablet (40 mg total) by mouth daily. Hold for Blood Pressure <120 01/05/22   Baldwin Jamaica, PA-C    Physical Exam    Vital Signs:  Brittany Kirk does not have vital signs available for review today.***  Given telephonic nature of communication, physical exam is limited. AAOx3. NAD. Normal affect.  Speech and respirations are unlabored.  Accessory Clinical Findings    None  Assessment & Plan    1.  Preoperative Cardiovascular Risk Assessment:  The patient was advised that if she develops new symptoms prior to surgery to contact our office to arrange for a follow-up visit, and she verbalized understanding.  Per office  protocol, patient can hold Eliquis for 2 days prior to procedure.   Patient will not need bridging with Lovenox (enoxaparin) around procedure.  A copy of this note will be routed to requesting surgeon.  Time:   Today, I have spent *** minutes with the patient with telehealth technology discussing medical history, symptoms, and management plan.     Mihaela Fajardo, Lanice Schwab, NP  09/03/2022, 3:10 PM

## 2022-09-04 ENCOUNTER — Other Ambulatory Visit: Payer: Self-pay | Admitting: Family Medicine

## 2022-09-04 DIAGNOSIS — M47816 Spondylosis without myelopathy or radiculopathy, lumbar region: Secondary | ICD-10-CM | POA: Diagnosis not present

## 2022-09-04 DIAGNOSIS — Z6825 Body mass index (BMI) 25.0-25.9, adult: Secondary | ICD-10-CM | POA: Diagnosis not present

## 2022-09-04 NOTE — Telephone Encounter (Signed)
Requested Prescriptions  Pending Prescriptions Disp Refills   atorvastatin (LIPITOR) 40 MG tablet [Pharmacy Med Name: ATORVASTATIN 40 MG TABLET] 90 tablet 0    Sig: TAKE 1 TABLET BY MOUTH EVERYDAY AT BEDTIME     Cardiovascular:  Antilipid - Statins Failed - 09/04/2022  2:38 AM      Failed - Valid encounter within last 12 months    Recent Outpatient Visits           1 year ago Paroxysmal atrial fibrillation ([place])   [place] Pickard, Cammie Mcgee, MD   1 year ago Viral upper respiratory tract infection   [place] Eulogio Bear, NP   1 year ago [place] Dennard Schaumann, Cammie Mcgee, MD   1 year ago Strain of lumbar region, initial encounter   [place] Pickard, Cammie Mcgee, MD   1 year ago Left hip pain   [place] Pickard, Cammie Mcgee, MD       Future Appointments             In 1 week Pickard, Cammie Mcgee, MD [place], [place]   In 2 months Garden, Ocie Doyne, MD [place] [facility]            Failed - Lipid Panel in normal range within the last 12 months    Cholesterol, Total  Date Value Ref Range Status  02/28/2022 152 100 - 199 mg/dL Final   LDL Cholesterol (Calc)  Date Value Ref Range Status  07/14/2018 61 mg/dL (calc) Final    Comment:    Reference range: <100 . Desirable range <100 mg/dL for primary prevention;   <70 mg/dL for patients with CHD or diabetic patients  with > or = 2 CHD risk factors. Marland Kitchen LDL-C is now calculated using the Martin-Hopkins  calculation, which is a validated novel method providing  better accuracy than the Friedewald equation in the  estimation of LDL-C.  Cresenciano Genre et al. Annamaria Helling. [id number]): 2061-2068  (http://education.[employer].com/faq/FAQ164)    LDL Chol Calc (NIH)  Date Value Ref Range Status  02/28/2022 70 0 - 99 mg/dL Final   HDL  Date Value Ref Range Status   02/28/2022 71 >39 mg/dL Final   Triglycerides  Date Value Ref Range Status  02/28/2022 49 0 - 149 mg/dL Final         Passed - Patient is not pregnant

## 2022-09-05 ENCOUNTER — Ambulatory Visit: Payer: Medicare Other | Admitting: Nurse Practitioner

## 2022-09-11 ENCOUNTER — Ambulatory Visit (INDEPENDENT_AMBULATORY_CARE_PROVIDER_SITE_OTHER): Payer: Medicare Other | Admitting: Family Medicine

## 2022-09-11 ENCOUNTER — Encounter: Payer: Self-pay | Admitting: Family Medicine

## 2022-09-11 VITALS — BP 132/76 | HR 61 | Temp 98.0°F | Ht 59.0 in | Wt 122.0 lb

## 2022-09-11 DIAGNOSIS — I2510 Atherosclerotic heart disease of native coronary artery without angina pectoris: Secondary | ICD-10-CM

## 2022-09-11 NOTE — Progress Notes (Signed)
Subjective:    Patient ID: Brittany Kirk, female    DOB: [birth year], 77 y.o.   MRN: [id number]  Brittany Kirk is a 77 y.o. female with medical history significant of CAD with 85% ostial lesion that they cant stent without risking LAD, PAF s/p ablation on Eliquis.  Also has PVD.  Patient had a vascular ultrasound last summer that showed moderate reduction in her ABIs bilaterally with abnormal TBI's in both the.  Today on examination she has palpable dorsalis pedis and posterior tibialis pulses bilaterally.  Her toes are purple in color distal to the MTP joint however capillary refill is less than 5 seconds.  She denies any claudication.  There are no open sores or nonhealing wounds.  She denies any chest pain or shortness of breath or dyspnea on exertion.  However she is quite sedentary.  Blood pressure today is excellent    Past Medical History:  Diagnosis Date   Acid reflux    Allergic rhinitis    Anxiety    Asymptomatic bilateral carotid artery stenosis    40-59% (02/2018)   Atrial fibrillation ([place]) 07/2019   Breast cancer ([place]) 2015   Right Breast Cancer   CAD (coronary artery disease)    Depression    Emphysema    no longer uses inhalers   Gallstones    nausea, pain upper right abdomen   GERD (gastroesophageal reflux disease)    H/O hiatal hernia    Hiatal hernia    History of skin cancer    Hyperlipidemia    Hypertension    Multinodular goiter (nontoxic)    Scoliosis    Skin cancer    Tobacco user    Wears dentures    top   Past Surgical History:  Procedure Laterality Date   ABDOMINAL AORTOGRAM W/LOWER EXTREMITY N/A 02/05/2022   Procedure: ABDOMINAL AORTOGRAM W/LOWER EXTREMITY;  Surgeon: Lorretta Harp, MD;  Location: [facility];  Service: Cardiovascular;  Laterality: N/A;   ATRIAL FIBRILLATION ABLATION N/A 04/14/2021   Procedure: ATRIAL FIBRILLATION ABLATION;  Surgeon: Constance Haw, MD;  Location: [facility];  Service: Cardiovascular;   Laterality: N/A;   BREAST LUMPECTOMY Right 2015   CHOLECYSTECTOMY  07/08/2012   Procedure: LAPAROSCOPIC CHOLECYSTECTOMY WITH INTRAOPERATIVE CHOLANGIOGRAM;  Surgeon: Gayland Curry, MD,FACS;  [facility];  Service: General;  Laterality: N/A;  Laparoscopic Cholecystectomy with Intraoperative Cholangiogram   COLONOSCOPY     ESOPHAGOSCOPY N/A 05/29/2017   Procedure: ESOPHAGOSCOPY;  Surgeon: Clarene Essex, MD;  Location: [facility];  Service: Endoscopy;  Laterality: N/A;  botox injection   HAND SURGERY Left    wrist   LEFT HEART CATH AND CORONARY ANGIOGRAPHY N/A 10/11/2017   Procedure: LEFT HEART CATH AND CORONARY ANGIOGRAPHY;  Surgeon: Martinique, Peter M, MD;  Location: [facility];  Service: Cardiovascular;  Laterality: N/A;   TONSILECTOMY, ADENOIDECTOMY, BILATERAL MYRINGOTOMY AND TUBES     TUBAL LIGATION     Current Outpatient Medications on File Prior to Visit  Medication Sig Dispense Refill   acetaminophen (TYLENOL) 500 MG tablet Take 1,000 mg by mouth every 6 (six) hours as needed for moderate pain or headache.     apixaban (ELIQUIS) 5 MG TABS tablet Take 1 tablet (5 mg total) by mouth 2 (two) times daily. 60 tablet 6   ascorbic acid (VITAMIN C) 1000 MG tablet Take 1,000 mg by mouth daily.     atorvastatin (LIPITOR) 40 MG tablet TAKE 1 TABLET BY MOUTH EVERYDAY  AT BEDTIME 90 tablet 0   Cholecalciferol (VITAMIN D) 125 MCG (5000 UT) CAPS Take 5,000 Units by mouth daily.     diazepam (VALIUM) 5 MG tablet Take 5 mg by mouth as needed for sedation.     furosemide (LASIX) 20 MG tablet TAKE 1 TABLET BY MOUTH EVERY DAY AS NEEDED 30 tablet 6   omeprazole (PRILOSEC) 20 MG capsule Take 20 mg by mouth daily.     valsartan (DIOVAN) 40 MG tablet Take 1 tablet (40 mg total) by mouth daily. Hold for Blood Pressure <120 90 tablet 3   No current facility-administered medications on file prior to visit.   Allergies  Allergen Reactions   Penicillins Anaphylaxis, Itching, Swelling and Rash    Has  patient had a PCN reaction causing immediate rash, facial/tongue/throat swelling, SOB or lightheadedness with hypotension: Yes Has patient had a PCN reaction causing severe rash involving mucus membranes or skin necrosis: No Has patient had a PCN reaction that required hospitalization: Yes Has patient had a PCN reaction occurring within the last 10 years: No If all of the above answers are "NO", then may proceed with Cephalosporin use.    Fosamax [Alendronate]     myalgias   Levofloxacin Nausea Only and Other (See Comments)    Causes headache, fatigue and dizziness   Mexiletine Itching, Nausea Only and Other (See Comments)   Cefaclor Itching, Swelling and Rash   Latex Itching and Rash   Metronidazole Itching, Swelling and Rash   Naproxen Itching, Swelling and Rash   Nsaids Itching, Swelling and Rash    Ibuprofen IS tolerated   Septra [Bactrim] Itching, Swelling and Rash   Sulfonamide Derivatives Itching, Swelling and Rash   Tape Itching and Rash    ONLY USE PAPER TAPE   Social History   Socioeconomic History   Marital status: Married    Spouse name: Psychologist, prison and probation services   Number of children: 1   Years of education: Not on file   Highest education level: Some college, no degree  Occupational History   Occupation: retired Clinical cytogeneticist: RETIRED  Tobacco Use   Smoking status: Former    Packs/day: 0.50    Years: 46.00    Additional pack years: 0.00    Total pack years: 23.00    Types: Cigarettes    Quit date: 02/13/2011    Years since quitting: 11.5   Smokeless tobacco: Never  Vaping Use   Vaping Use: Never used  Substance and Sexual Activity   Alcohol use: No   Drug use: No   Sexual activity: Not on file  Other Topics Concern   Not on file  Social History Narrative   Married since 1991, second marriage.    1 son   1 grandson   1 granddaughter   2 step children   3 step grandchildren   Social Determinants of Health   Financial Resource Strain: Low Risk   (09/09/2022)   Overall Financial Resource Strain (CARDIA)    Difficulty of Paying Living Expenses: Not hard at all  Food Insecurity: No Food Insecurity (09/09/2022)   Hunger Vital Sign    Worried About Running Out of Food in the Last Year: Never true    Ran Out of Food in the Last Year: Never true  Transportation Needs: No Transportation Needs (09/09/2022)   PRAPARE - Hydrologist (Medical): No    Lack of Transportation (Non-Medical): No  Physical Activity: Insufficiently Active (07/12/2022)  Exercise Vital Sign    Days of Exercise per Week: 3 days    Minutes of Exercise per Session: 20 min  Stress: Stress Concern Present (09/09/2022)   [place]    Feeling of Stress : To some extent  Social Connections: Socially Integrated (09/09/2022)   Social Connection and Isolation Panel [NHANES]    Frequency of Communication with Friends and Family: Twice a week    Frequency of Social Gatherings with Friends and Family: Once a week    Attends Religious Services: More than 4 times per year    Active Member of [employer] or Organizations: No    Attends Music therapist: More than 4 times per year    Marital Status: Married  Human resources officer Violence: Not At Risk (07/12/2022)   Humiliation, Afraid, Rape, and Kick questionnaire    Fear of Current or Ex-Partner: No    Emotionally Abused: No    Physically Abused: No    Sexually Abused: No     Review of Systems  All other systems reviewed and are negative.      Objective:   Physical Exam Vitals reviewed.  Constitutional:      General: She is not in acute distress.    Appearance: She is well-developed. She is not diaphoretic.  HENT:     Head: Normocephalic and atraumatic.     Right Ear: External ear normal. Drainage present.     Left Ear: External ear normal. Decreased hearing noted. Drainage and swelling present.     Mouth/Throat:      Pharynx: No oropharyngeal exudate.  Eyes:     Conjunctiva/sclera: Conjunctivae normal.     Pupils: Pupils are equal, round, and reactive to light.  Neck:     Thyroid: No thyromegaly.     Vascular: No JVD.  Cardiovascular:     Rate and Rhythm: Normal rate and regular rhythm.     Pulses:          Dorsalis pedis pulses are 1+ on the right side and 1+ on the left side.       Posterior tibial pulses are 1+ on the right side and 1+ on the left side.     Heart sounds: Normal heart sounds. No murmur heard.    No friction rub. No gallop.  Pulmonary:     Effort: Pulmonary effort is normal. No respiratory distress.     Breath sounds: Normal breath sounds. No stridor. No wheezing or rales.  Abdominal:     General: Bowel sounds are normal. There is no distension.     Palpations: Abdomen is soft.     Tenderness: There is no abdominal tenderness. There is no guarding.  Musculoskeletal:     Cervical back: Normal range of motion.     Lumbar back: Spasms, tenderness and bony tenderness present. Decreased range of motion. Negative right straight leg raise test and negative left straight leg raise test.     Right lower leg: No edema.     Left lower leg: No edema.  Lymphadenopathy:     Cervical: No cervical adenopathy.  Neurological:     Mental Status: She is alert and oriented to person, place, and time.     Cranial Nerves: No cranial nerve deficit.     Sensory: No sensory deficit.     Motor: Tremor present. No atrophy, abnormal muscle tone or seizure activity.     Deep Tendon Reflexes: Reflexes are normal and symmetric.  Assessment & Plan:  Coronary artery disease involving native coronary artery of native heart without angina pectoris - Plan: CBC with Differential/Platelet, Lipid panel, COMPLETE METABOLIC PANEL WITH GFR Patient has known ASCVD with coronary artery disease as well as peripheral vascular disease.  Her blood pressure is well-controlled.  She is in normal sinus rhythm  today and she is appropriately anticoagulated due to her history of paroxysmal atrial fibrillation.  She is on Lipitor 40 mg daily.  Check a fasting lipid panel.  Goal LDL cholesterol to be less than 55 given her history of ASCVD.  While she does have moderate peripheral vascular disease there is no indication that she requires revascularization at the present time.  She denies claudication and there is no open wounds on the feet

## 2022-09-12 LAB — COMPLETE METABOLIC PANEL WITH GFR
AG Ratio: 1.8 (calc) (ref 1.0–2.5)
ALT: 9 U/L (ref 6–29)
AST: 15 U/L (ref 10–35)
Albumin: 3.8 g/dL (ref 3.6–5.1)
Alkaline phosphatase (APISO): 47 U/L (ref 37–153)
BUN/Creatinine Ratio: 23 (calc) — ABNORMAL HIGH (ref 6–22)
BUN: 30 mg/dL — ABNORMAL HIGH (ref 7–25)
CO2: 23 mmol/L (ref 20–32)
Calcium: 8.7 mg/dL (ref 8.6–10.4)
Chloride: 104 mmol/L (ref 98–110)
Creat: 1.33 mg/dL — ABNORMAL HIGH (ref 0.60–1.00)
Globulin: 2.1 g/dL (calc) (ref 1.9–3.7)
Glucose, Bld: 99 mg/dL (ref 65–99)
Potassium: 4.3 mmol/L (ref 3.5–5.3)
Sodium: 138 mmol/L (ref 135–146)
Total Bilirubin: 0.6 mg/dL (ref 0.2–1.2)
Total Protein: 5.9 g/dL — ABNORMAL LOW (ref 6.1–8.1)
eGFR: 41 mL/min/{1.73_m2} — ABNORMAL LOW (ref >=60)

## 2022-09-12 LAB — LIPID PANEL
Cholesterol: 129 mg/dL (ref <200)
HDL: 63 mg/dL (ref >=50)
LDL Cholesterol (Calc): 52 mg/dL (calc)
Non-HDL Cholesterol (Calc): 66 mg/dL (calc) (ref <130)
Total CHOL/HDL Ratio: 2 (calc) (ref <5.0)
Triglycerides: 63 mg/dL (ref <150)

## 2022-09-12 LAB — CBC WITH DIFFERENTIAL/PLATELET
Absolute Monocytes: 460 cells/uL (ref 200–950)
Basophils Absolute: 23 cells/uL (ref 0–200)
Basophils Relative: 0.3 %
Eosinophils Absolute: 31 cells/uL (ref 15–500)
Eosinophils Relative: 0.4 %
HCT: 36.4 % (ref 35.0–45.0)
Hemoglobin: 12.2 g/dL (ref 11.7–15.5)
Lymphs Abs: 2363.4 cells/uL (ref 850–3900)
MCH: 33.2 pg — ABNORMAL HIGH (ref 27.0–33.0)
MCHC: 33.5 g/dL (ref 32.0–36.0)
MCV: 98.9 fL (ref 80.0–100.0)
MPV: 10.5 fL (ref 7.5–12.5)
Monocytes Relative: 5.9 %
Neutro Abs: 4922 cells/uL (ref 1500–7800)
Neutrophils Relative %: 63.1 %
Platelets: 256 10*3/uL (ref 140–400)
RBC: 3.68 10*6/uL — ABNORMAL LOW (ref 3.80–5.10)
RDW: 12.6 % (ref 11.0–15.0)
Total Lymphocyte: 30.3 %
WBC: 7.8 10*3/uL (ref 3.8–10.8)

## 2022-09-13 ENCOUNTER — Encounter: Payer: Self-pay | Admitting: Family Medicine

## 2022-09-18 DIAGNOSIS — M47816 Spondylosis without myelopathy or radiculopathy, lumbar region: Secondary | ICD-10-CM | POA: Diagnosis not present

## 2022-09-18 DIAGNOSIS — M6281 Muscle weakness (generalized): Secondary | ICD-10-CM | POA: Diagnosis not present

## 2022-09-18 DIAGNOSIS — M47814 Spondylosis without myelopathy or radiculopathy, thoracic region: Secondary | ICD-10-CM | POA: Diagnosis not present

## 2022-09-20 ENCOUNTER — Encounter: Payer: Self-pay | Admitting: Cardiovascular Disease

## 2022-09-20 ENCOUNTER — Telehealth: Payer: Self-pay | Admitting: *Deleted

## 2022-09-20 DIAGNOSIS — K639 Disease of intestine, unspecified: Secondary | ICD-10-CM | POA: Diagnosis not present

## 2022-09-20 DIAGNOSIS — Z7901 Long term (current) use of anticoagulants: Secondary | ICD-10-CM | POA: Diagnosis not present

## 2022-09-20 DIAGNOSIS — Z86010 Personal history of colonic polyps: Secondary | ICD-10-CM | POA: Diagnosis not present

## 2022-09-20 NOTE — Telephone Encounter (Signed)
   Pre-operative Risk Assessment    Patient Name: Brittany Kirk  DOB: 1945/12/16 MRN: [id number]   PT HAS BEEN RESCHEDULED, THIS IS A NEW CLEARANCE.    Request for Surgical Clearance    Procedure:   COLONOSCOPY  Date of Surgery:  Clearance 10/08/22                                 Surgeon:  DR. Ewing Schlein Surgeon's Group or Practice Name:  [facility] Phone number:  [phone] Fax number:  [phone]   Type of Clearance Requested:   - Medical  - Pharmacy:  Hold Apixaban (Eliquis)     Type of Anesthesia:  Not Indicated (PROPOFOL?)   Additional requests/questions:    Brittany Kirk   09/20/2022, 4:18 PM

## 2022-09-21 NOTE — Telephone Encounter (Signed)
   Name: Brittany Kirk  DOB: [birth year]  MRN: [id number]  Primary Cardiologist: Nanetta Batty, MD   Preoperative team, please contact this patient and set up a phone call appointment for further preoperative risk assessment. Please obtain consent and complete medication review. Thank you for your help.  I confirm that guidance regarding antiplatelet and oral anticoagulation therapy has been completed and, if necessary, noted below.  Per Pharm D:  Patient with diagnosis of atrial fibrillation on Eliquis for anticoagulation.     Procedure: colonoscopy Date of procedure: 10/08/2022     Per office protocol, patient can hold Eliquis for 2 days prior to procedure.   Patient will not need bridging with Lovenox (enoxaparin) around procedure.     Carlos Levering, NP 09/21/2022, 10:22 AM [facility]

## 2022-09-21 NOTE — Telephone Encounter (Signed)
Previously cleared for same procedure on 3/20 phone note for anticipated procedure date of 4/4, assuming this was moved back. Ok to proceed with same rec and hold Eliquis 2 days prior.

## 2022-09-21 NOTE — Telephone Encounter (Signed)
Spoke with patient who is agreeable to do a tele visit on 4/18 at 9:20 am. Med rec and consent has be done.

## 2022-09-24 NOTE — Progress Notes (Unsigned)
Virtual Visit via Telephone Note   Because of Brittany Kirk's co-morbid illnesses, she is at least at moderate risk for complications without adequate follow up.  This format is felt to be most appropriate for this patient at this time.  The patient did not have access to video technology/had technical difficulties with video requiring transitioning to audio format only (telephone).  All issues noted in this document were discussed and addressed.  No physical exam could be performed with this format.  Please refer to the patient's chart for her consent to telehealth for [facility].  Evaluation Performed:  Preoperative cardiovascular risk assessment _____________   Date:  09/27/2022   Patient ID:  Brittany Kirk, DOB 10-Oct-1945, MRN [id number] Patient Location:  Home Provider location:   Office  Primary Care Provider:  Donita Brooks, MD Primary Cardiologist:  Nanetta Batty, MD  Chief Complaint / Patient Profile   77 y.o. y/o female with a h/o PAD, CAD with LHC 2019 that revealed moderate severity ramus branch ostial disease non-critical, normal LV function, unchanged from prior cath in 2010 previous tobacco use, atrial fibrillation s/p A-fib ablation 04/14/2021 on long-term anticoagulation, frequent PVCs on amiodarone, HTN, HLD who is pending colonoscopy and presents today for telephonic preoperative cardiovascular risk assessment.  History of Present Illness    Brittany Kirk is a 77 y.o. female who presents via audio/video conferencing for a telehealth visit today.  Pt was last seen in cardiology clinic on 05/17/22 by Dr. Elberta Fortis.  At that time Brittany Kirk was feeling fatigued and having muscle weakness. Her amiodarone was discontinued 06/11/22 and she reports no further episodes of palpitations. The patient is now pending procedure as outlined above. Since her last visit, she denies chest pain,  lower extremity edema, fatigue, palpitations, melena, hematuria,  hemoptysis, diaphoresis, weakness, presyncope, syncope, orthopnea, and PND. She has chronic shortness of breath that she feels is stable. She is only taking valsartan when SBP 120 or > which is rare. She is in PT on 2 days per week and does the exercises at home on the other days. Feels weak sometimes with walking long distance but feels that shortness of breath has been stable for some time. No chest pain.   Past Medical History    Past Medical History:  Diagnosis Date   Acid reflux    Allergic rhinitis    Anxiety    Asymptomatic bilateral carotid artery stenosis    40-59% (02/2018)   Atrial fibrillation 07/2019   Breast cancer 2015   Right Breast Cancer   CAD (coronary artery disease)    CKD (chronic kidney disease), stage III    Depression    Emphysema    no longer uses inhalers   Gallstones    nausea, pain upper right abdomen   GERD (gastroesophageal reflux disease)    H/O hiatal hernia    Hiatal hernia    History of skin cancer    Hyperlipidemia    Hypertension    Multinodular goiter (nontoxic)    Scoliosis    Skin cancer    Tobacco user    Wears dentures    top   Past Surgical History:  Procedure Laterality Date   ABDOMINAL AORTOGRAM W/LOWER EXTREMITY N/A 02/05/2022   Procedure: ABDOMINAL AORTOGRAM W/LOWER EXTREMITY;  Surgeon: Runell Gess, MD;  Location: [facility] INV[facility];  Service: Cardiovascular;  Laterality: N/A;   ATRIAL FIBRILLATION ABLATION N/A 04/14/2021   Procedure: ATRIAL FIBRILLATION ABLATION;  Surgeon: Elberta Fortis,  Andree Coss, MD;  Location: [facility] INVASIVE CV LAB;  Service: Cardiovascular;  Laterality: N/A;   BREAST LUMPECTOMY Right 2015   CHOLECYSTECTOMY  07/08/2012   Procedure: LAPAROSCOPIC CHOLECYSTECTOMY WITH INTRAOPERATIVE CHOLANGIOGRAM;  Surgeon: Atilano Ina, MD,FACS;  Location: [facility] ORS;  Service: General;  Laterality: N/A;  Laparoscopic Cholecystectomy with Intraoperative Cholangiogram   COLONOSCOPY     ESOPHAGOSCOPY N/A 05/29/2017   Procedure:  ESOPHAGOSCOPY;  Surgeon: Vida Rigger, MD;  Location: [facility] ENDOSCOPY;  Service: Endoscopy;  Laterality: N/A;  botox injection   HAND SURGERY Left    wrist   LEFT HEART CATH AND CORONARY ANGIOGRAPHY N/A 10/11/2017   Procedure: LEFT HEART CATH AND CORONARY ANGIOGRAPHY;  Surgeon: Swazil[place], Peter M, MD;  [facility]: [facility] INVASIVE CV LAB;  Service: Cardiovascular;  Laterality: N/A;   TONSILECTOMY, ADENOIDECTOMY, BILATERAL MYRINGOTOMY AND TUBES     TUBAL LIGATION      Allergies  Allergies  Allergen Reactions   Penicillins Anaphylaxis, Itching, Swelling and Rash    Has patient had a PCN reaction causing immediate rash, facial/tongue/throat swelling, SOB or lightheadedness with hypotension: Yes Has patient had a PCN reaction causing severe rash involving mucus membranes or skin necrosis: No Has patient had a PCN reaction that required hospitalization: Yes Has patient had a PCN reaction occurring within the last 10 years: No If all of the above answers are "NO", then may proceed with Cephalosporin use.    Fosamax [Alendronate]     myalgias   Levofloxacin Nausea Only and Other (See Comments)    Causes headache, fatigue and dizziness   Mexiletine Itching, Nausea Only and Other (See Comments)   Cefaclor Itching, Swelling and Rash   Latex Itching and Rash   Metronidazole Itching, Swelling and Rash   Naproxen Itching, Swelling and Rash   Nsaids Itching, Swelling and Rash    Ibuprofen IS tolerated   Septra [Bactrim] Itching, Swelling and Rash   Sulfonamide Derivatives Itching, Swelling and Rash   Tape Itching and Rash    ONLY USE PAPER TAPE   Wound Dressing Adhesive Itching and Rash    Home Medications    Prior to Admission medications   Medication Sig Start Date End Date Taking? Authorizing Provider  acetaminophen (TYLENOL) 500 MG tablet Take 1,000 mg by mouth every 6 (six) hours as needed for moderate pain or headache.    [provider]  apixaban (ELIQUIS) 5 MG TABS tablet Take 1 tablet  (5 mg total) by mouth 2 (two) times daily. 10/12/21   Runell Gess, MD  ascorbic acid (VITAMIN C) 1000 MG tablet Take 1,000 mg by mouth daily.    [provider]  atorvastatin (LIPITOR) 40 MG tablet TAKE 1 TABLET BY MOUTH EVERYDAY AT BEDTIME 09/04/22   Donita Brooks, MD  Cholecalciferol (VITAMIN D) 125 MCG (5000 UT) CAPS Take 5,000 Units by mouth daily.    [provider]  diazepam (VALIUM) 5 MG tablet Take 5 mg by mouth as needed for sedation. 12/13/20   [provider]  omeprazole (PRILOSEC) 20 MG capsule Take 20 mg by mouth daily.    [provider]  valsartan (DIOVAN) 40 MG tablet Take 1 tablet (40 mg total) by mouth daily. Hold for Blood Pressure <120 01/05/22   Sheilah Pigeon, PA-C    Physical Exam    Vital Signs:  Brittany Kirk does not have vital signs available for review today.  Given telephonic nature of communication, physical exam is limited. AAOx3. NAD. Normal affect.  Speech  and respirations are unlabored.  Accessory Clinical Findings    None  Assessment & Plan    1.  Preoperative Cardiovascular Risk Assessment: According to the Revised Cardiac Risk Index (RCRI), her Perioperative Risk of Major Cardiac Event is (%): 0.9. Her Functional Capacity in METs is: 4.4 according to the Duke Activity Status Index (DASI). The patient is doing well from a cardiac perspective. Therefore, based on ACC/AHA guidelines, the patient would be at acceptable risk for the planned procedure without further cardiovascular testing.   The patient was advised that if she develops new symptoms prior to surgery to contact our office to arrange for a follow-up visit, and she verbalized understanding.   Per office protocol, patient can hold Eliquis for 2 days prior to procedure. Patient will not need bridging with Lovenox (enoxaparin) around procedure.  A copy of this note will be routed to requesting surgeon.  Time:   Today, I have spent 10 minutes with  the patient with telehealth technology discussing medical history, symptoms, and management plan.    Brittany Aland, NP-C  09/27/2022, 9:30 AM [address] Office [phone] Fax [phone]

## 2022-09-25 DIAGNOSIS — M47814 Spondylosis without myelopathy or radiculopathy, thoracic region: Secondary | ICD-10-CM | POA: Diagnosis not present

## 2022-09-25 DIAGNOSIS — M6281 Muscle weakness (generalized): Secondary | ICD-10-CM | POA: Diagnosis not present

## 2022-09-25 DIAGNOSIS — M47816 Spondylosis without myelopathy or radiculopathy, lumbar region: Secondary | ICD-10-CM | POA: Diagnosis not present

## 2022-09-27 ENCOUNTER — Encounter: Payer: Self-pay | Admitting: Nurse Practitioner

## 2022-09-27 ENCOUNTER — Ambulatory Visit: Payer: Medicare Other | Attending: Cardiovascular Disease | Admitting: Nurse Practitioner

## 2022-09-27 DIAGNOSIS — M47816 Spondylosis without myelopathy or radiculopathy, lumbar region: Secondary | ICD-10-CM | POA: Diagnosis not present

## 2022-09-27 DIAGNOSIS — Z01810 Encounter for preprocedural cardiovascular examination: Secondary | ICD-10-CM

## 2022-09-27 DIAGNOSIS — M6281 Muscle weakness (generalized): Secondary | ICD-10-CM | POA: Diagnosis not present

## 2022-09-27 DIAGNOSIS — M47814 Spondylosis without myelopathy or radiculopathy, thoracic region: Secondary | ICD-10-CM | POA: Diagnosis not present

## 2022-10-02 DIAGNOSIS — M6281 Muscle weakness (generalized): Secondary | ICD-10-CM | POA: Diagnosis not present

## 2022-10-02 DIAGNOSIS — M47816 Spondylosis without myelopathy or radiculopathy, lumbar region: Secondary | ICD-10-CM | POA: Diagnosis not present

## 2022-10-02 DIAGNOSIS — M47814 Spondylosis without myelopathy or radiculopathy, thoracic region: Secondary | ICD-10-CM | POA: Diagnosis not present

## 2022-10-04 DIAGNOSIS — M47816 Spondylosis without myelopathy or radiculopathy, lumbar region: Secondary | ICD-10-CM | POA: Diagnosis not present

## 2022-10-04 DIAGNOSIS — M47814 Spondylosis without myelopathy or radiculopathy, thoracic region: Secondary | ICD-10-CM | POA: Diagnosis not present

## 2022-10-04 DIAGNOSIS — M6281 Muscle weakness (generalized): Secondary | ICD-10-CM | POA: Diagnosis not present

## 2022-10-08 DIAGNOSIS — Z86010 Personal history of colonic polyps: Secondary | ICD-10-CM | POA: Diagnosis not present

## 2022-10-08 DIAGNOSIS — K649 Unspecified hemorrhoids: Secondary | ICD-10-CM | POA: Diagnosis not present

## 2022-10-08 DIAGNOSIS — K921 Melena: Secondary | ICD-10-CM | POA: Diagnosis not present

## 2022-10-08 DIAGNOSIS — D175 Benign lipomatous neoplasm of intra-abdominal organs: Secondary | ICD-10-CM | POA: Diagnosis not present

## 2022-10-08 DIAGNOSIS — Z09 Encounter for follow-up examination after completed treatment for conditions other than malignant neoplasm: Secondary | ICD-10-CM | POA: Diagnosis not present

## 2022-10-08 DIAGNOSIS — K5730 Diverticulosis of large intestine without perforation or abscess without bleeding: Secondary | ICD-10-CM | POA: Diagnosis not present

## 2022-10-10 ENCOUNTER — Encounter: Payer: Self-pay | Admitting: Cardiovascular Disease

## 2022-10-10 ENCOUNTER — Telehealth: Payer: Self-pay | Admitting: Cardiovascular Disease

## 2022-10-10 NOTE — Telephone Encounter (Signed)
Report through [facility]  cardiology [facility]. She was in a study. The study had ended. Just received a message because she had been in one at time.  Over 2000 people were sent a message, some by mistake. She is not currently in any study.  My chart message sent to patinet to inform her of the mistake.

## 2022-10-10 NOTE — Telephone Encounter (Signed)
Caller wants call back directly from [employer].

## 2022-10-11 DIAGNOSIS — M47816 Spondylosis without myelopathy or radiculopathy, lumbar region: Secondary | ICD-10-CM | POA: Diagnosis not present

## 2022-10-11 DIAGNOSIS — M6281 Muscle weakness (generalized): Secondary | ICD-10-CM | POA: Diagnosis not present

## 2022-10-11 DIAGNOSIS — M47814 Spondylosis without myelopathy or radiculopathy, thoracic region: Secondary | ICD-10-CM | POA: Diagnosis not present

## 2022-10-16 DIAGNOSIS — M542 Cervicalgia: Secondary | ICD-10-CM | POA: Diagnosis not present

## 2022-10-22 DIAGNOSIS — M47814 Spondylosis without myelopathy or radiculopathy, thoracic region: Secondary | ICD-10-CM | POA: Diagnosis not present

## 2022-10-22 DIAGNOSIS — M6281 Muscle weakness (generalized): Secondary | ICD-10-CM | POA: Diagnosis not present

## 2022-10-22 DIAGNOSIS — M47816 Spondylosis without myelopathy or radiculopathy, lumbar region: Secondary | ICD-10-CM | POA: Diagnosis not present

## 2022-10-26 ENCOUNTER — Encounter: Payer: Self-pay | Admitting: Cardiovascular Disease

## 2022-11-01 DIAGNOSIS — M47816 Spondylosis without myelopathy or radiculopathy, lumbar region: Secondary | ICD-10-CM | POA: Diagnosis not present

## 2022-11-01 DIAGNOSIS — M6281 Muscle weakness (generalized): Secondary | ICD-10-CM | POA: Diagnosis not present

## 2022-11-01 DIAGNOSIS — M47814 Spondylosis without myelopathy or radiculopathy, thoracic region: Secondary | ICD-10-CM | POA: Diagnosis not present

## 2022-11-05 ENCOUNTER — Encounter: Payer: Self-pay | Admitting: Cardiovascular Disease

## 2022-11-05 ENCOUNTER — Other Ambulatory Visit: Payer: Self-pay | Admitting: Cardiovascular Disease

## 2022-11-05 DIAGNOSIS — I480 Paroxysmal atrial fibrillation: Secondary | ICD-10-CM

## 2022-11-06 MED ORDER — APIXABAN 5 MG PO TABS: 5.0000 mg | ORAL_TABLET | Freq: Two times a day (BID) | ORAL | 1 refills | Status: DC

## 2022-11-06 NOTE — Telephone Encounter (Signed)
Prescription refill request for Eliquis received. Indication: a fib Last office visit: 09/27/22 Scr: 1.33 09/11/22 epic Age: 77 Weight: 55kg

## 2022-11-19 ENCOUNTER — Encounter: Payer: Self-pay | Admitting: Cardiology

## 2022-11-19 ENCOUNTER — Ambulatory Visit: Payer: Medicare Other | Attending: Cardiology | Admitting: Cardiology

## 2022-11-19 VITALS — BP 140/62 | HR 51 | Ht 59.0 in | Wt 121.0 lb

## 2022-11-19 DIAGNOSIS — I480 Paroxysmal atrial fibrillation: Secondary | ICD-10-CM | POA: Insufficient documentation

## 2022-11-19 DIAGNOSIS — I10 Essential (primary) hypertension: Secondary | ICD-10-CM | POA: Insufficient documentation

## 2022-11-19 DIAGNOSIS — D6869 Other thrombophilia: Secondary | ICD-10-CM | POA: Insufficient documentation

## 2022-11-19 DIAGNOSIS — I2510 Atherosclerotic heart disease of native coronary artery without angina pectoris: Secondary | ICD-10-CM | POA: Insufficient documentation

## 2022-11-19 DIAGNOSIS — I493 Ventricular premature depolarization: Secondary | ICD-10-CM | POA: Insufficient documentation

## 2022-11-19 NOTE — Progress Notes (Signed)
Electrophysiology Office Note   Date:  11/19/2022   ID:  Brittany Kirk, DOB 29-Nov-1945, MRN [id number]  PCP:  Donita Brooks, MD  Cardiologist:  Allyson Sabal Primary Electrophysiologist:  Corleone Biegler [name], MD    Chief Complaint: AF   History of Present Illness: Brittany Kirk is a 77 y.o. female who is being seen today for the evaluation of AF at the request of Donita Brooks, MD. Presenting today for electrophysiology evaluation.  She has a history seen for coronary artery disease, atrial fibrillation, hypertension, hyperlipidemia, carotid artery disease, breast cancer postlumpectomy in 2015.  Left heart catheterization 2019 showed single-vessel disease in the ramus.  She is post atrial fibrillation ablation 04/14/2021.  She was found to have an elevated PVC burden and was started on amiodarone.  She did not tolerate mexiletine.  Amiodarone was stopped at the last visit due to fatigue.  Today, denies symptoms of palpitations, chest pain, shortness of breath, orthopnea, PND, lower extremity edema, claudication, dizziness, presyncope, syncope, bleeding, or neurologic sequela. The patient is tolerating medications without difficulties.  She is currently feeling well.  She notes no further palpitations.  Amiodarone was stopped at her last visit.  She feels better off of amiodarone.  She has no awareness of PVCs.    Past Medical History:  Diagnosis Date   Acid reflux    Allergic rhinitis    Anxiety    Asymptomatic bilateral carotid artery stenosis    40-59% (02/2018)   Atrial fibrillation (HCC) 07/2019   Breast cancer (HCC) 2015   Right Breast Cancer   CAD (coronary artery disease)    CKD (chronic kidney disease), stage III (HCC)    Depression    Emphysema    no longer uses inhalers   Gallstones    nausea, pain upper right abdomen   GERD (gastroesophageal reflux disease)    H/O hiatal hernia    Hiatal hernia    History of skin cancer    Hyperlipidemia    Hypertension     Multinodular goiter (nontoxic)    Scoliosis    Skin cancer    Tobacco user    Wears dentures    top   Past Surgical History:  Procedure Laterality Date   ABDOMINAL AORTOGRAM W/LOWER EXTREMITY N/A 02/05/2022   Procedure: ABDOMINAL AORTOGRAM W/LOWER EXTREMITY;  Surgeon: Runell Gess, MD;  Location: [facility] INVASIVE CV LAB;  Service: Cardiovascular;  Laterality: N/A;   ATRIAL FIBRILLATION ABLATION N/A 04/14/2021   Procedure: ATRIAL FIBRILLATION ABLATION;  Surgeon: Regan Lemming, MD;  Location: [facility] INVASIVE CV LAB;  Service: Cardiovascular;  Laterality: N/A;   BREAST LUMPECTOMY Right 2015   CHOLECYSTECTOMY  07/08/2012   Procedure: LAPAROSCOPIC CHOLECYSTECTOMY WITH INTRAOPERATIVE CHOLANGIOGRAM;  Surgeon: Atilano Ina, MD,FACS;  Location: [facility] ORS;  Service: General;  Laterality: N/A;  Laparoscopic Cholecystectomy with Intraoperative Cholangiogram   COLONOSCOPY     ESOPHAGOSCOPY N/A 05/29/2017   Procedure: ESOPHAGOSCOPY;  Surgeon: Vida Rigger, MD;  Location[facility] ENDOSCOPY;  Service: Endoscopy;  Laterality: N/A;  botox injection   HAND SURGERY Left    wrist   LEFT HEART CATH AND CORONARY ANGIOGRAPHY N/A 10/11/2017   Procedure: LEFT HEART CATH AND CORONARY ANGIOGRAPHY;  Surgeon: Swazil[place], Peter M, MD;  Location: [facility] INVASIVE CV LAB;  Service: Cardiovascular;  Laterality: N/A;   TONSILECTOMY, ADENOIDECTOMY, BILATERAL MYRINGOTOMY AND TUBES     TUBAL LIGATION       Current Outpatient Medications  Medication Sig Dispense Refill   acetaminophen (TYLENOL) 500  MG tablet Take 1,000 mg by mouth every 6 (six) hours as needed for moderate pain or headache.     apixaban (ELIQUIS) 5 MG TABS tablet Take 1 tablet (5 mg total) by mouth 2 (two) times daily. 180 tablet 1   ascorbic acid (VITAMIN C) 1000 MG tablet Take 1,000 mg by mouth daily.     atorvastatin (LIPITOR) 40 MG tablet TAKE 1 TABLET BY MOUTH EVERYDAY AT BEDTIME 90 tablet 0   Cholecalciferol (VITAMIN D) 125 MCG (5000 UT) CAPS Take 5,000 Units by  mouth daily.     diazepam (VALIUM) 5 MG tablet Take 5 mg by mouth as needed for sedation.     omeprazole (PRILOSEC) 20 MG capsule Take 20 mg by mouth daily.     valsartan (DIOVAN) 40 MG tablet Take 1 tablet (40 mg total) by mouth daily. Hold for Blood Pressure <120 90 tablet 3   No current facility-administered medications for this visit.    Allergies:   Penicillins, Fosamax [alendronate], Levofloxacin, Mexiletine, Trimethoprim, Cefaclor, Latex, Metronidazole, Naproxen, Nsaids, Septra [bactrim], Sulfonamide derivatives, Tape, and Wound dressing adhesive   Social History:  The patient  reports that she quit smoking about 11 years ago. Her smoking use included cigarettes. She has a 23.00 pack-year smoking history. She has never used smokeless tobacco. She reports that she does not drink alcohol and does not use drugs.   Family History:  The patient's family history includes Aplastic anemia in an other family member; Arthritis in her mother; Diverticulitis in her sister; Heart disease in her sister and another family member; Pancreatic cancer in her mother.   ROS:  Please see the history of present illness.   Otherwise, review of systems is positive for none.   All other systems are reviewed and negative.   PHYSICAL EXAM: VS:  BP (!) 140/62   Pulse (!) 51   Ht 4\' 11"  (1.499 m)   Wt 121 lb (54.9 kg)   BMI 24.44 kg/m  , BMI Body mass index is 24.44 kg/m. GEN: Well nourished, well developed, in no acute distress  HEENT: normal  Neck: no JVD, carotid bruits, or masses Cardiac: RRR; no murmurs, rubs, or gallops,no edema  Respiratory:  clear to auscultation bilaterally, normal work of breathing GI: soft, nontender, nondistended, + BS MS: no deformity or atrophy  Skin: warm and dry Neuro:  Strength and sensation are intact Psych: euthymic mood, full affect  EKG:  EKG is ordered today. Personal review of the ekg ordered shows sinus rhythm   Recent Labs: 04/20/2022: TSH 0.50 09/11/2022: ALT  9; BUN 30; Creat 1.33; Hemoglobin 12.2; Platelets 256; Potassium 4.3; Sodium 138    Lipid Panel     Component Value Date/Time   CHOL 129 09/11/2022 1429   CHOL 152 02/28/2022 1108   TRIG 63 09/11/2022 1429   HDL 63 09/11/2022 1429   HDL 71 02/28/2022 1108   CHOLHDL 2.0 09/11/2022 1429   VLDL 10 07/15/2019 0934   LDLCALC 52 09/11/2022 1429     Wt Readings from Last 3 Encounters:  11/19/22 121 lb (54.9 kg)  09/11/22 122 lb (55.3 kg)  08/23/22 123 lb 0.3 oz (55.8 kg)      Other studies Reviewed: Additional studies/ records that were reviewed today include: Cardiac monitor 05/19/2021 personally reviewed Review of the above records today demonstrates:  1. SR/SB/ST 2. Freq PVCs often in a Bi/Tri geminal pattern  TTE 05/17/2021 personally reviewed  1. Left ventricular ejection fraction, by estimation, is 55 to  60%. The  left ventricle has normal function. The left ventricle has no regional  wall motion abnormalities. There is mild asymmetric left ventricular  hypertrophy of the basal-septal segment.  Left ventricular diastolic parameters were normal.   2. Right ventricular systolic function is normal. The right ventricular  size is normal. There is mildly elevated pulmonary artery systolic  pressure. The estimated right ventricular systolic pressure is 37.6 mmHg.   3. The mitral valve is normal in structure. Mild mitral valve  regurgitation.   4. The aortic valve is tricuspid. There is mild calcification of the  aortic valve. There is mild thickening of the aortic valve. Aortic valve  regurgitation is trivial.   5. The inferior vena cava is normal in size with greater than 50%  respiratory variability, suggesting right atrial pressure of 3 mmHg.   ASSESSMENT AND PLAN:  1.  Paroxysmal atrial fibrillation: Currently on Eliquis.  CHA2DS2-VASc of 5.  Status post ablation 04/14/2021.  Remains in sinus rhythm.  2.  Coronary artery disease: Disease in the ramus branch.  Continue  with medical management per primary cardiology.  3.  Hypertension: Elevated today.  Usually well-controlled.  No changes.  4.  PVCs: Amiodarone was stopped at her last visit.  She has noted no further PVCs.  Happy with her control.  No changes.  5.  Secondary hypercoagulable state: Currently on Eliquis for atrial fibrillation   Current medicines are reviewed at length with the patient today.   The patient does not have concerns regarding her medicines.  The following changes were made today: None  Labs/ tests ordered today include:  Orders Placed This Encounter  Procedures   EKG 12-Lead     Disposition:   FU 12 months  Signed, Lakiah Dhingra [name], MD  11/19/2022 12:10 PM     [facility] [address] [place] [address] [phone] (office) [phone] (fax)

## 2022-12-03 ENCOUNTER — Other Ambulatory Visit: Payer: Self-pay | Admitting: Family Medicine

## 2022-12-17 ENCOUNTER — Other Ambulatory Visit: Payer: Self-pay | Admitting: Obstetrics and Gynecology

## 2022-12-17 ENCOUNTER — Encounter: Payer: Self-pay | Admitting: Family Medicine

## 2022-12-17 DIAGNOSIS — Z853 Personal history of malignant neoplasm of breast: Secondary | ICD-10-CM | POA: Diagnosis not present

## 2022-12-17 DIAGNOSIS — Z124 Encounter for screening for malignant neoplasm of cervix: Secondary | ICD-10-CM | POA: Diagnosis not present

## 2022-12-17 DIAGNOSIS — N6310 Unspecified lump in the right breast, unspecified quadrant: Secondary | ICD-10-CM

## 2022-12-17 DIAGNOSIS — Z6824 Body mass index (BMI) 24.0-24.9, adult: Secondary | ICD-10-CM | POA: Diagnosis not present

## 2022-12-21 ENCOUNTER — Ambulatory Visit: Admission: RE | Admit: 2022-12-21 | Payer: Medicare Other | Source: Ambulatory Visit

## 2022-12-21 ENCOUNTER — Ambulatory Visit
Admission: RE | Admit: 2022-12-21 | Discharge: 2022-12-21 | Disposition: A | Discharge status: Home / Self Care | Payer: Medicare Other | Source: Ambulatory Visit | Attending: Obstetrics and Gynecology | Admitting: Obstetrics and Gynecology

## 2022-12-21 DIAGNOSIS — N6310 Unspecified lump in the right breast, unspecified quadrant: Secondary | ICD-10-CM

## 2022-12-21 DIAGNOSIS — N6311 Unspecified lump in the right breast, upper outer quadrant: Secondary | ICD-10-CM | POA: Diagnosis not present

## 2023-01-01 DIAGNOSIS — L570 Actinic keratosis: Secondary | ICD-10-CM | POA: Diagnosis not present

## 2023-01-01 DIAGNOSIS — D1801 Hemangioma of skin and subcutaneous tissue: Secondary | ICD-10-CM | POA: Diagnosis not present

## 2023-01-01 DIAGNOSIS — L821 Other seborrheic keratosis: Secondary | ICD-10-CM | POA: Diagnosis not present

## 2023-01-01 DIAGNOSIS — Z85828 Personal history of other malignant neoplasm of skin: Secondary | ICD-10-CM | POA: Diagnosis not present

## 2023-01-30 ENCOUNTER — Encounter: Payer: Self-pay | Admitting: Cardiovascular Disease

## 2023-02-19 DIAGNOSIS — C4441 Basal cell carcinoma of skin of scalp and neck: Secondary | ICD-10-CM | POA: Diagnosis not present

## 2023-02-19 DIAGNOSIS — D692 Other nonthrombocytopenic purpura: Secondary | ICD-10-CM | POA: Diagnosis not present

## 2023-02-19 DIAGNOSIS — L570 Actinic keratosis: Secondary | ICD-10-CM | POA: Diagnosis not present

## 2023-02-19 DIAGNOSIS — D225 Melanocytic nevi of trunk: Secondary | ICD-10-CM | POA: Diagnosis not present

## 2023-02-19 DIAGNOSIS — Z85828 Personal history of other malignant neoplasm of skin: Secondary | ICD-10-CM | POA: Diagnosis not present

## 2023-02-19 DIAGNOSIS — L723 Sebaceous cyst: Secondary | ICD-10-CM | POA: Diagnosis not present

## 2023-02-19 DIAGNOSIS — L821 Other seborrheic keratosis: Secondary | ICD-10-CM | POA: Diagnosis not present

## 2023-02-19 DIAGNOSIS — D1801 Hemangioma of skin and subcutaneous tissue: Secondary | ICD-10-CM | POA: Diagnosis not present

## 2023-02-19 DIAGNOSIS — L814 Other melanin hyperpigmentation: Secondary | ICD-10-CM | POA: Diagnosis not present

## 2023-02-19 DIAGNOSIS — C44519 Basal cell carcinoma of skin of other part of trunk: Secondary | ICD-10-CM | POA: Diagnosis not present

## 2023-03-08 ENCOUNTER — Encounter: Payer: Self-pay | Admitting: Cardiovascular Disease

## 2023-03-08 ENCOUNTER — Ambulatory Visit (INDEPENDENT_AMBULATORY_CARE_PROVIDER_SITE_OTHER): Payer: Medicare Other | Admitting: Family Medicine

## 2023-03-08 ENCOUNTER — Encounter: Payer: Self-pay | Admitting: Family Medicine

## 2023-03-08 VITALS — BP 116/62 | HR 55 | Temp 98.0°F | Ht 59.0 in | Wt 126.0 lb

## 2023-03-08 DIAGNOSIS — J329 Chronic sinusitis, unspecified: Secondary | ICD-10-CM

## 2023-03-08 MED ORDER — AZITHROMYCIN 250 MG PO TABS: ORAL_TABLET | ORAL | 0 refills | Status: DC

## 2023-03-08 NOTE — Progress Notes (Signed)
Subjective:    Patient ID: Brittany Kirk, female    DOB: [birth year], 77 y.o.   MRN: [id number]  Patient reports a 1 week history of pain and pressure in her right ear.  She also reports sinus pressure and sinus congestion.  She reports sinus drainage postnasal drip causing sore throat.  She denies fever.  She denies chills.  She denies cough.  She denies chest pain.  She denies shortness of breath.  Past Medical History:  Diagnosis Date   Acid reflux    Allergic rhinitis    Anxiety    Asymptomatic bilateral carotid artery stenosis    40-59% (02/2018)   Atrial fibrillation (HCC) 07/2019   Breast cancer (HCC) 2015   Right Breast Cancer   CAD (coronary artery disease)    CKD (chronic kidney disease), stage III (HCC)    Depression    Emphysema    no longer uses inhalers   Gallstones    nausea, pain upper right abdomen   GERD (gastroesophageal reflux disease)    H/O hiatal hernia    Hiatal hernia    History of skin cancer    Hyperlipidemia    Hypertension    Multinodular goiter (nontoxic)    Scoliosis    Skin cancer    Tobacco user    Wears dentures    top   Past Surgical History:  Procedure Laterality Date   ABDOMINAL AORTOGRAM W/LOWER EXTREMITY N/A 02/05/2022   Procedure: ABDOMINAL AORTOGRAM W/LOWER EXTREMITY;  Surgeon: Runell Gess, MD;  Location: [facility];  Service: Cardiovascular;  Laterality: N/A;   ATRIAL FIBRILLATION ABLATION N/A 04/14/2021   Procedure: ATRIAL FIBRILLATION ABLATION;  Surgeon: Regan Lemming, MD;  Location: [facility];  Service: Cardiovascular;  Laterality: N/A;   BREAST LUMPECTOMY Right 2015   CHOLECYSTECTOMY  07/08/2012   Procedure: LAPAROSCOPIC CHOLECYSTECTOMY WITH INTRAOPERATIVE CHOLANGIOGRAM;  Surgeon: Atilano Ina, MD,FACS;  Location: [facility];  Service: General;  Laterality: N/A;  Laparoscopic Cholecystectomy with Intraoperative Cholangiogram   COLONOSCOPY     ESOPHAGOSCOPY N/A 05/29/2017   Procedure: ESOPHAGOSCOPY;   Surgeon: Vida Rigger, MD;  Location: [facility] ENDOSCOPY;  Service: Endoscopy;  Laterality: N/A;  botox injection   HAND SURGERY Left    wrist   LEFT HEART CATH AND CORONARY ANGIOGRAPHY N/A 10/11/2017   Procedure: LEFT HEART CATH AND CORONARY ANGIOGRAPHY;  Surgeon: Swaziland, Peter M, MD;  Location: [facility] INVASIVE CV LAB;  Service: Cardiovascular;  Laterality: N/A;   TONSILECTOMY, ADENOIDECTOMY, BILATERAL MYRINGOTOMY AND TUBES     TUBAL LIGATION     Current Outpatient Medications on File Prior to Visit  Medication Sig Dispense Refill   acetaminophen (TYLENOL) 500 MG tablet Take 1,000 mg by mouth every 6 (six) hours as needed for moderate pain or headache.     apixaban (ELIQUIS) 5 MG TABS tablet Take 1 tablet (5 mg total) by mouth 2 (two) times daily. 180 tablet 1   ascorbic acid (VITAMIN C) 1000 MG tablet Take 1,000 mg by mouth daily.     atorvastatin (LIPITOR) 40 MG tablet TAKE 1 TABLET BY MOUTH EVERYDAY AT BEDTIME 90 tablet 1   Cholecalciferol (VITAMIN D) 125 MCG (5000 UT) CAPS Take 5,000 Units by mouth daily.     diazepam (VALIUM) 5 MG tablet Take 5 mg by mouth as needed for sedation.     omeprazole (PRILOSEC) 20 MG capsule Take 20 mg by mouth daily.     valsartan (DIOVAN) 40 MG tablet Take 1 tablet (  40 mg total) by mouth daily. Hold for Blood Pressure <120 90 tablet 3   No current facility-administered medications on file prior to visit.   Allergies  Allergen Reactions   Penicillins Anaphylaxis, Itching, Swelling and Rash    Has patient had a PCN reaction causing immediate rash, facial/tongue/throat swelling, SOB or lightheadedness with hypotension: Yes Has patient had a PCN reaction causing severe rash involving mucus membranes or skin necrosis: No Has patient had a PCN reaction that required hospitalization: Yes Has patient had a PCN reaction occurring within the last 10 years: No If all of the above answers are "NO", then may proceed with Cephalosporin use.    Fosamax [Alendronate]      myalgias   Levofloxacin Nausea Only and Other (See Comments)    Causes headache, fatigue and dizziness   Mexiletine Itching, Nausea Only and Other (See Comments)   Trimethoprim    Cefaclor Itching, Swelling and Rash   Latex Itching and Rash   Metronidazole Itching, Swelling and Rash   Naproxen Itching, Swelling and Rash   Nsaids Itching, Swelling and Rash    Ibuprofen IS tolerated   Septra [Bactrim] Itching, Swelling and Rash   Sulfonamide Derivatives Itching, Swelling and Rash   Tape Itching and Rash    ONLY USE PAPER TAPE   Wound Dressing Adhesive Itching and Rash   Social History   Socioeconomic History   Marital status: Married    Spouse name: Actor   Number of children: 1   Years of education: Not on file   Highest education level: Some college, no degree  Occupational History   Occupation: retired Database administrator: RETIRED  Tobacco Use   Smoking status: Former    Current packs/day: 0.00    Average packs/day: 0.5 packs/day for 46.0 years (23.0 ttl pk-yrs)    Types: Cigarettes    Start date: 02/12/1965    Quit date: 02/13/2011    Years since quitting: 12.0   Smokeless tobacco: Never  Vaping Use   Vaping status: Never Used  Substance and Sexual Activity   Alcohol use: No   Drug use: No   Sexual activity: Not on file  Other Topics Concern   Not on file  Social History Narrative   Married since 1991, second marriage.    1 son   1 grandson   1 granddaughter   2 step children   3 step grandchildren   Social Determinants of Health   Financial Resource Strain: Low Risk  (09/09/2022)   Overall Financial Resource Strain (CARDIA)    Difficulty of Paying Living Expenses: Not hard at all  Food Insecurity: No Food Insecurity (09/09/2022)   Hunger Vital Sign    Worried About Running Out of Food in the Last Year: Never true    Ran Out of Food in the Last Year: Never true  Transportation Needs: No Transportation Needs (09/09/2022)   PRAPARE - Therapist, art (Medical): No    Lack of Transportation (Non-Medical): No  Physical Activity: Insufficiently Active (07/12/2022)   Exercise Vital Sign    Days of Exercise per Week: 3 days    Minutes of Exercise per Session: 20 min  Stress: Stress Concern Present (09/09/2022)   [employer] of Occupational Health - Occupational Stress Questionnaire    Feeling of Stress : To some extent  Social Connections: Socially Integrated (09/09/2022)   Social Connection and Isolation Panel [NHANES]    Frequency of Communication with  Friends and Family: Twice a week    Frequency of Social Gatherings with Friends and Family: Once a week    Attends Religious Services: More than 4 times per year    Active Member of [employer] or Organizations: No    Attends Engineer, structural: More than 4 times per year    Marital Status: Married  Catering manager Violence: Not At Risk (07/12/2022)   Humiliation, Afraid, Rape, and Kick questionnaire    Fear of Current or Ex-Partner: No    Emotionally Abused: No    Physically Abused: No    Sexually Abused: No     Review of Systems  All other systems reviewed and are negative.      Objective:   Physical Exam Vitals reviewed.  Constitutional:      General: She is not in acute distress.    Appearance: She is well-developed. She is not diaphoretic.  HENT:     Head: Normocephalic and atraumatic.     Right Ear: External ear normal. Drainage present. There is impacted cerumen.     Left Ear: Tympanic membrane, ear canal and external ear normal. Decreased hearing noted. Drainage and swelling present.     Nose: Congestion and rhinorrhea present.     Right Sinus: Maxillary sinus tenderness and frontal sinus tenderness present.     Mouth/Throat:     Pharynx: No oropharyngeal exudate or posterior oropharyngeal erythema.  Eyes:     Conjunctiva/sclera: Conjunctivae normal.     Pupils: Pupils are equal, round, and reactive to light.  Neck:     Thyroid:  No thyromegaly.     Vascular: No JVD.  Cardiovascular:     Rate and Rhythm: Normal rate and regular rhythm.     Pulses:          Dorsalis pedis pulses are 1+ on the right side and 1+ on the left side.       Posterior tibial pulses are 1+ on the right side and 1+ on the left side.     Heart sounds: Normal heart sounds. No murmur heard.    No friction rub. No gallop.  Pulmonary:     Effort: Pulmonary effort is normal. No respiratory distress.     Breath sounds: Normal breath sounds. No stridor. No wheezing or rales.  Musculoskeletal:     Cervical back: Normal range of motion.     Lumbar back: Spasms, tenderness and bony tenderness present. Decreased range of motion. Negative right straight leg raise test and negative left straight leg raise test.     Right lower leg: No edema.     Left lower leg: No edema.  Lymphadenopathy:     Cervical: No cervical adenopathy.  Neurological:     Mental Status: She is alert and oriented to person, place, and time.     Cranial Nerves: No cranial nerve deficit.     Sensory: No sensory deficit.     Motor: Tremor present. No atrophy, abnormal muscle tone or seizure activity.     Deep Tendon Reflexes: Reflexes are normal and symmetric.           Assessment & Plan:  Rhinosinusitis Given her history of heart ablation and cardiac arrhythmia, we want to avoid any decongestants.  Therefore I will start the patient on Flonase 2 sprays each nostril daily along with Xyzal 5 mg daily.  I believe that this will help with eustachian tube dysfunction which I believe is the source of her right ear pain and  also help with the sinus pressure.  If not improving she can start a Z-Pak for secondary sinusitis.  Symptoms been present for 7 days.  I will try the Flonase and Xyzal for 2 to 3 days and add a Z-Pak if not improving at that point.

## 2023-03-27 ENCOUNTER — Ambulatory Visit: Payer: Medicare Other | Attending: Cardiovascular Disease | Admitting: Cardiovascular Disease

## 2023-03-27 ENCOUNTER — Encounter: Payer: Self-pay | Admitting: Cardiovascular Disease

## 2023-03-27 VITALS — BP 128/74 | HR 58 | Ht 59.0 in | Wt 126.6 lb

## 2023-03-27 DIAGNOSIS — E782 Mixed hyperlipidemia: Secondary | ICD-10-CM | POA: Diagnosis not present

## 2023-03-27 DIAGNOSIS — I10 Essential (primary) hypertension: Secondary | ICD-10-CM

## 2023-03-27 DIAGNOSIS — I2510 Atherosclerotic heart disease of native coronary artery without angina pectoris: Secondary | ICD-10-CM

## 2023-03-27 DIAGNOSIS — I6523 Occlusion and stenosis of bilateral carotid arteries: Secondary | ICD-10-CM

## 2023-03-27 NOTE — Assessment & Plan Note (Signed)
History of hyperlipidemia on statin therapy with lipid profile performed/2/24 revealing total cholesterol 129, LDL 52 and HDL 63.

## 2023-03-27 NOTE — Patient Instructions (Signed)
Medication Instructions:  Your physician recommends that you continue on your current medications as directed. Please refer to the Current Medication list given to you today.  *If you need a refill on your cardiac medications before your next appointment, please call your pharmacy*    Testing/Procedures: Your physician has requested that you have a carotid duplex. This test is an ultrasound of the carotid arteries in your neck. It looks at blood flow through these arteries that supply the brain with blood. Allow one hour for this exam. There are no restrictions or special instructions. This will take place at [address], [address].    Follow-Up: At [facility], you and your health needs are our priority.  As part of our continuing mission to provide you with exceptional heart care, we have created designated Provider Care Teams.  These Care Teams include your primary Cardiologist (physician) and Advanced Practice Providers (APPs -  Physician Assistants and Nurse Practitioners) who all work together to provide you with the care you need, when you need it.  We recommend signing up for the patient portal called "MyChart".  Sign up information is provided on this After Visit Summary.  MyChart is used to connect with patients for Virtual Visits (Telemedicine).  Patients are able to view lab/test results, encounter notes, upcoming appointments, etc.  Non-urgent messages can be sent to your provider as well.   To learn more about what you can do with MyChart, go to [email or web address].    Your next appointment:   12 month(s)  Provider:   Nanetta Batty, MD

## 2023-03-27 NOTE — Assessment & Plan Note (Signed)
History of essential hypertension blood pressure measured today at 128/74.  She is on valsartan.  She does complain of some orthostasis and I told her to change positions slowly and to maintain attain hydration.

## 2023-03-27 NOTE — Assessment & Plan Note (Signed)
History of CAD status post catheterization by Dr. Swaziland 5/19 showing moderately severe ramus branch ostial disease and noncritical disease otherwise unchanged from her cath in 2010.  She denies chest pain.

## 2023-03-27 NOTE — Assessment & Plan Note (Signed)
History of moderate bilateral ICA stenosis by duplex ultrasound last checked 03/01/2022.  This will be repeated.  She does have bilateral carotid bruits.

## 2023-03-27 NOTE — Progress Notes (Signed)
03/27/2023 Brittany Kirk   09-04-1945  [id number]  Primary Physician Pickard, Priscille Heidelberg, MD Primary Cardiologist: Runell Gess MD Nicholes Calamity, [place]  HPI:  Brittany Kirk is a 77 y.o.    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 She was referred by Dr. Tanya Nones for cardiovascular evaluation because of chest pain. I last saw her in the office 02/28/2022.  Risk factors include 25 pack years of tobacco abuse having quit 3 years ago. She has treated hypertension as well as a strong family history of heart disease with multiple siblings who died at a premature age of myocardial infarction's. She has never had a heart attack or stroke. She has had a remote cardiac catheterization by Dr. Riley Kill but no intervention was performed. She was complaining of some substernal chest pain with chronic nausea. This has since some sided after beginning on a proton pump inhibitor. She had a negative stress test 01/24/16 and again during a recent consultation for chest pain 01/05/17. She has had upper and lower endoscopy for abdominal pain and nausea which was unrevealing.   She had diagnostic coronary angiography performed by Dr. Swaziland 5/19 showing moderately severe ramus branch ostial disease with noncritical disease otherwise a normal LV function.  This was unchanged from prior cath performed in 2010 it was thought that her chest pain was noncardiac in nature.   She was seen in the emergency room on 07/15/2019 for A. fib with RVR.  She converted spontaneously to sinus rhythm and was begun on apixaban by Dr. Cristal Deer.  She has had a few episodes of brief AF since but for the most part is asymptomatic from this.   She was admitted in early August with A. fib with RVR and converted.  Her troponins were mildly elevated.  Her 2D echo was normal.  She has had no recurrent episodes.  She she saw  Dr. Elberta Fortis for EP follow-up who had an A. fib ablation on 04/14/2021.  She saw Dr. Elberta Fortis because of symptomatic PVCs who began her on mexiletine.  Unfortunately, this is exacerbated her baseline tremor.  Otherwise she has remained stable.  She also complains of reflux type symptoms on Prilosec.  She still sees Dr. Leary Roca  a gastroenterologist for this.  Because of her intolerance to mexiletine she saw Dr. Elberta Fortis again who began amiodarone.  He loaded her for 3 weeks and began her on 200 mg a day.  Since that time she has been symptomatically bradycardic.  She did see Azalee Course PA-C in the office 11/23/2021 complaining of dependent cyanosis and weakness in her legs.  Doppler studies performed 12/07/2021 revealed right ABI of 0.78 and left of 0.75 with high-frequency signals of both iliac arteries.   I performed peripheral angiography on her 02/05/2022 revealing 50% segmental in for renal abdominal aortic narrowing, 40% ostial right common iliac artery stenosis with a 30 mm pullback gradient across her aorta and iliac.  I do not think her iliac appears angiographically significant.  All her infrainguinal vasculature was widely patent.  I have no explanation for her dependent cyanosis.  Since I saw her a year ago she is remained stable.  Her major complaints are of orthostasis with dizziness when she changes positions.  She also complains of some numbness in both of her legs which probably are neurogenic.  She specifically denies chest pain or shortness  of breath.   Current Meds  Medication Sig   acetaminophen (TYLENOL) 500 MG tablet Take 1,000 mg by mouth every 6 (six) hours as needed for moderate pain or headache.   apixaban (ELIQUIS) 5 MG TABS tablet Take 1 tablet (5 mg total) by mouth 2 (two) times daily.   ascorbic acid (VITAMIN C) 1000 MG tablet Take 1,000 mg by mouth daily.   atorvastatin (LIPITOR) 40 MG tablet TAKE 1 TABLET BY MOUTH EVERYDAY AT BEDTIME   Cholecalciferol (VITAMIN D) 125 MCG  (5000 UT) CAPS Take 5,000 Units by mouth daily.   diazepam (VALIUM) 5 MG tablet Take 5 mg by mouth as needed for sedation.   omeprazole (PRILOSEC) 20 MG capsule Take 20 mg by mouth daily.   valsartan (DIOVAN) 40 MG tablet Take 1 tablet (40 mg total) by mouth daily. Hold for Blood Pressure <120     Allergies  Allergen Reactions   Penicillins Anaphylaxis, Itching, Swelling and Rash    Has patient had a PCN reaction causing immediate rash, facial/tongue/throat swelling, SOB or lightheadedness with hypotension: Yes Has patient had a PCN reaction causing severe rash involving mucus membranes or skin necrosis: No Has patient had a PCN reaction that required hospitalization: Yes Has patient had a PCN reaction occurring within the last 10 years: No If all of the above answers are "NO", then may proceed with Cephalosporin use.    Fosamax [Alendronate]     myalgias   Levofloxacin Nausea Only and Other (See Comments)    Causes headache, fatigue and dizziness   Mexiletine Itching, Nausea Only and Other (See Comments)   Trimethoprim    Cefaclor Itching, Swelling and Rash   Latex Itching and Rash   Metronidazole Itching, Swelling and Rash   Naproxen Itching, Swelling and Rash   Nsaids Itching, Swelling and Rash    Ibuprofen IS tolerated   Septra [Bactrim] Itching, Swelling and Rash   Sulfonamide Derivatives Itching, Swelling and Rash   Tape Itching and Rash    ONLY USE PAPER TAPE   Wound Dressing Adhesive Itching and Rash    Social History   Socioeconomic History   Marital status: Married    Spouse name: Actor   Number of children: 1   Years of education: Not on file   Highest education level: Some college, no degree  Occupational History   Occupation: retired Database administrator: RETIRED  Tobacco Use   Smoking status: Former    Current packs/day: 0.00    Average packs/day: 0.5 packs/day for 46.0 years (23.0 ttl pk-yrs)    Types: Cigarettes    Start date: 02/12/1965     Quit date: 02/13/2011    Years since quitting: 12.1   Smokeless tobacco: Never  Vaping Use   Vaping status: Never Used  Substance and Sexual Activity   Alcohol use: No   Drug use: No   Sexual activity: Not on file  Other Topics Concern   Not on file  Social History Narrative   Married since 1991, second marriage.    1 son   1 grandson   1 granddaughter   2 step children   3 step grandchildren   Social Determinants of Health   Financial Resource Strain: Low Risk  (09/09/2022)   Overall Financial Resource Strain (CARDIA)    Difficulty of Paying Living Expenses: Not hard at all  Food Insecurity: No Food Insecurity (09/09/2022)   Hunger Vital Sign    Worried About Running Out of Food  in the Last Year: Never true    Ran Out of Food in the Last Year: Never true  Transportation Needs: No Transportation Needs (09/09/2022)   PRAPARE - Administrator, Civil Service (Medical): No    Lack of Transportation (Non-Medical): No  Physical Activity: Insufficiently Active (07/12/2022)   Exercise Vital Sign    Days of Exercise per Week: 3 days    Minutes of Exercise per Session: 20 min  Stress: Stress Concern Present (09/09/2022)   [employer] of Occupational Health - Occupational Stress Questionnaire    Feeling of Stress : To some extent  Social Connections: Socially Integrated (09/09/2022)   Social Connection and Isolation Panel [NHANES]    Frequency of Communication with Friends and Family: Twice a week    Frequency of Social Gatherings with Friends and Family: Once a week    Attends Religious Services: More than 4 times per year    Active Member of [employer] or Organizations: No    Attends Engineer, structural: More than 4 times per year    Marital Status: Married  Catering manager Violence: Not At Risk (07/12/2022)   Humiliation, Afraid, Rape, and Kick questionnaire    Fear of Current or Ex-Partner: No    Emotionally Abused: No    Physically Abused: No    Sexually  Abused: No     Review of Systems: General: negative for chills, fever, night sweats or weight changes.  Cardiovascular: negative for chest pain, dyspnea on exertion, edema, orthopnea, palpitations, paroxysmal nocturnal dyspnea or shortness of breath Dermatological: negative for rash Respiratory: negative for cough or wheezing Urologic: negative for hematuria Abdominal: negative for nausea, vomiting, diarrhea, bright red blood per rectum, melena, or hematemesis Neurologic: negative for visual changes, syncope, or dizziness All other systems reviewed and are otherwise negative except as noted above.    Blood pressure 128/74, pulse (!) 58, height 4\' 11"  (1.499 m), weight 126 lb 9.6 oz (57.4 kg).  General appearance: alert and no distress Neck: no adenopathy, no carotid bruit, no JVD, supple, symmetrical, trachea midline, and thyroid not enlarged, symmetric, no tenderness/mass/nodules Lungs: clear to auscultation bilaterally Heart: regular rate and rhythm, S1, S2 normal, no murmur, click, rub or gallop Extremities: extremities normal, atraumatic, no cyanosis or edema Pulses: 2+ and symmetric Skin: Skin color, texture, turgor normal. No rashes or lesions Neurologic: Grossly normal  EKG not performed today      ASSESSMENT AND PLAN:   CAD in native artery History of CAD status post catheterization by Dr. Swaziland 5/19 showing moderately severe ramus branch ostial disease and noncritical disease otherwise unchanged from her cath in 2010.  She denies chest pain.  Hyperlipidemia History of hyperlipidemia on statin therapy with lipid profile performed/2/24 revealing total cholesterol 129, LDL 52 and HDL 63.  Essential hypertension History of essential hypertension blood pressure measured today at 128/74.  She is on valsartan.  She does complain of some orthostasis and I told her to change positions slowly and to maintain attain hydration.  Bilateral carotid artery disease (HCC) History of  moderate bilateral ICA stenosis by duplex ultrasound last checked 03/01/2022.  This will be repeated.  She does have bilateral carotid bruits.     Runell Gess MD FACP,FACC,FAHA, [facility] 03/27/2023 3:23 PM

## 2023-03-28 ENCOUNTER — Encounter: Payer: Self-pay | Admitting: Cardiovascular Disease

## 2023-04-01 DIAGNOSIS — Z85828 Personal history of other malignant neoplasm of skin: Secondary | ICD-10-CM | POA: Diagnosis not present

## 2023-04-01 DIAGNOSIS — C4442 Squamous cell carcinoma of skin of scalp and neck: Secondary | ICD-10-CM | POA: Diagnosis not present

## 2023-04-08 ENCOUNTER — Ambulatory Visit (HOSPITAL_COMMUNITY)
Admission: RE | Admit: 2023-04-08 | Discharge: 2023-04-08 | Disposition: A | Discharge status: Home / Self Care | Payer: Medicare Other | Source: Ambulatory Visit | Attending: Cardiovascular Disease | Admitting: Cardiovascular Disease

## 2023-04-08 DIAGNOSIS — E782 Mixed hyperlipidemia: Secondary | ICD-10-CM | POA: Diagnosis not present

## 2023-04-08 DIAGNOSIS — I6523 Occlusion and stenosis of bilateral carotid arteries: Secondary | ICD-10-CM | POA: Insufficient documentation

## 2023-04-08 DIAGNOSIS — I2510 Atherosclerotic heart disease of native coronary artery without angina pectoris: Secondary | ICD-10-CM | POA: Diagnosis not present

## 2023-04-10 ENCOUNTER — Encounter: Payer: Self-pay | Admitting: Cardiovascular Disease

## 2023-04-11 ENCOUNTER — Other Ambulatory Visit (HOSPITAL_COMMUNITY): Payer: Self-pay

## 2023-04-11 DIAGNOSIS — I6523 Occlusion and stenosis of bilateral carotid arteries: Secondary | ICD-10-CM

## 2023-05-27 DIAGNOSIS — L821 Other seborrheic keratosis: Secondary | ICD-10-CM | POA: Diagnosis not present

## 2023-05-27 DIAGNOSIS — D692 Other nonthrombocytopenic purpura: Secondary | ICD-10-CM | POA: Diagnosis not present

## 2023-05-27 DIAGNOSIS — Z85828 Personal history of other malignant neoplasm of skin: Secondary | ICD-10-CM | POA: Diagnosis not present

## 2023-06-10 ENCOUNTER — Encounter: Payer: Self-pay | Admitting: Cardiovascular Disease

## 2023-06-17 ENCOUNTER — Encounter: Payer: Self-pay | Admitting: Family Medicine

## 2023-06-18 ENCOUNTER — Other Ambulatory Visit: Payer: Self-pay

## 2023-06-18 DIAGNOSIS — H6123 Impacted cerumen, bilateral: Secondary | ICD-10-CM

## 2023-06-19 ENCOUNTER — Encounter (INDEPENDENT_AMBULATORY_CARE_PROVIDER_SITE_OTHER): Payer: Self-pay | Admitting: Otolaryngology

## 2023-06-25 ENCOUNTER — Encounter: Payer: Self-pay | Admitting: Family Medicine

## 2023-06-25 ENCOUNTER — Ambulatory Visit (INDEPENDENT_AMBULATORY_CARE_PROVIDER_SITE_OTHER): Payer: Medicare Other | Admitting: Family Medicine

## 2023-06-25 VITALS — BP 140/82 | HR 59 | Temp 97.6°F | Ht 59.0 in | Wt 126.0 lb

## 2023-06-25 DIAGNOSIS — M542 Cervicalgia: Secondary | ICD-10-CM | POA: Insufficient documentation

## 2023-06-25 DIAGNOSIS — R5383 Other fatigue: Secondary | ICD-10-CM

## 2023-06-25 DIAGNOSIS — M47816 Spondylosis without myelopathy or radiculopathy, lumbar region: Secondary | ICD-10-CM | POA: Insufficient documentation

## 2023-06-25 NOTE — Progress Notes (Signed)
 Subjective:    Patient ID: Brittany Kirk, female    DOB: 07/08/1945, 78 y.o.   MRN: [id number]  Brittany Kirk is a 78 y.o. female with medical history significant of CAD with 85% ostial lesion that they cant stent without risking LAD, PAF s/p ablation on Eliquis . She underwent ablation in 2022.  Tried and failed amiodarone  for elevated PVC burden.   Wt Readings from Last 3 Encounters:  06/25/23 126 lb (57.2 kg)  03/27/23 126 lb 9.6 oz (57.4 kg)  03/08/23 126 lb (57.2 kg)    Patient presents with several months of fatigue.  She sleeps more than 8 hours every night.  However as soon as she wakes up she feels extremely sleepy and tired like she has not rested at all.  She states that she has no energy.  Her weight has remained stable.  She denies any chest pain or shortness of breath or dyspnea on exertion.  She denies any recent palpitations or irregular heartbeats.  She denies any abdominal pain or nausea or vomiting.  She denies any pleurisy or office of cough or wheezing.  She denies any fevers or chills.  She denies any depression.  Her review of systems is negative aside from some mild pain in both legs and bilateral decreased hearing due to cerumen impactions  Past Medical History:  Diagnosis Date   Acid reflux    Allergic rhinitis    Anxiety    Asymptomatic bilateral carotid artery stenosis    40-59% (02/2018)   Atrial fibrillation (HCC) 07/2019   Breast cancer (HCC) 2015   Right Breast Cancer   CAD (coronary artery disease)    CKD (chronic kidney disease), stage III (HCC)    Depression    Emphysema    no longer uses inhalers   Gallstones    nausea, pain upper right abdomen   GERD (gastroesophageal reflux disease)    H/O hiatal hernia    Hiatal hernia    History of skin cancer    Hyperlipidemia    Hypertension    Multinodular goiter (nontoxic)    Scoliosis    Skin cancer    Tobacco user    Wears dentures    top   Past Surgical History:  Procedure Laterality Date    ABDOMINAL AORTOGRAM W/LOWER EXTREMITY N/A 02/05/2022   Procedure: ABDOMINAL AORTOGRAM W/LOWER EXTREMITY;  Surgeon: Court Dorn PARAS, MD;  Location: [facility];  Service: Cardiovascular;  Laterality: N/A;   ATRIAL FIBRILLATION ABLATION N/A 04/14/2021   Procedure: ATRIAL FIBRILLATION ABLATION;  Surgeon: Inocencio Soyla Lunger, MD;  Location: [facility];  Service: Cardiovascular;  Laterality: N/A;   BREAST LUMPECTOMY Right 2015   CHOLECYSTECTOMY  07/08/2012   Procedure: LAPAROSCOPIC CHOLECYSTECTOMY WITH INTRAOPERATIVE CHOLANGIOGRAM;  Surgeon: Camellia CHRISTELLA Blush, MD,FACS;  Location: [facility];  Service: General;  Laterality: N/A;  Laparoscopic Cholecystectomy with Intraoperative Cholangiogram   COLONOSCOPY     ESOPHAGOSCOPY N/A 05/29/2017   Procedure: ESOPHAGOSCOPY;  Surgeon: Rosalie Kitchens, MD;  Location: [facility] ENDOSCOPY;  Service: Endoscopy;  Laterality: N/A;  botox  injection   HAND SURGERY Left    wrist   LEFT HEART CATH AND CORONARY ANGIOGRAPHY N/A 10/11/2017   Procedure: LEFT HEART CATH AND CORONARY ANGIOGRAPHY;  Surgeon: Jordan, Peter M, MD;  Location: [facility] INVASIVE CV LAB;  Service: Cardiovascular;  Laterality: N/A;   TONSILECTOMY, ADENOIDECTOMY, BILATERAL MYRINGOTOMY AND TUBES     TUBAL LIGATION     Current Outpatient Medications on File Prior to Visit  Medication Sig Dispense Refill   acetaminophen  (TYLENOL ) 500 MG tablet Take 1,000 mg by mouth every 6 (six) hours as needed for moderate pain or headache.     apixaban  (ELIQUIS ) 5 MG TABS tablet Take 1 tablet (5 mg total) by mouth 2 (two) times daily. 180 tablet 1   ascorbic acid  (VITAMIN C) 1000 MG tablet Take 1,000 mg by mouth daily.     atorvastatin  (LIPITOR) 40 MG tablet TAKE 1 TABLET BY MOUTH EVERYDAY AT BEDTIME 90 tablet 1   Cholecalciferol  (VITAMIN D ) 125 MCG (5000 UT) CAPS Take 5,000 Units by mouth daily.     diazepam  (VALIUM ) 5 MG tablet Take 5 mg by mouth as needed for sedation.     omeprazole (PRILOSEC) 20 MG capsule Take 20 mg by  mouth daily.     valsartan  (DIOVAN ) 40 MG tablet Take 1 tablet (40 mg total) by mouth daily. Hold for Blood Pressure <120 90 tablet 3   No current facility-administered medications on file prior to visit.   Allergies  Allergen Reactions   Penicillins Anaphylaxis, Itching, Swelling and Rash    Has patient had a PCN reaction causing immediate rash, facial/tongue/throat swelling, SOB or lightheadedness with hypotension: Yes Has patient had a PCN reaction causing severe rash involving mucus membranes or skin necrosis: No Has patient had a PCN reaction that required hospitalization: Yes Has patient had a PCN reaction occurring within the last 10 years: No If all of the above answers are NO, then may proceed with Cephalosporin use.    Fosamax  [Alendronate ]     myalgias   Levofloxacin  Nausea Only and Other (See Comments)    Causes headache, fatigue and dizziness   Mexiletine Itching, Nausea Only and Other (See Comments)   Trimethoprim    Cefaclor Itching, Swelling and Rash   Latex Itching and Rash   Metronidazole Itching, Swelling and Rash   Naproxen Itching, Swelling and Rash   Nsaids Itching, Swelling and Rash    Ibuprofen  IS tolerated   Septra [Bactrim] Itching, Swelling and Rash   Sulfonamide Derivatives Itching, Swelling and Rash   Tape Itching and Rash    ONLY USE PAPER TAPE   Wound Dressing Adhesive Itching and Rash   Social History   Socioeconomic History   Marital status: Married    Spouse name: Actor   Number of children: 1   Years of education: Not on file   Highest education level: Some college, no degree  Occupational History   Occupation: retired truck Nurse, Learning Disability: RETIRED  Tobacco Use   Smoking status: Former    Current packs/day: 0.00    Average packs/day: 0.5 packs/day for 46.0 years (23.0 ttl pk-yrs)    Types: Cigarettes    Start date: 02/12/1965    Quit date: 02/13/2011    Years since quitting: 12.3   Smokeless tobacco: Never  Vaping Use    Vaping status: Never Used  Substance and Sexual Activity   Alcohol use: No   Drug use: No   Sexual activity: Not on file  Other Topics Concern   Not on file  Social History Narrative   Married since 1991, second marriage.    1 son   1 grandson   1 granddaughter   2 step children   3 step grandchildren   Social Drivers of Corporate Investment Banker Strain: Low Risk  (06/24/2023)   Overall Financial Resource Strain (CARDIA)    Difficulty of Paying Living Expenses: Not very hard  Food Insecurity: No Food Insecurity (06/24/2023)   Hunger Vital Sign    Worried About Running Out of Food in the Last Year: Never true    Ran Out of Food in the Last Year: Never true  Transportation Needs: No Transportation Needs (06/24/2023)   PRAPARE - Administrator, Civil Service (Medical): No    Lack of Transportation (Non-Medical): No  Physical Activity: Unknown (06/24/2023)   Exercise Vital Sign    Days of Exercise per Week: Patient declined    Minutes of Exercise per Session: 20 min  Stress: Stress Concern Present (06/24/2023)   [employer] of Occupational Health - Occupational Stress Questionnaire    Feeling of Stress : To some extent  Social Connections: Socially Integrated (06/24/2023)   Social Connection and Isolation Panel [NHANES]    Frequency of Communication with Friends and Family: Twice a week    Frequency of Social Gatherings with Friends and Family: Once a week    Attends Religious Services: More than 4 times per year    Active Member of [employer] or Organizations: No    Attends Engineer, Structural: More than 4 times per year    Marital Status: Married  Catering Manager Violence: Not At Risk (07/12/2022)   Humiliation, Afraid, Rape, and Kick questionnaire    Fear of Current or Ex-Partner: No    Emotionally Abused: No    Physically Abused: No    Sexually Abused: No     Review of Systems  All other systems reviewed and are negative.      Objective:    Physical Exam Vitals reviewed.  Constitutional:      General: She is not in acute distress.    Appearance: She is well-developed. She is not diaphoretic.  HENT:     Head: Normocephalic and atraumatic.     Right Ear: There is impacted cerumen.     Left Ear: There is impacted cerumen.     Mouth/Throat:     Pharynx: No oropharyngeal exudate.  Eyes:     Conjunctiva/sclera: Conjunctivae normal.     Pupils: Pupils are equal, round, and reactive to light.  Neck:     Thyroid : No thyromegaly.     Vascular: No JVD.  Cardiovascular:     Rate and Rhythm: Normal rate and regular rhythm.     Pulses:          Dorsalis pedis pulses are 1+ on the right side and 1+ on the left side.       Posterior tibial pulses are 1+ on the right side and 1+ on the left side.     Heart sounds: Normal heart sounds. No murmur heard.    No friction rub. No gallop.  Pulmonary:     Effort: Pulmonary effort is normal. No respiratory distress.     Breath sounds: Normal breath sounds. No stridor. No wheezing or rales.  Abdominal:     General: Bowel sounds are normal. There is no distension.     Palpations: Abdomen is soft.     Tenderness: There is no abdominal tenderness. There is no guarding.  Musculoskeletal:     Cervical back: Normal range of motion.     Right lower leg: No edema.     Left lower leg: No edema.  Lymphadenopathy:     Cervical: No cervical adenopathy.  Neurological:     Mental Status: She is alert and oriented to person, place, and time.     Cranial Nerves: No cranial  nerve deficit.     Sensory: No sensory deficit.     Motor: Tremor present. No atrophy, abnormal muscle tone or seizure activity.     Deep Tendon Reflexes: Reflexes are normal and symmetric.           Assessment & Plan:  Fatigue, unspecified type - Plan: CBC with Differential/Platelet, COMPLETE METABOLIC PANEL WITH GFR, TSH, Sedimentation rate, Vitamin B12 Patient has known peripheral vascular disease.  Aside from the leg  pain that she has from this, she denies any other symptoms on her review of systems that would point to a cause of the fatigue.  My biggest concern is potentially sleep apnea although she denies her husband ever hearing her stop breathing.  Recommended checking a CBC a CMP a TSH and B12 and sedimentation rate.  If lab work is completely normal, I would recommend a sleep study given her hypersomnolence and fatigue

## 2023-06-26 LAB — COMPLETE METABOLIC PANEL WITHOUT GFR
AG Ratio: 1.7 (calc) (ref 1.0–2.5)
ALT: 12 U/L (ref 6–29)
AST: 16 U/L (ref 10–35)
Albumin: 3.9 g/dL (ref 3.6–5.1)
Alkaline phosphatase (APISO): 57 U/L (ref 37–153)
BUN/Creatinine Ratio: 29 (calc) — ABNORMAL HIGH (ref 6–22)
BUN: 27 mg/dL — ABNORMAL HIGH (ref 7–25)
CO2: 26 mmol/L (ref 20–32)
Calcium: 8.9 mg/dL (ref 8.6–10.4)
Chloride: 106 mmol/L (ref 98–110)
Creat: 0.92 mg/dL (ref 0.60–1.00)
Globulin: 2.3 g/dL (ref 1.9–3.7)
Glucose, Bld: 93 mg/dL (ref 65–99)
Potassium: 4.3 mmol/L (ref 3.5–5.3)
Sodium: 141 mmol/L (ref 135–146)
Total Bilirubin: 0.5 mg/dL (ref 0.2–1.2)
Total Protein: 6.2 g/dL (ref 6.1–8.1)
eGFR: 63 mL/min/{1.73_m2}

## 2023-06-26 LAB — CBC WITH DIFFERENTIAL/PLATELET
Absolute Lymphocytes: 2322 {cells}/uL (ref 850–3900)
Absolute Monocytes: 490 {cells}/uL (ref 200–950)
Basophils Absolute: 7 {cells}/uL (ref 0–200)
Basophils Relative: 0.1 %
Eosinophils Absolute: 28 {cells}/uL (ref 15–500)
Eosinophils Relative: 0.4 %
HCT: 36.7 % (ref 35.0–45.0)
Hemoglobin: 12.2 g/dL (ref 11.7–15.5)
MCH: 32.4 pg (ref 27.0–33.0)
MCHC: 33.2 g/dL (ref 32.0–36.0)
MCV: 97.3 fL (ref 80.0–100.0)
MPV: 10.5 fL (ref 7.5–12.5)
Monocytes Relative: 6.9 %
Neutro Abs: 4253 {cells}/uL (ref 1500–7800)
Neutrophils Relative %: 59.9 %
Platelets: 241 10*3/uL (ref 140–400)
RBC: 3.77 10*6/uL — ABNORMAL LOW (ref 3.80–5.10)
RDW: 12.6 % (ref 11.0–15.0)
Total Lymphocyte: 32.7 %
WBC: 7.1 10*3/uL (ref 3.8–10.8)

## 2023-06-26 LAB — VITAMIN B12: Vitamin B-12: 999 pg/mL (ref 200–1100)

## 2023-06-26 LAB — SEDIMENTATION RATE: Sed Rate: 11 mm/h (ref 0–30)

## 2023-06-26 LAB — TSH: TSH: 0.4 m[IU]/L (ref 0.40–4.50)

## 2023-06-27 DIAGNOSIS — Z961 Presence of intraocular lens: Secondary | ICD-10-CM | POA: Diagnosis not present

## 2023-06-27 DIAGNOSIS — H353131 Nonexudative age-related macular degeneration, bilateral, early dry stage: Secondary | ICD-10-CM | POA: Diagnosis not present

## 2023-06-27 DIAGNOSIS — H35363 Drusen (degenerative) of macula, bilateral: Secondary | ICD-10-CM | POA: Diagnosis not present

## 2023-06-27 DIAGNOSIS — H52223 Regular astigmatism, bilateral: Secondary | ICD-10-CM | POA: Diagnosis not present

## 2023-06-27 DIAGNOSIS — H43393 Other vitreous opacities, bilateral: Secondary | ICD-10-CM | POA: Diagnosis not present

## 2023-06-27 DIAGNOSIS — Z9849 Cataract extraction status, unspecified eye: Secondary | ICD-10-CM | POA: Diagnosis not present

## 2023-06-27 DIAGNOSIS — H53143 Visual discomfort, bilateral: Secondary | ICD-10-CM | POA: Diagnosis not present

## 2023-06-27 DIAGNOSIS — H5201 Hypermetropia, right eye: Secondary | ICD-10-CM | POA: Diagnosis not present

## 2023-07-03 ENCOUNTER — Other Ambulatory Visit: Payer: Self-pay | Admitting: Family Medicine

## 2023-07-03 NOTE — Telephone Encounter (Signed)
Last ov 06/25/23 Requested Prescriptions  Pending Prescriptions Disp Refills   atorvastatin (LIPITOR) 40 MG tablet [Pharmacy Med Name: ATORVASTATIN 40 MG TABLET] 90 tablet 1    Sig: TAKE 1 TABLET BY MOUTH EVERYDAY AT BEDTIME     Cardiovascular:  Antilipid - Statins Failed - 07/03/2023  9:50 AM      Failed - Valid encounter within last 12 months    Recent Outpatient Visits           2 years ago Paroxysmal atrial fibrillation (HCC)   [facility] Pickard, Priscille Heidelberg, MD   2 years ago Viral upper respiratory tract infection   [facility] Valentino Nose, NP   2 years ago Dysuria   [facility] Tanya Nones, Priscille Heidelberg, MD   2 years ago Strain of lumbar region, initial encounter   [facility] Pickard, Priscille Heidelberg, MD   2 years ago Left hip pain   [facility] Tanya Nones, Priscille Heidelberg, MD              Failed - Lipid Panel in normal range within the last 12 months    Cholesterol, Total  Date Value Ref Range Status  02/28/2022 152 100 - 199 mg/dL Final   Cholesterol  Date Value Ref Range Status  09/11/2022 129 <200 mg/dL Final   LDL Cholesterol (Calc)  Date Value Ref Range Status  09/11/2022 52 mg/dL (calc) Final    Comment:    Reference range: <100 . Desirable range <100 mg/dL for primary prevention;   <70 mg/dL for patients with CHD or diabetic patients  with > or = 2 CHD risk factors. Marland Kitchen LDL-C is now calculated using the Martin-Hopkins  calculation, which is a validated novel method providing  better accuracy than the Friedewald equation in the  estimation of LDL-C.  Horald Pollen et al. Lenox Ahr. 5284;132(44): 2061-2068  (http://education.[facility].com/faq/FAQ164)    HDL  Date Value Ref Range Status  09/11/2022 63 > OR = 50 mg/dL Final  06/13/7251 71 >66 mg/dL Final   Triglycerides  Date Value Ref Range Status  09/11/2022 63 <150 mg/dL Final         Passed - Patient is not pregnant

## 2023-07-24 ENCOUNTER — Telehealth (INDEPENDENT_AMBULATORY_CARE_PROVIDER_SITE_OTHER): Payer: Self-pay | Admitting: Otolaryngology

## 2023-07-24 NOTE — Telephone Encounter (Signed)
LVM to confirm appt & location [id number] afm

## 2023-07-25 ENCOUNTER — Ambulatory Visit (INDEPENDENT_AMBULATORY_CARE_PROVIDER_SITE_OTHER): Payer: Medicare Other | Admitting: Otolaryngology

## 2023-07-25 VITALS — BP 134/67 | HR 52

## 2023-07-25 DIAGNOSIS — H7293 Unspecified perforation of tympanic membrane, bilateral: Secondary | ICD-10-CM

## 2023-07-25 DIAGNOSIS — H6123 Impacted cerumen, bilateral: Secondary | ICD-10-CM | POA: Diagnosis not present

## 2023-07-25 DIAGNOSIS — H938X3 Other specified disorders of ear, bilateral: Secondary | ICD-10-CM

## 2023-07-25 DIAGNOSIS — H9190 Unspecified hearing loss, unspecified ear: Secondary | ICD-10-CM

## 2023-07-25 MED ORDER — OFLOXACIN 0.3 % OT SOLN: 5.0000 [drp] | Freq: Two times a day (BID) | OTIC | 1 refills | Status: AC

## 2023-07-25 NOTE — Progress Notes (Signed)
 Dear Dr. Tanya Nones, Here is my assessment for our mutual patient, Brittany Kirk. Thank you for allowing me the opportunity to care for your patient. Please do not hesitate to contact me should you have any other questions. Sincerely, Dr. Jovita Kussmaul  Otolaryngology Clinic Note Referring provider: Dr. Tanya Nones HPI:  Brittany Kirk is a 78 y.o. female kindly referred by Dr. Tanya Nones for evaluation of cerumen impaction  Patient reports: b/l cerumen impaction and issues with wax; she reports she has had history of b/l TM perforation, and thus cannot get irrigated; some fullness b/l and longstanding hearing loss. Ear infections as a child Patient denies: ear pain,vertigo, drainage Patient additionally denies: deep pain in ear canal, eustachian tube symptoms such as popping Patient also denies barotrauma, vestibular suppressant use, ototoxic medication use Prior ear surgery: ear tubes as child  H&N Surgery: T&A, Ear tubes Personal or FHx of bleeding dz or anesthesia difficulty: no   GLP-1: no AP/AC: Eliquis  Tobacco: quit, prior smoker  PMHx: HTN, GERD, A-fib on Apixaban (s/p ablation), b/l ICA stenosis, PAD  Independent Review of Additional Tests or Records:  Dr. Tanya Nones (IM) (PCP) 1//14/2025: noted b/l cerumen impactions and hearing loss; no other ear issues; Rx: ref to [facility] 07/06/2016 independently interpreted with respect to sinuses and ears: mastoids and ME well aerated b/l; cuts thick so cannot note any perforation; no significant paranasal sinus disease; minimal left ethmoid disease  PMH/Meds/All/SocHx/FamHx/ROS:   Past Medical History:  Diagnosis Date   Acid reflux    Allergic rhinitis    Anxiety    Asymptomatic bilateral carotid artery stenosis    40-59% (02/2018)   Atrial fibrillation (HCC) 07/2019   Breast cancer (HCC) 2015   Right Breast Cancer   CAD (coronary artery disease)    CKD (chronic kidney disease), stage III (HCC)    Depression    Emphysema    no longer  uses inhalers   Gallstones    nausea, pain upper right abdomen   GERD (gastroesophageal reflux disease)    H/O hiatal hernia    Hiatal hernia    History of skin cancer    Hyperlipidemia    Hypertension    Multinodular goiter (nontoxic)    Scoliosis    Skin cancer    Tobacco user    Wears dentures    top     Past Surgical History:  Procedure Laterality Date   ABDOMINAL AORTOGRAM W/LOWER EXTREMITY N/A 02/05/2022   Procedure: ABDOMINAL AORTOGRAM W/LOWER EXTREMITY;  Surgeon: Runell Gess, MD;  Location: [facility];  Service: Cardiovascular;  Laterality: N/A;   ATRIAL FIBRILLATION ABLATION N/A 04/14/2021   Procedure: ATRIAL FIBRILLATION ABLATION;  Surgeon: Regan Lemming, MD;  Location: [facility];  Service: Cardiovascular;  Laterality: N/A;   BREAST LUMPECTOMY Right 2015   CHOLECYSTECTOMY  07/08/2012   Procedure: LAPAROSCOPIC CHOLECYSTECTOMY WITH INTRAOPERATIVE CHOLANGIOGRAM;  Surgeon: Atilano Ina, MD,FACS;  Location: [facility];  Service: General;  Laterality: N/A;  Laparoscopic Cholecystectomy with Intraoperative Cholangiogram   COLONOSCOPY     ESOPHAGOSCOPY N/A 05/29/2017   Procedure: ESOPHAGOSCOPY;  Surgeon: Vida Rigger, MD;  Location: [facility] ENDOSCOPY;  Service: Endoscopy;  Laterality: N/A;  botox injection   HAND SURGERY Left    wrist   LEFT HEART CATH AND CORONARY ANGIOGRAPHY N/A 10/11/2017   Procedure: LEFT HEART CATH AND CORONARY ANGIOGRAPHY;  Surgeon: Swaziland, Peter M, MD;  Location: [facility] INVASIVE CV LAB;  Service: Cardiovascular;  Laterality: N/A;   TONSILECTOMY, ADENOIDECTOMY, BILATERAL MYRINGOTOMY  AND TUBES     TUBAL LIGATION      Family History  Problem Relation Age of Onset   Arthritis Mother    Pancreatic cancer Mother    Heart disease Other        5 brothers and 2 sister   Diverticulitis Sister    Heart disease Sister    Aplastic anemia Other      Social Connections: Moderately Integrated (06/24/2023)   Social Connection and Isolation Panel  [NHANES]    Frequency of Communication with Friends and Family: Twice a week    Frequency of Social Gatherings with Friends and Family: Once a week    Attends Religious Services: More than 4 times per year    Active Member of [employer] or Organizations: No    Attends Engineer, structural: Not on file    Marital Status: Married      Current Outpatient Medications:    acetaminophen (TYLENOL) 500 MG tablet, Take 1,000 mg by mouth every 6 (six) hours as needed for moderate pain or headache., Disp: , Rfl:    apixaban (ELIQUIS) 5 MG TABS tablet, Take 1 tablet (5 mg total) by mouth 2 (two) times daily., Disp: 180 tablet, Rfl: 1   ascorbic acid (VITAMIN C) 1000 MG tablet, Take 1,000 mg by mouth daily., Disp: , Rfl:    atorvastatin (LIPITOR) 40 MG tablet, TAKE 1 TABLET BY MOUTH EVERYDAY AT BEDTIME, Disp: 90 tablet, Rfl: 0   Cholecalciferol (VITAMIN D) 125 MCG (5000 UT) CAPS, Take 5,000 Units by mouth daily., Disp: , Rfl:    diazepam (VALIUM) 5 MG tablet, Take 5 mg by mouth as needed for sedation., Disp: , Rfl:    ofloxacin (FLOXIN) 0.3 % OTIC solution, Place 5 drops into the left ear 2 (two) times daily for 14 days., Disp: 10 mL, Rfl: 1   omeprazole (PRILOSEC) 20 MG capsule, Take 20 mg by mouth daily., Disp: , Rfl:    valsartan (DIOVAN) 40 MG tablet, Take 1 tablet (40 mg total) by mouth daily. Hold for Blood Pressure <120, Disp: 90 tablet, Rfl: 3   Physical Exam:   BP 134/67 (BP Location: Left Arm, Patient Position: Sitting, Cuff Size: Normal)   Pulse (!) 52   SpO2 99%   Salient findings:  CN II-XII intact Given history and complaints, ear microscopy was indicated and performed for evaluation with findings as below in physical exam section and in procedures; bilateral cerumen impaction; after clearance: Right modest pars flaccida retraction, diffuse myringosclerosis, posterior monomeric part but don't see perforation; there is maybe some focal type 3 myringoincudopexy  Left unable to  appreciate any TM perforation but thin film of wax on TM so cannot see anterior portion, not cleared due to tolerance Weber 512: mid Rinne 512: AC > BC b/l  Anterior rhinoscopy: Septum intact; bilateral inferior turbinates with mild hypertrophy No lesions of oral cavity/oropharynx No obviously palpable neck masses/lymphadenopathy/thyromegaly No respiratory distress or stridor   Seprately Identifiable Procedures:  Procedure: Bilateral ear microscopy and cerumen removal using microscope (CPT 96295) - Mod 25 Pre-procedure diagnosis: Cerumen impaction bilateral external ears Post-procedure diagnosis: same Indication: bilateral cerumen impaction; given patient's otologic complaints and history as well as for improved and comprehensive examination of external ear and tympanic membrane, bilateral otologic examination using microscope was performed and impacted cerumen removed  Procedure: Patient was placed semi-recumbent. Both ear canals were examined using the microscope with findings above. Impacted Cerumen removed on left and on right using suction and currette  with improvement in EAC examination and patency. Patient tolerated the procedure well.  Impression & Plans:  Brittany Kirk is a 78 y.o. female with:  1. Tympanic membrane perforation, bilateral   2. Subjective hearing loss   3. Sensation of fullness in both ears   4. Bilateral impacted cerumen    Concern for b/l TM perforation which she reports long-standing. Unable to visualize one on right, left small film so will do ofloxacin to improve visualization and dissolve remaining small amount of cerumen which is not impacted Does have HL and fullness as well so will get audio  - Ofloxacin left ear BID x14d 4 drops - f/u 8 weeks with audio  See below regarding exact medications prescribed this encounter including dosages and route: Meds ordered this encounter  Medications   ofloxacin (FLOXIN) 0.3 % OTIC solution    Sig: Place 5  drops into the left ear 2 (two) times daily for 14 days.    Dispense:  10 mL    Refill:  1      Thank you for allowing me the opportunity to care for your patient. Please do not hesitate to contact me should you have any other questions.  Sincerely, Jovita Kussmaul, MD Otolaryngologist (ENT), [facility] Phone: [phone] Fax: [phone]  08/04/2023, 3:52 PM   MDM:  Level 4 Complexity/Problems addressed: mod - multiple problems Data complexity: mod - independent review of notes, labs; independent interpretation of imaging - Morbidity: mod  - Prescription Drug prescribed or managed: yes

## 2023-07-25 NOTE — Patient Instructions (Signed)
Use ofloxacin ear drops 4 drops twice daily in left ear

## 2023-07-26 ENCOUNTER — Telehealth (INDEPENDENT_AMBULATORY_CARE_PROVIDER_SITE_OTHER): Payer: Self-pay

## 2023-07-26 NOTE — Telephone Encounter (Signed)
The patient called and was wondering why she was given antibiotic drops.   I checked with Dr Allena Katz, he stated that was the safest way to dissolve the wax since she has a hole in her eardrum, the patient understood

## 2023-07-30 ENCOUNTER — Telehealth: Payer: Self-pay | Admitting: Cardiovascular Disease

## 2023-07-30 NOTE — Telephone Encounter (Signed)
 Patient identification verified by 2 forms. Marilynn Rail, RN    Called and spoke to patient  Patient states:   -has blockage in aorta that is affecting blood flow to her feet   -for over a year she has had purple discoloration in feet when walking/standing   -1 month ago she developed purple spots on both of her feet   -her husband states her feet are a lot colder   -the skin on her feet look flimsy and flaky -discoloration improves when she put her feet up   Patient scheduled for OV 2/19 at 9:45am with Dr. Allyson Sabal  Patient agrees, no questions at this time

## 2023-07-30 NOTE — Telephone Encounter (Signed)
 Pt called in stating she has some dark purple, circle spots on her skin. She states sometimes her husband will rub her feet and notice they are really cold. She also states her skin looks like crepy paper. Please advise.

## 2023-07-31 ENCOUNTER — Ambulatory Visit: Payer: Medicare Other | Attending: Cardiovascular Disease | Admitting: Cardiovascular Disease

## 2023-07-31 ENCOUNTER — Encounter: Payer: Self-pay | Admitting: Cardiovascular Disease

## 2023-07-31 VITALS — BP 136/50 | HR 55 | Ht 59.0 in | Wt 121.2 lb

## 2023-07-31 DIAGNOSIS — I739 Peripheral vascular disease, unspecified: Secondary | ICD-10-CM | POA: Insufficient documentation

## 2023-07-31 DIAGNOSIS — I10 Essential (primary) hypertension: Secondary | ICD-10-CM | POA: Diagnosis not present

## 2023-07-31 DIAGNOSIS — I480 Paroxysmal atrial fibrillation: Secondary | ICD-10-CM | POA: Diagnosis not present

## 2023-07-31 DIAGNOSIS — I6523 Occlusion and stenosis of bilateral carotid arteries: Secondary | ICD-10-CM | POA: Insufficient documentation

## 2023-07-31 DIAGNOSIS — E782 Mixed hyperlipidemia: Secondary | ICD-10-CM | POA: Insufficient documentation

## 2023-07-31 DIAGNOSIS — Q251 Coarctation of aorta: Secondary | ICD-10-CM | POA: Diagnosis not present

## 2023-07-31 NOTE — Patient Instructions (Signed)
 Medication Instructions:  Your physician recommends that you continue on your current medications as directed. Please refer to the Current Medication list given to you today.  *If you need a refill on your cardiac medications before your next appointment, please call your pharmacy*   Testing/Procedures: Your physician has requested that you have an Aorta/Iliac Duplex. This will be take place at [address] [facility], [address].  No food after 11PM the night before.  Water is OK. (Don't drink liquids if you have been instructed not to for ANOTHER test) Avoid foods that produce bowel gas, for 24 hours prior to exam (see below). No breakfast, no chewing gum, no smoking or carbonated beverages. Patient may take morning medications with water. Come in for test at least 15 minutes early to register.  Please note: We ask at that you not bring children with you during ultrasound (echo/ vascular) testing. Due to room size and safety concerns, children are not allowed in the ultrasound rooms during exams. Our front office staff cannot provide observation of children in our lobby area while testing is being conducted. An adult accompanying a patient to their appointment will only be allowed in the ultrasound room at the discretion of the ultrasound technician under special circumstances. We apologize for any inconvenience.  Your physician has requested that you have an ankle brachial index (ABI). During this test an ultrasound and blood pressure cuff are used to evaluate the arteries that supply the arms and legs with blood. Allow thirty minutes for this exam. There are no restrictions or special instructions. This will take place at [address] [facility] [address].    Please note: We ask at that you not bring children with you during ultrasound (echo/ vascular) testing. Due to room size and safety concerns, children are not allowed in the ultrasound rooms during exams. Our front office staff cannot provide  observation of children in our lobby area while testing is being conducted. An adult accompanying a patient to their appointment will only be allowed in the ultrasound room at the discretion of the ultrasound technician under special circumstances. We apologize for any inconvenience.   Non-Cardiac CT Angiography (CTA) Abdomen/pelvis, is a special type of CT scan that uses a computer to produce multi-dimensional views of major blood vessels throughout the body. In CT angiography, a contrast material is injected through an IV to help visualize the blood vessels    Follow-Up: At [facility], you and your health needs are our priority.  As part of our continuing mission to provide you with exceptional heart care, we have created designated Provider Care Teams.  These Care Teams include your primary Cardiologist (physician) and Advanced Practice Providers (APPs -  Physician Assistants and Nurse Practitioners) who all work together to provide you with the care you need, when you need it.  We recommend signing up for the patient portal called "MyChart".  Sign up information is provided on this After Visit Summary.  MyChart is used to connect with patients for Virtual Visits (Telemedicine).  Patients are able to view lab/test results, encounter notes, upcoming appointments, etc.  Non-urgent messages can be sent to your provider as well.   To learn more about what you can do with MyChart, go to [email or web address].    Your next appointment:   6-8 week(s)  Provider:   Nanetta Batty, MD     Other Instructions   1st Floor: - Lobby - Registration  - Pharmacy  - Lab - Cafe  2nd Floor: -  PV Lab - Diagnostic Testing (echo, CT, nuclear med)  3rd Floor: - Vacant  4th Floor: - TCTS (cardiothoracic surgery) - AFib Clinic - Structural Heart Clinic - Vascular Surgery  - Vascular Ultrasound  5th Floor: - [facility] (general and EP) - Clinical Pharmacy for coumadin,  hypertension, lipid, weight-loss medications, and med management appointments    Valet parking services will be available as well.

## 2023-07-31 NOTE — Assessment & Plan Note (Signed)
 History of PAD with diffuse cyanosis of her feet and pain in her thighs and calves when she walks.  Her last Doppler studies performed 12/07/2021 revealed ABIs in the mid 7 range bilaterally with high-frequency signal in the mid distal abdominal aorta and bilateral iliac arteries.  I did perform outpatient peripheral angiography on her 02/05/2022 revealing 50% diffuse narrowing of the infrarenal abdominal aorta extending down to the iliac bifurcation.  There was a 30 mm pullback gradient after administration of 200 mcg of intra-arterial nitroglycerin.  There is a 40% ostial right common iliac artery stenosis with three-vessel runoff bilaterally.  Since I saw her last she has had progressive discomfort in her thighs and calves with ambulation and cyanosis of her feet which are cool to touch.  I am concerned that she has had progressive disease in her infrarenal abdominal aorta contributing to her symptoms.  Will repeat her Doppler studies and CTA of her abdomen pelvis before making further recommendations.

## 2023-07-31 NOTE — Assessment & Plan Note (Signed)
 History of moderate bilateral ICA stenosis by duplex ultrasound performed 04/08/2023.  These have remained relatively stable.  We will repeat Dopplers in October.

## 2023-07-31 NOTE — Assessment & Plan Note (Signed)
 History PAF maintaining sinus rhythm on Eliquis oral anticoagulation.

## 2023-07-31 NOTE — Progress Notes (Signed)
 07/31/2023 [name]   1945-09-30  [id number]  Primary Physician Pickard, Priscille Heidelberg, MD Primary Cardiologist: Runell Gess MD Nicholes Calamity, [place]  HPI:  Brittany Kirk is a 78 y.o.  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 She was referred by Dr. Tanya Nones for cardiovascular evaluation because of chest pain. I last saw her in the office 03/27/2023.  Risk factors include 25 pack years of tobacco abuse having quit 3 years ago. She has treated hypertension as well as a strong family history of heart disease with multiple siblings who died at a premature age of myocardial infarction's. She has never had a heart attack or stroke. She has had a remote cardiac catheterization by Dr. Riley Kill but no intervention was performed. She was complaining of some substernal chest pain with chronic nausea. This has since some sided after beginning on a proton pump inhibitor. She had a negative stress test 01/24/16 and again during a recent consultation for chest pain 01/05/17. She has had upper and lower endoscopy for abdominal pain and nausea which was unrevealing.   She had diagnostic coronary angiography performed by Dr. Swaziland 5/19 showing moderately severe ramus branch ostial disease with noncritical disease otherwise a normal LV function.  This was unchanged from prior cath performed in 2010 it was thought that her chest pain was noncardiac in nature.   She was seen in the emergency room on 07/15/2019 for A. fib with RVR.  She converted spontaneously to sinus rhythm and was begun on apixaban by Dr. Cristal Deer.  She has had a few episodes of brief AF since but for the most part is asymptomatic from this.   She was admitted in early August with A. fib with RVR and converted.  Her troponins were mildly elevated.  Her 2D echo was normal.  She has had no recurrent episodes.  She she saw Dr.  Elberta Fortis for EP follow-up who had an A. fib ablation on 04/14/2021.  She saw Dr. Elberta Fortis because of symptomatic PVCs who began her on mexiletine.  Unfortunately, this is exacerbated her baseline tremor.  Otherwise she has remained stable.  She also complains of reflux type symptoms on Prilosec.  She still sees Dr. Leary Roca  a gastroenterologist for this.  Because of her intolerance to mexiletine she saw Dr. Elberta Fortis again who began amiodarone.  He loaded her for 3 weeks and began her on 200 mg a day.  Since that time she has been symptomatically bradycardic.  She did see Azalee Course PA-C in the office 11/23/2021 complaining of dependent cyanosis and weakness in her legs.  Doppler studies performed 12/07/2021 revealed right ABI of 0.78 and left of 0.75 with high-frequency signals of both iliac arteries.   I performed peripheral angiography on her 02/05/2022 revealing 50% segmental in for renal abdominal aortic narrowing, 40% ostial right common iliac artery stenosis with a 30 mm pullback gradient across her aorta and iliac.  I do not think her iliac appears angiographically significant.  All her infrainguinal vasculature was widely patent.  I have no explanation for her dependent cyanosis.   Since I saw her in the office 6 months ago she complains of progressive weakness and numbness in her lower extremities bilaterally especially in her thighs and calves when she walks.  Her feet are cyanotic and cool to the touch.  I am concerned that she has had  progressive narrowing of her infrarenal abdominal aorta.  I am in to repeat her Doppler studies and aortoiliac CTA to further evaluate.   Current Meds  Medication Sig   acetaminophen (TYLENOL) 500 MG tablet Take 1,000 mg by mouth every 6 (six) hours as needed for moderate pain or headache.   apixaban (ELIQUIS) 5 MG TABS tablet Take 1 tablet (5 mg total) by mouth 2 (two) times daily.   ascorbic acid (VITAMIN C) 1000 MG tablet Take 1,000 mg by mouth daily.    atorvastatin (LIPITOR) 40 MG tablet TAKE 1 TABLET BY MOUTH EVERYDAY AT BEDTIME   Cholecalciferol (VITAMIN D) 125 MCG (5000 UT) CAPS Take 5,000 Units by mouth daily.   diazepam (VALIUM) 5 MG tablet Take 5 mg by mouth as needed for sedation.   ofloxacin (FLOXIN) 0.3 % OTIC solution Place 5 drops into the left ear 2 (two) times daily for 14 days.   omeprazole (PRILOSEC) 20 MG capsule Take 20 mg by mouth daily.   valsartan (DIOVAN) 40 MG tablet Take 1 tablet (40 mg total) by mouth daily. Hold for Blood Pressure <120     Allergies  Allergen Reactions   Penicillins Anaphylaxis, Itching, Swelling and Rash    Has patient had a PCN reaction causing immediate rash, facial/tongue/throat swelling, SOB or lightheadedness with hypotension: Yes Has patient had a PCN reaction causing severe rash involving mucus membranes or skin necrosis: No Has patient had a PCN reaction that required hospitalization: Yes Has patient had a PCN reaction occurring within the last 10 years: No If all of the above answers are "NO", then may proceed with Cephalosporin use.    Fosamax [Alendronate]     myalgias   Levofloxacin Nausea Only and Other (See Comments)    Causes headache, fatigue and dizziness   Mexiletine Itching, Nausea Only and Other (See Comments)   Trimethoprim    Cefaclor Itching, Swelling and Rash   Latex Itching and Rash   Metronidazole Itching, Swelling and Rash   Naproxen Itching, Swelling and Rash   Nsaids Itching, Swelling and Rash    Ibuprofen IS tolerated   Septra [Bactrim] Itching, Swelling and Rash   Sulfonamide Derivatives Itching, Swelling and Rash   Tape Itching and Rash    ONLY USE PAPER TAPE   Wound Dressing Adhesive Itching and Rash    Social History   Socioeconomic History   Marital status: Married    Spouse name: Actor   Number of children: 1   Years of education: Not on file   Highest education level: Some college, no degree  Occupational History   Occupation: retired Cabin crew: RETIRED  Tobacco Use   Smoking status: Former    Current packs/day: 0.00    Average packs/day: 0.5 packs/day for 46.0 years (23.0 ttl pk-yrs)    Types: Cigarettes    Start date: 02/12/1965    Quit date: 02/13/2011    Years since quitting: 12.4   Smokeless tobacco: Never  Vaping Use   Vaping status: Never Used  Substance and Sexual Activity   Alcohol use: No   Drug use: No   Sexual activity: Not on file  Other Topics Concern   Not on file  Social History Narrative   Married since 1991, second marriage.    1 son   1 grandson   1 granddaughter   2 step children   3 step grandchildren   Social Drivers of Corporate investment banker Strain: Low Risk  (  06/24/2023)   Overall Financial Resource Strain (CARDIA)    Difficulty of Paying Living Expenses: Not very hard  Food Insecurity: No Food Insecurity (06/24/2023)   Hunger Vital Sign    Worried About Running Out of Food in the Last Year: Never true    Ran Out of Food in the Last Year: Never true  Transportation Needs: No Transportation Needs (06/24/2023)   PRAPARE - Administrator, Civil Service (Medical): No    Lack of Transportation (Non-Medical): No  Physical Activity: Unknown (06/24/2023)   Exercise Vital Sign    Days of Exercise per Week: Patient declined    Minutes of Exercise per Session: Not on file  Stress: Stress Concern Present (06/24/2023)   [employer] of Occupational Health - Occupational Stress Questionnaire    Feeling of Stress : To some extent  Social Connections: Moderately Integrated (06/24/2023)   Social Connection and Isolation Panel [NHANES]    Frequency of Communication with Friends and Family: Twice a week    Frequency of Social Gatherings with Friends and Family: Once a week    Attends Religious Services: More than 4 times per year    Active Member of [employer] or Organizations: No    Attends Banker Meetings: Not on file    Marital Status: Married   Catering manager Violence: Not At Risk (07/12/2022)   Humiliation, Afraid, Rape, and Kick questionnaire    Fear of Current or Ex-Partner: No    Emotionally Abused: No    Physically Abused: No    Sexually Abused: No     Review of Systems: General: negative for chills, fever, night sweats or weight changes.  Cardiovascular: negative for chest pain, dyspnea on exertion, edema, orthopnea, palpitations, paroxysmal nocturnal dyspnea or shortness of breath Dermatological: negative for rash Respiratory: negative for cough or wheezing Urologic: negative for hematuria Abdominal: negative for nausea, vomiting, diarrhea, bright red blood per rectum, melena, or hematemesis Neurologic: negative for visual changes, syncope, or dizziness All other systems reviewed and are otherwise negative except as noted above.    Blood pressure (!) 136/50, pulse (!) 55, height 4\' 11"  (1.499 m), weight 121 lb 3.2 oz (55 kg), SpO2 98%.  General appearance: alert and no distress Neck: no adenopathy, no JVD, supple, symmetrical, trachea midline, thyroid not enlarged, symmetric, no tenderness/mass/nodules, and bilateral carotid bruits Lungs: clear to auscultation bilaterally Heart: 2/6 outflow tract murmur consistent with aortic stenosis and/or sclerosis. Extremities: Symmetric bilateral cyanosis of her feet.  There are also cool to the touch. Pulses: Diminished pedal pulses Skin: Cyanosis of her feet bilaterally Neurologic: Grossly normal  EKG EKG Interpretation Date/Time:  Wednesday July 31 2023 09:41:19 EST Ventricular Rate:  55 PR Interval:  154 QRS Duration:  86 QT Interval:  434 QTC Calculation: 415 R Axis:   -2  Text Interpretation: Sinus bradycardia When compared with ECG of 16-Jun-2021 23:59, PREVIOUS ECG IS PRESENT Confirmed by Nanetta Batty [phone]) on 07/31/2023 10:02:05 AM    ASSESSMENT AND PLAN:   CAD in native artery History of nonobstructive CAD by From Dr. Swaziland 5/19 except for ostial  ramus branch disease which is unchanged from Performed in 2010.  She denies chest pain.  Hyperlipidemia History of hyperlipidemia on atorvastatin with lipid profile performed/2/24 revealing total cholesterol 129, LDL 52 and HDL 63.  Essential hypertension History of essential hypertension blood pressure measured today at 136/50.  She is on Diovan.  Bilateral carotid artery disease (HCC) History of moderate bilateral ICA stenosis by duplex  ultrasound performed 04/08/2023.  These have remained relatively stable.  We will repeat Dopplers in October.  Paroxysmal atrial fibrillation (HCC) History PAF maintaining sinus rhythm on Eliquis oral anticoagulation.  Peripheral arterial disease (HCC) History of PAD with diffuse cyanosis of her feet and pain in her thighs and calves when she walks.  Her last Doppler studies performed 12/07/2021 revealed ABIs in the mid 7 range bilaterally with high-frequency signal in the mid distal abdominal aorta and bilateral iliac arteries.  I did perform outpatient peripheral angiography on her 02/05/2022 revealing 50% diffuse narrowing of the infrarenal abdominal aorta extending down to the iliac bifurcation.  There was a 30 mm pullback gradient after administration of 200 mcg of intra-arterial nitroglycerin.  There is a 40% ostial right common iliac artery stenosis with three-vessel runoff bilaterally.  Since I saw her last she has had progressive discomfort in her thighs and calves with ambulation and cyanosis of her feet which are cool to touch.  I am concerned that she has had progressive disease in her infrarenal abdominal aorta contributing to her symptoms.  Will repeat her Doppler studies and CTA of her abdomen pelvis before making further recommendations.     Runell Gess MD FACP,FACC,FAHA, [facility] 07/31/2023 10:24 AM

## 2023-07-31 NOTE — Assessment & Plan Note (Signed)
 History of essential hypertension blood pressure measured today at 136/50.  She is on Diovan.

## 2023-07-31 NOTE — Assessment & Plan Note (Signed)
 History of nonobstructive CAD by From Dr. Swaziland 5/19 except for ostial ramus branch disease which is unchanged from Performed in 2010.  She denies chest pain.

## 2023-07-31 NOTE — Assessment & Plan Note (Signed)
 History of hyperlipidemia on atorvastatin with lipid profile performed/2/24 revealing total cholesterol 129, LDL 52 and HDL 63.

## 2023-08-04 ENCOUNTER — Encounter (INDEPENDENT_AMBULATORY_CARE_PROVIDER_SITE_OTHER): Payer: Self-pay

## 2023-08-07 ENCOUNTER — Ambulatory Visit (HOSPITAL_BASED_OUTPATIENT_CLINIC_OR_DEPARTMENT_OTHER): Payer: Medicare Other

## 2023-08-09 ENCOUNTER — Ambulatory Visit (HOSPITAL_BASED_OUTPATIENT_CLINIC_OR_DEPARTMENT_OTHER)
Admission: RE | Admit: 2023-08-09 | Discharge: 2023-08-09 | Disposition: A | Discharge status: Home / Self Care | Payer: Medicare Other | Source: Ambulatory Visit | Attending: Cardiovascular Disease | Admitting: Cardiovascular Disease

## 2023-08-09 DIAGNOSIS — I739 Peripheral vascular disease, unspecified: Secondary | ICD-10-CM | POA: Diagnosis not present

## 2023-08-09 DIAGNOSIS — Q251 Coarctation of aorta: Secondary | ICD-10-CM | POA: Diagnosis not present

## 2023-08-09 DIAGNOSIS — Q600 Renal agenesis, unilateral: Secondary | ICD-10-CM | POA: Diagnosis not present

## 2023-08-09 DIAGNOSIS — I701 Atherosclerosis of renal artery: Secondary | ICD-10-CM | POA: Diagnosis not present

## 2023-08-09 DIAGNOSIS — E782 Mixed hyperlipidemia: Secondary | ICD-10-CM | POA: Insufficient documentation

## 2023-08-09 DIAGNOSIS — I700 Atherosclerosis of aorta: Secondary | ICD-10-CM | POA: Diagnosis not present

## 2023-08-09 MED ORDER — IOHEXOL 350 MG/ML SOLN
80.0000 mL | Freq: Once | INTRAVENOUS | Status: AC | PRN
  Administered 2023-08-09: 80 mL via INTRAVENOUS

## 2023-08-10 ENCOUNTER — Encounter: Payer: Self-pay | Admitting: Cardiovascular Disease

## 2023-08-23 ENCOUNTER — Encounter: Payer: Self-pay | Admitting: Cardiovascular Disease

## 2023-08-28 ENCOUNTER — Ambulatory Visit (HOSPITAL_BASED_OUTPATIENT_CLINIC_OR_DEPARTMENT_OTHER)
Admission: RE | Admit: 2023-08-28 | Discharge: 2023-08-28 | Disposition: A | Discharge status: Home / Self Care | Payer: Medicare Other | Source: Ambulatory Visit | Attending: Cardiovascular Disease | Admitting: Cardiovascular Disease

## 2023-08-28 ENCOUNTER — Ambulatory Visit (HOSPITAL_COMMUNITY)
Admission: RE | Admit: 2023-08-28 | Discharge: 2023-08-28 | Disposition: A | Discharge status: Home / Self Care | Payer: Medicare Other | Source: Ambulatory Visit | Attending: Cardiovascular Disease | Admitting: Cardiovascular Disease

## 2023-08-28 DIAGNOSIS — Q251 Coarctation of aorta: Secondary | ICD-10-CM | POA: Insufficient documentation

## 2023-08-28 DIAGNOSIS — I739 Peripheral vascular disease, unspecified: Secondary | ICD-10-CM

## 2023-08-29 LAB — VAS US ABI WITH/WO TBI
Left ABI: 0.87
Right ABI: 0.92

## 2023-09-05 ENCOUNTER — Telehealth: Payer: Self-pay | Admitting: Family Medicine

## 2023-09-05 ENCOUNTER — Other Ambulatory Visit: Payer: Self-pay

## 2023-09-05 DIAGNOSIS — I2510 Atherosclerotic heart disease of native coronary artery without angina pectoris: Secondary | ICD-10-CM

## 2023-09-05 MED ORDER — ATORVASTATIN CALCIUM 40 MG PO TABS
40.0000 mg | ORAL_TABLET | Freq: Every day | ORAL | 1 refills | Status: DC
Start: 2023-09-05 — End: 2024-02-19

## 2023-09-05 NOTE — Telephone Encounter (Signed)
 Prescription Request  09/05/2023  LOV: 06/25/2023  What is the name of the medication or equipment?   atorvastatin (LIPITOR) 40 MG tablet [[id number]]   Have you contacted your pharmacy to request a refill? Yes   Which pharmacy would you like this sent to?  [id number] [place], [place] - [address] [place] [address] Phone: [phone] Fax: [phone]    Patient notified that their request is being sent to the clinical staff for review and that they should receive a response within 2 business days.   Please advise pharmacist.

## 2023-09-20 ENCOUNTER — Encounter (INDEPENDENT_AMBULATORY_CARE_PROVIDER_SITE_OTHER): Payer: Self-pay

## 2023-09-20 ENCOUNTER — Ambulatory Visit (INDEPENDENT_AMBULATORY_CARE_PROVIDER_SITE_OTHER): Payer: Medicare Other | Admitting: Audiology

## 2023-09-20 ENCOUNTER — Ambulatory Visit (INDEPENDENT_AMBULATORY_CARE_PROVIDER_SITE_OTHER): Payer: Medicare Other | Admitting: Otolaryngology

## 2023-09-20 VITALS — BP 165/75 | HR 100 | Ht 59.0 in | Wt 121.0 lb

## 2023-09-20 DIAGNOSIS — H903 Sensorineural hearing loss, bilateral: Secondary | ICD-10-CM

## 2023-09-20 DIAGNOSIS — H7293 Unspecified perforation of tympanic membrane, bilateral: Secondary | ICD-10-CM

## 2023-09-20 DIAGNOSIS — H6123 Impacted cerumen, bilateral: Secondary | ICD-10-CM | POA: Diagnosis not present

## 2023-09-20 MED ORDER — OFLOXACIN 0.3 % OT SOLN
4.0000 [drp] | Freq: Two times a day (BID) | OTIC | 1 refills | Status: AC
Start: 2023-09-20 — End: 2023-09-25

## 2023-09-20 NOTE — Progress Notes (Signed)
  [address] [place], [place] [address] [phone]  Audiological Evaluation    Name: Brittany Kirk     DOB:   03/05/1946      MRN:   [id number]                                                                                     Service Date: 09/20/2023     Accompanied by: unaccompanied to the booth   Patient comes today after Dr. Allena Katz, ENT sent a referral for a hearing evaluation due to concerns with hearing loss.   Symptoms Yes Details  Hearing loss  [x]  Yes, if people are at a distance  Tinnitus  []    Ear pain/ infections/pressure  [x]  Uses plugs when showering because of concerns with TM perforations in both ears  Balance problems  []    Noise exposure history  []    Previous ear surgeries  []    Family history of hearing loss  []    Amplification  []    Other  []      Otoscopy: Right ear: Abnormal eardrum appearance. Possible retraction pockets Left ear:  Abnormal eardrum appearance. Possible retraction pockets  Tympanometry: Right ear: Type A- Normal external ear canal volume with normal middle ear pressure and tympanic membrane compliance Left ear: Type A- Normal external ear canal volume with normal middle ear pressure and tympanic membrane compliance  Pure tone Audiometry: Right ear- Borderline normal to moderately severe sensorineural hearing loss from 125 Hz - 8000 Hz. Left ear-  Mild to severe sensorineural hearing loss from 125 Hz - 8000 Hz.  Speech Audiometry: Right ear- Speech Reception Threshold (SRT) was obtained at 40 dBHL. Left ear-Speech Reception Threshold (SRT) was obtained at 50 dBHL.   Word Recognition Score Tested using NU-6 (MLV) Right ear: 84% was obtained at a presentation level of 75 dBHL with contralateral masking which is deemed as  good . Left ear: 80% was obtained at a presentation level of 85 dBHL with contralateral masking which is deemed as  good .   The hearing test results were completed under headphones and results are  deemed to be of fair reliability. Test technique:  conventional      Recommendations: Follow up with ENT as scheduled for today. Return for a hearing evaluation if concerns with hearing changes arise or per MD recommendation. Consider a communication needs assessment after medical clearance for hearing aids is obtained.   Brittany Kirk MARIE LEROUX-MARTINEZ, AUD

## 2023-09-20 NOTE — Progress Notes (Signed)
 Dear Dr. Cheril Cork, Here is my assessment for our mutual patient, Brittany Kirk. Thank you for allowing me the opportunity to care for your patient. Please do not hesitate to contact me should you have any other questions. Sincerely, Dr. Milon Aloe  Otolaryngology Clinic Note Referring provider: Dr. Cheril Cork HPI:  Brittany Kirk is a 78 y.o. female kindly referred by Dr. Cheril Cork for evaluation of cerumen impaction  Initial visit (07/2023): Patient reports: b/l cerumen impaction and issues with wax; she reports she has had history of b/l TM perforation, and thus cannot get irrigated; some fullness b/l and longstanding hearing loss. Ear infections as a child Patient denies: ear pain,vertigo, drainage Patient additionally denies: deep pain in ear canal, eustachian tube symptoms such as popping Patient also denies barotrauma, vestibular suppressant use, ototoxic medication use Prior ear surgery: ear tubes as child  --------------------------------------------------------- 09/20/2023 Returns for follow up. No issues with ears currently except b/l HL and some fullness. No drainage or ear pain.  H&N Surgery: T&A, Ear tubes Personal or FHx of bleeding dz or anesthesia difficulty: no   GLP-1: no AP/AC: Eliquis   Tobacco: quit, prior smoker  PMHx: HTN, GERD, A-fib on Apixaban  (s/p ablation), b/l ICA stenosis, PAD  Independent Review of Additional Tests or Records:  Dr. Cheril Cork (IM) (PCP) 1//14/2025: noted b/l cerumen impactions and hearing loss; no other ear issues; Rx: ref to [facility] 07/06/2016 independently interpreted with respect to sinuses and ears: mastoids and ME well aerated b/l; cuts thick so cannot note any perforation; no significant paranasal sinus disease; minimal left ethmoid disease 09/2023 Audiogram was independently reviewed and interpreted by me and it reveals - b/l essentially symmetric downsloping SNHL with WRT >80% AU at 75 and 85dB AD and AS respectively; A/A tymps  SNHL=  Sensorineural hearing loss   PMH/Meds/All/SocHx/FamHx/ROS:   Past Medical History:  Diagnosis Date   Acid reflux    Allergic rhinitis    Anxiety    Asymptomatic bilateral carotid artery stenosis    40-59% (02/2018)   Atrial fibrillation (HCC) 07/2019   Breast cancer (HCC) 2015   Right Breast Cancer   CAD (coronary artery disease)    CKD (chronic kidney disease), stage III (HCC)    Depression    Emphysema    no longer uses inhalers   Gallstones    nausea, pain upper right abdomen   GERD (gastroesophageal reflux disease)    H/O hiatal hernia    Hiatal hernia    History of skin cancer    Hyperlipidemia    Hypertension    Multinodular goiter (nontoxic)    Scoliosis    Skin cancer    Tobacco user    Wears dentures    top     Past Surgical History:  Procedure Laterality Date   ABDOMINAL AORTOGRAM W/LOWER EXTREMITY N/A 02/05/2022   Procedure: ABDOMINAL AORTOGRAM W/LOWER EXTREMITY;  Surgeon: Avanell Leigh, MD;  Location: [facility] INVASIVE CV LAB;  Service: Cardiovascular;  Laterality: N/A;   ATRIAL FIBRILLATION ABLATION N/A 04/14/2021   Procedure: ATRIAL FIBRILLATION ABLATION;  Surgeon: Lei Pump, MD;  Location: [facility] INVASIVE CV LAB;  Service: Cardiovascular;  Laterality: N/A;   BREAST LUMPECTOMY Right 2015   CHOLECYSTECTOMY  07/08/2012   Procedure: LAPAROSCOPIC CHOLECYSTECTOMY WITH INTRAOPERATIVE CHOLANGIOGRAM;  Surgeon: Fran Imus, MD,FACS;  Location: [facility] ORS;  Service: General;  Laterality: N/A;  Laparoscopic Cholecystectomy with Intraoperative Cholangiogram   COLONOSCOPY     ESOPHAGOSCOPY N/A 05/29/2017   Procedure: ESOPHAGOSCOPY;  Surgeon: Ozell Blunt, MD;  Location: [facility] ENDOSCOPY;  Service: Endoscopy;  Laterality: N/A;  botox  injection   HAND SURGERY Left    wrist   LEFT HEART CATH AND CORONARY ANGIOGRAPHY N/A 10/11/2017   Procedure: LEFT HEART CATH AND CORONARY ANGIOGRAPHY;  Surgeon: [place]az[place], Peter M, MD;  Location: [facility] INVASIVE CV LAB;  Service: Cardiovascular;   Laterality: N/A;   TONSILECTOMY, ADENOIDECTOMY, BILATERAL MYRINGOTOMY AND TUBES     TUBAL LIGATION      Family History  Problem Relation Age of Onset   Arthritis Mother    Pancreatic cancer Mother    Heart disease Other        5 brothers and 2 sister   Diverticulitis Sister    Heart disease Sister    Aplastic anemia Other      Social Connections: Unknown (06/24/2023)   Social Connection and Isolation Panel [NHANES]    Frequency of Communication with Friends and Family: Not on file    Frequency of Social Gatherings with Friends and Family: Once a week    Attends Religious Services: Not on Marketing executive or Organizations: No    Attends Engineer, structural: Not on file    Marital Status: Married      Current Outpatient Medications:    acetaminophen  (TYLENOL ) 500 MG tablet, Take 1,000 mg by mouth every 6 (six) hours as needed for moderate pain or headache., Disp: , Rfl:    apixaban  (ELIQUIS ) 5 MG TABS tablet, Take 1 tablet (5 mg total) by mouth 2 (two) times daily., Disp: 180 tablet, Rfl: 1   ascorbic acid  (VITAMIN C) 1000 MG tablet, Take 1,000 mg by mouth daily., Disp: , Rfl:    atorvastatin  (LIPITOR) 40 MG tablet, Take 1 tablet (40 mg total) by mouth daily., Disp: 90 tablet, Rfl: 1   Cholecalciferol  (VITAMIN D ) 125 MCG (5000 UT) CAPS, Take 5,000 Units by mouth daily., Disp: , Rfl:    diazepam  (VALIUM ) 5 MG tablet, Take 5 mg by mouth as needed for sedation., Disp: , Rfl:    omeprazole (PRILOSEC) 20 MG capsule, Take 20 mg by mouth daily., Disp: , Rfl:    valsartan  (DIOVAN ) 40 MG tablet, Take 1 tablet (40 mg total) by mouth daily. Hold for Blood Pressure <120, Disp: 90 tablet, Rfl: 3   Physical Exam:   BP (!) 165/75 (BP Location: Left Arm, Patient Position: Sitting, Cuff Size: Normal)   Pulse 100   Ht 4\' 11"  (1.499 m)   Wt 121 lb (54.9 kg)   SpO2 90%   BMI 24.44 kg/m   Salient findings:  CN II-XII intact Given history and complaints, ear microscopy  was indicated and performed for evaluation with findings as below in physical exam section and in procedures Right continued mild-modest pars flaccida retraction, diffuse myringosclerosis, posterior monomeric part but don't see perforation; there is maybe some focal type 3 myringoincudopexy; left TM is intact, small monomeric area anterior; no epithelial debris appreciated Weber 512: mid Rinne 512: AC > BC b/l    Seprately Identifiable Procedures:  Procedure: Bilateral ear microscopy using microscope (CPT [id number]) Pre-procedure diagnosis: concern for tympanic membrane perforation, bilateral hearing loss Post-procedure diagnosis: same Indication: see above; given patient's otologic complaints and history, for improved and comprehensive examination of external ear and tympanic membrane, bilateral otologic examination using microscope was performed  Procedure: Patient was placed semi-recumbent. Both ear canals were examined using the microscope with findings above. Patient tolerated the procedure well.   Impression & Plans:  Hinda  Stucky is a 78 y.o. female with:  1. Bilateral sensorineural hearing loss   2. Tympanic membrane perforation, bilateral   3. Ceruminosis, bilateral    Concern for b/l TM perforation which she reports long-standing but on exam and tymps today, no perforation appreciated. She does have some b/l myringosclerosis and modest retraction but no evidence of cholesteatoma.  For ceruminosis, she can use ofloxacin  drops BID x7d if has recurrence; can also use baby oil once weekly  Otherwise, Would recommend observation currently. She is invited to call if there are any questions or concerns or changes in hearing; happy to see her back in 1 year with audio or as needed  See below regarding exact medications prescribed this encounter including dosages and route: Meds ordered this encounter  Medications   ofloxacin  (FLOXIN ) 0.3 % OTIC solution    Sig: Place 4 drops into  both ears 2 (two) times daily for 5 days.    Dispense:  10 mL    Refill:  1      Thank you for allowing me the opportunity to care for your patient. Please do not hesitate to contact me should you have any other questions.  Sincerely, Milon Aloe, MD Otolaryngologist (ENT), [facility] Phone: [phone] Fax: [phone]  10/07/2023, 9:08 PM   MDM:  Level 3 - 99214 Complexity/Problems addressed: mod - multiple problems Data complexity: low - Morbidity: mod - Prescription Drug prescribed or managed: yes

## 2023-10-11 ENCOUNTER — Emergency Department (HOSPITAL_COMMUNITY)

## 2023-10-11 ENCOUNTER — Encounter (HOSPITAL_COMMUNITY): Payer: Self-pay | Admitting: Emergency Medicine

## 2023-10-11 ENCOUNTER — Emergency Department (HOSPITAL_COMMUNITY)
Admission: EM | Admit: 2023-10-11 | Discharge: 2023-10-11 | Disposition: A | Discharge status: Home / Self Care | Attending: Emergency Medicine | Admitting: Emergency Medicine

## 2023-10-11 ENCOUNTER — Other Ambulatory Visit: Payer: Self-pay

## 2023-10-11 DIAGNOSIS — Z91040 Latex allergy status: Secondary | ICD-10-CM | POA: Insufficient documentation

## 2023-10-11 DIAGNOSIS — Z7901 Long term (current) use of anticoagulants: Secondary | ICD-10-CM | POA: Diagnosis not present

## 2023-10-11 DIAGNOSIS — M7989 Other specified soft tissue disorders: Secondary | ICD-10-CM | POA: Diagnosis not present

## 2023-10-11 DIAGNOSIS — Z79899 Other long term (current) drug therapy: Secondary | ICD-10-CM | POA: Insufficient documentation

## 2023-10-11 DIAGNOSIS — R0609 Other forms of dyspnea: Secondary | ICD-10-CM | POA: Insufficient documentation

## 2023-10-11 DIAGNOSIS — I10 Essential (primary) hypertension: Secondary | ICD-10-CM | POA: Insufficient documentation

## 2023-10-11 DIAGNOSIS — I499 Cardiac arrhythmia, unspecified: Secondary | ICD-10-CM | POA: Diagnosis not present

## 2023-10-11 DIAGNOSIS — I2510 Atherosclerotic heart disease of native coronary artery without angina pectoris: Secondary | ICD-10-CM | POA: Diagnosis not present

## 2023-10-11 DIAGNOSIS — R350 Frequency of micturition: Secondary | ICD-10-CM | POA: Diagnosis not present

## 2023-10-11 DIAGNOSIS — R0789 Other chest pain: Secondary | ICD-10-CM | POA: Diagnosis not present

## 2023-10-11 DIAGNOSIS — R0902 Hypoxemia: Secondary | ICD-10-CM | POA: Diagnosis not present

## 2023-10-11 DIAGNOSIS — I700 Atherosclerosis of aorta: Secondary | ICD-10-CM | POA: Insufficient documentation

## 2023-10-11 DIAGNOSIS — R079 Chest pain, unspecified: Secondary | ICD-10-CM | POA: Diagnosis not present

## 2023-10-11 DIAGNOSIS — I4891 Unspecified atrial fibrillation: Secondary | ICD-10-CM | POA: Insufficient documentation

## 2023-10-11 LAB — CBC WITH DIFFERENTIAL/PLATELET
Abs Immature Granulocytes: 0.03 10*3/uL (ref 0.00–0.07)
Basophils Absolute: 0 10*3/uL (ref 0.0–0.1)
Basophils Relative: 0 %
Eosinophils Absolute: 0.1 10*3/uL (ref 0.0–0.5)
Eosinophils Relative: 1 %
HCT: 33.4 % — ABNORMAL LOW (ref 36.0–46.0)
Hemoglobin: 10.8 g/dL — ABNORMAL LOW (ref 12.0–15.0)
Immature Granulocytes: 1 %
Lymphocytes Relative: 38 %
Lymphs Abs: 2.4 10*3/uL (ref 0.7–4.0)
MCH: 33.1 pg (ref 26.0–34.0)
MCHC: 32.3 g/dL (ref 30.0–36.0)
MCV: 102.5 fL — ABNORMAL HIGH (ref 80.0–100.0)
Monocytes Absolute: 0.5 10*3/uL (ref 0.1–1.0)
Monocytes Relative: 8 %
Neutro Abs: 3.2 10*3/uL (ref 1.7–7.7)
Neutrophils Relative %: 52 %
Platelets: 209 10*3/uL (ref 150–400)
RBC: 3.26 MIL/uL — ABNORMAL LOW (ref 3.87–5.11)
RDW: 13.7 % (ref 11.5–15.5)
WBC: 6.2 10*3/uL (ref 4.0–10.5)
nRBC: 0 % (ref 0.0–0.2)

## 2023-10-11 LAB — COMPREHENSIVE METABOLIC PANEL WITH GFR
ALT: 15 U/L (ref 0–44)
AST: 18 U/L (ref 15–41)
Albumin: 2.9 g/dL — ABNORMAL LOW (ref 3.5–5.0)
Alkaline Phosphatase: 35 U/L — ABNORMAL LOW (ref 38–126)
Anion gap: 5 (ref 5–15)
BUN: 27 mg/dL — ABNORMAL HIGH (ref 8–23)
CO2: 25 mmol/L (ref 22–32)
Calcium: 8.4 mg/dL — ABNORMAL LOW (ref 8.9–10.3)
Chloride: 110 mmol/L (ref 98–111)
Creatinine, Ser: 1.06 mg/dL — ABNORMAL HIGH (ref 0.44–1.00)
GFR, Estimated: 53 mL/min — ABNORMAL LOW (ref >60)
Glucose, Bld: 105 mg/dL — ABNORMAL HIGH (ref 70–99)
Potassium: 3.9 mmol/L (ref 3.5–5.1)
Sodium: 140 mmol/L (ref 135–145)
Total Bilirubin: 0.6 mg/dL (ref 0.0–1.2)
Total Protein: 5.3 g/dL — ABNORMAL LOW (ref 6.5–8.1)

## 2023-10-11 LAB — MAGNESIUM: Magnesium: 2.2 mg/dL (ref 1.7–2.4)

## 2023-10-11 LAB — TROPONIN I (HIGH SENSITIVITY)
Troponin I (High Sensitivity): 12 ng/L (ref <18)
Troponin I (High Sensitivity): 20 ng/L — ABNORMAL HIGH (ref <18)

## 2023-10-11 LAB — BRAIN NATRIURETIC PEPTIDE: B Natriuretic Peptide: 167.4 pg/mL — ABNORMAL HIGH (ref 0.0–100.0)

## 2023-10-11 LAB — LIPASE, BLOOD: Lipase: 34 U/L (ref 11–51)

## 2023-10-11 LAB — D-DIMER, QUANTITATIVE: D-Dimer, Quant: <0.27 ug{FEU}/mL (ref 0.00–0.50)

## 2023-10-11 MED ORDER — NITROGLYCERIN 0.4 MG SL SUBL
0.4000 mg | SUBLINGUAL_TABLET | SUBLINGUAL | Status: DC | PRN
  Administered 2023-10-11: 0.4 mg via SUBLINGUAL
  Filled 2023-10-11: qty 1

## 2023-10-11 NOTE — ED Provider Notes (Signed)
 [facility] EMERGENCY DEPARTMENT AT [facility] Provider Note   CSN: [id number] Arrival date & time: 10/11/23  0255     History  Chief Complaint  Patient presents with   Chest Pain    Brittany Kirk is a 78 y.o. female.  78 yo F w/ h/o Afib s/p ablation, nonobstructive CAD w/o stent, aortic atherosclerosis w/o intervention, HTN who present sto the ED secondary to cp/DOE. Patient in normal state of health tonight. Went to bed around 2130. Couldn't sleep because patient kept having to get up and urinate. At some point around 0100 she started having chest pressure which worsened with ambulation and then would have dyspnea when walking as well. This progressed to start having discomfort in left shoulder, arm and subscapular area. No fever or cough. Patient with elevated blood pressure. Had some palpitations as well and states she has a h/o Afib but not since January. No recent illnesses. No other associated symptoms.    Chest Pain      Home Medications Prior to Admission medications   Medication Sig Start Date End Date Taking? Authorizing Provider  acetaminophen  (TYLENOL ) 500 MG tablet Take 1,000 mg by mouth every 6 (six) hours as needed for moderate pain or headache.    [provider]  apixaban  (ELIQUIS ) 5 MG TABS tablet Take 1 tablet (5 mg total) by mouth 2 (two) times daily. 11/06/22   Avanell Leigh, MD  ascorbic acid  (VITAMIN C) 1000 MG tablet Take 1,000 mg by mouth daily.    [provider]  atorvastatin  (LIPITOR) 40 MG tablet Take 1 tablet (40 mg total) by mouth daily. 09/05/23   Austine Lefort, MD  Cholecalciferol  (VITAMIN D ) 125 MCG (5000 UT) CAPS Take 5,000 Units by mouth daily.    [provider]  diazepam  (VALIUM ) 5 MG tablet Take 5 mg by mouth as needed for sedation. 12/13/20   [provider]  omeprazole (PRILOSEC) 20 MG capsule Take 20 mg by mouth daily.    [provider]  valsartan  (DIOVAN ) 40 MG tablet Take 1  tablet (40 mg total) by mouth daily. Hold for Blood Pressure <120 01/05/22   Debbie Fails, PA-C      Allergies    Penicillins, Fosamax  [alendronate ], Levofloxacin , Mexiletine, Trimethoprim, Cefaclor, Latex, Metronidazole, Naproxen, Nsaids, Septra [bactrim], Sulfonamide derivatives, Tape, and Wound dressing adhesive    Review of Systems   Review of Systems  Cardiovascular:  Positive for chest pain.    Physical Exam Updated Vital Signs BP (!) 180/56 (BP Location: Left Arm)   Pulse 65   Temp 97.7 F (36.5 C) (Oral)   Resp 20   Ht 4\' 11"  (1.499 m)   Wt 52.2 kg   SpO2 96%   BMI 23.23 kg/m  Physical Exam Vitals and nursing note reviewed.  Constitutional:      Appearance: She is well-developed.  HENT:     Head: Normocephalic and atraumatic.  Cardiovascular:     Rate and Rhythm: Normal rate. Rhythm irregular.  Pulmonary:     Effort: No respiratory distress.     Breath sounds: No stridor.  Abdominal:     General: There is no distension.  Musculoskeletal:     Cervical back: Normal range of motion.  Neurological:     Mental Status: She is alert.     ED Results / Procedures / Treatments   Labs (all labs ordered are listed, but only abnormal results are displayed) Labs Reviewed  COMPREHENSIVE METABOLIC PANEL WITH  GFR - Abnormal; Notable for the following components:      Result Value   Glucose, Bld 105 (*)    BUN 27 (*)    Creatinine, Ser 1.06 (*)    Calcium  8.4 (*)    Total Protein 5.3 (*)    Albumin 2.9 (*)    Alkaline Phosphatase 35 (*)    GFR, Estimated 53 (*)    All other components within normal limits  BRAIN NATRIURETIC PEPTIDE - Abnormal; Notable for the following components:   B Natriuretic Peptide 167.4 (*)    All other components within normal limits  CBC WITH DIFFERENTIAL/PLATELET - Abnormal; Notable for the following components:   RBC 3.26 (*)    Hemoglobin 10.8 (*)    HCT 33.4 (*)    MCV 102.5 (*)    All other components within normal limits   TROPONIN I (HIGH SENSITIVITY) - Abnormal; Notable for the following components:   Troponin I (High Sensitivity) 20 (*)    All other components within normal limits  MAGNESIUM   LIPASE, BLOOD  D-DIMER, QUANTITATIVE  CBG MONITORING, ED  TROPONIN I (HIGH SENSITIVITY)    EKG EKG Interpretation Date/Time:  Friday Oct 11 2023 03:02:27 EDT Ventricular Rate:  97 PR Interval:    QRS Duration:  91 QT Interval:  362 QTC Calculation: 460 R Axis:   -4  Text Interpretation: Atrial fibrillation Minimal ST depression, diffuse leads Confirmed by Eve Hinders [phone]) on 10/11/2023 3:34:12 AM   EKG Interpretation Date/Time:  Friday Oct 11 2023 03:56:10 EDT Ventricular Rate:  57 PR Interval:  152 QRS Duration:  86 QT Interval:  426 QTC Calculation: 415 R Axis:   16  Text Interpretation: Sinus rhythm Confirmed by Eve Hinders [phone]) on 10/11/2023 11:35:22 PM         Radiology DG Chest Portable 1 View Result Date: 10/11/2023 EXAM: 1 VIEW XRAY OF THE CHEST 10/11/2023 03:33:44 AM COMPARISON: 06/17/2021 CLINICAL HISTORY: Chest pain FINDINGS: LUNGS AND PLEURA: No consolidation. No pulmonary edema. No pleural effusion. No pneumothorax. HEART AND MEDIASTINUM: No acute abnormality of the cardiac and mediastinal silhouettes. Thoracic aortic atherosclerosis. BONES AND SOFT TISSUES: No acute osseous abnormality. Surgical clips along the right lateral chest wall / axilla. IMPRESSION: 1. No acute findings. Electronically signed by: Zadie Herter MD 10/11/2023 03:44 AM EDT RP Workstation: [id number]    Procedures Procedures    Medications Ordered in ED Medications - No data to display  ED Course/ Medical Decision Making/ A&P                                Medical Decision Making Amount and/or Complexity of Data Reviewed Labs: ordered. Radiology: ordered.   Patient's symptoms improved when she converted back to sinus rhythm. Suspect the mild bump in troponin was from the afib rather than  true ACS. Patient will follow up with her primary cardiologist for further management and possibly going back on a beta blocker.  Final Clinical Impression(s) / ED Diagnoses Final diagnoses:  Atrial fibrillation, unspecified type [facility])    Rx / DC Orders ED Discharge Orders     None         Gale Klar, Reymundo Caulk, MD 10/11/23 [phone]

## 2023-10-11 NOTE — ED Triage Notes (Addendum)
 BIB [facility] CP radiating to left arm that started at approx 0100. Afib on arrival of EMS with RVR occasionally. Urinary frequency, bilat feet swelling. 324 mg aspirin  en route, saline bolus 500.   Rt arm restricted.   170/70 90-130 HR 96% RA 22 LAC

## 2023-10-14 ENCOUNTER — Telehealth: Payer: Self-pay

## 2023-10-14 NOTE — Transitions of Care (Post Inpatient/ED Visit) (Signed)
 10/14/2023  Name: Brittany Kirk MRN: [id number] DOB: 05-13-1946  Today's TOC FU Call Status: Today's TOC FU Call Status:: Successful TOC FU Call Completed TOC FU Call Complete Date: 10/14/23 Patient's Name and Date of Birth confirmed.  Transition Care Management Follow-up Telephone Call Date of Discharge: 10/11/23 Discharge Facility: [facility]) Type of Discharge: Emergency Department Reason for ED Visit: Other:, Cardiac Conditions Cardiac Conditions Diagnosis: Atrial Fibrillation How have you been since you were released from the hospital?: Better Any questions or concerns?: No  Items Reviewed: Did you receive and understand the discharge instructions provided?: No Medications obtained,verified, and reconciled?: Yes (Medications Reviewed) Any new allergies since your discharge?: No Dietary orders reviewed?: Yes Do you have support at home?: Yes People in Home [RPT]: spouse  Medications Reviewed Today: Medications Reviewed Today     Reviewed by Darrall Ellison, LPN (Licensed Practical Nurse) on 10/14/23 at 1522  Med List Status: <None>   Medication Order Taking? Sig Documenting Provider Last Dose Status Informant  acetaminophen  (TYLENOL ) 500 MG tablet [id number] No Take 1,000 mg by mouth every 6 (six) hours as needed for moderate pain or headache. [provider] Taking Active Self           Med Note Cosette Dinning, Arlie Benedict   Wed Jul 31, 2023  9:44 AM) Pt takes as needed.  apixaban  (ELIQUIS ) 5 MG TABS tablet [id number] No Take 1 tablet (5 mg total) by mouth 2 (two) times daily. Avanell Leigh, MD Taking Active   ascorbic acid  (VITAMIN C) 1000 MG tablet [id number] No Take 1,000 mg by mouth daily. [provider] Taking Active Self  atorvastatin  (LIPITOR) 40 MG tablet [id number] No Take 1 tablet (40 mg total) by mouth daily. Austine Lefort, MD Taking Active   Cholecalciferol  (VITAMIN D ) 125 MCG (5000 UT) CAPS [id number] No Take 5,000 Units by mouth daily.  [provider] Taking Active Self           Med Note Cosette Dinning, Arlie Benedict   Wed Jul 31, 2023  9:44 AM) Pt takes as needed.  diazepam  (VALIUM ) 5 MG tablet [id number] No Take 5 mg by mouth as needed for sedation. [provider] Taking Active Self  omeprazole (PRILOSEC) 20 MG capsule [id number] No Take 20 mg by mouth daily. [provider] Taking Active   valsartan  (DIOVAN ) 40 MG tablet [id number] No Take 1 tablet (40 mg total) by mouth daily. Hold for Blood Pressure <120 Debbie Fails, PA-C Taking Active Self           Med Note Thaddeus Filippo, Ancel Kass   Tue Sep 11, 2022  2:08 PM) Uses as needed.            Home Care and Equipment/Supplies: Were Home Health Services Ordered?: NA Any new equipment or medical supplies ordered?: NA  Functional Questionnaire: Do you need assistance with bathing/showering or dressing?: No Do you need assistance with meal preparation?: No Do you need assistance with eating?: No Do you have difficulty maintaining continence: No Do you need assistance with getting out of bed/getting out of a chair/moving?: No Do you have difficulty managing or taking your medications?: No  Follow up appointments reviewed: PCP Follow-up appointment confirmed?: No (declined) Specialist Hospital Follow-up appointment confirmed?: No Reason Specialist Follow-Up Not Confirmed: Patient has Specialist Provider Number and will Call for Appointment Do you need transportation to your follow-up appointment?: No Do you understand care options if your condition(s) worsen?: Yes-patient verbalized understanding  SIGNATURE Darrall Ellison, LPN [facility] Nurse Health Advisor Direct Dial [phone]

## 2023-10-21 ENCOUNTER — Encounter: Payer: Self-pay | Admitting: Family Medicine

## 2023-10-21 ENCOUNTER — Ambulatory Visit (INDEPENDENT_AMBULATORY_CARE_PROVIDER_SITE_OTHER): Admitting: Family Medicine

## 2023-10-21 ENCOUNTER — Telehealth: Payer: Self-pay

## 2023-10-21 VITALS — BP 118/62 | HR 62 | Temp 97.5°F | Ht 59.0 in | Wt 118.0 lb

## 2023-10-21 DIAGNOSIS — D649 Anemia, unspecified: Secondary | ICD-10-CM

## 2023-10-21 DIAGNOSIS — I701 Atherosclerosis of renal artery: Secondary | ICD-10-CM | POA: Diagnosis not present

## 2023-10-21 DIAGNOSIS — R770 Abnormality of albumin: Secondary | ICD-10-CM

## 2023-10-21 DIAGNOSIS — R7989 Other specified abnormal findings of blood chemistry: Secondary | ICD-10-CM | POA: Diagnosis not present

## 2023-10-21 NOTE — Progress Notes (Signed)
 Subjective:    Patient ID: Brittany Kirk, female    DOB: [birth year], 78 y.o.   MRN: [id number]  Brittany Kirk is a 78 y.o. female who went to the emergency room on May 2 with atrial fibrillation.  In the emergency room, troponin was elevated to 20.  BNP slightly above 100.  Hemoglobin had dropped from her baseline of 12.2-10.8.  MCV was mildly elevated despite the fact the patient is on a B12 supplement.  Patient also had a CT angiogram performed in February which showed a mild to moderate stenosis of the right renal artery along with a significant atherosclerotic plaque burden throughout the aorta.  Patient sees her cardiologist who has recommended consultation with the vascular surgery to discuss bypass.  However the right renal lesion is not clinically significant based on the level of stenosis.  She is here today to discuss these lab abnormalities.  She was also concerned about her low albumin level and low total protein level.  Past Medical History:  Diagnosis Date   Acid reflux    Allergic rhinitis    Anxiety    Asymptomatic bilateral carotid artery stenosis    40-59% (02/2018)   Atrial fibrillation (HCC) 07/2019   Breast cancer (HCC) 2015   Right Breast Cancer   CAD (coronary artery disease)    CKD (chronic kidney disease), stage III (HCC)    Depression    Emphysema    no longer uses inhalers   Gallstones    nausea, pain upper right abdomen   GERD (gastroesophageal reflux disease)    H/O hiatal hernia    Hiatal hernia    History of skin cancer    Hyperlipidemia    Hypertension    Multinodular goiter (nontoxic)    Scoliosis    Skin cancer    Tobacco user    Wears dentures    top   Past Surgical History:  Procedure Laterality Date   ABDOMINAL AORTOGRAM W/LOWER EXTREMITY N/A 02/05/2022   Procedure: ABDOMINAL AORTOGRAM W/LOWER EXTREMITY;  Surgeon: Avanell Leigh, MD;  Location: [facility];  Service: Cardiovascular;  Laterality: N/A;   ATRIAL FIBRILLATION  ABLATION N/A 04/14/2021   Procedure: ATRIAL FIBRILLATION ABLATION;  Surgeon: Lei Pump, MD;  Location: [facility];  Service: Cardiovascular;  Laterality: N/A;   BREAST LUMPECTOMY Right 2015   CHOLECYSTECTOMY  07/08/2012   Procedure: LAPAROSCOPIC CHOLECYSTECTOMY WITH INTRAOPERATIVE CHOLANGIOGRAM;  Surgeon: Fran Imus, MD,FACS;  Location: [facility];  Service: General;  Laterality: N/A;  Laparoscopic Cholecystectomy with Intraoperative Cholangiogram   COLONOSCOPY     ESOPHAGOSCOPY N/A 05/29/2017   Procedure: ESOPHAGOSCOPY;  Surgeon: Ozell Blunt, MD;  Location: [facility] ENDOSCOPY;  Service: Endoscopy;  Laterality: N/A;  botox  injection   HAND SURGERY Left    wrist   LEFT HEART CATH AND CORONARY ANGIOGRAPHY N/A 10/11/2017   Procedure: LEFT HEART CATH AND CORONARY ANGIOGRAPHY;  Surgeon: Swaziland, Peter M, MD;  Location: [facility] INVASIVE CV LAB;  Service: Cardiovascular;  Laterality: N/A;   TONSILECTOMY, ADENOIDECTOMY, BILATERAL MYRINGOTOMY AND TUBES     TUBAL LIGATION     Current Outpatient Medications on File Prior to Visit  Medication Sig Dispense Refill   acetaminophen  (TYLENOL ) 500 MG tablet Take 1,000 mg by mouth every 6 (six) hours as needed for moderate pain or headache.     apixaban  (ELIQUIS ) 5 MG TABS tablet Take 1 tablet (5 mg total) by mouth 2 (two) times daily. 180 tablet 1   ascorbic acid  (  VITAMIN C) 1000 MG tablet Take 1,000 mg by mouth daily.     atorvastatin  (LIPITOR) 40 MG tablet Take 1 tablet (40 mg total) by mouth daily. 90 tablet 1   diazepam  (VALIUM ) 5 MG tablet Take 5 mg by mouth as needed for sedation.     valsartan  (DIOVAN ) 40 MG tablet Take 1 tablet (40 mg total) by mouth daily. Hold for Blood Pressure <120 90 tablet 3   omeprazole (PRILOSEC) 20 MG capsule Take 20 mg by mouth daily. (Patient not taking: Reported on 10/21/2023)     No current facility-administered medications on file prior to visit.   Allergies  Allergen Reactions   Penicillins Anaphylaxis, Itching,  Swelling and Rash    Has patient had a PCN reaction causing immediate rash, facial/tongue/throat swelling, SOB or lightheadedness with hypotension: Yes Has patient had a PCN reaction causing severe rash involving mucus membranes or skin necrosis: No Has patient had a PCN reaction that required hospitalization: Yes Has patient had a PCN reaction occurring within the last 10 years: No If all of the above answers are "NO", then may proceed with Cephalosporin use.    Fosamax  [Alendronate ]     myalgias   Levofloxacin  Nausea Only and Other (See Comments)    Causes headache, fatigue and dizziness   Mexiletine Itching, Nausea Only and Other (See Comments)   Trimethoprim    Cefaclor Itching, Swelling and Rash   Latex Itching and Rash   Metronidazole Itching, Swelling and Rash   Naproxen Itching, Swelling and Rash   Nsaids Itching, Swelling and Rash    Ibuprofen  IS tolerated   Septra [Bactrim] Itching, Swelling and Rash   Sulfonamide Derivatives Itching, Swelling and Rash   Tape Itching and Rash    ONLY USE PAPER TAPE   Wound Dressing Adhesive Itching and Rash   Social History   Socioeconomic History   Marital status: Married    Spouse name: Actor   Number of children: 1   Years of education: Not on file   Highest education level: Some college, no degree  Occupational History   Occupation: retired truck Nurse, learning disability: RETIRED  Tobacco Use   Smoking status: Former    Current packs/day: 0.00    Average packs/day: 0.5 packs/day for 46.0 years (23.0 ttl pk-yrs)    Types: Cigarettes    Start date: 02/12/1965    Quit date: 02/13/2011    Years since quitting: 12.6   Smokeless tobacco: Never  Vaping Use   Vaping status: Never Used  Substance and Sexual Activity   Alcohol use: No   Drug use: No   Sexual activity: Not on file  Other Topics Concern   Not on file  Social History Narrative   Married since 1991, second marriage.    1 son   1 grandson   1 granddaughter   2  step children   3 step grandchildren   Social Drivers of Corporate investment banker Strain: Low Risk  (06/24/2023)   Overall Financial Resource Strain (CARDIA)    Difficulty of Paying Living Expenses: Not very hard  Food Insecurity: No Food Insecurity (06/24/2023)   Hunger Vital Sign    Worried About Running Out of Food in the Last Year: Never true    Ran Out of Food in the Last Year: Never true  Transportation Needs: No Transportation Needs (06/24/2023)   PRAPARE - Administrator, Civil Service (Medical): No    Lack of Transportation (Non-Medical):  No  Physical Activity: Unknown (06/24/2023)   Exercise Vital Sign    Days of Exercise per Week: Patient declined    Minutes of Exercise per Session: Not on file  Stress: Stress Concern Present (06/24/2023)   [employer] of Occupational Health - Occupational Stress Questionnaire    Feeling of Stress : To some extent  Social Connections: Unknown (06/24/2023)   Social Connection and Isolation Panel [NHANES]    Frequency of Communication with Friends and Family: Not on file    Frequency of Social Gatherings with Friends and Family: Once a week    Attends Religious Services: Not on Insurance claims handler of Clubs or Organizations: No    Attends Banker Meetings: Not on file    Marital Status: Married  Intimate Partner Violence: Not At Risk (07/12/2022)   Humiliation, Afraid, Rape, and Kick questionnaire    Fear of Current or Ex-Partner: No    Emotionally Abused: No    Physically Abused: No    Sexually Abused: No     Review of Systems  All other systems reviewed and are negative.      Objective:   Physical Exam Vitals reviewed.  Constitutional:      General: She is not in acute distress.    Appearance: She is well-developed. She is not diaphoretic.  HENT:     Head: Normocephalic and atraumatic.  Neck:     Thyroid : No thyromegaly.     Vascular: No JVD.  Cardiovascular:     Rate and Rhythm: Normal  rate and regular rhythm.     Pulses:          Dorsalis pedis pulses are 1+ on the right side and 1+ on the left side.       Posterior tibial pulses are 1+ on the right side and 1+ on the left side.     Heart sounds: Normal heart sounds. No murmur heard.    No friction rub. No gallop.  Pulmonary:     Effort: Pulmonary effort is normal. No respiratory distress.     Breath sounds: Normal breath sounds. No stridor. No wheezing or rales.  Musculoskeletal:     Cervical back: Normal range of motion.     Right lower leg: No edema.     Left lower leg: No edema.  Lymphadenopathy:     Cervical: No cervical adenopathy.  Neurological:     Mental Status: She is alert and oriented to person, place, and time.     Cranial Nerves: No cranial nerve deficit.     Sensory: No sensory deficit.     Motor: Tremor present. No atrophy, abnormal muscle tone or seizure activity.     Deep Tendon Reflexes: Reflexes are normal and symmetric.           Assessment & Plan:  Low hemoglobin - Plan: Iron, Vitamin B12, Fecal Globin By Immunochemistry  Low serum albumin  Elevated troponin  Elevated brain natriuretic peptide (BNP) level  Right renal artery stenosis (HCC)  I am concerned about the drop in her hemoglobin.  I would recommend checking iron, vitamin B12, and her stool for blood to determine the cause of the mild anemia.  I have recommended that she follow-up with her cardiologist to discuss a referral to vascular surgery regarding a bypass around the atherosclerotic plaque in the aorta.  I do not believe any intervention is needed for the right renal artery stenosis.  I believe the transient elevation in troponin and  BNP was due to strain from her recent episode of atrial fibrillation with tachycardia.  This was self-limited.  I recommended drinking 1 to 2 cans of Ensure daily for her low total protein and her low albumin level.

## 2023-10-21 NOTE — Telephone Encounter (Signed)
 Copied from CRM [id number]. Topic: General - Other >> Oct 21, 2023  3:52 PM Star East wrote: Reason for CRM: question about form that came with the tests- please call [phone]

## 2023-10-22 ENCOUNTER — Ambulatory Visit: Payer: Self-pay | Admitting: Family Medicine

## 2023-10-22 LAB — VITAMIN B12: Vitamin B-12: 1149 pg/mL — ABNORMAL HIGH (ref 200–1100)

## 2023-10-22 LAB — IRON: Iron: 84 ug/dL (ref 45–160)

## 2023-10-23 ENCOUNTER — Encounter: Payer: Self-pay | Admitting: Family Medicine

## 2023-10-25 ENCOUNTER — Encounter: Payer: Self-pay | Admitting: Cardiovascular Disease

## 2023-10-25 ENCOUNTER — Encounter: Payer: Self-pay | Admitting: Family Medicine

## 2023-10-25 DIAGNOSIS — I480 Paroxysmal atrial fibrillation: Secondary | ICD-10-CM

## 2023-10-29 ENCOUNTER — Encounter: Payer: Self-pay | Admitting: Family Medicine

## 2023-10-30 LAB — FECAL GLOBIN BY IMMUNOCHEMISTRY
FECAL GLOBIN RESULT:: NOT DETECTED
MICRO NUMBER:: 16460347
SPECIMEN QUALITY:: ADEQUATE

## 2023-11-06 MED ORDER — APIXABAN 5 MG PO TABS
5.0000 mg | ORAL_TABLET | Freq: Two times a day (BID) | ORAL | 0 refills | Status: DC
Start: 2023-11-06 — End: 2024-02-03

## 2023-11-06 NOTE — Telephone Encounter (Signed)
 Prescription refill request for Eliquis  received. Indication: Afib  Last office visit: 07/31/23 Katheryne Pane)  Scr: 1.06 (10/11/23)  Age: 78 Weight: 53.5kg  Appropriate dose. Refill sent. - Re-evaluate appropriate dose at next refill since pt will turn 78 years old in July.

## 2023-11-06 NOTE — Addendum Note (Signed)
 Addended by: Natividad Balding B on: 11/06/2023 08:12 AM   Modules accepted: Orders

## 2023-11-18 ENCOUNTER — Encounter: Payer: Self-pay | Admitting: Family Medicine

## 2023-11-22 ENCOUNTER — Encounter: Payer: Self-pay | Admitting: Family Medicine

## 2023-11-27 ENCOUNTER — Encounter: Payer: Self-pay | Admitting: Family Medicine

## 2023-11-27 ENCOUNTER — Ambulatory Visit: Admitting: Family Medicine

## 2023-11-27 VITALS — BP 132/60 | HR 66 | Temp 97.6°F | Ht 59.0 in | Wt 118.6 lb

## 2023-11-27 DIAGNOSIS — R351 Nocturia: Secondary | ICD-10-CM | POA: Diagnosis not present

## 2023-11-27 DIAGNOSIS — N3000 Acute cystitis without hematuria: Secondary | ICD-10-CM

## 2023-11-27 LAB — URINALYSIS, ROUTINE W REFLEX MICROSCOPIC
Glucose, UA: NEGATIVE
Hgb urine dipstick: NEGATIVE
Nitrite: NEGATIVE
RBC / HPF: NONE SEEN /HPF (ref 0–2)
Specific Gravity, Urine: 1.02 (ref 1.001–1.035)
pH: 5.5 (ref 5.0–8.0)

## 2023-11-27 LAB — MICROSCOPIC MESSAGE

## 2023-11-27 MED ORDER — CIPROFLOXACIN HCL 500 MG PO TABS: 500.0000 mg | ORAL_TABLET | Freq: Two times a day (BID) | ORAL | 0 refills | Status: AC

## 2023-11-27 NOTE — Progress Notes (Signed)
 Subjective:  HPI: Brittany Kirk is a 78 y.o. female presenting on 11/27/2023 for Medical Management of Chronic Issues (Bladder infection /Lower abdominal pain, she is drinking several oz of fluid during the day but can go hours w/o urinating. She also is having trouble making urine when she feels she needs to go. )   HPI Patient is in today for concerns of UTI, hx of CKD3, complains of decreased urine output during the day with 4-5 hours between urinating and increased voiding at night for a few weeks. Also has suprapubic pain at night when lying on her side.  Urine is yellow, clear, bubbly, no odor. [name] has nocturia 4-5 times nightly. Denies dysuria, hematuria, flank pain, fever, chills, body aches. No medication changes. Recent labs stable with PCP last month.   Review of Systems  All other systems reviewed and are negative.   Relevant past medical history reviewed and updated as indicated.   Past Medical History:  Diagnosis Date   Acid reflux    Allergic rhinitis    Allergy    Anxiety    Arthritis    Asymptomatic bilateral carotid artery stenosis    40-59% (02/2018)   Atrial fibrillation (HCC) 07/2019   Breast cancer (HCC) 2015   Right Breast Cancer   CAD (coronary artery disease)    CKD (chronic kidney disease), stage III (HCC)    Depression    Emphysema    no longer uses inhalers   Gallstones    nausea, pain upper right abdomen   GERD (gastroesophageal reflux disease)    H/O hiatal hernia    Hiatal hernia    History of skin cancer    Hyperlipidemia    Hypertension    Multinodular goiter (nontoxic)    Osteoporosis    Scoliosis    Skin cancer    Tobacco user    Wears dentures    top     Past Surgical History:  Procedure Laterality Date   ABDOMINAL AORTOGRAM W/LOWER EXTREMITY N/A 02/05/2022   Procedure: ABDOMINAL AORTOGRAM W/LOWER EXTREMITY;  Surgeon: Avanell Leigh, MD;  Location: [facility];  Service: Cardiovascular;  Laterality: N/A;    ATRIAL FIBRILLATION ABLATION N/A 04/14/2021   Procedure: ATRIAL FIBRILLATION ABLATION;  Surgeon: Lei Pump, MD;  Location: [facility];  Service: Cardiovascular;  Laterality: N/A;   BREAST LUMPECTOMY Right 2015   CHOLECYSTECTOMY  07/08/2012   Procedure: LAPAROSCOPIC CHOLECYSTECTOMY WITH INTRAOPERATIVE CHOLANGIOGRAM;  Surgeon: Fran Imus, MD,FACS;  Location: [facility];  Service: General;  Laterality: N/A;  Laparoscopic Cholecystectomy with Intraoperative Cholangiogram   COLONOSCOPY     ESOPHAGOSCOPY N/A 05/29/2017   Procedure: ESOPHAGOSCOPY;  Surgeon: Ozell Blunt, MD;  Location: [facility] ENDOSCOPY;  Service: Endoscopy;  Laterality: N/A;  botox  injection   EYE SURGERY     HAND SURGERY Left    wrist   LEFT HEART CATH AND CORONARY ANGIOGRAPHY N/A 10/11/2017   Procedure: LEFT HEART CATH AND CORONARY ANGIOGRAPHY;  Surgeon: Swaziland, Peter M, MD;  Location: [facility] INVASIVE CV LAB;  Service: Cardiovascular;  Laterality: N/A;   TONSILECTOMY, ADENOIDECTOMY, BILATERAL MYRINGOTOMY AND TUBES     TUBAL LIGATION      Allergies and medications reviewed and updated.   Current Outpatient Medications:    acetaminophen  (TYLENOL ) 500 MG tablet, Take 1,000 mg by mouth every 6 (six) hours as needed for moderate pain or headache., Disp: , Rfl:    apixaban  (ELIQUIS ) 5 MG TABS tablet, Take 1 tablet (5 mg  total) by mouth 2 (two) times daily., Disp: 180 tablet, Rfl: 0   ascorbic acid  (VITAMIN C) 1000 MG tablet, Take 1,000 mg by mouth daily., Disp: , Rfl:    atorvastatin  (LIPITOR) 40 MG tablet, Take 1 tablet (40 mg total) by mouth daily., Disp: 90 tablet, Rfl: 1   cholecalciferol  (VITAMIN D3) 25 MCG (1000 UNIT) tablet, Take 1,000 Units by mouth daily., Disp: , Rfl:    ciprofloxacin  (CIPRO ) 500 MG tablet, Take 1 tablet (500 mg total) by mouth 2 (two) times daily for 7 days., Disp: 14 tablet, Rfl: 0   diazepam  (VALIUM ) 5 MG tablet, Take 5 mg by mouth as needed for sedation., Disp: , Rfl:    ferrous sulfate 325  (65 FE) MG tablet, Take 325 mg by mouth daily after lunch., Disp: , Rfl:    Folic Acid-B12-B6-Arginine 0.067-0.017-3.3 1000 MG TABS, Take 1,000 mg by mouth daily., Disp: , Rfl:    valsartan  (DIOVAN ) 40 MG tablet, Take 1 tablet (40 mg total) by mouth daily. Hold for Blood Pressure <120, Disp: 90 tablet, Rfl: 3   omeprazole (PRILOSEC) 20 MG capsule, Take 20 mg by mouth daily. (Patient not taking: Reported on 11/27/2023), Disp: , Rfl:   Allergies  Allergen Reactions   Penicillins Anaphylaxis, Itching, Swelling and Rash    Has patient had a PCN reaction causing immediate rash, facial/tongue/throat swelling, SOB or lightheadedness with hypotension: Yes Has patient had a PCN reaction causing severe rash involving mucus membranes or skin necrosis: No Has patient had a PCN reaction that required hospitalization: Yes Has patient had a PCN reaction occurring within the last 10 years: No If all of the above answers are NO, then may proceed with Cephalosporin use.    Fosamax  [Alendronate ]     myalgias   Levofloxacin  Nausea Only and Other (See Comments)    Causes headache, fatigue and dizziness   Mexiletine Itching, Nausea Only and Other (See Comments)   Trimethoprim    Cefaclor Itching, Swelling and Rash   Latex Itching and Rash   Metronidazole Itching, Swelling and Rash   Naproxen Itching, Swelling and Rash   Nsaids Itching, Swelling and Rash    Ibuprofen  IS tolerated   Septra [Bactrim] Itching, Swelling and Rash   Sulfonamide Derivatives Itching, Swelling and Rash   Tape Itching and Rash    ONLY USE PAPER TAPE   Wound Dressing Adhesive Itching and Rash    Objective:   BP 132/60   Pulse 66   Temp 97.6 F (36.4 C)   Ht 4' 11 (1.499 m)   Wt 118 lb 9.6 oz (53.8 kg)   SpO2 99%   BMI 23.95 kg/m      11/27/2023   11:23 AM 10/21/2023    2:36 PM 10/11/2023    8:11 AM  Vitals with BMI  Height 4' 11 4' 11   Weight 118 lbs 10 oz 118 lbs   BMI 23.94 23.82   Systolic 132 118 865   Diastolic 60 62 56  Pulse 66 62 65     Physical Exam Vitals and nursing note reviewed.  Constitutional:      Appearance: Normal appearance. She is normal weight.  HENT:     Head: Normocephalic and atraumatic.   Cardiovascular:     Rate and Rhythm: Normal rate and regular rhythm.     Pulses: Normal pulses.     Heart sounds: Normal heart sounds.  Pulmonary:     Effort: Pulmonary effort is normal.  Breath sounds: Normal breath sounds.  Abdominal:     Tenderness: There is abdominal tenderness in the suprapubic area. There is right CVA tenderness. There is no left CVA tenderness.   Skin:    General: Skin is warm and dry.   Neurological:     General: No focal deficit present.     Mental Status: She is alert and oriented to person, place, and time. Mental status is at baseline.   Psychiatric:        Mood and Affect: Mood normal.        Behavior: Behavior normal.        Thought Content: Thought content normal.        Judgment: Judgment normal.     Assessment & Plan:  Acute cystitis without hematuria Assessment & Plan: Urine dipstick shows positive for WBC's, positive for protein, positive for urobilinogen, and positive for ketones.  Micro exam: 0-5 WBC's per HPF, few+ bacteria, and 1-3 casts seen. Will treat for UTI with Cipro  500mg  BID x7d (reviewed allergies with pt, history of nausea, advised to notify MD if unable to tolerate). If symptoms persist or worsen return to office.     Nocturia -     Urine Culture -     Urinalysis, Routine w reflex microscopic  Other orders -     Ciprofloxacin  HCl; Take 1 tablet (500 mg total) by mouth 2 (two) times daily for 7 days.  Dispense: 14 tablet; Refill: 0     Follow up plan: Return if symptoms worsen or fail to improve.  Jenelle Mis, FNP

## 2023-11-27 NOTE — Assessment & Plan Note (Signed)
 Urine dipstick shows positive for WBC's, positive for protein, positive for urobilinogen, and positive for ketones.  Micro exam: 0-5 WBC's per HPF, few+ bacteria, and 1-3 casts seen. Will treat for UTI with Cipro  500mg  BID x7d (reviewed allergies with pt, history of nausea, advised to notify MD if unable to tolerate). If symptoms persist or worsen return to office.

## 2023-11-28 LAB — URINE CULTURE
MICRO NUMBER:: 16595878
Result:: NO GROWTH
SPECIMEN QUALITY:: ADEQUATE

## 2023-11-29 ENCOUNTER — Encounter: Payer: Self-pay | Admitting: Family Medicine

## 2023-12-03 DIAGNOSIS — N760 Acute vaginitis: Secondary | ICD-10-CM | POA: Diagnosis not present

## 2023-12-12 ENCOUNTER — Ambulatory Visit: Payer: Self-pay

## 2023-12-12 NOTE — Telephone Encounter (Signed)
 Copied from CRM [id number]. Topic: Clinical - Red Word Triage >> Dec 12, 2023  4:12 PM Selinda RAMAN wrote: Red Word that prompted transfer to Nurse Triage: The patient called in stating she saw her provider on June 18th and she had blood work done showing she is anemic. She says she her low red blood cell count is larger. She has felt more lethargic then ever before and she has been having pain in her right rib cage. I will transfer her to E2C2 NT Reason for Disposition  [1] MODERATE weakness (i.e., interferes with work, school, normal activities) AND [2] persists > 3 days  Answer Assessment - Initial Assessment Questions 1. DESCRIPTION: Describe how you are feeling.     ---- Recently dx with Anemia    2. SEVERITY: How bad is it?  Can you stand and walk?   - MILD (0-3): Feels weak or tired, but does not interfere with work, school or normal activities.   - MODERATE (4-7): Able to stand and walk; weakness interferes with work, school, or normal activities.   - SEVERE (8-10): Unable to stand or walk; unable to do usual activities.      ------------------------ 7/10    3. ONSET: When did these symptoms begin? (e.g., hours, days, weeks, months)     --------------------------- A few weeks   4. CAUSE: What do you think is causing the weakness or fatigue? (e.g., not drinking enough fluids, medical problem, trouble sleeping)     ------------- Anemia   5. NEW MEDICINES:  Have you started on any new medicines recently? (e.g., opioid pain medicines, benzodiazepines, muscle relaxants, antidepressants, antihistamines, neuroleptics, beta blockers)     --- Vitamin B12 + Iron   6. OTHER SYMPTOMS: Do you have any other symptoms? (e.g., chest pain, fever, cough, SOB, vomiting, diarrhea, bleeding, other areas of pain)     ---- Weak, nausea, feeling winded after exertion, and intermittent dizziness. Dizziness dissipates after standing still for some time.     Additional:   Patient reports depsite  taking the iron and B12, she is still tired/fatigued.  Patient was seen by PCP 06/18  Relays that she is feeling more fatigued than when she initially seen PCP.    Appointment scheduled for patient appropriately- Local UC Patient educated on pertinent s/s that would warrant emergent help/call 911/ ED/UC.  Patient verbalized understanding and agrees with plan . No additional questions/concerns noted during the time of the call.  Protocols used: Weakness (Generalized) and Fatigue-A-AH

## 2023-12-13 ENCOUNTER — Encounter (HOSPITAL_COMMUNITY): Payer: Self-pay

## 2023-12-13 ENCOUNTER — Ambulatory Visit (HOSPITAL_COMMUNITY)
Admission: RE | Admit: 2023-12-13 | Discharge: 2023-12-13 | Disposition: A | Discharge status: Home / Self Care | Payer: Self-pay | Source: Ambulatory Visit | Attending: Physician Assistant | Admitting: Physician Assistant

## 2023-12-13 ENCOUNTER — Ambulatory Visit (INDEPENDENT_AMBULATORY_CARE_PROVIDER_SITE_OTHER)

## 2023-12-13 VITALS — BP 188/66 | HR 56 | Temp 97.9°F | Resp 16

## 2023-12-13 DIAGNOSIS — R5381 Other malaise: Secondary | ICD-10-CM | POA: Insufficient documentation

## 2023-12-13 DIAGNOSIS — R5383 Other fatigue: Secondary | ICD-10-CM | POA: Insufficient documentation

## 2023-12-13 DIAGNOSIS — R531 Weakness: Secondary | ICD-10-CM | POA: Insufficient documentation

## 2023-12-13 DIAGNOSIS — R42 Dizziness and giddiness: Secondary | ICD-10-CM | POA: Diagnosis not present

## 2023-12-13 DIAGNOSIS — I700 Atherosclerosis of aorta: Secondary | ICD-10-CM | POA: Diagnosis not present

## 2023-12-13 DIAGNOSIS — R0602 Shortness of breath: Secondary | ICD-10-CM | POA: Insufficient documentation

## 2023-12-13 LAB — COMPREHENSIVE METABOLIC PANEL WITH GFR
ALT: 14 U/L (ref 0–44)
AST: 26 U/L (ref 15–41)
Albumin: 3.6 g/dL (ref 3.5–5.0)
Alkaline Phosphatase: 39 U/L (ref 38–126)
Anion gap: 12 (ref 5–15)
BUN: 18 mg/dL (ref 8–23)
CO2: 23 mmol/L (ref 22–32)
Calcium: 9 mg/dL (ref 8.9–10.3)
Chloride: 100 mmol/L (ref 98–111)
Creatinine, Ser: 1.13 mg/dL — ABNORMAL HIGH (ref 0.44–1.00)
GFR, Estimated: 49 mL/min — ABNORMAL LOW (ref >60)
Glucose, Bld: 89 mg/dL (ref 70–99)
Potassium: 4.2 mmol/L (ref 3.5–5.1)
Sodium: 135 mmol/L (ref 135–145)
Total Bilirubin: 0.5 mg/dL (ref 0.0–1.2)
Total Protein: 6.4 g/dL — ABNORMAL LOW (ref 6.5–8.1)

## 2023-12-13 LAB — CBC WITH DIFFERENTIAL/PLATELET
Abs Immature Granulocytes: 0.05 K/uL (ref 0.00–0.07)
Basophils Absolute: 0 K/uL (ref 0.0–0.1)
Basophils Relative: 0 %
Eosinophils Absolute: 0.1 K/uL (ref 0.0–0.5)
Eosinophils Relative: 1 %
HCT: 36.9 % (ref 36.0–46.0)
Hemoglobin: 12.3 g/dL (ref 12.0–15.0)
Immature Granulocytes: 1 %
Lymphocytes Relative: 31 %
Lymphs Abs: 2.4 K/uL (ref 0.7–4.0)
MCH: 33.6 pg (ref 26.0–34.0)
MCHC: 33.3 g/dL (ref 30.0–36.0)
MCV: 100.8 fL — ABNORMAL HIGH (ref 80.0–100.0)
Monocytes Absolute: 0.6 K/uL (ref 0.1–1.0)
Monocytes Relative: 7 %
Neutro Abs: 4.6 K/uL (ref 1.7–7.7)
Neutrophils Relative %: 60 %
Platelets: 220 K/uL (ref 150–400)
RBC: 3.66 MIL/uL — ABNORMAL LOW (ref 3.87–5.11)
RDW: 13.4 % (ref 11.5–15.5)
WBC: 7.6 K/uL (ref 4.0–10.5)
nRBC: 0 % (ref 0.0–0.2)

## 2023-12-13 LAB — POCT URINALYSIS DIP (MANUAL ENTRY)
Bilirubin, UA: NEGATIVE
Blood, UA: NEGATIVE
Glucose, UA: NEGATIVE mg/dL
Ketones, POC UA: NEGATIVE mg/dL
Leukocytes, UA: NEGATIVE
Nitrite, UA: NEGATIVE
Protein Ur, POC: NEGATIVE mg/dL
Spec Grav, UA: <=1.005 — AB (ref 1.010–1.025)
Urobilinogen, UA: 0.2 U/dL
pH, UA: 5 (ref 5.0–8.0)

## 2023-12-13 LAB — CK: Total CK: 44 U/L (ref 38–234)

## 2023-12-13 LAB — T4, FREE: Free T4: 1.2 ng/dL — ABNORMAL HIGH (ref 0.61–1.12)

## 2023-12-13 LAB — TSH: TSH: 0.601 u[IU]/mL (ref 0.350–4.500)

## 2023-12-13 NOTE — ED Notes (Signed)
 Patient is being discharged from the Urgent Care and sent to the Emergency Department via private vehicle . Per Rocky Gilford, PA, patient is in need of higher level of care due to fatigue with bradycardia. Patient is aware and verbalizes understanding of plan of care.  Vitals:   12/13/23 1618  BP: (!) 188/66  Pulse: (!) 56  Resp: 16  Temp: 97.9 F (36.6 C)  SpO2: 98%

## 2023-12-13 NOTE — ED Provider Notes (Signed)
 [facility]    CSN: [id number] Arrival date & time: 12/13/23  1601      History   Chief Complaint Chief Complaint  Patient presents with   Fatigue    Entered by patient    HPI Brittany Kirk is a 78 y.o. female.   Patient presents today companied by her husband who provides majority of history.  She reports for the past 2 months she has had intermittent lightheadedness, fatigue, malaise, widespread body aches.  She denies any chest pain but has had some shortness of breath.  She was recently hospitalized for A-fib in May 2025 at which point she was noted to have hypoalbuminemia with chronic anemia.  She has been taking B12 supplements and iron supplements without improvement.  She was seen by her PCP and had negative iFOB.  She is chronically anticoagulated with Eliquis  but denies any known blood loss including bruising, hematuria, melena, hematochezia.  She reports that recently she has become even more fatigued and even just normal activities caused her to be exhausted.  She does have a remote history of malignancy but has not been taking chemotherapy.  Denies any recent medication changes.  She denies any nausea, vomiting, diarrhea.  Denies any recent illness including cough or congestion.      Past Medical History:  Diagnosis Date   Acid reflux    Allergic rhinitis    Allergy    Anxiety    Arthritis    Asymptomatic bilateral carotid artery stenosis    40-59% (02/2018)   Atrial fibrillation (HCC) 07/2019   Breast cancer (HCC) 2015   Right Breast Cancer   CAD (coronary artery disease)    CKD (chronic kidney disease), stage III (HCC)    Depression    Emphysema    no longer uses inhalers   Gallstones    nausea, pain upper right abdomen   GERD (gastroesophageal reflux disease)    H/O hiatal hernia    Hiatal hernia    History of skin cancer    Hyperlipidemia    Hypertension    Multinodular goiter (nontoxic)    Osteoporosis    Scoliosis    Skin cancer     Tobacco user    Wears dentures    top    Patient Active Problem List   Diagnosis Date Noted   Acute cystitis without hematuria 11/27/2023   Lumbar spondylosis 06/25/2023   Neck pain 06/25/2023   Arthritis of spine 07/31/2022   Peripheral arterial disease (HCC) 12/20/2021   PVC's (premature ventricular contractions) 08/31/2021   COVID-19 virus infection 06/17/2021   Paroxysmal atrial fibrillation (HCC) 05/15/2021   Secondary hypercoagulable state (HCC) 05/15/2021   Chest pain 01/13/2021   Atrial fibrillation, rapid (HCC) 07/31/2020   Upper respiratory tract infection 06/09/2020   Atrial fibrillation with RVR (HCC) 07/15/2019   Multinodular goiter (nontoxic)    Asymptomatic bilateral carotid artery stenosis    History of depression 02/14/2018   History of breast cancer 02/14/2018   Accelerated hypertension 02/14/2018   Unstable angina (HCC) 10/11/2017   Caloric malnutrition (HCC) 08/16/2017   Weight loss, unintentional 03/12/2017   Hoarseness 02/15/2017   Laryngopharyngeal reflux (LPR) 02/15/2017   Pharyngoesophageal dysphagia 02/15/2017   GERD (gastroesophageal reflux disease) 08/10/2016   Cough 08/10/2016   Acute bronchitis 07/12/2016   Bilateral carotid artery disease (HCC) 02/14/2016   Chest pain with moderate risk for cardiac etiology 01/13/2016   Osteoporosis 02/22/2015   Stage 2 chronic kidney disease due to benign hypertension 08/16/2014  Malignant neoplasm of upper-outer quadrant of right breast in female, estrogen receptor positive (HCC) 03/22/2014   Dizziness and giddiness 11/27/2012   Essential tremor 11/27/2012   Numbness 11/27/2012   CLOSED FRACTURE OF ONE RIB 07/12/2009   Hyperlipidemia 09/28/2008   CAD in native artery 09/28/2008   Rhinitis 09/09/2008   COPD (chronic obstructive pulmonary disease) (HCC) 09/07/2008   Essential hypertension 09/06/2008   SKIN CANCER, HX OF 09/06/2008    Past Surgical History:  Procedure Laterality Date   ABDOMINAL  AORTOGRAM W/LOWER EXTREMITY N/A 02/05/2022   Procedure: ABDOMINAL AORTOGRAM W/LOWER EXTREMITY;  Surgeon: Court Dorn PARAS, MD;  Location: [facility];  Service: Cardiovascular;  Laterality: N/A;   ATRIAL FIBRILLATION ABLATION N/A 04/14/2021   Procedure: ATRIAL FIBRILLATION ABLATION;  Surgeon: Inocencio Soyla Lunger, MD;  Location: [facility];  Service: Cardiovascular;  Laterality: N/A;   BREAST LUMPECTOMY Right 2015   CHOLECYSTECTOMY  07/08/2012   Procedure: LAPAROSCOPIC CHOLECYSTECTOMY WITH INTRAOPERATIVE CHOLANGIOGRAM;  Surgeon: Camellia CHRISTELLA Blush, MD,FACS;  Location: [facility];  Service: General;  Laterality: N/A;  Laparoscopic Cholecystectomy with Intraoperative Cholangiogram   COLONOSCOPY     ESOPHAGOSCOPY N/A 05/29/2017   Procedure: ESOPHAGOSCOPY;  Surgeon: Rosalie Kitchens, MD;  Location: [facility];  Service: Endoscopy;  Laterality: N/A;  botox  injection   EYE SURGERY     HAND SURGERY Left    wrist   LEFT HEART CATH AND CORONARY ANGIOGRAPHY N/A 10/11/2017   Procedure: LEFT HEART CATH AND CORONARY ANGIOGRAPHY;  Surgeon: [place], Peter M, MD;  [facility];  Service: Cardiovascular;  Laterality: N/A;   TONSILECTOMY, ADENOIDECTOMY, BILATERAL MYRINGOTOMY AND TUBES     TUBAL LIGATION      OB History   No obstetric history on file.      Home Medications    Prior to Admission medications   Medication Sig Start Date End Date Taking? Authorizing Provider  valsartan  (DIOVAN ) 40 MG tablet Take 1 tablet (40 mg total) by mouth daily. Hold for Blood Pressure <120 01/05/22  Yes Leverne Charlies Helling, PA-C  acetaminophen  (TYLENOL ) 500 MG tablet Take 1,000 mg by mouth every 6 (six) hours as needed for moderate pain or headache.    [provider]  apixaban  (ELIQUIS ) 5 MG TABS tablet Take 1 tablet (5 mg total) by mouth 2 (two) times daily. 11/06/23   Court Dorn PARAS, MD  ascorbic acid  (VITAMIN C) 1000 MG tablet Take 1,000 mg by mouth daily.    [provider]   atorvastatin  (LIPITOR) 40 MG tablet Take 1 tablet (40 mg total) by mouth daily. 09/05/23   Duanne Butler DASEN, MD  cholecalciferol  (VITAMIN D3) 25 MCG (1000 UNIT) tablet Take 1,000 Units by mouth daily.    [provider]  diazepam  (VALIUM ) 5 MG tablet Take 5 mg by mouth as needed for sedation. 12/13/20   [provider]  ferrous sulfate 325 (65 FE) MG tablet Take 325 mg by mouth daily after lunch. 10/23/23   [provider]  Folic Acid-B12-B6-Arginine 9.932-9.982-6.6 1000 MG TABS Take 1,000 mg by mouth daily.    [provider]  omeprazole (PRILOSEC) 20 MG capsule Take 20 mg by mouth daily. Patient not taking: Reported on 11/27/2023    [provider]    Family History Family History  Problem Relation Age of Onset   Arthritis Mother    Pancreatic cancer Mother    Cancer Mother    Heart disease Other        5  brothers and 2 sister   Diverticulitis Sister    Heart disease Sister    Aplastic anemia Other    Heart attack Paternal Grandfather        Heart Attacl   Cancer Brother    Heart disease Brother    Heart disease Brother    Heart disease Sister    Heart disease Brother    Heart disease Brother    Cancer Brother    Heart disease Brother     Social History Social History   Tobacco Use   Smoking status: Former    Current packs/day: 0.00    Average packs/day: 0.5 packs/day for 46.0 years (23.0 ttl pk-yrs)    Types: Cigarettes    Start date: 02/12/1965    Quit date: 02/13/2011    Years since quitting: 12.8   Smokeless tobacco: Never  Vaping Use   Vaping status: Never Used  Substance Use Topics   Alcohol use: No   Drug use: No     Allergies   Penicillins, Fosamax  [alendronate ], Levofloxacin , Mexiletine, Trimethoprim, Cefaclor, Latex, Metronidazole, Naproxen, Nsaids, Septra [bactrim], Sulfonamide derivatives, Tape, and Wound dressing adhesive   Review of Systems Review of Systems  Constitutional:  Positive for activity change.  Negative for appetite change, fatigue and fever.  Respiratory:  Positive for shortness of breath. Negative for cough.   Cardiovascular:  Negative for chest pain, palpitations and leg swelling.  Gastrointestinal:  Negative for abdominal pain, blood in stool, diarrhea, nausea and vomiting.  Genitourinary:  Negative for dysuria, frequency and urgency.  Musculoskeletal:  Positive for arthralgias. Negative for myalgias.  Neurological:  Positive for weakness (generalized) and light-headedness. Negative for dizziness, numbness and headaches.     Physical Exam Triage Vital Signs ED Triage Vitals  Encounter Vitals Group     BP 12/13/23 1618 (!) 188/66     Girls Systolic BP Percentile --      Girls Diastolic BP Percentile --      Boys Systolic BP Percentile --      Boys Diastolic BP Percentile --      Pulse Rate 12/13/23 1618 (!) 56     Resp 12/13/23 1618 16     Temp 12/13/23 1618 97.9 F (36.6 C)     Temp Source 12/13/23 1618 Oral     SpO2 12/13/23 1618 98 %     Weight --      Height --      Head Circumference --      Peak Flow --      Pain Score 12/13/23 1625 4     Pain Loc --      Pain Education --      Exclude from Growth Chart --    Orthostatic VS for the past 24 hrs:  BP- Lying Pulse- Lying BP- Sitting Pulse- Sitting BP- Standing at 0 minutes Pulse- Standing at 0 minutes  12/13/23 1734 (!) 207/75 57 (!) 206/74 58 192/76 58    Updated Vital Signs BP (!) 188/66 (BP Location: Left Arm)   Pulse (!) 56   Temp 97.9 F (36.6 C) (Oral)   Resp 16   SpO2 98%   Visual Acuity Right Eye Distance:   Left Eye Distance:   Bilateral Distance:    Right Eye Near:   Left Eye Near:    Bilateral Near:     Physical Exam Vitals reviewed.  Constitutional:      General: She is awake. She is not in acute distress.    Appearance:  Normal appearance. She is well-developed. She is not ill-appearing.     Comments: Very pleasant female appears stated age in no acute distress sitting  comfortably in exam room  HENT:     Head: Normocephalic and atraumatic.  Eyes:     Extraocular Movements: Extraocular movements intact.  Cardiovascular:     Rate and Rhythm: Regular rhythm. Bradycardia present.     Heart sounds: Normal heart sounds, S1 normal and S2 normal. No murmur heard. Pulmonary:     Effort: Pulmonary effort is normal.     Breath sounds: Normal breath sounds. No wheezing, rhonchi or rales.     Comments: Clear to auscultation bilaterally Chest:     Chest wall: Tenderness present. No deformity or swelling.     Comments: Mild tenderness palpation over right lateral rib cage.  No deformity noted. Abdominal:     General: Bowel sounds are normal.     Palpations: Abdomen is soft.     Tenderness: There is no abdominal tenderness. There is no right CVA tenderness, left CVA tenderness, guarding or rebound.     Comments: Benign abdominal exam  Psychiatric:        Behavior: Behavior is cooperative.      UC Treatments / Results  Labs (all labs ordered are listed, but only abnormal results are displayed) Labs Reviewed  POCT URINALYSIS DIP (MANUAL ENTRY) - Abnormal; Notable for the following components:      Result Value   Spec Grav, UA <=1.005 (*)    All other components within normal limits  CBC WITH DIFFERENTIAL/PLATELET  COMPREHENSIVE METABOLIC PANEL WITH GFR  TSH  CK  T4, FREE    EKG   Radiology DG Chest 2 View Result Date: 12/13/2023 CLINICAL DATA:  Shortness of breath EXAM: CHEST - 2 VIEW COMPARISON:  10/11/2023 FINDINGS: Heart and mediastinal contours are within normal limits. No focal opacities or effusions. No acute bony abnormality. Aortic atherosclerosis. IMPRESSION: No active cardiopulmonary disease. Electronically Signed   By: Franky Crease M.D.   On: 12/13/2023 17:38    Procedures Procedures (including critical care time)  Medications Ordered in UC Medications - No data to display  Initial Impression / Assessment and Plan / UC Course  I have  reviewed the triage vital signs and the nursing notes.  Pertinent labs & imaging results that were available during my care of the patient were reviewed by me and considered in my medical decision making (see chart for details).     Patient is hypertensive but otherwise well-appearing, afebrile, nontoxic, nontachycardic.  She reports symptoms were present for several months.  EKG was obtained that showed sinus bradycardia with ventricular rate of 58 bpm without ischemic changes; compared to previous tracing sinus bradycardia has replaced atrial fibrillation.  We discussed that since she is feeling weak with bradycardia it is reasonable go to the emergency room, however, she reports that her heart rate is always low and review of previous vital signs from other visits indicates that this is generally in the 60s so it is lower than baseline but not by significant amount.  We also discussed that given her hypertension it is reasonable to go but she reports that this is because of the pain with our automated blood pressure cuff and that she took her blood pressure earlier today and it was normal.  She will monitor her blood pressure at home if remains elevated or she develops any additional symptoms she will go to the ER.  She reports that she does  not feel any worse today than she has felt in the past few weeks but just is concerned about all of her symptoms.  She did request blood work and so basic blood work including CBC, CMP, thyroid  studies, CK obtained and pending.  She was encouraged to continue the iron and B12 supplement as previously recommended by her primary care.  Recommend that she follow-up with her PCP first thing next week but discussed that if anything changes or worsens over the weekend she would need to go to the emergency room immediately.  Strict turn precautions given.  All questions were answered to patient and husband satisfaction.  Final Clinical Impressions(s) / UC Diagnoses   Final  diagnoses:  SOB (shortness of breath)  Episodic lightheadedness  Generalized weakness  Malaise and fatigue     Discharge Instructions      As we discussed, with your symptoms go to the emergency room for further evaluation and management since you are feeling bad and your heart rate is slightly low.  I will contact if any blood work is abnormal.  Please monitor your heart rate and blood pressure at home and if this remains elevated you need to be seen again.  Follow-up with your primary care for first thing next week.  If you have any changing or worsening symptoms you need to go to the ER as we discussed.     ED Prescriptions   None    PDMP not reviewed this encounter.   Sherrell Rocky POUR, PA-C 12/13/23 1832

## 2023-12-13 NOTE — ED Triage Notes (Signed)
 Patient here today with c/o fatigue X 2 months. Patient was in the hospital for Afib a few months ago and was told that she had low red blood count. Patient takes Iron every other day.

## 2023-12-13 NOTE — Discharge Instructions (Addendum)
 As we discussed, with your symptoms go to the emergency room for further evaluation and management since you are feeling bad and your heart rate is slightly low.  I will contact if any blood work is abnormal.  Please monitor your heart rate and blood pressure at home and if this remains elevated you need to be seen again.  Follow-up with your primary care for first thing next week.  If you have any changing or worsening symptoms you need to go to the ER as we discussed.

## 2023-12-16 ENCOUNTER — Ambulatory Visit (HOSPITAL_COMMUNITY): Payer: Self-pay

## 2023-12-16 ENCOUNTER — Telehealth: Payer: Self-pay

## 2023-12-16 ENCOUNTER — Ambulatory Visit: Payer: Self-pay

## 2023-12-16 NOTE — Telephone Encounter (Addendum)
 FYI Only or Action Required?: FYI only for provider.  Patient was last seen in primary care on 11/27/2023 by Kayla Jeoffrey RAMAN, FNP.  Called Nurse Triage reporting Pain.  Symptoms began 10/2023.  Interventions attempted: Nothing.  Symptoms are: gradually worsening.  Triage Disposition: No disposition on file.  Patient/caregiver understands and will follow disposition?:    FYI   Copied from CRM [id number]. Topic: Clinical - Red Word Triage >> Dec 16, 2023  2:06 PM Charlet HERO wrote: Red Word that prompted transfer to Nurse Triage: Patient is calling about her ribs and back are hurting she is stating that she is taking iron pills and she still feels really weak, tired and exhausted. She is also complaining of head aches and nauseated. Answer Assessment - Initial Assessment Questions 1. ONSET: When did the muscle aches or body pains start?      10/2023 2. LOCATION: What part of your body is hurting? (e.g., entire body, arms, legs)      In back right side below rib cage area, moderate & comes & goes and rib area are extremely sore all the time/painful,  bilateral leg pain 3. SEVERITY: How bad is the pain? (Scale 1-10; or mild, moderate, severe)   - MILD (1-3): doesn't interfere with normal activities    - MODERATE (4-7): interferes with normal activities or awakens from sleep    - SEVERE (8-10):  excruciating pain, unable to do any normal activities      moderate 4. CAUSE: What do you think is causing the pains?     unknown 5. FEVER: Have you been having fever?     no 6. OTHER SYMPTOMS: Do you have any other symptoms? (e.g., chest pain, weakness, rash, cold or flu symptoms, weight loss)     Weakness, SOB at times, SOB w/excertion 7. PREGNANCY: Is there any chance you are pregnant? When was your last menstrual period?     N.a 8. TRAVEL: Have you traveled out of the country in the last month? (e.g., travel history, exposures)     N/a  Pt c/o pain Under rib cage on right  side - is sore at times  Protocols used: Muscle Aches and Body Pain-A-AH

## 2023-12-16 NOTE — Telephone Encounter (Signed)
 First attempt to contact pt. For update. No answer. Lvm for call back.

## 2023-12-16 NOTE — Telephone Encounter (Signed)
 Copied from CRM [id number]. Topic: Clinical - Lab/Test Results >> Dec 16, 2023  1:59 PM Charlet HERO wrote: Reason for CRM: Patient is returning call about labs she would like to have a call back

## 2023-12-18 ENCOUNTER — Encounter: Payer: Self-pay | Admitting: Family Medicine

## 2023-12-18 ENCOUNTER — Ambulatory Visit: Admitting: Family Medicine

## 2023-12-18 VITALS — BP 130/82 | HR 60 | Temp 98.5°F | Ht 59.0 in | Wt 118.2 lb

## 2023-12-18 DIAGNOSIS — N1831 Chronic kidney disease, stage 3a: Secondary | ICD-10-CM

## 2023-12-18 DIAGNOSIS — R718 Other abnormality of red blood cells: Secondary | ICD-10-CM

## 2023-12-18 DIAGNOSIS — R5383 Other fatigue: Secondary | ICD-10-CM

## 2023-12-18 DIAGNOSIS — I480 Paroxysmal atrial fibrillation: Secondary | ICD-10-CM

## 2023-12-18 DIAGNOSIS — M940 Chondrocostal junction syndrome [Tietze]: Secondary | ICD-10-CM | POA: Diagnosis not present

## 2023-12-18 DIAGNOSIS — R0781 Pleurodynia: Secondary | ICD-10-CM

## 2023-12-18 DIAGNOSIS — I10 Essential (primary) hypertension: Secondary | ICD-10-CM | POA: Diagnosis not present

## 2023-12-18 DIAGNOSIS — I4891 Unspecified atrial fibrillation: Secondary | ICD-10-CM

## 2023-12-18 NOTE — Progress Notes (Unsigned)
 Patient Office Visit  Assessment & Plan:  Other fatigue  Essential hypertension  Rib pain on right side  Costochondritis  Abnormal RBC -     Ambulatory referral to Hematology / Oncology  Paroxysmal atrial fibrillation (HCC)  CKD stage 3a, GFR 45-59 ml/min (HCC)     Return if symptoms worsen or fail to improve.  Assessment and Plan    Chronic Anemia and fatigue Chronic anemia with low red blood cells and high MCV. Normal iron levels. Hemoglobin and hematocrit stable. No immediate malignancy concern. Hematology consult ordered due to patient concern - Refer to hematologist in [place]  Chronic Kidney Disease Stage 3 Chronic kidney disease stage 3 with slightly abnormal kidney function, possibly due to renal artery blockage. patient can have this repeated.   Atrial Fibrillation paroxysmal  Atrial fibrillation well-controlled with infrequent episodes. Under cardiologist Dr. Dorn Deiters care. - Schedule follow-up appointment with cardiologist Dr. Dorn Lesches.  Right side rib pain/possible Costochondritis Tenderness and pain in ribcage area, likely costochondritis. X-ray showed no fractures or pneumonia. Discussed Cymbalta  or gabapentin as options for rib pain but patient declines starting new medication.  Previous history of Breast Cancer History of breast cancer treated in 2015 with lymph node removal. Regular mammograms show no recurrence.  Follow-up Requires follow-up with cardiologist and hematologist for anemia evaluation. - Ensure hematology referral in [place]- patient aware that this will be at the cancer center (pt prefers [facility] location)   Test results were reviewed and analyzed as part of the medical decision making of this visit. Reviewed previous labs and notes during the office visit.    Subjective:    Patient ID: Brittany Kirk, female    DOB: Oct 11, 1945  Age: 78 y.o. MRN: [id number]  Chief Complaint  Patient presents with   Fatigue    Pt c/o of  fatigue associated with rib tenderness and aching bones. Pt states that it has been going on awhile    HPI Discussed the use of AI scribe software for clinical note transcription with the patient, who gave verbal consent to proceed.  History of Present Illness        Brittany Kirk is an 78 year old female with chronic anemia and atrial fibrillation who presents with concerns about low red blood cells and fatigue and rib pain.   She experiences chronic fatigue and weakness, ongoing for several months maybe longer, which limits her ability to perform daily activities such as walking to the mailbox. She has a history of chronic anemia and was told to take OTC Iron.  Previous tests have shown low red blood cells. She is currently taking B12 supplements, and Vit B12 noted to be high in May, and continues to take iron supplements. No recent falls, interference with sleep, nausea, vomiting, or changes in sleep patterns. Patient wants to know why she is so fatigued and is concerned about her labs showing low RBC numbers as well as elevated MCV.   She has a history of atrial fibrillation and is under the care of a cardiologist. She has experienced intermittent episodes of AFib, carotid disease and renal artery stenosis (right) with the most recent hospitalization occurring in June for Afib She underwent an ablation procedure a few years ago for Afib and she can recognize when she goes into AFib, often resulting in emergency room visits for heart rate control. Her current medications include Diovan  and Eliquis . Patient will see cardiology in the near future Dr. Lesches  She reports tenderness and  pain in her ribs, present for several months or longer, described as tender to touch and extending from the ribs downwards. She initially attributed the pain to sleeping positions, but it persists despite changing sides and now sleeping on the left side. An x-ray performed July 4th showed no fractures or  pneumonia.  Her past medical history includes breast cancer in 2015, treated with surgery and removal of about 18 lymph nodes, resulting in lymphedema in her right arm.  CKD stage 3A- She was also told recently at Urgent Care that she has chronic kidney disease stage 3. Has had previous labs indicating abnormal kidney function, possibly related to a renal artery blockage. Patient was not aware of this and is worried about what this can mean going forward.   Family history includes arthritis in her mother and pancreatic cancer. She is the youngest of nine siblings, with only one brother still living. Several siblings died of heart attacks. Physical Exam CHEST: Lungs clear to auscultation bilaterally. Results LABS RBC: low (12/13/2023) TSH: normal (12/13/2023) Creatinine: elevated (12/13/2023) MCV: high (12/13/2023) Vitamin B12: high (10/2023) Serum iron: normal (10/2023) Liver function tests: normal (12/13/2023)  RADIOLOGY Chest x-ray: no pneumonia, no fracture Renal artery ultrasound: plaque buildup (08/2023) RAS right side  DIAGNOSTIC EKG: Atrial Fibrillation (12/13/2023) Assessment & Plan Chronic Anemia and fatigue Patient brings her lab results for the past few visits.  Chronic anemia with low red blood cells and high MCV. Normal iron levels. Hemoglobin and hematocrit stable. No immediate malignancy concern. Hematology consult ordered due to patient concern - Refer to hematologist in [place]  Chronic Kidney Disease Stage 3 Chronic kidney disease stage 3 with slightly abnormal kidney function, possibly due to renal artery blockage. patient can have this repeated.   Atrial Fibrillation Atrial fibrillation well-controlled with infrequent episodes. Under cardiologist Dr. Dorn Deiters care. - Schedule follow-up appointment with cardiologist Dr. Dorn Lesches.  Possible Costochondritis Tenderness and pain in ribcage area, likely costochondritis. X-ray showed no fractures or  pneumonia. Discussed Cymbalta  or gabapentin as options but patient declined starting medication.  Breast Cancer History of breast cancer treated in 2015 with lymph node removal. Regular mammograms show no recurrence.    The ASCVD Risk score (Arnett DK, et al., 2019) failed to calculate for the following reasons:   The 2019 ASCVD risk score is only valid for ages 27 to 33  Past Medical History:  Diagnosis Date   Acid reflux    Allergic rhinitis    Allergy    Anxiety    Arthritis    Asymptomatic bilateral carotid artery stenosis    40-59% (02/2018)   Atrial fibrillation (HCC) 07/2019   Breast cancer (HCC) 2015   Right Breast Cancer   CAD (coronary artery disease)    CKD (chronic kidney disease), stage III (HCC)    Depression    Emphysema    no longer uses inhalers   Gallstones    nausea, pain upper right abdomen   GERD (gastroesophageal reflux disease)    H/O hiatal hernia    Hiatal hernia    History of skin cancer    Hyperlipidemia    Hypertension    Multinodular goiter (nontoxic)    Osteoporosis    Scoliosis    Skin cancer    Tobacco user    Wears dentures    top   Past Surgical History:  Procedure Laterality Date   ABDOMINAL AORTOGRAM W/LOWER EXTREMITY N/A 02/05/2022   Procedure: ABDOMINAL AORTOGRAM W/LOWER EXTREMITY;  Surgeon: Lesches Dorn PARAS,  MD;  Location: [facility];  Service: Cardiovascular;  Laterality: N/A;   ATRIAL FIBRILLATION ABLATION N/A 04/14/2021   Procedure: ATRIAL FIBRILLATION ABLATION;  Surgeon: Inocencio Soyla Lunger, MD;  Location: [facility];  Service: Cardiovascular;  Laterality: N/A;   BREAST LUMPECTOMY Right 2015   CHOLECYSTECTOMY  07/08/2012   Procedure: LAPAROSCOPIC CHOLECYSTECTOMY WITH INTRAOPERATIVE CHOLANGIOGRAM;  Surgeon: Camellia CHRISTELLA Blush, MD,FACS;  Location: [facility];  Service: General;  Laterality: N/A;  Laparoscopic Cholecystectomy with Intraoperative Cholangiogram   COLONOSCOPY     ESOPHAGOSCOPY N/A 05/29/2017    Procedure: ESOPHAGOSCOPY;  Surgeon: Rosalie Kitchens, MD;  Location: [facility] ENDOSCOPY;  Service: Endoscopy;  Laterality: N/A;  botox  injection   EYE SURGERY     HAND SURGERY Left    wrist   LEFT HEART CATH AND CORONARY ANGIOGRAPHY N/A 10/11/2017   Procedure: LEFT HEART CATH AND CORONARY ANGIOGRAPHY;  Surgeon: [place], Peter M, MD;  Location: [facility] INVASIVE CV LAB;  Service: Cardiovascular;  Laterality: N/A;   TONSILECTOMY, ADENOIDECTOMY, BILATERAL MYRINGOTOMY AND TUBES     TUBAL LIGATION     Social History   Tobacco Use   Smoking status: Former    Current packs/day: 0.00    Average packs/day: 0.5 packs/day for 46.0 years (23.0 ttl pk-yrs)    Types: Cigarettes    Start date: 02/12/1965    Quit date: 02/13/2011    Years since quitting: 12.8   Smokeless tobacco: Never  Vaping Use   Vaping status: Never Used  Substance Use Topics   Alcohol use: No   Drug use: No   Family History  Problem Relation Age of Onset   Arthritis Mother    Pancreatic cancer Mother    Cancer Mother    Heart disease Other        5 brothers and 2 sister   Diverticulitis Sister    Heart disease Sister    Aplastic anemia Other    Heart attack Paternal Grandfather        Heart Attacl   Cancer Brother    Heart disease Brother    Heart disease Brother    Heart disease Sister    Heart disease Brother    Heart disease Brother    Cancer Brother    Heart disease Brother    Allergies  Allergen Reactions   Penicillins Anaphylaxis, Itching, Swelling and Rash    Has patient had a PCN reaction causing immediate rash, facial/tongue/throat swelling, SOB or lightheadedness with hypotension: Yes Has patient had a PCN reaction causing severe rash involving mucus membranes or skin necrosis: No Has patient had a PCN reaction that required hospitalization: Yes Has patient had a PCN reaction occurring within the last 10 years: No If all of the above answers are NO, then may proceed with Cephalosporin use.    Fosamax  [Alendronate ]      myalgias   Levofloxacin  Nausea Only and Other (See Comments)    Causes headache, fatigue and dizziness   Mexiletine Itching, Nausea Only and Other (See Comments)   Trimethoprim    Cefaclor Itching, Swelling and Rash   Latex Itching and Rash   Metronidazole Itching, Swelling and Rash   Naproxen Itching, Swelling and Rash   Nsaids Itching, Swelling and Rash    Ibuprofen  IS tolerated   Septra [Bactrim] Itching, Swelling and Rash   Sulfonamide Derivatives Itching, Swelling and Rash   Tape Itching and Rash    ONLY USE PAPER TAPE   Wound Dressing Adhesive Itching and Rash  ROS    Objective:    BP 130/82   Pulse 60   Temp 98.5 F (36.9 C)   Ht 4' 11 (1.499 m)   Wt 118 lb 4 oz (53.6 kg)   SpO2 99%   BMI 23.88 kg/m  BP Readings from Last 3 Encounters:  12/18/23 130/82  12/13/23 (!) 188/66  11/27/23 132/60   Wt Readings from Last 3 Encounters:  12/18/23 118 lb 4 oz (53.6 kg)  11/27/23 118 lb 9.6 oz (53.8 kg)  10/21/23 118 lb (53.5 kg)    Physical Exam Vitals and nursing note reviewed.  Constitutional:      Appearance: Normal appearance.  HENT:     Head: Normocephalic.     Right Ear: Tympanic membrane, ear canal and external ear normal.     Left Ear: Tympanic membrane, ear canal and external ear normal.  Eyes:     Extraocular Movements: Extraocular movements intact.     Pupils: Pupils are equal, round, and reactive to light.  Cardiovascular:     Rate and Rhythm: Normal rate and regular rhythm.     Heart sounds: Normal heart sounds.  Pulmonary:     Effort: Pulmonary effort is normal.     Breath sounds: Normal breath sounds. No wheezing.  Musculoskeletal:     Right lower leg: No edema.     Left lower leg: No edema.  Neurological:     General: No focal deficit present.     Mental Status: She is alert and oriented to person, place, and time.  Psychiatric:        Mood and Affect: Mood normal.        Behavior: Behavior normal.        Thought Content: Thought  content normal.        Judgment: Judgment normal.      No results found for any visits on 12/18/23.

## 2023-12-18 NOTE — Telephone Encounter (Signed)
 Second attempt.no answer. Lvm for call back.

## 2023-12-19 ENCOUNTER — Encounter: Payer: Self-pay | Admitting: Family Medicine

## 2023-12-19 NOTE — Telephone Encounter (Signed)
 Sent my chart message w/ physician recommendations.

## 2023-12-24 ENCOUNTER — Encounter: Payer: Self-pay | Admitting: Cardiovascular Disease

## 2023-12-24 ENCOUNTER — Ambulatory Visit: Attending: Cardiovascular Disease | Admitting: Cardiovascular Disease

## 2023-12-24 VITALS — BP 126/60 | HR 56 | Ht 59.0 in

## 2023-12-24 DIAGNOSIS — I10 Essential (primary) hypertension: Secondary | ICD-10-CM | POA: Insufficient documentation

## 2023-12-24 DIAGNOSIS — I739 Peripheral vascular disease, unspecified: Secondary | ICD-10-CM | POA: Diagnosis present

## 2023-12-24 DIAGNOSIS — I2510 Atherosclerotic heart disease of native coronary artery without angina pectoris: Secondary | ICD-10-CM | POA: Diagnosis not present

## 2023-12-24 DIAGNOSIS — E782 Mixed hyperlipidemia: Secondary | ICD-10-CM | POA: Insufficient documentation

## 2023-12-24 DIAGNOSIS — I6523 Occlusion and stenosis of bilateral carotid arteries: Secondary | ICD-10-CM | POA: Diagnosis not present

## 2023-12-24 NOTE — Assessment & Plan Note (Signed)
 History of essential hypertension with blood pressure measured today 126/60.  She is on valsartan .

## 2023-12-24 NOTE — Patient Instructions (Signed)
 Medication Instructions:  Your physician recommends that you continue on your current medications as directed. Please refer to the Current Medication list given to you today.  *If you need a refill on your cardiac medications before your next appointment, please call your pharmacy*   Testing/Procedures: Your physician has requested that you have a carotid duplex. This test is an ultrasound of the carotid arteries in your neck. It looks at blood flow through these arteries that supply the brain with blood. Allow one hour for this exam. There are no restrictions or special instructions. This will take place at [address] 4th floor  **To do in October**  Please note: We ask at that you not bring children with you during ultrasound (echo/ vascular) testing. Due to room size and safety concerns, children are not allowed in the ultrasound rooms during exams. Our front office staff cannot provide observation of children in our lobby area while testing is being conducted. An adult accompanying a patient to their appointment will only be allowed in the ultrasound room at the discretion of the ultrasound technician under special circumstances. We apologize for any inconvenience.   Follow-Up: At [facility], you and your health needs are our priority.  As part of our continuing mission to provide you with exceptional heart care, our providers are all part of one team.  This team includes your primary Cardiologist (physician) and Advanced Practice Providers or APPs (Physician Assistants and Nurse Practitioners) who all work together to provide you with the care you need, when you need it.  Your next appointment:   6 month(s)  Provider:   Jon Hails, PA-C, Callie Goodrich, PA-C, Kathleen Johnson, PA-C, Hao Meng, PA-C, Damien Braver, NP, or Katlyn West, NP     Then, Dorn Lesches, MD will plan to see you again in 12 month(s).   We recommend signing up for the patient portal called  MyChart.  Sign up information is provided on this After Visit Summary.  MyChart is used to connect with patients for Virtual Visits (Telemedicine).  Patients are able to view lab/test results, encounter notes, upcoming appointments, etc.  Non-urgent messages can be sent to your provider as well.   To learn more about what you can do with MyChart, go to [email or web address].

## 2023-12-24 NOTE — Assessment & Plan Note (Signed)
 History of PAF on Eliquis  oral anticoagulation.  She did have an episode of A-fib demonstrated at an ER visit 10/11/2023 which has since converted back to sinus bradycardia.

## 2023-12-24 NOTE — Assessment & Plan Note (Signed)
 History of moderate bilateral ICA stenosis by duplex ultrasound set 04/08/2023.  This will be repeated on an annual basis.

## 2023-12-24 NOTE — Assessment & Plan Note (Signed)
 History of peripheral arterial disease status post angiography 02/05/2022 showing there is appears to be a 50% second mental infrarenal abdominal aortic narrowing, 40% ostial right common iliac artery stenosis with a 30 mm pullback.  She was complaining of some claudication type symptoms as well as some dependent cyanosis in her feet.  I did order a CT of her abdomen which showed mild to moderate right renal artery stenosis and narrowing of her infrarenal renal abdominal aorta to approximately 67 mm.  This certainly could be contributing to her symptoms although I do not think she is a candidate for either endovascular or surgical correction at this time.

## 2023-12-24 NOTE — Progress Notes (Signed)
 12/24/2023 [name]   March 25, 1946  [id number]  Primary Physician Pickard, Butler DASEN, MD Primary Cardiologist: Dorn JINNY Lesches MD [facility]I [place], [place]  HPI:  Brittany Kirk is a 78 y.o.       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 She was referred by Dr. Duanne for cardiovascular evaluation because of chest pain. I last saw her in the office 07/31/2023.  Risk factors include 25 pack years of tobacco abuse having quit 3 years ago. She has treated hypertension as well as a strong family history of heart disease with multiple siblings who died at a premature age of myocardial infarction's. She has never had a heart attack or stroke. She has had a remote cardiac catheterization by Dr. Morris but no intervention was performed. She was complaining of some substernal chest pain with chronic nausea. This has since some sided after beginning on a proton pump inhibitor. She had a negative stress test 01/24/16 and again during a recent consultation for chest pain 01/05/17. She has had upper and lower endoscopy for abdominal pain and nausea which was unrevealing.   She had diagnostic coronary angiography performed by Dr. Swaziland 5/19 showing moderately severe ramus branch ostial disease with noncritical disease otherwise a normal LV function.  This was unchanged from prior cath performed in 2010 it was thought that her chest pain was noncardiac in nature.   She was seen in the emergency room on 07/15/2019 for A. fib with RVR.  She converted spontaneously to sinus rhythm and was begun on apixaban  by Dr. Lonni.  She has had a few episodes of brief AF since but for the most part is asymptomatic from this.   She was admitted in early August with A. fib with RVR and converted.  Her troponins were mildly elevated.  Her 2D echo was normal.  She has had no recurrent episodes.  She she  saw Dr. Inocencio for EP follow-up who had an A. fib ablation on 04/14/2021.  She saw Dr. Inocencio because of symptomatic PVCs who began her on mexiletine.  Unfortunately, this is exacerbated her baseline tremor.  Otherwise she has remained stable.  She also complains of reflux type symptoms on Prilosec.  She still sees Dr. Oneil Boots  a gastroenterologist for this.  Because of her intolerance to mexiletine she saw Dr. Inocencio again who began amiodarone .  He loaded her for 3 weeks and began her on 200 mg a day.  Since that time she has been symptomatically bradycardic.  She did see Scot Ford PA-C in the office 11/23/2021 complaining of dependent cyanosis and weakness in her legs.  Doppler studies performed 12/07/2021 revealed right ABI of 0.78 and left of 0.75 with high-frequency signals of both iliac arteries.   I performed peripheral angiography on her 02/05/2022 revealing 50% segmental in for renal abdominal aortic narrowing, 40% ostial right common iliac artery stenosis with a 30 mm pullback gradient across her aorta and iliac.  I do not think her iliac appears angiographically significant.  All her infrainguinal vasculature was widely patent.  I have no explanation for her dependent cyanosis.   Since I saw her in the office 6 months ago she complains of progressive weakness and numbness in her lower extremities bilaterally especially in her thighs and calves when she walks.  Her feet are cyanotic and cool to the touch.  I was  concerned that she has had progressive narrowing of her infrarenal abdominal aorta.  I am in to repeat her Doppler studies and aortoiliac CTA to further evaluate.  The CTA did show luminal narrowing at the 67 mm as well as mild to moderate right renal artery stenosis.  Aortoiliac Doppler studies performed 08/28/2023 showed a right ABI of 0.92, left of 0.87 with high-frequency signal in the distal abdominal aorta.  She was seen in the ER on 10/11/2023 and was in A-fib.  She is on Eliquis  for  PAF.  She is converted to sinus rhythm.    Allergies  Allergen Reactions   Metronidazole Itching, Rash and Swelling   Penicillins Anaphylaxis, Itching, Swelling and Rash    Has patient had a PCN reaction causing immediate rash, facial/tongue/throat swelling, SOB or lightheadedness with hypotension: Yes Has patient had a PCN reaction causing severe rash involving mucus membranes or skin necrosis: No Has patient had a PCN reaction that required hospitalization: Yes Has patient had a PCN reaction occurring within the last 10 years: No If all of the above answers are NO, then may proceed with Cephalosporin use.    Fosamax  [Alendronate ]     myalgias   Levofloxacin  Nausea Only and Other (See Comments)    Causes headache, fatigue and dizziness   Mexiletine Itching, Nausea Only and Other (See Comments)   Trimethoprim    Cefaclor Itching, Swelling and Rash   Latex Itching and Rash   Naproxen Itching, Swelling and Rash   Nsaids Itching, Swelling and Rash    Ibuprofen  IS tolerated   Septra [Bactrim] Itching, Swelling and Rash   Sulfonamide Derivatives Itching, Swelling and Rash   Tape Itching and Rash    ONLY USE PAPER TAPE   Wound Dressing Adhesive Itching and Rash    Social History   Socioeconomic History   Marital status: Married    Spouse name: Actor   Number of children: 1   Years of education: Not on file   Highest education level: Some college, no degree  Occupational History   Occupation: retired truck Nurse, learning disability: RETIRED  Tobacco Use   Smoking status: Former    Current packs/day: 0.00    Average packs/day: 0.5 packs/day for 46.0 years (23.0 ttl pk-yrs)    Types: Cigarettes    Start date: 02/12/1965    Quit date: 02/13/2011    Years since quitting: 12.8   Smokeless tobacco: Never  Vaping Use   Vaping status: Never Used  Substance and Sexual Activity   Alcohol use: No   Drug use: No   Sexual activity: Not Currently  Other Topics Concern   Not on file   Social History Narrative   Married since 1991, second marriage.    1 son   1 grandson   1 granddaughter   2 step children   3 step grandchildren   Social Drivers of Corporate investment banker Strain: Low Risk  (11/22/2023)   Overall Financial Resource Strain (CARDIA)    Difficulty of Paying Living Expenses: Not hard at all  Food Insecurity: No Food Insecurity (11/22/2023)   Hunger Vital Sign    Worried About Running Out of Food in the Last Year: Never true    Ran Out of Food in the Last Year: Never true  Transportation Needs: No Transportation Needs (11/22/2023)   PRAPARE - Administrator, Civil Service (Medical): No    Lack of Transportation (Non-Medical): No  Physical Activity: Insufficiently Active (  11/22/2023)   Exercise Vital Sign    Days of Exercise per Week: 2 days    Minutes of Exercise per Session: 20 min  Stress: Stress Concern Present (11/22/2023)   [employer] of Occupational Health - Occupational Stress Questionnaire    Feeling of Stress: To some extent  Social Connections: Moderately Integrated (11/22/2023)   Social Connection and Isolation Panel    Frequency of Communication with Friends and Family: Twice a week    Frequency of Social Gatherings with Friends and Family: Twice a week    Attends Religious Services: More than 4 times per year    Active Member of [employer] or Organizations: No    Attends Engineer, structural: Not on file    Marital Status: Married  Catering manager Violence: Not At Risk (07/12/2022)   Humiliation, Afraid, Rape, and Kick questionnaire    Fear of Current or Ex-Partner: No    Emotionally Abused: No    Physically Abused: No    Sexually Abused: No     Review of Systems: General: negative for chills, fever, night sweats or weight changes.  Cardiovascular: negative for chest pain, dyspnea on exertion, edema, orthopnea, palpitations, paroxysmal nocturnal dyspnea or shortness of breath Dermatological: negative for  rash Respiratory: negative for cough or wheezing Urologic: negative for hematuria Abdominal: negative for nausea, vomiting, diarrhea, bright red blood per rectum, melena, or hematemesis Neurologic: negative for visual changes, syncope, or dizziness All other systems reviewed and are otherwise negative except as noted above.    Blood pressure 126/60, pulse (!) 56, height 4' 11 (1.499 m), SpO2 95%.  General appearance: alert and no distress Neck: no adenopathy, no JVD, supple, symmetrical, trachea midline, thyroid  not enlarged, symmetric, no tenderness/mass/nodules, and soft bilateral carotid bruits Lungs: clear to auscultation bilaterally Heart: regular rate and rhythm, S1, S2 normal, no murmur, click, rub or gallop Extremities: extremities normal, atraumatic, no cyanosis or edema Pulses: 2+ and symmetric Skin: Skin color, texture, turgor normal. No rashes or lesions Neurologic: Grossly normal  EKG not performed today      ASSESSMENT AND PLAN:   CAD in native artery History of CAD status post cardiac catheterization from performing Dr. Swaziland 5/19 showing moderately severe ostial ramus branch disease but otherwise no critical disease and normal LV function.  She really denies chest pain.  Hyperlipidemia History of hyperlipidemia on atorvastatin  lipid profile performed/2/24 revealing total cholesterol 129, LDL 52 and HDL of 63.  Essential hypertension History of essential hypertension with blood pressure measured today 126/60.  She is on valsartan .  Bilateral carotid artery disease (HCC) History of moderate bilateral ICA stenosis by duplex ultrasound set 04/08/2023.  This will be repeated on an annual basis.  Atrial fibrillation with RVR (HCC) History of PAF on Eliquis  oral anticoagulation.  She did have an episode of A-fib demonstrated at an ER visit 10/11/2023 which has since converted back to sinus bradycardia.  Peripheral arterial disease (HCC) History of peripheral arterial  disease status post angiography 02/05/2022 showing there is appears to be a 50% second mental infrarenal abdominal aortic narrowing, 40% ostial right common iliac artery stenosis with a 30 mm pullback.  She was complaining of some claudication type symptoms as well as some dependent cyanosis in her feet.  I did order a CT of her abdomen which showed mild to moderate right renal artery stenosis and narrowing of her infrarenal renal abdominal aorta to approximately 67 mm.  This certainly could be contributing to her symptoms although I do not  think she is a candidate for either endovascular or surgical correction at this time.     Dorn DOROTHA Lesches MD FACP,FACC,FAHA, [facility] 12/24/2023 2:47 PM

## 2023-12-24 NOTE — Assessment & Plan Note (Signed)
 History of hyperlipidemia on atorvastatin  lipid profile performed/2/24 revealing total cholesterol 129, LDL 52 and HDL of 63.

## 2023-12-24 NOTE — Assessment & Plan Note (Signed)
 History of CAD status post cardiac catheterization from performing Dr. Swaziland 5/19 showing moderately severe ostial ramus branch disease but otherwise no critical disease and normal LV function.  She really denies chest pain.

## 2024-01-17 DIAGNOSIS — Z85828 Personal history of other malignant neoplasm of skin: Secondary | ICD-10-CM | POA: Diagnosis not present

## 2024-01-17 DIAGNOSIS — D1801 Hemangioma of skin and subcutaneous tissue: Secondary | ICD-10-CM | POA: Diagnosis not present

## 2024-01-17 DIAGNOSIS — L720 Epidermal cyst: Secondary | ICD-10-CM | POA: Diagnosis not present

## 2024-01-17 DIAGNOSIS — L821 Other seborrheic keratosis: Secondary | ICD-10-CM | POA: Diagnosis not present

## 2024-01-20 ENCOUNTER — Inpatient Hospital Stay: Attending: Oncology | Admitting: Oncology

## 2024-01-20 ENCOUNTER — Inpatient Hospital Stay

## 2024-01-20 VITALS — BP 128/76 | HR 56 | Temp 97.6°F | Resp 18 | Ht 59.0 in | Wt 118.9 lb

## 2024-01-20 DIAGNOSIS — D7589 Other specified diseases of blood and blood-forming organs: Secondary | ICD-10-CM

## 2024-01-20 DIAGNOSIS — I4891 Unspecified atrial fibrillation: Secondary | ICD-10-CM | POA: Insufficient documentation

## 2024-01-20 DIAGNOSIS — R638 Other symptoms and signs concerning food and fluid intake: Secondary | ICD-10-CM

## 2024-01-20 DIAGNOSIS — Z853 Personal history of malignant neoplasm of breast: Secondary | ICD-10-CM | POA: Insufficient documentation

## 2024-01-20 DIAGNOSIS — M810 Age-related osteoporosis without current pathological fracture: Secondary | ICD-10-CM | POA: Diagnosis not present

## 2024-01-20 DIAGNOSIS — D649 Anemia, unspecified: Secondary | ICD-10-CM | POA: Diagnosis not present

## 2024-01-20 DIAGNOSIS — R718 Other abnormality of red blood cells: Secondary | ICD-10-CM

## 2024-01-20 LAB — CBC WITH DIFFERENTIAL (CANCER CENTER ONLY)
Abs Immature Granulocytes: 0.04 K/uL (ref 0.00–0.07)
Basophils Absolute: 0 K/uL (ref 0.0–0.1)
Basophils Relative: 0 %
Eosinophils Absolute: 0.1 K/uL (ref 0.0–0.5)
Eosinophils Relative: 1 %
HCT: 39.2 % (ref 36.0–46.0)
Hemoglobin: 12.8 g/dL (ref 12.0–15.0)
Immature Granulocytes: 1 %
Lymphocytes Relative: 29 %
Lymphs Abs: 2 K/uL (ref 0.7–4.0)
MCH: 33.5 pg (ref 26.0–34.0)
MCHC: 32.7 g/dL (ref 30.0–36.0)
MCV: 102.6 fL — ABNORMAL HIGH (ref 80.0–100.0)
Monocytes Absolute: 0.4 K/uL (ref 0.1–1.0)
Monocytes Relative: 6 %
Neutro Abs: 4.3 K/uL (ref 1.7–7.7)
Neutrophils Relative %: 63 %
Platelet Count: 228 K/uL (ref 150–400)
RBC: 3.82 MIL/uL — ABNORMAL LOW (ref 3.87–5.11)
RDW: 13.6 % (ref 11.5–15.5)
WBC Count: 6.8 K/uL (ref 4.0–10.5)
nRBC: 0 % (ref 0.0–0.2)

## 2024-01-20 LAB — CMP (CANCER CENTER ONLY)
ALT: 11 U/L (ref 0–44)
AST: 19 U/L (ref 15–41)
Albumin: 4.2 g/dL (ref 3.5–5.0)
Alkaline Phosphatase: 54 U/L (ref 38–126)
Anion gap: 11 (ref 5–15)
BUN: 21 mg/dL (ref 8–23)
CO2: 25 mmol/L (ref 22–32)
Calcium: 9.8 mg/dL (ref 8.9–10.3)
Chloride: 105 mmol/L (ref 98–111)
Creatinine: 1.06 mg/dL — ABNORMAL HIGH (ref 0.44–1.00)
GFR, Estimated: 53 mL/min — ABNORMAL LOW (ref >60)
Glucose, Bld: 98 mg/dL (ref 70–99)
Potassium: 4.2 mmol/L (ref 3.5–5.1)
Sodium: 141 mmol/L (ref 135–145)
Total Bilirubin: 0.7 mg/dL (ref 0.0–1.2)
Total Protein: 7 g/dL (ref 6.5–8.1)

## 2024-01-20 LAB — VITAMIN B12: Vitamin B-12: 693 pg/mL (ref 180–914)

## 2024-01-20 LAB — FOLATE: Folate: 20.7 ng/mL (ref >5.9)

## 2024-01-20 LAB — IRON AND TIBC
Iron: 76 ug/dL (ref 28–170)
Saturation Ratios: 22 % (ref 10.4–31.8)
TIBC: 342 ug/dL (ref 250–450)
UIBC: 266 ug/dL

## 2024-01-20 LAB — LACTATE DEHYDROGENASE: LDH: 173 U/L (ref 98–192)

## 2024-01-20 LAB — FERRITIN: Ferritin: 51 ng/mL (ref 11–307)

## 2024-01-20 NOTE — Progress Notes (Unsigned)
 [facility]  HEMATOLOGY CLINIC CONSULTATION NOTE   PATIENT NAME: Brittany Kirk   MR#: [id number] DOB: [birth year]  DATE OF SERVICE: 01/20/2024   REFERRING PROVIDER  Connie Emperor, MD  Patient Care Team: Duanne Butler DASEN, MD as PCP - General (Family Medicine) Court Dorn PARAS, MD as PCP - Cardiology (Cardiology) Inocencio Soyla Lunger, MD as PCP - Electrophysiology (Cardiology) Keenan Hastings, MD as Consulting Physician (Radiation Oncology) Mat Browning, MD as Consulting Physician (Obstetrics and Gynecology) Rosalie Kitchens, MD as Consulting Physician (Gastroenterology) Nicholaus Sherlean CROME, [facility] (Inactive) as Pharmacist (Pharmacist) Cleotilde Elspeth CROME, OD (Optometry)   REASON FOR CONSULTATION/ CHIEF COMPLAINT: ***   ASSESSMENT & PLAN:  Brittany Kirk is a 78 y.o. female, with a past medical history of ***, was referred to our service for evaluation of ***.    No problem-specific Assessment & Plan notes found for this encounter.   Assessment and Plan Assessment & Plan   We will send the work-up for anemia, including ferritin, iron studies, vitamin B12 and folate level, MMA, SPEP and IFE, haptoglobin, reticulocyte count,   I reviewed lab results and outside records for this visit and discussed relevant results with the patient. Diagnosis, plan of care and treatment options were also discussed in detail with the patient. Opportunity provided to ask questions and answers provided to her apparent satisfaction. Provided instructions to call our clinic with any problems, questions or concerns prior to return visit. I recommended to continue follow-up with PCP and sub-specialists. She verbalized understanding and agreed with the plan. No barriers to learning was detected.  Brittany Patten, MD  01/20/2024 1:29 PM  [facility] A DEPT OF [facility] [address] [place]  [address] Dept: [phone] Dept Fax: [phone]   HISTORY OF PRESENT ILLNESS: ***  Discussed the use of AI scribe software for clinical note transcription with the patient, who gave verbal consent to proceed.  History of Present Illness      She denies fever, cough, diarrhea, or other infectious symptoms.  She denies epistaxis, bloody stool, melena, hematuria, bruising or other bleeding symptoms. She also denies unintentional weight loss, night sweats or other constitutional symptoms.  MEDICAL HISTORY Past Medical History:  Diagnosis Date   Acid reflux    Allergic rhinitis    Allergy    Anxiety    Arthritis    Asymptomatic bilateral carotid artery stenosis    40-59% (02/2018)   Atrial fibrillation (HCC) 07/2019   Breast cancer (HCC) 2015   Right Breast Cancer   CAD (coronary artery disease)    CKD (chronic kidney disease), stage III (HCC)    Depression    Emphysema    no longer uses inhalers   Gallstones    nausea, pain upper right abdomen   GERD (gastroesophageal reflux disease)    H/O hiatal hernia    Hiatal hernia    History of skin cancer    Hyperlipidemia    Hypertension    Multinodular goiter (nontoxic)    Osteoporosis    Scoliosis    Skin cancer    Tobacco user    Wears dentures    top     SURGICAL HISTORY Past Surgical History:  Procedure Laterality Date   ABDOMINAL AORTOGRAM W/LOWER EXTREMITY N/A 02/05/2022   Procedure: ABDOMINAL AORTOGRAM W/LOWER EXTREMITY;  Surgeon: Court Dorn PARAS, MD;  Location: [facility] INVASIVE CV LAB;  Service: Cardiovascular;  Laterality: N/A;  ATRIAL FIBRILLATION ABLATION N/A 04/14/2021   Procedure: ATRIAL FIBRILLATION ABLATION;  Surgeon: Inocencio Soyla Lunger, MD;  Location: [facility] INVASIVE CV LAB;  Service: Cardiovascular;  Laterality: N/A;   BREAST LUMPECTOMY Right 2015   CHOLECYSTECTOMY  07/08/2012   Procedure: LAPAROSCOPIC CHOLECYSTECTOMY WITH INTRAOPERATIVE CHOLANGIOGRAM;  Surgeon: Camellia CHRISTELLA Blush, MD,FACS;  Location: [facility]  ORS;  Service: General;  Laterality: N/A;  Laparoscopic Cholecystectomy with Intraoperative Cholangiogram   COLONOSCOPY     ESOPHAGOSCOPY N/A 05/29/2017   Procedure: ESOPHAGOSCOPY;  Surgeon: Rosalie Kitchens, MD;  Location: [facility] ENDOSCOPY;  Service: Endoscopy;  Laterality: N/A;  botox  injection   EYE SURGERY     HAND SURGERY Left    wrist   LEFT HEART CATH AND CORONARY ANGIOGRAPHY N/A 10/11/2017   Procedure: LEFT HEART CATH AND CORONARY ANGIOGRAPHY;  Surgeon: Swaziland, Peter M, MD;  Location[facility] INVASIVE CV LAB;  Service: Cardiovascular;  Laterality: N/A;   TONSILECTOMY, ADENOIDECTOMY, BILATERAL MYRINGOTOMY AND TUBES     TUBAL LIGATION       SOCIAL HISTORY: She reports that she quit smoking about 12 years ago. Her smoking use included cigarettes. She started smoking about 58 years ago. She has a 23 pack-year smoking history. She has never used smokeless tobacco. She reports that she does not drink alcohol and does not use drugs. Social History   Socioeconomic History   Marital status: Married    Spouse name: Actor   Number of children: 1   Years of education: Not on file   Highest education level: Some college, no degree  Occupational History   Occupation: retired truck Nurse, learning disability: RETIRED  Tobacco Use   Smoking status: Former    Current packs/day: 0.00    Average packs/day: 0.5 packs/day for 46.0 years (23.0 ttl pk-yrs)    Types: Cigarettes    Start date: 02/12/1965    Quit date: 02/13/2011    Years since quitting: 12.9   Smokeless tobacco: Never  Vaping Use   Vaping status: Never Used  Substance and Sexual Activity   Alcohol use: No   Drug use: No   Sexual activity: Not Currently  Other Topics Concern   Not on file  Social History Narrative   Married since 1991, second marriage.    1 son   1 grandson   1 granddaughter   2 step children   3 step grandchildren   Social Drivers of Corporate investment banker Strain: Low Risk  (11/22/2023)   Overall Financial  Resource Strain (CARDIA)    Difficulty of Paying Living Expenses: Not hard at all  Food Insecurity: No Food Insecurity (11/22/2023)   Hunger Vital Sign    Worried About Running Out of Food in the Last Year: Never true    Ran Out of Food in the Last Year: Never true  Transportation Needs: No Transportation Needs (11/22/2023)   PRAPARE - Administrator, Civil Service (Medical): No    Lack of Transportation (Non-Medical): No  Physical Activity: Insufficiently Active (11/22/2023)   Exercise Vital Sign    Days of Exercise per Week: 2 days    Minutes of Exercise per Session: 20 min  Stress: Stress Concern Present (11/22/2023)   [employer] of Occupational Health - Occupational Stress Questionnaire    Feeling of Stress: To some extent  Social Connections: Moderately Integrated (11/22/2023)   Social Connection and Isolation Panel    Frequency of Communication with Friends and Family: Twice a week    Frequency of  Social Gatherings with Friends and Family: Twice a week    Attends Religious Services: More than 4 times per year    Active Member of [employer] or Organizations: No    Attends Engineer, structural: Not on file    Marital Status: Married  Catering manager Violence: Not At Risk (07/12/2022)   Humiliation, Afraid, Rape, and Kick questionnaire    Fear of Current or Ex-Partner: No    Emotionally Abused: No    Physically Abused: No    Sexually Abused: No    FAMILY HISTORY: Her family history includes Aplastic anemia in an other family member; Arthritis in her mother; Cancer in her brother, brother, and mother; Diverticulitis in her sister; Heart attack in her paternal grandfather; Heart disease in her brother, brother, brother, brother, brother, sister, sister, and another family member; Pancreatic cancer in her mother.  CURRENT MEDICATIONS   Current Outpatient Medications  Medication Instructions   acetaminophen  (TYLENOL ) 1,000 mg, Every 6 hours PRN   apixaban   (ELIQUIS ) 5 mg, Oral, 2 times daily   ascorbic acid  (VITAMIN C) 1,000 mg, Daily   atorvastatin  (LIPITOR) 40 mg, Oral, Daily   cholecalciferol  (VITAMIN D3) 1,000 Units, Daily   diazepam  (VALIUM ) 5 mg, As needed   ferrous sulfate 325 mg, Daily after lunch   Folic Acid -B12-B6-Arginine 0.067-0.017-3.3 1000 MG TABS 1,000 mg, Daily   mupirocin  ointment (BACTROBAN ) 2 % 1 Application, 3 times daily   omeprazole (PRILOSEC) 20 mg, Daily   valsartan  (DIOVAN ) 40 mg, Oral, Daily, Hold for Blood Pressure <120     ALLERGIES  She is allergic to metronidazole, penicillins, fosamax  [alendronate ], levofloxacin , mexiletine, trimethoprim, cefaclor, latex, naproxen, nsaids, septra [bactrim], sulfonamide derivatives, tape, and wound dressing adhesive.  REVIEW OF SYSTEMS:  Review of Systems - Oncology   Rest of the pertinent review of systems is unremarkable except as mentioned above in HPI.  PHYSICAL EXAMINATION:    Onc Performance Status - 01/20/24 1300       KPS SCALE   KPS % SCORE Cares for self, unable to carry on normal activity or to do active work          Vitals:   01/20/24 1259  BP: 128/76  Pulse: (!) 56  Resp: 18  Temp: 97.6 F (36.4 C)  SpO2: 91%   Filed Weights   01/20/24 1259  Weight: 118 lb 14.4 oz (53.9 kg)    Physical Exam  ***  LABORATORY DATA:   I have reviewed the data as listed.  No results found for any visits on 01/20/24.   RADIOGRAPHIC STUDIES:  No pertinent imaging studies available to review.  No orders of the defined types were placed in this encounter.   Future Appointments  Date Time Provider Department Center  01/20/2024  2:00 PM [facility] PHLEBOTOMIST [facility] None  03/24/2024  3:30 PM [facility] H[facility]V    I spent a total of 40 minutes during this encounter with the patient including review of chart and various tests results, discussions about plan of care and coordination of care plan.  This document was completed utilizing  speech recognition software. Grammatical errors, random word insertions, pronoun errors, and incomplete sentences are an occasional consequence of this system due to software limitations, ambient noise, and hardware issues. Any formal questions or concerns about the content, text or information contained within the body of this dictation should be directly addressed to the provider for clarification.

## 2024-01-22 ENCOUNTER — Encounter: Payer: Self-pay | Admitting: Oncology

## 2024-01-26 LAB — METHYLMALONIC ACID, SERUM: Methylmalonic Acid, Quantitative: 164 nmol/L (ref 0–378)

## 2024-01-27 ENCOUNTER — Other Ambulatory Visit: Payer: Self-pay | Admitting: Obstetrics and Gynecology

## 2024-01-27 ENCOUNTER — Encounter: Payer: Self-pay | Admitting: Oncology

## 2024-01-27 ENCOUNTER — Telehealth: Admitting: Oncology

## 2024-01-27 DIAGNOSIS — N644 Mastodynia: Secondary | ICD-10-CM | POA: Diagnosis not present

## 2024-01-27 DIAGNOSIS — D7589 Other specified diseases of blood and blood-forming organs: Secondary | ICD-10-CM | POA: Insufficient documentation

## 2024-01-27 DIAGNOSIS — Z124 Encounter for screening for malignant neoplasm of cervix: Secondary | ICD-10-CM | POA: Diagnosis not present

## 2024-01-27 DIAGNOSIS — Z6824 Body mass index (BMI) 24.0-24.9, adult: Secondary | ICD-10-CM | POA: Diagnosis not present

## 2024-01-27 DIAGNOSIS — Z779 Other contact with and (suspected) exposures hazardous to health: Secondary | ICD-10-CM | POA: Diagnosis not present

## 2024-01-27 NOTE — Assessment & Plan Note (Signed)
 Macrocytosis with fluctuating hemoglobin levels. Hemoglobin was normal at 12.3 g/dL during the last check, but previously low at 10.8 g/dL. Red blood cell size is enlarged, possibly due to medication effects, improper B12 utilization, or high cell turnover. Iron levels were normal, and B12 was elevated. Iron supplementation is not beneficial due to normal iron levels and experienced side effects.  - Labs today showed normal hemoglobin of 12.8.  MCV remains slightly elevated at 102.6.  White count and platelet count are within normal limits.  CMP unremarkable.  Vitamin B12, folate are within normal limits.  Iron studies normal.  Methylmalonic acid pending.  - It is likely that mild macrocytosis is from high cell turnover.  Clinical picture not concerning for MDS or other bone marrow disorders at this time.  - I will discuss results of remaining workup over the phone next week.  Continue to monitor for now.

## 2024-01-28 ENCOUNTER — Inpatient Hospital Stay (HOSPITAL_BASED_OUTPATIENT_CLINIC_OR_DEPARTMENT_OTHER): Admitting: Oncology

## 2024-01-28 ENCOUNTER — Encounter: Payer: Self-pay | Admitting: Oncology

## 2024-01-28 DIAGNOSIS — D7589 Other specified diseases of blood and blood-forming organs: Secondary | ICD-10-CM

## 2024-01-28 NOTE — Progress Notes (Signed)
 [facility]  HEMATOLOGY-ONCOLOGY ELECTRONIC VISIT PROGRESS NOTE  PATIENT NAME: Brittany Kirk   MR#: [id number] DOB: 1946/05/03  DATE OF SERVICE: 01/28/2024  Patient Care Team: Duanne Butler DASEN, MD as PCP - General (Family Medicine) Court Dorn PARAS, MD as PCP - Cardiology (Cardiology) Inocencio Soyla Lunger, MD as PCP - Electrophysiology (Cardiology) Keenan Hastings, MD as Consulting Physician (Radiation Oncology) Mat Browning, MD as Consulting Physician (Obstetrics and Gynecology) Rosalie Kitchens, MD as Consulting Physician (Gastroenterology) Nicholaus Sherlean CROME, [facility] (Inactive) as Pharmacist (Pharmacist) Cleotilde Elspeth CROME, OD (Optometry)  I connected with the patient via telephone conference and verified that I am speaking with the correct person using two identifiers. The patient's location is at home and I am providing care from the [facility].  I discussed the limitations, risks, security and privacy concerns of performing an evaluation and management service by e-visits and the availability of in person appointments. I also discussed with the patient that there may be a patient responsible charge related to this service. The patient expressed understanding and agreed to proceed.   ASSESSMENT & PLAN:   Brittany Kirk is a 78 y.o. lady with a past medical history of CAD, CKD stage III, remote history of right sided breast cancer in 2015, GERD, hypertension, dyslipidemia, osteoporosis, was referred to our service for evaluation of RBC macrocytosis.    Macrocytosis Macrocytosis with fluctuating hemoglobin levels. Hemoglobin was normal at 12.3 g/dL during the last check, but previously low at 10.8 g/dL. Red blood cell size is enlarged, possibly due to medication effects, improper B12 utilization, or high cell turnover. Iron levels were normal, and B12 was elevated. Iron supplementation is not beneficial due to normal iron levels and experienced side effects.  On  her consultation with us  on 01/20/2024, labs showed normal hemoglobin of 12.8.  MCV remains slightly elevated at 102.6.  White count and platelet count were within normal limits.  CMP unremarkable.  Vitamin B12, folate are within normal limits.  Iron studies normal.  Methylmalonic acid was also normal.  - It is likely that mild macrocytosis is from high cell turnover.  Clinical picture not concerning for MDS or other bone marrow disorders at this time.  Continue to monitor for now.  RTC in November as scheduled.   Right upper quadrant abdominal pain Right upper quadrant abdominal pain reported, with concern about potential liver or kidney involvement. Liver function tests are normal, including alkaline phosphatase, AST, ALT, and total bilirubin. - Proceed with scheduled diagnostic mammogram and ultrasound. - Ensure results are sent to the hematology and oncology office.  Breast pain Breast pain present, with recent evaluation by gynecologist. Scheduled for diagnostic mammogram and ultrasound to further evaluate. - Proceed with scheduled diagnostic mammogram and ultrasound. - Ensure results are sent to the hematology and oncology office.  Mildly elevated creatinine Creatinine level is slightly elevated at 1.06 mg/dL, flagged as high due to low muscle mass. GFR is low, likely due to dehydration. No significant kidney dysfunction indicated. - Increase fluid intake to 70-80 ounces of water or non-caffeinated drinks daily.    I discussed the assessment and treatment plan with the patient. The patient was provided an opportunity to ask questions and all were answered. The patient agreed with the plan and demonstrated an understanding of the instructions. The patient was advised to call back or seek an in-person evaluation if the symptoms worsen or if the condition fails to improve as anticipated.    I spent 12 minutes  over the phone with the patient reviewing test results, discuss management and  coordination/planning of care.  [name], MD 01/28/2024 4:46 PM [facility] - A DEPT OF [facility] [address] [employer] [facility] [employer] [place] [address] Dept: [phone] Dept Fax: [phone]   INTERVAL HISTORY:  Please see above for problem oriented charting.  The purpose of today's discussion is to explain recent lab results and to formulate plan of care.  Discussed the use of AI scribe software for clinical note transcription with the patient, who gave verbal consent to proceed.  History of Present Illness Brittany Kirk is an 78 year old female who presents for evaluation of macrocytosis. She was referred by her primary doctor for evaluation of macrocytosis.  Her red blood cell size is 102 fL, where the normal range is 80-100 fL. Her hemoglobin level is normal at 12.8 g/dL, and her red blood cell count is slightly below the normal range at 3.82 million cells/mcL. A comprehensive workup including vitamin B12, folic acid , and iron levels was normal. Kidney and liver function tests, as well as platelet and white blood cell counts, were also normal.  She experiences pain on the right side, specifically in the right abdomen below the upper rib cage. She has seen her gynecologist for breast pain and is scheduled for a diagnostic mammogram and ultrasound next Tuesday.  She is currently taking vitamin B12 and inquires about continuing iron, B6, and B9 supplements. Her iron levels are normal.    SUMMARY OF HEMATOLOGY HISTORY:  She was referred by her primary care physician for evaluation of abnormal red blood cell size.   She reports that her primary care physician told her she has chronic anemia, and that her last two blood tests showed slightly abnormal red blood cell size. Despite this, her B12 levels are high and iron levels are normal. She experiences side effects such as headaches, nausea, and constipation from iron  supplements.   She experiences rib pain and has a history of breast cancer. She reports fatigue and a significant decrease in energy, impacting her ability to maintain her household. 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 She recently stopped taking prescription Prilosec as she no longer experiences acid reflux.   Her weight has been stable around 116-118 pounds, although she has been trying to eat more to maintain her weight. No alcohol consumption.  On her consultation with us  on 01/20/2024, labs showed normal hemoglobin of 12.8.  MCV remains slightly elevated at 102.6.  White count and platelet count were within normal limits.  CMP unremarkable.  Vitamin B12, folate are within normal limits.  Iron studies normal.  Methylmalonic acid was also normal.  - It is likely that mild macrocytosis is from high cell turnover.  Clinical picture not concerning for MDS or other bone marrow disorders at this time.  Continue to monitor for now.  RTC in November as scheduled.  REVIEW OF SYSTEMS:    Review of Systems - Oncology  All other pertinent systems were reviewed with the patient and are negative.  I have reviewed the past medical history, past surgical history, social history and family history with the patient and they are unchanged from previous note.  ALLERGIES:  She is allergic to  metronidazole, penicillins, fosamax  [alendronate ], levofloxacin , mexiletine, trimethoprim, cefaclor, latex, naproxen, nsaids, septra [bactrim], sulfonamide derivatives, tape, and wound dressing adhesive.  MEDICATIONS:  Current Outpatient Medications  Medication Sig Dispense Refill   acetaminophen  (TYLENOL ) 500 MG tablet Take 1,000 mg by mouth every 6 (six) hours as needed for moderate pain or headache.      apixaban  (ELIQUIS ) 5 MG TABS tablet Take 1 tablet (5 mg total) by mouth 2 (two) times daily. 180 tablet 0   ascorbic acid  (VITAMIN C) 1000 MG tablet Take 1,000 mg by mouth daily.     atorvastatin  (LIPITOR) 40 MG tablet Take 1 tablet (40 mg total) by mouth daily. 90 tablet 1   cholecalciferol  (VITAMIN D3) 25 MCG (1000 UNIT) tablet Take 1,000 Units by mouth daily. (Patient taking differently: Take 1,000 Units by mouth daily. Patient reports that she takes every other day.)     diazepam  (VALIUM ) 5 MG tablet Take 5 mg by mouth as needed for sedation.     ferrous sulfate 325 (65 FE) MG tablet Take 325 mg by mouth daily after lunch. (Patient taking differently: Take 325 mg by mouth daily after lunch. Takes 1 every 3 days due to side effects)     Folic Acid -B12-B6-Arginine 0.067-0.017-3.3 1000 MG TABS Take 1,000 mg by mouth daily. (Patient taking differently: Take 1,000 mg by mouth daily. Patient seems confused about this medication. Did not realize it has B-12 and B-6 combined.)     mupirocin  ointment (BACTROBAN ) 2 % Apply 1 Application topically 3 (three) times daily.     omeprazole (PRILOSEC) 20 MG capsule Take 20 mg by mouth daily.     valsartan  (DIOVAN ) 40 MG tablet Take 1 tablet (40 mg total) by mouth daily. Hold for Blood Pressure <120 (Patient taking differently: Take 40 mg by mouth daily. Hold for Blood Pressure <120) 90 tablet 3   No current facility-administered medications for this visit.    PHYSICAL EXAMINATION:   Onc Performance Status - 01/28/24 1500       ECOG Perf Status   ECOG Perf Status Restricted in physically strenuous activity but ambulatory and able to carry out work of a light or sedentary nature, e.g., light house work, office work      KPS SCALE   KPS % SCORE Normal activity with effort, some s/s of disease          LABORATORY DATA:   I have reviewed the data as listed.  Recent Results (from the past 2160 hours)  Urine Culture     Status: None   Collection Time:  11/27/23  1:58 PM   Specimen: Urine  Result Value Ref Range   MICRO NUMBER: [id number]    SPECIMEN QUALITY: Adequate    Sample Source NOT GIVEN    STATUS: FINAL    Result: No Growth   Urinalysis, Routine w reflex microscopic     Status: Abnormal   Collection Time: 11/27/23  1:58 PM  Result Value Ref Range   Color, Urine YELLOW YELLOW   APPearance CLEAR CLEAR   Specific Gravity, Urine 1.020 1.001 - 1.035   pH 5.5 5.0 - 8.0   Glucose, UA NEGATIVE NEGATIVE   Bilirubin Urine 1+ (A) NEGATIVE    Comment: Presumptive positive bilirubin. Consider confirmation  by serum bilirubin if clinically indicated.    Ketones, ur TRACE (A) NEGATIVE   Hgb urine dipstick NEGATIVE NEGATIVE   Protein, ur TRACE (A) NEGATIVE   Nitrite NEGATIVE NEGATIVE   Leukocytes,Ua TRACE (A) NEGATIVE  WBC, UA 0-5 0 - 5 /HPF   RBC / HPF NONE SEEN 0 - 2 /HPF   Squamous Epithelial / HPF 0-5 < OR = 5 /HPF   Bacteria, UA FEW (A) NONE SEEN /HPF   Hyaline Cast 1-3 (A) NONE SEEN /LPF  Microscopic Message     Status: None   Collection Time: 11/27/23  1:58 PM  Result Value Ref Range   Note      Comment: This urine was analyzed for the presence of WBC,  RBC, bacteria, casts, and other formed elements.  Only those elements seen were reported. . .   POC urinalysis dipstick     Status: Abnormal   Collection Time: 12/13/23  5:20 PM  Result Value Ref Range   Color, UA yellow yellow   Clarity, UA clear clear   Glucose, UA negative negative mg/dL   Bilirubin, UA negative negative   Ketones, POC UA negative negative mg/dL   Spec Grav, UA <=8.994 (A) 1.010 - 1.025   Blood, UA negative negative   pH, UA 5.0 5.0 - 8.0   Protein Ur, POC negative negative mg/dL   Urobilinogen, UA 0.2 0.2 or 1.0 E.U./dL   Nitrite, UA Negative Negative   Leukocytes, UA Negative Negative  CBC with Differential     Status: Abnormal   Collection Time: 12/13/23  8:00 PM  Result Value Ref Range   WBC 7.6 4.0 - 10.5 K/uL   RBC 3.66 (L) 3.87 -  5.11 MIL/uL   Hemoglobin 12.3 12.0 - 15.0 g/dL   HCT 63.0 63.9 - 53.9 %   MCV 100.8 (H) 80.0 - 100.0 fL   MCH 33.6 26.0 - 34.0 pg   MCHC 33.3 30.0 - 36.0 g/dL   RDW 86.5 88.4 - 84.4 %   Platelets 220 150 - 400 K/uL   nRBC 0.0 0.0 - 0.2 %   Neutrophils Relative % 60 %   Neutro Abs 4.6 1.7 - 7.7 K/uL   Lymphocytes Relative 31 %   Lymphs Abs 2.4 0.7 - 4.0 K/uL   Monocytes Relative 7 %   Monocytes Absolute 0.6 0.1 - 1.0 K/uL   Eosinophils Relative 1 %   Eosinophils Absolute 0.1 0.0 - 0.5 K/uL   Basophils Relative 0 %   Basophils Absolute 0.0 0.0 - 0.1 K/uL   Immature Granulocytes 1 %   Abs Immature Granulocytes 0.05 0.00 - 0.07 K/uL    Comment: Performed at [facility], [address], [place], [place] [address]  Comprehensive metabolic panel     Status: Abnormal   Collection Time: 12/13/23  8:00 PM  Result Value Ref Range   Sodium 135 135 - 145 mmol/L   Potassium 4.2 3.5 - 5.1 mmol/L   Chloride 100 98 - 111 mmol/L   CO2 23 22 - 32 mmol/L   Glucose, Bld 89 70 - 99 mg/dL    Comment: Glucose reference range applies only to samples taken after fasting for at least 8 hours.   BUN 18 8 - 23 mg/dL   Creatinine, Ser 8.86 (H) 0.44 - 1.00 mg/dL   Calcium  9.0 8.9 - 10.3 mg/dL   Total Protein 6.4 (L) 6.5 - 8.1 g/dL   Albumin 3.6 3.5 - 5.0 g/dL   AST 26 15 - 41 U/L   ALT 14 0 - 44 U/L   Alkaline Phosphatase 39 38 - 126 U/L   Total Bilirubin 0.5 0.0 - 1.2 mg/dL   GFR, Estimated 49 (L) >60 mL/min  Comment: (NOTE) Calculated using the CKD-EPI Creatinine Equation (2021)    Anion gap 12 5 - 15    Comment: Performed at [facility], [address], [place], [place] [address]  CK     Status: None   Collection Time: 12/13/23  8:00 PM  Result Value Ref Range   Total CK 44 38 - 234 U/L    Comment: Performed at [facility], [address] [place], [place] [address]  T4, free     Status: Abnormal   Collection Time: 12/13/23  8:00 PM  Result Value Ref Range   Free T4  1.20 (H) 0.61 - 1.12 ng/dL    Comment: (NOTE) Biotin ingestion may interfere with free T4 tests. If the results are inconsistent with the TSH level, previous test results, or the clinical presentation, then consider biotin interference. If needed, order repeat testing after stopping biotin. Performed at [facility], [address], [place], [place] [address]   TSH     Status: None   Collection Time: 12/13/23  8:00 PM  Result Value Ref Range   TSH 0.601 0.350 - 4.500 uIU/mL    Comment: Performed by a 3rd Generation assay with a functional sensitivity of <=0.01 uIU/mL. Performed at [facility], [address], [place], [place] [address]   Lactate dehydrogenase     Status: None   Collection Time: 01/20/24  2:19 PM  Result Value Ref Range   LDH 173 98 - 192 U/L    Comment: Performed at [facility], [address], [place], [place] [address]  Ferritin     Status: None   Collection Time: 01/20/24  2:19 PM  Result Value Ref Range   Ferritin 51 11 - 307 ng/mL    Comment: Performed at [facility], [address] [place], [place] [address]  Iron and TIBC     Status: None   Collection Time: 01/20/24  2:19 PM  Result Value Ref Range   Iron 76 28 - 170 ug/dL   TIBC 657 749 - 549 ug/dL   Saturation Ratios 22 10.4 - 31.8 %   UIBC 266 ug/dL    Comment: Performed at [facility], [address], [place], [place] [address]  Vitamin B12     Status: None   Collection Time: 01/20/24  2:19 PM  Result Value Ref Range   Vitamin B-12 693 180 - 914 pg/mL    Comment: (NOTE) This assay is not validated for testing neonatal or myeloproliferative syndrome specimens for Vitamin B12 levels. Performed at [facility], [address], [place], [place] [address]   Folate     Status: None   Collection Time: 01/20/24  2:19 PM  Result Value Ref Range   Folate 20.7 >5.9 ng/mL    Comment: Performed at [facility], [address], [place], [place] [address]  Methylmalonic acid, serum     Status: None   Collection Time: 01/20/24  2:19 PM  Result Value Ref Range   Methylmalonic Acid, Quantitative 164 0 - 378 nmol/L    Comment: (NOTE) This test was developed and its performance characteristics determined by [facility]. It has not been cleared or approved by the Food and Drug Administration. Performed At: [facility] [address] [place], [place] [id number] Jennette Shorter MD Ey:[id number]   CMP (Cancer Center only)     Status: Abnormal   Collection Time: 01/20/24  2:19 PM  Result Value Ref Range   Sodium 141 135 - 145 mmol/L   Potassium 4.2 3.5 - 5.1 mmol/L   Chloride 105 98 - 111 mmol/L   CO2 25 22 - 32 mmol/L   Glucose, Bld 98 70 - 99 mg/dL    Comment: Glucose reference range applies only to samples taken after fasting for at least 8 hours.   BUN 21 8 - 23 mg/dL   Creatinine 8.93 (H) 9.55 - 1.00 mg/dL   Calcium  9.8 8.9 - 10.3 mg/dL   Total Protein 7.0 6.5 - 8.1 g/dL   Albumin 4.2 3.5 - 5.0 g/dL   AST 19 15 - 41 U/L   ALT 11 0 - 44 U/L   Alkaline Phosphatase 54 38 - 126 U/L   Total Bilirubin 0.7 0.0 - 1.2 mg/dL   GFR, Estimated 53 (L) >60 mL/min    Comment: (NOTE) Calculated using the CKD-EPI Creatinine Equation (2021)    Anion gap 11 5 - 15    Comment: Performed at [facility], [address], [place], [place] [address]  CBC with Differential (Cancer Center Only)     Status: Abnormal   Collection Time: 01/20/24  2:19 PM  Result Value Ref Range   WBC Count 6.8 4.0 - 10.5 K/uL   RBC 3.82 (L) 3.87 - 5.11 MIL/uL   Hemoglobin 12.8 12.0 - 15.0 g/dL   HCT 60.7 63.9 - 53.9 %   MCV 102.6 (H) 80.0 - 100.0 fL   MCH 33.5 26.0 - 34.0 pg   MCHC 32.7 30.0 - 36.0 g/dL   RDW 86.3 88.4 - 84.4 %   Platelet Count 228 150 - 400 K/uL   nRBC 0.0 0.0 - 0.2 %   Neutrophils Relative % 63 %   Neutro Abs 4.3 1.7 - 7.7 K/uL   Lymphocytes Relative 29 %   Lymphs Abs 2.0 0.7 - 4.0 K/uL    Monocytes Relative 6 %   Monocytes Absolute 0.4 0.1 - 1.0 K/uL   Eosinophils Relative 1 %   Eosinophils Absolute 0.1 0.0 - 0.5 K/uL   Basophils Relative 0 %   Basophils Absolute 0.0 0.0 - 0.1 K/uL   Immature Granulocytes 1 %   Abs Immature Granulocytes 0.04 0.00 - 0.07 K/uL    Comment: Performed at [facility], [address], [place], [place] [address]     RADIOGRAPHIC STUDIES:  No recent pertinent imaging studies available to review.  Orders Placed This Encounter  Procedures   CBC with Differential (Cancer Center Only)    Standing Status:   Future    Expected Date:   04/21/2024    Expiration Date:   07/20/2024   CMP (Cancer Center only)    Standing Status:   Future    Expected Date:   04/21/2024    Expiration Date:   07/20/2024   Reticulocytes    Standing Status:   Future    Expected Date:   04/21/2024    Expiration Date:   07/20/2024     Future Appointments  Date Time Provider Department Center  02/04/2024  1:10 PM [facility] DIAG TOMO 2 GI-BCGMM GI-BREAST CE  02/04/2024  1:20 PM [facility] US  2 GI-BCGUS GI-BREAST CE  03/24/2024  3:30 PM [facility]-VASC 8 H[facility]-ULTRA H&V  04/21/2024  2:00 PM [facility]-MEDONC PHLEBOTOMIST [facility] None  04/21/2024  2:30 PM Bertine Schlottman, MD [facility] None    This document was completed utilizing speech recognition software. Grammatical errors, random word insertions, pronoun errors, and incomplete sentences are  an occasional consequence of this system due to software limitations, ambient noise, and hardware issues. Any formal questions or concerns about the content, text or information contained within the body of this dictation should be directly addressed to the provider for clarification.

## 2024-01-28 NOTE — Assessment & Plan Note (Signed)
 Macrocytosis with fluctuating hemoglobin levels. Hemoglobin was normal at 12.3 g/dL during the last check, but previously low at 10.8 g/dL. Red blood cell size is enlarged, possibly due to medication effects, improper B12 utilization, or high cell turnover. Iron levels were normal, and B12 was elevated. Iron supplementation is not beneficial due to normal iron levels and experienced side effects.  On her consultation with us  on 01/20/2024, labs showed normal hemoglobin of 12.8.  MCV remains slightly elevated at 102.6.  White count and platelet count were within normal limits.  CMP unremarkable.  Vitamin B12, folate are within normal limits.  Iron studies normal.  Methylmalonic acid was also normal.  - It is likely that mild macrocytosis is from high cell turnover.  Clinical picture not concerning for MDS or other bone marrow disorders at this time.  Continue to monitor for now.  RTC in November as scheduled.

## 2024-02-02 ENCOUNTER — Other Ambulatory Visit: Payer: Self-pay | Admitting: Cardiovascular Disease

## 2024-02-02 DIAGNOSIS — I480 Paroxysmal atrial fibrillation: Secondary | ICD-10-CM

## 2024-02-03 NOTE — Telephone Encounter (Signed)
 Prescription refill request for Eliquis  received. Indication:pad Last office visit:7/25 Scr:1.06  8/25 Age: 78 Weight:53.9  kg  Under review

## 2024-02-03 NOTE — Telephone Encounter (Signed)
 Yes, she just celebrated her 78th birthday, so definitely needs to drop the dose.

## 2024-02-04 ENCOUNTER — Ambulatory Visit
Admission: RE | Admit: 2024-02-04 | Discharge: 2024-02-04 | Disposition: A | Discharge status: Home / Self Care | Source: Ambulatory Visit | Attending: Obstetrics and Gynecology | Admitting: Obstetrics and Gynecology

## 2024-02-04 DIAGNOSIS — N644 Mastodynia: Secondary | ICD-10-CM

## 2024-02-04 DIAGNOSIS — R928 Other abnormal and inconclusive findings on diagnostic imaging of breast: Secondary | ICD-10-CM | POA: Diagnosis not present

## 2024-02-19 ENCOUNTER — Telehealth: Payer: Self-pay | Admitting: Family Medicine

## 2024-02-19 ENCOUNTER — Other Ambulatory Visit: Payer: Self-pay

## 2024-02-19 DIAGNOSIS — I2510 Atherosclerotic heart disease of native coronary artery without angina pectoris: Secondary | ICD-10-CM

## 2024-02-19 MED ORDER — ATORVASTATIN CALCIUM 40 MG PO TABS: 40.0000 mg | ORAL_TABLET | Freq: Every day | ORAL | 1 refills | Status: AC

## 2024-02-19 NOTE — Telephone Encounter (Signed)
 Refilled medication.

## 2024-02-19 NOTE — Telephone Encounter (Signed)
 Prescription Request  02/19/2024  LOV: 10/21/2023  What is the name of the medication or equipment?   atorvastatin  (LIPITOR) 40 MG tablet  **90 day script requested**  Have you contacted your pharmacy to request a refill? Yes   Which pharmacy would you like this sent to?  [id number] [address], [place] - [address] [place] [address] Phone: [phone] Fax: [phone]    Patient notified that their request is being sent to the clinical staff for review and that they should receive a response within 2 business days.   Please advise pharmacist.

## 2024-02-19 NOTE — Telephone Encounter (Signed)
 Refill requested

## 2024-03-18 ENCOUNTER — Encounter: Payer: Self-pay | Admitting: Oncology

## 2024-03-18 DIAGNOSIS — R1084 Generalized abdominal pain: Secondary | ICD-10-CM | POA: Diagnosis not present

## 2024-03-19 ENCOUNTER — Telehealth: Payer: Self-pay

## 2024-03-19 ENCOUNTER — Other Ambulatory Visit: Payer: Self-pay | Admitting: Oncology

## 2024-03-19 DIAGNOSIS — D7589 Other specified diseases of blood and blood-forming organs: Secondary | ICD-10-CM

## 2024-03-19 NOTE — Telephone Encounter (Signed)
 Patient called to discuss the following concerns:  Established pain that started last May, 2025 to the right breast bone, right rib cage, abdominal RUQ. New pain below right shoulder blade of backside. New: sweating under arms. Increased fatigue and sleepiness. Worsening of SOB with increase in frequency. Patient denies having any recent falls. Resides with husband.   Patient was encouraged to go to the Emergency Department due to possible symptomology of possible MI. Patient denied this recommendation for now but agreed to go if her symptoms worsened. Patient's appointment with Dr. Pasam was changed to a sooner appt for next week. Patient seemed to understand all concerns and instructions and agreed.

## 2024-03-19 NOTE — Telephone Encounter (Signed)
 Followed up on patient's message regarding some increased pain and request to be called. Left a voicemail. Scheduler reported that she did move patient's Oncology appointment to a sooner date for next week. Provided contact information to allow patient to return my call to discuss her concerns around her pain.

## 2024-03-20 ENCOUNTER — Telehealth: Payer: Self-pay

## 2024-03-20 NOTE — Telephone Encounter (Signed)
 Spoke with patient via phone call today and let her know that Dr. Autumn is aware of all her symptoms and is okay to wait for her Oct 13th's oncology appointment. Patient was in agreement with this plan.   It was also emphasized that patient should go to the ED if her symptoms worsen. Patient agreed.

## 2024-03-23 ENCOUNTER — Inpatient Hospital Stay (HOSPITAL_BASED_OUTPATIENT_CLINIC_OR_DEPARTMENT_OTHER): Admitting: Oncology

## 2024-03-23 ENCOUNTER — Inpatient Hospital Stay: Admitting: Oncology

## 2024-03-23 ENCOUNTER — Other Ambulatory Visit: Payer: Self-pay | Admitting: Oncology

## 2024-03-23 ENCOUNTER — Inpatient Hospital Stay

## 2024-03-23 ENCOUNTER — Encounter: Payer: Self-pay | Admitting: Oncology

## 2024-03-23 ENCOUNTER — Inpatient Hospital Stay: Attending: Oncology

## 2024-03-23 VITALS — BP 158/52 | HR 55 | Temp 98.0°F | Resp 18 | Ht 59.0 in | Wt 119.1 lb

## 2024-03-23 DIAGNOSIS — Z853 Personal history of malignant neoplasm of breast: Secondary | ICD-10-CM

## 2024-03-23 DIAGNOSIS — I493 Ventricular premature depolarization: Secondary | ICD-10-CM | POA: Diagnosis not present

## 2024-03-23 DIAGNOSIS — T454X5A Adverse effect of iron and its compounds, initial encounter: Secondary | ICD-10-CM | POA: Diagnosis not present

## 2024-03-23 DIAGNOSIS — L089 Local infection of the skin and subcutaneous tissue, unspecified: Secondary | ICD-10-CM | POA: Diagnosis not present

## 2024-03-23 DIAGNOSIS — R5383 Other fatigue: Secondary | ICD-10-CM | POA: Diagnosis not present

## 2024-03-23 DIAGNOSIS — N183 Chronic kidney disease, stage 3 unspecified: Secondary | ICD-10-CM | POA: Diagnosis not present

## 2024-03-23 DIAGNOSIS — K5903 Drug induced constipation: Secondary | ICD-10-CM | POA: Insufficient documentation

## 2024-03-23 DIAGNOSIS — R110 Nausea: Secondary | ICD-10-CM | POA: Insufficient documentation

## 2024-03-23 DIAGNOSIS — E785 Hyperlipidemia, unspecified: Secondary | ICD-10-CM | POA: Insufficient documentation

## 2024-03-23 DIAGNOSIS — Z880 Allergy status to penicillin: Secondary | ICD-10-CM | POA: Insufficient documentation

## 2024-03-23 DIAGNOSIS — Z882 Allergy status to sulfonamides status: Secondary | ICD-10-CM | POA: Insufficient documentation

## 2024-03-23 DIAGNOSIS — N644 Mastodynia: Secondary | ICD-10-CM | POA: Insufficient documentation

## 2024-03-23 DIAGNOSIS — M810 Age-related osteoporosis without current pathological fracture: Secondary | ICD-10-CM | POA: Insufficient documentation

## 2024-03-23 DIAGNOSIS — D7589 Other specified diseases of blood and blood-forming organs: Secondary | ICD-10-CM | POA: Insufficient documentation

## 2024-03-23 DIAGNOSIS — Z881 Allergy status to other antibiotic agents status: Secondary | ICD-10-CM | POA: Insufficient documentation

## 2024-03-23 DIAGNOSIS — Z7901 Long term (current) use of anticoagulants: Secondary | ICD-10-CM | POA: Insufficient documentation

## 2024-03-23 DIAGNOSIS — I2510 Atherosclerotic heart disease of native coronary artery without angina pectoris: Secondary | ICD-10-CM | POA: Insufficient documentation

## 2024-03-23 DIAGNOSIS — I4891 Unspecified atrial fibrillation: Secondary | ICD-10-CM | POA: Diagnosis not present

## 2024-03-23 DIAGNOSIS — R630 Anorexia: Secondary | ICD-10-CM | POA: Diagnosis not present

## 2024-03-23 DIAGNOSIS — I129 Hypertensive chronic kidney disease with stage 1 through stage 4 chronic kidney disease, or unspecified chronic kidney disease: Secondary | ICD-10-CM | POA: Insufficient documentation

## 2024-03-23 DIAGNOSIS — M7989 Other specified soft tissue disorders: Secondary | ICD-10-CM | POA: Diagnosis not present

## 2024-03-23 DIAGNOSIS — R0789 Other chest pain: Secondary | ICD-10-CM | POA: Insufficient documentation

## 2024-03-23 DIAGNOSIS — Z886 Allergy status to analgesic agent status: Secondary | ICD-10-CM | POA: Insufficient documentation

## 2024-03-23 DIAGNOSIS — R0781 Pleurodynia: Secondary | ICD-10-CM | POA: Insufficient documentation

## 2024-03-23 DIAGNOSIS — D649 Anemia, unspecified: Secondary | ICD-10-CM | POA: Diagnosis not present

## 2024-03-23 DIAGNOSIS — Z79899 Other long term (current) drug therapy: Secondary | ICD-10-CM | POA: Insufficient documentation

## 2024-03-23 DIAGNOSIS — Z91040 Latex allergy status: Secondary | ICD-10-CM | POA: Insufficient documentation

## 2024-03-23 DIAGNOSIS — R1010 Upper abdominal pain, unspecified: Secondary | ICD-10-CM | POA: Insufficient documentation

## 2024-03-23 LAB — CBC WITH DIFFERENTIAL (CANCER CENTER ONLY)
Abs Immature Granulocytes: 0.07 K/uL (ref 0.00–0.07)
Basophils Absolute: 0 K/uL (ref 0.0–0.1)
Basophils Relative: 0 %
Eosinophils Absolute: 0 K/uL (ref 0.0–0.5)
Eosinophils Relative: 1 %
HCT: 36.9 % (ref 36.0–46.0)
Hemoglobin: 12.2 g/dL (ref 12.0–15.0)
Immature Granulocytes: 1 %
Lymphocytes Relative: 27 %
Lymphs Abs: 2 K/uL (ref 0.7–4.0)
MCH: 33.2 pg (ref 26.0–34.0)
MCHC: 33.1 g/dL (ref 30.0–36.0)
MCV: 100.3 fL — ABNORMAL HIGH (ref 80.0–100.0)
Monocytes Absolute: 0.4 K/uL (ref 0.1–1.0)
Monocytes Relative: 6 %
Neutro Abs: 4.8 K/uL (ref 1.7–7.7)
Neutrophils Relative %: 65 %
Platelet Count: 218 K/uL (ref 150–400)
RBC: 3.68 MIL/uL — ABNORMAL LOW (ref 3.87–5.11)
RDW: 13.1 % (ref 11.5–15.5)
WBC Count: 7.3 K/uL (ref 4.0–10.5)
nRBC: 0 % (ref 0.0–0.2)

## 2024-03-23 LAB — CMP (CANCER CENTER ONLY)
ALT: 9 U/L (ref 0–44)
AST: 18 U/L (ref 15–41)
Albumin: 3.9 g/dL (ref 3.5–5.0)
Alkaline Phosphatase: 46 U/L (ref 38–126)
Anion gap: 10 (ref 5–15)
BUN: 29 mg/dL — ABNORMAL HIGH (ref 8–23)
CO2: 25 mmol/L (ref 22–32)
Calcium: 9.6 mg/dL (ref 8.9–10.3)
Chloride: 104 mmol/L (ref 98–111)
Creatinine: 1.02 mg/dL — ABNORMAL HIGH (ref 0.44–1.00)
GFR, Estimated: 55 mL/min — ABNORMAL LOW (ref >60)
Glucose, Bld: 91 mg/dL (ref 70–99)
Potassium: 4.6 mmol/L (ref 3.5–5.1)
Sodium: 139 mmol/L (ref 135–145)
Total Bilirubin: 0.6 mg/dL (ref 0.0–1.2)
Total Protein: 6.5 g/dL (ref 6.5–8.1)

## 2024-03-23 LAB — RETICULOCYTES
Immature Retic Fract: 5.3 % (ref 2.3–15.9)
RBC.: 3.63 MIL/uL — ABNORMAL LOW (ref 3.87–5.11)
Retic Count, Absolute: 43.9 K/uL (ref 19.0–186.0)
Retic Ct Pct: 1.2 % (ref 0.4–3.1)

## 2024-03-23 NOTE — Progress Notes (Signed)
 [facility]  HEMATOLOGY CLINIC PROGRESS NOTE  PATIENT NAME: Brittany Kirk   MR#: [id number] DOB: 29-May-1946  Patient Care Team: Duanne Butler DASEN, MD as PCP - General (Family Medicine) Court Dorn PARAS, MD as PCP - Cardiology (Cardiology) Inocencio Soyla Lunger, MD as PCP - Electrophysiology (Cardiology) Keenan Hastings, MD as Consulting Physician (Radiation Oncology) Mat Browning, MD as Consulting Physician (Obstetrics and Gynecology) Rosalie Kitchens, MD as Consulting Physician (Gastroenterology) Nicholaus Sherlean CROME, [facility] (Inactive) as Pharmacist (Pharmacist) Cleotilde Elspeth CROME, OD (Optometry)  Date of visit: 03/23/2024   ASSESSMENT & PLAN:   Brittany Kirk is a 78 y.o. lady with a past medical history of CAD, CKD stage III, remote history of right sided breast cancer in 2015, GERD, hypertension, dyslipidemia, osteoporosis, was referred to our service in August 2025 for evaluation of RBC macrocytosis.     Macrocytosis Macrocytosis with fluctuating hemoglobin levels. Hemoglobin was normal at 12.3 g/dL during the last check, but previously low at 10.8 g/dL. Red blood cell size is enlarged, possibly due to medication effects, improper B12 utilization, or high cell turnover. Iron levels were normal, and B12 was elevated. Iron supplementation is not beneficial due to normal iron levels and experienced side effects.  On her consultation with us  on 01/20/2024, labs showed normal hemoglobin of 12.8.  MCV remains slightly elevated at 102.6.  White count and platelet count were within normal limits.  CMP unremarkable.  Vitamin B12, folate are within normal limits.  Iron studies normal.  Methylmalonic acid was also normal.  Labs today showed almost normal MCV of 100.3.  Hemoglobin stable at 12.2.  No additional workup or intervention is needed at this time for this.  - It is likely that mild macrocytosis is from high cell turnover.  Clinical picture not concerning for MDS or  other bone marrow disorders at this time.  Continue to monitor for now.  RTC in November as scheduled.  Right breast cancer, status post lumpectomy and right axillary lymph node dissection Status post lumpectomy and right axillary lymph node dissection with 18 lymph nodes removed. Recent mammogram and ultrasound in August 2025 were clear. No palpable lymphadenopathy under the arm. Reports intermittent sweating under the right arm, a new symptom.  Right-sided chest wall and upper abdominal pain Persistent pain under the right breast and in the right upper abdomen, exacerbated by pressure. No associated nausea, vomiting, or changes with food intake. Previous mammogram and ultrasound were clear. Prior abdominal scan in February showed no major concerns. Further investigation is needed. - Order CT scan of the chest without contrast to evaluate the cause of the pain. - Ensure the scheduling department contacts her to set up the CT scan.  I will plan to see her as scheduled in November 2025 and discuss CT results.  I spent a total of 30 minutes during this encounter with the patient including review of chart and various tests results, discussions about plan of care and coordination of care plan.  I reviewed lab results and outside records for this visit and discussed relevant results with the patient. Diagnosis, plan of care and treatment options were also discussed in detail with the patient. Opportunity provided to ask questions and answers provided to her apparent satisfaction. Provided instructions to call our clinic with any problems, questions or concerns prior to return visit. I recommended to continue follow-up with PCP and sub-specialists. She verbalized understanding and agreed with the plan. No barriers to learning was detected.  Caron Tardif,  MD  03/23/2024 2:58 PM  [facility] - A DEPT OF [facility] [address] [place] [address] Dept: [phone] Dept Fax: [phone]   CHIEF COMPLAINT/ REASON FOR VISIT:  Follow-up for chronic, mild RBC macrocytosis.  Grossly negative hematological workup.  Likely from increased cell turnover.  INTERVAL HISTORY:  Discussed the use of AI scribe software for clinical note transcription with the patient, who gave verbal consent to proceed.  History of Present Illness Brittany Kirk is an 78 year old female with breast cancer who presents with right-sided rib and abdominal pain.  She has been experiencing pain under her right breast for about two weeks, exacerbated by pressure from her bra and when lying on her right side. The pain extends to her right abdomen and is associated with her rib cage. There is no change in pain with eating, and it is not accompanied by acid reflux or heartburn.  She has a history of breast cancer and underwent a lumpectomy with removal of 18 lymph nodes on the right side. She notes occasional sweating under her right arm, which she has not experienced in over 20 years. She denies any recent trauma or injury to the area.  Her bowel movements occur every couple of days and are described as normal. She experiences nausea but no vomiting, and she forces herself to eat despite a lack of appetite. She reports feeling tired all the time and experiences increased thirst.  She is currently taking vitamin B12. She denies any urinary symptoms such as burning, blood in urine, or pain during urination.  She mentions a bacterial skin infection diagnosed by her dermatologist, for which she is applying an ointment. There is no rash present in the areas of pain.   SUMMARY OF HEMATOLOGIC HISTORY:  She was referred by her primary care physician for evaluation of abnormal red blood cell size.   She reports that her primary care physician told her she has chronic anemia, and that her last two blood tests showed slightly abnormal red  blood cell size. Despite this, her B12 levels are high and iron levels are normal. She experiences side effects such as headaches, nausea, and constipation from iron supplements.   She experiences rib pain and has a history of breast cancer. She reports fatigue and a significant decrease in energy, impacting her ability to maintain her household. 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 She recently stopped taking prescription Prilosec as she no longer experiences acid reflux.   Her weight has been stable around 116-118 pounds, although she has been trying to eat more to maintain her weight. No alcohol consumption.   On her consultation with us  on 01/20/2024, labs showed normal hemoglobin of 12.8.  MCV remains slightly elevated at 102.6.  White count and platelet count were within normal limits.  CMP unremarkable.  Vitamin B12, folate are within normal limits.  Iron studies normal.  Methylmalonic acid was also normal.   - It is likely that mild macrocytosis is from high cell turnover.  Clinical picture not concerning for MDS or other bone marrow disorders at this time.   Continue to monitor for now.  I have reviewed the past medical history, past surgical history, social history and  family history with the patient and they are unchanged from previous note.  ALLERGIES: She is allergic to metronidazole, penicillins, fosamax  [alendronate ], levofloxacin , mexiletine, trimethoprim, cefaclor, latex, naproxen, nsaids, septra [bactrim], sulfonamide derivatives, tape, and wound dressing adhesive.  MEDICATIONS:  Current Outpatient Medications  Medication Sig Dispense Refill   acetaminophen  (TYLENOL ) 500 MG tablet Take 1,000 mg by mouth every 6 (six) hours as needed for moderate pain or headache.      apixaban  (ELIQUIS ) 2.5 MG TABS tablet Take 1 tablet (2.5 mg total) by mouth 2 (two) times daily. 180 tablet 1   ascorbic acid  (VITAMIN C) 1000 MG tablet Take 1,000 mg by mouth daily.     atorvastatin  (LIPITOR) 40 MG tablet Take 1 tablet (40 mg total) by mouth daily. 90 tablet 1   cholecalciferol  (VITAMIN D3) 25 MCG (1000 UNIT) tablet Take 1,000 Units by mouth daily.     diazepam  (VALIUM ) 5 MG tablet Take 5 mg by mouth as needed for sedation.     mupirocin  ointment (BACTROBAN ) 2 % Apply 1 Application topically 3 (three) times daily.     omeprazole (PRILOSEC) 20 MG capsule Take 20 mg by mouth daily.     valsartan  (DIOVAN ) 40 MG tablet Take 1 tablet (40 mg total) by mouth daily. Hold for Blood Pressure <120 (Patient taking differently: Take 40 mg by mouth daily. Hold for Blood Pressure <120) 90 tablet 3   Folic Acid -B12-B6-Arginine 0.067-0.017-3.3 1000 MG TABS Take 1,000 mg by mouth daily. (Patient taking differently: Take 1,000 mg by mouth daily. Patient seems confused about this medication. Did not realize it has B-12 and B-6 combined.)     No current facility-administered medications for this visit.     REVIEW OF SYSTEMS:    Review of Systems - Oncology  All other pertinent systems were reviewed with the patient and are negative.  PHYSICAL EXAMINATION:   Onc Performance Status - 03/23/24 1357       ECOG Perf Status   ECOG Perf Status Ambulatory and capable of all selfcare but unable to carry out any work activities.  Up and about more than 50% of waking hours      KPS SCALE   KPS % SCORE Cares for self, unable to carry on normal activity or to do active work          Vitals:   03/23/24 1341  BP: (!) 158/52  Pulse: (!) 55  Resp: 18  Temp: 98 F (36.7 C)  SpO2: 100%   Filed Weights   03/23/24 1341  Weight: 119 lb 1.6 oz (54 kg)    Physical Exam Constitutional:      General: She is not in acute distress.    Appearance: Normal appearance.  HENT:     Head:  Normocephalic and atraumatic.  Cardiovascular:     Rate and Rhythm: Normal rate.  Pulmonary:     Effort: Pulmonary effort is normal. No respiratory distress.  Chest:     Comments: Mild tenderness noted on the anterior lower rib cage on the right side.  No axillary lymphadenopathy palpable. Abdominal:     General: There is no distension.  Neurological:     General: No focal deficit present.     Mental Status: She is alert and oriented to person, place, and time.  Psychiatric:        Mood and Affect: Mood normal.        Behavior: Behavior normal.      LABORATORY DATA:   I have reviewed  the data as listed.  Results for orders placed or performed in visit on 03/23/24  Reticulocytes  Result Value Ref Range   Retic Ct Pct 1.2 0.4 - 3.1 %   RBC. 3.63 (L) 3.87 - 5.11 MIL/uL   Retic Count, Absolute 43.9 19.0 - 186.0 K/uL   Immature Retic Fract 5.3 2.3 - 15.9 %  CBC with Differential (Cancer Center Only)  Result Value Ref Range   WBC Count 7.3 4.0 - 10.5 K/uL   RBC 3.68 (L) 3.87 - 5.11 MIL/uL   Hemoglobin 12.2 12.0 - 15.0 g/dL   HCT 63.0 63.9 - 53.9 %   MCV 100.3 (H) 80.0 - 100.0 fL   MCH 33.2 26.0 - 34.0 pg   MCHC 33.1 30.0 - 36.0 g/dL   RDW 86.8 88.4 - 84.4 %   Platelet Count 218 150 - 400 K/uL   nRBC 0.0 0.0 - 0.2 %   Neutrophils Relative % 65 %   Neutro Abs 4.8 1.7 - 7.7 K/uL   Lymphocytes Relative 27 %   Lymphs Abs 2.0 0.7 - 4.0 K/uL   Monocytes Relative 6 %   Monocytes Absolute 0.4 0.1 - 1.0 K/uL   Eosinophils Relative 1 %   Eosinophils Absolute 0.0 0.0 - 0.5 K/uL   Basophils Relative 0 %   Basophils Absolute 0.0 0.0 - 0.1 K/uL   Immature Granulocytes 1 %   Abs Immature Granulocytes 0.07 0.00 - 0.07 K/uL  CMP (Cancer Center only)  Result Value Ref Range   Sodium 139 135 - 145 mmol/L   Potassium 4.6 3.5 - 5.1 mmol/L   Chloride 104 98 - 111 mmol/L   CO2 25 22 - 32 mmol/L   Glucose, Bld 91 70 - 99 mg/dL   BUN 29 (H) 8 - 23 mg/dL   Creatinine 8.97 (H) 9.55 -  1.00 mg/dL   Calcium  9.6 8.9 - 10.3 mg/dL   Total Protein 6.5 6.5 - 8.1 g/dL   Albumin 3.9 3.5 - 5.0 g/dL   AST 18 15 - 41 U/L   ALT 9 0 - 44 U/L   Alkaline Phosphatase 46 38 - 126 U/L   Total Bilirubin 0.6 0.0 - 1.2 mg/dL   GFR, Estimated 55 (L) >60 mL/min   Anion gap 10 5 - 15     RADIOGRAPHIC STUDIES:  I have personally reviewed the radiological images as listed and agree with the findings in the report.  US  LIMITED ULTRASOUND INCLUDING AXILLA RIGHT BREAST CLINICAL DATA:  78 year old female presents for palpable lump in the RIGHT breast and pain in the RIGHT axilla. Also for annual bilateral mammogram.  EXAM: DIGITAL DIAGNOSTIC BILATERAL MAMMOGRAM WITH TOMOSYNTHESIS AND CAD; ULTRASOUND RIGHT BREAST LIMITED  TECHNIQUE: Bilateral digital diagnostic mammography and breast tomosynthesis was performed. The images were evaluated with computer-aided detection. ; Targeted ultrasound examination of the right breast was performed  COMPARISON:  Previous exam(s).  ACR Breast Density Category b: There are scattered areas of fibroglandular density.  FINDINGS: Full field views of both breasts and spot compression views of the RIGHT breast demonstrate RIGHT breast surgical changes.  No new or suspicious mammographic findings within either breast identified.  Targeted ultrasound is performed, showing fat necrosis changes at the 10 o'clock position of the RIGHT breast 1 cm from the nipple, corresponding to the patient's palpable abnormality.  No abnormalities in the RIGHT axilla are noted. No abnormal RIGHT axillary lymph nodes are present.  IMPRESSION: 1. Benign fat necrosis changes within the UPPER-OUTER RIGHT breast  corresponding to the patient's palpable abnormality. 2. No mammographic or sonographic abnormalities within the RIGHT axilla. 3. No new or suspicious mammographic findings within either breast.  RECOMMENDATION: Bilateral screening mammogram in 1 year.  I  have discussed the findings and recommendations with the patient. If applicable, a reminder letter will be sent to the patient regarding the next appointment.  BI-RADS CATEGORY  2: Benign.  Electronically Signed   By: Reyes Phi M.D.   On: 02/04/2024 13:57 MM 3D DIAGNOSTIC MAMMOGRAM BILATERAL BREAST CLINICAL DATA:  78 year old female presents for palpable lump in the RIGHT breast and pain in the RIGHT axilla. Also for annual bilateral mammogram.  EXAM: DIGITAL DIAGNOSTIC BILATERAL MAMMOGRAM WITH TOMOSYNTHESIS AND CAD; ULTRASOUND RIGHT BREAST LIMITED  TECHNIQUE: Bilateral digital diagnostic mammography and breast tomosynthesis was performed. The images were evaluated with computer-aided detection. ; Targeted ultrasound examination of the right breast was performed  COMPARISON:  Previous exam(s).  ACR Breast Density Category b: There are scattered areas of fibroglandular density.  FINDINGS: Full field views of both breasts and spot compression views of the RIGHT breast demonstrate RIGHT breast surgical changes.  No new or suspicious mammographic findings within either breast identified.  Targeted ultrasound is performed, showing fat necrosis changes at the 10 o'clock position of the RIGHT breast 1 cm from the nipple, corresponding to the patient's palpable abnormality.  No abnormalities in the RIGHT axilla are noted. No abnormal RIGHT axillary lymph nodes are present.  IMPRESSION: 1. Benign fat necrosis changes within the UPPER-OUTER RIGHT breast corresponding to the patient's palpable abnormality. 2. No mammographic or sonographic abnormalities within the RIGHT axilla. 3. No new or suspicious mammographic findings within either breast.  RECOMMENDATION: Bilateral screening mammogram in 1 year.  I have discussed the findings and recommendations with the patient. If applicable, a reminder letter will be sent to the patient regarding the next appointment.  BI-RADS  CATEGORY  2: Benign.  Electronically Signed   By: Reyes Phi M.D.   On: 02/04/2024 13:57    Orders Placed This Encounter  Procedures   CT Chest Wo Contrast    Standing Status:   Future    Expected Date:   03/30/2024    Expiration Date:   03/23/2025    Preferred imaging location?:   [facility]     Future Appointments  Date Time Provider Department Center  03/24/2024  3:30 PM [facility] 8 [facility] H&V  04/21/2024  2:00 PM [facility] PHLEBOTOMIST [facility] None  04/21/2024  2:30 PM Beckam Abdulaziz, MD [facility] None  06/23/2024  1:30 PM Leroux-Martinez, Rosaline Jansky, AUD [facility] None  06/23/2024  2:00 PM Tobie Eldora NOVAK, MD [facility] None     This document was completed utilizing speech recognition software. Grammatical errors, random word insertions, pronoun errors, and incomplete sentences are an occasional consequence of this system due to software limitations, ambient noise, and hardware issues. Any formal questions or concerns about the content, text or information contained within the body of this dictation should be directly addressed to the provider for clarification.

## 2024-03-23 NOTE — Progress Notes (Deleted)
 [facility]  HEMATOLOGY CLINIC PROGRESS NOTE  PATIENT NAME: Brittany Kirk   MR#: [id number] DOB: 1945-11-14  Patient Care Team: Duanne Butler DASEN, MD as PCP - General (Family Medicine) Court Dorn PARAS, MD as PCP - Cardiology (Cardiology) Inocencio Soyla Lunger, MD as PCP - Electrophysiology (Cardiology) Keenan Hastings, MD as Consulting Physician (Radiation Oncology) Mat Browning, MD as Consulting Physician (Obstetrics and Gynecology) Rosalie Kitchens, MD as Consulting Physician (Gastroenterology) Nicholaus Sherlean CROME, [facility] (Inactive) as Pharmacist (Pharmacist) Cleotilde Elspeth CROME, OD (Optometry)  Date of visit: 03/23/2024   ASSESSMENT & PLAN:   Brittany Kirk is a 78 y.o. lady with a past medical history of CAD, CKD stage III, remote history of right sided breast cancer in 2015, GERD, hypertension, dyslipidemia, osteoporosis, was referred to our service in August 2025 for evaluation of RBC macrocytosis.     No problem-specific Assessment & Plan notes found for this encounter.   Assessment and Plan Assessment & Plan       I spent a total of {CHL ONC TIME VISIT - DTPQU:[id number]} during this encounter with the patient including review of chart and various tests results, discussions about plan of care and coordination of care plan.  I reviewed lab results and outside records for this visit and discussed relevant results with the patient. Diagnosis, plan of care and treatment options were also discussed in detail with the patient. Opportunity provided to ask questions and answers provided to her apparent satisfaction. Provided instructions to call our clinic with any problems, questions or concerns prior to return visit. I recommended to continue follow-up with PCP and sub-specialists. She verbalized understanding and agreed with the plan. No barriers to learning was detected.  Brittany Patten, MD  03/23/2024 8:29 AM  [facility]  [place] [facility] DE[facility] OF [facility] [address] [place] [address] Dept: [phone] Dept Fax: [phone]   CHIEF COMPLAINT/ REASON FOR VISIT:  ***  INTERVAL HISTORY:  Discussed the use of AI scribe software for clinical note transcription with the patient, who gave verbal consent to proceed.  History of Present Illness     ***  SUMMARY OF HEMATOLOGIC HISTORY:  She was referred by her primary care physician for evaluation of abnormal red blood cell size.   She reports that her primary care physician told her she has chronic anemia, and that her last two blood tests showed slightly abnormal red blood cell size. Despite this, her B12 levels are high and iron levels are normal. She experiences side effects such as headaches, nausea, and constipation from iron supplements.   She experiences rib pain and has a history of breast cancer. She reports fatigue and a significant decrease in energy, impacting her ability to maintain her household. 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 She recently stopped taking prescription Prilosec as she no longer experiences acid reflux.   Her weight has been stable around 116-118 pounds, although she has been trying to eat more to maintain her weight. No alcohol consumption.   On her consultation with us  on 01/20/2024, labs showed normal hemoglobin of 12.8.  MCV remains slightly elevated at 102.6.  White count and platelet count were within normal limits.  CMP unremarkable.  Vitamin B12, folate  are within normal limits.  Iron studies normal.  Methylmalonic acid was also normal.   - It is likely that mild macrocytosis is from high cell turnover.  Clinical picture not concerning for MDS or other bone marrow  disorders at this time.   Continue to monitor for now.  I have reviewed the past medical history, past surgical history, social history and family history with the patient and they are unchanged from previous note.  ALLERGIES: She is allergic to metronidazole, penicillins, fosamax  [alendronate ], levofloxacin , mexiletine, trimethoprim, cefaclor, latex, naproxen, nsaids, septra [bactrim], sulfonamide derivatives, tape, and wound dressing adhesive.  MEDICATIONS:  Current Outpatient Medications  Medication Sig Dispense Refill   acetaminophen  (TYLENOL ) 500 MG tablet Take 1,000 mg by mouth every 6 (six) hours as needed for moderate pain or headache.     apixaban  (ELIQUIS ) 2.5 MG TABS tablet Take 1 tablet (2.5 mg total) by mouth 2 (two) times daily. 180 tablet 1   ascorbic acid  (VITAMIN C) 1000 MG tablet Take 1,000 mg by mouth daily.     atorvastatin  (LIPITOR) 40 MG tablet Take 1 tablet (40 mg total) by mouth daily. 90 tablet 1   cholecalciferol  (VITAMIN D3) 25 MCG (1000 UNIT) tablet Take 1,000 Units by mouth daily. (Patient taking differently: Take 1,000 Units by mouth daily. Patient reports that she takes every other day.)     diazepam  (VALIUM ) 5 MG tablet Take 5 mg by mouth as needed for sedation.     ferrous sulfate 325 (65 FE) MG tablet Take 325 mg by mouth daily after lunch. (Patient taking differently: Take 325 mg by mouth daily after lunch. Takes 1 every 3 days due to side effects)     Folic Acid -B12-B6-Arginine 0.067-0.017-3.3 1000 MG TABS Take 1,000 mg by mouth daily. (Patient taking differently: Take 1,000 mg by mouth daily. Patient seems confused about this medication. Did not realize it has B-12 and B-6 combined.)     mupirocin  ointment (BACTROBAN ) 2 % Apply 1 Application topically 3 (three) times daily.     omeprazole (PRILOSEC) 20 MG capsule Take 20 mg by mouth daily.     valsartan  (DIOVAN ) 40 MG tablet Take 1 tablet (40 mg total) by mouth daily. Hold for Blood Pressure <120 (Patient  taking differently: Take 40 mg by mouth daily. Hold for Blood Pressure <120) 90 tablet 3   No current facility-administered medications for this visit.     REVIEW OF SYSTEMS:    Review of Systems - Oncology  All other pertinent systems were reviewed with the patient and are negative.  PHYSICAL EXAMINATION:  ***   There were no vitals filed for this visit. There were no vitals filed for this visit.  Physical Exam ***  LABORATORY DATA:   I have reviewed the data as listed.  No results found for any visits on 03/23/24.  Lab Results  Component Value Date   WBC 6.8 01/20/2024   NEUTROABS 4.3 01/20/2024   HGB 12.8 01/20/2024   HCT 39.2 01/20/2024   MCV 102.6 (H) 01/20/2024   PLT 228 01/20/2024       Component Value Date/Time   NA 141 01/20/2024 1419   NA 141 05/17/2022 1217   NA 141 02/25/2017 1415   K 4.2 01/20/2024 1419   K 3.9 02/25/2017 1415   CL 105 01/20/2024 1419   CO2 25 01/20/2024 1419   CO2 26 02/25/2017 1415   GLUCOSE 98 01/20/2024 1419   GLUCOSE 110 02/25/2017 1415   BUN 21 01/20/2024 1419  BUN 27 05/17/2022 1217   BUN 14.1 02/25/2017 1415   CREATININE 1.06 (H) 01/20/2024 1419   CREATININE 0.92 06/25/2023 1546   CREATININE 1.0 02/25/2017 1415   CALCIUM  9.8 01/20/2024 1419   CALCIUM  9.2 02/25/2017 1415   PROT 7.0 01/20/2024 1419   PROT 5.9 (L) 05/17/2022 1217   PROT 6.5 02/25/2017 1415   ALBUMIN 4.2 01/20/2024 1419   ALBUMIN 3.9 05/17/2022 1217   ALBUMIN 3.5 02/25/2017 1415   AST 19 01/20/2024 1419   AST 16 02/25/2017 1415   ALT 11 01/20/2024 1419   ALT 10 02/25/2017 1415   ALKPHOS 54 01/20/2024 1419   ALKPHOS 74 02/25/2017 1415   BILITOT 0.7 01/20/2024 1419   BILITOT 0.56 02/25/2017 1415   GFRNONAA 53 (L) 01/20/2024 1419   GFRNONAA 68 09/29/2019 1244   GFRAA >60 03/12/2020 1036   GFRAA 79 09/29/2019 1244    No results found for: SPEP, UPEP    Chemistry      Component Value Date/Time   NA 141 01/20/2024 1419   NA 141  05/17/2022 1217   NA 141 02/25/2017 1415   K 4.2 01/20/2024 1419   K 3.9 02/25/2017 1415   CL 105 01/20/2024 1419   CO2 25 01/20/2024 1419   CO2 26 02/25/2017 1415   BUN 21 01/20/2024 1419   BUN 27 05/17/2022 1217   BUN 14.1 02/25/2017 1415   CREATININE 1.06 (H) 01/20/2024 1419   CREATININE 0.92 06/25/2023 1546   CREATININE 1.0 02/25/2017 1415      Component Value Date/Time   CALCIUM  9.8 01/20/2024 1419   CALCIUM  9.2 02/25/2017 1415   ALKPHOS 54 01/20/2024 1419   ALKPHOS 74 02/25/2017 1415   AST 19 01/20/2024 1419   AST 16 02/25/2017 1415   ALT 11 01/20/2024 1419   ALT 10 02/25/2017 1415   BILITOT 0.7 01/20/2024 1419   BILITOT 0.56 02/25/2017 1415        RADIOGRAPHIC STUDIES:  I have personally reviewed the radiological images as listed and agree with the findings in the report.  No results found.  *** No recent pertinent imaging studies available to review.  No orders of the defined types were placed in this encounter.    Future Appointments  Date Time Provider Department Center  03/23/2024  2:30 PM [facility] PHLEBOTOMIST [facility] None  03/23/2024  3:00 PM Vihaan Gloss, MD [facility] None  03/24/2024  3:30 PM [facility] 8 [facility] H&V  04/21/2024  2:00 PM [facility] PHLEBOTOMIST [facility] None  04/21/2024  2:30 PM Aleiah Mohammed, MD [facility] None  06/23/2024  1:30 PM Leroux-Martinez, Rosaline Jansky, AUD [facility] None  06/23/2024  2:00 PM Tobie Eldora NOVAK, MD [facility] None     This document was completed utilizing speech recognition software. Grammatical errors, random word insertions, pronoun errors, and incomplete sentences are an occasional consequence of this system due to software limitations, ambient noise, and hardware issues. Any formal questions or concerns about the content, text or information contained within the body of this dictation should be directly addressed to the provider for clarification.

## 2024-03-23 NOTE — Assessment & Plan Note (Signed)
 Macrocytosis with fluctuating hemoglobin levels. Hemoglobin was normal at 12.3 g/dL during the last check, but previously low at 10.8 g/dL. Red blood cell size is enlarged, possibly due to medication effects, improper B12 utilization, or high cell turnover. Iron levels were normal, and B12 was elevated. Iron supplementation is not beneficial due to normal iron levels and experienced side effects.  On her consultation with us  on 01/20/2024, labs showed normal hemoglobin of 12.8.  MCV remains slightly elevated at 102.6.  White count and platelet count were within normal limits.  CMP unremarkable.  Vitamin B12, folate are within normal limits.  Iron studies normal.  Methylmalonic acid was also normal.  Labs today showed almost normal MCV of 100.3.  Hemoglobin stable at 12.2.  No additional workup or intervention is needed at this time for this.  - It is likely that mild macrocytosis is from high cell turnover.  Clinical picture not concerning for MDS or other bone marrow disorders at this time.  Continue to monitor for now.  RTC in November as scheduled.

## 2024-03-24 ENCOUNTER — Ambulatory Visit (HOSPITAL_COMMUNITY)
Admission: RE | Admit: 2024-03-24 | Discharge: 2024-03-24 | Disposition: A | Discharge status: Home / Self Care | Source: Ambulatory Visit | Attending: Cardiovascular Disease | Admitting: Cardiovascular Disease

## 2024-03-24 DIAGNOSIS — I2510 Atherosclerotic heart disease of native coronary artery without angina pectoris: Secondary | ICD-10-CM | POA: Insufficient documentation

## 2024-03-24 DIAGNOSIS — E782 Mixed hyperlipidemia: Secondary | ICD-10-CM | POA: Diagnosis not present

## 2024-03-24 DIAGNOSIS — I6523 Occlusion and stenosis of bilateral carotid arteries: Secondary | ICD-10-CM | POA: Insufficient documentation

## 2024-03-25 ENCOUNTER — Ambulatory Visit: Payer: Self-pay | Admitting: Cardiovascular Disease

## 2024-03-25 DIAGNOSIS — I6523 Occlusion and stenosis of bilateral carotid arteries: Secondary | ICD-10-CM

## 2024-04-02 ENCOUNTER — Ambulatory Visit (HOSPITAL_BASED_OUTPATIENT_CLINIC_OR_DEPARTMENT_OTHER)
Admission: RE | Admit: 2024-04-02 | Discharge: 2024-04-02 | Disposition: A | Discharge status: Home / Self Care | Source: Ambulatory Visit | Attending: Oncology | Admitting: Oncology

## 2024-04-02 DIAGNOSIS — I2510 Atherosclerotic heart disease of native coronary artery without angina pectoris: Secondary | ICD-10-CM | POA: Diagnosis not present

## 2024-04-02 DIAGNOSIS — R1011 Right upper quadrant pain: Secondary | ICD-10-CM | POA: Diagnosis not present

## 2024-04-02 DIAGNOSIS — Z853 Personal history of malignant neoplasm of breast: Secondary | ICD-10-CM | POA: Insufficient documentation

## 2024-04-02 DIAGNOSIS — R0781 Pleurodynia: Secondary | ICD-10-CM | POA: Diagnosis not present

## 2024-04-02 DIAGNOSIS — R0789 Other chest pain: Secondary | ICD-10-CM | POA: Diagnosis not present

## 2024-04-07 DIAGNOSIS — D485 Neoplasm of uncertain behavior of skin: Secondary | ICD-10-CM | POA: Diagnosis not present

## 2024-04-07 DIAGNOSIS — L309 Dermatitis, unspecified: Secondary | ICD-10-CM | POA: Diagnosis not present

## 2024-04-07 DIAGNOSIS — Z85828 Personal history of other malignant neoplasm of skin: Secondary | ICD-10-CM | POA: Diagnosis not present

## 2024-04-07 DIAGNOSIS — L820 Inflamed seborrheic keratosis: Secondary | ICD-10-CM | POA: Diagnosis not present

## 2024-04-07 DIAGNOSIS — D1801 Hemangioma of skin and subcutaneous tissue: Secondary | ICD-10-CM | POA: Diagnosis not present

## 2024-04-21 ENCOUNTER — Inpatient Hospital Stay: Attending: Oncology

## 2024-04-21 ENCOUNTER — Inpatient Hospital Stay: Admitting: Oncology

## 2024-04-21 VITALS — BP 163/50 | HR 57 | Temp 96.7°F | Resp 16 | Wt 118.3 lb

## 2024-04-21 DIAGNOSIS — R5382 Chronic fatigue, unspecified: Secondary | ICD-10-CM | POA: Diagnosis not present

## 2024-04-21 DIAGNOSIS — Z08 Encounter for follow-up examination after completed treatment for malignant neoplasm: Secondary | ICD-10-CM | POA: Insufficient documentation

## 2024-04-21 DIAGNOSIS — D7589 Other specified diseases of blood and blood-forming organs: Secondary | ICD-10-CM

## 2024-04-21 DIAGNOSIS — Z853 Personal history of malignant neoplasm of breast: Secondary | ICD-10-CM | POA: Insufficient documentation

## 2024-04-21 DIAGNOSIS — I4891 Unspecified atrial fibrillation: Secondary | ICD-10-CM | POA: Insufficient documentation

## 2024-04-21 DIAGNOSIS — R718 Other abnormality of red blood cells: Secondary | ICD-10-CM | POA: Diagnosis not present

## 2024-04-21 DIAGNOSIS — E049 Nontoxic goiter, unspecified: Secondary | ICD-10-CM

## 2024-04-21 DIAGNOSIS — J9811 Atelectasis: Secondary | ICD-10-CM | POA: Diagnosis not present

## 2024-04-21 LAB — CBC WITH DIFFERENTIAL (CANCER CENTER ONLY)
Abs Immature Granulocytes: 0.06 K/uL (ref 0.00–0.07)
Basophils Absolute: 0 K/uL (ref 0.0–0.1)
Basophils Relative: 1 %
Eosinophils Absolute: 0.1 K/uL (ref 0.0–0.5)
Eosinophils Relative: 1 %
HCT: 36.4 % (ref 36.0–46.0)
Hemoglobin: 11.9 g/dL — ABNORMAL LOW (ref 12.0–15.0)
Immature Granulocytes: 1 %
Lymphocytes Relative: 35 %
Lymphs Abs: 2.5 K/uL (ref 0.7–4.0)
MCH: 33.1 pg (ref 26.0–34.0)
MCHC: 32.7 g/dL (ref 30.0–36.0)
MCV: 101.1 fL — ABNORMAL HIGH (ref 80.0–100.0)
Monocytes Absolute: 0.4 K/uL (ref 0.1–1.0)
Monocytes Relative: 6 %
Neutro Abs: 4 K/uL (ref 1.7–7.7)
Neutrophils Relative %: 56 %
Platelet Count: 233 K/uL (ref 150–400)
RBC: 3.6 MIL/uL — ABNORMAL LOW (ref 3.87–5.11)
RDW: 13.2 % (ref 11.5–15.5)
WBC Count: 7 K/uL (ref 4.0–10.5)
nRBC: 0 % (ref 0.0–0.2)

## 2024-04-21 LAB — RETICULOCYTES
Immature Retic Fract: 5.1 % (ref 2.3–15.9)
RBC.: 3.57 MIL/uL — ABNORMAL LOW (ref 3.87–5.11)
Retic Count, Absolute: 47.1 K/uL (ref 19.0–186.0)
Retic Ct Pct: 1.3 % (ref 0.4–3.1)

## 2024-04-21 LAB — CMP (CANCER CENTER ONLY)
ALT: 10 U/L (ref 0–44)
AST: 18 U/L (ref 15–41)
Albumin: 4 g/dL (ref 3.5–5.0)
Alkaline Phosphatase: 48 U/L (ref 38–126)
Anion gap: 10 (ref 5–15)
BUN: 28 mg/dL — ABNORMAL HIGH (ref 8–23)
CO2: 26 mmol/L (ref 22–32)
Calcium: 9.3 mg/dL (ref 8.9–10.3)
Chloride: 104 mmol/L (ref 98–111)
Creatinine: 1.13 mg/dL — ABNORMAL HIGH (ref 0.44–1.00)
GFR, Estimated: 49 mL/min — ABNORMAL LOW (ref >60)
Glucose, Bld: 94 mg/dL (ref 70–99)
Potassium: 3.9 mmol/L (ref 3.5–5.1)
Sodium: 140 mmol/L (ref 135–145)
Total Bilirubin: 0.6 mg/dL (ref 0.0–1.2)
Total Protein: 6.4 g/dL — ABNORMAL LOW (ref 6.5–8.1)

## 2024-04-21 NOTE — Progress Notes (Signed)
 [facility]  HEMATOLOGY CLINIC PROGRESS NOTE  PATIENT NAME: Brittany Kirk   MR#: [id number] DOB: Dec 01, 1945  Patient Care Team: Duanne Butler DASEN, MD as PCP - General (Family Medicine) Court Dorn PARAS, MD as PCP - Cardiology (Cardiology) Inocencio Soyla Lunger, MD as PCP - Electrophysiology (Cardiology) Keenan Hastings, MD as Consulting Physician (Radiation Oncology) Mat Browning, MD as Consulting Physician (Obstetrics and Gynecology) Rosalie Kitchens, MD as Consulting Physician (Gastroenterology) Nicholaus Sherlean CROME, [facility] (Inactive) as Pharmacist (Pharmacist) Cleotilde Elspeth CROME, OD (Optometry)  Date of visit: 04/21/2024   ASSESSMENT & PLAN:   ZANAI MALLARI is a 78 y.o. lady with a past medical history of CAD, CKD stage III, remote history of right sided breast cancer in 2015, GERD, hypertension, dyslipidemia, osteoporosis, was referred to our service in August 2025 for evaluation of RBC macrocytosis.     Macrocytosis Macrocytosis with fluctuating hemoglobin levels. Hemoglobin was normal at 12.3 g/dL during the last check, but previously low at 10.8 g/dL. Red blood cell size is enlarged, possibly due to medication effects, improper B12 utilization, or high cell turnover. Iron levels were normal, and B12 was elevated. Iron supplementation is not beneficial due to normal iron levels and experienced side effects.  On her consultation with us  on 01/20/2024, labs showed normal hemoglobin of 12.8.  MCV remains slightly elevated at 102.6.  White count and platelet count were within normal limits.  CMP unremarkable.  Vitamin B12, folate are within normal limits.  Iron studies normal.  Methylmalonic acid was also normal.  Labs today showed almost normal MCV of 100.3.  Hemoglobin stable at 12.2.  No additional workup or intervention is needed at this time for this.  - It is likely that mild macrocytosis is from high cell turnover.  Clinical picture not concerning for MDS or  other bone marrow disorders at this time.  Continue to monitor for now.  RTC in November as scheduled.   Right breast cancer, status post lumpectomy and right axillary lymph node dissection Status post lumpectomy and right axillary lymph node dissection with 18 lymph nodes removed. Recent mammogram and ultrasound in August 2025 were clear. No palpable lymphadenopathy under the arm. Reports intermittent sweating under the right arm, a new symptom.  Right-sided chest wall and upper abdominal pain Persistent pain under the right breast and in the right upper abdomen, exacerbated by pressure. No associated nausea, vomiting, or changes with food intake. Previous mammogram and ultrasound were clear. Prior abdominal scan in February showed no major concerns.  - CT chest without contrast on 04/02/2024 showed no acute findings.  5 mm right lower lobe lung nodule and few additional 2 mm nodules, too small.  Heterogeneous and mildly enlarged thyroid  gland.   I spent a total of 30 minutes during this encounter with the patient including review of chart and various tests results, discussions about plan of care and coordination of care plan.  I reviewed lab results and outside records for this visit and discussed relevant results with the patient. Diagnosis, plan of care and treatment options were also discussed in detail with the patient. Opportunity provided to ask questions and answers provided to her apparent satisfaction. Provided instructions to call our clinic with any problems, questions or concerns prior to return visit. I recommended to continue follow-up with PCP and sub-specialists. She verbalized understanding and agreed with the plan. No barriers to learning was detected.  [name], MD  04/21/2024 2:48 PM  [facility], [facility], [facility] CANCER  [facility] - A DEPT OF [facility] [address] [place] [address] Dept: [phone] Dept Fax:  [phone]   CHIEF COMPLAINT/ REASON FOR VISIT:  Follow-up for chronic, mild RBC macrocytosis.  Grossly negative hematological workup.  Likely from increased cell turnover.  INTERVAL HISTORY:  Discussed the use of AI scribe software for clinical note transcription with the patient, who gave verbal consent to proceed.  History of Present Illness DOROTHYE Kirk is an 78 year old female with breast cancer who presents with right-sided rib and abdominal pain.  She has been experiencing pain under her right breast for about two weeks, exacerbated by pressure from her bra and when lying on her right side. The pain extends to her right abdomen and is associated with her rib cage. There is no change in pain with eating, and it is not accompanied by acid reflux or heartburn.  She has a history of breast cancer and underwent a lumpectomy with removal of 18 lymph nodes on the right side. She notes occasional sweating under her right arm, which she has not experienced in over 20 years. She denies any recent trauma or injury to the area.  Her bowel movements occur every couple of days and are described as normal. She experiences nausea but no vomiting, and she forces herself to eat despite a lack of appetite. She reports feeling tired all the time and experiences increased thirst.  She is currently taking vitamin B12. She denies any urinary symptoms such as burning, blood in urine, or pain during urination.  She mentions a bacterial skin infection diagnosed by her dermatologist, for which she is applying an ointment. There is no rash present in the areas of pain.   SUMMARY OF HEMATOLOGIC HISTORY:  She was referred by her primary care physician for evaluation of abnormal red blood cell size.   She reports that her primary care physician told her she has chronic anemia, and that her last two blood tests showed slightly abnormal red blood cell size. Despite this, her B12 levels are high and iron  levels are normal. She experiences side effects such as headaches, nausea, and constipation from iron supplements.   She experiences rib pain and has a history of breast cancer. She reports fatigue and a significant decrease in energy, impacting her ability to maintain her household. 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 She recently stopped taking prescription Prilosec as she no longer experiences acid reflux.   Her weight has been stable around 116-118 pounds, although she has been trying to eat more to maintain her weight. No alcohol consumption.   On her consultation with us  on 01/20/2024, labs showed normal hemoglobin of 12.8.  MCV remains slightly elevated at 102.6.  White count and platelet count were within normal limits.  CMP unremarkable.  Vitamin B12, folate are within normal limits.  Iron studies normal.  Methylmalonic acid was also normal.   - It is likely that mild macrocytosis is from high cell turnover.  Clinical picture not concerning for MDS or other bone marrow disorders at this time.   Continue to monitor for now.  I have reviewed the past medical history, past surgical history, social history and family history with the patient and they are unchanged from previous note.  ALLERGIES: She is allergic to metronidazole, penicillins, fosamax  [alendronate ], levofloxacin , mexiletine, trimethoprim, cefaclor, latex, naproxen, nsaids, septra [bactrim], sulfonamide derivatives, tape, and wound dressing adhesive.  MEDICATIONS:  Current Outpatient Medications  Medication Sig Dispense Refill   acetaminophen  (TYLENOL ) 500 MG tablet Take 1,000 mg by mouth every 6 (six) hours as needed for moderate pain or headache.     apixaban  (ELIQUIS ) 2.5 MG TABS tablet Take 1 tablet (2.5 mg total)  by mouth 2 (two) times daily. 180 tablet 1   ascorbic acid  (VITAMIN C) 1000 MG tablet Take 1,000 mg by mouth daily.     atorvastatin  (LIPITOR) 40 MG tablet Take 1 tablet (40 mg total) by mouth daily. 90 tablet 1   cholecalciferol  (VITAMIN D3) 25 MCG (1000 UNIT) tablet Take 1,000 Units by mouth daily.     cyanocobalamin  (VITAMIN B12) 1000 MCG tablet Take 1,000 mcg by mouth daily.     diazepam  (VALIUM ) 5 MG tablet Take 5 mg by mouth as needed for sedation.     omeprazole (PRILOSEC) 20 MG capsule Take 20 mg by mouth daily.     valsartan  (DIOVAN ) 40 MG tablet Take 1 tablet (40 mg total) by mouth daily. Hold for Blood Pressure <120 (Patient taking differently: Take 40 mg by mouth daily. Hold for Blood Pressure <120 Takes about x3 weekly) 90 tablet 3   No current facility-administered medications for this visit.     REVIEW OF SYSTEMS:    Review of Systems - Oncology  All other pertinent systems were reviewed with the patient and are negative.  PHYSICAL EXAMINATION:   Onc Performance Status - 04/21/24 1437       ECOG Perf Status   ECOG Perf Status Ambulatory and capable of all selfcare but unable to carry out any work activities.  Up and about more than 50% of waking hours      KPS SCALE   KPS % SCORE Cares for self, unable to carry on normal activity or to do active work           Vitals:   04/21/24 1412 04/21/24 1414  BP: (!) 166/52 (!) 163/50  Pulse: (!) 57   Resp: 16   Temp: (!) 96.7 F (35.9 C)   SpO2: 98%    Filed Weights   04/21/24 1412  Weight: 118 lb 4.8 oz (53.7 kg)    Physical Exam Constitutional:      General: She is not in acute distress.    Appearance: Normal appearance.  HENT:     Head: Normocephalic and atraumatic.  Cardiovascular:     Rate and Rhythm: Normal rate.  Pulmonary:     Effort: Pulmonary effort is normal. No respiratory distress.  Chest:     Comments: Mild tenderness noted on the anterior lower rib cage on the right side.  No axillary  lymphadenopathy palpable. Abdominal:     General: There is no distension.  Neurological:     General: No focal deficit present.     Mental Status: She is alert and oriented to person, place, and time.  Psychiatric:        Mood and Affect: Mood normal.        Behavior: Behavior normal.      LABORATORY DATA:   I have reviewed the data as listed.  Results for orders placed or performed in visit on 04/21/24  Reticulocytes  Result Value Ref Range   Retic Ct Pct 1.3 0.4 - 3.1 %   RBC. 3.57 (L) 3.87 - 5.11  MIL/uL   Retic Count, Absolute 47.1 19.0 - 186.0 K/uL   Immature Retic Fract 5.1 2.3 - 15.9 %  CMP (Cancer Center only)  Result Value Ref Range   Sodium 140 135 - 145 mmol/L   Potassium 3.9 3.5 - 5.1 mmol/L   Chloride 104 98 - 111 mmol/L   CO2 26 22 - 32 mmol/L   Glucose, Bld 94 70 - 99 mg/dL   BUN 28 (H) 8 - 23 mg/dL   Creatinine 8.86 (H) 9.55 - 1.00 mg/dL   Calcium  9.3 8.9 - 10.3 mg/dL   Total Protein 6.4 (L) 6.5 - 8.1 g/dL   Albumin 4.0 3.5 - 5.0 g/dL   AST 18 15 - 41 U/L   ALT 10 0 - 44 U/L   Alkaline Phosphatase 48 38 - 126 U/L   Total Bilirubin 0.6 0.0 - 1.2 mg/dL   GFR, Estimated 49 (L) >60 mL/min   Anion gap 10 5 - 15  CBC with Differential (Cancer Center Only)  Result Value Ref Range   WBC Count 7.0 4.0 - 10.5 K/uL   RBC 3.60 (L) 3.87 - 5.11 MIL/uL   Hemoglobin 11.9 (L) 12.0 - 15.0 g/dL   HCT 63.5 63.9 - 53.9 %   MCV 101.1 (H) 80.0 - 100.0 fL   MCH 33.1 26.0 - 34.0 pg   MCHC 32.7 30.0 - 36.0 g/dL   RDW 86.7 88.4 - 84.4 %   Platelet Count 233 150 - 400 K/uL   nRBC 0.0 0.0 - 0.2 %   Neutrophils Relative % 56 %   Neutro Abs 4.0 1.7 - 7.7 K/uL   Lymphocytes Relative 35 %   Lymphs Abs 2.5 0.7 - 4.0 K/uL   Monocytes Relative 6 %   Monocytes Absolute 0.4 0.1 - 1.0 K/uL   Eosinophils Relative 1 %   Eosinophils Absolute 0.1 0.0 - 0.5 K/uL   Basophils Relative 1 %   Basophils Absolute 0.0 0.0 - 0.1 K/uL   Immature Granulocytes 1 %   Abs Immature Granulocytes  0.06 0.00 - 0.07 K/uL     RADIOGRAPHIC STUDIES:  I have personally reviewed the radiological images as listed and agree with the findings in the report.  CT Chest Wo Contrast EXAM: CT CHEST WITHOUT CONTRAST 04/02/2024 04:47:00 PM  TECHNIQUE: CT of the chest was performed without the administration of intravenous contrast. Multiplanar reformatted images are provided for review. Automated exposure control, iterative reconstruction, and/or weight based adjustment of the mA/kV was utilized to reduce the radiation dose to as low as reasonably achievable.  COMPARISON: None available.  CLINICAL HISTORY: Hx of right breast cancer. Now with peristent rib cage pain and RUQ abdominal pain. Please evaluate. Pt c/o RUQ pain, R lower rib pain. HX R sided breast CA  FINDINGS:  MEDIASTINUM: The heart is mildly enlarged. There are atherosclerotic calcifications of the aorta and coronary arteries. The thyroid  gland is heterogeneous and mildly enlarged. The central airways are clear.  LYMPH NODES: No mediastinal, hilar or axillary lymphadenopathy.  LUNGS AND PLEURA: There is some atelectasis in the lingula. There is a 5 mm right lower lobe nodule image 3/60. There are a few other scattered 2 mm nodular densities. There is no focal lung infiltrate. No pulmonary edema. No pleural effusion or pneumothorax.  SOFT TISSUES/BONES: There are surgical clips and scarring in the right axilla. There is skin thickening of the right breast. Cholecystectomy clips are present. No chest wall mass or focal osseous lesion identified. The bones are  osteopenic. Degenerative changes affect the spine. There are mild compression deformities of T3, T4 and T5 which are favored as chronic. No acute fractures are identified.  UPPER ABDOMEN: Limited images of the upper abdomen demonstrates no acute abnormality.  IMPRESSION: 1. No acute findings. 2. Heterogeneous and mildly enlarged thyroid  gland; recommend  non-emergent thyroid  ultrasound. 3. 5 mm solid right lower lobe pulmonary nodule with a few additional 2 mm nodules; no routine follow-up imaging is recommended as per Fleischner Society Guidelines, assuming no cancer history or immunocompromise.  Electronically signed by: Greig Pique MD 04/05/2024 11:00 PM EDT RP Workstation: [id number]    No orders of the defined types were placed in this encounter.    Future Appointments  Date Time Provider Department Center  06/23/2024  1:30 PM Leroux-Martinez, Rosaline Jansky, AUD [facility] None  06/23/2024  2:00 PM Tobie Eldora NOVAK, MD [facility] None     This document was completed utilizing speech recognition software. Grammatical errors, random word insertions, pronoun errors, and incomplete sentences are an occasional consequence of this system due to software limitations, ambient noise, and hardware issues. Any formal questions or concerns about the content, text or information contained within the body of this dictation should be directly addressed to the provider for clarification.

## 2024-04-21 NOTE — Assessment & Plan Note (Signed)
 Macrocytosis with fluctuating hemoglobin levels. Hemoglobin was normal at 12.3 g/dL during the last check, but previously low at 10.8 g/dL. Red blood cell size is enlarged, possibly due to medication effects, improper B12 utilization, or high cell turnover. Iron levels were normal, and B12 was elevated. Iron supplementation is not beneficial due to normal iron levels and experienced side effects.  On her consultation with us  on 01/20/2024, labs showed normal hemoglobin of 12.8.  MCV remains slightly elevated at 102.6.  White count and platelet count were within normal limits.  CMP unremarkable.  Vitamin B12, folate are within normal limits.  Iron studies normal.  Methylmalonic acid was also normal.  Labs today showed almost normal MCV of 100.3.  Hemoglobin stable at 12.2.  No additional workup or intervention is needed at this time for this.  - It is likely that mild macrocytosis is from high cell turnover.  Clinical picture not concerning for MDS or other bone marrow disorders at this time.  Continue to monitor for now.  RTC in November as scheduled.

## 2024-04-22 ENCOUNTER — Encounter: Payer: Self-pay | Admitting: Oncology

## 2024-04-27 ENCOUNTER — Ambulatory Visit (HOSPITAL_BASED_OUTPATIENT_CLINIC_OR_DEPARTMENT_OTHER)
Admission: RE | Admit: 2024-04-27 | Discharge: 2024-04-27 | Disposition: A | Discharge status: Home / Self Care | Source: Ambulatory Visit | Attending: Oncology | Admitting: Oncology

## 2024-04-27 DIAGNOSIS — D7589 Other specified diseases of blood and blood-forming organs: Secondary | ICD-10-CM | POA: Diagnosis not present

## 2024-04-27 DIAGNOSIS — E042 Nontoxic multinodular goiter: Secondary | ICD-10-CM | POA: Diagnosis not present

## 2024-05-11 ENCOUNTER — Encounter: Payer: Self-pay | Admitting: Cardiovascular Disease

## 2024-05-11 DIAGNOSIS — I10 Essential (primary) hypertension: Secondary | ICD-10-CM

## 2024-05-12 MED ORDER — VALSARTAN 40 MG PO TABS: 40.0000 mg | ORAL_TABLET | Freq: Every day | ORAL | 0 refills | Status: AC

## 2024-05-13 DIAGNOSIS — R110 Nausea: Secondary | ICD-10-CM | POA: Diagnosis not present

## 2024-05-13 DIAGNOSIS — R1084 Generalized abdominal pain: Secondary | ICD-10-CM | POA: Diagnosis not present

## 2024-05-29 ENCOUNTER — Encounter: Payer: Self-pay | Admitting: Cardiovascular Disease

## 2024-06-01 ENCOUNTER — Other Ambulatory Visit: Payer: Self-pay | Admitting: Gastroenterology

## 2024-06-01 DIAGNOSIS — R1084 Generalized abdominal pain: Secondary | ICD-10-CM

## 2024-06-08 ENCOUNTER — Ambulatory Visit
Admission: RE | Admit: 2024-06-08 | Discharge: 2024-06-08 | Disposition: A | Discharge status: Home / Self Care | Source: Ambulatory Visit | Attending: Gastroenterology | Admitting: Gastroenterology

## 2024-06-08 DIAGNOSIS — R1084 Generalized abdominal pain: Secondary | ICD-10-CM

## 2024-06-23 ENCOUNTER — Ambulatory Visit (INDEPENDENT_AMBULATORY_CARE_PROVIDER_SITE_OTHER): Admitting: Otolaryngology

## 2024-06-23 ENCOUNTER — Encounter (INDEPENDENT_AMBULATORY_CARE_PROVIDER_SITE_OTHER): Payer: Self-pay

## 2024-06-23 ENCOUNTER — Ambulatory Visit (INDEPENDENT_AMBULATORY_CARE_PROVIDER_SITE_OTHER): Admitting: Audiology

## 2024-06-23 ENCOUNTER — Encounter (INDEPENDENT_AMBULATORY_CARE_PROVIDER_SITE_OTHER): Payer: Self-pay | Admitting: Otolaryngology

## 2024-06-23 VITALS — BP 121/76 | HR 54 | Ht 59.0 in

## 2024-06-23 DIAGNOSIS — H6121 Impacted cerumen, right ear: Secondary | ICD-10-CM

## 2024-06-23 DIAGNOSIS — H60311 Diffuse otitis externa, right ear: Secondary | ICD-10-CM | POA: Diagnosis not present

## 2024-06-23 DIAGNOSIS — H903 Sensorineural hearing loss, bilateral: Secondary | ICD-10-CM

## 2024-06-23 DIAGNOSIS — H938X3 Other specified disorders of ear, bilateral: Secondary | ICD-10-CM

## 2024-06-23 MED ORDER — OFLOXACIN 0.3 % OT SOLN: 4.0000 [drp] | Freq: Two times a day (BID) | OTIC | 1 refills | Status: AC

## 2024-06-23 NOTE — Patient Instructions (Signed)
 Use ofloxacin  ear drops 4 drops right ear starting in 2 weeks (January 27) --- use for 2 weeks Always use half a cotton ball with Vaseline to create a seal when you bathe or shower to protect from water entering your ear canal.

## 2024-06-23 NOTE — Progress Notes (Unsigned)
" °  [address] [place], [place] [address] [phone]  Audiological Evaluation    Name: Brittany Kirk     DOB:   1945-11-03      MRN:   [id number]                                                                                     Service Date: 06/23/2024     Accompanied by: self    Patient comes today after Dr. Tobie, ENT sent a referral for a hearing evaluation due to concerns with hearing loss.   Symptoms Yes Details  Hearing loss  [x]  Used drops for 5 days, more in the right ear  Tinnitus  []    Ear pain/ infections/pressure  []    Balance problems  []    Noise exposure history  []    Previous ear surgeries  []    Family history of hearing loss  []    Amplification  []    Other  []      Otoscopy: Right ear: Clear external ear canal and notable landmarks visualized on the tympanic membrane. Left ear:  Clear external ear canal and notable landmarks visualized on the tympanic membrane.  Tympanometry: Right ear: Type A - Normal external ear canal volume with normal middle ear pressure and normal tympanic membrane compliance. Findings are consistent with normal middle ear function. Left ear: Type A - Normal external ear canal volume with normal middle ear pressure and normal tympanic membrane compliance. Findings are consistent with normal middle ear function.  Hearing Evaluation The hearing test results were completed {transducer options:31388} and results are deemed to be of {test reliability:31390::good reliability}. Test technique:  {audiometric test technique:31400::conventional}    Pure tone Audiometry: Right ear- *** {hearing loss types:31372::sensorineural hearing loss} from *** Hz - *** Hz. Left ear-  *** {hearing loss types:31372::sensorineural hearing loss} from *** Hz - *** Hz.  Speech Audiometry: Right ear- {AUD SRT/SAT:19348}. Left ear-{AUD SRT/SAT:19348}.   Word Recognition Score Tested using {word lists:31376::NU-6 (recorded)} Right  ear: ***% was obtained at a presentation level of *** dBHL with contralateral masking which is deemed as  {word recognition score:31373}. Left ear: ***% was obtained at a presentation level of *** dBHL with contralateral masking which is deemed as  {word recognition score:31373}.   Impression: {Word recognition Score interpretation:31432::There is not a significant difference in pure-tone thresholds between ears.,There is not a significant difference in the word recognition score in between ears. }   Recommendations: {Audiology Recommendations:31370::Follow up with ENT as scheduled.}   Noralyn Karim MARIE LEROUX-MARTINEZ, AUD  "

## 2024-06-23 NOTE — Progress Notes (Signed)
 Dear Dr. Duanne, Here is my assessment for our mutual patient, Brittany Kirk. Thank you for allowing me the opportunity to care for your patient. Please do not hesitate to contact me should you have any other questions. Sincerely, Dr. Eldora Blanch  Otolaryngology Clinic Note Referring provider: Dr. Duanne HPI:  Brittany Kirk is a 79 y.o. female kindly referred by Dr. Duanne for evaluation of cerumen impaction  Initial visit (07/2023): Patient reports: b/l cerumen impaction and issues with wax; she reports she has had history of b/l TM perforation, and thus cannot get irrigated; some fullness b/l and longstanding hearing loss. Ear infections as a child Patient denies: ear pain,vertigo, drainage Patient additionally denies: deep pain in ear canal, eustachian tube symptoms such as popping Patient also denies barotrauma, vestibular suppressant use, ototoxic medication use Prior ear surgery: ear tubes as child  --------------------------------------------------------- 06/23/2024 Returns for follow up. No issues with ears currently except b/l HL and some fullness especially on the right with some drainage. No ear pain. She did get HA from [employer] and has been using those.   H&N Surgery: T&A Personal or FHx of bleeding dz or anesthesia difficulty: no   GLP-1: no AP/AC: Eliquis   Tobacco: quit, prior smoker  PMHx: HTN, GERD, A-fib on Apixaban  (s/p ablation), b/l ICA stenosis, PAD  Independent Review of Additional Tests or Records:  Dr. Duanne (IM) (PCP) 1//14/2025: noted b/l cerumen impactions and hearing loss; no other ear issues; Rx: ref to [facility]NT [facility] 07/06/2016 independently interpreted with respect to sinuses and ears: mastoids and ME well aerated b/l; cuts thick so cannot note any perforation; no significant paranasal sinus disease; minimal left ethmoid disease 09/2023 Audiogram was independently reviewed and interpreted by me and it reveals - b/l essentially symmetric downsloping SNHL  with WRT >80% AU at 75 and 85dB AD and AS respectively; A/A tymps  SNHL= Sensorineural hearing loss   PMH/Meds/All/SocHx/FamHx/ROS:   Past Medical History:  Diagnosis Date   Acid reflux    Allergic rhinitis    Allergy    Anxiety    Arthritis    Asymptomatic bilateral carotid artery stenosis    40-59% (02/2018)   Atrial fibrillation (HCC) 07/2019   Breast cancer (HCC) 2015   Right Breast Cancer   CAD (coronary artery disease)    CKD (chronic kidney disease), stage III (HCC)    Depression    Emphysema    no longer uses inhalers   Gallstones    nausea, pain upper right abdomen   GERD (gastroesophageal reflux disease)    H/O hiatal hernia    Hiatal hernia    History of skin cancer    Hyperlipidemia    Hypertension    Multinodular goiter (nontoxic)    Osteoporosis    Scoliosis    Skin cancer    Tobacco user    Wears dentures    top     Past Surgical History:  Procedure Laterality Date   ABDOMINAL AORTOGRAM W/LOWER EXTREMITY N/A 02/05/2022   Procedure: ABDOMINAL AORTOGRAM W/LOWER EXTREMITY;  Surgeon: Court Dorn PARAS, MD;  Location: [facility] INVASIVE CV LAB;  Service: Cardiovascular;  Laterality: N/A;   ATRIAL FIBRILLATION ABLATION N/A 04/14/2021   Procedure: ATRIAL FIBRILLATION ABLATION;  Surgeon: Inocencio Soyla Lunger, MD;  Location: [facility] INVASIVE CV LAB;  Service: Cardiovascular;  Laterality: N/A;   BREAST LUMPECTOMY Right 2015   CHOLECYSTECTOMY  07/08/2012   Procedure: LAPAROSCOPIC CHOLECYSTECTOMY WITH INTRAOPERATIVE CHOLANGIOGRAM;  Surgeon: Camellia CHRISTELLA Blush, MD,FACS;  Location: [facility] ORS;  Service: General;  Laterality:  N/A;  Laparoscopic Cholecystectomy with Intraoperative Cholangiogram   COLONOSCOPY     ESOPHAGOSCOPY N/A 05/29/2017   Procedure: ESOPHAGOSCOPY;  Surgeon: Rosalie Kitchens, MD;  Location: [facility] ENDOSCOPY;  Service: Endoscopy;  Laterality: N/A;  botox  injection   EYE SURGERY     HAND SURGERY Left    wrist   LEFT HEART CATH AND CORONARY ANGIOGRAPHY N/A 10/11/2017    Procedure: LEFT HEART CATH AND CORONARY ANGIOGRAPHY;  Surgeon: Jordan, Peter M, MD;  Location: [facility] INVASIVE CV LAB;  Service: Cardiovascular;  Laterality: N/A;   TONSILECTOMY, ADENOIDECTOMY, BILATERAL MYRINGOTOMY AND TUBES     TUBAL LIGATION      Family History  Problem Relation Age of Onset   Arthritis Mother    Pancreatic cancer Mother    Cancer Mother    Heart disease Other        5 brothers and 2 sister   Diverticulitis Sister    Heart disease Sister    Aplastic anemia Other    Heart attack Paternal Grandfather        Heart Attacl   Cancer Brother    Heart disease Brother    Heart disease Brother    Heart disease Sister    Heart disease Brother    Heart disease Brother    Cancer Brother    Heart disease Brother      Social Connections: Moderately Integrated (11/22/2023)   Social Connection and Isolation Panel    Frequency of Communication with Friends and Family: Twice a week    Frequency of Social Gatherings with Friends and Family: Twice a week    Attends Religious Services: More than 4 times per year    Active Member of [employer] or Organizations: No    Attends Engineer, Structural: Not on file    Marital Status: Married      Current Outpatient Medications:    acetaminophen  (TYLENOL ) 500 MG tablet, Take 1,000 mg by mouth every 6 (six) hours as needed for moderate pain or headache., Disp: , Rfl:    apixaban  (ELIQUIS ) 2.5 MG TABS tablet, Take 1 tablet (2.5 mg total) by mouth 2 (two) times daily., Disp: 180 tablet, Rfl: 1   ascorbic acid  (VITAMIN C) 1000 MG tablet, Take 1,000 mg by mouth daily., Disp: , Rfl:    atorvastatin  (LIPITOR) 40 MG tablet, Take 1 tablet (40 mg total) by mouth daily., Disp: 90 tablet, Rfl: 1   cholecalciferol  (VITAMIN D3) 25 MCG (1000 UNIT) tablet, Take 1,000 Units by mouth daily., Disp: , Rfl:    cyanocobalamin  (VITAMIN B12) 1000 MCG tablet, Take 1,000 mcg by mouth daily., Disp: , Rfl:    diazepam  (VALIUM ) 5 MG tablet, Take 5 mg by mouth as  needed for sedation., Disp: , Rfl:    omeprazole (PRILOSEC) 20 MG capsule, Take 20 mg by mouth daily., Disp: , Rfl:    valsartan  (DIOVAN ) 40 MG tablet, Take 1 tablet (40 mg total) by mouth daily. Hold for Blood Pressure <120, Disp: 90 tablet, Rfl: 0   Physical Exam:   BP 121/76 (BP Location: Left Arm, Patient Position: Sitting, Cuff Size: Large)   Pulse (!) 54   Ht 4' 11 (1.499 m)   SpO2 97%   BMI 23.89 kg/m   Salient findings:  CN II-XII intact Given history and complaints, ear microscopy was indicated and performed for evaluation with findings as below in physical exam section and in procedures Right continued mild-modest pars flaccida retraction but complete ceruminous wet debris with some purulence --  impacted onto TM; gently cleaned, able to see some myringosclerosis posteriorly but cannot see anterior TM --- CSF powder applied - left TM is intact, small monomeric area anterior; no epithelial debris appreciated Weber 512: mid Rinne 512: AC > BC b/l    Seprately Identifiable Procedures:  Procedure: Bilateral ear microscopy and cerumen removal using microscope (CPT [phone]) - Mod 25 Pre-procedure diagnosis: Cerumen impaction right external ears Post-procedure diagnosis: same Indication: Right cerumen impaction; given patient's otologic complaints and history as well as for improved and comprehensive examination of external ear and tympanic membrane, bilateral otologic examination using microscope was performed and impacted cerumen removed  Procedure: Patient was placed semi-recumbent. Both ear canals were examined using the microscope with findings above. Impacted Cerumen and debris removed on right using suction and currette with improvement in EAC examination and patency. CSF powder applied afterwards right ear  Patient tolerated the procedure well.       Impression & Plans:  Sibbie Flammia is a 79 y.o. female with:  1. Sensorineural hearing loss (SNHL) of both ears   2.  Sensation of fullness in both ears   3. Impacted cerumen of right ear   4. Acute diffuse otitis externa of right ear    Concern for b/l TM perforation which she reports long-standing with prior noted b/l myringosclerosis and modest retraction but no evidence of cholesteatoma.   She does have some myringitis/OE appearing on the right ear which was cleaned and CSF powder applied  - Recommend ofloxacin  starting in 2 weeks for 2 weeks - Keep ear dry - f/u in 6 weeks  See below regarding exact medications prescribed this encounter including dosages and route: No orders of the defined types were placed in this encounter.     Thank you for allowing me the opportunity to care for your patient. Please do not hesitate to contact me should you have any other questions.  Sincerely, Eldora Blanch, MD Otolaryngologist (ENT), [facility] Phone: [phone] Fax: [phone]  06/23/2024, 2:45 PM   MDM:  [id number] Complexity/Problems addressed: mod - multiple problems, one chronic with exacerbation Data complexity: low - Morbidity: mod - Prescription Drug prescribed or managed: yes

## 2024-06-26 ENCOUNTER — Telehealth (INDEPENDENT_AMBULATORY_CARE_PROVIDER_SITE_OTHER): Payer: Self-pay

## 2024-06-26 NOTE — Telephone Encounter (Signed)
 Patient called asking if it is okay to use her hearing aid now. Patient stated that Dr. Tobie sprayed a powder in her ears she wanted to confirm it was safe to put her hearing aids back in and use them. Please advise.

## 2024-06-29 NOTE — Telephone Encounter (Signed)
 Called patient to let them know Per Dr. Tobie it was safe to wear her hearing aid again. Patient understood.

## 2024-07-06 ENCOUNTER — Encounter (HOSPITAL_COMMUNITY): Payer: Self-pay | Admitting: *Deleted

## 2024-07-06 ENCOUNTER — Other Ambulatory Visit: Payer: Self-pay

## 2024-07-06 ENCOUNTER — Emergency Department (HOSPITAL_COMMUNITY)

## 2024-07-06 ENCOUNTER — Emergency Department (HOSPITAL_COMMUNITY)
Admission: EM | Admit: 2024-07-06 | Discharge: 2024-07-06 | Disposition: A | Discharge status: Home / Self Care | Attending: Emergency Medicine | Admitting: Emergency Medicine

## 2024-07-06 DIAGNOSIS — R079 Chest pain, unspecified: Secondary | ICD-10-CM

## 2024-07-06 DIAGNOSIS — I4891 Unspecified atrial fibrillation: Secondary | ICD-10-CM | POA: Diagnosis not present

## 2024-07-06 DIAGNOSIS — Z7901 Long term (current) use of anticoagulants: Secondary | ICD-10-CM | POA: Diagnosis not present

## 2024-07-06 DIAGNOSIS — R0789 Other chest pain: Secondary | ICD-10-CM | POA: Diagnosis present

## 2024-07-06 DIAGNOSIS — Z91040 Latex allergy status: Secondary | ICD-10-CM | POA: Insufficient documentation

## 2024-07-06 LAB — COMPREHENSIVE METABOLIC PANEL WITH GFR
ALT: 7 U/L (ref 0–44)
AST: 18 U/L (ref 15–41)
Albumin: 3.7 g/dL (ref 3.5–5.0)
Alkaline Phosphatase: 48 U/L (ref 38–126)
Anion gap: 12 (ref 5–15)
BUN: 28 mg/dL — ABNORMAL HIGH (ref 8–23)
CO2: 24 mmol/L (ref 22–32)
Calcium: 8.8 mg/dL — ABNORMAL LOW (ref 8.9–10.3)
Chloride: 106 mmol/L (ref 98–111)
Creatinine, Ser: 0.91 mg/dL (ref 0.44–1.00)
GFR, Estimated: >60 mL/min
Glucose, Bld: 95 mg/dL (ref 70–99)
Potassium: 4.1 mmol/L (ref 3.5–5.1)
Sodium: 141 mmol/L (ref 135–145)
Total Bilirubin: 0.6 mg/dL (ref 0.0–1.2)
Total Protein: 6.2 g/dL — ABNORMAL LOW (ref 6.5–8.1)

## 2024-07-06 LAB — CBC WITH DIFFERENTIAL/PLATELET
Abs Immature Granulocytes: 0.04 10*3/uL (ref 0.00–0.07)
Basophils Absolute: 0 10*3/uL (ref 0.0–0.1)
Basophils Relative: 0 %
Eosinophils Absolute: 0 10*3/uL (ref 0.0–0.5)
Eosinophils Relative: 0 %
HCT: 37.2 % (ref 36.0–46.0)
Hemoglobin: 12.3 g/dL (ref 12.0–15.0)
Immature Granulocytes: 1 %
Lymphocytes Relative: 15 %
Lymphs Abs: 1.2 10*3/uL (ref 0.7–4.0)
MCH: 33.6 pg (ref 26.0–34.0)
MCHC: 33.1 g/dL (ref 30.0–36.0)
MCV: 101.6 fL — ABNORMAL HIGH (ref 80.0–100.0)
Monocytes Absolute: 0.4 10*3/uL (ref 0.1–1.0)
Monocytes Relative: 5 %
Neutro Abs: 6.2 10*3/uL (ref 1.7–7.7)
Neutrophils Relative %: 79 %
Platelets: 190 10*3/uL (ref 150–400)
RBC: 3.66 MIL/uL — ABNORMAL LOW (ref 3.87–5.11)
RDW: 12.9 % (ref 11.5–15.5)
WBC: 7.9 10*3/uL (ref 4.0–10.5)
nRBC: 0 % (ref 0.0–0.2)

## 2024-07-06 LAB — I-STAT CHEM 8, ED
BUN: 29 mg/dL — ABNORMAL HIGH (ref 8–23)
Calcium, Ion: 1.12 mmol/L — ABNORMAL LOW (ref 1.15–1.40)
Chloride: 106 mmol/L (ref 98–111)
Creatinine, Ser: 1 mg/dL (ref 0.44–1.00)
Glucose, Bld: 94 mg/dL (ref 70–99)
HCT: 36 % (ref 36.0–46.0)
Hemoglobin: 12.2 g/dL (ref 12.0–15.0)
Potassium: 4 mmol/L (ref 3.5–5.1)
Sodium: 141 mmol/L (ref 135–145)
TCO2: 24 mmol/L (ref 22–32)

## 2024-07-06 LAB — TROPONIN T, HIGH SENSITIVITY
Troponin T High Sensitivity: 58 ng/L — ABNORMAL HIGH (ref 0–19)
Troponin T High Sensitivity: 68 ng/L — ABNORMAL HIGH (ref 0–19)

## 2024-07-06 NOTE — ED Notes (Signed)
 Patient given water and meal bag.

## 2024-07-06 NOTE — ED Notes (Signed)
Up to b/r, steady gait 

## 2024-07-06 NOTE — ED Notes (Addendum)
 EDP into room, at [facility]. Pt in NSR after EMS gave diltiazem  10mg  for afib RVR. Pt alert, NAD, calm, interactive, resps e/u, speaking in clear complete sentences. Skin W&D. Endorses taking eliquis  2.5mg  BID.

## 2024-07-06 NOTE — ED Notes (Signed)
 Patient is A&Ox4 upon discharge. Patient verbalized understanding of discharge instructions and follow-up care. Patient wheeled from ED. Patient awaiting ride home from son.

## 2024-07-06 NOTE — ED Provider Notes (Signed)
 " [facility] Provider Note   CSN: [id number] Arrival date & time: 07/06/24  1206     Patient presents with: No chief complaint on file.   Brittany Kirk is a 79 y.o. female.  {Add pertinent medical, surgical, social history, OB history to HPI:4467} 79 year old female with prior medical history as detailed below presents for evaluation.  Patient reports that approximately 730 this morning she experienced chest discomfort and associated palpitations consistent with episode of atrial fibrillation.  Patient has history of paroxysmal A-fib.  She is compliant with her Eliquis .  She takes 2.5 mg twice daily.  Her last dose of Eliquis  this morning.  Patient called EMS approximately half an hour prior to arrival.  Patient was noted to be in A-fib with RVR with a heart rate into the 140s.  10 mg of Cardizem  was given by EMS during transport.  On evaluation in the ED the patient's heart rate is now in the 50 to 60 bpm range.  She appears to be back in normal sinus rhythm.  Patient reports that she feels improved.  The history is provided by the patient and medical records.       Prior to Admission medications  Medication Sig Start Date End Date Taking? Authorizing Provider  acetaminophen  (TYLENOL ) 500 MG tablet Take 1,000 mg by mouth every 6 (six) hours as needed for moderate pain or headache.    [provider]  apixaban  (ELIQUIS ) 2.5 MG TABS tablet Take 1 tablet (2.5 mg total) by mouth 2 (two) times daily. 02/03/24   Court Dorn PARAS, MD  ascorbic acid  (VITAMIN C) 1000 MG tablet Take 1,000 mg by mouth daily.    [provider]  atorvastatin  (LIPITOR) 40 MG tablet Take 1 tablet (40 mg total) by mouth daily. 02/19/24   Duanne Butler DASEN, MD  cholecalciferol  (VITAMIN D3) 25 MCG (1000 UNIT) tablet Take 1,000 Units by mouth daily.    [provider]  cyanocobalamin  (VITAMIN B12) 1000 MCG tablet Take 1,000 mcg by mouth daily.     [provider]  diazepam  (VALIUM ) 5 MG tablet Take 5 mg by mouth as needed for sedation. 12/13/20   [provider]  ofloxacin  (FLOXIN ) 0.3 % OTIC solution Place 4 drops into the right ear 2 (two) times daily for 14 days. 07/07/24 07/21/24  Tobie Eldora NOVAK, MD  omeprazole (PRILOSEC) 20 MG capsule Take 20 mg by mouth daily.    [provider]  valsartan  (DIOVAN ) 40 MG tablet Take 1 tablet (40 mg total) by mouth daily. Hold for Blood Pressure <120 05/12/24   Court Dorn PARAS, MD    Allergies: Metronidazole, Penicillins, Fosamax  [alendronate ], Levofloxacin , Mexiletine, Trimethoprim, Cefaclor, Latex, Naproxen, Nsaids, Septra [bactrim], Sulfonamide derivatives, Tape, and Wound dressing adhesive    Review of Systems  All other systems reviewed and are negative.   Updated Vital Signs BP (!) 140/49   Pulse (!) 57   Resp (!) 24   Wt 53.5 kg   SpO2 100%   BMI 23.83 kg/m   Physical Exam Vitals and nursing note reviewed.  Constitutional:      General: She is not in acute distress.    Appearance: She is well-developed.  HENT:     Head: Normocephalic and atraumatic.  Eyes:     Conjunctiva/sclera: Conjunctivae normal.  Cardiovascular:     Rate and Rhythm: Normal rate and regular rhythm.     Heart sounds: No murmur heard. Pulmonary:  Effort: Pulmonary effort is normal. No respiratory distress.     Breath sounds: Normal breath sounds.  Abdominal:     Palpations: Abdomen is soft.     Tenderness: There is no abdominal tenderness.  Musculoskeletal:        General: No swelling.     Cervical back: Neck supple.  Skin:    General: Skin is warm and dry.     Capillary Refill: Capillary refill takes less than 2 seconds.  Neurological:     Mental Status: She is alert.  Psychiatric:        Mood and Affect: Mood normal.     (all labs ordered are listed, but only abnormal results are displayed) Labs Reviewed  CBC WITH DIFFERENTIAL/PLATELET  COMPREHENSIVE METABOLIC  PANEL WITH GFR  I-STAT CHEM 8, ED  TROPONIN T, HIGH SENSITIVITY    EKG: EKG Interpretation Date/Time:  Monday July 06 2024 12:25:53 EST Ventricular Rate:  55 PR Interval:  142 QRS Duration:  100 QT Interval:  435 QTC Calculation: 416 R Axis:   37  Text Interpretation: Sinus rhythm Low voltage, precordial leads Confirmed by Laurice Coy [phone]) on 07/06/2024 12:36:30 PM  Radiology: No results found.  {Document cardiac monitor, telemetry assessment procedure when appropriate:32947} Procedures   Medications Ordered in the ED - No data to display    {Click here for ABCD2, HEART and other calculators REFRESH Note before signing:1}                              Medical Decision Making Amount and/or Complexity of Data Reviewed Labs: ordered. Radiology: ordered.   ***  {Document critical care time when appropriate  Document review of labs and clinical decision tools ie [id number], etc  Document your independent review of radiology images and any outside records  Document your discussion with family members, caretakers and with consultants  Document social determinants of health affecting pt's care  Document your decision making why or why not admission, treatments were needed:32947:::1}   Final diagnoses:  None    ED Discharge Orders     None        "

## 2024-07-06 NOTE — ED Triage Notes (Signed)
 Pt BIB [facility] from home, woke up this morning with chest pain, palpitations and SHOB. Lungs clear and on 4L at 99%. Hx of a fib had an ablation 3 years ago and has been in a fib since. A fib RVR 120-140.   10 mg of Cardizem , HR down to 70-110 en route, 250 mL NS bolus, 20 L AC   VS 150/90 initial, after Cardizem  114/60, 96% RA

## 2024-07-07 ENCOUNTER — Ambulatory Visit: Admitting: Family Medicine

## 2024-07-08 ENCOUNTER — Ambulatory Visit: Admitting: Cardiovascular Disease

## 2024-07-13 ENCOUNTER — Ambulatory Visit: Admitting: Family Medicine

## 2024-07-15 ENCOUNTER — Encounter: Payer: Self-pay | Admitting: Cardiovascular Disease

## 2024-07-15 ENCOUNTER — Ambulatory Visit: Admitting: Cardiovascular Disease

## 2024-07-17 NOTE — Telephone Encounter (Signed)
 Tylenol  Arthritis provides better relief for the patients arthritis pain, and they may take two tablets up to three times per day as needed. They should be cautioned not to use any other medications that contain acetaminophen  to avoid accidental overdose, and the total daily acetaminophen  intake should not exceed 3000 mg per day.  ALONSO Blanch , [facility] 07/15/24 2:224

## 2024-08-06 ENCOUNTER — Ambulatory Visit (INDEPENDENT_AMBULATORY_CARE_PROVIDER_SITE_OTHER): Admitting: Otolaryngology

## 2024-09-04 ENCOUNTER — Ambulatory Visit: Admitting: Physician Assistant

## 2024-10-19 ENCOUNTER — Inpatient Hospital Stay: Admitting: Oncology

## 2024-10-19 ENCOUNTER — Inpatient Hospital Stay
